# Patient Record
Sex: Female | Born: 1985
Health system: Southern US, Community
[De-identification: ages and names within clinical notes are randomized; demographics above are authoritative.]

## PROBLEM LIST (undated history)

## (undated) DIAGNOSIS — R202 Paresthesia of skin: Secondary | ICD-10-CM

## (undated) DIAGNOSIS — M329 Systemic lupus erythematosus, unspecified: Secondary | ICD-10-CM

## (undated) DIAGNOSIS — R200 Anesthesia of skin: Secondary | ICD-10-CM

## (undated) DIAGNOSIS — R51 Headache: Secondary | ICD-10-CM

## (undated) DIAGNOSIS — D069 Carcinoma in situ of cervix, unspecified: Secondary | ICD-10-CM

## (undated) DIAGNOSIS — K219 Gastro-esophageal reflux disease without esophagitis: Secondary | ICD-10-CM

## (undated) DIAGNOSIS — IMO0002 Reserved for concepts with insufficient information to code with codable children: Secondary | ICD-10-CM

## (undated) DIAGNOSIS — K8020 Calculus of gallbladder without cholecystitis without obstruction: Secondary | ICD-10-CM

## (undated) DIAGNOSIS — G35 Multiple sclerosis: Secondary | ICD-10-CM

## (undated) HISTORY — DX: Paresthesia of skin: R20.2

## (undated) HISTORY — DX: Reserved for concepts with insufficient information to code with codable children: IMO0002

## (undated) HISTORY — DX: Systemic lupus erythematosus, unspecified: M32.9

## (undated) HISTORY — DX: Anesthesia of skin: R20.0

## (undated) HISTORY — DX: Anesthesia of skin: R20.2

## (undated) HISTORY — DX: Headache: R51

---

## 2006-10-13 ENCOUNTER — Emergency Department (HOSPITAL_COMMUNITY): Admission: EM | Admit: 2006-10-13 | Discharge: 2006-10-13 | Payer: Self-pay | Admitting: Emergency Medicine

## 2006-10-15 ENCOUNTER — Emergency Department (HOSPITAL_COMMUNITY): Admission: EM | Admit: 2006-10-15 | Discharge: 2006-10-15 | Payer: Self-pay | Admitting: Emergency Medicine

## 2008-03-30 ENCOUNTER — Ambulatory Visit: Payer: Self-pay | Admitting: Family Medicine

## 2008-04-13 ENCOUNTER — Ambulatory Visit: Payer: Self-pay | Admitting: Family Medicine

## 2008-04-13 ENCOUNTER — Encounter: Payer: Self-pay | Admitting: Family Medicine

## 2008-04-13 DIAGNOSIS — R5383 Other fatigue: Secondary | ICD-10-CM

## 2008-04-13 DIAGNOSIS — L732 Hidradenitis suppurativa: Secondary | ICD-10-CM | POA: Insufficient documentation

## 2008-04-13 DIAGNOSIS — R5381 Other malaise: Secondary | ICD-10-CM | POA: Insufficient documentation

## 2008-04-17 LAB — CONVERTED CEMR LAB
BUN: 10 mg/dL (ref 6–23)
CO2: 23 meq/L (ref 19–32)
Calcium: 9.4 mg/dL (ref 8.4–10.5)
Creatinine, Ser: 0.7 mg/dL (ref 0.40–1.20)
Glucose, Bld: 97 mg/dL (ref 70–99)
HCT: 39 % (ref 36.0–46.0)
Hemoglobin: 12.8 g/dL (ref 12.0–15.0)
MCV: 85.5 fL (ref 78.0–100.0)
RBC: 4.56 M/uL (ref 3.87–5.11)
WBC: 7.4 10*3/uL (ref 4.0–10.5)

## 2009-06-25 ENCOUNTER — Emergency Department (HOSPITAL_COMMUNITY): Admission: EM | Admit: 2009-06-25 | Discharge: 2009-06-25 | Payer: Self-pay | Admitting: Emergency Medicine

## 2009-08-03 ENCOUNTER — Ambulatory Visit: Payer: Self-pay | Admitting: Family Medicine

## 2009-08-03 ENCOUNTER — Encounter: Payer: Self-pay | Admitting: Family Medicine

## 2009-08-03 DIAGNOSIS — M62838 Other muscle spasm: Secondary | ICD-10-CM | POA: Insufficient documentation

## 2009-08-08 LAB — CONVERTED CEMR LAB
AST: 13 units/L (ref 0–37)
Alkaline Phosphatase: 69 units/L (ref 39–117)
BUN: 6 mg/dL (ref 6–23)
Creatinine, Ser: 0.64 mg/dL (ref 0.40–1.20)
Folate: 10.5 ng/mL
Glucose, Bld: 97 mg/dL (ref 70–99)
Vitamin B-12: 300 pg/mL (ref 211–911)

## 2009-08-10 ENCOUNTER — Ambulatory Visit (HOSPITAL_COMMUNITY): Admission: RE | Admit: 2009-08-10 | Discharge: 2009-08-10 | Payer: Self-pay | Admitting: Gastroenterology

## 2009-08-13 ENCOUNTER — Encounter: Payer: Self-pay | Admitting: Family Medicine

## 2009-08-14 ENCOUNTER — Telehealth: Payer: Self-pay | Admitting: Family Medicine

## 2009-08-16 ENCOUNTER — Telehealth: Payer: Self-pay | Admitting: Family Medicine

## 2009-08-17 ENCOUNTER — Ambulatory Visit: Payer: Self-pay | Admitting: Family Medicine

## 2009-10-10 ENCOUNTER — Encounter: Payer: Self-pay | Admitting: Family Medicine

## 2009-10-10 ENCOUNTER — Ambulatory Visit: Payer: Self-pay | Admitting: Family Medicine

## 2009-10-10 LAB — CONVERTED CEMR LAB
ABO/RH(D): O POS
Basophils Absolute: 0 10*3/uL (ref 0.0–0.1)
Eosinophils Relative: 1 % (ref 0–5)
HCT: 34.3 % — ABNORMAL LOW (ref 36.0–46.0)
Hemoglobin: 11.6 g/dL — ABNORMAL LOW (ref 12.0–15.0)
Hepatitis B Surface Ag: NEGATIVE
Lymphocytes Relative: 32 % (ref 12–46)
Lymphs Abs: 1.9 10*3/uL (ref 0.7–4.0)
Neutro Abs: 3.3 10*3/uL (ref 1.7–7.7)
Platelets: 320 10*3/uL (ref 150–400)
Rh Type: POSITIVE
Rubella: 20.2 intl units/mL — ABNORMAL HIGH
WBC: 5.8 10*3/uL (ref 4.0–10.5)

## 2009-10-22 ENCOUNTER — Encounter: Payer: Self-pay | Admitting: Family Medicine

## 2009-10-22 ENCOUNTER — Ambulatory Visit: Payer: Self-pay | Admitting: Family Medicine

## 2009-10-22 LAB — CONVERTED CEMR LAB: GC Probe Amp, Genital: NEGATIVE

## 2009-10-24 ENCOUNTER — Encounter: Payer: Self-pay | Admitting: Family Medicine

## 2009-10-24 LAB — CONVERTED CEMR LAB

## 2009-10-25 ENCOUNTER — Ambulatory Visit: Payer: Self-pay | Admitting: Family Medicine

## 2009-11-01 ENCOUNTER — Ambulatory Visit (HOSPITAL_COMMUNITY): Admission: RE | Admit: 2009-11-01 | Discharge: 2009-11-01 | Payer: Self-pay | Admitting: Family Medicine

## 2009-11-01 ENCOUNTER — Encounter: Payer: Self-pay | Admitting: Family Medicine

## 2009-11-20 ENCOUNTER — Ambulatory Visit: Payer: Self-pay | Admitting: Family Medicine

## 2009-11-22 ENCOUNTER — Encounter: Payer: Self-pay | Admitting: Family Medicine

## 2009-12-19 ENCOUNTER — Encounter: Payer: Self-pay | Admitting: Family Medicine

## 2009-12-19 ENCOUNTER — Ambulatory Visit: Payer: Self-pay | Admitting: Family Medicine

## 2009-12-25 ENCOUNTER — Encounter: Payer: Self-pay | Admitting: Family Medicine

## 2009-12-25 ENCOUNTER — Ambulatory Visit (HOSPITAL_COMMUNITY): Admission: RE | Admit: 2009-12-25 | Discharge: 2009-12-25 | Payer: Self-pay | Admitting: Family Medicine

## 2010-01-16 ENCOUNTER — Telehealth (INDEPENDENT_AMBULATORY_CARE_PROVIDER_SITE_OTHER): Payer: Self-pay | Admitting: *Deleted

## 2010-01-18 ENCOUNTER — Ambulatory Visit: Payer: Self-pay | Admitting: Family Medicine

## 2010-01-18 LAB — CONVERTED CEMR LAB
Glucose, Urine, Semiquant: NEGATIVE
Protein, U semiquant: 30

## 2010-02-21 ENCOUNTER — Ambulatory Visit: Admission: RE | Admit: 2010-02-21 | Discharge: 2010-02-21 | Payer: Self-pay | Source: Home / Self Care

## 2010-02-21 ENCOUNTER — Encounter: Payer: Self-pay | Admitting: Family Medicine

## 2010-02-21 LAB — CONVERTED CEMR LAB
HIV: NONREACTIVE
MCV: 85.6 fL (ref 78.0–100.0)
Platelets: 237 10*3/uL (ref 150–400)
RBC: 3.83 M/uL — ABNORMAL LOW (ref 3.87–5.11)
WBC: 6.6 10*3/uL (ref 4.0–10.5)

## 2010-02-22 ENCOUNTER — Telehealth: Payer: Self-pay | Admitting: Family Medicine

## 2010-02-24 ENCOUNTER — Encounter: Payer: Self-pay | Admitting: Family Medicine

## 2010-03-05 NOTE — Letter (Signed)
Summary: Results Follow-up Letter  [facility]  [address]   [place], [place] [address]   Phone: [phone]  Fax: [phone]    10/24/2009  [address] [place], [place]  [address]  Dear Ms. [place],   The following are the results of your recent test(s):  Your Pap smear showed some mildly abnormal cells on your cervix. This does NOT affect your pregnancy, but we should follow up with colposcopy (looking at your cervix with a special camera) 6 weeks after you have your baby.    Sincerely,  Bobby Rumpf  MD [facility]           Appended Document: Results Follow-up Letter mailed.

## 2010-03-05 NOTE — Progress Notes (Signed)
Summary: phnmsg  Phone Note Call from Patient Call back at Home Phone [phone]   Caller: Patient Summary of Call: pt is returning Dr Jeanice Lim call Initial call taken by: De Nurse,  August 14, 2009 1:43 PM  Follow-up for Phone Call        Called patient back. Will have patient come in to be seen tomorrow at regularly scheduled appointment.  Follow-up by: Bobby Rumpf  MD,  August 16, 2009 9:37 AM

## 2010-03-05 NOTE — Progress Notes (Signed)
Summary: Confirmation of appointment at [facility]  Phone Note From Other Clinic   Summary of Call: Appointment confirmation for Emily Hicks 09/07/09 to be seen by Dr. Terrace Arabia

## 2010-03-05 NOTE — Miscellaneous (Signed)
Summary: Order for Neurology Referral

## 2010-03-05 NOTE — Assessment & Plan Note (Signed)
Summary: NOB/DSL   Vital Signs:  Patient profile:   25 year old female LMP:     07/30/2009 Height:      61.75 inches Weight:      183 pounds BMI:     33.86 BSA:     1.84 Temp:     98.5 degrees F Pulse rate:   101 / minute BP sitting:   132 / 81  Vitals Entered By: Jone Baseman CMA (October 22, 2009 1:42 PM) CC: NOB LMP (date): 07/30/2009 EDC by LMP==> 05/06/2010 EDC 05/06/2010 LMP - Character: normal LMP - Reliable? Yes Menarche (age onset years): 14   Menses interval (days): 21  Menstrual flow (days): 4  On BCP's at conception: no Enter LMP: 07/30/2009   CC:  NOB.  Habits & Providers  Alcohol-Tobacco-Diet     Cigarette Packs/Day: n/a  Allergies: No Known Drug Allergies  Social History: Hepatitis Risk:  no Packs/Day:  n/a  Physical Exam  General:  NAD, pleasant, obese Eyes:  pupils equal, round and reactive to light , extraoccular movements intact , normal fundi on limited exam  Mouth:  Oral mucosa and oropharynx without lesions or exudates.  Teeth in good repair. Neck:  no masses Lungs:  Normal respiratory effort, chest expands symmetrically. Lungs are clear to auscultation, no crackles or wheezes. Heart:  Normal rate and regular rhythm. S1 and S2 normal without gallop, murmur, click, rub or other extra sounds. Abdomen:  Bowel sounds positive,abdomen soft and non-tender without masses, organomegaly or hernias noted. Msk:  5/5 strength bilateral upper and lower extremities  Extremities:  no edema  Neurologic:  alert & oriented X3, cranial nerves II-XII intact, strength normal in all extremities, sensation intact to light touch, and gait normal.   2+ patellar on left, 1+ on right, no clonus  finger to nose normal Rapid alternating movements normal  Psych:  Oriented X3, memory intact for recent and remote, normally interactive, good eye contact, not anxious appearing, not depressed appearing, and not agitated.    PHQ9 score = 2   Impression &  Recommendations:  Problem # 1:  SUPERVISION OF NORMAL FIRST PREGNANCY (ICD-V22.0) Assessment New G1P0 at 12.0 weeks by certain LMP.   1) U/S for dates  2) 1 hr GTT early - obesity and AAF 3) Pap today 4) GC/CT today 5) PHQ9 score = 2; no safety issues identified; father of baby here at appointment and highly involved  6) Advised regarding breast feeding 7) Contemplating plan for contraception post partum  8) Quad screen at 16 weeks 9) U/S for anatomy 18-20 weeks 10) Addressed normal amount of weight gain (20 lbs given obesity) 11) Labs as below. 12) Follow up 4 weeks (precepted with Dr. Leveda Anna - will route to Dr. Mauricio Po) 13) Script for PNV given   PRENATAL LABS:  HIV = negative RPR = negative  Hgb = 11.6  Platelets =  Sickle cell screen = negative  ABO / Rh = O positive  Antibody screen = negative  GC / Chlamydia = pending  1 hr GTT = pending  GBS = pending  Rubella = immune  HepBsAg = negative   Orders: Prenatal U/S < 14 weeks - 69629  (Prenatal U/S) Other OB visit- [facility] (OBCK)  Problem # 2:  MUSCLE SPASM (ICD-728.85) Assessment: Improved Uncertain etiology. MRI findings, symptomatology concerning for multiple sclerosis (vs. other items listed on differential). Awaiting office visit notes from Neurology. Appreciate Dr. Zannie Cove involvement in management of this patient.  Will hold off on  further work up for now from my standpoint given resolution of symptoms at this time and pregnancy.   Orders: Sed Rate (ESR)-[facility] [id number]) [facility]- Est  Level 4 (99214)  Other Orders: GC/Chlamydia-[facility] ([id number]) Pap Smear-[facility] ([id number]) Future Orders: Glucose 1 hr-[facility] (91478) ... 10/24/2010  Patient Instructions: 1)  It was great to see you today!  2)  Congratulations! 3)  Follow up in 4 weeks. 4)  We will check an ultrasound for dates. 5)  We will check your bloog sugars today to check for gestational diabetes.  6)  Eat 4-5 small meals per day to help with nausea  7)  I will  check your notes from Neurology to see what their plan is. 8)  We can hold off on working up the muscle spasms any further during your pregnancy. 9)  TUMS is safe to take during pregnancy for heartburn and Tylenol / acetaminophen is safe to take for pain  10)  Think about plans for birth control and whether you plan on breastfeeding (I recommend that you breastfeed) 11)  IF YOU NOTICE ANY BLEEDING OR CONTRACTIONS - GO STRAIGHT TO [facility]   Prenatal Visit    FOB name: Clois Dupes [facility] Confirmation:    New working [facility]: 05/06/2010    LMP reliable? 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 : Yes    Menarche: 14 years    Menses interval: 21  days    Menstrual flow 4  days    On BCP's at conception: no    Symptoms since LMP: amenorrhea, nausea, vomiting, fatigue, irritability, urinary frequency    Other symptoms: once a day emesis   Past Pregnancy History    Gravida:     1    Term Births:     0    Premature Births:   0    Living Children:   0    Para:       0    Mult. Births:     0    Prev C-Section:   0    Prev. VBAC attempt?   none    Aborta:     0    Elect. Ab:     0    Spont. Ab:     0    Ectopics:     0   Genetic History    Genetic History Reviewed:    Father of baby:   Kadeem McGriff     Thalassemia:     mother: no   father: no    Neural tube defect:   mother: no   father: no    Down's Syndrome:   mother: no   father: no    Tay-Sachs:     mother:  no   father: no    Sickle Cell Dz/Trait:  mother: no   father: no    Hemophilia:     mother: no   father: no    Muscular Dystrophy:   mother: no   father: no    Cystic Fibrosis:   mother: no   father: no    Huntington's Dz:   mother: no   father: no    Mental Retardation:   mother: no   father: no    Fragile X:     mother: no   father: no    Other Genetic or       Chromosomal Dz:   mother: no   father: no    Child with other       birth defect:     mother: no   father: no    > 3 spont. abortions:   mother: no    Hx of stillbirth:     mother: no  Infection Risk History    High Risk Hepatitis B: no    Immunized against Hepatitis B: no    Exposure to TB: no    Patient with history of Genital Herpes: no    Sexual partner with history of Genital Herpes: no    History of STD (GC, Chlamydia, Syphilis, HPV): no    Rash, Viral, or Febrile Illness since LMP: no    Exposure to Cat Litter: no    Chicken Pox Immune Status: Hx of Disease: Immune    History of Parvovirus (Fifth Disease): no    Occupational Exposure to Children: none  Environmental Exposures    Xray Exposure since LMP: no    Chemical or other exposure: no    Medication, drug, or alcohol use since LMP: no   Flowsheet View for Follow-up Visit    Estimated weeks of       gestation:     12 0/7    Weight:     183    Blood pressure:   132 / 81    Headache:     few headaches     Nausea/vomiting:   vom1/d    Edema:     0    Vaginal bleeding:   no    Vaginal discharge:   clear     Fundal height:      < 20 weeks     FHR:       unable to detect     Fetal activity:     N/A    Labor symptoms:   no    Taking prenatal vits?   Y    Smoking:     n/a    Next visit:     4 wk    Resident:     Wallene Huh    Preceptor:     Hensel     Appended Document: NOB/DSL 122/80 repeat BP performed by Jone Baseman

## 2010-03-05 NOTE — Assessment & Plan Note (Signed)
Summary: ob   Flowsheet View for Follow-up Visit    Estimated weeks of       gestation:     19 1/7    Weight:     189    Blood pressure:   137 / 79    Hx headache?     No    Nausea/vomiting?   occasional     Edema?     0    Bleeding?     no    Leakage/discharge?   no    Fetal activity:       occasional flutters    Labor symptoms?   no    Fundal height:       ~ 20 wks    FHR:       140-150    Taking Vitamins?   Y    Smoking PPD:   n/a    Next visit:     4 wk    Resident:     Wallene Huh  Physical Examination  Vital Signs:  P: 95  BP (upright): 137/79  Wt: 189  Last Ht: 61.75 (10/22/2009)  Impression & Recommendations:  Problem # 1:  SUPERVISION OF NORMAL FIRST PREGNANCY (ICD-V22.0) Assessment Unchanged  G1P0 at 19.1 weeks by u/s at 12 weeks 2 days   1) U/S for dates results entered - new EDC of 05/14/10 assigned, EGA [redacted]w[redacted]d today (8 day discrepancy w/ LMP); o/w normal u/s  2) 1 hr GTT early = normal (130) 3) Pap with LSIL - follow 6 weeks pospartum with colposcopy (letter sent) 4) GC/CT NEGATIVE / NEGATIVE 5) PHQ9 score = 2; no safety issues identified; father of baby here at appointment and highly involved  6) Advised regarding breast feeding 7) Contemplating plan for contraception post partum  8) Quad screen today 9) U/S for anatomy 18-20 weeks - will schedule this visit 10) Addressed normal amount of weight gain (20 lbs given obesity) 11) Labs as below. 12) Follow up 4 weeks 13) PNV 14) Quad screen negative    PRENATAL LABS:  HIV = negative RPR = negative  Hgb = 11.6  Platelets =  Sickle cell screen = negative  ABO / Rh = O positive  Antibody screen = negative  GC / Chlamydia = negative / negative 1 hr GTT = 130  GBS = pending  Rubella = immune  HepBsAg = negative   Orders: Prenatal U/S > 14 weeks - 16109 (Prenatal U/S) Other OB visit- [facility] (OBCK)  Patient Instructions: 1)  It was great to see you today!  2)  Congratulations! Your new due date is  05/13/09 3)  Follow up in 4 weeks. 4)  We will schedule you for an ultrasound to get a look at your baby (you will hopefully be able to find out the gender!) 5)  Eat 4-5 small meals per day to help with nausea  6)  We can hold off on working up the muscle spasms any further during your pregnancy. 7)  TUMS is safe to take during pregnancy for heartburn and Tylenol / acetaminophen is safe to take for pain. Over the counter fiber supplements can help with constipation.  8)  Think about plans for birth control and whether you plan on breastfeeding (I recommend that you breastfeed) 9)  IF YOU NOTICE ANY BLEEDING OR CONTRACTIONS - GO STRAIGHT TO [facility]   Flowsheet View for Follow-up Visit    Estimated weeks of       gestation:  19 1/7    Weight:     189    Blood pressure:   137 / 79    Headache:     No    Nausea/vomiting:   occasional     Edema:     0    Vaginal bleeding:   no    Vaginal discharge:   no    Fundal height:       ~ 20 wks    FHR:       140-150    Fetal activity:     occasional flutters    Labor symptoms:   no    Taking prenatal vits?   Y    Smoking:     n/a    Next visit:     4 wk    Resident:     Wallene Huh   Vital Signs:  Patient profile:   25 year old female Weight:      189 pounds Pulse rate:   95 / minute BP sitting:   137 / 79  Vitals Entered By: Garen Grams LPN (December 19, 2009 10:57 AM)

## 2010-03-05 NOTE — Assessment & Plan Note (Signed)
Summary: f/u eo   Vital Signs:  Patient profile:   25 year old female Height:      61.75 inches Weight:      196 pounds BMI:     36.27 Temp:     98.3 degrees F oral Pulse rate:   94 / minute BP sitting:   118 / 91  (left arm) Cuff size:   regular  Vitals Entered By: Garen Grams LPN (August 17, 2009 9:45 AM) CC: f/u mri Is Patient Diabetic? No Pain Assessment Patient in pain? no        Primary Care Provider:  Bobby Rumpf  MD  CC:  f/u mri.  History of Present Illness: 1) Left sided "spasms": Patient continues to have left sided spasms x 2 months, as well as left leg parathesias and left facial weakness; these episodes are increasing in frequency to about 4-5 per day since the last time she saw me. MRI has demostrated scattered foci of abnormal FLAIR and T2 signal within the cerebral hemispheric white matter concerning for an early manifestation of multiple sclerosis.  vs small vessel infarctions due to vasculitis vs migraine related foci vs post-traumatic foci.  Started acutely without inciting event. She continues to have tingling and burning sensation in left thigh as well as "hand, fingers and toes locking up on left side" and "spasms in left thigh and left arm". Muscles feels sore after "locking up". Thinks that they might be temporally related to increased activity as they tend to occur after - "walking fast, typing fast", though can occur at rest as well. Continues to have headaches as well, though not always temporally related to her other symptoms. Denies vision change, increased stress, depressive symptoms, drug use, bowel or bladder changes, weakness, loss of consciousness, trauma, migraine history, family history of seizure disorder, neurological disorder, muscle disorder, autoimmune disorder  Habits & Providers  Alcohol-Tobacco-Diet     Tobacco Status: never  Current Medications (verified): 1)  None  Allergies (verified): No Known Drug Allergies  Review of  Systems       as per HPI o/w negative   Physical Exam  General:  NAD, pleasant, obese Eyes:  pupils equal, round and reactive to light , extraoccular movements intact , normal fundi on limited exam  Neck:  no masses Msk:  5/5 strength bilateral upper and lower extremities  Neurologic:  alert & oriented X3, cranial nerves II-XII intact, strength normal in all extremities, sensation intact to light touch, and gait normal.   2+ patellar on left, 1+ on right, no clonus  finger to nose normal Rapid alternating movements normal    Impression & Recommendations:  Problem # 1:  MUSCLE SPASM (ICD-728.85) Assessment Unchanged Uncertain etiology. MRI findings, symptomatology concerning for multiple sclerosis (vs other items on differential listed above). Discussed this with patient but advised her that we do not have a definitive diagnosis at this time and that we will have her see the neurologist to further evaluate these issues. Appreciate Dr. Zannie Cove involvement in the care of this patient.  Will check ESR today w/ vasculitis on differential for MRI. No spasticity noted on exam (though relatively hyper-reflexic left lower extremity as compared to right). Would consider Baclofen if spasms continue to be an issue. I reviewed the MRI findings myself as well (MRI of spine could not be performed at this time without neurology referral due to insurance issues.). total time spent with patient > 25 minutes reviewing MRI findings, differential diagnosis, plan.   Orders:  Sed Rate (ESR)-[facility] [id number]) [facility]- Est  Level 4 (13244)  Patient Instructions: 1)  It was great to see you today!  2)  Follow up in 1 month after you are seen by Neurologist. 3)  Your appointment with with [facility] on 09/07/09 at 10:25 AM with Dr. Terrace Arabia.  4)  Please keep this appointment so we can get to the bottom of what is causing these spasms  Laboratory Results   Blood Tests   Date/Time Received: August 17, 2009  10:28 AM  Date/Time Reported: August 17, 2009 11:54 AM   SED rate: 51 mm/hr  Comments: ...........test performed by...........Marland KitchenTerese Door, CMA      Appended Document: f/u eo fax to  Dr. Terrace Arabia of [facility]

## 2010-03-05 NOTE — Assessment & Plan Note (Signed)
Summary: OB 15.0 weeks/KH   Vital Signs:  Patient profile:   25 year old female Weight:      183.6 pounds Pulse rate:   93 / minute BP sitting:   128 / 72  Vitals Entered By: Garen Grams LPN (November 20, 2009 9:02 AM)  Habits & Providers  Alcohol-Tobacco-Diet     Cigarette Packs/Day: n/a  Allergies: No Known Drug Allergies   Impression & Recommendations:  Problem # 1:  SUPERVISION OF NORMAL FIRST PREGNANCY (ICD-V22.0)  Orders: AFP/Quad Scr-[facility] ([id number]) Other OB visit- [facility] (OBCK)  G1P0 at 15.0 weeks by u/s at 12 weeks 2 days   1) U/S for dates results entered - new EDC of 05/14/10 assigned, EGA [redacted]w[redacted]d today (8 day discrepancy w/ LMP); o/w normal u/s  2) 1 hr GTT early = normal 3) Pap with LSIL - follow 6 weeks pospartum with colposcopy (letter sent) 4) GC/CT NEGATIVE / NEGATIVE 5) PHQ9 score = 2; no safety issues identified; father of baby here at appointment and highly involved  6) Advised regarding breast feeding 7) Contemplating plan for contraception post partum  8) Quad screen today 9) U/S for anatomy 18-20 weeks 10) Addressed normal amount of weight gain (20 lbs given obesity) 11) Labs as below. 12) Follow up 4 weeks 13) PNV   PRENATAL LABS:  HIV = negative RPR = negative  Hgb = 11.6  Platelets =  Sickle cell screen = negative  ABO / Rh = O positive  Antibody screen = negative  GC / Chlamydia = negative / negative 1 hr GTT = 130  GBS = pending  Rubella = immune  HepBsAg = negative   Problem # 2:  MUSCLE SPASM (ICD-728.85) Assessment: Improved Uncertain etiology. MRI findings, symptomatology concerning for multiple sclerosis (vs. other items listed on differential). Awaiting office visit notes from Neurology. Appreciate Dr. Zannie Cove involvement in management of this patient.  Will hold off on further work up for now from my standpoint given resolution of symptoms at this time and pregnancy.   Patient Instructions: 1)  It was great to see you today!   2)  Congratulations! Your new due date is 05/13/09 3)  Follow up in 4 weeks. 4)  We will check an ultrasound in three to four weeks 5)  Eat 4-5 small meals per day to help with nausea  6)  We can hold off on working up the muscle spasms any further during your pregnancy. 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    Number of children at home: 0    Hospital of delivery: [facility]   FOB Information    Husband/Father of baby: Clois Dupes    FOB occupation Office clerk   Menstrual History    LMP (date): 07/30/2009    LMP - Character: normal    Menarche: 14 years    Menses interval: 21  days  Menstrual flow 4  days    On BCP's at conception: no   Flowsheet View for Follow-up Visit    Estimated weeks of       gestation:     15 0/7    Weight:     183.6    Blood pressure:   128 / 72    Headache:     occasional    Nausea/vomiting:   No    Edema:     0    Vaginal bleeding:   no    Vaginal discharge:   clear     Fundal height:      < 20 weeks    FHR:       150-160    Fetal activity:     N/A    Labor symptoms:   no    Taking prenatal vits?   Y    Smoking:     n/a    Next visit:     4 wk    Resident:     Emmit [facility]   Ultrasound Date:     11/01/2009   Indication:       Uncert dates   Adnexa by [place]:     Normal         Fetal number:       1              Fetal anomalies:     None noted     Placenta previa?     No           Measurements:    Crown-Rump Length (mm):   58.8         Dating:   Gest age by [place]:     12W 2D         EDC by [place] Gest age:     05/14/2010   Impression:   Impression:        Single living intrauterine pregnancy demonstrating an EGA by  CRL of 12w 2d. This is behind expected EGA by LMP of  13w  3d and suggests best dating by today's exam. Best Working [facility]:    05/14/2010

## 2010-03-05 NOTE — Assessment & Plan Note (Signed)
Summary: spasms on side,tcb   Vital Signs:  Patient profile:   25 year old female Height:      61.75 inches Weight:      196.1 pounds BMI:     36.29 Temp:     97.9 degrees F oral Pulse rate:   97 / minute BP sitting:   125 / 82  (left arm) Cuff size:   regular  Vitals Entered By: Garen Grams LPN (August 03, 5364 9:42 AM) CC: frequent muscle spasms on left side Is Patient Diabetic? No Pain Assessment Patient in pain? no        Primary Care Provider:  Bobby Rumpf  MD  CC:  frequent muscle spasms on left side.  History of Present Illness: 1) Left sided "spasms": Patient reports left sided spasms x 2 months. Started acutely without inciting event. Starts off with tingling and burning sensation in left thigh, progresses to "hand, fingers and toes locking up on left side" and "spasms in left thigh and left arm". Increasing in frequency - intially every 2-3 days now occuring three times a day, every day. Symptoms progressing to left side of mouth as well. Muscles feels sore after "locking up". Sometimes brought on with increased activity - "walking fast, typing fast". Also reports headaches this week - sometimes associated with these symptoms. Denies vision change, increased stress, depressive symptoms, drug use, bowel or bladder changes, weakness, loss of consciousness, trauma, family history of seizure disorder, neurological disorder, muscle disorder. Seen at [facility] ER in interim, normal Upreg, ISTAT - diagnosed with muscle spasms - given Flexeril which did not help.   Habits & Providers  Alcohol-Tobacco-Diet     Tobacco Status: never  Current Medications (verified): 1)  None  Allergies (verified): No Known Drug Allergies  Past History:  Family History: Last updated: 03/30/2008 Family History Hypertension - mother age 36 Diabetes- grandparents age- 109  Social History: Last updated: 03/30/2008 Moved to [place] 2 years ago for her job.  She works as an Airline pilot at  [employer].  She lives with her 2 brothers Caryn Bee and Jess Barters.  She participates in church activities for fun.  Risk Factors: Exercise: yes (03/30/2008)  Risk Factors: Smoking Status: never (08/03/2009)  Review of Systems       also positive for mild hair thinning, dry skin, weight gain since last visit o/w negative except as per HPI   Physical Exam  General:  NAD, pleasant, obese Eyes:  pupils equal, round and reactive to light , extraoccular movements intact , normal fundi on limited exam  Neck:  no masses Lungs:  Normal respiratory effort, chest expands symmetrically. Lungs are clear to auscultation, no crackles or wheezes. Heart:  Normal rate and regular rhythm. S1 and S2 normal without gallop, murmur, click, rub or other extra sounds. Msk:  5/5 strength bilateral upper and lower extremities  Extremities:  no edema  Neurologic:  alert & oriented X3, cranial nerves II-XII intact, strength normal in all extremities, sensation intact to light touch, and gait normal.   2+ patellar on left, 1+ on right  finger to nose normal Rapid alternating movements normal Unable to blink rapidly - eyelids twitch and flutter slightly    Impression & Recommendations:  Problem # 1:  MUSCLE SPASM (ICD-728.85) Assessment New Uncertain etiology. Will check labs as below. Check MRI brain, spinal cord with concern for progressive apparently neurological symptoms in young female. Also consider psychological etiology - PHQ9 test score = 2, patient denies stress,  appears to have good insight. Does not appear to be NMJ disorder. Will follow in two weeks. Consider neurology referral if imaging, labs do not reveal cause. Counseled regarding driving - if symptoms continue to progress, patient is to stop.   Orders: Comp Met-[facility] [phone]) B12-[facility] [phone]) Folate-[facility] [phone]) TSH-[facility] [phone]) MRI with & without Contrast (MRI w&w/o Contrast) [facility]- Est  Level 4  (28413)  Patient Instructions: 1)  Follow up in two weeks. 2)  If you notice your spasms become worse and interfere with driving, you should stop driving  Appended Document: spasms on side,tcb   Appended Document: spasms on side,tcb Will refer to Neurology for evaluation of this 25 year old female with 2 month history of left sided muscle spasm, left sided parasthesias, MRI concerning for early multiple sclerosis vs (less likely vasculitis vs migraine).

## 2010-03-07 ENCOUNTER — Encounter (INDEPENDENT_AMBULATORY_CARE_PROVIDER_SITE_OTHER): Payer: 59 | Admitting: Family Medicine

## 2010-03-07 ENCOUNTER — Ambulatory Visit: Admit: 2010-03-07 | Payer: Self-pay

## 2010-03-07 ENCOUNTER — Encounter: Payer: Self-pay | Admitting: Family Medicine

## 2010-03-07 DIAGNOSIS — Z3400 Encounter for supervision of normal first pregnancy, unspecified trimester: Secondary | ICD-10-CM

## 2010-03-07 NOTE — Assessment & Plan Note (Signed)
Summary: OB VISIT 1 MOS /RH   Vital Signs:  Patient profile:   25 year old female Height:      61.75 inches Weight:      200 pounds Temp:     98.7 degrees F oral Pulse rate:   101 / minute Pulse rhythm:   regular BP sitting:   132 / 83  (left arm) Cuff size:   regular  Vitals Entered By: Loralee Pacas CMA (February 21, 2010 9:40 AM) CC: ob Is Patient Diabetic? No Pain Assessment Patient in pain? no        CC:  ob.  Habits & Providers  Alcohol-Tobacco-Diet     Tobacco Status: never     Cigarette Packs/Day: n/a  Exercise-Depression-Behavior     Have you felt down or hopeless? no     Have you felt little pleasure in things? no     Depression Counseling: not indicated; screening negative for depression     Seat Belt Use: always  Allergies: No Known Drug Allergies  Social History: Risk analyst Use:  always   Impression & Recommendations:  Problem # 1:  SUPERVISION OF NORMAL FIRST PREGNANCY (ICD-V22.0) Patient here for OB clinic visit, EGA [redacted]w[redacted]d by 1st trimester [place] (8 days' discordance from LMP, dating by 12.2wk [place]). Reports resolution of headaches, no abd pains, no swelling.  Drinks fruit punch as beverage of choice.  Feels good fetal movement.  Discussed weight gain (has gained 8# in 5 weeks), as well as signs/symptoms of preeclampsia in light of BP in low 130s through pregnancy.  Passed 1hGTT today.  Of note, fundal height approx 3cm greater than EGA (was slightly behind at last visit).  If discordance increases in 2 weeks, consider repeat US for size>dates.  Followup 2 weeks.  Patient plans to bring infant son to [facility] pediatric care.  Orders: Glucose 1 hr-[facility] (82950) CBC-[facility] (16109) HIV-[facility] ([id number]) RPR-[facility] ([phone]) Other [facility] visit- [facility])  Patient Instructions: 1)  It was a pleasure to see you today in OB clinic!   2)  Today we are drawing your glucose challenge test, as well as routine labs for this stage of pregnancy (repeat CBC, HIV and  syphilis testing).  3)  Please be in touch with [place] if you are having returning headaches, changes in vision (blurred vision), abdominal pain, decrease in baby movement, or other problems. 4)  Please schedule your next OB visit with Dr Wallene Huh in 2 weeks.  We are glad you plan to bring baby to our practice!   Orders Added: 1)  Glucose 1 hr-[facility] [82950] 2)  CBC-[facility] [85027] 3)  HIV-[facility] [[id number]] 4)  RPR-[facility] [[id number]] 5)  Other OB visit- [facility] [[facility]]      Flowsheet View for Follow-up Visit    Estimated weeks of       gestation:     28 2/7    Weight:     200    Blood pressure:   132 / 83    Headache:     No    Nausea/vomiting:   No    Edema:     0    Vaginal bleeding:   no    Vaginal discharge:   no    Fundal height:      30.5    FHR:       130    Fetal activity:     yes    Labor symptoms:   no    Smoking:  n/a    Next visit:     2 wk    Preceptor:     Montel [place] for Follow-up Visit    Estimated weeks of       gestation:     28 2/7    Weight:     200    Blood pressure:   132 / 83    Hx headache?     No    Nausea/vomiting?   No    Edema?     0    Bleeding?     no    Leakage/discharge?   no    Fetal activity:       yes    Labor symptoms?   no    Fundal height:      30.5    FHR:       130    Smoking PPD:   n/a    Next visit:     2 wk    Preceptor:     Mauricio Po   Appended Document: OB VISIT 1 MOS /RH PMH risk screen tool applied: no red flags.

## 2010-03-07 NOTE — Assessment & Plan Note (Signed)
Summary: OB/MJ   Vital Signs:  Patient profile:   25 year old female Weight:      192.6 pounds Pulse rate:   103 / minute BP sitting:   130 / 86  Vitals Entered By: Garen Grams LPN (January 18, 2010 2:11 PM)  Habits & Providers  Alcohol-Tobacco-Diet     Cigarette Packs/Day: n/a  Allergies: No Known Drug Allergies   Impression & Recommendations:  Problem # 1:  SUPERVISION OF NORMAL FIRST PREGNANCY (ICD-V22.0) Assessment Unchanged G1P0 at 23.3 weeks by u/s at 12 weeks 2 days   1) U/S for dates results entered - EDC of 05/14/10 assigned, EGA [redacted]w[redacted]d today (8 day discrepancy w/ LMP); o/w normal u/s  2) 1 hr GTT early = normal (130) 3) Pap with LSIL - follow 6 weeks pospartum with colposcopy (letter sent) 4) GC/CT NEGATIVE / NEGATIVE 5) PHQ9 score = 2; no safety issues identified; father of baby here at appointment and highly involved  6) Advised regarding breast feeding 7) Contemplating plan for contraception post partum  8) Quad screen normal 9) U/S for anatomy wnl, female  10) Addressed normal amount of weight gain (20 lbs given obesity) 11) UA glucose / protein today 12) Follow up 4 weeks w/ OB Clinic  13) PNV 14) Red flags that would prompt return to care were reviewed with patient and patient expressed understanding  15) Will need 28 week labs (CBC, RPR, HIV, 1 hr GTT)   Orders: UA Glucose/Protein-[facility] (81002) Other OB visit- [facility] ([facility])  Patient Instructions: 1)  It was great to see you today!  2)  Congratulations! Your new due date is 05/13/09 3)  Follow up in 4 weeks with the OB Clinic 4)  Eat 4-5 small meals per day to help with nausea  5)  We can hold off on working up the muscle spasms any further during your pregnancy. 6)  TUMS is safe to take during pregnancy for heartburn and Tylenol / acetaminophen is safe to take for pain. Over the counter fiber supplements can help with constipation.  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    Number of children at home: 0    [facility] delivery: [facility]   FOB Information    Husband/Father of baby: Clois Dupes    FOB occupation Office clerk   Menstrual History    LMP (date): 07/30/2009    LMP - Character: normal    Menarche: 14 years    Menses interval: 21  days    Menstrual flow 4  days    On BCP's at conception: no   Flowsheet View for Follow-up Visit    Estimated weeks of       gestation:     23 3/7    Weight:     192.6    Blood pressure:   130 / 86    Urine Protein:     30    Urine Glucose:   negative    Headache:     few  with URI symptoms     Nausea/vomiting:   none     Edema:     0    Vaginal bleeding:   no    Vaginal discharge:   no    Fundal height:      22 wks     FHR:       140-150    Fetal activity:     few flutters    Labor symptoms:   no    Taking prenatal vits?   Y    Smoking:     n/a    Next visit:     4 weeks w/ OB Clinic    Resident:     [facility]     Laboratory Results   Urine Tests  Date/Time Received: January 18, 2010 2:04 PM  Date/Time Reported: January 18, 2010 2:30 PM   Routine Urinalysis   Glucose: negative   (Normal Range: Negative) Protein: 30   (Normal Range: Negative)    Comments: ...........test performed by...........Marland KitchenTerese Door, CMA

## 2010-03-07 NOTE — Progress Notes (Signed)
Summary: Triage  Phone Note Call from Patient Call back at Home Phone [phone]   Reason for Call: Talk to Nurse Summary of Call: wants to know what she can take for a cold, pt is pregnant Initial call taken by: Knox Royalty,  January 16, 2010 8:45 AM  Follow-up for Phone Call        patient states she has cough with some Szczygiel sputum , chest congestion, sneezing . comsulted dr. [place] and she advises cool mist humifier , saline nasal  spray . cough drops she may use and if absolutely necessary may try robitussin plain. if she develops fever needs to be seen or if symptoms not going away. Follow-up by: Theresia Lo RN,  January 16, 2010 9:25 AM

## 2010-03-07 NOTE — Progress Notes (Signed)
  Phone Note Outgoing Call Call back at [facility] Phone [phone]   Call placed by: Paula Compton MD,  February 22, 2010 9:09 AM Call placed to: Patient Action Taken: Phone Call Completed Summary of Call: Called and spoke with patient about her lab results (negative HIV and RPR; CBC appropriate for pregnancy).  She voices understanding. Initial call taken by: Paula Compton MD,  February 22, 2010 9:10 AM

## 2010-03-13 NOTE — Assessment & Plan Note (Signed)
Summary: ob visit/eo         Today's Evaluation EGA: [redacted]W[redacted]D  Fundal Ht: 31.5  FHT: 140  WEIGHT: 200  BP: 119/78 Edema: 0  Fetal Activity: yes   Progress Note(s): No concerns.  Problems Assessed Assessed SUPERVISION OF NORMAL FIRST PREGNANCY as unchanged - Helane Rima DO  Allergies NKA               Flowsheet View for Follow-up Visit    Estimated weeks of       gestation:     [redacted]W[redacted]D    Weight:     200    Blood pressure:   119 / 78    Headache:     No    Nausea/vomiting:   No    Edema:     0    Vaginal bleeding:   no    Vaginal discharge:   no    Fundal height:      31.5    FHR:       140    Fetal activity:     yes    Labor symptoms:   no    Taking prenatal vits?   Y    Smoking:     n/a    Next visit:     2 wk    Resident:     Earlene Plater    Comment:     No concerns.   Flowsheet View for Follow-up Visit    Estimated weeks of       gestation:     [redacted]W[redacted]D    Weight:     200    Blood pressure:   119 / 78    Hx headache?     No    Nausea/vomiting?   No    Edema?     0    Bleeding?     no    Leakage/discharge?   no    Fetal activity:       yes    Labor symptoms?   no    Fundal height:      31.5    FHR:       140    Taking Vitamins?   Y    Smoking PPD:   n/a    Comment:     No concerns.    Next visit:     2 wk    Resident:     Earlene Plater    Impression & Recommendations:  Problem # 1:  SUPERVISION OF NORMAL FIRST PREGNANCY (ICD-V22.0) Assessment Unchanged  G1P0 at 30.2 weeks by [place] at 12 weeks 2 days. No weight gain since last visit. Measuring normally. 28 weeks labs done, Hgb 10.7.  1) [place] for dates results entered - EDC of 05/14/10 assigned 2) 1 hr GTT early = normal (130), Second = 126 3) Pap with LSIL - follow 6 weeks pospartum with colposcopy (letter sent) 4) GC/CT NEGATIVE / NEGATIVE 5) PHQ9 score = 2; no safety issues identified; father of baby here at appointment and highly involved  6) Advised regarding breast feeding 7) Contemplating plan for  contraception post partum  8) Quad screen normal 9) U/S for anatomy wnl, female  10) Addressed normal amount of weight gain (20 lbs given obesity) 12) Seen in OB Clinic 13) PNV 14) Red flags that would prompt return to care were reviewed with patient and patient expressed understanding  15) Follow up 2 weeks  Orders: Other [facility] visit- [facility])   Patient Instructions: 1)  It was a pleasure to see you today! 2)  Please be in touch with [place] if you are having returning headaches, changes in vision (blurred vision), abdominal pain, decrease in baby movement, or other problems. 3)  Please schedule your next OB visit with Dr Wallene Huh in 2 weeks.  We are glad you plan to bring baby to our practice! 4)  IF YOU NOTICE ANY BLEEDING OR CONTRACTIONS - GO STRAIGHT TO [facility].

## 2010-03-21 ENCOUNTER — Ambulatory Visit: Payer: 59 | Admitting: Family Medicine

## 2010-03-21 ENCOUNTER — Encounter: Payer: Self-pay | Admitting: Family Medicine

## 2010-03-21 ENCOUNTER — Ambulatory Visit (INDEPENDENT_AMBULATORY_CARE_PROVIDER_SITE_OTHER): Payer: 59 | Admitting: Family Medicine

## 2010-03-21 VITALS — BP 120/78 | Wt 204.0 lb

## 2010-03-21 DIAGNOSIS — Z3400 Encounter for supervision of normal first pregnancy, unspecified trimester: Secondary | ICD-10-CM

## 2010-03-21 LAB — POCT UA - GLUCOSE/PROTEIN
Glucose, UA: NEGATIVE
Protein, UA: NEGATIVE

## 2010-03-21 NOTE — Patient Instructions (Signed)
Take 2 Tylenol for your headache every 6-8 hours as needed.  If your headache does not resolve with Tylenol, I would call back or head over to MAU (Maternity Admissions Unit) at [facility]. If you have any rib pain, rapidly increasing swelling, or vision changes head to MAU. If you have any vaginal bleeding, loss of fluid, or contractions call or head to MAU. Think about birth control and breast vs bottle feeding for next visit.  Come back in 2 weeks. It was good to meet you!

## 2010-03-21 NOTE — Progress Notes (Signed)
25 yo G1 at 32.[redacted] weeks GA by 2nd trimester ultrasound (at 12.2 weeks) performed on 11/01/09.   LMP 07/30/09 -->  8 days difference b/n LMP and ultrasound means we will use Ultrasound dates as best working EDD:  05/14/10 I believe patient is under the impression that due date is 05/06/10, which would be the date if we went by LMP.   I worked out her dating after she had already left for clinic.  Precepted with Dr. Mauricio Po who agreed we need to use [place] dating.   We need to discuss with her actual date of EDD at next visit.    Otherwise, patient doing well today, only complaining of headache which started last night and continued through to this AM. Initially I was very concerned with elevated BP reading of 140 + headache, however on my recheck her blood pressure was improved to 120/78.  I believe her initial reading (when she was first brought back) was elevated due to her headache and not vice versa.   Gave strict warnings and red flags about vision changes or if headache not improving both verbal and written.   Patient is with PCP of Dr. Wallene Huh, however, he is out of office for a while and therefore has seen several different physicians.  This is likely confusing to her and we discussed this today. Name of baby:  Bary Castilla.  Will place this in Sticky Note on chart.

## 2010-03-25 ENCOUNTER — Encounter: Payer: 59 | Admitting: Family Medicine

## 2010-04-03 ENCOUNTER — Encounter: Payer: 59 | Admitting: Family Medicine

## 2010-04-05 ENCOUNTER — Ambulatory Visit (INDEPENDENT_AMBULATORY_CARE_PROVIDER_SITE_OTHER): Payer: 59 | Admitting: Family Medicine

## 2010-04-05 DIAGNOSIS — Z3480 Encounter for supervision of other normal pregnancy, unspecified trimester: Secondary | ICD-10-CM

## 2010-04-05 NOTE — Patient Instructions (Signed)
It was great to see you today!   Congratulations!  Follow up in 2 weeks with me My pager number is [phone] - have the [facility] page me if you get admitted in labor  Eat 4-5 small meals per day  We can hold off on working up the muscle spasms any further during your pregnancy. TUMS is safe to take during pregnancy for heartburn and Tylenol / acetaminophen is safe to take for pain. Over the counter fiber supplements can help with constipation.   Think about plans for birth control IF YOU NOTICE ANY BLEEDING, LEAKING FLUID  OR CONTRACTIONS THAT OCCUR EVERY 5 - 10 minutes - GO STRAIGHT TO [facility]

## 2010-04-05 NOTE — Progress Notes (Signed)
  Subjective:    Patient ID: Emily Hicks, female    DOB: Jan 27, 1986, 25 y.o.   MRN: [id number]  HPI    Review of Systems     Objective:   Physical Exam        Assessment & Plan:  1) EDC of 05/14/10 assigned based on ultrasound 2) Pap with LSIL - follow 6 weeks pospartum with colposcopy (letter sent) 3) No safety issues identified; father of baby highly involved  4) Advised regarding breast feeding - patient plans to breast feed 5) Contemplating plan for contraception post partum  6) Addressed normal amount of weight gain (20 lbs given obesity) 7) Follow up 2 weeks - check 36 week labs - GBS, GC/CT  8) PNV 9) Quad screen negative  10) Normal anatomy on U/S  11) Red flags that would prompt return to care were reviewed with patient and patient expressed understanding    PRENATAL LABS:  HIV = negative RPR = negative  Hgb = 11.6  Sickle cell screen = negative  ABO / Rh = O positive  Antibody screen = negative  GC / Chlamydia = negative / negative 1 hr GTT = 130  GBS = pending  Rubella = immune  HepBsAg = negative

## 2010-04-18 ENCOUNTER — Ambulatory Visit (INDEPENDENT_AMBULATORY_CARE_PROVIDER_SITE_OTHER): Payer: 59 | Admitting: Family Medicine

## 2010-04-18 ENCOUNTER — Other Ambulatory Visit: Payer: Self-pay | Admitting: Family Medicine

## 2010-04-18 VITALS — BP 126/80 | Wt 218.4 lb

## 2010-04-18 DIAGNOSIS — Z3480 Encounter for supervision of other normal pregnancy, unspecified trimester: Secondary | ICD-10-CM

## 2010-04-18 DIAGNOSIS — N898 Other specified noninflammatory disorders of vagina: Secondary | ICD-10-CM | POA: Insufficient documentation

## 2010-04-18 DIAGNOSIS — IMO0001 Reserved for inherently not codable concepts without codable children: Secondary | ICD-10-CM

## 2010-04-18 DIAGNOSIS — R030 Elevated blood-pressure reading, without diagnosis of hypertension: Secondary | ICD-10-CM

## 2010-04-18 DIAGNOSIS — N760 Acute vaginitis: Secondary | ICD-10-CM

## 2010-04-18 LAB — POCT URINALYSIS DIPSTICK
Blood, UA: NEGATIVE
Glucose, UA: NEGATIVE
Protein, UA: NEGATIVE
Spec Grav, UA: 1.02
Urobilinogen, UA: 0.2
pH, UA: 7

## 2010-04-18 LAB — CONVERTED CEMR LAB: GC Probe Amp, Genital: NEGATIVE

## 2010-04-18 LAB — POCT UA - MICROSCOPIC ONLY

## 2010-04-18 LAB — POCT WET PREP (WET MOUNT): Trichomonas Wet Prep HPF POC: NEGATIVE

## 2010-04-18 LAB — GLUCOSE, CAPILLARY: Glucose-Capillary: 130 mg/dL — ABNORMAL HIGH (ref 70–99)

## 2010-04-18 NOTE — Progress Notes (Signed)
Addended byRenold Don on: 04/18/2010 04:34 PM   Modules accepted: Orders

## 2010-04-18 NOTE — Telephone Encounter (Signed)
Spoke with patient and informed her of the message. She said to call her back on the mobile number, she states that [phone] is correct number -Emily Hicks

## 2010-04-18 NOTE — Progress Notes (Addendum)
25 yo G1 at 36.[redacted] weeks GA by 2nd trimester ultrasound (at 12.2 weeks) performed on 11/01/09.   Obtained GBS today, GC/Chlamydia.   Patient has complained of some vaginal itching. Prenatal labs reviewed, no concerns.   Follow-up PAP after delivery.      Pelvic exam performed today.  GYN:  External genitalia within normal limits.  Vaginal mucosa pink, moist, normal rugae.  Nonfriable cervix without lesions.  Diffuse, white discharge noted throughout vaginal vault.  No blood.  Bimanual exam revealed gravid uterus, size consistent with dates.  No cervical motion tenderness. No adnexal masses bilaterally.    Repeat blood pressure was WNL.  No headaches, vision changes, RUQ pain.  To fu in 1 week with Dr. Wallene Huh PCP.

## 2010-04-18 NOTE — Telephone Encounter (Signed)
Patient with BV and yeast found on wet prep.  Plan to treat with Metronidazole and Monistat.  Called and left message with patient at work as mobile number has been disconnected.

## 2010-04-18 NOTE — Patient Instructions (Addendum)
I will call you with the results of the yeast test today. The results of the Group B Strep (GBS) won't come back for a day or so.  Again, all that means is that you may get antibiotics when you go to deliver.   Follow up in 2 weeks with Dr. Wallene Huh.    Think about plans for birth control  IF YOU NOTICE ANY BLEEDING, LEAKING FLUID OR CONTRACTIONS THAT OCCUR EVERY 5 - 10 minutes - GO STRAIGHT TO [facility]

## 2010-04-19 LAB — GC/CHLAMYDIA PROBE AMP, GENITAL: Chlamydia, DNA Probe: NEGATIVE

## 2010-04-19 MED ORDER — METRONIDAZOLE 500 MG PO TABS: 500.0000 mg | ORAL_TABLET | Freq: Two times a day (BID) | ORAL | Status: AC

## 2010-04-19 NOTE — Telephone Encounter (Signed)
Called and asked pt what pharmacy she uses; [facility] [address]  Called Rx in for pt.Loralee Pacas [place]

## 2010-04-19 NOTE — Telephone Encounter (Signed)
Called in by nurse.  Appreciate care and help.

## 2010-04-20 LAB — STREP B DNA PROBE: GBSP: POSITIVE

## 2010-04-22 LAB — URINALYSIS, ROUTINE W REFLEX MICROSCOPIC
Bilirubin Urine: NEGATIVE
Glucose, UA: NEGATIVE mg/dL
Ketones, ur: NEGATIVE mg/dL
Nitrite: NEGATIVE
Protein, ur: NEGATIVE mg/dL
pH: 6 (ref 5.0–8.0)

## 2010-04-22 LAB — D-DIMER, QUANTITATIVE: D-Dimer, Quant: 0.34 ug/mL-FEU (ref 0.00–0.48)

## 2010-04-22 LAB — POCT I-STAT, CHEM 8
BUN: 5 mg/dL — ABNORMAL LOW (ref 6–23)
Chloride: 102 mEq/L (ref 96–112)
HCT: 36 % (ref 36.0–46.0)
Potassium: 3.9 mEq/L (ref 3.5–5.1)

## 2010-04-22 LAB — URINE MICROSCOPIC-ADD ON

## 2010-04-22 LAB — POCT PREGNANCY, URINE: Preg Test, Ur: NEGATIVE

## 2010-04-25 ENCOUNTER — Ambulatory Visit (INDEPENDENT_AMBULATORY_CARE_PROVIDER_SITE_OTHER): Payer: 59 | Admitting: Family Medicine

## 2010-04-25 VITALS — BP 130/78 | Wt 221.0 lb

## 2010-04-25 DIAGNOSIS — Z3400 Encounter for supervision of normal first pregnancy, unspecified trimester: Secondary | ICD-10-CM | POA: Insufficient documentation

## 2010-04-25 DIAGNOSIS — Z331 Pregnant state, incidental: Secondary | ICD-10-CM

## 2010-04-25 LAB — COMPREHENSIVE METABOLIC PANEL
ALT: 11 U/L (ref 0–35)
AST: 14 U/L (ref 0–37)
Albumin: 3.1 g/dL — ABNORMAL LOW (ref 3.5–5.2)
BUN: 7 mg/dL (ref 6–23)
Calcium: 9 mg/dL (ref 8.4–10.5)
Chloride: 106 mEq/L (ref 96–112)
Potassium: 3.7 mEq/L (ref 3.5–5.3)
Sodium: 137 mEq/L (ref 135–145)
Total Protein: 6.2 g/dL (ref 6.0–8.3)

## 2010-04-25 LAB — POCT UA - GLUCOSE/PROTEIN

## 2010-04-25 LAB — CBC
HCT: 32.2 % — ABNORMAL LOW (ref 36.0–46.0)
Hemoglobin: 10.4 g/dL — ABNORMAL LOW (ref 12.0–15.0)
RDW: 14.6 % (ref 11.5–15.5)
WBC: 6.5 10*3/uL (ref 4.0–10.5)

## 2010-04-25 NOTE — Progress Notes (Signed)
Addended by: [place], SARAH on: 04/25/2010 04:20 PM   Modules accepted: Orders

## 2010-04-25 NOTE — Patient Instructions (Signed)
Follow up with me in one week. It was great to see you today! - Dr. Wallene Huh   Pregnancy If you are planning on getting pregnant, it is a good idea to make a preconception appointment with your care- giver to discuss having a healthy lifestyle before getting pregnant. Such as, diet, weight, exercise, taking prenatal vitamins especially folic acid (it helps prevent brain and spinal cord defects), avoiding alcohol, smoking and illegal drugs, medical problems (diabetes, convulsions), family history of genetic problems, working conditions and immunizations. It is better to have knowledge of these things and do something about them before getting pregnant. In your pregnancy, it is important to follow certain guidelines to have a healthy baby. It is very important to get good prenatal care and follow your caregiver's instructions. Prenatal care includes all the medical care you receive before your baby's birth. This helps to prevent problems during the pregnancy and childbirth. HOME CARE INSTRUCTIONS  Start your prenatal visits by the 12th week of pregnancy or before when possible. They are usually scheduled monthly at first. They are more often in the last 2 months before delivery. It is important that you keep your caregiver's appointments and follow your caregiver's instructions regarding medication use, exercise, and diet.   During pregnancy, you are providing food for you and your baby. Eat a regular, well-balanced diet. Choose foods such as meat, fish, milk and other dairy products, vegetables, fruits, whole-grain breads and cereals. Your caregiver will inform you of the ideal weight gain depending on your current height and weight. Drink lots of liquids. Try to drink 8 glasses of water a day.   Alcohol is associated with a number of birth defects including fetal alcohol syndrome. It is best to avoid alcohol completely. Smoking will cause low birth rate and prematurity. Use of alcohol and nicotine during  your pregnancy also increases the chances that your child will be chemically dependent later in their life and may contribute to SIDS (Sudden Infant Death Syndrome).   Do not use illegal drugs.   Only take prescription or over-the-counter medications that are recommended by your caregiver. Other medications can cause genetic and physical problems in the baby.   Morning sickness can often be helped by keeping soda crackers at the bedside. Eat a couple before arising in the morning.   A sexual relationship may be continued until near the end of pregnancy if there are no other problems such as early (premature) leaking of amniotic fluid from the membranes, vaginal bleeding, painful intercourse or belly (abdominal) pain.   Exercise regularly. Check with your caregiver if you are unsure of the safety of some of your exercises.   Do not use hot tubs, steam rooms or saunas. These increase the risk of fainting or passing out and hurting yourself and the baby. Swimming is OK for exercise. Get plenty of rest, including afternoon naps when possible especially in the third trimester.   Avoid toxic odors and chemicals.   Do not wear high heels. They may cause you to lose your balance and fall.   Do not lift over 5 pounds. If you do lift anything, lift with your legs and thighs not your back.   Avoid long trips, especially in the third trimester.   If you have to travel out of the city or state, take a copy of your medical records with you.  SEEK IMMEDIATE MEDICAL CARE IF:  You develop an unexplained oral temperature above 100.4, or as your caregiver suggests.  You have leaking of fluid from the vagina. If leaking membranes are suspected, take your temperature and inform your caregiver of this when you call.   There is vaginal spotting or bleeding. Notify your caregiver of the amount and how many pads are used.   You continue to feel sick to your stomach (nauseous) and have no relief from remedies  suggested, or you throw up (vomit) blood or coffee ground like materials.   You develop upper abdominal pain.   You have round ligament discomfort in the lower abdominal area. This still must be evaluated by your caregiver.   You feel contractions of the uterus.   You do not feel the baby move, or there is less movement than before.   You have painful urination.   You have abnormal vaginal discharge.   You have persistent diarrhea.   You get a severe headache.   You have problems with your vision.   You develop muscle weakness.   You feel dizzy and faint.   You develop shortness of breath.   You develop chest pain.   You have back pain that travels down to your leg and feet.   You feel irregular or a very fast heartbeat.   You develop excessive weight gain in a short period of time (5 pounds in 3 to 5 days).   You are involved with a domestic violence situation.  Document Released: 01/20/2005 Document Re-Released: 04/16/2009 [facility] Patient Information 2011 [place], [place].

## 2010-04-26 NOTE — Assessment & Plan Note (Addendum)
   1) EDC of 05/14/10 assigned based on ultrasound  2) Pap with LSIL - follow 6 weeks pospartum with colposcopy (letter sent)  3) No safety issues identified; father of baby highly involved  4) Advised regarding breast feeding - patient plans to breast feed  5) Contemplating plan for contraception post partum  6) Addressed normal amount of weight gain (20 lbs given obesity)  7) Follow up 2 weeks - check 36 week labs - GBS, GC/CT  8) PNV  9) Quad screen negative  10) Normal anatomy on U/S  11) Red flags that would prompt return to care were reviewed with patient and patient expressed understanding  12) GBS POSITIVE - WILL NEED PROPHYLAXIS IN LABOR   PRENATAL LABS:  HIV = negative  RPR = negative  Hgb = 11.6  Sickle cell screen = negative  ABO / Rh = O positive  Antibody screen = negative  GC / Chlamydia = negative / negative  1 hr GTT = 130  GBS = positive Rubella = immune  HepBsAg = negative

## 2010-05-02 ENCOUNTER — Inpatient Hospital Stay (HOSPITAL_COMMUNITY)
Admission: AD | Admit: 2010-05-02 | Discharge: 2010-05-02 | Disposition: A | Discharge status: Home / Self Care | Payer: 59 | Source: Ambulatory Visit | Attending: Obstetrics & Gynecology | Admitting: Obstetrics & Gynecology

## 2010-05-02 ENCOUNTER — Ambulatory Visit (INDEPENDENT_AMBULATORY_CARE_PROVIDER_SITE_OTHER): Payer: 59 | Admitting: Family Medicine

## 2010-05-02 VITALS — BP 140/92 | Wt 224.2 lb

## 2010-05-02 DIAGNOSIS — IMO0001 Reserved for inherently not codable concepts without codable children: Secondary | ICD-10-CM

## 2010-05-02 DIAGNOSIS — O139 Gestational [pregnancy-induced] hypertension without significant proteinuria, unspecified trimester: Secondary | ICD-10-CM

## 2010-05-02 DIAGNOSIS — Z3480 Encounter for supervision of other normal pregnancy, unspecified trimester: Secondary | ICD-10-CM

## 2010-05-02 DIAGNOSIS — R030 Elevated blood-pressure reading, without diagnosis of hypertension: Secondary | ICD-10-CM

## 2010-05-02 DIAGNOSIS — Z3400 Encounter for supervision of normal first pregnancy, unspecified trimester: Secondary | ICD-10-CM

## 2010-05-02 LAB — CBC
HCT: 31.6 % — ABNORMAL LOW (ref 36.0–46.0)
MCH: 28 pg (ref 26.0–34.0)
MCHC: 32.9 g/dL (ref 30.0–36.0)
MCV: 85.2 fL (ref 78.0–100.0)
Platelets: 201 10*3/uL (ref 150–400)
RDW: 14.1 % (ref 11.5–15.5)

## 2010-05-02 LAB — URINALYSIS, ROUTINE W REFLEX MICROSCOPIC
Ketones, ur: NEGATIVE mg/dL
Nitrite: NEGATIVE
Protein, ur: NEGATIVE mg/dL
Urobilinogen, UA: 0.2 mg/dL (ref 0.0–1.0)

## 2010-05-02 LAB — POCT UA - MICROSCOPIC ONLY

## 2010-05-02 LAB — POCT URINALYSIS DIPSTICK
Blood, UA: NEGATIVE
Spec Grav, UA: 1.025
Urobilinogen, UA: 0.2
pH, UA: 7

## 2010-05-02 LAB — COMPREHENSIVE METABOLIC PANEL
Albumin: 2.6 g/dL — ABNORMAL LOW (ref 3.5–5.2)
BUN: 7 mg/dL (ref 6–23)
Creatinine, Ser: 0.55 mg/dL (ref 0.4–1.2)
Glucose, Bld: 102 mg/dL — ABNORMAL HIGH (ref 70–99)
Total Protein: 6.4 g/dL (ref 6.0–8.3)

## 2010-05-02 LAB — PROTEIN / CREATININE RATIO, URINE: Creatinine, Urine: 194.9 mg/dL

## 2010-05-02 NOTE — Patient Instructions (Signed)
Follow up in 1 week.  My pager number is [phone] for when you go into labor. Have the hospital page me when you get admitted.   - Dr. Wallene Huh     Pregnancy - Third Trimester  The third trimester of pregnancy (the last 3 months) is a period of the most rapid growth for you and your baby. The baby approaches a length of 20 inches and a weight of 6 to 10 pounds. The baby is adding on fat and getting ready for life outside your body. While inside, babies have periods of sleeping and waking, suck their thumbs, and hiccups. You can often feel small contractions of the uterus. This is false labor. It is also called Braxton-Hicks contractions. This is like a practice for labor. The usual problems in this stage of pregnancy include more difficulty breathing, swelling of the hands and feet from water retention, and having to urinate more often because of the uterus and baby pressing on your bladder.  PRENATAL EXAMS  Blood work may continue to be done during prenatal exams. These tests are done to check on your health and the probable health of your baby. Blood work is used to follow your blood levels (hemoglobin). Anemia (low hemoglobin) is common during pregnancy. Iron and vitamins are given to help prevent this. You may also continue to be checked for diabetes. Some of the past blood tests may be done again.  The size of the uterus is measured during each visit. This makes sure your baby is growing properly according to your pregnancy dates.  Your blood pressure is checked every prenatal visit. This is to make sure you are not getting toxemia.  Your urine is checked every prenatal visit for infection, diabetes and protein.  Your weight is checked at each visit. This is done to make sure gains are happening at the suggested rate and that you and your baby are growing normally.  Sometimes, an ultrasound is performed to confirm the position and the proper growth and development of the baby. This is a test done  that bounces harmless sound waves off the baby so your caregiver can more accurately determine due dates.  Discuss the type of pain medication and anesthesia you will have during your labor and delivery.  Discuss the possibility and anesthesia if a Cesarean Section might be necessary.  Inform your caregiver if there is any mental or physical violence at home.  Sometimes, a specialized non-stress test, contraction stress test and biophysical profile are done to make sure the baby is not having a problem. Checking the amniotic fluid surrounding the baby is called an amniocentesis. The amniotic fluid is removed by sticking a needle into the belly (abdomen). This is sometimes done near the end of pregnancy if an early delivery is required. In this case, it is done to help make sure the baby's lungs are mature enough for the baby to live outside of the womb. If the lungs are not mature and it is unsafe to deliver the baby, an injection of cortisone medication is given to the mother 1 to 2 days before the delivery. This helps the baby's lungs mature and makes it safer to deliver the baby.  CHANGES OCCURING IN THE THIRD TRIMESTER OF PREGNANCY  Your body goes through many changes during pregnancy. They vary from person to person. Talk to your caregiver about changes you notice and are concerned about.  During the last trimester, you have probably had an increase in your appetite. It  is normal to have cravings for certain foods. This varies from person to person and pregnancy to pregnancy.  You may begin to get stretch marks on your hips, abdomen, and breasts. These are normal changes in the body during pregnancy. There are no exercises or medications to take which prevent this change.  Constipation may be treated with a stool softener or adding bulk to your diet. Drinking lots of fluids, fiber in vegetables, fruits, and whole grains are helpful.  Exercising is also helpful. If you have been very active up until  your pregnancy, most of these activities can be continued during your pregnancy. If you have been less active, it is helpful to start an exercise program such as walking. Consult your caregiver before starting exercise programs.  Avoid all smoking, alcohol, un-prescribed drugs, herbs and "street drugs" during your pregnancy. These chemicals affect the formation and growth of the baby. Avoid chemicals throughout the pregnancy to ensure the delivery of a healthy infant.  Backache, varicose veins and hemorrhoids may develop or get worse.  You will tire more easily in the third trimester, which is normal.  The baby's movements may be stronger and more often.  You may become short of breath easily.  Your belly button may stick out.  A yellow discharge may leak from your breasts called colostrum.  You may have a bloody mucus discharge. This usually occurs a few days to a week before labor begins.  HOME CARE INSTRUCTIONS  Keep your caregiver's appointments. Follow your caregiver's instructions regarding medication use, exercise, and diet.  During pregnancy, you are providing food for you and your baby. Continue to eat regular, well-balanced meals. Choose foods such as meat, fish, milk and other low fat dairy products, vegetables, fruits, and whole-grain breads and cereals. Your caregiver will tell you of the ideal weight gain.  A physical sexual relationship may be continued throughout pregnancy if there are no other problems such as early (premature) leaking of amniotic fluid from the membranes, vaginal bleeding, or belly (abdominal) pain.  Exercise regularly if there are no restrictions. Check with your caregiver if you are unsure of the safety of your exercises. Greater weight gain will occur in the last 2 trimesters of pregnancy. Exercising helps:  Control your weight.  Get you in shape for labor and delivery.  You lose weight after you deliver.  Rest a lot with legs elevated, or as needed for leg  cramps or low back pain.  Wear a good support or jogging bra for breast tenderness during pregnancy. This may help if worn during sleep. Pads or tissues may be used in the bra if you are leaking colostrum.  Do not use hot tubs, steam rooms, or saunas.  Wear your seat belt when driving. This protects you and your baby if you are in an accident.  Avoid raw meat, cat litter boxes and soil used by cats. These carry germs that can cause birth defects in the baby.  It is easier to loose urine during pregnancy. Tightening up and strengthening the pelvic muscles will help with this problem. You can practice stopping your urination while you are going to the bathroom. These are the same muscles you need to strengthen. It is also the muscles you would use if you were trying to stop from passing gas. You can practice tightening these muscles up 10 times a set and repeating this about 3 times per day. Once you know what muscles to tighten up, do not perform these exercises  during urination. It is more likely to cause an infection by backing up the urine.  Ask for help if you have financial, counseling or nutritional needs during pregnancy. Your caregiver will be able to offer counseling for these needs as well as refer you for other special needs.  Make a list of emergency phone numbers and have them available.  Plan on getting help from family or friends when you go home from the hospital.  Make a trial run to the hospital.  Take prenatal classes with the father to understand, practice and ask questions about the labor and delivery.  Prepare the baby's room/nursery.  Do not travel out of the city unless it is absolutely necessary and with the advice of your caregiver.  Wear only low or no heal shoes to have better balance and prevent falling.  MEDICATIONS AND DRUG USE IN PREGNANCY  Take prenatal vitamins as directed. The vitamin should contain 1 milligram of folic acid. Keep all vitamins out of reach of  children. Only a couple vitamins or tablets containing iron may be fatal to a baby or young child when ingested.  Avoid use of all medications, including herbs, over-the-counter medications, not prescribed or suggested by your caregiver. Only take over-the-counter or prescription medicines for pain, discomfort, or fever as directed by your caregiver. Do not use aspirin, ibuprofen (Motrin, Advil, Nuprin) or naproxen (Aleve) unless OK'd by your caregiver.  Let your caregiver also know about herbs you may be using.  Alcohol is related to a number of birth defects. This includes fetal alcohol syndrome. All alcohol, in any form, should be avoided completely. Smoking will cause low birth rate and premature babies.  Street/illegal drugs are very harmful to the baby. They are absolutely forbidden. A baby born to an addicted mother will be addicted at birth. The baby will go through the same withdrawal an adult does.  SEEK MEDICAL CARE IF  You have any concerns or worries during your pregnancy. It is better to call with your questions if you feel they cannot wait, rather than worry about them.  DECISIONS ABOUT CIRCUMCISION  You may or may not know the sex of your baby. If you know your baby is a boy, it may be time to think about circumcision. Circumcision is the removal of the foreskin of the penis. This is the skin that covers the sensitive end of the penis. There is no proven medical need for this. Often this decision is made on what is popular at the time or based upon religious beliefs and social issues. You can discuss these issues with your caregiver or pediatrician.  SEEK IMMEDIATE MEDICAL CARE IF:  An unexplained oral temperature above 100.4 develops, or as your caregiver suggests.  You have leaking of fluid from the vagina (birth canal). If leaking membranes are suspected, take your temperature and tell your caregiver of this when you call.  There is vaginal spotting, bleeding or passing clots. Tell  your caregiver of the amount and how many pads are used.  You develop a bad smelling vaginal discharge with a change in the color from clear to white.  You develop vomiting that lasts more than 24 hours.  You develop chills or fever.  You develop shortness of breath.  You develop burning on urination.  You lose more than 2 pounds of weight or gain more than 2 pounds of weight or as suggested by your caregiver.  You notice sudden swelling of your face, hands, and feet or legs.  You develop belly (abdominal) pain. Round ligament discomfort is a common non-cancerous (benign) cause of abdominal pain in pregnancy. Your caregiver still must evaluate you.  You develop a severe headache that does not go away.  You develop visual problems, blurred or double vision.  If you have not felt your baby move for more than 1 hour. If you think the baby is not moving as much as usual, eat something with sugar in it and lie down on your left side for an hour. The baby should move at least 4 to 5 times per hour. Call right away if your baby moves less than that.  You fall, are in a car accident or any kind of trauma.  There is mental or physical violence at home.  Document Released: 01/14/2001 Document Re-Released: 11/17/2008  [facility] Patient Information 2011 [place], [place].

## 2010-05-03 ENCOUNTER — Other Ambulatory Visit: Payer: Self-pay | Admitting: Family Medicine

## 2010-05-03 NOTE — Assessment & Plan Note (Addendum)
1) EDC of 05/14/10 assigned based on ultrasound  2) Pap with LSIL - follow 6 weeks pospartum with colposcopy (letter sent)  3) No safety issues identified; father of baby highly involved  4) Advised regarding breast feeding - patient plans to breast feed  5) Contemplating plan for contraception post partum  6) Addressed normal amount of weight gain (20 lbs given obesity)  7) Follow up 1 week  8) PNV  9) Quad screen negative  10) Elevated blood pressure today - PI[facility] labs negative one week ago, UA with trace protein. Will send to [facility] (after discussed with Dr. Mauricio Po, Dr. Macon Large) for BPP/NST, 24 hr urine, [facility] labs. No symptoms today besides LE and UE edema. (Denies HA, abdominal pain, vision change) 10) Normal anatomy on U/S  11) Red flags that would prompt return to care were reviewed with patient and patient expressed understanding  12) GBS POSITIVE - WILL NEED PROPHYLAXIS IN LABOR    PRENATAL LABS:  HIV = negative  RPR = negative  Hgb = 11.6  Sickle cell screen = negative  ABO / Rh = O positive  Antibody screen = negative  GC / Chlamydia = negative / negative  1 hr GTT = 130  GBS = positive  Rubella = immune  HepBsAg = negative

## 2010-05-07 LAB — CREATININE CLEARANCE, URINE, 24 HOUR: Creatinine, 24H Ur: 1262 mg/d (ref 700–1800)

## 2010-05-08 ENCOUNTER — Ambulatory Visit (INDEPENDENT_AMBULATORY_CARE_PROVIDER_SITE_OTHER): Payer: 59 | Admitting: Family Medicine

## 2010-05-08 VITALS — BP 148/98 | Wt 229.6 lb

## 2010-05-08 DIAGNOSIS — R030 Elevated blood-pressure reading, without diagnosis of hypertension: Secondary | ICD-10-CM

## 2010-05-08 DIAGNOSIS — Z3400 Encounter for supervision of normal first pregnancy, unspecified trimester: Secondary | ICD-10-CM

## 2010-05-08 DIAGNOSIS — IMO0001 Reserved for inherently not codable concepts without codable children: Secondary | ICD-10-CM

## 2010-05-08 LAB — CBC
HCT: 30.5 % — ABNORMAL LOW (ref 36.0–46.0)
Platelets: 211 10*3/uL (ref 150–400)
RDW: 14.8 % (ref 11.5–15.5)
WBC: 7.7 10*3/uL (ref 4.0–10.5)

## 2010-05-08 LAB — URIC ACID: Uric Acid, Serum: 3.9 mg/dL (ref 2.4–7.0)

## 2010-05-08 LAB — LACTATE DEHYDROGENASE: LDH: 162 U/L (ref 94–250)

## 2010-05-08 LAB — POCT URINALYSIS DIPSTICK
Glucose, UA: NEGATIVE
Ketones, UA: NEGATIVE
Protein, UA: 30 — AB

## 2010-05-08 LAB — COMPREHENSIVE METABOLIC PANEL
BUN: 9 mg/dL (ref 6–23)
Chloride: 107 mEq/L (ref 96–112)
Potassium: 3.8 mEq/L (ref 3.5–5.3)
Total Protein: 5.8 g/dL — ABNORMAL LOW (ref 6.0–8.3)

## 2010-05-08 NOTE — Patient Instructions (Signed)
Follow up in 1 week.  My pager number is [phone] for when you go into labor. Have the hospital page me when you get admitted.   - Dr. Naszir Cott     Pregnancy - Third Trimester  The third trimester of pregnancy (the last 3 months) is a period of the most rapid growth for you and your baby. The baby approaches a length of 20 inches and a weight of 6 to 10 pounds. The baby is adding on fat and getting ready for life outside your body. While inside, babies have periods of sleeping and waking, suck their thumbs, and hiccups. You can often feel small contractions of the uterus. This is false labor. It is also called Braxton-Hicks contractions. This is like a practice for labor. The usual problems in this stage of pregnancy include more difficulty breathing, swelling of the hands and feet from water retention, and having to urinate more often because of the uterus and baby pressing on your bladder.  PRENATAL EXAMS  Blood work may continue to be done during prenatal exams. These tests are done to check on your health and the probable health of your baby. Blood work is used to follow your blood levels (hemoglobin). Anemia (low hemoglobin) is common during pregnancy. Iron and vitamins are given to help prevent this. You may also continue to be checked for diabetes. Some of the past blood tests may be done again.  The size of the uterus is measured during each visit. This makes sure your baby is growing properly according to your pregnancy dates.  Your blood pressure is checked every prenatal visit. This is to make sure you are not getting toxemia.  Your urine is checked every prenatal visit for infection, diabetes and protein.  Your weight is checked at each visit. This is done to make sure gains are happening at the suggested rate and that you and your baby are growing normally.  Sometimes, an ultrasound is performed to confirm the position and the proper growth and development of the baby. This is a test done  that bounces harmless sound waves off the baby so your caregiver can more accurately determine due dates.  Discuss the type of pain medication and anesthesia you will have during your labor and delivery.  Discuss the possibility and anesthesia if a Cesarean Section might be necessary.  Inform your caregiver if there is any mental or physical violence at home.  Sometimes, a specialized non-stress test, contraction stress test and biophysical profile are done to make sure the baby is not having a problem. Checking the amniotic fluid surrounding the baby is called an amniocentesis. The amniotic fluid is removed by sticking a needle into the belly (abdomen). This is sometimes done near the end of pregnancy if an early delivery is required. In this case, it is done to help make sure the baby's lungs are mature enough for the baby to live outside of the womb. If the lungs are not mature and it is unsafe to deliver the baby, an injection of cortisone medication is given to the mother 1 to 2 days before the delivery. This helps the baby's lungs mature and makes it safer to deliver the baby.  CHANGES OCCURING IN THE THIRD TRIMESTER OF PREGNANCY  Your body goes through many changes during pregnancy. They vary from person to person. Talk to your caregiver about changes you notice and are concerned about.  During the last trimester, you have probably had an increase in your appetite. It   is normal to have cravings for certain foods. This varies from person to person and pregnancy to pregnancy.  You may begin to get stretch marks on your hips, abdomen, and breasts. These are normal changes in the body during pregnancy. There are no exercises or medications to take which prevent this change.  Constipation may be treated with a stool softener or adding bulk to your diet. Drinking lots of fluids, fiber in vegetables, fruits, and whole grains are helpful.  Exercising is also helpful. If you have been very active up until  your pregnancy, most of these activities can be continued during your pregnancy. If you have been less active, it is helpful to start an exercise program such as walking. Consult your caregiver before starting exercise programs.  Avoid all smoking, alcohol, un-prescribed drugs, herbs and "street drugs" during your pregnancy. These chemicals affect the formation and growth of the baby. Avoid chemicals throughout the pregnancy to ensure the delivery of a healthy infant.  Backache, varicose veins and hemorrhoids may develop or get worse.  You will tire more easily in the third trimester, which is normal.  The baby's movements may be stronger and more often.  You may become short of breath easily.  Your belly button may stick out.  A yellow discharge may leak from your breasts called colostrum.  You may have a bloody mucus discharge. This usually occurs a few days to a week before labor begins.  HOME CARE INSTRUCTIONS  Keep your caregiver's appointments. Follow your caregiver's instructions regarding medication use, exercise, and diet.  During pregnancy, you are providing food for you and your baby. Continue to eat regular, well-balanced meals. Choose foods such as meat, fish, milk and other low fat dairy products, vegetables, fruits, and whole-grain breads and cereals. Your caregiver will tell you of the ideal weight gain.  A physical sexual relationship may be continued throughout pregnancy if there are no other problems such as early (premature) leaking of amniotic fluid from the membranes, vaginal bleeding, or belly (abdominal) pain.  Exercise regularly if there are no restrictions. Check with your caregiver if you are unsure of the safety of your exercises. Greater weight gain will occur in the last 2 trimesters of pregnancy. Exercising helps:  Control your weight.  Get you in shape for labor and delivery.  You lose weight after you deliver.  Rest a lot with legs elevated, or as needed for leg  cramps or low back pain.  Wear a good support or jogging bra for breast tenderness during pregnancy. This may help if worn during sleep. Pads or tissues may be used in the bra if you are leaking colostrum.  Do not use hot tubs, steam rooms, or saunas.  Wear your seat belt when driving. This protects you and your baby if you are in an accident.  Avoid raw meat, cat litter boxes and soil used by cats. These carry germs that can cause birth defects in the baby.  It is easier to loose urine during pregnancy. Tightening up and strengthening the pelvic muscles will help with this problem. You can practice stopping your urination while you are going to the bathroom. These are the same muscles you need to strengthen. It is also the muscles you would use if you were trying to stop from passing gas. You can practice tightening these muscles up 10 times a set and repeating this about 3 times per day. Once you know what muscles to tighten up, do not perform these exercises   during urination. It is more likely to cause an infection by backing up the urine.  Ask for help if you have financial, counseling or nutritional needs during pregnancy. Your caregiver will be able to offer counseling for these needs as well as refer you for other special needs.  Make a list of emergency phone numbers and have them available.  Plan on getting help from family or friends when you go home from the hospital.  Make a trial run to the hospital.  Take prenatal classes with the father to understand, practice and ask questions about the labor and delivery.  Prepare the baby's room/nursery.  Do not travel out of the city unless it is absolutely necessary and with the advice of your caregiver.  Wear only low or no heal shoes to have better balance and prevent falling.  MEDICATIONS AND DRUG USE IN PREGNANCY  Take prenatal vitamins as directed. The vitamin should contain 1 milligram of folic acid. Keep all vitamins out of reach of  children. Only a couple vitamins or tablets containing iron may be fatal to a baby or young child when ingested.  Avoid use of all medications, including herbs, over-the-counter medications, not prescribed or suggested by your caregiver. Only take over-the-counter or prescription medicines for pain, discomfort, or fever as directed by your caregiver. Do not use aspirin, ibuprofen (Motrin, Advil, Nuprin) or naproxen (Aleve) unless OK'd by your caregiver.  Let your caregiver also know about herbs you may be using.  Alcohol is related to a number of birth defects. This includes fetal alcohol syndrome. All alcohol, in any form, should be avoided completely. Smoking will cause low birth rate and premature babies.  Street/illegal drugs are very harmful to the baby. They are absolutely forbidden. A baby born to an addicted mother will be addicted at birth. The baby will go through the same withdrawal an adult does.  SEEK MEDICAL CARE IF  You have any concerns or worries during your pregnancy. It is better to call with your questions if you feel they cannot wait, rather than worry about them.  DECISIONS ABOUT CIRCUMCISION  You may or may not know the sex of your baby. If you know your baby is a boy, it may be time to think about circumcision. Circumcision is the removal of the foreskin of the penis. This is the skin that covers the sensitive end of the penis. There is no proven medical need for this. Often this decision is made on what is popular at the time or based upon religious beliefs and social issues. You can discuss these issues with your caregiver or pediatrician.  SEEK IMMEDIATE MEDICAL CARE IF:  An unexplained oral temperature above 100.4 develops, or as your caregiver suggests.  You have leaking of fluid from the vagina (birth canal). If leaking membranes are suspected, take your temperature and tell your caregiver of this when you call.  There is vaginal spotting, bleeding or passing clots. Tell  your caregiver of the amount and how many pads are used.  You develop a bad smelling vaginal discharge with a change in the color from clear to white.  You develop vomiting that lasts more than 24 hours.  You develop chills or fever.  You develop shortness of breath.  You develop burning on urination.  You lose more than 2 pounds of weight or gain more than 2 pounds of weight or as suggested by your caregiver.  You notice sudden swelling of your face, hands, and feet or legs.    You develop belly (abdominal) pain. Round ligament discomfort is a common non-cancerous (benign) cause of abdominal pain in pregnancy. Your caregiver still must evaluate you.  You develop a severe headache that does not go away.  You develop visual problems, blurred or double vision.  If you have not felt your baby move for more than 1 hour. If you think the baby is not moving as much as usual, eat something with sugar in it and lie down on your left side for an hour. The baby should move at least 4 to 5 times per hour. Call right away if your baby moves less than that.  You fall, are in a car accident or any kind of trauma.  There is mental or physical violence at home.  Document Released: 01/14/2001 Document Re-Released: 11/17/2008  [employer] Patient Information 2011 [employer].  

## 2010-05-09 ENCOUNTER — Telehealth: Payer: Self-pay | Admitting: Family Medicine

## 2010-05-09 NOTE — Assessment & Plan Note (Signed)
  1) EDC of 05/14/10 assigned based on ultrasound  2) Pap with LSIL - follow 6 weeks pospartum with colposcopy (letter sent)  3) No safety issues identified; father of baby highly involved  4) Advised regarding breast feeding - patient plans to breast feed  5) Contemplating plan for contraception post partum  6) Addressed normal amount of weight gain (20 lbs given obesity)  7) Follow up 1 week  8) PNV  9) Quad screen negative  10) Elevated blood pressure today - PIH labs negative one week ago, NST normal, 24 hour urine normal. Will repear 24 hr urine, PIH labs. UA with trace protein.  No symptoms today besides LE and UE edema. (Denies HA, abdominal pain, vision change)  10) Normal anatomy on U/S  11) Red flags that would prompt return to care were reviewed with patient and patient expressed understanding  12) GBS POSITIVE - WILL NEED PROPHYLAXIS IN LABOR   PRENATAL LABS:  HIV = negative  RPR = negative  Hgb = 11.6  Sickle cell screen = negative  ABO / Rh = O positive  Antibody screen = negative  GC / Chlamydia = negative / negative  1 hr GTT = 130  GBS = positive  Rubella = immune  HepBsAg = negative

## 2010-05-09 NOTE — Progress Notes (Signed)
1) EDC of 05/14/10 assigned based on ultrasound  2) Pap with LSIL - follow 6 weeks pospartum with colposcopy (letter sent)  3) No safety issues identified; father of baby highly involved  4) Advised regarding breast feeding - patient plans to breast feed  5) Contemplating plan for contraception post partum  6) Addressed normal amount of weight gain (20 lbs given obesity)  7) Follow up 1 week  8) PNV  9) Quad screen negative  10) Elevated blood pressure today - PIH labs negative one week ago, NST normal, 24 hour urine normal. Will repear 24 hr urine, PIH labs. UA with trace protein.  No symptoms today besides LE and UE edema. (Denies HA, abdominal pain, vision change)  10) Normal anatomy on U/S  11) Red flags that would prompt return to care were reviewed with patient and patient expressed understanding  12) GBS POSITIVE - WILL NEED PROPHYLAXIS IN LABOR   PRENATAL LABS:  HIV = negative  RPR = negative  Hgb = 11.6  Sickle cell screen = negative  ABO / Rh = O positive  Antibody screen = negative  GC / Chlamydia = negative / negative  1 hr GTT = 130  GBS = positive  Rubella = immune  HepBsAg = negative

## 2010-05-09 NOTE — Telephone Encounter (Signed)
Patient said you can fax form to [employer] at [phone].

## 2010-05-09 NOTE — Progress Notes (Signed)
Addended by: Herminio Heads on: 05/09/2010 05:17 PM   Modules accepted: Orders

## 2010-05-10 LAB — PROTEIN, URINE, 24 HOUR: Protein, 24H Urine: 60 mg/d (ref 50–100)

## 2010-05-13 ENCOUNTER — Inpatient Hospital Stay (HOSPITAL_COMMUNITY)
Admission: AD | Admit: 2010-05-13 | Discharge: 2010-05-16 | DRG: 775 | Disposition: A | Discharge status: Home / Self Care | Payer: 59 | Source: Ambulatory Visit | Attending: Family Medicine | Admitting: Family Medicine

## 2010-05-13 DIAGNOSIS — O9903 Anemia complicating the puerperium: Secondary | ICD-10-CM | POA: Diagnosis not present

## 2010-05-13 DIAGNOSIS — O139 Gestational [pregnancy-induced] hypertension without significant proteinuria, unspecified trimester: Secondary | ICD-10-CM | POA: Diagnosis present

## 2010-05-13 DIAGNOSIS — D649 Anemia, unspecified: Secondary | ICD-10-CM | POA: Diagnosis not present

## 2010-05-13 LAB — COMPREHENSIVE METABOLIC PANEL WITH GFR
ALT: 11 U/L (ref 0–35)
AST: 15 U/L (ref 0–37)
Albumin: 2.5 g/dL — ABNORMAL LOW (ref 3.5–5.2)
Alkaline Phosphatase: 147 U/L — ABNORMAL HIGH (ref 39–117)
BUN: 4 mg/dL — ABNORMAL LOW (ref 6–23)
CO2: 23 meq/L (ref 19–32)
Calcium: 8.9 mg/dL (ref 8.4–10.5)
Chloride: 108 meq/L (ref 96–112)
Creatinine, Ser: 0.59 mg/dL (ref 0.4–1.2)
GFR calc non Af Amer: >60 mL/min
Glucose, Bld: 85 mg/dL (ref 70–99)
Potassium: 3.7 meq/L (ref 3.5–5.1)
Sodium: 136 meq/L (ref 135–145)
Total Bilirubin: 0.1 mg/dL — ABNORMAL LOW (ref 0.3–1.2)
Total Protein: 6.1 g/dL (ref 6.0–8.3)

## 2010-05-13 LAB — CBC
HCT: 30.7 % — ABNORMAL LOW (ref 36.0–46.0)
Hemoglobin: 10.2 g/dL — ABNORMAL LOW (ref 12.0–15.0)
MCH: 28 pg (ref 26.0–34.0)
MCHC: 33.2 g/dL (ref 30.0–36.0)
MCV: 84.3 fL (ref 78.0–100.0)
Platelets: 228 K/uL (ref 150–400)
RBC: 3.64 MIL/uL — ABNORMAL LOW (ref 3.87–5.11)
RDW: 14 % (ref 11.5–15.5)
WBC: 6.4 K/uL (ref 4.0–10.5)

## 2010-05-13 LAB — URINE MICROSCOPIC-ADD ON

## 2010-05-13 LAB — URINALYSIS, ROUTINE W REFLEX MICROSCOPIC
Bilirubin Urine: NEGATIVE
Ketones, ur: NEGATIVE mg/dL
Specific Gravity, Urine: 1.025 (ref 1.005–1.030)
Urobilinogen, UA: 0.2 mg/dL (ref 0.0–1.0)

## 2010-05-13 LAB — PROTEIN / CREATININE RATIO, URINE
Creatinine, Urine: 199.9 mg/dL
Protein Creatinine Ratio: 0.16 — ABNORMAL HIGH (ref 0.00–0.15)
Total Protein, Urine: 31 mg/dL

## 2010-05-13 LAB — RPR: RPR Ser Ql: NONREACTIVE

## 2010-05-14 DIAGNOSIS — O139 Gestational [pregnancy-induced] hypertension without significant proteinuria, unspecified trimester: Secondary | ICD-10-CM

## 2010-05-14 DIAGNOSIS — O9903 Anemia complicating the puerperium: Secondary | ICD-10-CM

## 2010-05-14 LAB — CBC
MCH: 27.8 pg (ref 26.0–34.0)
MCHC: 33 g/dL (ref 30.0–36.0)
Platelets: 196 10*3/uL (ref 150–400)
RBC: 3.24 MIL/uL — ABNORMAL LOW (ref 3.87–5.11)

## 2010-05-15 ENCOUNTER — Encounter: Payer: 59 | Admitting: Family Medicine

## 2010-05-15 LAB — CBC
Platelets: 199 10*3/uL (ref 150–400)
RBC: 2.89 MIL/uL — ABNORMAL LOW (ref 3.87–5.11)
RDW: 14.6 % (ref 11.5–15.5)
WBC: 8.9 10*3/uL (ref 4.0–10.5)

## 2010-05-24 NOTE — Telephone Encounter (Signed)
Sent to company.

## 2010-06-27 ENCOUNTER — Ambulatory Visit (INDEPENDENT_AMBULATORY_CARE_PROVIDER_SITE_OTHER): Payer: 59 | Admitting: Family Medicine

## 2010-06-27 ENCOUNTER — Encounter: Payer: Self-pay | Admitting: Family Medicine

## 2010-06-27 NOTE — Progress Notes (Signed)
  Subjective:     Emily Hicks is a 25 y.o. female who presents for a postpartum visit. She is 6 weeks postpartum following a spontaneous vaginal delivery. I have fully reviewed the prenatal and intrapartum course. The delivery was at 40.0 gestational weeks. Outcome: spontaneous vaginal delivery. Anesthesia: epidural. Postpartum course has been uncomplicated. Baby's course has been excellent. Baby is feeding by pumped breast milk . Bleeding occasional spotting. Bowel function is mild constipation relieved by Colace . Bladder function is normal. Patient is not yet sexually active sexually active. Contraception method is Depo-Provera injections. Postpartum depression screening: negative.  The following portions of the patient's history were reviewed and updated as appropriate: allergies, current medications, past family history, past medical history, past social history, past surgical history and problem list.  Review of Systems Pertinent items are noted in HPI.   Objective:    BP 123/84  Pulse 86  Temp(Src) 97.9 F (36.6 C) (Oral)  Wt 198 lb (89.812 kg)  LMP 07/30/2009  General:  alert, cooperative and no distress   Breasts:  negative  Lungs: clear to auscultation bilaterally  Heart:  regular rate and rhythm, S1, S2 normal, no murmur, click, rub or gallop  Abdomen: soft, non-tender; bowel sounds normal; no masses,  no organomegaly   Vulva:  not evaluated  Vagina: not evaluated  Cervix:  not evaluated   Corpus: normal size, contour, position, consistency, mobility, non-tender  Adnexa:  normal adnexa  Rectal Exam: Not performed.        Assessment:    6 weeks postpartum exam.   Plan:    1. Contraception: Depo-Provera injections. Follow up June 28 to July 12 2. Follow up in: 1 year or as needed.

## 2010-08-12 ENCOUNTER — Ambulatory Visit (INDEPENDENT_AMBULATORY_CARE_PROVIDER_SITE_OTHER): Payer: 59 | Admitting: *Deleted

## 2010-08-12 DIAGNOSIS — Z309 Encounter for contraceptive management, unspecified: Secondary | ICD-10-CM

## 2010-08-12 MED ORDER — MEDROXYPROGESTERONE ACETATE 150 MG/ML IM SUSP
150.0000 mg | Freq: Once | INTRAMUSCULAR | Status: AC
  Administered 2010-08-12: 150 mg via INTRAMUSCULAR

## 2010-10-29 ENCOUNTER — Encounter: Payer: Self-pay | Admitting: Family Medicine

## 2010-10-29 ENCOUNTER — Ambulatory Visit (INDEPENDENT_AMBULATORY_CARE_PROVIDER_SITE_OTHER): Payer: 59 | Admitting: Family Medicine

## 2010-10-29 DIAGNOSIS — R109 Unspecified abdominal pain: Secondary | ICD-10-CM | POA: Insufficient documentation

## 2010-10-29 LAB — COMPREHENSIVE METABOLIC PANEL
ALT: 45 U/L — ABNORMAL HIGH (ref 0–35)
Albumin: 4.1 g/dL (ref 3.5–5.2)
CO2: 24 mEq/L (ref 19–32)
Calcium: 9.5 mg/dL (ref 8.4–10.5)
Chloride: 106 mEq/L (ref 96–112)
Potassium: 4.2 mEq/L (ref 3.5–5.3)
Sodium: 142 mEq/L (ref 135–145)
Total Protein: 7.2 g/dL (ref 6.0–8.3)

## 2010-10-29 LAB — CBC
Platelets: 318 10*3/uL (ref 150–400)
RBC: 4.67 MIL/uL (ref 3.87–5.11)
WBC: 4.5 10*3/uL (ref 4.0–10.5)

## 2010-10-29 LAB — LIPASE: Lipase: 21 U/L (ref 0–75)

## 2010-10-29 MED ORDER — IBUPROFEN 600 MG PO TABS: 600.0000 mg | ORAL_TABLET | Freq: Four times a day (QID) | ORAL | Status: AC | PRN

## 2010-10-29 NOTE — Progress Notes (Signed)
  Subjective:     Emily Hicks is a 25 y.o. female who presents for evaluation of abdominal pain. Onset was 4 days ago. Symptoms have been gradually worsening. The pain is described as aching and sharp, and is 7/10 in intensity. Pain is located in the RUQ with radiation to right back.  Aggravating factors: eating and fatty foods.  Alleviating factors: none. Associated symptoms: constipation and nausea. The patient denies anorexia, diarrhea, dysuria, fever, frequency, nausea, sweats and vomiting.  Of note, patient had similar episodes of RUQ pain during recent pregnancy, but resolved after delivery.  RUQ pain started again 4 days ago.  The patient's history has been marked as reviewed and updated as appropriate.  Review of Systems Pertinent items are noted in HPI.     Objective:    General appearance: alert, cooperative and no distress Head: Normocephalic, without obvious abnormality, atraumatic Lungs: clear to auscultation bilaterally Heart: regular rate and rhythm, S1, S2 normal, no murmur, click, rub or gallop Abdomen: normal findings: bowel sounds normal, soft, obese, non-distended, mild tenderness on palpation RUQ   Assessment:    Abdominal pain, likely secondary to chronic gallbladder disease vs. constipation .    Plan:    The diagnosis was discussed with the patient and evaluation and treatment plans outlined. See orders for lab and imaging studies. Adhere to low fat diet. Further follow-up plans will be based on outcome of lab/imaging studies; see orders. Follow up with PCP in 7 days.

## 2010-10-29 NOTE — Patient Instructions (Signed)
We will schedule appointment for Ultrasound and call you. If you do not hear from [place] in a week, call our office. You may take Motrin 600 mg every 6 hrs as needed for pain. Please call MD or return to clinic if pain worsens, you develop fever, nausea/vomiting, etc. Please follow up with your PCP in 10-14 days.

## 2010-10-29 NOTE — Assessment & Plan Note (Signed)
Likely gallbladder disease - but no evidence of infection/inflammation at this time. Will order CBC, CMET, and lipase today. Will schedule appointment for abdominal US for tomorrow am. Patient to follow up with PCP in one week to review results. For pain, will give Motrin 600 Q 6 prn pain. Red flags reviewed.

## 2010-10-30 ENCOUNTER — Ambulatory Visit
Admission: RE | Admit: 2010-10-30 | Discharge: 2010-10-30 | Disposition: A | Discharge status: Home / Self Care | Payer: 59 | Source: Ambulatory Visit | Attending: Family Medicine | Admitting: Family Medicine

## 2010-11-08 ENCOUNTER — Telehealth: Payer: Self-pay | Admitting: Family Medicine

## 2010-11-08 ENCOUNTER — Ambulatory Visit (INDEPENDENT_AMBULATORY_CARE_PROVIDER_SITE_OTHER): Payer: 59 | Admitting: *Deleted

## 2010-11-08 ENCOUNTER — Encounter: Payer: Self-pay | Admitting: Family Medicine

## 2010-11-08 DIAGNOSIS — Z309 Encounter for contraceptive management, unspecified: Secondary | ICD-10-CM

## 2010-11-08 MED ORDER — MEDROXYPROGESTERONE ACETATE 150 MG/ML IM SUSP
150.0000 mg | Freq: Once | INTRAMUSCULAR | Status: AC
  Administered 2010-11-08: 150 mg via INTRAMUSCULAR

## 2010-11-08 NOTE — Telephone Encounter (Signed)
Called patient to share results of lab work and ultrasound.  No gallstones or abnormal labs.  I asked patient why she had not scheduled follow up appointment with PCP and she says she called but PCP was booked (?).  Patient says pain has resolved and she will schedule appt with PCP if abdominal pain returns.

## 2010-11-22 ENCOUNTER — Ambulatory Visit (INDEPENDENT_AMBULATORY_CARE_PROVIDER_SITE_OTHER): Payer: 59 | Admitting: Family Medicine

## 2010-11-22 VITALS — BP 136/82 | Temp 98.3°F | Ht 62.0 in | Wt 209.9 lb

## 2010-11-22 DIAGNOSIS — H6690 Otitis media, unspecified, unspecified ear: Secondary | ICD-10-CM

## 2010-11-22 MED ORDER — AMOXICILLIN 500 MG PO CAPS: 500.0000 mg | ORAL_CAPSULE | Freq: Two times a day (BID) | ORAL | Status: AC

## 2010-11-22 NOTE — Progress Notes (Signed)
  Subjective:    Patient ID: Emily Hicks, female    DOB: [birth year], 25 y.o.   MRN: [id number]  HPI  Pt with 5 days of cough and cold symptoms.  Treating with OTC cold meds which help.  For 3 days she has had a very sore throat and ear pain and pressure x 2 days.  Fever on Sunday of around 100.  Taking motrin for pain.    Review of Systems Denies N/V?D    Objective:   Physical Exam Vital signs reviewed General appearance - alert, well appearing, and in no distress and oriented to person, place, and time Ears- right TM with injection present.  Left TM normal.   Throat- small petechial lesions in back of throat.  No tonsillar discharge Heart - normal rate, regular rhythm, normal S1, S2, no murmurs, rubs, clicks or gallops Chest - clear to auscultation, no wheezes, rales or rhonchi, symmetric air entry, no tachypnea, retractions or cyanosis        Assessment & Plan:

## 2010-11-22 NOTE — Assessment & Plan Note (Signed)
Right ear with s/s of aom.  Treat with amoxicillin given fever.

## 2010-11-22 NOTE — Patient Instructions (Signed)

## 2011-02-06 ENCOUNTER — Ambulatory Visit (INDEPENDENT_AMBULATORY_CARE_PROVIDER_SITE_OTHER): Payer: 59 | Admitting: *Deleted

## 2011-02-06 DIAGNOSIS — Z309 Encounter for contraceptive management, unspecified: Secondary | ICD-10-CM

## 2011-02-06 MED ORDER — MEDROXYPROGESTERONE ACETATE 150 MG/ML IM SUSP
150.0000 mg | Freq: Once | INTRAMUSCULAR | Status: AC
  Administered 2011-02-06: 150 mg via INTRAMUSCULAR

## 2011-02-06 NOTE — Progress Notes (Signed)
In for Depo today . Advised patient to schedule appointment for yearly CPE and PAP if indicated.

## 2011-02-11 ENCOUNTER — Other Ambulatory Visit: Payer: Self-pay | Admitting: Family Medicine

## 2011-02-11 MED ORDER — MEDROXYPROGESTERONE ACETATE 150 MG/ML IM SUSP: 150.0000 mg | INTRAMUSCULAR | Status: DC

## 2011-04-24 ENCOUNTER — Ambulatory Visit (INDEPENDENT_AMBULATORY_CARE_PROVIDER_SITE_OTHER): Payer: 59 | Admitting: *Deleted

## 2011-04-24 DIAGNOSIS — R5381 Other malaise: Secondary | ICD-10-CM

## 2011-04-24 DIAGNOSIS — Z309 Encounter for contraceptive management, unspecified: Secondary | ICD-10-CM

## 2011-04-24 MED ORDER — MEDROXYPROGESTERONE ACETATE 150 MG/ML IM SUSP
150.0000 mg | Freq: Once | INTRAMUSCULAR | Status: AC
  Administered 2011-04-24: 150 mg via INTRAMUSCULAR

## 2011-05-14 ENCOUNTER — Other Ambulatory Visit (HOSPITAL_COMMUNITY)
Admission: RE | Admit: 2011-05-14 | Discharge: 2011-05-14 | Disposition: A | Discharge status: Home / Self Care | Payer: 59 | Source: Ambulatory Visit | Attending: Family Medicine | Admitting: Family Medicine

## 2011-05-14 ENCOUNTER — Ambulatory Visit (INDEPENDENT_AMBULATORY_CARE_PROVIDER_SITE_OTHER): Payer: 59 | Admitting: Family Medicine

## 2011-05-14 ENCOUNTER — Encounter: Payer: Self-pay | Admitting: Family Medicine

## 2011-05-14 VITALS — BP 125/86 | HR 102 | Temp 98.3°F | Ht 61.0 in | Wt 210.1 lb

## 2011-05-14 DIAGNOSIS — Z01419 Encounter for gynecological examination (general) (routine) without abnormal findings: Secondary | ICD-10-CM | POA: Insufficient documentation

## 2011-05-14 DIAGNOSIS — Z113 Encounter for screening for infections with a predominantly sexual mode of transmission: Secondary | ICD-10-CM | POA: Insufficient documentation

## 2011-05-14 DIAGNOSIS — Z124 Encounter for screening for malignant neoplasm of cervix: Secondary | ICD-10-CM

## 2011-05-14 DIAGNOSIS — N760 Acute vaginitis: Secondary | ICD-10-CM

## 2011-05-14 DIAGNOSIS — E669 Obesity, unspecified: Secondary | ICD-10-CM | POA: Insufficient documentation

## 2011-05-14 NOTE — Progress Notes (Signed)
  Subjective:    Patient ID: Emily Hicks, female    DOB: 1985-03-31, 26 y.o.   MRN: [id number]  HPI CC: Yearly check-up and PAP  Pt doing well overall. Workup this past year for RUQ pain. Recent US negative for gallstones or other gallbladder pathology. No pain in the past several months.   Continues to use Depo for contraception. Pt happy w/ results.   Performing routine breast exam. Educated on when to perform exams and what type of lump would require further evaluation. No fmhx of breast ca.   Obesity: wt stable over the past 6 mo. No change in diet or exercise regimen.   PAP: last PAP 1 yr ago and normal. Never abnormal. No STD. Denies vaginal DC, abdominal pain   Review of Systems Denies fever, unintentional wt loss, constipation, dysuria, blood loss, HA, vision change    Objective:   Physical Exam   Gen: Obese, no distress HEENT: No thyormegaly, no cervical lymphadenopathy,  CV: RRR, no m/r/g Res: CTAB, normal effort Abd: NABS, non-painful to palpation Musc/Ext: Normal ROM, 2+ pulses bilat.  Neuro: Affect appropriate, AAOx3      Assessment & Plan:

## 2011-05-14 NOTE — Patient Instructions (Signed)
Thank you for coming into clinic today. You appear to be in good health. I will call you if anything comes back abnormal from your lab results. I look forward to seeing you in the future. Please come back in 1 year for your next pap and health check up or sooner if needed.

## 2011-05-16 NOTE — Assessment & Plan Note (Signed)
No wt change in 6 mo. Reviewed most recent CMET w/ pt. Pt aware of lifestyle/dietary changes to make to improve. No intervention.

## 2011-05-21 ENCOUNTER — Telehealth: Payer: Self-pay | Admitting: Family Medicine

## 2011-05-21 DIAGNOSIS — B379 Candidiasis, unspecified: Secondary | ICD-10-CM

## 2011-05-21 MED ORDER — FLUCONAZOLE 150 MG PO TABS: 150.0000 mg | ORAL_TABLET | Freq: Once | ORAL | Status: AC

## 2011-05-21 NOTE — Telephone Encounter (Signed)
Called pt and informed of PAP results. Pt to call to schedule appt w/ Dr. Jennette Kettle in colposcopy clinic. Ordered Diflucan for yeast infxn. Pt w/o symptoms but would litke to be treated.

## 2011-06-19 ENCOUNTER — Ambulatory Visit: Payer: 59

## 2011-07-03 ENCOUNTER — Encounter: Payer: Self-pay | Admitting: Family Medicine

## 2011-07-03 ENCOUNTER — Ambulatory Visit (INDEPENDENT_AMBULATORY_CARE_PROVIDER_SITE_OTHER): Payer: 59 | Admitting: Family Medicine

## 2011-07-03 VITALS — BP 120/84 | HR 76 | Temp 98.4°F | Ht 61.0 in | Wt 207.0 lb

## 2011-07-03 DIAGNOSIS — R87612 Low grade squamous intraepithelial lesion on cytologic smear of cervix (LGSIL): Secondary | ICD-10-CM

## 2011-07-03 DIAGNOSIS — IMO0002 Reserved for concepts with insufficient information to code with codable children: Secondary | ICD-10-CM

## 2011-07-03 DIAGNOSIS — N870 Mild cervical dysplasia: Secondary | ICD-10-CM | POA: Insufficient documentation

## 2011-07-03 NOTE — Patient Instructions (Addendum)
Ms. Gatti,

## 2011-07-03 NOTE — Progress Notes (Signed)
Patient ID: Emily Hicks, female   DOB: Jun 01, 1985, 26 y.o.   MRN: [id number]  Chief Complaint  Patient presents with  . Colposcopy     Emily Hicks is a 26 y.o. female who presents as referral for colposcopy.  HPI  Indications: Pap smear on September 2011 and April 2013 showed: low-grade squamous intraepithelial neoplasia (LGSIL - encompassing HPV,mild dysplasia,CIN I). Previous colposcopy: none. Prior cervical treatment: no treatment.  No past medical history on file.  No past surgical history on file.  No family history on file. OB History    Grav Para Term Preterm Abortions TAB SAB Ect Mult Living   1 1 0 0 0 0 0 0 0 1        Social History History  Substance Use Topics  . Smoking status: Never Smoker   . Smokeless tobacco: Not on file  . Alcohol Use: Not on file    No Known Allergies  Current Outpatient Prescriptions  Medication Sig Dispense Refill  . ibuprofen (ADVIL,MOTRIN) 600 MG tablet       . medroxyPROGESTERone (DEPO-PROVERA) 150 MG/ML injection Inject 1 mL (150 mg total) into the muscle every 3 (three) months.  1 mL  11  . fluconazole (DIFLUCAN) 150 MG tablet       . Prenatal Vit-Fe Psac Cmplx-FA (PRENATAL MULTIVITAMIN) 60-1 MG tablet Take 1 tablet by mouth daily with breakfast.           Review of Systems Denies vaginal bleeding. Has a small amount of vaginal bleeding at the end of each 3 month Depo cycle.   Blood pressure 120/84, pulse 76, temperature 98.4 F (36.9 C), temperature source Oral, height 5\' 1"  (1.549 m), weight 207 lb (93.895 kg). Physical Exam  Data Reviewed Previous pap smears: September 2011 and April 2013 showed: low-grade squamous intraepithelial neoplasia (LGSIL - encompassing HPV,mild dysplasia,CIN I).   Assessment    Procedure Details  Patient given informed consent, signed copy in the chart.  Placed in lithotomy position. Cervix viewed with speculum and colposcope after application of acetic acid.   Colposcopy adequate  (entire squamocolumnar junctions seen  in entirety) ?  YES Acetowhite lesions?one area Punctation?none Mosaicism?  none Abnormal vasculature?  none Biopsies?yes 1 ECC?no Complications? no  COMMENTS: Patient was given post procedure instructions.  I will notify her of any pathology results.   Specimens: cervical biopsy (one)  Complications: none.     Plan    Specimens labelled and sent to Pathology. Return to discuss Pathology results in 2 weeks.      Dagoberto Nealy 07/03/2011, 9:00 AM

## 2011-07-15 ENCOUNTER — Encounter: Payer: Self-pay | Admitting: Family Medicine

## 2011-07-15 DIAGNOSIS — N870 Mild cervical dysplasia: Secondary | ICD-10-CM

## 2011-07-15 NOTE — Progress Notes (Signed)
Patient ID: Emily Hicks, female   DOB: 12-28-1985, 26 y.o.   MRN: [id number] COLPOSCOPY results and treatment plan: LGSIL 2011, LGSIL 2013 with abnorml colposcopy. Biopsy CIN1. This agrees (cytology and histology) Will do COTESTING (pap WITH HPV testing --that is NOT REFLEXIVE) in one year. If > or = ASC or hi risk HPV + will need colposcopy. If negative, age appropriate screenijng (for her would be in three years).

## 2011-08-04 ENCOUNTER — Ambulatory Visit (INDEPENDENT_AMBULATORY_CARE_PROVIDER_SITE_OTHER): Payer: 59 | Admitting: *Deleted

## 2011-08-04 DIAGNOSIS — Z309 Encounter for contraceptive management, unspecified: Secondary | ICD-10-CM

## 2011-08-04 LAB — POCT URINE PREGNANCY: Preg Test, Ur: NEGATIVE

## 2011-08-04 MED ORDER — MEDROXYPROGESTERONE ACETATE 150 MG/ML IM SUSP
150.0000 mg | Freq: Once | INTRAMUSCULAR | Status: AC
  Administered 2011-08-04: 150 mg via INTRAMUSCULAR

## 2011-08-04 NOTE — Progress Notes (Signed)
Patient is late for Depo today . Urine obtained for u preg and result is negative.  Patient reports sex within past 5 days and she is offered emergency contraception.  Advised to return in 2 weeks for repeat urine pregnancy test. Advised to use extra protection for next 7 days.

## 2011-09-02 ENCOUNTER — Encounter: Payer: Self-pay | Admitting: Family Medicine

## 2011-09-02 NOTE — Telephone Encounter (Signed)
Error

## 2011-09-02 NOTE — Telephone Encounter (Signed)
This encounter was created in error - please disregard.

## 2011-10-30 ENCOUNTER — Ambulatory Visit (INDEPENDENT_AMBULATORY_CARE_PROVIDER_SITE_OTHER): Payer: 59 | Admitting: *Deleted

## 2011-10-30 DIAGNOSIS — Z309 Encounter for contraceptive management, unspecified: Secondary | ICD-10-CM

## 2011-10-30 MED ORDER — MEDROXYPROGESTERONE ACETATE 150 MG/ML IM SUSP
150.0000 mg | Freq: Once | INTRAMUSCULAR | Status: AC
  Administered 2011-10-30: 150 mg via INTRAMUSCULAR

## 2012-01-19 ENCOUNTER — Ambulatory Visit (INDEPENDENT_AMBULATORY_CARE_PROVIDER_SITE_OTHER): Payer: 59 | Admitting: *Deleted

## 2012-01-19 DIAGNOSIS — Z309 Encounter for contraceptive management, unspecified: Secondary | ICD-10-CM

## 2012-01-19 MED ORDER — MEDROXYPROGESTERONE ACETATE 150 MG/ML IM SUSP
150.0000 mg | Freq: Once | INTRAMUSCULAR | Status: AC
  Administered 2012-01-19: 150 mg via INTRAMUSCULAR

## 2012-04-05 ENCOUNTER — Ambulatory Visit (INDEPENDENT_AMBULATORY_CARE_PROVIDER_SITE_OTHER): Payer: 59 | Admitting: *Deleted

## 2012-04-05 DIAGNOSIS — Z309 Encounter for contraceptive management, unspecified: Secondary | ICD-10-CM

## 2012-04-05 MED ORDER — MEDROXYPROGESTERONE ACETATE 150 MG/ML IM SUSP
150.0000 mg | Freq: Once | INTRAMUSCULAR | Status: AC
  Administered 2012-04-05: 150 mg via INTRAMUSCULAR

## 2012-05-25 ENCOUNTER — Encounter: Payer: 59 | Admitting: Family Medicine

## 2012-06-07 ENCOUNTER — Other Ambulatory Visit (HOSPITAL_COMMUNITY)
Admission: RE | Admit: 2012-06-07 | Discharge: 2012-06-07 | Disposition: A | Discharge status: Home / Self Care | Payer: 59 | Source: Ambulatory Visit | Attending: Family Medicine | Admitting: Family Medicine

## 2012-06-07 ENCOUNTER — Encounter: Payer: Self-pay | Admitting: Family Medicine

## 2012-06-07 ENCOUNTER — Ambulatory Visit (INDEPENDENT_AMBULATORY_CARE_PROVIDER_SITE_OTHER): Payer: 59 | Admitting: Family Medicine

## 2012-06-07 VITALS — BP 133/84 | HR 102 | Temp 98.1°F | Ht 62.5 in | Wt 208.4 lb

## 2012-06-07 DIAGNOSIS — R209 Unspecified disturbances of skin sensation: Secondary | ICD-10-CM

## 2012-06-07 DIAGNOSIS — Z01419 Encounter for gynecological examination (general) (routine) without abnormal findings: Secondary | ICD-10-CM | POA: Insufficient documentation

## 2012-06-07 DIAGNOSIS — E669 Obesity, unspecified: Secondary | ICD-10-CM

## 2012-06-07 DIAGNOSIS — R200 Anesthesia of skin: Secondary | ICD-10-CM

## 2012-06-07 DIAGNOSIS — Z124 Encounter for screening for malignant neoplasm of cervix: Secondary | ICD-10-CM

## 2012-06-07 DIAGNOSIS — N870 Mild cervical dysplasia: Secondary | ICD-10-CM

## 2012-06-07 LAB — POCT GLYCOSYLATED HEMOGLOBIN (HGB A1C): Hemoglobin A1C: 5.4

## 2012-06-07 MED ORDER — GABAPENTIN 100 MG PO CAPS: 100.0000 mg | ORAL_CAPSULE | Freq: Three times a day (TID) | ORAL | Status: DC

## 2012-06-07 NOTE — Progress Notes (Signed)
Emily Hicks is a 27 y.o. female who presents to [facility] today for CPE.  Doing well. Still w/ L sided numbness that is constant. Neuro workup including CT and EEG negative. Typically starts in thigh and radiates to all L side extremities. No associated w/ HA, lightheadedness, vertigo, syncope, falls, muscle weakness. Present since 2012.   Contraception: using Depo. Exercising regularly and drinks milk.   The following portions of the patient's history were reviewed and updated as appropriate: allergies, current medications, past medical history, family and social history, and problem list.  Patient is a nonsmoker.  No past medical history on file.  ROS as above otherwise neg.    Medications reviewed. Current Outpatient Prescriptions  Medication Sig Dispense Refill  . medroxyPROGESTERone (DEPO-PROVERA) 150 MG/ML injection Inject 1 mL (150 mg total) into the muscle every 3 (three) months.  1 mL  11  . fluconazole (DIFLUCAN) 150 MG tablet       . ibuprofen (ADVIL,MOTRIN) 600 MG tablet       . Prenatal Vit-Fe Psac Cmplx-FA (PRENATAL MULTIVITAMIN) 60-1 MG tablet Take 1 tablet by mouth daily with breakfast.         No current facility-administered medications for this visit.    Exam: BP 133/84  Pulse 102  Temp(Src) 98.1 F (36.7 C) (Oral)  Ht 5' 2.5" (1.588 m)  Wt 208 lb 7 oz (94.547 kg)  BMI 37.49 kg/m2 Gen: Well NAD HEENT: EOMI,  MMM Lungs: CTABL Nl WOB Heart: RRR no MRG Abd: NABS, NT, ND Exts: Non edematous BL  LE, warm and well perfused.  Neuro: CN 2-12 intact, proprioception nml, cerebellar fxn nml  No results found for this or any previous visit (from the past 72 hour(s)).

## 2012-06-07 NOTE — Patient Instructions (Addendum)
Thank you for coming in today You are doing very well.  Please call Dr. Gerilyn Pilgrim about the nutrition classes Continue exercising and eating right.  Staying on the depo shot long term is safe so long as you take in plenty of calcium I will let you know if any of your lab work comes back abnormal today Have a great day.

## 2012-06-08 NOTE — Assessment & Plan Note (Addendum)
Unclear etiology.  Chronic condition for pt.  Previous workup by Dr. Charlie Pitter and neurology was nml Folate dn B12 nml Gabapentin 100 QHS -->TID

## 2012-06-08 NOTE — Assessment & Plan Note (Signed)
-   PAP today

## 2012-06-23 ENCOUNTER — Ambulatory Visit (INDEPENDENT_AMBULATORY_CARE_PROVIDER_SITE_OTHER): Payer: 59 | Admitting: *Deleted

## 2012-06-23 DIAGNOSIS — Z309 Encounter for contraceptive management, unspecified: Secondary | ICD-10-CM

## 2012-06-23 MED ORDER — MEDROXYPROGESTERONE ACETATE 150 MG/ML IM SUSP
150.0000 mg | Freq: Once | INTRAMUSCULAR | Status: AC
  Administered 2012-06-23: 150 mg via INTRAMUSCULAR

## 2012-06-23 NOTE — Progress Notes (Signed)
Patient here today for Depo Provera injection.  Depo given today.  Next injection due Aug. 6-20.  Reminder card given.  Gaylene Brooks, RN

## 2012-09-08 ENCOUNTER — Ambulatory Visit (INDEPENDENT_AMBULATORY_CARE_PROVIDER_SITE_OTHER): Payer: 59 | Admitting: *Deleted

## 2012-09-08 DIAGNOSIS — Z309 Encounter for contraceptive management, unspecified: Secondary | ICD-10-CM

## 2012-09-08 MED ORDER — MEDROXYPROGESTERONE ACETATE 150 MG/ML IM SUSP
150.0000 mg | Freq: Once | INTRAMUSCULAR | Status: AC
  Administered 2012-09-08: 150 mg via INTRAMUSCULAR

## 2012-09-08 NOTE — Progress Notes (Signed)
Pt here for depo. Depo given LUOQ. Next depo due oct 22 -nov 5 - reminder card given Wyatt Haste, RN-BSN

## 2012-12-03 ENCOUNTER — Ambulatory Visit (INDEPENDENT_AMBULATORY_CARE_PROVIDER_SITE_OTHER): Payer: 59 | Admitting: *Deleted

## 2012-12-03 DIAGNOSIS — Z309 Encounter for contraceptive management, unspecified: Secondary | ICD-10-CM

## 2012-12-03 MED ORDER — MEDROXYPROGESTERONE ACETATE 150 MG/ML IM SUSP
150.0000 mg | Freq: Once | INTRAMUSCULAR | Status: AC
  Administered 2012-12-03: 150 mg via INTRAMUSCULAR

## 2012-12-03 NOTE — Progress Notes (Signed)
Depo given today next depo due jan 16th- jan 30th

## 2012-12-09 ENCOUNTER — Other Ambulatory Visit: Payer: Self-pay

## 2013-02-25 ENCOUNTER — Ambulatory Visit (INDEPENDENT_AMBULATORY_CARE_PROVIDER_SITE_OTHER): Payer: 59 | Admitting: *Deleted

## 2013-02-25 DIAGNOSIS — Z309 Encounter for contraceptive management, unspecified: Secondary | ICD-10-CM

## 2013-02-25 MED ORDER — MEDROXYPROGESTERONE ACETATE 150 MG/ML IM SUSP
150.0000 mg | Freq: Once | INTRAMUSCULAR | Status: AC
  Administered 2013-02-25: 150 mg via INTRAMUSCULAR

## 2013-02-25 NOTE — Progress Notes (Signed)
Patient in today for depo. Injection given in right ventrogluteal, patient without complaints, site unremarkable. Next depo due April 10 - April 24, patient aware.

## 2013-05-23 ENCOUNTER — Ambulatory Visit (INDEPENDENT_AMBULATORY_CARE_PROVIDER_SITE_OTHER): Payer: 59 | Admitting: *Deleted

## 2013-05-23 DIAGNOSIS — Z309 Encounter for contraceptive management, unspecified: Secondary | ICD-10-CM

## 2013-05-23 MED ORDER — MEDROXYPROGESTERONE ACETATE 150 MG/ML IM SUSP
150.0000 mg | Freq: Once | INTRAMUSCULAR | Status: AC
  Administered 2013-05-23: 150 mg via INTRAMUSCULAR

## 2013-05-23 NOTE — Progress Notes (Signed)
   Pt in for Depo Provera injection.  Pt tolerated Depo injection. Depo given Left upper outer quadrant.  Next injection due July 6 - August 22, 2013.  Reminder card given. Clovis Pu, RN

## 2013-06-07 ENCOUNTER — Encounter: Payer: Self-pay | Admitting: Family Medicine

## 2013-06-07 ENCOUNTER — Ambulatory Visit (INDEPENDENT_AMBULATORY_CARE_PROVIDER_SITE_OTHER): Payer: 59 | Admitting: Family Medicine

## 2013-06-07 VITALS — BP 141/71 | HR 104 | Temp 98.0°F | Ht 62.5 in | Wt 204.0 lb

## 2013-06-07 DIAGNOSIS — L732 Hidradenitis suppurativa: Secondary | ICD-10-CM

## 2013-06-07 MED ORDER — CLINDAMYCIN PHOSPHATE 1 % EX GEL: Freq: Two times a day (BID) | CUTANEOUS | Status: DC

## 2013-06-07 NOTE — Progress Notes (Signed)
Patient ID: Emily Hicks, female   DOB: 1985-12-11, 28 y.o.   MRN: [id number]   Subjective:    Patient ID: Emily Hicks, female    DOB: October 01, 1985, 28 y.o.   MRN: [id number]  HPI  CC: abscess under left arm  # Abscess:  Large abscess developed around February, went to ED outside of [facility] to have it drained when it spontaneously drained on its own. Since that time it has continued to drain, initially Tang fluid but thinks it has turned more yellow and foul smelling recently.   Area is not painful, not bleeding.  She has a history of frequent abscesses that have occasionally required drainage, though usually go away on their own. She has never been on antibiotics for this problem. ROS: no fevers, chills, nausea/vomiting, abdominal pain, numbness/tingling of arm/hand  Review of Systems   See HPI for ROS. Objective:  BP 141/71  Pulse 104  Temp(Src) 98 F (36.7 C) (Oral)  Ht 5' 2.5" (1.588 m)  Wt 204 lb (92.534 kg)  BMI 36.69 kg/m2  General: NAD Skin: left axilla with 0.5cm x 1cm area of exposed skin, not painful or tender, not draining. Anterior to this is a large pore that drains thin white/Teters fluid when pressed. There is no area of induration over this, and it was expressed until no longer draining.     Assessment & Plan:  See Problem List Documentation

## 2013-06-07 NOTE — Patient Instructions (Signed)
It was nice to meet you today.  I believe what you may have is called Hidradenitis supportiva, which is chronic infection/inflammation of the hair follicles and oil glands on your skin.   We will try a topical antibiotic called Clindamycin. Apply a thin film to the affected area twice a day until a few days after the area stops draining.

## 2013-06-07 NOTE — Assessment & Plan Note (Signed)
History and exam seems consistent with chronic hidradenitis suppurtiva which has been occuring since she was age 28. The area of drainage today does not feel like it needs to be I&D'd, as there was no area of fluctuance or induration, and the area was expressed of any fluid inside. Given that this particular lesion has not been resolving on its own like her normal but still appears mild/Stage 1, we will treat with clindamycin 1% gel twice daily until 2-3 days after it stops draining. Information about skin care, diet, weight loss regarding hidradenitis given to patient.

## 2013-08-09 ENCOUNTER — Ambulatory Visit (INDEPENDENT_AMBULATORY_CARE_PROVIDER_SITE_OTHER): Payer: 59 | Admitting: *Deleted

## 2013-08-09 DIAGNOSIS — Z3049 Encounter for surveillance of other contraceptives: Secondary | ICD-10-CM

## 2013-08-09 DIAGNOSIS — Z3042 Encounter for surveillance of injectable contraceptive: Secondary | ICD-10-CM

## 2013-08-09 MED ORDER — MEDROXYPROGESTERONE ACETATE 150 MG/ML IM SUSP
150.0000 mg | Freq: Once | INTRAMUSCULAR | Status: AC
  Administered 2013-08-09: 150 mg via INTRAMUSCULAR

## 2013-08-09 NOTE — Progress Notes (Signed)
   Pt in for Depo Provera injection.  Pt tolerated Depo injection. Depo given Right upper outer quadrant.  Next injection due Sept 22 - Nov 08, 2013.  Reminder card given. Clovis Pu, RN

## 2013-10-26 ENCOUNTER — Ambulatory Visit (INDEPENDENT_AMBULATORY_CARE_PROVIDER_SITE_OTHER): Payer: 59 | Admitting: *Deleted

## 2013-10-26 DIAGNOSIS — Z3042 Encounter for surveillance of injectable contraceptive: Secondary | ICD-10-CM

## 2013-10-26 DIAGNOSIS — Z3049 Encounter for surveillance of other contraceptives: Secondary | ICD-10-CM

## 2013-10-26 MED ORDER — MEDROXYPROGESTERONE ACETATE 150 MG/ML IM SUSP
150.0000 mg | Freq: Once | INTRAMUSCULAR | Status: AC
  Administered 2013-10-26: 150 mg via INTRAMUSCULAR

## 2013-10-26 NOTE — Progress Notes (Signed)
   Pt in for Depo Provera injection.  Pt tolerated Depo injection. Depo given left upper outer quadrant.  Next injection due Dec 9 - Jan 25, 2014.  Reminder card given. Martin, Tamika L, RN  

## 2013-11-11 ENCOUNTER — Emergency Department (HOSPITAL_COMMUNITY)
Admission: EM | Admit: 2013-11-11 | Discharge: 2013-11-11 | Discharge status: Against Medical Advice | Payer: 59 | Attending: Emergency Medicine | Admitting: Emergency Medicine

## 2013-11-11 ENCOUNTER — Encounter (HOSPITAL_COMMUNITY): Payer: Self-pay | Admitting: Emergency Medicine

## 2013-11-11 DIAGNOSIS — R1011 Right upper quadrant pain: Secondary | ICD-10-CM | POA: Insufficient documentation

## 2013-11-11 DIAGNOSIS — R110 Nausea: Secondary | ICD-10-CM | POA: Diagnosis not present

## 2013-11-11 HISTORY — DX: Calculus of gallbladder without cholecystitis without obstruction: K80.20

## 2013-11-11 LAB — URINALYSIS, ROUTINE W REFLEX MICROSCOPIC
Bilirubin Urine: NEGATIVE
GLUCOSE, UA: NEGATIVE mg/dL
Hgb urine dipstick: NEGATIVE
Ketones, ur: NEGATIVE mg/dL
LEUKOCYTES UA: NEGATIVE
Nitrite: NEGATIVE
Protein, ur: NEGATIVE mg/dL
Specific Gravity, Urine: 1.034 — ABNORMAL HIGH (ref 1.005–1.030)
Urobilinogen, UA: 1 mg/dL (ref 0.0–1.0)
pH: 6.5 (ref 5.0–8.0)

## 2013-11-11 LAB — CBC WITH DIFFERENTIAL/PLATELET
Basophils Absolute: 0 10*3/uL (ref 0.0–0.1)
Basophils Relative: 0 % (ref 0–1)
EOS ABS: 0.1 10*3/uL (ref 0.0–0.7)
Eosinophils Relative: 1 % (ref 0–5)
HCT: 34.5 % — ABNORMAL LOW (ref 36.0–46.0)
Hemoglobin: 11.6 g/dL — ABNORMAL LOW (ref 12.0–15.0)
Lymphocytes Relative: 49 % — ABNORMAL HIGH (ref 12–46)
Lymphs Abs: 2.9 10*3/uL (ref 0.7–4.0)
MCH: 26.9 pg (ref 26.0–34.0)
MCHC: 33.6 g/dL (ref 30.0–36.0)
MCV: 79.9 fL (ref 78.0–100.0)
Monocytes Absolute: 0.6 10*3/uL (ref 0.1–1.0)
Monocytes Relative: 10 % (ref 3–12)
NEUTROS PCT: 40 % — AB (ref 43–77)
Neutro Abs: 2.4 10*3/uL (ref 1.7–7.7)
PLATELETS: 285 10*3/uL (ref 150–400)
RBC: 4.32 MIL/uL (ref 3.87–5.11)
RDW: 12.6 % (ref 11.5–15.5)
WBC: 5.9 10*3/uL (ref 4.0–10.5)

## 2013-11-11 LAB — COMPREHENSIVE METABOLIC PANEL
ALK PHOS: 93 U/L (ref 39–117)
ALT: 9 U/L (ref 0–35)
AST: 13 U/L (ref 0–37)
Albumin: 3.9 g/dL (ref 3.5–5.2)
Anion gap: 14 (ref 5–15)
BUN: 11 mg/dL (ref 6–23)
CO2: 23 mEq/L (ref 19–32)
Calcium: 9.5 mg/dL (ref 8.4–10.5)
Chloride: 103 mEq/L (ref 96–112)
Creatinine, Ser: 0.7 mg/dL (ref 0.50–1.10)
GFR calc Af Amer: >90 mL/min (ref >90)
GFR calc non Af Amer: >90 mL/min (ref >90)
Glucose, Bld: 133 mg/dL — ABNORMAL HIGH (ref 70–99)
Potassium: 3.4 mEq/L — ABNORMAL LOW (ref 3.7–5.3)
SODIUM: 140 meq/L (ref 137–147)
TOTAL PROTEIN: 7.8 g/dL (ref 6.0–8.3)
Total Bilirubin: 0.3 mg/dL (ref 0.3–1.2)

## 2013-11-11 LAB — PREGNANCY, URINE: Preg Test, Ur: NEGATIVE

## 2013-11-11 LAB — LIPASE, BLOOD: Lipase: 34 U/L (ref 11–59)

## 2013-11-11 NOTE — ED Notes (Signed)
Pt. reports RUQ pain with nausea unrelieved by prescription pain medicarton  onset this evening , [facility] Sunday diagnosed with gallstones .

## 2013-11-18 ENCOUNTER — Encounter: Payer: Self-pay | Admitting: Family Medicine

## 2013-11-18 ENCOUNTER — Ambulatory Visit (INDEPENDENT_AMBULATORY_CARE_PROVIDER_SITE_OTHER): Payer: 59 | Admitting: Family Medicine

## 2013-11-18 VITALS — BP 137/83 | HR 97 | Temp 98.3°F | Ht 61.0 in | Wt 203.2 lb

## 2013-11-18 DIAGNOSIS — K8050 Calculus of bile duct without cholangitis or cholecystitis without obstruction: Secondary | ICD-10-CM

## 2013-11-18 DIAGNOSIS — K8020 Calculus of gallbladder without cholecystitis without obstruction: Secondary | ICD-10-CM

## 2013-11-18 NOTE — Progress Notes (Signed)
   Subjective:    Patient ID: Emily Hicks, female    DOB: 1985/11/09, 28 y.o.   MRN: [id number]  HPI  Seen 11-06-13 and diagnosed with gallstones in ER.  Had three attacks and presented to ER 11-12-13 but left prior to being seen  RUQ pain: trigger hamburger, fatty foods, no nausea/vomiting.  Review of Systems  Constitutional: Negative for chills and diaphoresis.  Respiratory: Negative for cough and shortness of breath.   Cardiovascular: Negative for leg swelling.  Gastrointestinal: Positive for abdominal pain. Negative for diarrhea, constipation and anal bleeding.  Endocrine: Negative for cold intolerance and heat intolerance.  Genitourinary: Negative for dysuria, urgency, decreased urine volume and difficulty urinating.  Neurological: Negative for headaches.       Objective:   Physical Exam  Constitutional: She appears well-developed and well-nourished. No distress.  HENT:  Head: Normocephalic and atraumatic.  Eyes: Conjunctivae are normal. Pupils are equal, round, and reactive to light.  Neck: Normal range of motion. Neck supple. No thyromegaly present.  Cardiovascular: Normal rate.   Pulmonary/Chest: Effort normal. No respiratory distress.  Abdominal: Soft. She exhibits no distension. There is no tenderness.  Musculoskeletal: She exhibits no edema.  Skin: Skin is warm and dry. No rash noted. She is not diaphoretic. No erythema.  Psychiatric: She has a normal mood and affect. Her behavior is normal.          Assessment & Plan:  Patient is 28 y.o. here with RUQ pain likely secondary to biliary colic - referred to general surgery - cholecystitis warnings discussed - naproxen prn pain, diet/exercise/weight loss/low fat diet - blood pressure wnl but elevated for patient, advised the above. Perry Mount, MD 9:32 AM

## 2013-12-05 ENCOUNTER — Encounter: Payer: Self-pay | Admitting: Family Medicine

## 2013-12-14 NOTE — Progress Notes (Signed)
Please put orders in Epic surgery 12-28-13 pre op 12-22-13 Thanks 

## 2013-12-15 ENCOUNTER — Other Ambulatory Visit (INDEPENDENT_AMBULATORY_CARE_PROVIDER_SITE_OTHER): Payer: Self-pay | Admitting: General Surgery

## 2013-12-21 ENCOUNTER — Other Ambulatory Visit (HOSPITAL_COMMUNITY): Payer: Self-pay | Admitting: *Deleted

## 2013-12-21 NOTE — Patient Instructions (Addendum)
Emily DublinJamie J Hicks  12/21/2013                           YOUR PROCEDURE IS SCHEDULED ON:  12/28/13                ENTER FROM [address] - GO TO [address] DECK               LOOK FOR [employer]  / [employer]                              FOLLOW  SIGNS TO SHORT STAY CENTER                 ARRIVE AT SHORT STAY AT:  9:00 AM               CALL THIS NUMBER IF ANY PROBLEMS THE DAY OF SURGERY :               [phone]                                REMEMBER:   Do not eat food or drink liquids AFTER MIDNIGHT                  Take these medicines the morning of surgery with               A SIPS OF WATER :  none                  NO ASPIRIN / HERBAL MEDS 5 DAYS PREOP      Do not wear jewelry, make-up   Do not wear lotions, powders, or perfumes.   Do not shave legs or underarms 12 hrs. before surgery (men may shave face)  Do not bring valuables to the hospital.  Contacts, dentures or bridgework may not be worn into surgery.  Leave suitcase in the car. After surgery it may be brought to your room.  For patients admitted to the hospital more than one night, checkout time is            11:00 AM                                                       The day of discharge.   Patients discharged the day of surgery will not be allowed to drive home.            If going home same day of surgery, must have someone stay with you              FIRST 24 hrs at home and arrange for some one to drive you              home from hospital.   ________________________________________________________________________  [facility] - PREPARING FOR SURGERY  Before surgery, you can play an important role.  Because skin is not sterile, your skin needs to be as free of germs as possible.  You can reduce the number of germs on your skin by washing with CHG (chlorahexidine gluconate) soap before surgery.   CHG is an antiseptic cleaner which kills germs and bonds with the skin to continue killing germs even after washing. Please DO NOT use if you have an allergy to CHG or antibacterial soaps.  If your skin becomes reddened/irritated stop using the CHG and inform your nurse when you arrive at Short Stay. Do not shave (including legs and underarms) for at least 48 hours prior to the first CHG shower.  You may shave your face. Please follow these instructions carefully:   1.  Shower with CHG Soap the night before surgery and the  morning of Surgery.   2.  If you choose to wash your hair, wash your hair first as usual with your  normal  Shampoo.   3.  After you shampoo, rinse your hair and body thoroughly to remove the  shampoo.                                         4.  Use CHG as you would any other liquid soap.  You can apply chg directly  to the skin and wash . Gently wash with scrungie or clean wascloth    5.  Apply the CHG Soap to your body ONLY FROM THE NECK DOWN.   Do not use on open                           Wound or open sores. Avoid contact with eyes, ears mouth and genitals (private parts).                        Genitals (private parts) with your normal soap.              6.  Wash thoroughly, paying special attention to the area where your surgery  will be performed.   7.  Thoroughly rinse your body with warm water from the neck down.   8.  DO NOT shower/wash with your normal soap after using and rinsing off  the CHG Soap .                9.  Pat yourself dry with a clean towel.             10.  Wear clean pajamas.             11.  Place clean sheets on your bed the night of your first shower and do not  sleep with pets.  Day of Surgery : Do not apply any lotions/deodorants the morning of surgery.  Please wear clean clothes to the hospital/surgery center.  FAILURE TO FOLLOW THESE INSTRUCTIONS MAY RESULT IN THE CANCELLATION OF YOUR SURGERY    PATIENT  SIGNATURE_________________________________  ______________________________________________________________________

## 2013-12-22 ENCOUNTER — Encounter (HOSPITAL_COMMUNITY)
Admission: RE | Admit: 2013-12-22 | Discharge: 2013-12-22 | Disposition: A | Discharge status: Home / Self Care | Payer: 59 | Source: Ambulatory Visit | Attending: General Surgery | Admitting: General Surgery

## 2013-12-22 ENCOUNTER — Encounter (HOSPITAL_COMMUNITY): Payer: Self-pay

## 2013-12-22 DIAGNOSIS — Z01812 Encounter for preprocedural laboratory examination: Secondary | ICD-10-CM | POA: Insufficient documentation

## 2013-12-22 HISTORY — DX: Gastro-esophageal reflux disease without esophagitis: K21.9

## 2013-12-22 LAB — CBC WITH DIFFERENTIAL/PLATELET
BASOS PCT: 1 % (ref 0–1)
Basophils Absolute: 0 10*3/uL (ref 0.0–0.1)
Eosinophils Absolute: 0.1 10*3/uL (ref 0.0–0.7)
Eosinophils Relative: 1 % (ref 0–5)
HCT: 35.8 % — ABNORMAL LOW (ref 36.0–46.0)
Hemoglobin: 11.7 g/dL — ABNORMAL LOW (ref 12.0–15.0)
Lymphocytes Relative: 33 % (ref 12–46)
Lymphs Abs: 1.5 10*3/uL (ref 0.7–4.0)
MCH: 27 pg (ref 26.0–34.0)
MCHC: 32.7 g/dL (ref 30.0–36.0)
MCV: 82.5 fL (ref 78.0–100.0)
Monocytes Absolute: 0.5 10*3/uL (ref 0.1–1.0)
Monocytes Relative: 10 % (ref 3–12)
NEUTROS ABS: 2.5 10*3/uL (ref 1.7–7.7)
NEUTROS PCT: 55 % (ref 43–77)
Platelets: 289 10*3/uL (ref 150–400)
RBC: 4.34 MIL/uL (ref 3.87–5.11)
RDW: 12.6 % (ref 11.5–15.5)
WBC: 4.6 10*3/uL (ref 4.0–10.5)

## 2013-12-22 LAB — COMPREHENSIVE METABOLIC PANEL
ALBUMIN: 3.8 g/dL (ref 3.5–5.2)
ALK PHOS: 93 U/L (ref 39–117)
ALT: 10 U/L (ref 0–35)
AST: 12 U/L (ref 0–37)
Anion gap: 10 (ref 5–15)
BUN: 8 mg/dL (ref 6–23)
CO2: 25 mEq/L (ref 19–32)
Calcium: 9.8 mg/dL (ref 8.4–10.5)
Chloride: 108 mEq/L (ref 96–112)
Creatinine, Ser: 0.71 mg/dL (ref 0.50–1.10)
GFR calc Af Amer: >90 mL/min (ref >90)
GFR calc non Af Amer: >90 mL/min (ref >90)
Glucose, Bld: 93 mg/dL (ref 70–99)
POTASSIUM: 4.3 meq/L (ref 3.7–5.3)
SODIUM: 143 meq/L (ref 137–147)
Total Bilirubin: 0.5 mg/dL (ref 0.3–1.2)
Total Protein: 7.8 g/dL (ref 6.0–8.3)

## 2013-12-22 LAB — HCG, SERUM, QUALITATIVE: PREG SERUM: NEGATIVE

## 2013-12-25 NOTE — H&P (Addendum)
Emily Hicks  Location: [facility] Patient #: [id number] DOB: 22-Mar-1985 Single / Language: [place] / Race: Black or African American Female     History of Present Illness Patient words: gallbladder.  The patient is a 28 year old female who presents for evaluation of gall stones. This patient was referred by Dr. Loreta Ave at [facility] for management of symptomatic gallstones. she has a 3 to four-year history of intermittent spells of right upper quadrant and right flank pain he this will occur every few months. On November 06, 2013, she was visiting [address] Had a hamburger and this set off a severe attack of right upper quadrant and right flank pain. This lasted about 6 hours and then resolved after getting some morphine in the emergency department. She states that they told her that her lab work was normal. I have the ultrasound report which confirmed gallstones but no inflammatory change. she is asymptomatic today. Only comorbidities are BMI of 37. Single but engaged to be married. One child. She has a job in Audiological scientist.   Past Surgical History  No pertinent past surgical history  Diagnostic Studies History  Pap Smear 1-5 years ago  Medication History No Current Medications  Social History Caffeine use Carbonated beverages. No alcohol use No drug use Tobacco use Never smoker.  Family History  Hypertension Father, Mother.  Pregnancy / Birth History  Irregular periods Maternal age 74-25 Para 1  Review of Systems  Skin Not Present- Change in Wart/Mole, Dryness, Hives, Jaundice, New Lesions, Non-Healing Wounds, Rash and Ulcer. HEENT Present- Wears glasses/contact lenses. Not Present- Earache, Hearing Loss, Hoarseness, Nose Bleed, Oral Ulcers, Ringing in the Ears, Seasonal Allergies, Sinus Pain, Sore Throat, Visual Disturbances and Yellow Eyes. Respiratory Not Present- Bloody sputum, Chronic Cough, Difficulty  Breathing, Snoring and Wheezing. Breast Not Present- Breast Mass, Breast Pain, Nipple Discharge and Skin Changes. Cardiovascular Not Present- Chest Pain, Difficulty Breathing Lying Down, Leg Cramps, Palpitations, Rapid Heart Rate, Shortness of Breath and Swelling of Extremities. Gastrointestinal Not Present- Abdominal Pain, Bloating, Bloody Stool, Change in Bowel Habits, Chronic diarrhea, Constipation, Difficulty Swallowing, Excessive gas, Gets full quickly at meals, Hemorrhoids, Indigestion, Nausea, Rectal Pain and Vomiting. Female Genitourinary Not Present- Frequency, Nocturia, Painful Urination, Pelvic Pain and Urgency. Musculoskeletal Present- Muscle Weakness. Not Present- Back Pain, Joint Pain, Joint Stiffness, Muscle Pain and Swelling of Extremities. Neurological Present- Tingling. Not Present- Decreased Memory, Fainting, Headaches, Numbness, Seizures, Tremor, Trouble walking and Weakness. Psychiatric Not Present- Anxiety, Bipolar, Change in Sleep Pattern, Depression, Fearful and Frequent crying. Endocrine Not Present- Cold Intolerance, Excessive Hunger, Hair Changes, Heat Intolerance, Hot flashes and New Diabetes. Hematology Not Present- Easy Bruising, Excessive bleeding, Gland problems, HIV and Persistent Infections.   Vitals  12/05/2013 3:51 PM Weight: 201 lb Height: 61in Body Surface Area: 1.98 m Body Mass Index: 37.98 kg/m Temp.: 97.38F(Temporal)  Pulse: 72 (Regular)  BP: 130/74 (Sitting, Left Arm, Standard)    Physical Exam  General Note: Pleasant. Cooperative. BMI 37. No distress   Head and Neck Note: No adenopathy or mass.   Chest and Lung Exam Note: Clear to auscultation bilaterally. No chest wall tenderness   Abdomen Note: Abdominal exam reveals obesity. No scars. Umbilicus normal.     Assessment & Plan   GALLSTONES (574.20  K80.20) Current Plans  Schedule for Surgery You have gallstones and you are having gallbladder attacks. This  will continue untilsomething is done You'll be scheduled for laparoscopic cholecystectomy with cholangiogram, possible open cholecystectomy.  OBESITY (BMI 30-39.9) (278.00  E66.9)    Daxtyn Rottenberg M. Derrell LollingIngram, M.D., [facility] General and Minimally invasive Surgery Breast and Colorectal Surgery Office:   [phone] Pager:   [phone]

## 2013-12-28 ENCOUNTER — Ambulatory Visit (HOSPITAL_COMMUNITY): Payer: 59 | Admitting: Certified Registered Nurse Anesthetist

## 2013-12-28 ENCOUNTER — Encounter (HOSPITAL_COMMUNITY)
Admission: RE | Disposition: A | Discharge status: Home / Self Care | Payer: Self-pay | Source: Ambulatory Visit | Attending: General Surgery

## 2013-12-28 ENCOUNTER — Encounter (HOSPITAL_COMMUNITY): Payer: Self-pay | Admitting: *Deleted

## 2013-12-28 ENCOUNTER — Ambulatory Visit (HOSPITAL_COMMUNITY)
Admission: RE | Admit: 2013-12-28 | Discharge: 2013-12-28 | Disposition: A | Discharge status: Home / Self Care | Payer: 59 | Source: Ambulatory Visit | Attending: General Surgery | Admitting: General Surgery

## 2013-12-28 ENCOUNTER — Ambulatory Visit: Payer: 59 | Admitting: Family Medicine

## 2013-12-28 DIAGNOSIS — K8010 Calculus of gallbladder with chronic cholecystitis without obstruction: Secondary | ICD-10-CM | POA: Diagnosis present

## 2013-12-28 DIAGNOSIS — K8020 Calculus of gallbladder without cholecystitis without obstruction: Secondary | ICD-10-CM

## 2013-12-28 DIAGNOSIS — Z6830 Body mass index (BMI) 30.0-30.9, adult: Secondary | ICD-10-CM | POA: Diagnosis not present

## 2013-12-28 HISTORY — PX: CHOLECYSTECTOMY: SHX55

## 2013-12-28 SURGERY — LAPAROSCOPIC CHOLECYSTECTOMY WITH INTRAOPERATIVE CHOLANGIOGRAM
Anesthesia: General | Site: Abdomen

## 2013-12-28 MED ORDER — HYDROMORPHONE HCL 1 MG/ML IJ SOLN: 0.2500 mg | INTRAMUSCULAR | Status: DC | PRN

## 2013-12-28 MED ORDER — ONDANSETRON HCL 4 MG/2ML IJ SOLN
INTRAMUSCULAR | Status: AC
  Filled 2013-12-28: qty 2

## 2013-12-28 MED ORDER — SODIUM CHLORIDE 0.9 % IV SOLN: INTRAVENOUS | Status: DC

## 2013-12-28 MED ORDER — PROPOFOL 10 MG/ML IV BOLUS
INTRAVENOUS | Status: AC
  Filled 2013-12-28: qty 20

## 2013-12-28 MED ORDER — SUCCINYLCHOLINE CHLORIDE 20 MG/ML IJ SOLN
INTRAMUSCULAR | Status: DC | PRN
  Administered 2013-12-28: 100 mg via INTRAVENOUS

## 2013-12-28 MED ORDER — HYDROCODONE-ACETAMINOPHEN 5-325 MG PO TABS: 1.0000 | ORAL_TABLET | Freq: Four times a day (QID) | ORAL | Status: DC | PRN

## 2013-12-28 MED ORDER — LACTATED RINGERS IV SOLN
INTRAVENOUS | Status: DC
  Administered 2013-12-28: 1000 mL via INTRAVENOUS

## 2013-12-28 MED ORDER — CEFAZOLIN SODIUM-DEXTROSE 2-3 GM-% IV SOLR
INTRAVENOUS | Status: AC
  Filled 2013-12-28: qty 50

## 2013-12-28 MED ORDER — ROCURONIUM BROMIDE 100 MG/10ML IV SOLN
INTRAVENOUS | Status: DC | PRN
  Administered 2013-12-28: 30 mg via INTRAVENOUS

## 2013-12-28 MED ORDER — ROCURONIUM BROMIDE 100 MG/10ML IV SOLN
INTRAVENOUS | Status: AC
  Filled 2013-12-28: qty 1

## 2013-12-28 MED ORDER — BUPIVACAINE-EPINEPHRINE 0.5% -1:200000 IJ SOLN
INTRAMUSCULAR | Status: DC | PRN
  Administered 2013-12-28: 11 mL

## 2013-12-28 MED ORDER — NEOSTIGMINE METHYLSULFATE 10 MG/10ML IV SOLN
INTRAVENOUS | Status: AC
  Filled 2013-12-28: qty 1

## 2013-12-28 MED ORDER — SODIUM CHLORIDE 0.9 % IV SOLN: 250.0000 mL | INTRAVENOUS | Status: DC | PRN

## 2013-12-28 MED ORDER — NEOSTIGMINE METHYLSULFATE 10 MG/10ML IV SOLN
INTRAVENOUS | Status: DC | PRN
  Administered 2013-12-28: 4 mg via INTRAVENOUS

## 2013-12-28 MED ORDER — SODIUM CHLORIDE 0.9 % IJ SOLN: 3.0000 mL | Freq: Two times a day (BID) | INTRAMUSCULAR | Status: DC

## 2013-12-28 MED ORDER — FENTANYL CITRATE 0.05 MG/ML IJ SOLN: 25.0000 ug | INTRAMUSCULAR | Status: DC | PRN

## 2013-12-28 MED ORDER — DEXAMETHASONE SODIUM PHOSPHATE 10 MG/ML IJ SOLN
INTRAMUSCULAR | Status: AC
  Filled 2013-12-28: qty 1

## 2013-12-28 MED ORDER — ESMOLOL HCL 10 MG/ML IV SOLN
INTRAVENOUS | Status: AC
  Filled 2013-12-28: qty 10

## 2013-12-28 MED ORDER — RINGERS IRRIGATION IR SOLN
Status: DC | PRN
  Administered 2013-12-28: 1000 mL

## 2013-12-28 MED ORDER — ACETAMINOPHEN 325 MG PO TABS: 650.0000 mg | ORAL_TABLET | ORAL | Status: DC | PRN

## 2013-12-28 MED ORDER — GLYCOPYRROLATE 0.2 MG/ML IJ SOLN
INTRAMUSCULAR | Status: DC | PRN
  Administered 2013-12-28: .6 mg via INTRAVENOUS

## 2013-12-28 MED ORDER — FENTANYL CITRATE 0.05 MG/ML IJ SOLN
INTRAMUSCULAR | Status: DC | PRN
  Administered 2013-12-28: 100 ug via INTRAVENOUS
  Administered 2013-12-28 (×3): 50 ug via INTRAVENOUS

## 2013-12-28 MED ORDER — ACETAMINOPHEN 650 MG RE SUPP
650.0000 mg | RECTAL | Status: DC | PRN
  Filled 2013-12-28: qty 1

## 2013-12-28 MED ORDER — BUPIVACAINE-EPINEPHRINE 0.5% -1:200000 IJ SOLN
INTRAMUSCULAR | Status: AC
  Filled 2013-12-28: qty 1

## 2013-12-28 MED ORDER — DEXAMETHASONE SODIUM PHOSPHATE 10 MG/ML IJ SOLN
INTRAMUSCULAR | Status: DC | PRN
  Administered 2013-12-28: 10 mg via INTRAVENOUS

## 2013-12-28 MED ORDER — 0.9 % SODIUM CHLORIDE (POUR BTL) OPTIME
TOPICAL | Status: DC | PRN
  Administered 2013-12-28: 1000 mL

## 2013-12-28 MED ORDER — FENTANYL CITRATE 0.05 MG/ML IJ SOLN
INTRAMUSCULAR | Status: AC
  Filled 2013-12-28: qty 5

## 2013-12-28 MED ORDER — MIDAZOLAM HCL 2 MG/2ML IJ SOLN
INTRAMUSCULAR | Status: AC
  Filled 2013-12-28: qty 2

## 2013-12-28 MED ORDER — SODIUM CHLORIDE 0.9 % IJ SOLN: 3.0000 mL | INTRAMUSCULAR | Status: DC | PRN

## 2013-12-28 MED ORDER — GLYCOPYRROLATE 0.2 MG/ML IJ SOLN
INTRAMUSCULAR | Status: AC
  Filled 2013-12-28: qty 3

## 2013-12-28 MED ORDER — CEFAZOLIN SODIUM-DEXTROSE 2-3 GM-% IV SOLR
2.0000 g | INTRAVENOUS | Status: AC
  Administered 2013-12-28: 2 g via INTRAVENOUS

## 2013-12-28 MED ORDER — LIDOCAINE HCL (CARDIAC) 20 MG/ML IV SOLN
INTRAVENOUS | Status: AC
  Filled 2013-12-28: qty 5

## 2013-12-28 MED ORDER — CHLORHEXIDINE GLUCONATE 4 % EX LIQD: 1.0000 "application " | Freq: Once | CUTANEOUS | Status: DC

## 2013-12-28 MED ORDER — PROPOFOL 10 MG/ML IV BOLUS
INTRAVENOUS | Status: DC | PRN
  Administered 2013-12-28: 200 mg via INTRAVENOUS

## 2013-12-28 MED ORDER — OXYCODONE HCL 5 MG PO TABS
5.0000 mg | ORAL_TABLET | ORAL | Status: DC | PRN
  Administered 2013-12-28: 5 mg via ORAL
  Filled 2013-12-28: qty 1

## 2013-12-28 SURGICAL SUPPLY — 30 items
APPLIER CLIP ROT 10 11.4 M/L (STAPLE) ×2
BENZOIN TINCTURE PRP APPL 2/3 (GAUZE/BANDAGES/DRESSINGS) IMPLANT
CANISTER SUCTION 2500CC (MISCELLANEOUS) ×2 IMPLANT
CLIP APPLIE ROT 10 11.4 M/L (STAPLE) ×1 IMPLANT
COVER MAYO STAND STRL (DRAPES) ×2 IMPLANT
DECANTER SPIKE VIAL GLASS SM (MISCELLANEOUS) ×2 IMPLANT
DRAPE C-ARM 42X120 X-RAY (DRAPES) ×2 IMPLANT
DRAPE LAPAROSCOPIC ABDOMINAL (DRAPES) ×2 IMPLANT
ELECT REM PT RETURN 9FT ADLT (ELECTROSURGICAL) ×2
ELECTRODE REM PT RTRN 9FT ADLT (ELECTROSURGICAL) ×1 IMPLANT
GLOVE EUDERMIC 7 POWDERFREE (GLOVE) ×2 IMPLANT
GOWN STRL REUS W/TWL XL LVL3 (GOWN DISPOSABLE) ×8 IMPLANT
HEMOSTAT SNOW SURGICEL 2X4 (HEMOSTASIS) IMPLANT
KIT BASIN OR (CUSTOM PROCEDURE TRAY) ×2 IMPLANT
LIQUID BAND (GAUZE/BANDAGES/DRESSINGS) ×2 IMPLANT
POUCH SPECIMEN RETRIEVAL 10MM (ENDOMECHANICALS) ×2 IMPLANT
SCISSORS LAP 5X35 DISP (ENDOMECHANICALS) ×2 IMPLANT
SET CHOLANGIOGRAPH MIX (MISCELLANEOUS) ×2 IMPLANT
SET IRRIG TUBING LAPAROSCOPIC (IRRIGATION / IRRIGATOR) ×2 IMPLANT
SLEEVE XCEL OPT CAN 5 100 (ENDOMECHANICALS) ×2 IMPLANT
SOLUTION ANTI FOG 6CC (MISCELLANEOUS) ×2 IMPLANT
STRIP CLOSURE SKIN 1/2X4 (GAUZE/BANDAGES/DRESSINGS) IMPLANT
SUT MNCRL AB 4-0 PS2 18 (SUTURE) ×2 IMPLANT
TOWEL OR 17X26 10 PK STRL BLUE (TOWEL DISPOSABLE) ×2 IMPLANT
TOWEL OR NON WOVEN STRL DISP B (DISPOSABLE) ×2 IMPLANT
TRAY LAPAROSCOPIC (CUSTOM PROCEDURE TRAY) ×2 IMPLANT
TROCAR BLADELESS OPT 5 100 (ENDOMECHANICALS) ×2 IMPLANT
TROCAR XCEL BLUNT TIP 100MML (ENDOMECHANICALS) ×2 IMPLANT
TROCAR XCEL NON-BLD 11X100MML (ENDOMECHANICALS) ×2 IMPLANT
TUBING INSUFFLATION 10FT LAP (TUBING) ×2 IMPLANT

## 2013-12-28 NOTE — Transfer of Care (Signed)
Immediate Anesthesia Transfer of Care Note  Patient: Emily Hicks  Procedure(s) Performed: Procedure(s): LAPAROSCOPIC CHOLECYSTECTOMY (N/A)  Patient Location: PACU  Anesthesia Type:General  Level of Consciousness: awake, alert  and oriented  Airway & Oxygen Therapy: Patient Spontanous Breathing and Patient connected to face mask oxygen  Post-op Assessment: Report given to PACU RN and Post -op Vital signs reviewed and stable  Post vital signs: Reviewed and stable  Complications: No apparent anesthesia complications

## 2013-12-28 NOTE — Interval H&P Note (Signed)
History and Physical Interval Note:  12/28/2013 9:50 AM  Emily Hicks  has presented today for surgery, with the diagnosis of gallstones  The various methods of treatment have been discussed with the patient and family. After consideration of risks, benefits and other options for treatment, the patient has consented to  Procedure(s): LAPAROSCOPIC CHOLECYSTECTOMY WITH INTRAOPERATIVE CHOLANGIOGRAM POSSIBLE OPEN (N/A) as a surgical intervention .  The patient's history has been reviewed, patient examined, no change in status, stable for surgery.  I have reviewed the patient's chart and labs.  Questions were answered to the patient's satisfaction.     Ernestene Mention

## 2013-12-28 NOTE — Discharge Instructions (Signed)
[facility] ______[facility] °LAPAROSCOPIC SURGERY: POST OP INSTRUCTIONS °Always review your discharge instruction sheet given to you by the facility where your surgery was performed. °IF YOU HAVE DISABILITY OR FAMILY LEAVE FORMS, YOU MUST BRING THEM TO THE OFFICE FOR PROCESSING.   °DO NOT GIVE THEM TO YOUR DOCTOR. ° °1. A prescription for pain medication may be given to you upon discharge.  Take your pain medication as prescribed, if needed.  If narcotic pain medicine is not needed, then you may take acetaminophen (Tylenol) or ibuprofen (Advil) as needed. °2. Take your usually prescribed medications unless otherwise directed. °3. If you need a refill on your pain medication, please contact your pharmacy.  They will contact our office to request authorization. Prescriptions will not be filled after 5pm or on week-ends. °4. You should follow a light diet the first few days after arrival home, such as soup and crackers, etc.  Be sure to include lots of fluids daily. °5. Most patients will experience some swelling and bruising in the area of the incisions.  Ice packs will help.  Swelling and bruising can take several days to resolve.  °6. It is common to experience some constipation if taking pain medication after surgery.  Increasing fluid intake and taking a stool softener (such as Colace) will usually help or prevent this problem from occurring.  A mild laxative (Milk of Magnesia or Miralax) should be taken according to package instructions if there are no bowel movements after 48 hours. °7. Unless discharge instructions indicate otherwise, you may remove your bandages 24-48 hours after surgery, and you may shower at that time.  You may have steri-strips (small skin tapes) in place directly over the incision.  These strips should be left on the skin for 7-10 days.  If your surgeon used skin glue on the incision, you may shower in 24 hours.  The glue will flake off over the next 2-3 weeks.  Any sutures or  staples will be removed at the office during your follow-up visit. °8. ACTIVITIES:  You may resume regular (light) daily activities beginning the next day--such as daily self-care, walking, climbing stairs--gradually increasing activities as tolerated.  You may have sexual intercourse when it is comfortable.  Refrain from any heavy lifting or straining until approved by your doctor. °a. You may drive when you are no longer taking prescription pain medication, you can comfortably wear a seatbelt, and you can safely maneuver your car and apply brakes. °b. RETURN TO WORK:  __________________________________________________________ °9. You should see your doctor in the office for a follow-up appointment approximately 2-3 weeks after your surgery.  Make sure that you call for this appointment within a day or two after you arrive home to insure a convenient appointment time. °10. OTHER INSTRUCTIONS: __________________________________________________________________________________________________________________________ __________________________________________________________________________________________________________________________ °WHEN TO CALL YOUR DOCTOR: °1. Fever over 101.0 °2. Inability to urinate °3. Continued bleeding from incision. °4. Increased pain, redness, or drainage from the incision. °5. Increasing abdominal pain ° °The clinic staff is available to answer your questions during regular business hours.  Please don’t hesitate to call and ask to speak to one of the nurses for clinical concerns.  If you have a medical emergency, go to the nearest emergency room or call 911.  A surgeon from [facility] is always on call at the hospital. °[address], [address], [place], [place]  [address] ? [address] [address], [place], [place]   [address] °[phone] ? [phone] ? FAX [phone] °Web site:   [email or web address] °

## 2013-12-28 NOTE — Anesthesia Postprocedure Evaluation (Signed)
  Anesthesia Post-op Note  Patient: Emily Hicks  Procedure(s) Performed: Procedure(s): LAPAROSCOPIC CHOLECYSTECTOMY (N/A) Patient is awake and responsive. Pain and nausea are reasonably well controlled. Vital signs are stable and clinically acceptable. Oxygen saturation is clinically acceptable. There are no apparent anesthetic complications at this time. Patient is ready for discharge.

## 2013-12-28 NOTE — Op Note (Signed)
Patient Name:           Emily Hicks   Date of Surgery:        12/28/2013  Pre op Diagnosis:      Chronic cholecystitis with cholelithiasis  Post op Diagnosis:    Chronic cholecystitis with cholelithiasis  Procedure:                 Laparoscopic cholecystectomy  Surgeon:                     Angelia Mould. Derrell Lolling, M.D., FACS  Assistant:                      OR staff  Operative Indications:   The patient is a 28 year old female who presents for evaluation of gall stones. This patient was referred by Dr. Loreta Ave at [facility] for management of symptomatic gallstones. she has a 3 to four-year history of intermittent spells of right upper quadrant and right flank pain he this will occur every few months. On November 06, 2013, she was visiting [address] Had a hamburger and this set off a severe attack of right upper quadrant and right flank pain. This lasted about 6 hours and then resolved after getting some morphine in the emergency department. She states that they told her that her lab work was normal. I have the ultrasound report which confirmed gallstones but no inflammatory change. Only comorbidities are BMI of 37.  Operative Findings:       Gallbladder was thin-walled but was chronically inflamed, discolored, and had moderate adhesions to it. The anatomy of the cystic duct and cystic artery were conventional. The liver was healthy. We did not perform a cholangiogram since preoperative liver function tests were normal and we had a good critical view of the cystic artery and cystic duct. Small bowel large bowel duodenum and stomach looks normal.  Procedure in Detail:          Following the induction of general endotracheal anesthesia the patient's abdomen was prepped and draped in a sterile fashion. Intravenous antibiotics were given and a surgical timeout was performed. 0.5% Marcaine with epinephrine was used as a local infiltration anesthetic. A vertical incision was  made in the lower rim of the umbilicus. The fascia was incised in the midline and the abdominal cavity entered under direct vision. An 11 mm Hassan trocar was inserted and a Pneumoperitoneum was created and video camera was inserted. An 11 mm trochar placed in subxiphoid region and two 5 mm trochars placed in the right upper quadrant.     The gallbladder fundus was elevated. Adhesions were taken down. I incised the peritoneum over the neck of the gallbladder. I stripped the lymph node away. I created a large window behind the cystic duct and the cystic artery and I could see both structures clearly. The cystic duct and cystic artery were secured with multiple medical clips and divided. The gallbladder was dissected from its bed with electrocautery placed in a specimen bag and removed. The operative field was inspected, cauterized a little bit. Copious irrigation was performed and irrigation fluid was completely clear. There is no bleeding or bile leak.      The trochars were removed. There was no bleeding. Pneumoperitoneum was released. The fascia at the umbilicus was closed with 0 Vicryl sutures. Skin incisions were closed with subcuticular sutures of 4-0 Monocryl and Dermabond. The patient tolerated the procedure well  was taken to PACU in stable condition. EBL 10 mL. Counts correct. Competitions none.           Angelia MouldHaywood M. Derrell LollingIngram, M.D., FACS General and Minimally Invasive Surgery Breast and Colorectal Surgery  12/28/2013 11:15 AM

## 2013-12-28 NOTE — Anesthesia Preprocedure Evaluation (Signed)
Anesthesia Evaluation  Patient identified by MRN, date of birth, ID band Patient awake    Reviewed: Allergy & Precautions, H&P , Patient's Chart, lab work & pertinent test results, reviewed documented beta blocker date and time   Airway Mallampati: II  TM Distance: >3 FB Neck ROM: full    Dental no notable dental hx.    Pulmonary  breath sounds clear to auscultation  Pulmonary exam normal       Cardiovascular Rhythm:regular Rate:Normal     Neuro/Psych    GI/Hepatic GERD-  ,  Endo/Other  Morbid obesity  Renal/GU      Musculoskeletal   Abdominal   Peds  Hematology   Anesthesia Other Findings   Reproductive/Obstetrics                             Anesthesia Physical Anesthesia Plan  ASA: III  Anesthesia Plan: General   Post-op Pain Management:    Induction: Intravenous  Airway Management Planned: Oral ETT  Additional Equipment:   Intra-op Plan:   Post-operative Plan: Extubation in OR  Informed Consent: I have reviewed the patients History and Physical, chart, labs and discussed the procedure including the risks, benefits and alternatives for the proposed anesthesia with the patient or authorized representative who has indicated his/her understanding and acceptance.   Dental Advisory Given and Dental advisory given  Plan Discussed with: CRNA and Surgeon  Anesthesia Plan Comments: (  Discussed general anesthesia, including possible nausea, instrumentation of airway, sore throat,pulmonary aspiration, etc. I asked if the were any outstanding questions, or  concerns before we proceeded. )        Anesthesia Quick Evaluation

## 2013-12-30 ENCOUNTER — Encounter (HOSPITAL_COMMUNITY): Payer: Self-pay | Admitting: General Surgery

## 2014-02-01 ENCOUNTER — Ambulatory Visit (INDEPENDENT_AMBULATORY_CARE_PROVIDER_SITE_OTHER): Payer: 59 | Admitting: *Deleted

## 2014-02-01 DIAGNOSIS — Z3042 Encounter for surveillance of injectable contraceptive: Secondary | ICD-10-CM

## 2014-02-01 LAB — POCT URINE PREGNANCY: PREG TEST UR: NEGATIVE

## 2014-02-01 MED ORDER — MEDROXYPROGESTERONE ACETATE 150 MG/ML IM SUSP
150.0000 mg | Freq: Once | INTRAMUSCULAR | Status: AC
  Administered 2014-02-01: 150 mg via INTRAMUSCULAR

## 2014-02-01 NOTE — Progress Notes (Signed)
   Pt late for Depo Provera injection.  Pregnancy test ordered; results negative. Pt tolerated Depo injection. Depo given right upper outer quadrant.  Next injection due March 17-May 04, 2014.  Reminder card given. Clovis Pu, RN

## 2014-04-24 ENCOUNTER — Ambulatory Visit (INDEPENDENT_AMBULATORY_CARE_PROVIDER_SITE_OTHER): Payer: 59 | Admitting: *Deleted

## 2014-04-24 DIAGNOSIS — Z3042 Encounter for surveillance of injectable contraceptive: Secondary | ICD-10-CM

## 2014-04-24 MED ORDER — MEDROXYPROGESTERONE ACETATE 150 MG/ML IM SUSP
150.0000 mg | Freq: Once | INTRAMUSCULAR | Status: AC
  Administered 2014-04-24: 150 mg via INTRAMUSCULAR

## 2014-04-24 NOTE — Progress Notes (Signed)
Patient in today for depo provera. Injection given in left ventrogluteal, site unremarkable, patient without complaints. Next depo due June 6 - June 20, reminder card given.

## 2014-07-14 ENCOUNTER — Ambulatory Visit (INDEPENDENT_AMBULATORY_CARE_PROVIDER_SITE_OTHER): Payer: 59 | Admitting: *Deleted

## 2014-07-14 DIAGNOSIS — Z3042 Encounter for surveillance of injectable contraceptive: Secondary | ICD-10-CM

## 2014-07-14 MED ORDER — MEDROXYPROGESTERONE ACETATE 150 MG/ML IM SUSP
150.0000 mg | Freq: Once | INTRAMUSCULAR | Status: AC
  Administered 2014-07-14: 150 mg via INTRAMUSCULAR

## 2014-07-14 NOTE — Progress Notes (Signed)
   Pt in for Depo Provera injection.  Pt tolerated Depo injection. Depo given right upper outer quadrant.  Next injection due August 26-Sept 9, 2016.  Reminder card given. Clovis Pu, RN

## 2014-10-06 ENCOUNTER — Ambulatory Visit (INDEPENDENT_AMBULATORY_CARE_PROVIDER_SITE_OTHER): Payer: 59 | Admitting: Family Medicine

## 2014-10-06 ENCOUNTER — Encounter: Payer: Self-pay | Admitting: Family Medicine

## 2014-10-06 VITALS — BP 139/86 | HR 88 | Temp 98.7°F | Wt 207.0 lb

## 2014-10-06 DIAGNOSIS — Z3042 Encounter for surveillance of injectable contraceptive: Secondary | ICD-10-CM

## 2014-10-06 DIAGNOSIS — L732 Hidradenitis suppurativa: Secondary | ICD-10-CM | POA: Diagnosis not present

## 2014-10-06 DIAGNOSIS — R200 Anesthesia of skin: Secondary | ICD-10-CM

## 2014-10-06 MED ORDER — MEDROXYPROGESTERONE ACETATE 150 MG/ML IM SUSP
150.0000 mg | Freq: Once | INTRAMUSCULAR | Status: AC
  Administered 2014-10-06: 150 mg via INTRAMUSCULAR

## 2014-10-06 NOTE — Patient Instructions (Signed)
Hidradenitis Suppurativa, Sweat Gland Abscess Hidradenitis suppurativa is a long lasting (chronic), uncommon disease of the sweat glands. With this, boil-like lumps and scarring develop in the groin, some times under the arms (axillae), and under the breasts. It may also uncommonly occur behind the ears, in the crease of the buttocks, and around the genitals.  CAUSES  The cause is from a blocking of the sweat glands. They then become infected. It may cause drainage and odor. It is not contagious. So it cannot be given to someone else. It most often shows up in puberty (about 10 to 28 years of age). But it may happen much later. It is similar to acne which is a disease of the sweat glands. This condition is slightly more common in African-Americans and women. SYMPTOMS   Hidradenitis usually starts as one or more red, tender, swellings in the groin or under the arms (axilla).  Over a period of hours to days the lesions get larger. They often open to the skin surface, draining clear to yellow-colored fluid.  The infected area heals with scarring. DIAGNOSIS  Your caregiver makes this diagnosis by looking at you. Sometimes cultures (growing germs on plates in the lab) may be taken. This is to see what germ (bacterium) is causing the infection.  TREATMENT   Topical germ killing medicine applied to the skin (antibiotics) are the treatment of choice. Antibiotics taken by mouth (systemic) are sometimes needed when the condition is getting worse or is severe.  Avoid tight-fitting clothing which traps moisture in.  Dirt does not cause hidradenitis and it is not caused by poor hygiene.  Involved areas should be cleaned daily using an antibacterial soap. Some patients find that the liquid form of Lever 2000, applied to the involved areas as a lotion after bathing, can help reduce the odor related to this condition.  Sometimes surgery is needed to drain infected areas or remove scarred tissue. Removal of  large amounts of tissue is used only in severe cases.  Birth control pills may be helpful.  Oral retinoids (vitamin A derivatives) for 6 to 12 months which are effective for acne may also help this condition.  Weight loss will improve but not cure hidradenitis. It is made worse by being overweight. But the condition is not caused by being overweight.  This condition is more common in people who have had acne.  It may become worse under stress. There is no medical cure for hidradenitis. It can be controlled, but not cured. The condition usually continues for years with periods of getting worse and getting better (remission). Document Released: 09/04/2003 Document Revised: 04/14/2011 Document Reviewed: 04/22/2013 ExitCare Patient Information 2015 [employer]. This information is not intended to replace advice given to you by your health care provider. Make sure you discuss any questions you have with your health care provider.  

## 2014-10-11 DIAGNOSIS — Z309 Encounter for contraceptive management, unspecified: Secondary | ICD-10-CM | POA: Insufficient documentation

## 2014-10-11 NOTE — Progress Notes (Signed)
   Subjective:   Emily Hicks is a 29 y.o. female with a history of hidradenitis here for boil, f/u abnormal left sided sensation and depo  Patient reports that she has had a recurrent boil in the same location in her left axilla for about a year. It drains sometimes and has not closed for the past few month as her other boils generally do. She noted a small amount of foul smelling discharge in the past week and it is somewhat tender. She has not been using deodorant and trying to keep the hair out of it (shaving around but not over the actual boil location).   Review of Systems:  Per HPI. All other systems reviewed and are negative.   PMH, PSH, Medications, Allergies, and FmHx reviewed and updated in EMR.  Social History: never smoker  Objective:  BP 139/86 mmHg  Pulse 88  Temp(Src) 98.7 F (37.1 C) (Oral)  Wt 207 lb (93.895 kg)  Gen:  29 y.o. female in NAD HEENT: NCAT, MMM, EOMI, PERRL, anicteric sclerae CV: RRR, no MRG, no JVD Resp: Non-labored, CTAB, no wheezes noted Abd: Soft, NTND, BS present, no guarding or organomegaly Ext: WWP, no edema, left axillary abscess, ~1cm, small amount of drainage on exam, ~54mm opening that appears chronic with some protruding pink tissue. MSK: Full ROM, strength intact Neuro: Alert and oriented, speech normal, normal strength and light touch sensation throughout      Chemistry      Component Value Date/Time   NA 143 12/22/2013 1000   K 4.3 12/22/2013 1000   CL 108 12/22/2013 1000   CO2 25 12/22/2013 1000   BUN 8 12/22/2013 1000   CREATININE 0.71 12/22/2013 1000   CREATININE 0.71 10/29/2010 0934   CREATININE 0.55 05/03/2010 1820      Component Value Date/Time   CALCIUM 9.8 12/22/2013 1000   ALKPHOS 93 12/22/2013 1000   AST 12 12/22/2013 1000   ALT 10 12/22/2013 1000   BILITOT 0.5 12/22/2013 1000      Lab Results  Component Value Date   WBC 4.6 12/22/2013   HGB 11.7* 12/22/2013   HCT 35.8* 12/22/2013   MCV 82.5 12/22/2013   PLT 289 12/22/2013   Lab Results  Component Value Date   TSH 0.376 04/24/2011   Lab Results  Component Value Date   HGBA1C 5.4 06/07/2012   Assessment & Plan:     Emily Hicks is a 29 y.o. female here for axillary abscess  Hidradenitis suppurativa of left axilla Persistently draining lesion in left axilla, not painful/tender, protruding scar tissue on exam - refer to general surgery - no need for antibiotics at this point, currently draining without significant inflammation  Left sided numbness Abnormal sensation on left side of body >4 years, disappeared during pregnancy but now constant, work-up by neuro started but paused for pregnancy and she never went back, reports MS ruled out - refer back to neuro for further evaluation  Contraceptive management On depo x4 years, happy with this but worried about long-term complications - reassured of safety - discussed alternatives (LARC) - wishes to continue depo at this time - shot given today, will return for RN visit in 3 months    Beverely Low, MD, MPH [facility] PGY-3 10/11/2014 2:07 PM

## 2014-10-11 NOTE — Assessment & Plan Note (Signed)
Abnormal sensation on left side of body >4 years, disappeared during pregnancy but now constant, work-up by neuro started but paused for pregnancy and she never went back, reports MS ruled out - refer back to neuro for further evaluation

## 2014-10-11 NOTE — Assessment & Plan Note (Signed)
On depo x4 years, happy with this but worried about long-term complications - reassured of safety - discussed alternatives (LARC) - wishes to continue depo at this time - shot given today, will return for RN visit in 3 months

## 2014-10-11 NOTE — Assessment & Plan Note (Signed)
Persistently draining lesion in left axilla, not painful/tender, protruding scar tissue on exam - refer to general surgery - no need for antibiotics at this point, currently draining without significant inflammation

## 2014-10-24 ENCOUNTER — Encounter: Payer: Self-pay | Admitting: Neurology

## 2014-10-24 ENCOUNTER — Ambulatory Visit (INDEPENDENT_AMBULATORY_CARE_PROVIDER_SITE_OTHER): Payer: 59 | Admitting: Neurology

## 2014-10-24 VITALS — BP 128/82 | HR 62 | Ht 61.0 in | Wt 208.0 lb

## 2014-10-24 DIAGNOSIS — R938 Abnormal findings on diagnostic imaging of other specified body structures: Secondary | ICD-10-CM | POA: Diagnosis not present

## 2014-10-24 DIAGNOSIS — R9389 Abnormal findings on diagnostic imaging of other specified body structures: Secondary | ICD-10-CM

## 2014-10-24 DIAGNOSIS — R202 Paresthesia of skin: Secondary | ICD-10-CM | POA: Diagnosis not present

## 2014-10-24 NOTE — Progress Notes (Signed)
PATIENT: Emily Hicks DOB: 14-Apr-1985  Chief Complaint  Patient presents with  . Numbness    She has been having progressive numbness and tingling on the left side of her body for the last four years.  Her symptoms are most pronounced in her thigh area.  . Headache    She is has been having some type of headache daily for the last month.       HISTORICAL  Emily Hicks is 29 years old right-handed female, seen in refer by her primary care physician Dr. Jeris Penta Adamo for evaluation of left-sided numbness  She noticed subacute onset of left anterior thigh numbness in 2011, later numbness spreading to involving her whole left side, includes left arm, leg, trunk, left face, initially, she also noticed intermittent left hand and left foot posturing, spasm, lasting 10-15 minutes, without associated loss of consciousness, mild clumsiness on her left side, she noticed that when she dances zumba.  Shortly after the initial symptoms, she was pregnant, we have reviewed MRI of the brain with contrast in 2011, there was few scattered periventricular oval-shaped lesions, does raise the possibility of multiple sclerosis versus small vessel disease.  During pregnancy her symptoms has much improved, she no longer has intermittent left hand feet posturing, but continue have persistent mild left-sided paresthesia, in recent few months, she seems to know is more dense numbness at her left anterior thigh and, and the left side of her body. She denies visual loss,  She also reported a history of headaches in her teens, triggered by stress, sleep deprivation, bilateral frontal pressure headaches, with light noise sensitivity sometimes.  REVIEW OF SYSTEMS: Full 14 system review of systems performed and notable only for headaches, weakness  ALLERGIES: No Known Allergies  HOME MEDICATIONS: Current Outpatient Prescriptions  Medication Sig Dispense Refill  . clindamycin (CLINDAGEL) 1 % gel Apply topically 2  (two) times daily. 30 g 0  . ibuprofen (ADVIL,MOTRIN) 200 MG tablet Take 600 mg by mouth every 6 (six) hours as needed for mild pain or moderate pain.    . medroxyPROGESTERone (DEPO-PROVERA) 150 MG/ML injection Inject 1 mL (150 mg total) into the muscle every 3 (three) months. 1 mL 11     PAST MEDICAL HISTORY: Past Medical History  Diagnosis Date  . Gallstones   . GERD (gastroesophageal reflux disease)     only prn OTC occasionally  . Numbness and tingling of left leg   . Headache     PAST SURGICAL HISTORY: Past Surgical History  Procedure Laterality Date  . Cholecystectomy N/A 12/28/2013    Procedure: LAPAROSCOPIC CHOLECYSTECTOMY;  Surgeon: Claud Kelp, MD;  Location: [facility];  Service: General;  Laterality: N/A;    FAMILY HISTORY: Family History  Problem Relation Age of Onset  . Hypertension Sister   . Diabetes Maternal Grandmother   . Diabetes Maternal Grandfather   . Hypertension Mother   . Hypertension Father     SOCIAL HISTORY:  Social History   Social History  . Marital Status: Single    Spouse Name: N/A  . Number of Children: 1  . Years of Education: 14   Occupational History  . Accounting    Social History Main Topics  . Smoking status: Never Smoker   . Smokeless tobacco: Not on file  . Alcohol Use: No  . Drug Use: No  . Sexual Activity: Not on file   Other Topics Concern  . Not on file   Social History Narrative  Lives at home with fiancee and son.   Right-handed.   1-2 can sodas per day.     PHYSICAL EXAM   Filed Vitals:   10/24/14 1511  BP: 128/82  Pulse: 62  Height:  (1.549 m)  Weight: 208 lb (94.348 kg)    Not recorded      Body mass index is 39.32 kg/(m^2).  PHYSICAL EXAMNIATION:  Gen: NAD, conversant, well nourised, obese, well groomed                     Cardiovascular: Regular rate rhythm, no peripheral edema, warm, nontender. Eyes: Conjunctivae clear without exudates or hemorrhage Neck: Supple, no carotid  bruise. Pulmonary: Clear to auscultation bilaterally   NEUROLOGICAL EXAM:  MENTAL STATUS: Speech:    Speech is normal; fluent and spontaneous with normal comprehension.  Cognition:     Orientation to time, place and person     Normal recent and remote memory     Normal Attention span and concentration     Normal Language, naming, repeating,spontaneous speech     Fund of knowledge   CRANIAL NERVES: CN II: Visual fields are full to confrontation. Fundoscopic exam is normal with sharp discs and no vascular changes. Pupils are round equal and briskly reactive to light. CN III, IV, VI: extraocular movement are normal. No ptosis. CN V: Facial sensation is intact to pinprick in all 3 divisions bilaterally. Corneal responses are intact.  CN VII: Face is symmetric with normal eye closure and smile. CN VIII: Hearing is normal to rubbing fingers CN IX, X: Palate elevates symmetrically. Phonation is normal. CN XI: Head turning and shoulder shrug are intact CN XII: Tongue is midline with normal movements and no atrophy.  MOTOR: There is no pronator drift of out-stretched arms. Muscle bulk and tone are normal. Muscle strength is normal.  REFLEXES: Reflexes are 2+ and symmetric at the biceps, triceps, knees, and ankles. Plantar responses are flexor.  SENSORY: She complains of decreased light touch and pinprick at left side of her body, including her left face, arm, leg, trunk  COORDINATION: Rapid alternating movements and fine finger movements are intact. There is no dysmetria on finger-to-nose and heel-knee-shin.    GAIT/STANCE: Posture is normal. Gait is steady with normal steps, base, arm swing, and turning. Heel and toe walking are normal. Tandem gait is normal.  Romberg is absent.   DIAGNOSTIC DATA (LABS, IMAGING, TESTING) - I reviewed patient records, labs, notes, testing and imaging myself where available.   ASSESSMENT AND PLAN  Emily Hicks is a 29 y.o. female     Left-sided paresthesia abnormal MRI of the brain the past,  Differentiation diagnosis includes multiple sclerosis  Repeat MRI of the brain with and without contrast  Laboratory evaluations   Levert Feinstein, M.D. Ph.D.  [facility] [address] [place], [place] [address] Ph: [phone] Fax: [phone]  CC: Dr. Richarda Blade, Jeris Penta

## 2014-10-26 ENCOUNTER — Telehealth: Payer: Self-pay | Admitting: Neurology

## 2014-10-26 ENCOUNTER — Encounter: Payer: Self-pay | Admitting: *Deleted

## 2014-10-26 LAB — COMPREHENSIVE METABOLIC PANEL
ALK PHOS: 97 IU/L (ref 39–117)
ALT: 9 IU/L (ref 0–32)
AST: 12 IU/L (ref 0–40)
Albumin/Globulin Ratio: 1.5 (ref 1.1–2.5)
Albumin: 4.2 g/dL (ref 3.5–5.5)
BUN/Creatinine Ratio: 11 (ref 8–20)
BUN: 8 mg/dL (ref 6–20)
Bilirubin Total: 0.5 mg/dL (ref 0.0–1.2)
CO2: 23 mmol/L (ref 18–29)
Calcium: 9.5 mg/dL (ref 8.7–10.2)
Chloride: 104 mmol/L (ref 97–108)
Creatinine, Ser: 0.7 mg/dL (ref 0.57–1.00)
GFR calc Af Amer: 136 mL/min/{1.73_m2} (ref >59)
GFR, EST NON AFRICAN AMERICAN: 118 mL/min/{1.73_m2} (ref >59)
GLOBULIN, TOTAL: 2.8 g/dL (ref 1.5–4.5)
Glucose: 86 mg/dL (ref 65–99)
Potassium: 4 mmol/L (ref 3.5–5.2)
SODIUM: 142 mmol/L (ref 134–144)
Total Protein: 7 g/dL (ref 6.0–8.5)

## 2014-10-26 LAB — ANA W/REFLEX IF POSITIVE
Anti Nuclear Antibody(ANA): POSITIVE — AB
CHROMATIN AB SERPL-ACNC: 0.4 AI (ref 0.0–0.9)
Centromere Ab Screen: <0.2 AI (ref 0.0–0.9)
ENA RNP Ab: 0.2 AI (ref 0.0–0.9)
ENA SM Ab Ser-aCnc: <0.2 AI (ref 0.0–0.9)
ENA SSA (RO) AB: 2 AI — AB (ref 0.0–0.9)
ENA SSB (LA) Ab: <0.2 AI (ref 0.0–0.9)
dsDNA Ab: <1 IU/mL (ref 0–9)

## 2014-10-26 LAB — PROTEIN ELECTROPHORESIS, SERUM
A/G RATIO SPE: 1.1 (ref 0.7–1.7)
ALBUMIN ELP: 3.7 g/dL (ref 2.9–4.4)
Alpha 1: 0.2 g/dL (ref 0.0–0.4)
Alpha 2: 0.7 g/dL (ref 0.4–1.0)
Beta: 1.2 g/dL (ref 0.7–1.3)
GLOBULIN, TOTAL: 3.3 g/dL (ref 2.2–3.9)
Gamma Globulin: 1.3 g/dL (ref 0.4–1.8)

## 2014-10-26 LAB — B. BURGDORFI ANTIBODIES

## 2014-10-26 LAB — CBC
HEMOGLOBIN: 11.5 g/dL (ref 11.1–15.9)
Hematocrit: 35.6 % (ref 34.0–46.6)
MCH: 26 pg — ABNORMAL LOW (ref 26.6–33.0)
MCHC: 32.3 g/dL (ref 31.5–35.7)
MCV: 81 fL (ref 79–97)
PLATELETS: 279 10*3/uL (ref 150–379)
RBC: 4.42 x10E6/uL (ref 3.77–5.28)
RDW: 13.6 % (ref 12.3–15.4)
WBC: 5.7 10*3/uL (ref 3.4–10.8)

## 2014-10-26 LAB — HIV ANTIBODY (ROUTINE TESTING W REFLEX): HIV SCREEN 4TH GENERATION: NONREACTIVE

## 2014-10-26 LAB — RPR: RPR: NONREACTIVE

## 2014-10-26 LAB — SEDIMENTATION RATE: Sed Rate: 8 mm/hr (ref 0–32)

## 2014-10-26 LAB — TSH: TSH: 0.581 u[IU]/mL (ref 0.450–4.500)

## 2014-10-26 LAB — C-REACTIVE PROTEIN: CRP: 3.8 mg/L (ref 0.0–4.9)

## 2014-10-26 LAB — VITAMIN B12: Vitamin B-12: 268 pg/mL (ref 211–946)

## 2014-10-26 NOTE — Telephone Encounter (Signed)
Please call patient, laboratory evaluation showed positive ANA, positive SSA, it is often associated with Sjogren's disease, patient presented with dry eyes and dry mouth, check to see if she has any above symptoms, may consider repeat laboratory evaluation at next follow-up visit  Low normal B12 268, she should take over-the-counter vitamin B12 supplement 1000 g daily  Rest of the laboratory evaluation is normal

## 2014-10-26 NOTE — Telephone Encounter (Signed)
Aware of labs. She has only noticed slight dry eyes - no problems with dry mouth.  She will start the B12 supplement.  She will keep her MRI and follow up appts.

## 2014-11-15 ENCOUNTER — Ambulatory Visit (INDEPENDENT_AMBULATORY_CARE_PROVIDER_SITE_OTHER): Payer: 59

## 2014-11-15 DIAGNOSIS — R9389 Abnormal findings on diagnostic imaging of other specified body structures: Secondary | ICD-10-CM

## 2014-11-15 DIAGNOSIS — R202 Paresthesia of skin: Secondary | ICD-10-CM

## 2014-11-15 DIAGNOSIS — R938 Abnormal findings on diagnostic imaging of other specified body structures: Secondary | ICD-10-CM | POA: Diagnosis not present

## 2014-11-16 MED ORDER — GADOPENTETATE DIMEGLUMINE 469.01 MG/ML IV SOLN: 20.0000 mL | Freq: Once | INTRAVENOUS | Status: DC | PRN

## 2014-11-17 ENCOUNTER — Telehealth: Payer: Self-pay | Admitting: Neurology

## 2014-11-17 NOTE — Telephone Encounter (Signed)
Please call patient, MRI of the brain showed 6 supratentorium lesions, compared to previous scan in 2011, there was some progression.  I will go over imaging findings with Emily Hicks in detail at Emily Hicks next follow-up visit in November 23 2014  Abnormal MRI brain (with and without) demonstrating: 1. At least 6 small round and ovoid periventricular and subcortical non-specific T2 hyperintensities noted. No abnormal lesions are seen on post contrast views. These findings are non-specific and considerations include autoimmune, inflammatory, post-infectious, microvascular ischemic or migraine associated etiologies.  2. Compared to MRI on 08/10/09, there is 1 new lesion in the left posterior temporal region (series 3 image 10; series 7 image 14) and 2 new punctate lesions in the left frontal lobe (series 3 image 15). Other lesions appear unchanged.

## 2014-11-20 NOTE — Telephone Encounter (Signed)
Pt returned call

## 2014-11-20 NOTE — Telephone Encounter (Signed)
Left message for a return call

## 2014-11-20 NOTE — Telephone Encounter (Signed)
Spoke to [place] - she is aware of her results and will keep her follow up to further discuss.

## 2014-11-23 ENCOUNTER — Ambulatory Visit (INDEPENDENT_AMBULATORY_CARE_PROVIDER_SITE_OTHER): Payer: 59 | Admitting: Neurology

## 2014-11-23 ENCOUNTER — Encounter: Payer: Self-pay | Admitting: Neurology

## 2014-11-23 VITALS — BP 120/77 | HR 85 | Ht 61.0 in | Wt 208.0 lb

## 2014-11-23 DIAGNOSIS — G43709 Chronic migraine without aura, not intractable, without status migrainosus: Secondary | ICD-10-CM | POA: Diagnosis not present

## 2014-11-23 DIAGNOSIS — R202 Paresthesia of skin: Secondary | ICD-10-CM

## 2014-11-23 DIAGNOSIS — R9089 Other abnormal findings on diagnostic imaging of central nervous system: Secondary | ICD-10-CM

## 2014-11-23 DIAGNOSIS — IMO0002 Reserved for concepts with insufficient information to code with codable children: Secondary | ICD-10-CM

## 2014-11-23 DIAGNOSIS — R930 Abnormal findings on diagnostic imaging of skull and head, not elsewhere classified: Secondary | ICD-10-CM

## 2014-11-23 MED ORDER — NORTRIPTYLINE HCL 10 MG PO CAPS: ORAL_CAPSULE | ORAL | Status: DC

## 2014-11-23 NOTE — Progress Notes (Signed)
Chief Complaint  Patient presents with  . Numbness    Her symptoms are unchanged. She has started her B12 supplement. She would like to discuss her labs and MRI results.      PATIENT: Emily Hicks DOB: [birth year]  Chief Complaint  Patient presents with  . Numbness    Her symptoms are unchanged. She has started her B12 supplement. She would like to discuss her labs and MRI results.     HISTORICAL  Emily Hicks is 29 years old right-handed female, seen in refer by her primary care physician Dr. Jeris Penta Adamo for evaluation of left-sided numbness  She noticed subacute onset of left anterior thigh numbness in 2011, later numbness spreading to involving her whole left side, includes left arm, leg, trunk, left face, initially, she also noticed intermittent left hand and left foot posturing, spasm, lasting 10-15 minutes, without associated loss of consciousness, mild clumsiness on her left side, she noticed that when she dances zumba.  Shortly after the initial symptoms, she was pregnant, we have reviewed MRI of the brain with contrast in 2011, there was few scattered periventricular oval-shaped lesions, does raise the possibility of multiple sclerosis versus small vessel disease.  During pregnancy her symptoms has much improved, she no longer has intermittent left hand feet posturing, but continue have persistent mild left-sided paresthesia, in recent few months, she seems to know is more dense numbness at her left anterior thigh and, and the left side of her body. She denies visual loss,  She also reported a history of headaches in her teens, triggered by stress, sleep deprivation, bilateral frontal pressure headaches, with light noise sensitivity sometimes.  UPDATE Nov 23 2014: She still has left side numbness, from left face to her left feet, she felt of numbness, hypersensitivity sometimes, she denies gait difficulty, no bowel and bladder incontinence, no visual loss.  She continue has  occasionally headaches, 1-2 times each week, mild, usually responded to Tylenol,  We have personally reviewed MRI of the brain in September 2016, and compared to previous scan in 2011, At least 6 small round and ovoid periventricular and subcortical non-specific T2 hyperintensities noted. No abnormal lesions are seen on post contrast views. These findings are non-specific and considerations include autoimmune, inflammatory, post-infectious, microvascular ischemic or migraine associated etiologies. Compared to MRI on 08/10/09, there is 1 new lesion in the left posterior temporal region and 2 new punctate lesions in the left frontal lobe. Other lesions appear unchanged.   Reviewed laboratory evaluation, positive ANA, SSA, normal TSH, Lyme titer, CBC, CMP, RPR, HIV, protein electrophoresis.  REVIEW OF SYSTEMS: Full 14 system review of systems performed and notable only for headaches, weakness  ALLERGIES: No Known Allergies  HOME MEDICATIONS: Current Outpatient Prescriptions  Medication Sig Dispense Refill  . clindamycin (CLINDAGEL) 1 % gel Apply topically 2 (two) times daily. 30 g 0  . ibuprofen (ADVIL,MOTRIN) 200 MG tablet Take 600 mg by mouth every 6 (six) hours as needed for mild pain or moderate pain.    . medroxyPROGESTERone (DEPO-PROVERA) 150 MG/ML injection Inject 1 mL (150 mg total) into the muscle every 3 (three) months. 1 mL 11     PAST MEDICAL HISTORY: Past Medical History  Diagnosis Date  . Gallstones   . GERD (gastroesophageal reflux disease)     only prn OTC occasionally  . Numbness and tingling of left leg   . Headache     PAST SURGICAL HISTORY: Past Surgical History  Procedure Laterality Date  .  Cholecystectomy N/A 12/28/2013    Procedure: LAPAROSCOPIC CHOLECYSTECTOMY;  Surgeon: Claud Kelp, MD;  Location: [facility];  Service: General;  Laterality: N/A;    FAMILY HISTORY: Family History  Problem Relation Age of Onset  . Hypertension Sister   . Diabetes Maternal  Grandmother   . Diabetes Maternal Grandfather   . Hypertension Mother   . Hypertension Father     SOCIAL HISTORY:  Social History   Social History  . Marital Status: Single    Spouse Name: N/A  . Number of Children: 1  . Years of Education: 14   Occupational History  . Accounting    Social History Main Topics  . Smoking status: Never Smoker   . Smokeless tobacco: Not on file  . Alcohol Use: No  . Drug Use: No  . Sexual Activity: Not on file   Other Topics Concern  . Not on file   Social History Narrative   Lives at home with fiancee and son.   Right-handed.   1-2 can sodas per day.     PHYSICAL EXAM   Filed Vitals:   11/23/14 0925  BP: 120/77  Pulse: 85  Height:  (1.549 m)  Weight: 208 lb (94.348 kg)    Not recorded      Body mass index is 39.32 kg/(m^2).  PHYSICAL EXAMNIATION:  Gen: NAD, conversant, well nourised, obese, well groomed                     Cardiovascular: Regular rate rhythm, no peripheral edema, warm, nontender. Eyes: Conjunctivae clear without exudates or hemorrhage Neck: Supple, no carotid bruise. Pulmonary: Clear to auscultation bilaterally   NEUROLOGICAL EXAM:  MENTAL STATUS: Speech:    Speech is normal; fluent and spontaneous with normal comprehension.  Cognition:     Orientation to time, place and person     Normal recent and remote memory     Normal Attention span and concentration     Normal Language, naming, repeating,spontaneous speech     Fund of knowledge   CRANIAL NERVES: CN II: Visual fields are full to confrontation. Fundoscopic exam is normal with sharp discs and no vascular changes. Pupils are round equal and briskly reactive to light. CN III, IV, VI: extraocular movement are normal. No ptosis. CN V: Facial sensation is intact to pinprick in all 3 divisions bilaterally. Corneal responses are intact.  CN VII: Face is symmetric with normal eye closure and smile. CN VIII: Hearing is normal to rubbing  fingers CN IX, X: Palate elevates symmetrically. Phonation is normal. CN XI: Head turning and shoulder shrug are intact CN XII: Tongue is midline with normal movements and no atrophy.  MOTOR: There is no pronator drift of out-stretched arms. Muscle bulk and tone are normal. Muscle strength is normal.  REFLEXES: Reflexes are 2+ and symmetric at the biceps, triceps, knees, and ankles. Plantar responses are flexor.  SENSORY: She complains of decreased light touch and pinprick at left side of her body, including her left face, arm, leg, trunk  COORDINATION: Rapid alternating movements and fine finger movements are intact. There is no dysmetria on finger-to-nose and heel-knee-shin.    GAIT/STANCE: Posture is normal. Gait is steady with normal steps, base, arm swing, and turning. Heel and toe walking are normal. Tandem gait is normal.  Romberg is absent.   DIAGNOSTIC DATA (LABS, IMAGING, TESTING) - I reviewed patient records, labs, notes, testing and imaging myself where available.   ASSESSMENT AND PLAN  Emily Plotner  Hicks is a 29 y.o. female    abnormal MRI of the brain the past,  Repeat MRI of brain showed mild progression of the periventricular white matter gliosis, most likely small vessel disease,  left-sided paresthesia  After extensive discussion with patient, she decided to hold off further evaluation such as MRI of cervical spine, potential lumbar puncture at this point,  Continue observe her symptoms, keep follow-up appointment in one year  Migraine headaches  Nortriptyline 10 mg, titrating to 20 mg every night as preventative medications  Tylenol, or Excedrin Migraine as needed Positive ANA, SSA antibody  She complains of mild dry eye, I have forwarded to lab to her primary care, may consider repeat lab at her next yearly checkup  Levert Feinstein, M.D. Ph.D.  [facility] [address], [address] [place], [place] [address] Ph: [phone] Fax:  [phone]  CC: Dr. Richarda Blade, Jeris Penta

## 2015-01-01 ENCOUNTER — Ambulatory Visit (INDEPENDENT_AMBULATORY_CARE_PROVIDER_SITE_OTHER): Payer: 59 | Admitting: *Deleted

## 2015-01-01 DIAGNOSIS — Z3042 Encounter for surveillance of injectable contraceptive: Secondary | ICD-10-CM

## 2015-01-01 MED ORDER — MEDROXYPROGESTERONE ACETATE 150 MG/ML IM SUSP
150.0000 mg | Freq: Once | INTRAMUSCULAR | Status: AC
  Administered 2015-01-01: 150 mg via INTRAMUSCULAR

## 2015-01-01 NOTE — Progress Notes (Signed)
   Pt in for Depo Provera injection.  Pt tolerated Depo injection. Depo given left upper outer quadrant.  Next injection due Feb. 13-feb. 27, 2017.  Reminder card given. Clovis Pu, RN

## 2015-03-30 ENCOUNTER — Ambulatory Visit (INDEPENDENT_AMBULATORY_CARE_PROVIDER_SITE_OTHER): Payer: 59 | Admitting: *Deleted

## 2015-03-30 DIAGNOSIS — Z3042 Encounter for surveillance of injectable contraceptive: Secondary | ICD-10-CM

## 2015-03-30 MED ORDER — MEDROXYPROGESTERONE ACETATE 150 MG/ML IM SUSP
150.0000 mg | Freq: Once | INTRAMUSCULAR | Status: AC
  Administered 2015-03-30: 150 mg via INTRAMUSCULAR

## 2015-03-30 NOTE — Progress Notes (Signed)
Patient in today for depo provera. Injection given in in left upper outer quadrant, patient without complaints, site unremarkable. Next depo due May 12 - May 26, reminder card given. 

## 2015-06-28 ENCOUNTER — Ambulatory Visit (INDEPENDENT_AMBULATORY_CARE_PROVIDER_SITE_OTHER): Payer: 59 | Admitting: *Deleted

## 2015-06-28 DIAGNOSIS — Z3042 Encounter for surveillance of injectable contraceptive: Secondary | ICD-10-CM | POA: Diagnosis not present

## 2015-06-28 MED ORDER — MEDROXYPROGESTERONE ACETATE 150 MG/ML IM SUSP
150.0000 mg | Freq: Once | INTRAMUSCULAR | Status: AC
  Administered 2015-06-28: 150 mg via INTRAMUSCULAR

## 2015-06-28 NOTE — Progress Notes (Signed)
   Pt in for Depo Provera injection.  Pt tolerated Depo injection. Depo given right upper outer quadrant.  Next injection due August 10-24, 2017.  Reminder card given. Clovis Pu, RN

## 2015-08-04 ENCOUNTER — Encounter (HOSPITAL_COMMUNITY): Payer: Self-pay

## 2015-08-04 ENCOUNTER — Emergency Department (HOSPITAL_COMMUNITY): Payer: 59

## 2015-08-04 ENCOUNTER — Emergency Department (HOSPITAL_COMMUNITY)
Admission: EM | Admit: 2015-08-04 | Discharge: 2015-08-04 | Disposition: A | Discharge status: Home / Self Care | Payer: 59 | Attending: Emergency Medicine | Admitting: Emergency Medicine

## 2015-08-04 DIAGNOSIS — W098XXA Fall on or from other playground equipment, initial encounter: Secondary | ICD-10-CM | POA: Diagnosis not present

## 2015-08-04 DIAGNOSIS — S8992XA Unspecified injury of left lower leg, initial encounter: Secondary | ICD-10-CM | POA: Diagnosis present

## 2015-08-04 DIAGNOSIS — S8392XA Sprain of unspecified site of left knee, initial encounter: Secondary | ICD-10-CM

## 2015-08-04 DIAGNOSIS — Y9344 Activity, trampolining: Secondary | ICD-10-CM | POA: Insufficient documentation

## 2015-08-04 DIAGNOSIS — Y9289 Other specified places as the place of occurrence of the external cause: Secondary | ICD-10-CM | POA: Insufficient documentation

## 2015-08-04 DIAGNOSIS — Y999 Unspecified external cause status: Secondary | ICD-10-CM | POA: Insufficient documentation

## 2015-08-04 MED ORDER — NAPROXEN 500 MG PO TABS: 500.0000 mg | ORAL_TABLET | Freq: Two times a day (BID) | ORAL | Status: DC

## 2015-08-04 NOTE — Discharge Instructions (Signed)

## 2015-08-04 NOTE — ED Notes (Signed)
Bed: WA04 Expected date:  Expected time:  Means of arrival:  Comments: 47 F knee injury

## 2015-08-04 NOTE — ED Provider Notes (Signed)
CSN: [id number]     Arrival date & time 08/04/15  1922 History  First MD Initiated Contact with Patient 08/04/15 1926     Chief Complaint  Patient presents with  . Knee Injury   HPI Pt was jumping at a trampoline park.  She landed wrong on her left knee and heard a pop.  Pt is feeling pain mostly in the posterior aspect of her knee.  No ankle or hip pain.  She has not been able to walk on it.  Past Medical History  Diagnosis Date  . Gallstones   . GERD (gastroesophageal reflux disease)     only prn OTC occasionally  . Numbness and tingling of left leg   . Headache    Past Surgical History  Procedure Laterality Date  . Cholecystectomy N/A 12/28/2013    Procedure: LAPAROSCOPIC CHOLECYSTECTOMY;  Surgeon: Claud Kelp, MD;  Location: [facility];  Service: General;  Laterality: N/A;   Family History  Problem Relation Age of Onset  . Hypertension Sister   . Diabetes Maternal Grandmother   . Diabetes Maternal Grandfather   . Hypertension Mother   . Hypertension Father    Social History  Substance Use Topics  . Smoking status: Never Smoker   . Smokeless tobacco: None  . Alcohol Use: No   OB History    Gravida Para Term Preterm AB TAB SAB Ectopic Multiple Living       Review of Systems  All other systems reviewed and are negative.     Allergies  Review of patient's allergies indicates no known allergies.  Home Medications   Prior to Admission medications   Medication Sig Start Date End Date Taking? Authorizing Provider  clindamycin (CLINDAGEL) 1 % gel Apply topically 2 (two) times daily. 06/07/13   Nani Ravens, MD  ibuprofen (ADVIL,MOTRIN) 200 MG tablet Take 600 mg by mouth every 6 (six) hours as needed for mild pain or moderate pain.    Historical Provider, MD  medroxyPROGESTERone (DEPO-PROVERA) 150 MG/ML injection Inject 1 mL (150 mg total) into the muscle every 3 (three) months. 02/11/11   Ozella Rocks, MD  nortriptyline (PAMELOR) 10 MG capsule  One po qhs xone week, then 2 tabs po qhs 11/23/14   Levert Feinstein, MD  vitamin B-12 (CYANOCOBALAMIN) 1000 MCG tablet Take 1,000 mcg by mouth daily.    Historical Provider, MD   BP 122/80 mmHg  Pulse 108  Temp(Src) 98.5 F (36.9 C) (Oral)  Resp 16  SpO2 99% Physical Exam  Constitutional: She appears well-developed and well-nourished. No distress.  HENT:  Head: Normocephalic and atraumatic.  Right Ear: External ear normal.  Left Ear: External ear normal.  Eyes: Conjunctivae are normal. Right eye exhibits no discharge. Left eye exhibits no discharge. No scleral icterus.  Neck: Neck supple. No tracheal deviation present.  Cardiovascular: Normal rate.   Pulmonary/Chest: Effort normal. No stridor. No respiratory distress.  Musculoskeletal: She exhibits tenderness. She exhibits no edema.       Left knee: Tenderness found. No medial joint line, no lateral joint line, no MCL, no LCL and no patellar tendon tenderness noted.  Able to hold leg off the bed extended, no patellar or quadricep deficit  Neurological: She is alert. Cranial nerve deficit: no gross deficits.  Skin: Skin is warm and dry. No rash noted.  Psychiatric: She has a normal mood and affect.  Nursing note and vitals reviewed.   ED Course  Procedures  Imaging Review Dg Knee Complete 4 Views Left  08/04/2015  CLINICAL DATA:  Trampoline today and fell and landed on her knee. Anterior left knee pain. EXAM: LEFT KNEE - COMPLETE 4+ VIEW COMPARISON:  None. FINDINGS: No evidence of fracture, dislocation, or joint effusion. No evidence of arthropathy or other focal bone abnormality. Soft tissues are unremarkable. IMPRESSION: Negative. Electronically Signed   By: Kennith Center M.D.   On: 08/04/2015 19:47   I have personally reviewed and evaluated these images and lab results as part of my medical decision-making.    MDM   Final diagnoses:  Knee sprain, left, initial encounter    No fx or dislocation.  Will place in a knee sleeve and  provide crutches.  Follow up with orthopedics.   Linwood Dibbles, MD 08/04/15 [phone]

## 2015-08-04 NOTE — ED Notes (Addendum)
BIB EMS, pt c/o left knee pain 6/10 after falling on a trampoline. Pt states that "I heard a pop when I came down on my left leg wrong" No obvious deformity noted. Swelling observed below patella. Pt A+OX4, speaking in complete sentences.

## 2015-09-27 ENCOUNTER — Ambulatory Visit (INDEPENDENT_AMBULATORY_CARE_PROVIDER_SITE_OTHER): Payer: 59 | Admitting: *Deleted

## 2015-09-27 DIAGNOSIS — Z3042 Encounter for surveillance of injectable contraceptive: Secondary | ICD-10-CM | POA: Diagnosis not present

## 2015-09-27 MED ORDER — MEDROXYPROGESTERONE ACETATE 150 MG/ML IM SUSP
150.0000 mg | Freq: Once | INTRAMUSCULAR | Status: AC
  Administered 2015-09-27: 150 mg via INTRAMUSCULAR

## 2015-09-27 NOTE — Progress Notes (Signed)
   Pt in for Depo Provera injection.  Pt tolerated Depo injection. Depo given left upper outer quadrant.  Next injection due Nov. 9-23, 2017. Patient is due for annual physical advised patient to schedule with next Depo Provera or sooner.   Reminder card given. Clovis Pu, RN

## 2015-11-26 ENCOUNTER — Telehealth: Payer: Self-pay | Admitting: *Deleted

## 2015-11-26 ENCOUNTER — Ambulatory Visit: Payer: 59 | Admitting: Neurology

## 2015-11-26 NOTE — Telephone Encounter (Signed)
No showed follow up appointment. 

## 2015-12-20 ENCOUNTER — Ambulatory Visit (INDEPENDENT_AMBULATORY_CARE_PROVIDER_SITE_OTHER): Payer: 59 | Admitting: Family Medicine

## 2015-12-20 ENCOUNTER — Encounter: Payer: Self-pay | Admitting: Family Medicine

## 2015-12-20 ENCOUNTER — Other Ambulatory Visit (HOSPITAL_COMMUNITY)
Admission: RE | Admit: 2015-12-20 | Discharge: 2015-12-20 | Disposition: A | Discharge status: Home / Self Care | Payer: 59 | Source: Ambulatory Visit | Attending: Family Medicine | Admitting: Family Medicine

## 2015-12-20 VITALS — BP 131/79 | HR 105 | Temp 98.5°F | Ht 61.0 in | Wt 222.2 lb

## 2015-12-20 DIAGNOSIS — IMO0001 Reserved for inherently not codable concepts without codable children: Secondary | ICD-10-CM

## 2015-12-20 DIAGNOSIS — Z0000 Encounter for general adult medical examination without abnormal findings: Secondary | ICD-10-CM

## 2015-12-20 DIAGNOSIS — Z124 Encounter for screening for malignant neoplasm of cervix: Secondary | ICD-10-CM | POA: Diagnosis not present

## 2015-12-20 DIAGNOSIS — Z6841 Body Mass Index (BMI) 40.0 and over, adult: Secondary | ICD-10-CM

## 2015-12-20 DIAGNOSIS — E669 Obesity, unspecified: Secondary | ICD-10-CM | POA: Diagnosis not present

## 2015-12-20 DIAGNOSIS — Z3042 Encounter for surveillance of injectable contraceptive: Secondary | ICD-10-CM | POA: Diagnosis not present

## 2015-12-20 DIAGNOSIS — Z23 Encounter for immunization: Secondary | ICD-10-CM | POA: Diagnosis not present

## 2015-12-20 DIAGNOSIS — Z01419 Encounter for gynecological examination (general) (routine) without abnormal findings: Secondary | ICD-10-CM | POA: Insufficient documentation

## 2015-12-20 LAB — LIPID PANEL
CHOL/HDL RATIO: 2.5 ratio (ref <5.0)
Cholesterol: 79 mg/dL (ref <200)
HDL: 31 mg/dL — AB (ref >50)
LDL Cholesterol: 34 mg/dL (ref <100)
Triglycerides: 71 mg/dL (ref <150)
VLDL: 14 mg/dL (ref <30)

## 2015-12-20 MED ORDER — MEDROXYPROGESTERONE ACETATE 150 MG/ML IM SUSP
150.0000 mg | Freq: Once | INTRAMUSCULAR | Status: AC
  Administered 2015-12-20: 150 mg via INTRAMUSCULAR

## 2015-12-20 NOTE — Patient Instructions (Signed)
Was nice to meet you. We will check her cholesterol today.   - Exercise at least 30-45 minutes a day,  3-4 days a week.  - Eat a low-fat diet with lots of fruits and vegetables, up to 7-9 servings per day. - Seatbelts can save your life. Wear them always. - Smoke detectors on every level of your home, check batteries every year. - Eye Doctor - have an eye exam every 1-2 years - Safe sex - if you may be exposed to STDs, use a condom. - Depression is common in our stressful world.If you're feeling down or losing interest in things you normally enjoy, please come in for a visit. - Violence - If anyone is threatening or hurting you, please call immediately.

## 2015-12-20 NOTE — Progress Notes (Signed)
30 y.o. year old female presents for well woman/preventative visit and annual GYN examination.  Acute Concerns: None  Diet: Varied  Exercise: None recently as she has injured her knee.  Sexual/Birth History: G1 P10001  Birth Control: depo-provera   Social:  Social History   Social History  . Marital status: Single    Spouse name: N/A  . Number of children: 2  . Years of education: 14   Occupational History  . Accounting    Social History Main Topics  . Smoking status: Never Smoker  . Smokeless tobacco: None  . Alcohol use No  . Drug use: No  . Sexual activity: Not Asked   Other Topics Concern  . None   Social History Narrative   Lives at home with fiancee and son.   Right-handed.   1-2 can sodas per day.  She denies alcohol use, tobacco use, or drug use.  She feels safe in her relationship and denies apathy, feeling depressed, down or hopeless.  Immunization: Immunization History  Administered Date(s) Administered  . Tdap 12/20/2015    Cancer Screening:  Pap Smear: 06/07/12- negative for intraepithelial lesion or malignancy (had LSIL in 2011 and 2013)  Mammogram: Never had, no family history of breast cancer  Colonoscopy: No family history of colon cancer.  Family History  Problem Relation Age of Onset  . Hypertension Sister   . Diabetes Maternal Grandmother   . Diabetes Maternal Grandfather   . Hypertension Mother   . Hypertension Father      Physical Exam: VITALS: Reviewed Blood pressure 131/79, pulse (!) 105, temperature 98.5 F (36.9 C), temperature source Oral, height 5\' 1"  (1.549 m), weight 222 lb 3.2 oz (100.8 kg).  GEN: Pleasant female, NAD HEENT: Normocephalic, PERRL, EOMI, no scleral icterus, bilateral TM pearly grey, nasal septum midline, MMM, uvula midline, no anterior or posterior lymphadenopathy, no thyromegaly CARDIAC:RRR, S1 and S2 present, no murmur, no heaves/thrills RESP: CTAB, normal effort ABD: soft, no tenderness, normal bowel  sounds GU/GYN:Exam performed in the presence of a chaperone. External genitalia within normal limits with small firm, non-mobile cyst measuring ~2x53mm on the left labia majora that's slightly tender to palpation.  Vaginal mucosa pink, moist, normal rugae.  Nonfriable cervix without lesions, no discharge or bleeding noted on speculum exam.  Bimanual exam revealed normal, nongravid uterus.  No cervical motion tenderness. No adnexal masses bilaterally.   EXT: No edema, 2+ radial and DP pulses SKIN: no rash noted over exposed skin.  ASSESSMENT & PLAN: 30 y.o. female presents for annual well woman/preventative exam and GYN exam. Please see problem specific assessment and plan.   Healthcare maintenance Patient has a history of L SIL in 2011 and 2013. Her most recent Pap smear, and 2014, was normal. -Repeat Pap smear today with reflex co-testing - Given the patient's BMI and family history of hypertension and high cholesterol, will obtain a lipid panel today. -Patient due for TD Vaccine, will receive today. -Patient declines the annual influenza vaccine. -Discussed importance of regular exercise, at least 150 minutes per week -Discussed importance of a varied diet.  -Patient to follow-up in one year or sooner as needed.  Contraceptive management Will get Depo-Provera today.

## 2015-12-20 NOTE — Assessment & Plan Note (Signed)
Will get Depo-Provera today.

## 2015-12-20 NOTE — Assessment & Plan Note (Signed)
Patient has a history of L SIL in 2011 and 2013. Her most recent Pap smear, and 2014, was normal. -Repeat Pap smear today with reflex co-testing - Given the patient's BMI and family history of hypertension and high cholesterol, will obtain a lipid panel today. -Patient due for TD Vaccine, will receive today. -Patient declines the annual influenza vaccine. -Discussed importance of regular exercise, at least 150 minutes per week -Discussed importance of a varied diet.  -Patient to follow-up in one year or sooner as needed.

## 2015-12-24 ENCOUNTER — Encounter: Payer: Self-pay | Admitting: Family Medicine

## 2015-12-24 ENCOUNTER — Telehealth: Payer: Self-pay | Admitting: Family Medicine

## 2015-12-24 LAB — CYTOLOGY - PAP: Diagnosis: NEGATIVE

## 2015-12-24 MED ORDER — FLUCONAZOLE 150 MG PO TABS: 150.0000 mg | ORAL_TABLET | Freq: Once | ORAL | 0 refills | Status: AC

## 2015-12-24 NOTE — Telephone Encounter (Signed)
Diflucan sent to pharmacy.   Joanna Puff, MD [facility] Resident  12/24/2015, 5:14 PM

## 2015-12-24 NOTE — Addendum Note (Signed)
Addended by: Joanna Puff on: 12/24/2015 05:15 PM   Modules accepted: Orders

## 2015-12-24 NOTE — Telephone Encounter (Signed)
Spoke with pt and she is having those symptoms and would like you to send her in some medicine. Deseree Bruna Potter, CMA

## 2015-12-24 NOTE — Telephone Encounter (Signed)
I attempted to call the patient's cell phone and work phone without answer. Pap smear was normal but revealed a few yeast. This can be normal, however if she's having vaginal irritation, abnormal discharge, or itching please let us know so I can prescribe her a medication.  Could you please relay this information-- a letter was sent just in case.  Thanks, Joanna Puffrystal S. Arlie Posch, MD [facility] Resident  12/24/2015, 12:13 PM

## 2016-03-20 ENCOUNTER — Ambulatory Visit (INDEPENDENT_AMBULATORY_CARE_PROVIDER_SITE_OTHER): Payer: 59 | Admitting: *Deleted

## 2016-03-20 DIAGNOSIS — Z3042 Encounter for surveillance of injectable contraceptive: Secondary | ICD-10-CM | POA: Diagnosis not present

## 2016-03-20 MED ORDER — MEDROXYPROGESTERONE ACETATE 150 MG/ML IM SUSP
150.0000 mg | Freq: Once | INTRAMUSCULAR | Status: AC
  Administered 2016-03-20: 150 mg via INTRAMUSCULAR

## 2016-03-20 NOTE — Progress Notes (Signed)
Patient in today for depo-provera. Injection in left upper outer quadrant, patient without complaints, site unremarkable. Next depo due May 3 - May 17, reminder card given.

## 2016-06-18 ENCOUNTER — Ambulatory Visit (INDEPENDENT_AMBULATORY_CARE_PROVIDER_SITE_OTHER): Payer: 59 | Admitting: *Deleted

## 2016-06-18 DIAGNOSIS — Z3042 Encounter for surveillance of injectable contraceptive: Secondary | ICD-10-CM | POA: Diagnosis not present

## 2016-06-18 MED ORDER — MEDROXYPROGESTERONE ACETATE 150 MG/ML IM SUSP
150.0000 mg | Freq: Once | INTRAMUSCULAR | Status: AC
  Administered 2016-06-18: 150 mg via INTRAMUSCULAR

## 2016-06-18 NOTE — Progress Notes (Signed)
   Pt in for Depo Provera injection.  Pt tolerated Depo injection. Depo given right upper outer quadrant.  Next injection due August 1-15, 2018.  Reminder card given. Clovis Pu, RN

## 2016-09-23 ENCOUNTER — Ambulatory Visit (INDEPENDENT_AMBULATORY_CARE_PROVIDER_SITE_OTHER): Payer: BLUE CROSS/BLUE SHIELD | Admitting: *Deleted

## 2016-09-23 DIAGNOSIS — Z3042 Encounter for surveillance of injectable contraceptive: Secondary | ICD-10-CM | POA: Diagnosis not present

## 2016-09-23 LAB — POCT URINE PREGNANCY: Preg Test, Ur: NEGATIVE

## 2016-09-23 MED ORDER — MEDROXYPROGESTERONE ACETATE 150 MG/ML IM SUSP
150.0000 mg | Freq: Once | INTRAMUSCULAR | Status: AC
  Administered 2016-09-23: 150 mg via INTRAMUSCULAR

## 2016-09-23 NOTE — Progress Notes (Signed)
U

## 2016-09-23 NOTE — Progress Notes (Signed)
   Pt late for Depo Provera injection.  Pregnancy test ordered; result negative. Patient advised to use another reliable form of birth control for the next 2 weeks. Pt tolerated Depo injection. Depo given left upper outer quadrant.  Next injection due November 6-20, 2018. Patient advised to schedule appointment with PCP for annual visit and new Depo Provera order.  Reminder card given. Clovis Pu, RN

## 2016-10-22 ENCOUNTER — Encounter: Payer: Self-pay | Admitting: Internal Medicine

## 2016-10-22 ENCOUNTER — Ambulatory Visit (INDEPENDENT_AMBULATORY_CARE_PROVIDER_SITE_OTHER): Payer: BLUE CROSS/BLUE SHIELD | Admitting: Internal Medicine

## 2016-10-22 VITALS — BP 132/84 | HR 90 | Temp 98.5°F | Wt 211.0 lb

## 2016-10-22 DIAGNOSIS — R5383 Other fatigue: Secondary | ICD-10-CM | POA: Diagnosis not present

## 2016-10-22 DIAGNOSIS — K5900 Constipation, unspecified: Secondary | ICD-10-CM | POA: Diagnosis not present

## 2016-10-22 DIAGNOSIS — Z87898 Personal history of other specified conditions: Secondary | ICD-10-CM

## 2016-10-22 NOTE — Progress Notes (Signed)
[facility] Progress Note  Subjective:  Emily Hicks is a 31 y.o. female with history of obesity who presents for concern about low thyroid levels. Patient says for the past few months she has had increased fatigue, thinning of her hair around her temples, and constipation--can be days between BMs. Notes occasional sensation of heart racing when just sitting at her desk. She has made significant changes to her lifestyle to try to achieve weight loss and feels that things have stalled despite walking 2 miles daily on her lunch break at work and using [employer] at [employer]. She has been doing meal planning to avoid making impulsive healthy choices. Not aware of close family members with autoimmune disease of thyroid issues. ROS: No changes in vision, no chest pain  Social: Does not smoke, use drugs, or drink EtOH  No Known Allergies  Objective: Blood pressure 132/84, pulse 90, temperature 98.5 F (36.9 C), temperature source Oral, weight 211 lb (95.7 kg). Body mass index is 39.87 kg/m. Constitutional: Obese female, very pleasant HENT: MMM Neck: Small, symmetric, nontender thyroid gland Cardiovascular: RRR, S1, S2, no m/r/g.  Pulmonary/Chest: Effort normal and breath sounds normal. No respiratory distress.  Abdominal: Soft. +BS, NT, ND Musculoskeletal: No LE edema Neurological: AOx3, no focal deficits. Skin: Skin is warm and dry. No rash noted.   Psychiatric: Normal mood and affect.  Vitals reviewed  Had normal TSH in 2016 but elevated ANA and SSA Ro Ab.   Assessment/Plan: Other fatigue - Patient concerned low thyroid could be contributing to stalled weight loss and having some associated symptoms of hair loss and constipation. Denies trouble with mood. Hgb 11.5 in 2016 and patient not menstruating on depo, so low suspicion for anemia.  - Will obtain TSH, vitamin D levels.  - Will repeat ANA level, SSA Ro Ab, as suggested by Neurologist at last OV in 2016 as  Sjogren syndrome can be associated with constipation (though patient no longer has dry eye; no lymphadenopathy but does report occasional trouble swallowing). Could consider rheumatology referral if still positive.   Obesity - Recommended trial of counting calories to see if she is eating appropriate portions for weight loss. Recommended [email or web address].  Follow-up prn.  Dani Gobble, MD [facility], PGY-3

## 2016-10-22 NOTE — Patient Instructions (Signed)
Ms. Zill,  We are checking TSH (thyroid stimulating hormone), vitamin D level, and autoimmune labs ANA and SSA (could be associated with gastrointestinal slowing).   I recommend tracking calories for a couple days on [email or web address]. This site can guide you through how many calories you need to maintain or lose weight by activity level.  I will call you with lab results.  Congratulations on making so many healthy changes. The exercise and health-conscious eating is great for your cardiovascular health independent of weight loss.  Best, Dr. Sampson Goon

## 2016-10-23 DIAGNOSIS — R5383 Other fatigue: Secondary | ICD-10-CM | POA: Insufficient documentation

## 2016-10-23 LAB — SJOGREN'S SYNDROME ANTIBODS(SSA + SSB): ENA SSA (RO) AB: 3.2 AI — AB (ref 0.0–0.9)

## 2016-10-23 LAB — VITAMIN D 25 HYDROXY (VIT D DEFICIENCY, FRACTURES): Vit D, 25-Hydroxy: 15.7 ng/mL — ABNORMAL LOW (ref 30.0–100.0)

## 2016-10-23 LAB — TSH: TSH: 0.397 u[IU]/mL — AB (ref 0.450–4.500)

## 2016-10-23 LAB — ANA: ANA: POSITIVE — AB

## 2016-10-23 NOTE — Assessment & Plan Note (Addendum)
-   Patient concerned low thyroid could be contributing to stalled weight loss and having some associated symptoms of hair loss and constipation. Denies trouble with mood. Hgb 11.5 in 2016 and patient not menstruating on depo, so low suspicion for anemia.  - Will obtain TSH, vitamin D levels.  - Will repeat ANA level, SSA Ro Ab, as suggested by Neurologist at last OV in 2016 as Sjogren syndrome can be associated with constipation (though patient no longer has dry eye; no lymphadenopathy but does report occasional trouble swallowing). Could consider rheumatology referral if still positive.

## 2016-10-23 NOTE — Assessment & Plan Note (Signed)
-   Recommended trial of counting calories to see if she is eating appropriate portions for weight loss. Recommended [email or web address].

## 2016-10-27 ENCOUNTER — Other Ambulatory Visit: Payer: Self-pay | Admitting: Internal Medicine

## 2016-10-27 ENCOUNTER — Encounter: Payer: Self-pay | Admitting: Internal Medicine

## 2016-10-27 MED ORDER — VITAMIN D (ERGOCALCIFEROL) 1.25 MG (50000 UNIT) PO CAPS
50000.0000 [IU] | ORAL_CAPSULE | ORAL | 0 refills | Status: DC
Start: 2016-10-27 — End: 2017-01-07

## 2016-12-23 ENCOUNTER — Ambulatory Visit (INDEPENDENT_AMBULATORY_CARE_PROVIDER_SITE_OTHER): Payer: BLUE CROSS/BLUE SHIELD | Admitting: *Deleted

## 2016-12-23 DIAGNOSIS — Z3042 Encounter for surveillance of injectable contraceptive: Secondary | ICD-10-CM

## 2016-12-23 MED ORDER — MEDROXYPROGESTERONE ACETATE 150 MG/ML IM SUSY
150.0000 mg | PREFILLED_SYRINGE | Freq: Once | INTRAMUSCULAR | Status: AC
  Administered 2016-12-23: 150 mg via INTRAMUSCULAR

## 2016-12-23 NOTE — Progress Notes (Signed)
Patient here today for Depo Provera injection.  Depo given today in right upper outer quadrant.  Site unremarkable & patient tolerated injection.  Next injection due February 5 - February 19.  Reminder card given.

## 2017-01-07 ENCOUNTER — Encounter: Payer: Self-pay | Admitting: Internal Medicine

## 2017-01-07 ENCOUNTER — Ambulatory Visit (INDEPENDENT_AMBULATORY_CARE_PROVIDER_SITE_OTHER): Payer: BLUE CROSS/BLUE SHIELD | Admitting: Internal Medicine

## 2017-01-07 VITALS — BP 123/80 | HR 101 | Temp 98.6°F | Ht 61.0 in | Wt 214.6 lb

## 2017-01-07 DIAGNOSIS — R200 Anesthesia of skin: Secondary | ICD-10-CM | POA: Diagnosis not present

## 2017-01-07 DIAGNOSIS — Z23 Encounter for immunization: Secondary | ICD-10-CM | POA: Diagnosis not present

## 2017-01-07 DIAGNOSIS — Z6841 Body Mass Index (BMI) 40.0 and over, adult: Secondary | ICD-10-CM

## 2017-01-07 NOTE — Patient Instructions (Addendum)
Ms. Elbert,  Have fun on your cruise this month!  Do try and get 150 minutes of exercise weekly as your schedule allows.  Your blood pressure looks great.  If you end up needing a referral to Neurology, please give [place] a call.  Pap smear is due in November of 2020.  Best, Dr. Sampson Goon

## 2017-01-09 NOTE — Progress Notes (Signed)
[facility] Progress Note  Subjective:  Emily Hicks is a 31 y.o. female who presents for general check-up.    Subjective:   Emily Hicks is a 32 y.o. female with a history of obesity and vitamin D deficiency here for a general check-up.   She reports concern about her weight. She enjoys going to the gym but has not been able to be consistent about this since starting school. She initially had been doing meal prep to help make healthier food choices but says she has also not been doing that as regularly.   Patient completed treatment with high dose vitamin D supplementation and now is taking a daily vitamin.   She notes continued left arm tingling (chronic, not worsening) and reports she plans to return to her Neurologist about this but has been holding off because she is due for a repeat MRI and cannot afford it at the moment; otherwise ROS negative. She has tried gabapentin and nortriptyline in the past without improvement. Denies visual changes.   Social: Does not drink EtOH, smoke cigarettes or use drugs Would like to get back to going to gym. Is in school which has made it harder to find time to exercise. Feels safe in her relationship PHQ2: 0   Gyn concerns/Preventative healthcare  Last menstrual period: No LMP recorded. Patient has had an injection.  Regular periods: no (on depo)  Heavy bleeding: no  Sexually active: yes  Contraception or hormonal therapy: depo for last 6 years since birth of her son  Hx of STD: Patient does not desire STD screening  Dyspareunia: No  Hot flashes: No  Vaginal discharge: No  Dysuria:No   Last mammogram: N/A   Breast mass or concerns: No  Last pap smear: 12/20/15 -- normal; next due 12/2018. Last abnormal was in 2013 - [facility].     PMH, Surgical Hx, Family Hx, Social History reviewed and updated as below. No family history of breast, ovarian, cervical cancer.   Past Medical History:  Diagnosis Date  .  Gallstones   . GERD (gastroesophageal reflux disease)    only prn OTC occasionally  . Headache   . Numbness and tingling of left leg    Past Surgical History:  Procedure Laterality Date  . CHOLECYSTECTOMY N/A 12/28/2013   Procedure: LAPAROSCOPIC CHOLECYSTECTOMY;  Surgeon: Claud Kelp, MD;  Location: [facility];  Service: General;  Laterality: N/A;     Objective: Blood pressure 123/80, pulse (!) 101, temperature 98.6 F (37 C), temperature source Oral, height 5\' 1"  (1.549 m), weight 214 lb 9.6 oz (97.3 kg), SpO2 99 %. Body mass index is 40.55 kg/m. Constitutional: Obese female, pleasant, in NAD HENT: MMM, normal posterior oropharynx Cardiovascular: RRR, S1, S2, no m/r/g.  Pulmonary/Chest: Effort normal and breath sounds normal.  Abdominal: Soft. +BS, NT Musculoskeletal: No LE edema. No midline spinal tenderness.  Neurological: AOx3, no focal deficits. Sensation to light and gross touch symmetric between R and L arms. Grip strength 5/5.  Skin: Skin is warm and dry. No rash noted.  Psychiatric: Normal mood and affect.  Vitals reviewed  Assessment/Plan: Class 3 severe obesity due to excess calories without serious comorbidity with body mass index (BMI) of 40.0 to 44.9 in adult [facility]) - Patient intends to be more consistent with exercise and meal prepping. - Recommended at least 150 minutes of exercise weekly. - Lipid panel performed in 2017 normal aside from low HDL. Last BMP without elevated glucose. - Consider discussing bariatric surgery in  the future if patient does not have success with lifestyle modification.   Left sided numbness - Chronic, stable. - To follow-up with Neurology. Will contact office if she needs a new referral, as it has been over 1 year since she was seen by Dr. Terrace ArabiaYan.  HM: Flu shot administered  Follow-up prn.   Dani GobbleHillary Dash Cardarelli, MD [facility], PGY-3

## 2017-01-11 ENCOUNTER — Encounter: Payer: Self-pay | Admitting: Internal Medicine

## 2017-01-11 NOTE — Assessment & Plan Note (Signed)
-   Chronic, stable. - To follow-up with Neurology. Will contact office if she needs a new referral, as it has been over 1 year since she was seen by Dr. Terrace Arabia.

## 2017-01-11 NOTE — Assessment & Plan Note (Signed)
-   Patient intends to be more consistent with exercise and meal prepping. - Recommended at least 150 minutes of exercise weekly. - Lipid panel performed in 2017 normal aside from low HDL. Last BMP without elevated glucose. - Consider discussing bariatric surgery in the future if patient does not have success with lifestyle modification.

## 2017-03-27 ENCOUNTER — Ambulatory Visit (INDEPENDENT_AMBULATORY_CARE_PROVIDER_SITE_OTHER): Payer: BLUE CROSS/BLUE SHIELD

## 2017-03-27 DIAGNOSIS — Z3042 Encounter for surveillance of injectable contraceptive: Secondary | ICD-10-CM | POA: Diagnosis not present

## 2017-03-27 LAB — POCT URINE PREGNANCY: PREG TEST UR: NEGATIVE

## 2017-03-27 MED ORDER — MEDROXYPROGESTERONE ACETATE 150 MG/ML IM SUSY
150.0000 mg | PREFILLED_SYRINGE | Freq: Once | INTRAMUSCULAR | Status: AC
  Administered 2017-03-27: 150 mg via INTRAMUSCULAR

## 2017-03-27 NOTE — Progress Notes (Signed)
   Patient in to nurse clinic outside of dates for Depo-Provera. Urine pregnancy test negative.   Depo given in [facility]. Patient tolerated well. Advised condom use for next 7-10 days. Condoms offered and declined.   Next depo due between 06/12/17 and 06/26/17. Reminder card given. Ples Specter, RN [facility] RN)

## 2017-06-16 ENCOUNTER — Other Ambulatory Visit: Payer: Self-pay

## 2017-06-16 ENCOUNTER — Ambulatory Visit (INDEPENDENT_AMBULATORY_CARE_PROVIDER_SITE_OTHER): Payer: BLUE CROSS/BLUE SHIELD | Admitting: Internal Medicine

## 2017-06-16 ENCOUNTER — Encounter: Payer: Self-pay | Admitting: Internal Medicine

## 2017-06-16 VITALS — BP 120/76 | HR 90 | Temp 99.0°F | Ht 61.0 in | Wt 220.6 lb

## 2017-06-16 DIAGNOSIS — E559 Vitamin D deficiency, unspecified: Secondary | ICD-10-CM

## 2017-06-16 DIAGNOSIS — Z9289 Personal history of other medical treatment: Secondary | ICD-10-CM | POA: Diagnosis not present

## 2017-06-16 DIAGNOSIS — G8929 Other chronic pain: Secondary | ICD-10-CM | POA: Diagnosis not present

## 2017-06-16 DIAGNOSIS — R252 Cramp and spasm: Secondary | ICD-10-CM

## 2017-06-16 DIAGNOSIS — R6889 Other general symptoms and signs: Secondary | ICD-10-CM

## 2017-06-16 DIAGNOSIS — R531 Weakness: Secondary | ICD-10-CM | POA: Diagnosis not present

## 2017-06-16 DIAGNOSIS — M25562 Pain in left knee: Secondary | ICD-10-CM

## 2017-06-16 NOTE — Patient Instructions (Signed)
Ms. Dix,  I will refer you to sports medicine and neurology. If Dr. Terrace Arabia does not suggest further workup, considering rheumatologic cause would be the next step.  I will call with lab results.  Best, Dr. Sampson Goon

## 2017-06-17 LAB — VITAMIN D 25 HYDROXY (VIT D DEFICIENCY, FRACTURES): VIT D 25 HYDROXY: 17.4 ng/mL — AB (ref 30.0–100.0)

## 2017-06-17 LAB — CBC
Hematocrit: 36.9 % (ref 34.0–46.6)
Hemoglobin: 12 g/dL (ref 11.1–15.9)
MCH: 26.7 pg (ref 26.6–33.0)
MCHC: 32.5 g/dL (ref 31.5–35.7)
MCV: 82 fL (ref 79–97)
PLATELETS: 285 10*3/uL (ref 150–379)
RBC: 4.49 x10E6/uL (ref 3.77–5.28)
RDW: 13.4 % (ref 12.3–15.4)
WBC: 6 10*3/uL (ref 3.4–10.8)

## 2017-06-17 LAB — COMPREHENSIVE METABOLIC PANEL
ALT: 11 IU/L (ref 0–32)
AST: 11 IU/L (ref 0–40)
Albumin/Globulin Ratio: 1.4 (ref 1.2–2.2)
Albumin: 4.2 g/dL (ref 3.5–5.5)
Alkaline Phosphatase: 106 IU/L (ref 39–117)
BUN/Creatinine Ratio: 18 (ref 9–23)
BUN: 12 mg/dL (ref 6–20)
Bilirubin Total: 0.4 mg/dL (ref 0.0–1.2)
CALCIUM: 9.4 mg/dL (ref 8.7–10.2)
CO2: 22 mmol/L (ref 20–29)
CREATININE: 0.68 mg/dL (ref 0.57–1.00)
Chloride: 103 mmol/L (ref 96–106)
GFR calc Af Amer: 136 mL/min/{1.73_m2} (ref >59)
GFR, EST NON AFRICAN AMERICAN: 118 mL/min/{1.73_m2} (ref >59)
Globulin, Total: 2.9 g/dL (ref 1.5–4.5)
Glucose: 84 mg/dL (ref 65–99)
Potassium: 4.1 mmol/L (ref 3.5–5.2)
Sodium: 141 mmol/L (ref 134–144)
Total Protein: 7.1 g/dL (ref 6.0–8.5)

## 2017-06-17 LAB — TSH: TSH: 0.588 u[IU]/mL (ref 0.450–4.500)

## 2017-06-18 ENCOUNTER — Encounter: Payer: Self-pay | Admitting: Internal Medicine

## 2017-06-18 DIAGNOSIS — M25562 Pain in left knee: Secondary | ICD-10-CM

## 2017-06-18 DIAGNOSIS — Z9289 Personal history of other medical treatment: Secondary | ICD-10-CM | POA: Insufficient documentation

## 2017-06-18 DIAGNOSIS — R531 Weakness: Secondary | ICD-10-CM | POA: Insufficient documentation

## 2017-06-18 DIAGNOSIS — G8929 Other chronic pain: Secondary | ICD-10-CM | POA: Insufficient documentation

## 2017-06-18 DIAGNOSIS — R252 Cramp and spasm: Secondary | ICD-10-CM | POA: Insufficient documentation

## 2017-06-18 NOTE — Progress Notes (Signed)
[facility] Progress Note  Subjective:  Emily Hicks is a 32 y.o. female with history of left-sided sensory changes and abnormal brain MRIs who presents with request for neurology referral and sports medicine referral due to ongoing knee pain. Also with body aches.  #Left-sided sensory changes: - Ongoing for years. MRIs in 2011 and 2016 showed nonspecific periventricular and subcortical hyperintensities. She has followed with Dr. Terrace Arabia at [facility] in the past, but she had delayed repeat MRI and follow-up due to lack of insurance. She was told she should have another MRI if symptoms change. - Reports increased intensity of hyperalgesia/tingling of left side. This involves the left side from her mouth to her toes.  - Also notes some increased difficulty focusing vision when looking at something up close to far away - Has cousin with MS who suddenly lost vision - Would like re-referral to [facility] because last seen over a year ago. ROS: Denies weakness. Has not had falls. No fevers.   #Left knee pain: - Sprained this knee in 2017 after she "came down wrong" on a trampoline - Had to use crutches for a couple of weeks - Sometimes feels deep to knee - Concerned now because of decreased mobility with dance class she has been taking - Difficulty with lunges - Pain with stairs  #Body aches: - Reports increased soreness over the last month or so.  - Enjoys walking but gets sore after about 2 miles - Also notes feeler colder than usual   Past Medical History:  Diagnosis Date  . Gallstones   . GERD (gastroesophageal reflux disease)    only prn OTC occasionally  . Headache   . Numbness and tingling of left leg    No Known Allergies  Social History   Tobacco Use  . Smoking status: Never Smoker  . Smokeless tobacco: Never Used  Substance Use Topics  . Alcohol use: No    Objective: Blood pressure 120/76, pulse 90, temperature 99 F (37.2 C), temperature source Oral, height 5\' 1"   (1.549 m), weight 220 lb 9.6 oz (100.1 kg), SpO2 99 %. Body mass index is 41.68 kg/m. Constitutional: Pleasant, obese in NAD HENT: EOMI, PERRL Musculoskeletal: No knee effusion. Mild TTP over medial joint line on left. No pain over quadriceps or patellar tendon on left. FROM with leg extension.  Neurological: AOx3, no focal deficits. Reports increased sensation to sharp touch on left side of body. Patellar reflexes equal bilaterally. 5/5 upper and lower extremity strength.  Skin: Skin is warm and dry. No rash noted.  Psychiatric: Normal mood and affect.  Vitals reviewed  Assessment/Plan: History of sensory changes - Chronic, with increased intensity. Will refer to Neurology for reassessment and likely repeat MRI to look for progression of lesions given concern for MS. If not further assessment recommended, would consider rheumatology referral with history of body aches and positive ANA, though one-sided distribution would not fit with systemic issue.  Chronic pain of left knee - Suspect patellofemoral pain syndrome vs prior knee dislocation vs mild OA given how well patient has been functioning.  - Suggested PT as a start with knee imaging but patient prefers to see sports medicine  Muscle cramps - Will obtain vitamin D level (previously deficient), CBC, TSH, and CMP  Follow-up prn.  Dani Gobble, MD [facility], PGY-3

## 2017-06-18 NOTE — Assessment & Plan Note (Signed)
-   Will obtain vitamin D level (previously deficient), CBC, TSH, and CMP

## 2017-06-18 NOTE — Assessment & Plan Note (Signed)
-   Chronic, with increased intensity. Will refer to Neurology for reassessment and likely repeat MRI to look for progression of lesions given concern for MS. If not further assessment recommended, would consider rheumatology referral with history of body aches and positive ANA, though one-sided distribution would not fit with systemic issue.

## 2017-06-18 NOTE — Assessment & Plan Note (Addendum)
-   Suspect patellofemoral pain syndrome vs mild OA given how well patient has been functioning but could be meniscal injury vs previous patellar dislocation given history of acute onset of pain. - Suggested PT as a start with knee imaging but patient prefers to see sports medicine

## 2017-06-24 ENCOUNTER — Encounter: Payer: Self-pay | Admitting: Family Medicine

## 2017-06-24 ENCOUNTER — Ambulatory Visit (INDEPENDENT_AMBULATORY_CARE_PROVIDER_SITE_OTHER): Payer: BLUE CROSS/BLUE SHIELD | Admitting: Family Medicine

## 2017-06-24 DIAGNOSIS — M2242 Chondromalacia patellae, left knee: Secondary | ICD-10-CM | POA: Insufficient documentation

## 2017-06-24 MED ORDER — METHYLPREDNISOLONE ACETATE 80 MG/ML IJ SUSP
80.0000 mg | Freq: Once | INTRAMUSCULAR | Status: AC
  Administered 2017-06-24: 80 mg via INTRA_ARTICULAR

## 2017-06-24 NOTE — Progress Notes (Signed)
HPI  CC: Chronic knee pain, left Patient is here with left-sided chronic knee pain.  She states that her pain began approximately 2 years ago when she injured it at a trampoline park.  She states that at the time of injury she has been on the trampoline when she fell directly onto her left knee.  She reports the injury as a "buckling" and "twisting" injury.  At that time she was seen in the ED and placed in a knee brace for a "knee sprain".  She states that she has had lingering soreness and stiffness in this knee since that time.  She has pain when standing up from a seated position.  She also has discomfort with stairs and endorses persistent clicking of the knee when going up and down stairs.  She denies any mechanical locking or catching.  No weakness, numbness, or paresthesias.  She denies any current swelling but states that her knee swelled up significantly at the time of her injury 2 years ago.  Traumatic: initially yes  Location: left medial knee  Quality: Soreness stiffness  Duration: 2 years Timing: Stairs and prolonged ambulation  Improving/Worsening: Unchanged Makes better: Rest  Associated symptoms: None  Previous Interventions Tried: Anti-inflammatories and ice  Past Injuries: As described above Past Surgeries: Noncontributory Smoking: Non-smoker Family Hx: Noncontributory  ROS: Per HPI; in addition no fever, no rash, no additional weakness, no additional numbness, no additional paresthesias, and no additional falls/injury.   Objective: BP 132/90   Ht 5\' 1"  (1.549 m)   Wt 220 lb (99.8 kg)   BMI 41.57 kg/m  Gen: NAD, well groomed, a/o x3, normal affect.  CV: Well-perfused. Warm.  Resp: Non-labored.  Neuro: Sensation intact throughout. No gross coordination deficits.  Gait: Nonpathologic posture, unremarkable stride without signs of limp or balance issues. Knee, Left: TTP noted at the medial joint line and slightly superior at the femoral epicondyle. Inspection was  negative for erythema, ecchymosis, and effusion. No obvious bony abnormalities or signs of osteophyte development. Palpation yielded no asymmetric warmth; mild/moderate patellar tenderness with manipulation; No patellar crepitus. Patellar and quadriceps tendons unremarkable, and no tenderness of the pes anserine bursa. No obvious Baker's cyst development. ROM normal in flexion (135 degrees) and extension (0 degrees). Normal hamstring and quadriceps strength. Neurovascularly intact bilaterally.  - Ligaments: (Solid and consistent endpoints)   - ACL (present bilaterally)   - PCL (present bilaterally)   - LCL (present bilaterally)   - MCL (present bilaterally).   - Meniscus:   - McMurray's: NEG  - Patella:   - Patellar grind/compression: Pos for pain   - Patellar glide: Without apprehension  INJECTION: Left Knee Patient was given informed consent, signed copy in the chart. Appropriate time out was taken. Area prepped and draped in usual sterile fashion. 1 cc of methylprednisolone 80 mg/ml plus  3 cc of Marcaine without epinephrine was injected into the left knee using a(n) anterior inferior medial approach. The patient tolerated the procedure well. There were no complications. Post procedure instructions were given.   Assessment and Plan:  Chondromalacia, patella, left Patient is here with signs and symptoms consistent with chondromalacia patella of the left knee.  Differential includes medial meniscus tear.  Patient would like to avoid surgery if at all possible.  No signs or symptoms of instability. -Injection performed today.  Patient tolerated well. -Encouraged regular icing and OTC anti-inflammatories as needed. -HEP: Focusing on quadricep strengthening and hip abductors -Follow-up 4 weeks  Next: Strong consideration for  ultrasound assessment versus MRI if patient has no improvement at follow-up.   Meds ordered this encounter  Medications  . methylPREDNISolone acetate (DEPO-MEDROL)  injection 80 mg     Kathee Delton, MD,MS [facility] Sports Medicine Fellow 06/24/2017 3:08 PM

## 2017-06-24 NOTE — Assessment & Plan Note (Signed)
Patient is here with signs and symptoms consistent with chondromalacia patella of the left knee.  Differential includes medial meniscus tear.  Patient would like to avoid surgery if at all possible.  No signs or symptoms of instability. -Injection performed today.  Patient tolerated well. -Encouraged regular icing and OTC anti-inflammatories as needed. -HEP: Focusing on quadricep strengthening and hip abductors -Follow-up 4 weeks  Next: Strong consideration for ultrasound assessment versus MRI if patient has no improvement at follow-up.

## 2017-06-25 ENCOUNTER — Ambulatory Visit (INDEPENDENT_AMBULATORY_CARE_PROVIDER_SITE_OTHER): Payer: BLUE CROSS/BLUE SHIELD

## 2017-06-25 DIAGNOSIS — Z3042 Encounter for surveillance of injectable contraceptive: Secondary | ICD-10-CM

## 2017-06-25 MED ORDER — MEDROXYPROGESTERONE ACETATE 150 MG/ML IM SUSY
150.0000 mg | PREFILLED_SYRINGE | Freq: Once | INTRAMUSCULAR | Status: AC
  Administered 2017-06-25: 150 mg via INTRAMUSCULAR

## 2017-06-25 NOTE — Progress Notes (Signed)
   Patient in to nurse clinic within dates for Depo-Provera injection. Injection given IM to RUOQ. Patient tolerated well. Next injection due 09/10/17 -09/24/17. Reminder card given. Ples Specter, RN [facility] RN)

## 2017-07-22 ENCOUNTER — Ambulatory Visit (INDEPENDENT_AMBULATORY_CARE_PROVIDER_SITE_OTHER): Payer: BLUE CROSS/BLUE SHIELD | Admitting: Family Medicine

## 2017-07-22 ENCOUNTER — Encounter: Payer: Self-pay | Admitting: Family Medicine

## 2017-07-22 DIAGNOSIS — M2242 Chondromalacia patellae, left knee: Secondary | ICD-10-CM | POA: Diagnosis not present

## 2017-07-22 NOTE — Progress Notes (Signed)
   HPI  CC: Follow-up left knee pain She is here to follow-up regarding her left-sided knee pain.  At the last visit she was diagnosed with chondromalacia patella and provided a intra-articular steroid injection as well as a HEP.  She endorses okay compliance with the HEP.  She states that her knee pain has dramatically improved to the point where it has essentially resolved.  She is very pleased with this progress.  She denies any new injury, trauma, or events since last being seen.  She states that she was able to participate in a game of volleyball and had no pain during or after the game.  Medications/Interventions Tried: Steroid injection, HEP, ice  See HPI and/or previous note for associated ROS.  Objective: BP 108/80   Ht 5\' 1"  (1.549 m)   Wt 220 lb (99.8 kg)   BMI 41.57 kg/m  Repeat physical exam not performed today  Assessment and plan:  Chondromalacia, patella, left Patient is here to follow-up regarding her chondromalacia patella.  Symptoms have nearly resolved.  She is very pleased with her progress.  We spent much of our time discussing preventative care regarding this ailment and discussed continuing the HEP to help with long-term knee health. -Follow-up PRN   I spent 10 minutes with this patient. Over 50% of visit was spent in counseling and coordination of care for problems with left-sided chondromalacia patella.  Kathee Delton, MD,MS [facility] Sports Medicine Fellow 07/22/2017 2:27 PM

## 2017-07-22 NOTE — Assessment & Plan Note (Signed)
Patient is here to follow-up regarding her chondromalacia patella.  Symptoms have nearly resolved.  She is very pleased with her progress.  We spent much of our time discussing preventative care regarding this ailment and discussed continuing the HEP to help with long-term knee health. -Follow-up PRN

## 2017-08-26 ENCOUNTER — Encounter: Payer: Self-pay | Admitting: Neurology

## 2017-08-26 ENCOUNTER — Ambulatory Visit (INDEPENDENT_AMBULATORY_CARE_PROVIDER_SITE_OTHER): Payer: BLUE CROSS/BLUE SHIELD | Admitting: Neurology

## 2017-08-26 VITALS — BP 150/89 | HR 82 | Ht 61.0 in | Wt 224.5 lb

## 2017-08-26 DIAGNOSIS — IMO0002 Reserved for concepts with insufficient information to code with codable children: Secondary | ICD-10-CM

## 2017-08-26 DIAGNOSIS — R202 Paresthesia of skin: Secondary | ICD-10-CM

## 2017-08-26 DIAGNOSIS — G43709 Chronic migraine without aura, not intractable, without status migrainosus: Secondary | ICD-10-CM

## 2017-08-26 DIAGNOSIS — G43009 Migraine without aura, not intractable, without status migrainosus: Secondary | ICD-10-CM | POA: Insufficient documentation

## 2017-08-26 MED ORDER — DULOXETINE HCL 60 MG PO CPEP: 60.0000 mg | ORAL_CAPSULE | Freq: Every day | ORAL | 12 refills | Status: DC

## 2017-08-26 NOTE — Progress Notes (Signed)
PATIENT: RIKAYLA DEMMON DOB: [birth year]  Chief Complaint  Patient presents with  . Left-sided numbness    Reports continued numbness/tingling down the entire left side of her body.  Over the last two months, she has also started having tingling sensations in her right leg.   . Migraine    Last seen in our office on 11/23/14.  No issues with migraines but has frequent mild headaches.  She is no longer taking nortriptyline for preventive measures.  Excedrin typically rids her pain.     HISTORICAL  SHAQUINA GILLHAM is a 32 year old female, seen in request by her primary care physician Dr. Artist Pais, Sharene Skeans, for evaluation of left-sided numbness, migraine headaches, initial evaluation was on August 26, 2017.  She noticed subacute onset of left anterior thigh numbness in 2011, later numbness spreading to involving her whole left side, includes left arm, leg, trunk, left face, initially, she also noticed intermittent left hand and left foot posturing, spasm, lasting 10-15 minutes, without associated loss of consciousness, mild clumsiness on her left side, she noticed that when she dances zumba.  Shortly after the initial symptoms, she was pregnant, we have reviewed MRI of the brain with contrast in 2011, there was few scattered periventricular oval-shaped lesions, does raise the possibility of multiple sclerosis versus small vessel disease.  During pregnancy her symptoms has much improved, she no longer has intermittent left hand feet posturing, but continue have persistent mild left-sided paresthesia, in recent few months, she seems to know is more dense numbness at her left anterior thigh and, and the left side of her body. She denies visual loss,  She also reported a history of headaches in her teens, triggered by stress, sleep deprivation, bilateral frontal pressure headaches, with light noise sensitivity sometimes.  UPDATE Nov 23 2014: She still has left side numbness, from left face to her  left feet, she felt of numbness, hypersensitivity sometimes, she denies gait difficulty, no bowel and bladder incontinence, no visual loss.  She continue has occasionally headaches, 1-2 times each week, mild, usually responded to Tylenol,  We have personally reviewed MRI of the brain in September 2016, and compared to previous scan in 2011, At least 6 small round and ovoid periventricular and subcortical non-specific T2 hyperintensities noted. No abnormal lesions are seen on post contrast views. These findings are non-specific and considerations include autoimmune, inflammatory, post-infectious, microvascular ischemic or migraine associated etiologies. Compared to MRI on 08/10/09, there is 1 new lesion in the left posterior temporal region and 2 new punctate lesions in the left frontal lobe. Other lesions appear unchanged.   Reviewed laboratory evaluation, positive ANA, SSA, normal TSH, Lyme titer, CBC, CMP, RPR, HIV, protein electrophoresis.  UPDATE August 27 2017: Last office visit was on November 23, 2014, she continue complains of intermittent left-sided numbness, more intense almost persistent left anterior lateral thigh paresthesia, sometimes spreading to right leg, also complains of frequent migraine headaches, she is referred to see rheumatologist for her abnormal laboratory evaluations, positive ANA antibodies.  She also complains of episodes of left finger and toes paresthesia, lock-up, lasting for 15 seconds, bilateral feet goes to sleep,  She has tried gabapentin previously without significant benefit.  REVIEW OF SYSTEMS: Full 14 system review of systems performed and notable only for headaches, numbness  ALLERGIES: No Known Allergies  HOME MEDICATIONS: Current Outpatient Medications  Medication Sig Dispense Refill  . clindamycin (CLINDAGEL) 1 % gel Apply topically 2 (two) times daily. 30 g 0  .  ibuprofen (ADVIL,MOTRIN) 200 MG tablet Take 600 mg by mouth every 6 (six) hours as  needed for mild pain or moderate pain.    . medroxyPROGESTERone (DEPO-PROVERA) 150 MG/ML injection Inject 1 mL (150 mg total) into the muscle every 3 (three) months. 1 mL 11  . naproxen (NAPROSYN) 500 MG tablet Take 1 tablet (500 mg total) by mouth 2 (two) times daily. 30 tablet 0  . vitamin B-12 (CYANOCOBALAMIN) 1000 MCG tablet Take 1,000 mcg by mouth daily.     No current facility-administered medications for this visit.    Facility-Administered Medications Ordered in Other Visits  Medication Dose Route Frequency Provider Last Rate Last Dose  . gadopentetate dimeglumine (MAGNEVIST) injection 20 mL  20 mL Intravenous Once PRN Levert Feinstein, MD        PAST MEDICAL HISTORY: Past Medical History:  Diagnosis Date  . Gallstones   . GERD (gastroesophageal reflux disease)    only prn OTC occasionally  . Headache   . Numbness and tingling of left leg   . Numbness and tingling of right leg     PAST SURGICAL HISTORY: Past Surgical History:  Procedure Laterality Date  . CHOLECYSTECTOMY N/A 12/28/2013   Procedure: LAPAROSCOPIC CHOLECYSTECTOMY;  Surgeon: Claud Kelp, MD;  Location: [facility];  Service: General;  Laterality: N/A;    FAMILY HISTORY: Family History  Problem Relation Age of Onset  . Hypertension Father   . Hypertension Mother   . Hypertension Sister   . Diabetes Maternal Grandmother   . Diabetes Maternal Grandfather     SOCIAL HISTORY: Social History   Socioeconomic History  . Marital status: Single    Spouse name: Not on file  . Number of children: 1  . Years of education: 31  . Highest education level: Bachelor's degree (e.g., BA, AB, BS)  Occupational History  . Occupation: Audiological scientist  Social Needs  . Financial resource strain: Not on file  . Food insecurity:    Worry: Not on file    Inability: Not on file  . Transportation needs:    Medical: Not on file    Non-medical: Not on file  Tobacco Use  . Smoking status: Never Smoker  . Smokeless tobacco: Never  Used  Substance and Sexual Activity  . Alcohol use: No  . Drug use: No  . Sexual activity: Not Currently  Lifestyle  . Physical activity:    Days per week: Not on file    Minutes per session: Not on file  . Stress: Not on file  Relationships  . Social connections:    Talks on phone: Not on file    Gets together: Not on file    Attends religious service: Not on file    Active member of club or organization: Not on file    Attends meetings of clubs or organizations: Not on file    Relationship status: Not on file  . Intimate partner violence:    Fear of current or ex partner: Not on file    Emotionally abused: Not on file    Physically abused: Not on file    Forced sexual activity: Not on file  Other Topics Concern  . Not on file  Social History Narrative   Lives at home with husband and son.   Right-handed.   No caffeine use.     PHYSICAL EXAM   Vitals:   08/26/17 1323  BP: (!) 150/89  Pulse: 82  Weight: 224 lb 8 oz (101.8 kg)  Height: 5\' 1"  (  1.549 m)    Not recorded      Body mass index is 42.42 kg/m.  PHYSICAL EXAMNIATION:  Gen: NAD, conversant, well nourised, obese, well groomed                     Cardiovascular: Regular rate rhythm, no peripheral edema, warm, nontender. Eyes: Conjunctivae clear without exudates or hemorrhage Neck: Supple, no carotid bruits. Pulmonary: Clear to auscultation bilaterally   NEUROLOGICAL EXAM:  MENTAL STATUS: Speech:    Speech is normal; fluent and spontaneous with normal comprehension.  Cognition:     Orientation to time, place and person     Normal recent and remote memory     Normal Attention span and concentration     Normal Language, naming, repeating,spontaneous speech     Fund of knowledge   CRANIAL NERVES: CN II: Visual fields are full to confrontation. Fundoscopic exam is normal with sharp discs and no vascular changes. Pupils are round equal and briskly reactive to light. CN III, IV, VI: extraocular  movement are normal. No ptosis. CN V: Facial sensation is intact to pinprick in all 3 divisions bilaterally. Corneal responses are intact.  CN VII: Face is symmetric with normal eye closure and smile. CN VIII: Hearing is normal to rubbing fingers CN IX, X: Palate elevates symmetrically. Phonation is normal. CN XI: Head turning and shoulder shrug are intact CN XII: Tongue is midline with normal movements and no atrophy.  MOTOR: There is no pronator drift of out-stretched arms. Muscle bulk and tone are normal. Muscle strength is normal.  REFLEXES: Reflexes are 2+ and symmetric at the biceps, triceps, knees, and ankles. Plantar responses are flexor.  SENSORY: Intact to light touch, pinprick, positional sensation and vibratory sensation are intact in fingers and toes.  COORDINATION: Rapid alternating movements and fine finger movements are intact. There is no dysmetria on finger-to-nose and heel-knee-shin.    GAIT/STANCE: Posture is normal. Gait is steady with normal steps, base, arm swing, and turning. Heel and toe walking are normal. Tandem gait is normal.  Romberg is absent.   DIAGNOSTIC DATA (LABS, IMAGING, TESTING) - I reviewed patient records, labs, notes, testing and imaging myself where available.   ASSESSMENT AND PLAN  MALYSSA MARIS is a 32 y.o. female   Left-sided paresthesia, left side persistent numbness,  Possible left meralgia paresthetica, but would not explain her intermittent more widely spread bilateral upper lower extremity paresthesia.  EMG nerve conduction study to rule out peripheral nervous system etiology.  Add on Cymbalta 60 mg daily     Levert Feinstein, M.D. Ph.D.  [facility] [address] [place], [place] [address] Ph: [phone] Fax: [phone]  CC:  Leland Her, DO

## 2017-09-25 ENCOUNTER — Ambulatory Visit (INDEPENDENT_AMBULATORY_CARE_PROVIDER_SITE_OTHER): Payer: BLUE CROSS/BLUE SHIELD | Admitting: Neurology

## 2017-09-25 DIAGNOSIS — Z0289 Encounter for other administrative examinations: Secondary | ICD-10-CM

## 2017-09-25 DIAGNOSIS — R202 Paresthesia of skin: Secondary | ICD-10-CM

## 2017-09-25 DIAGNOSIS — G43709 Chronic migraine without aura, not intractable, without status migrainosus: Secondary | ICD-10-CM

## 2017-09-25 DIAGNOSIS — IMO0002 Reserved for concepts with insufficient information to code with codable children: Secondary | ICD-10-CM

## 2017-09-25 MED ORDER — ALPRAZOLAM 1 MG PO TABS: 1.0000 mg | ORAL_TABLET | Freq: Every evening | ORAL | 0 refills | Status: DC | PRN

## 2017-09-25 NOTE — Procedures (Signed)
Full Name: Emily Hicks Gender: Female MRN #: [id number] Date of Birth: 26-Feb-1985    Visit Date: 09/25/17 10:21 Age: 32 Years 3 Months Old Examining Physician: Levert Feinstein, MD  Referring Physician: Terrace Arabia, MD History: 32 years old female, with persistent left-sided numbness involving left upper and lower extremity  Summary of the tests:  Nerve conduction study: Left sural, superficial peroneal sensory responses were normal.  Left tibial, peroneal to EDB motor responses were normal.  Left median and ulnar sensory and motor responses were normal.  Electromyography: Selective needle examination of left upper and lower extremity, left cervical and lumbosacral paraspinal muscles were normal.    Conclusion: This is a normal study.  There is no electrodiagnostic evidence of left cervical radiculopathy or left lumbosacral radiculopathy.    ------------------------------- Levert Feinstein, M.D. PhD  [facility] [address] [place], [place] [address] Tel: [phone] Fax: [phone]        [facility]    Nerve / Sites Muscle Latency Ref. Amplitude Ref. Rel Amp Segments Distance Velocity Ref. Area    ms ms mV mV %  cm m/s m/s mVms  L Median - APB     Wrist APB 3.1 ?4.4 10.1 ?4.0 100 Wrist - APB 7   38.6     Upper arm APB 6.5  9.7  95.8 Upper arm - Wrist 20 59 ?49 35.8  L Ulnar - ADM     Wrist ADM 2.4 ?3.3 7.5 ?6.0 100 Wrist - ADM 7   24.3     B.Elbow ADM 6.1  6.9  92 B.Elbow - Wrist 20 55 ?49 25.1     A.Elbow ADM 7.9  6.8  97.8 A.Elbow - B.Elbow 10 56 ?49 23.0         A.Elbow - Wrist      L Peroneal - EDB     Ankle EDB 4.3 ?6.5 9.5 ?2.0 100 Ankle - EDB 9   26.1     Fib head EDB 9.7  9.1  95.5 Fib head - Ankle 31 58 ?44 25.3     Pop fossa EDB 11.4  8.7  95.5 Pop fossa - Fib head 10 60 ?44 26.6         Pop fossa - Ankle      L Tibial - AH     Ankle AH 4.0 ?5.8 13.7 ?4.0 100 Ankle - AH 9   31.7     Pop fossa AH 10.7  10.4  75.7 Pop fossa - Ankle 36 54 ?41 29.7          SNC    Nerve / Sites Rec. Site Peak Lat Ref.  Amp Ref. Segments Distance    ms ms V V  cm  L Radial - Anatomical snuff box (Forearm)     Forearm Wrist 2.2 ?2.9 29 ?15 Forearm - Wrist 10  L Sural - Ankle (Calf)     Calf Ankle 3.6 ?4.4 21 ?6 Calf - Ankle 14  L Superficial peroneal - Ankle     Lat leg Ankle 3.5 ?4.4 11 ?6 Lat leg - Ankle 14  L Median - Orthodromic (Dig II, Mid palm)     Dig II Wrist 2.9 ?3.4 19 ?10 Dig II - Wrist 13  L Ulnar - Orthodromic, (Dig V, Mid palm)     Dig V Wrist 2.6 ?3.1 11 ?5 Dig V - Wrist 11  F  Wave    Nerve F Lat Ref.   ms ms  L Tibial - AH 44.0 ?56.0  L Ulnar - ADM 25.0 ?32.0         EMG full       EMG Summary Table    Spontaneous MUAP Recruitment  Muscle IA Fib PSW Fasc Other Amp Dur. Poly Pattern  L. Pronator teres Normal None None None _______ Normal Normal Normal Normal  L. First dorsal interosseous Normal None None None _______ Normal Normal Normal Normal  L. Triceps brachii Normal None None None _______ Normal Normal Normal Normal  L. Deltoid Normal None None None _______ Normal Normal Normal Normal  L. Biceps brachii Normal None None None _______ Normal Normal Normal Normal  L. Extensor digitorum communis Normal None None None _______ Normal Normal Normal Normal  L. Cervical paraspinals Normal None None None _______ Normal Normal Normal Normal  L. Tibialis anterior Normal None None None _______ Normal Normal Normal Normal  L. Tibialis posterior Normal None None None _______ Normal Normal Normal Normal  L. Peroneus longus Normal None None None _______ Normal Normal Normal Normal  L. Vastus lateralis Normal None None None _______ Normal Normal Normal Normal  L. Biceps femoris (short head) Normal None None None _______ Normal Normal Normal Normal  L. Lumbar paraspinals (mid) Normal None None None _______ Normal Normal Normal Normal  L. Lumbar paraspinals (low) Normal None None None _______ Normal Normal Normal Normal

## 2017-09-25 NOTE — Progress Notes (Signed)
PATIENT: Emily Hicks DOB: [birth year]  No chief complaint on file.    HISTORICAL  Emily Hicks is a 32 year old female, seen in request by her primary care physician Dr. Artist Pais, Sharene Skeans, for evaluation of left-sided numbness, migraine headaches, initial evaluation was on August 26, 2017.  She noticed subacute onset of left anterior thigh numbness in 2011, later numbness spreading to involving her whole left side, includes left arm, leg, trunk, left face, initially, she also noticed intermittent left hand and left foot posturing, spasm, lasting 10-15 minutes, without associated loss of consciousness, mild clumsiness on her left side, she noticed that when she dances zumba.  Shortly after the initial symptoms, she was pregnant, we have reviewed MRI of the brain with contrast in 2011, there was few scattered periventricular oval-shaped lesions, does raise the possibility of multiple sclerosis versus small vessel disease.  During pregnancy her symptoms has much improved, she no longer has intermittent left hand feet posturing, but continue have persistent mild left-sided paresthesia, in recent few months, she seems to know is more dense numbness at her left anterior thigh and, and the left side of her body. She denies visual loss,  She also reported a history of headaches in her teens, triggered by stress, sleep deprivation, bilateral frontal pressure headaches, with light noise sensitivity sometimes.  UPDATE Nov 23 2014: She still has left side numbness, from left face to her left feet, she felt of numbness, hypersensitivity sometimes, she denies gait difficulty, no bowel and bladder incontinence, no visual loss.  She continue has occasionally headaches, 1-2 times each week, mild, usually responded to Tylenol,  We have personally reviewed MRI of the brain in September 2016, and compared to previous scan in 2011, At least 6 small round and ovoid periventricular and subcortical  non-specific T2 hyperintensities noted. No abnormal lesions are seen on post contrast views. These findings are non-specific and considerations include autoimmune, inflammatory, post-infectious, microvascular ischemic or migraine associated etiologies. Compared to MRI on 08/10/09, there is 1 new lesion in the left posterior temporal region and 2 new punctate lesions in the left frontal lobe. Other lesions appear unchanged.   Reviewed laboratory evaluation, positive ANA, SSA, normal TSH, Lyme titer, CBC, CMP, RPR, HIV, protein electrophoresis.  UPDATE August 27 2017: Last office visit was on November 23, 2014, she continue complains of intermittent left-sided numbness, more intense almost persistent left anterior lateral thigh paresthesia, sometimes spreading to right leg, also complains of frequent migraine headaches, she is referred to see rheumatologist for her abnormal laboratory evaluations, positive ANA antibodies.  She also complains of episodes of left finger and toes paresthesia, lock-up, lasting for 15 seconds, bilateral feet goes to sleep,  She has tried gabapentin previously without significant benefit.  UPDATE September 25 2017: She return for electrodiagnostic study today, which was normal, in specific, there was no left cervical radiculopathy, left lumbosacral radiculopathy,  She continue complains constant left arm, hands, lower extremity paresthesia, Cymbalta 60 mg every day has caused upset stomach  Laboratory evaluation in May 2019 showed normal negative CMP, CBC, TSH, low vitamin D of 17  REVIEW OF SYSTEMS: Full 14 system review of systems performed and notable only for headaches, numbness  ALLERGIES: No Known Allergies  HOME MEDICATIONS: Current Outpatient Medications  Medication Sig Dispense Refill  . clindamycin (CLINDAGEL) 1 % gel Apply topically 2 (two) times daily. 30 g 0  . DULoxetine (CYMBALTA) 60 MG capsule Take 1 capsule (60 mg total) by mouth daily. 30  capsule 12    . ibuprofen (ADVIL,MOTRIN) 200 MG tablet Take 600 mg by mouth every 6 (six) hours as needed for mild pain or moderate pain.    . medroxyPROGESTERone (DEPO-PROVERA) 150 MG/ML injection Inject 1 mL (150 mg total) into the muscle every 3 (three) months. 1 mL 11  . naproxen (NAPROSYN) 500 MG tablet Take 1 tablet (500 mg total) by mouth 2 (two) times daily. 30 tablet 0  . vitamin B-12 (CYANOCOBALAMIN) 1000 MCG tablet Take 1,000 mcg by mouth daily.     No current facility-administered medications for this visit.    Facility-Administered Medications Ordered in Other Visits  Medication Dose Route Frequency Provider Last Rate Last Dose  . gadopentetate dimeglumine (MAGNEVIST) injection 20 mL  20 mL Intravenous Once PRN Levert Feinstein, MD        PAST MEDICAL HISTORY: Past Medical History:  Diagnosis Date  . Gallstones   . GERD (gastroesophageal reflux disease)    only prn OTC occasionally  . Headache   . Numbness and tingling of left leg   . Numbness and tingling of right leg     PAST SURGICAL HISTORY: Past Surgical History:  Procedure Laterality Date  . CHOLECYSTECTOMY N/A 12/28/2013   Procedure: LAPAROSCOPIC CHOLECYSTECTOMY;  Surgeon: Claud Kelp, MD;  Location: [facility];  Service: General;  Laterality: N/A;    FAMILY HISTORY: Family History  Problem Relation Age of Onset  . Hypertension Father   . Hypertension Mother   . Hypertension Sister   . Diabetes Maternal Grandmother   . Diabetes Maternal Grandfather     SOCIAL HISTORY: Social History   Socioeconomic History  . Marital status: Single    Spouse name: Not on file  . Number of children: 1  . Years of education: 58  . Highest education level: Bachelor's degree (e.g., BA, AB, BS)  Occupational History  . Occupation: Audiological scientist  Social Needs  . Financial resource strain: Not on file  . Food insecurity:    Worry: Not on file    Inability: Not on file  . Transportation needs:    Medical: Not on file    Non-medical:  Not on file  Tobacco Use  . Smoking status: Never Smoker  . Smokeless tobacco: Never Used  Substance and Sexual Activity  . Alcohol use: No  . Drug use: No  . Sexual activity: Not Currently  Lifestyle  . Physical activity:    Days per week: Not on file    Minutes per session: Not on file  . Stress: Not on file  Relationships  . Social connections:    Talks on phone: Not on file    Gets together: Not on file    Attends religious service: Not on file    Active member of club or organization: Not on file    Attends meetings of clubs or organizations: Not on file    Relationship status: Not on file  . Intimate partner violence:    Fear of current or ex partner: Not on file    Emotionally abused: Not on file    Physically abused: Not on file    Forced sexual activity: Not on file  Other Topics Concern  . Not on file  Social History Narrative   Lives at home with husband and son.   Right-handed.   No caffeine use.     PHYSICAL EXAM   There were no vitals filed for this visit.  Not recorded      There is no  height or weight on file to calculate BMI.  PHYSICAL EXAMNIATION:  Gen: NAD, conversant, well nourised, obese, well groomed                     Cardiovascular: Regular rate rhythm, no peripheral edema, warm, nontender. Eyes: Conjunctivae clear without exudates or hemorrhage Neck: Supple, no carotid bruits. Pulmonary: Clear to auscultation bilaterally   NEUROLOGICAL EXAM:  MENTAL STATUS: Speech:    Speech is normal; fluent and spontaneous with normal comprehension.  Cognition:     Orientation to time, place and person     Normal recent and remote memory     Normal Attention span and concentration     Normal Language, naming, repeating,spontaneous speech     Fund of knowledge   CRANIAL NERVES: CN II: Visual fields are full to confrontation. Fundoscopic exam is normal with sharp discs and no vascular changes. Pupils are round equal and briskly reactive to  light. CN III, IV, VI: extraocular movement are normal. No ptosis. CN V: Facial sensation is intact to pinprick in all 3 divisions bilaterally. Corneal responses are intact.  CN VII: Face is symmetric with normal eye closure and smile. CN VIII: Hearing is normal to rubbing fingers CN IX, X: Palate elevates symmetrically. Phonation is normal. CN XI: Head turning and shoulder shrug are intact CN XII: Tongue is midline with normal movements and no atrophy.  MOTOR: There is no pronator drift of out-stretched arms. Muscle bulk and tone are normal. Muscle strength is normal.  REFLEXES: Reflexes are 2+ and symmetric at the biceps, triceps, knees, and ankles. Plantar responses are flexor.  SENSORY: Intact to light touch, pinprick, positional sensation and vibratory sensation are intact in fingers and toes.  COORDINATION: Rapid alternating movements and fine finger movements are intact. There is no dysmetria on finger-to-nose and heel-knee-shin.    GAIT/STANCE: Posture is normal. Gait is steady with normal steps, base, arm swing, and turning. Heel and toe walking are normal. Tandem gait is normal.  Romberg is absent.   DIAGNOSTIC DATA (LABS, IMAGING, TESTING) - I reviewed patient records, labs, notes, testing and imaging myself where available.   ASSESSMENT AND PLAN  Emily Hicks is a 32 y.o. female   Left-sided paresthesia, left side persistent numbness,  Possible left meralgia paresthetica, but would not explain her intermittent more widely spread bilateral upper lower extremity paresthesia.  EMG nerve conduction study was normal  Keep Cymbalta 60 mg daily  MRI of brain, cervical spine to rule out central nervous system etiology,     Levert Feinstein, M.D. Ph.D.  [facility] [address] [place], [place] [address] Ph: [phone] Fax: [phone]  CC:  Leland Her, DO

## 2017-09-25 NOTE — Progress Notes (Signed)
See emg/ncs report under procedure note

## 2017-09-28 ENCOUNTER — Telehealth: Payer: Self-pay | Admitting: Neurology

## 2017-09-28 NOTE — Telephone Encounter (Signed)
[employer] Auth: [id number] (exp. 09/28/17 to 10/27/17) order sent to GI. They will reach out to the pt to schedule.

## 2017-09-29 ENCOUNTER — Ambulatory Visit (INDEPENDENT_AMBULATORY_CARE_PROVIDER_SITE_OTHER): Payer: BLUE CROSS/BLUE SHIELD

## 2017-09-29 DIAGNOSIS — Z3042 Encounter for surveillance of injectable contraceptive: Secondary | ICD-10-CM

## 2017-09-29 LAB — POCT URINE PREGNANCY: PREG TEST UR: NEGATIVE

## 2017-09-29 MED ORDER — MEDROXYPROGESTERONE ACETATE 150 MG/ML IM SUSY
150.0000 mg | PREFILLED_SYRINGE | Freq: Once | INTRAMUSCULAR | Status: AC
  Administered 2017-09-29: 150 mg via INTRAMUSCULAR

## 2017-09-29 NOTE — Progress Notes (Signed)
Patient here today for Depo Provera injection.  Last Depo given on 06/25/17 and is late for Depo.  Obtained urine pregnancy test today and is negative.   Depo given today LUOQ.  Site unremarkable & patient tolerated injection.  Next injection due November 12-26 and reminder card given.   Shawna Orleans, RN

## 2017-12-29 ENCOUNTER — Ambulatory Visit (INDEPENDENT_AMBULATORY_CARE_PROVIDER_SITE_OTHER): Payer: BLUE CROSS/BLUE SHIELD | Admitting: *Deleted

## 2017-12-29 DIAGNOSIS — Z3042 Encounter for surveillance of injectable contraceptive: Secondary | ICD-10-CM

## 2017-12-29 MED ORDER — MEDROXYPROGESTERONE ACETATE 150 MG/ML IM SUSY
150.0000 mg | PREFILLED_SYRINGE | Freq: Once | INTRAMUSCULAR | Status: AC
  Administered 2017-12-29: 150 mg via INTRAMUSCULAR

## 2017-12-29 NOTE — Progress Notes (Signed)
Patient here today for Depo Provera injection.  Depo given today RUOQ.  Site unremarkable & patient tolerated injection.  Next injection due 03/16/18 - 03/30/18.  Reminder card given.    Jennipher Weatherholtz, Maryjo Rochester, CMA

## 2018-01-19 ENCOUNTER — Ambulatory Visit (INDEPENDENT_AMBULATORY_CARE_PROVIDER_SITE_OTHER): Payer: BLUE CROSS/BLUE SHIELD

## 2018-01-19 DIAGNOSIS — Z23 Encounter for immunization: Secondary | ICD-10-CM

## 2018-01-19 NOTE — Progress Notes (Signed)
Pt presents for flu vaccine. Injection given LD, site unremarkable. Epic and [facility] updated.

## 2018-02-03 DIAGNOSIS — Z9289 Personal history of other medical treatment: Secondary | ICD-10-CM

## 2018-02-03 HISTORY — DX: Personal history of other medical treatment: Z92.89

## 2018-03-09 ENCOUNTER — Telehealth: Payer: Self-pay | Admitting: Neurology

## 2018-03-09 NOTE — Telephone Encounter (Signed)
Pt states she was never contacted by GI and her time to get the MRI was between (exp. 09/28/17 to 10/27/17) pt asking if another order can be placed for her MRI

## 2018-03-09 NOTE — Telephone Encounter (Signed)
Spoke with Emily Hicks and explained auth for MRI in Aug. 2019 has expired. Ins. requires recent documentation showing continued need for MRI. She verbalized understanding of same. Appt. given 04/29/18, arrival time of 1130 for a 1200 appt. Pt. agreeable, sts. no rush, just wants to start the process/fim

## 2018-03-10 NOTE — Telephone Encounter (Signed)
Spoke with Asher Muir. Dr. Terrace Arabia had a cancellation tomorrow, arrival time 10am for a 1030 appt., so I offered this to pt. She accepted.  March appt. cancelled since she is coming in tomorrow/fim

## 2018-03-11 ENCOUNTER — Ambulatory Visit (INDEPENDENT_AMBULATORY_CARE_PROVIDER_SITE_OTHER): Payer: BLUE CROSS/BLUE SHIELD | Admitting: Neurology

## 2018-03-11 ENCOUNTER — Other Ambulatory Visit: Payer: Self-pay

## 2018-03-11 ENCOUNTER — Encounter: Payer: Self-pay | Admitting: Neurology

## 2018-03-11 VITALS — BP 132/88 | HR 92 | Resp 16 | Ht 61.0 in | Wt 213.0 lb

## 2018-03-11 DIAGNOSIS — R202 Paresthesia of skin: Secondary | ICD-10-CM | POA: Diagnosis not present

## 2018-03-11 DIAGNOSIS — R252 Cramp and spasm: Secondary | ICD-10-CM | POA: Diagnosis not present

## 2018-03-11 DIAGNOSIS — IMO0002 Reserved for concepts with insufficient information to code with codable children: Secondary | ICD-10-CM

## 2018-03-11 DIAGNOSIS — G43709 Chronic migraine without aura, not intractable, without status migrainosus: Secondary | ICD-10-CM | POA: Diagnosis not present

## 2018-03-11 NOTE — Progress Notes (Signed)
PATIENT: Emily Hicks DOB: 1985-12-05  Chief Complaint  Patient presents with  . Numbness    Rm. 4. Sts. left sided numbness is about the same since last ov. Has  new c/o right hand soreness, cramping with activity such as writing or combing her hair. Onset several mos. ago, worse in the last 2 mos. Some cramping in her right leg/foot. Sts. h/a's have been better. Not taking as much Excedrin.  Was not able to tolerate Cymbalta (nausea)/fim  . Migraine  . Abnormal MRI Brain     HISTORICAL  Emily Hicks is a 33 year old female, seen in request by her primary care physician Dr. Artist Pais, Sharene Skeans, for evaluation of left-sided numbness, migraine headaches, initial evaluation was on August 26, 2017.  She noticed subacute onset of left anterior thigh numbness in 2011, later numbness spreading to involving her whole left side, includes left arm, leg, trunk, left face, initially, she also noticed intermittent left hand and left foot posturing, spasm, lasting 10-15 minutes, without associated loss of consciousness, mild clumsiness on her left side, she noticed that when she dances zumba.  Shortly after the initial symptoms, she was pregnant, we have reviewed MRI of the brain with contrast in 2011, there was few scattered periventricular oval-shaped lesions, does raise the possibility of multiple sclerosis versus small vessel disease.  During pregnancy her symptoms has much improved, she no longer has intermittent left hand feet posturing, but continue have persistent mild left-sided paresthesia, in recent few months, she seems to know is more dense numbness at her left anterior thigh and, and the left side of her body. She denies visual loss,  She also reported a history of headaches in her teens, triggered by stress, sleep deprivation, bilateral frontal pressure headaches, with light noise sensitivity sometimes.  UPDATE Nov 23 2014: She still has left side numbness, from left face to her left  feet, she felt of numbness, hypersensitivity sometimes, she denies gait difficulty, no bowel and bladder incontinence, no visual loss.  She continue has occasionally headaches, 1-2 times each week, mild, usually responded to Tylenol,  We have personally reviewed MRI of the brain in September 2016, and compared to previous scan in 2011, At least 6 small round and ovoid periventricular and subcortical non-specific T2 hyperintensities noted. No abnormal lesions are seen on post contrast views. These findings are non-specific and considerations include autoimmune, inflammatory, post-infectious, microvascular ischemic or migraine associated etiologies. Compared to MRI on 08/10/09, there is 1 new lesion in the left posterior temporal region and 2 new punctate lesions in the left frontal lobe. Other lesions appear unchanged.   Reviewed laboratory evaluation, positive ANA, SSA, normal TSH, Lyme titer, CBC, CMP, RPR, HIV, protein electrophoresis.  UPDATE August 27 2017: Last office visit was on November 23, 2014, she continue complains of intermittent left-sided numbness, more intense almost persistent left anterior lateral thigh paresthesia, sometimes spreading to right leg, also complains of frequent migraine headaches, she is referred to see rheumatologist for her abnormal laboratory evaluations, positive ANA antibodies.  She also complains of episodes of left finger and toes paresthesia, lock-up, lasting for 15 seconds, bilateral feet goes to sleep,  She has tried gabapentin previously without significant benefit.  UPDATE September 25 2017: She return for electrodiagnostic study today, which was normal, in specific, there was no left cervical radiculopathy, left lumbosacral radiculopathy,  She continue complains constant left arm, hands, lower extremity paresthesia, Cymbalta 60 mg every day has caused upset stomach  Laboratory evaluation  in May 2019 showed normal negative CMP, CBC, TSH, low vitamin D of  17  UPDATE Mar 11 2018: She continues to have constant numbness at the left side of her face, body, limbs, frequent needle pricking sensation, recent onset intermittent right hand paresthesia, sometimes right foot paresthesia, she denies gait abnormality. We again personally reviewed MRI of the brain in 2016, scattered supratentorium T2/flair hyperintensity, compared to MRI in 2011, 1 new lesion in the left posterior temporal region,  I have ordered MRI of brain and cervical spine in 2019, but she did not have it scheduled.  Previous laboratory evaluations showed positive ANA, SSA, she does has dry eyes contributed to her contact lens, vitamin D deficiency 17, she only takes it intermittently  REVIEW OF SYSTEMS: Full 14 system review of systems performed and notable only for headaches, numbness All rest review of the system were negative. ALLERGIES: No Known Allergies  HOME MEDICATIONS: Current Outpatient Medications  Medication Sig Dispense Refill  . medroxyPROGESTERone (DEPO-PROVERA) 150 MG/ML injection Inject 1 mL (150 mg total) into the muscle every 3 (three) months. 1 mL 11   No current facility-administered medications for this visit.    Facility-Administered Medications Ordered in Other Visits  Medication Dose Route Frequency Provider Last Rate Last Dose  . gadopentetate dimeglumine (MAGNEVIST) injection 20 mL  20 mL Intravenous Once PRN Levert Feinstein, MD        PAST MEDICAL HISTORY: Past Medical History:  Diagnosis Date  . Gallstones   . GERD (gastroesophageal reflux disease)    only prn OTC occasionally  . Headache   . Numbness and tingling of left leg   . Numbness and tingling of right leg     PAST SURGICAL HISTORY: Past Surgical History:  Procedure Laterality Date  . CHOLECYSTECTOMY N/A 12/28/2013   Procedure: LAPAROSCOPIC CHOLECYSTECTOMY;  Surgeon: Claud Kelp, MD;  Location: [facility];  Service: General;  Laterality: N/A;    FAMILY HISTORY: Family History    Problem Relation Age of Onset  . Hypertension Father   . Hypertension Mother   . Hypertension Sister   . Diabetes Maternal Grandmother   . Diabetes Maternal Grandfather     SOCIAL HISTORY: Social History   Socioeconomic History  . Marital status: Single    Spouse name: Not on file  . Number of children: 1  . Years of education: 13  . Highest education level: Bachelor's degree (e.g., BA, AB, BS)  Occupational History  . Occupation: Audiological scientist  Social Needs  . Financial resource strain: Not on file  . Food insecurity:    Worry: Not on file    Inability: Not on file  . Transportation needs:    Medical: Not on file    Non-medical: Not on file  Tobacco Use  . Smoking status: Never Smoker  . Smokeless tobacco: Never Used  Substance and Sexual Activity  . Alcohol use: No  . Drug use: No  . Sexual activity: Not Currently  Lifestyle  . Physical activity:    Days per week: Not on file    Minutes per session: Not on file  . Stress: Not on file  Relationships  . Social connections:    Talks on phone: Not on file    Gets together: Not on file    Attends religious service: Not on file    Active member of club or organization: Not on file    Attends meetings of clubs or organizations: Not on file    Relationship status: Not on  file  . Intimate partner violence:    Fear of current or ex partner: Not on file    Emotionally abused: Not on file    Physically abused: Not on file    Forced sexual activity: Not on file  Other Topics Concern  . Not on file  Social History Narrative   Lives at home with husband and son.   Right-handed.   No caffeine use.     PHYSICAL EXAM   Vitals:   03/11/18 1030  BP: 132/88  Pulse: 92  Resp: 16  Weight: 213 lb (96.6 kg)  Height: 5\' 1"  (1.549 m)    Not recorded      Body mass index is 40.25 kg/m.  PHYSICAL EXAMNIATION:  Gen: NAD, conversant, well nourised, obese, well groomed                     Cardiovascular: Regular rate  rhythm, no peripheral edema, warm, nontender. Eyes: Conjunctivae clear without exudates or hemorrhage Neck: Supple, no carotid bruits. Pulmonary: Clear to auscultation bilaterally   NEUROLOGICAL EXAM:  MENTAL STATUS: Speech:    Speech is normal; fluent and spontaneous with normal comprehension.  Cognition:     Orientation to time, place and person     Normal recent and remote memory     Normal Attention span and concentration     Normal Language, naming, repeating,spontaneous speech     Fund of knowledge   CRANIAL NERVES: CN II: Visual fields are full to confrontation. Fundoscopic exam is normal with sharp discs and no vascular changes. Pupils are round equal and briskly reactive to light. CN III, IV, VI: extraocular movement are normal. No ptosis. CN V: Facial sensation is intact to pinprick in all 3 divisions bilaterally. Corneal responses are intact.  CN VII: Face is symmetric with normal eye closure and smile. CN VIII: Hearing is normal to rubbing fingers CN IX, X: Palate elevates symmetrically. Phonation is normal. CN XI: Head turning and shoulder shrug are intact CN XII: Tongue is midline with normal movements and no atrophy.  MOTOR: There is no pronator drift of out-stretched arms. Muscle bulk and tone are normal. Muscle strength is normal.  REFLEXES: Reflexes are 2+ and symmetric at the biceps, triceps, knees, and ankles. Plantar responses are flexor.  SENSORY: Intact to light touch, pinprick, positional sensation and vibratory sensation are intact in fingers and toes.  COORDINATION: Rapid alternating movements and fine finger movements are intact. There is no dysmetria on finger-to-nose and heel-knee-shin.    GAIT/STANCE: Posture is normal. Gait is steady with normal steps, base, arm swing, and turning. Heel and toe walking are normal. Tandem gait is normal.  Romberg is absent.   DIAGNOSTIC DATA (LABS, IMAGING, TESTING) - I reviewed patient records, labs, notes,  testing and imaging myself where available.   ASSESSMENT AND PLAN  Emily Hicks is a 33 y.o. female   Left-sided paresthesia, left side persistent numbness,  Abnormal MRI of the brain in the past, most suggestive of small vessel disease,  EMG nerve conduction study was normal  Patient worries about the possibility of multiple sclerosis, desires further evaluation, will proceed with MRI of the brain, cervical spine      Levert FeinsteinYijun Gailya Tauer, M.D. Ph.D.  [facility] [address], [address] [place], [place] [address] Ph: [phone] Fax: [phone]  CC:  Leland HerYoo, Elsia J, DO

## 2018-03-16 ENCOUNTER — Telehealth: Payer: Self-pay | Admitting: Neurology

## 2018-03-16 MED ORDER — ALPRAZOLAM 1 MG PO TABS: ORAL_TABLET | ORAL | 0 refills | Status: DC

## 2018-03-16 NOTE — Addendum Note (Signed)
Addended by: Levert Feinstein on: 03/16/2018 01:30 PM   Modules accepted: Orders

## 2018-03-16 NOTE — Telephone Encounter (Signed)
Returned call to patient.  States she is claustrophobic and needs a mild sedative for her MRI scans.  I confirmed she has no medication allergies.  She will have a driver with her the day of the scan.  Xanax, per MRI protocol, sent to Dr. Terrace Arabia for approval.  The patient is aware to check with her pharmacy for the prescription.

## 2018-03-16 NOTE — Addendum Note (Signed)
Addended by: Lindell Spar C on: 03/16/2018 11:11 AM   Modules accepted: Orders

## 2018-03-16 NOTE — Telephone Encounter (Signed)
MR Brain wo contrast & MR Cervical spine wo contrast Dr. Mertie Clause Auth: [id number] (exp. 03/15/18 to 04/13/18). Patient is scheduled at [facility] for 03/24/18. Patient did inform me she is slightly claustrophobic and would like something to help her.

## 2018-03-24 ENCOUNTER — Ambulatory Visit: Payer: BLUE CROSS/BLUE SHIELD

## 2018-03-24 DIAGNOSIS — R252 Cramp and spasm: Secondary | ICD-10-CM | POA: Diagnosis not present

## 2018-03-24 DIAGNOSIS — G43709 Chronic migraine without aura, not intractable, without status migrainosus: Secondary | ICD-10-CM

## 2018-03-24 DIAGNOSIS — R202 Paresthesia of skin: Secondary | ICD-10-CM

## 2018-03-24 DIAGNOSIS — IMO0002 Reserved for concepts with insufficient information to code with codable children: Secondary | ICD-10-CM

## 2018-03-29 ENCOUNTER — Telehealth: Payer: Self-pay | Admitting: Neurology

## 2018-03-29 NOTE — Telephone Encounter (Signed)
Please call patient, MRI of cervical spine showed evidence of spinal cord signal changes at left C4, C5, right C6 and 7, this findings would explain her complaints of left side paresthesia,  MRI of the brain showed no significant change,  Please call patient for an earlier follow-up appointment   IMPRESSION:   Abnormal MRI cervical spine (without) demonstrating: - Intrinsic T2 hyperintense spinal cord lesions at C4 (left), C5 (left) and C6-C7 (right) levels. Considerations include demyelinating, autoimmune, inflammatory, post-infectious or microvascular ischemic etiologies.   IMPRESSION:   MRI brain (without) demonstrating: - Few scattered periventricular and subcortical foci of non-specific gliosis.  - No acute findings.

## 2018-03-29 NOTE — Telephone Encounter (Signed)
I spoke to the patient and she is aware of her MRI results.  She has been scheduled for an appt, with Dr. Terrace Arabia, on 03/31/2018, to further review the results.

## 2018-03-30 ENCOUNTER — Ambulatory Visit (INDEPENDENT_AMBULATORY_CARE_PROVIDER_SITE_OTHER): Payer: BLUE CROSS/BLUE SHIELD

## 2018-03-30 DIAGNOSIS — Z3042 Encounter for surveillance of injectable contraceptive: Secondary | ICD-10-CM | POA: Diagnosis not present

## 2018-03-30 MED ORDER — MEDROXYPROGESTERONE ACETATE 150 MG/ML IM SUSY
150.0000 mg | PREFILLED_SYRINGE | Freq: Once | INTRAMUSCULAR | Status: AC
  Administered 2018-03-30: 150 mg via INTRAMUSCULAR

## 2018-03-30 NOTE — Progress Notes (Signed)
   Patient in to nurse clinic within dates for Depo-Provera injection. Injection given LUOQ. Next injection due 06/16/18-06/30/18.  Patient informed to make contraceptive management appointment with PCP before next Depo dates or during next Depo dates. Verbalized understanding. Reminder card given. Ples Specter, RN [facility] RN)

## 2018-03-31 ENCOUNTER — Encounter: Payer: Self-pay | Admitting: Neurology

## 2018-03-31 ENCOUNTER — Ambulatory Visit (INDEPENDENT_AMBULATORY_CARE_PROVIDER_SITE_OTHER): Payer: BLUE CROSS/BLUE SHIELD | Admitting: Neurology

## 2018-03-31 VITALS — BP 130/82 | HR 84 | Ht 61.0 in | Wt 215.5 lb

## 2018-03-31 DIAGNOSIS — G35 Multiple sclerosis: Secondary | ICD-10-CM | POA: Diagnosis not present

## 2018-03-31 DIAGNOSIS — G43709 Chronic migraine without aura, not intractable, without status migrainosus: Secondary | ICD-10-CM

## 2018-03-31 DIAGNOSIS — G35A Relapsing-remitting multiple sclerosis: Secondary | ICD-10-CM | POA: Insufficient documentation

## 2018-03-31 DIAGNOSIS — IMO0002 Reserved for concepts with insufficient information to code with codable children: Secondary | ICD-10-CM

## 2018-03-31 NOTE — Patient Instructions (Signed)
Injectable  Plegridy (peginterferon beta-1a) Rebif (interferon beta-1a)  Pills  Aubagio (teriflunomide) Gilenya (fingolimod) Mavenclad (cladribine) Mayzent (siponimod) Tecfidera (dimethyl fumarate)

## 2018-03-31 NOTE — Progress Notes (Signed)
PATIENT: Emily Hicks DOB: 01-23-1986  Chief Complaint  Patient presents with  . Migraines/Paresthesia    She is here to review her cervical and brain MRI results.     HISTORICAL  Emily Hicks is a 33 year old female, seen in request by her primary care physician Dr. Artist Pais, Sharene Skeans, for evaluation of left-sided numbness, migraine headaches, initial evaluation was on August 26, 2017.  She noticed subacute onset of left anterior thigh numbness in 2011, later numbness spreading to involving her whole left side, includes left arm, leg, trunk, left face, initially, she also noticed intermittent left hand and left foot posturing, spasm, lasting 10-15 minutes, without associated loss of consciousness, mild clumsiness on her left side, she noticed that when she dances zumba.  Shortly after the initial symptoms, she was pregnant, we have reviewed MRI of the brain with contrast in 2011, there was few scattered periventricular oval-shaped lesions, does raise the possibility of multiple sclerosis versus small vessel disease.  During pregnancy her symptoms has much improved, she no longer has intermittent left hand feet posturing, but continue have persistent mild left-sided paresthesia, in recent few months, she seems to know is more dense numbness at her left anterior thigh and, and the left side of her body. She denies visual loss,  She also reported a history of headaches in her teens, triggered by stress, sleep deprivation, bilateral frontal pressure headaches, with light noise sensitivity sometimes.  MRI of the brain in September 2016, and compared to previous scan in 2011, At least 6 small round and ovoid periventricular and subcortical non-specific T2 hyperintensities noted. No abnormal lesions are seen on post contrast views. These findings are non-specific and considerations include autoimmune, inflammatory, post-infectious, microvascular ischemic or migraine associated etiologies.  Compared to MRI on 08/10/09, there is 1 new lesion in the left posterior temporal region and 2 new punctate lesions in the left frontal lobe. Other lesions appear unchanged.   Reviewed laboratory evaluation, positive ANA, SSA, normal TSH, Lyme titer, CBC, CMP, RPR, HIV, protein electrophoresis.  UPDATE August 27 2017: Last office visit was on November 23, 2014, she continue complains of intermittent left-sided numbness, more intense almost persistent left anterior lateral thigh paresthesia, sometimes spreading to right leg, also complains of frequent migraine headaches, she is referred to see rheumatologist for her abnormal laboratory evaluations, positive ANA antibodies.  She also complains of episodes of left finger and toes paresthesia, lock-up, lasting for 15 seconds, bilateral feet goes to sleep,  She has tried gabapentin previously without significant benefit.  UPDATE September 25 2017: She return for electrodiagnostic study today, which was normal, in specific, there was no left cervical radiculopathy, left lumbosacral radiculopathy,  She continue complains constant left arm, hands, lower extremity paresthesia, Cymbalta 60 mg every day has caused upset stomach  Laboratory evaluation in May 2019 showed normal negative CMP, CBC, TSH, low vitamin D of 17  UPDATE Mar 11 2018: She continues to have constant numbness at the left side of her face, body, limbs, frequent needle pricking sensation, recent onset intermittent right hand paresthesia, sometimes right foot paresthesia, she denies gait abnormality. We again personally reviewed MRI of the brain in 2016, scattered supratentorium T2/flair hyperintensity, compared to MRI in 2011, 1 new lesion in the left posterior temporal region,  I have ordered MRI of brain and cervical spine in 2019, but she did not have it scheduled.  Previous laboratory evaluations showed positive ANA, SSA, she does has dry eyes contributed to her contact  lens, vitamin D  deficiency 7, she only takes it intermittently  UPDATE Mar 31 2018: She continue have almost constant left arm, trunk, leg paresthesia, sometimes extending to left jaw  We personally reviewed MRIs on May 24, 2018. MRI of brain: Few scattered periventricular subcortical oval-shaped lesion, no change compared to previous scan, MRI of cervical spine, intrinsic T2 hyperintensity spinal cord lesion at left C4, C5, right C6 and 7, wrist the possibility of demyelinating disease.   REVIEW OF SYSTEMS: Full 14 system review of systems performed and notable only for as above All rest review of the system were negative. ALLERGIES: No Known Allergies  HOME MEDICATIONS: Current Outpatient Medications  Medication Sig Dispense Refill  . ALPRAZolam (XANAX) 1 MG tablet Take 1-2 tablets thirty minutes prior to MRI.  May take one additional tablet before entering scanner, if needed.  MUST HAVE DRIVER. 3 tablet 0  . medroxyPROGESTERone (DEPO-PROVERA) 150 MG/ML injection Inject 1 mL (150 mg total) into the muscle every 3 (three) months. 1 mL 11   No current facility-administered medications for this visit.    Facility-Administered Medications Ordered in Other Visits  Medication Dose Route Frequency Provider Last Rate Last Dose  . gadopentetate dimeglumine (MAGNEVIST) injection 20 mL  20 mL Intravenous Once PRN Levert Feinstein, MD        PAST MEDICAL HISTORY: Past Medical History:  Diagnosis Date  . Gallstones   . GERD (gastroesophageal reflux disease)    only prn OTC occasionally  . Headache   . Numbness and tingling of left leg   . Numbness and tingling of right leg     PAST SURGICAL HISTORY: Past Surgical History:  Procedure Laterality Date  . CHOLECYSTECTOMY N/A 12/28/2013   Procedure: LAPAROSCOPIC CHOLECYSTECTOMY;  Surgeon: Claud Kelp, MD;  Location: [facility];  Service: General;  Laterality: N/A;    FAMILY HISTORY: Family History  Problem Relation Age of Onset  . Hypertension Father     . Hypertension Mother   . Hypertension Sister   . Diabetes Maternal Grandmother   . Diabetes Maternal Grandfather     SOCIAL HISTORY: Social History   Socioeconomic History  . Marital status: Single    Spouse name: Not on file  . Number of children: 1  . Years of education: 66  . Highest education level: Bachelor's degree (e.g., BA, AB, BS)  Occupational History  . Occupation: Audiological scientist  Social Needs  . Financial resource strain: Not on file  . Food insecurity:    Worry: Not on file    Inability: Not on file  . Transportation needs:    Medical: Not on file    Non-medical: Not on file  Tobacco Use  . Smoking status: Never Smoker  . Smokeless tobacco: Never Used  Substance and Sexual Activity  . Alcohol use: No  . Drug use: No  . Sexual activity: Not Currently  Lifestyle  . Physical activity:    Days per week: Not on file    Minutes per session: Not on file  . Stress: Not on file  Relationships  . Social connections:    Talks on phone: Not on file    Gets together: Not on file    Attends religious service: Not on file    Active member of club or organization: Not on file    Attends meetings of clubs or organizations: Not on file    Relationship status: Not on file  . Intimate partner violence:    Fear of current or ex  partner: Not on file    Emotionally abused: Not on file    Physically abused: Not on file    Forced sexual activity: Not on file  Other Topics Concern  . Not on file  Social History Narrative   Lives at home with husband and son.   Right-handed.   No caffeine use.     PHYSICAL EXAM   Vitals:   03/31/18 1436  BP: 130/82  Pulse: 84  Weight: 215 lb 8 oz (97.8 kg)  Height: 5\' 1"  (1.549 m)    Not recorded      Body mass index is 40.72 kg/m.  PHYSICAL EXAMNIATION:  Gen: NAD, conversant, well nourised, obese, well groomed                     Cardiovascular: Regular rate rhythm, no peripheral edema, warm, nontender. Eyes:  Conjunctivae clear without exudates or hemorrhage Neck: Supple, no carotid bruits. Pulmonary: Clear to auscultation bilaterally   NEUROLOGICAL EXAM:  MENTAL STATUS: Speech:    Speech is normal; fluent and spontaneous with normal comprehension.  Cognition:     Orientation to time, place and person     Normal recent and remote memory     Normal Attention span and concentration     Normal Language, naming, repeating,spontaneous speech     Fund of knowledge   CRANIAL NERVES: CN II: Visual fields are full to confrontation. Fundoscopic exam is normal with sharp discs and no vascular changes. Pupils are round equal and briskly reactive to light. CN III, IV, VI: extraocular movement are normal. No ptosis. CN V: Facial sensation is intact to pinprick in all 3 divisions bilaterally. Corneal responses are intact.  CN VII: Face is symmetric with normal eye closure and smile. CN VIII: Hearing is normal to rubbing fingers CN IX, X: Palate elevates symmetrically. Phonation is normal. CN XI: Head turning and shoulder shrug are intact CN XII: Tongue is midline with normal movements and no atrophy.  MOTOR: There is no pronator drift of out-stretched arms. Muscle bulk and tone are normal. Muscle strength is normal.  REFLEXES: Reflexes are 2+ and symmetric at the biceps, triceps, knees, and ankles. Plantar responses are flexor.  SENSORY: Intact to light touch, pinprick, positional sensation and vibratory sensation are intact in fingers and toes.  COORDINATION: Rapid alternating movements and fine finger movements are intact. There is no dysmetria on finger-to-nose and heel-knee-shin.    GAIT/STANCE: Posture is normal. Gait is steady with normal steps, base, arm swing, and turning. Heel and toe walking are normal. Tandem gait is normal.  Romberg is absent.   DIAGNOSTIC DATA (LABS, IMAGING, TESTING) - I reviewed patient records, labs, notes, testing and imaging myself where  available.   ASSESSMENT AND PLAN  Emily Hicks is a 33 y.o. female   Left-sided paresthesia, left side persistent numbness,  Mild abnormality on MRI of the brain, but the intrinsic cord lesion especially left C4, C5 lesion will explain her complaints of left side paresthesia, most likely Relapsing Remitting Multiple Sclerosis   Complete evaluation with extensive laboratory evaluation to rule out inflammatory, infectious etiology  Lumbar puncture  I also introduced her the website of National multiple sclerosis Society, offered information about the potential p.o., injectable treatment, will have further discussion at follow-up visit      Levert Feinstein, M.D. Ph.D.  [facility] [address] [place], [place] [address] Ph: [phone] Fax: [phone]  CC:  Leland Her, DO

## 2018-04-02 LAB — CBC WITH DIFFERENTIAL/PLATELET
BASOS ABS: 0 10*3/uL (ref 0.0–0.2)
Basos: 1 %
EOS (ABSOLUTE): 0 10*3/uL (ref 0.0–0.4)
Eos: 1 %
HEMOGLOBIN: 13.9 g/dL (ref 11.1–15.9)
Hematocrit: 40.9 % (ref 34.0–46.6)
Immature Grans (Abs): 0 10*3/uL (ref 0.0–0.1)
Immature Granulocytes: 0 %
Lymphocytes Absolute: 1 10*3/uL (ref 0.7–3.1)
Lymphs: 31 %
MCH: 27.4 pg (ref 26.6–33.0)
MCHC: 34 g/dL (ref 31.5–35.7)
MCV: 81 fL (ref 79–97)
Monocytes Absolute: 0.3 10*3/uL (ref 0.1–0.9)
Monocytes: 7 %
NEUTROS ABS: 2 10*3/uL (ref 1.4–7.0)
Neutrophils: 60 %
Platelets: 266 10*3/uL (ref 150–450)
RBC: 5.07 x10E6/uL (ref 3.77–5.28)
RDW: 13 % (ref 11.7–15.4)
WBC: 3.4 10*3/uL (ref 3.4–10.8)

## 2018-04-02 LAB — COMPREHENSIVE METABOLIC PANEL
ALBUMIN: 4.2 g/dL (ref 3.8–4.8)
ALT: 44 IU/L — ABNORMAL HIGH (ref 0–32)
AST: 25 IU/L (ref 0–40)
Albumin/Globulin Ratio: 1.4 (ref 1.2–2.2)
Alkaline Phosphatase: 97 IU/L (ref 39–117)
BUN/Creatinine Ratio: 9 (ref 9–23)
BUN: 5 mg/dL — AB (ref 6–20)
Bilirubin Total: 0.6 mg/dL (ref 0.0–1.2)
CO2: 23 mmol/L (ref 20–29)
Calcium: 9.5 mg/dL (ref 8.7–10.2)
Chloride: 108 mmol/L — ABNORMAL HIGH (ref 96–106)
Creatinine, Ser: 0.54 mg/dL — ABNORMAL LOW (ref 0.57–1.00)
GFR calc non Af Amer: 126 mL/min/{1.73_m2} (ref >59)
GFR, EST AFRICAN AMERICAN: 145 mL/min/{1.73_m2} (ref >59)
Globulin, Total: 2.9 g/dL (ref 1.5–4.5)
Glucose: 96 mg/dL (ref 65–99)
Potassium: 3.7 mmol/L (ref 3.5–5.2)
Sodium: 144 mmol/L (ref 134–144)
Total Protein: 7.1 g/dL (ref 6.0–8.5)

## 2018-04-02 LAB — PROTEIN ELECTROPHORESIS
A/G Ratio: 1.1 (ref 0.7–1.7)
ALBUMIN ELP: 3.7 g/dL (ref 2.9–4.4)
Alpha 1: 0.2 g/dL (ref 0.0–0.4)
Alpha 2: 0.8 g/dL (ref 0.4–1.0)
Beta: 1 g/dL (ref 0.7–1.3)
GLOBULIN, TOTAL: 3.4 g/dL (ref 2.2–3.9)
Gamma Globulin: 1.5 g/dL (ref 0.4–1.8)

## 2018-04-02 LAB — ANA W/REFLEX IF POSITIVE
Anti JO-1: <0.2 AI (ref 0.0–0.9)
Anti Nuclear Antibody(ANA): POSITIVE — AB
Centromere Ab Screen: <0.2 AI (ref 0.0–0.9)
Chromatin Ab SerPl-aCnc: 4.7 AI — ABNORMAL HIGH (ref 0.0–0.9)
ENA RNP Ab: 4.3 AI — ABNORMAL HIGH (ref 0.0–0.9)
ENA SM Ab Ser-aCnc: 6.2 AI — ABNORMAL HIGH (ref 0.0–0.9)
ENA SSA (RO) Ab: 4 AI — ABNORMAL HIGH (ref 0.0–0.9)
ENA SSB (LA) Ab: <0.2 AI (ref 0.0–0.9)
Scleroderma (Scl-70) (ENA) Antibody, IgG: <0.2 AI (ref 0.0–0.9)
dsDNA Ab: 46 IU/mL — ABNORMAL HIGH (ref 0–9)

## 2018-04-02 LAB — TSH: TSH: 0.499 u[IU]/mL (ref 0.450–4.500)

## 2018-04-02 LAB — VITAMIN B12: VITAMIN B 12: 415 pg/mL (ref 232–1245)

## 2018-04-02 LAB — SEDIMENTATION RATE: Sed Rate: 27 mm/hr (ref 0–32)

## 2018-04-02 LAB — HIV ANTIBODY (ROUTINE TESTING W REFLEX): HIV Screen 4th Generation wRfx: NONREACTIVE

## 2018-04-02 LAB — VITAMIN D 25 HYDROXY (VIT D DEFICIENCY, FRACTURES): Vit D, 25-Hydroxy: 15.3 ng/mL — ABNORMAL LOW (ref 30.0–100.0)

## 2018-04-02 LAB — CK: Total CK: 80 U/L (ref 24–173)

## 2018-04-02 LAB — RPR: RPR Ser Ql: NONREACTIVE

## 2018-04-02 LAB — COPPER, SERUM: Copper: 121 ug/dL (ref 72–166)

## 2018-04-02 LAB — VARICELLA ZOSTER ANTIBODY, IGG: Varicella zoster IgG: 2946 index (ref >165)

## 2018-04-02 LAB — FOLATE: Folate: 8.3 ng/mL (ref >3.0)

## 2018-04-02 LAB — HEPATITIS B SURFACE ANTIBODY,QUALITATIVE: Hep B Surface Ab, Qual: NONREACTIVE

## 2018-04-02 LAB — C-REACTIVE PROTEIN: CRP: 2 mg/L (ref 0–10)

## 2018-04-02 LAB — HEPATITIS B SURFACE ANTIGEN: Hepatitis B Surface Ag: NEGATIVE

## 2018-04-02 LAB — HEPATITIS B CORE ANTIBODY, TOTAL: Hep B Core Total Ab: NEGATIVE

## 2018-04-05 ENCOUNTER — Telehealth: Payer: Self-pay | Admitting: Neurology

## 2018-04-05 ENCOUNTER — Telehealth: Payer: Self-pay | Admitting: *Deleted

## 2018-04-05 NOTE — Telephone Encounter (Signed)
Spoke to patient to notify her of the lab results below.  She will start taking a vitamin D3 supplement of 2000 units per day.  Her LP order was sent to [facility] today and she is aware to expect a call for scheduling.  She was also provided with their phone number.

## 2018-04-05 NOTE — Telephone Encounter (Signed)
Labs collected on 03/31/2018:  JCV ab 3.28 positive

## 2018-04-05 NOTE — Telephone Encounter (Signed)
Please call patient, extensive laboratory evaluation showed low vitamin D 15, she should continue vitamin D3 supplement 2000 units daily  Also positive ANA, double-stranded DNA, ENA antibodies, SSA, chromatin antibodies, above findings suggestive of underlying inflammatory process, will repeat laboratory evaluation later.  She should continue lumbar puncture as previously scheduled (make sure she is on schedule for lumbar puncture).  Rest of the laboratory evaluation showed no significant abnormality

## 2018-04-16 ENCOUNTER — Ambulatory Visit
Admission: RE | Admit: 2018-04-16 | Discharge: 2018-04-16 | Disposition: A | Discharge status: Home / Self Care | Payer: BLUE CROSS/BLUE SHIELD | Source: Ambulatory Visit | Attending: Neurology | Admitting: Neurology

## 2018-04-16 ENCOUNTER — Other Ambulatory Visit: Payer: Self-pay

## 2018-04-16 VITALS — BP 151/96 | HR 77

## 2018-04-16 DIAGNOSIS — R531 Weakness: Secondary | ICD-10-CM | POA: Diagnosis not present

## 2018-04-16 DIAGNOSIS — G35 Multiple sclerosis: Secondary | ICD-10-CM | POA: Diagnosis not present

## 2018-04-16 DIAGNOSIS — R202 Paresthesia of skin: Secondary | ICD-10-CM

## 2018-04-16 DIAGNOSIS — R200 Anesthesia of skin: Secondary | ICD-10-CM

## 2018-04-16 DIAGNOSIS — R252 Cramp and spasm: Secondary | ICD-10-CM

## 2018-04-16 DIAGNOSIS — G43709 Chronic migraine without aura, not intractable, without status migrainosus: Secondary | ICD-10-CM | POA: Diagnosis not present

## 2018-04-16 DIAGNOSIS — IMO0002 Reserved for concepts with insufficient information to code with codable children: Secondary | ICD-10-CM

## 2018-04-16 NOTE — Discharge Instructions (Signed)

## 2018-04-16 NOTE — Progress Notes (Signed)
Blood obtained from pt's R AC for LP labs. Pt tolerated procedure well. Site is unremarkable.  

## 2018-04-29 ENCOUNTER — Ambulatory Visit: Payer: Self-pay | Admitting: Neurology

## 2018-05-03 ENCOUNTER — Ambulatory Visit (INDEPENDENT_AMBULATORY_CARE_PROVIDER_SITE_OTHER): Payer: BLUE CROSS/BLUE SHIELD | Admitting: Neurology

## 2018-05-03 ENCOUNTER — Encounter: Payer: Self-pay | Admitting: Neurology

## 2018-05-03 ENCOUNTER — Other Ambulatory Visit: Payer: Self-pay

## 2018-05-03 ENCOUNTER — Telehealth: Payer: Self-pay | Admitting: Neurology

## 2018-05-03 DIAGNOSIS — G35 Multiple sclerosis: Secondary | ICD-10-CM | POA: Diagnosis not present

## 2018-05-03 NOTE — Progress Notes (Signed)
PATIENT: Emily Hicks DOB: [birth year]  No chief complaint on file.    HISTORICAL  Emily Hicks is a 33 year old female, seen in request by her primary care physician Dr. Shawna Hicks, Emily Hicks, for evaluation of left-sided numbness, migraine headaches, initial evaluation was on August 26, 2017.  She noticed subacute onset of left anterior thigh numbness in 2011, later numbness spreading to involving her whole left side, includes left arm, leg, trunk, left face, initially, she also noticed intermittent left hand and left foot posturing, spasm, lasting 10-15 minutes, without associated loss of consciousness, mild clumsiness on her left side, she noticed that when she dances zumba.  Shortly after the initial symptoms, she was pregnant, we have reviewed MRI of the brain with contrast in 2011, there was few scattered periventricular oval-shaped lesions, does raise the possibility of multiple sclerosis versus small vessel disease.  During pregnancy her symptoms has much improved, she no longer has intermittent left hand feet posturing, but continue have persistent mild left-sided paresthesia, in recent few months, she seems to know is more dense numbness at her left anterior thigh and, and the left side of her body. She denies visual loss,  She also reported a history of headaches in her teens, triggered by stress, sleep deprivation, bilateral frontal pressure headaches, with light noise sensitivity sometimes.  MRI of the brain in September 2016, and compared to previous scan in 2011, At least 6 small round and ovoid periventricular and subcortical non-specific T2 hyperintensities noted. No abnormal lesions are seen on post contrast views. These findings are non-specific and considerations include autoimmune, inflammatory, post-infectious, microvascular ischemic or migraine associated etiologies. Compared to MRI on 08/10/09, there is 1 new lesion in the left posterior temporal region and 2 new punctate  lesions in the left frontal lobe. Other lesions appear unchanged.   Reviewed laboratory evaluation, positive ANA, SSA, normal TSH, Lyme titer, CBC, CMP, RPR, HIV, protein electrophoresis.  UPDATE August 27 2017: Last office visit was on November 23, 2014, she continue complains of intermittent left-sided numbness, more intense almost persistent left anterior lateral thigh paresthesia, sometimes spreading to right leg, also complains of frequent migraine headaches, she is referred to see rheumatologist for her abnormal laboratory evaluations, positive ANA antibodies.  She also complains of episodes of left finger and toes paresthesia, lock-up, lasting for 15 seconds, bilateral feet goes to sleep,  She has tried gabapentin previously without significant benefit.  UPDATE September 25 2017: She return for electrodiagnostic study today, which was normal, in specific, there was no left cervical radiculopathy, left lumbosacral radiculopathy,  She continue complains constant left arm, hands, lower extremity paresthesia, Cymbalta 60 mg every day has caused upset stomach  Laboratory evaluation in May 2019 showed normal negative CMP, CBC, TSH, low vitamin D of 17  UPDATE Mar 11 2018: She continues to have constant numbness at the left side of her face, body, limbs, frequent needle pricking sensation, recent onset intermittent right hand paresthesia, sometimes right foot paresthesia, she denies gait abnormality. We again personally reviewed MRI of the brain in 2016, scattered supratentorium T2/flair hyperintensity, compared to MRI in 2011, 1 new lesion in the left posterior temporal region,  I have ordered MRI of brain and cervical spine in 2019, but she did not have it scheduled.  Previous laboratory evaluations showed positive ANA, SSA, she does has dry eyes contributed to her contact lens, vitamin D deficiency 17, she only takes it intermittently  UPDATE Mar 31 2018: She continue have  almost constant  left arm, trunk, leg paresthesia, sometimes extending to left jaw  We personally reviewed MRIs on May 24, 2018. MRI of brain: Few scattered periventricular subcortical oval-shaped lesion, no change compared to previous scan, MRI of cervical spine, intrinsic T2 hyperintensity spinal cord lesion at left C4, C5, right C6 and 7, wrist the possibility of demyelinating disease.  Virtual Visit via Video  I connected with Emily Hicks on 05/03/18 at  by Video and verified that I am speaking with the correct person using two identifiers.   I discussed the limitations, risks, security and privacy concerns of performing an evaluation and management service by Video and the availability of in person appointments. I also discussed with the patient that there may be a patient responsible charge related to this service. The patient expressed understanding and agreed to proceed.   History of Present Illness: She is following up on her treatment option for her relapsing remitting multiple sclerosis  The diagnosis is confirmed by abnormal cervical spine, MRI of the brain, spinal fluid testing showed more than 5 oligoclonal banding She continues to complaints left facial, upper and lower extremity paresthesia, but does not want any neuropathic pain medications,  Observations/Objective: I have reviewed problem lists, medications, allergies. I personally reviewed MRI of cervical spine in March 24, 2018, intrinsic T2 hyperintensity spinal cord lesion at left C4, C5, right C6 and 7,  MRI of brain few scattered periventricular oval-shaped lesion  Spinal fluid testing April 16, 2018, more than 5 oligoclonal band, WBC 0, RBC 0  Laboratory evaluation in February 2020, JC virus titer is 3.28,  normal negative Lyme titer, vitamin J68, RPR, HIV, folic acid, C-reactive protein, TSH, ESR, CPK, protein electrophoresis, ANA, copper, vitamin D, CBC, CMP, hepatitis B panel, VZV,  Assessment and Plan: Relapsing  remitting multiple sclerosis  With evidence of cervical cord involvement  CSF oligoclonal banding is more than 5,  We have discussed treatment option, she is not considering having children now, would prefer p.o. treatment, I have suggested Tecfidera, Cleora, Mayzent she will look over the medications at [facility] website, will make a decision after reviewing Rosana Hoes  Follow Up Instructions:  2 to 3 months    I discussed the assessment and treatment plan with the patient. The patient was provided an opportunity to ask questions and all were answered. The patient agreed with the plan and demonstrated an understanding of the instructions.   The patient was advised to call back or seek an in-person evaluation if the symptoms worsen or if the condition fails to improve as anticipated.  I provided 30 minutes of non-face-to-face time during this encounter.   Marcial Pacas, MD

## 2018-05-03 NOTE — Telephone Encounter (Signed)
I have called her, CSF has more than 5 oligoclonal band, confirmed the diagnosis of MS along with her abnormal MRI brain and cervical spine.   She would like to have virtual meeting today at 3pm to discuss about treatment options, she also complains of new onset hand paresthesia.

## 2018-05-03 NOTE — Telephone Encounter (Signed)
I spoke to the patient and she has been added to Dr. Zannie Cove schedule today at 3pm.

## 2018-05-11 ENCOUNTER — Other Ambulatory Visit (INDEPENDENT_AMBULATORY_CARE_PROVIDER_SITE_OTHER): Payer: Self-pay

## 2018-05-11 ENCOUNTER — Telehealth: Payer: Self-pay | Admitting: Neurology

## 2018-05-11 ENCOUNTER — Other Ambulatory Visit: Payer: Self-pay

## 2018-05-11 DIAGNOSIS — G35 Multiple sclerosis: Secondary | ICD-10-CM

## 2018-05-11 DIAGNOSIS — Z0289 Encounter for other administrative examinations: Secondary | ICD-10-CM

## 2018-05-11 NOTE — Telephone Encounter (Signed)
I have called patient, discussed MS treatment options, will start on Aubagio, potential side effects were explained, emphasize the potential tetragenic side effect, she should not be pregnant.  Lab order placed, CMP, CBC, vzv antibody, quantiferon-TB gold antibody.

## 2018-05-11 NOTE — Telephone Encounter (Signed)
Per vo by Dr. Terrace Arabia, she will start the patient on Aubagio 7mg , one tablet daily.  The start form has been completed and signed by MD.  The patient came to the office today for labs and signed her portion of the form.  It has been faxed and confirmed to [employer] at [phone].  A prior authorization for the medication has been completed through [email or web address] (key: [id number]).  The medication has been approved through 05/09/2021.  She has coverage with BCBS of [place] [phone]).

## 2018-05-12 NOTE — Telephone Encounter (Signed)
Received fax from [employer].  Asking for PA.  Noted that PA done and approved.  I faxed approval phone message, noted CMM approval to then with fax confirmation [phone].

## 2018-05-13 ENCOUNTER — Telehealth: Payer: Self-pay | Admitting: Neurology

## 2018-05-13 ENCOUNTER — Other Ambulatory Visit: Payer: Self-pay | Admitting: Neurology

## 2018-05-13 LAB — QUANTIFERON-TB GOLD PLUS
QuantiFERON Mitogen Value: >10 IU/mL
QuantiFERON Nil Value: 0.03 IU/mL
QuantiFERON TB1 Ag Value: 0.02 IU/mL
QuantiFERON TB2 Ag Value: 0.02 IU/mL
QuantiFERON-TB Gold Plus: NEGATIVE

## 2018-05-13 LAB — COMPREHENSIVE METABOLIC PANEL
ALT: 281 IU/L — ABNORMAL HIGH (ref 0–32)
AST: 112 IU/L — ABNORMAL HIGH (ref 0–40)
Albumin/Globulin Ratio: 1.3 (ref 1.2–2.2)
Albumin: 3.8 g/dL (ref 3.8–4.8)
Alkaline Phosphatase: 86 IU/L (ref 39–117)
BUN/Creatinine Ratio: 11 (ref 9–23)
BUN: 7 mg/dL (ref 6–20)
Bilirubin Total: 0.5 mg/dL (ref 0.0–1.2)
CO2: 22 mmol/L (ref 20–29)
Calcium: 9.4 mg/dL (ref 8.7–10.2)
Chloride: 107 mmol/L — ABNORMAL HIGH (ref 96–106)
Creatinine, Ser: 0.64 mg/dL (ref 0.57–1.00)
GFR calc Af Amer: 138 mL/min/{1.73_m2} (ref >59)
GFR calc non Af Amer: 119 mL/min/{1.73_m2} (ref >59)
Globulin, Total: 2.9 g/dL (ref 1.5–4.5)
Glucose: 109 mg/dL — ABNORMAL HIGH (ref 65–99)
Potassium: 3.9 mmol/L (ref 3.5–5.2)
Sodium: 142 mmol/L (ref 134–144)
Total Protein: 6.7 g/dL (ref 6.0–8.5)

## 2018-05-13 LAB — CBC WITH DIFFERENTIAL
Basophils Absolute: 0 10*3/uL (ref 0.0–0.2)
Basos: 0 %
EOS (ABSOLUTE): 0 10*3/uL (ref 0.0–0.4)
Eos: 0 %
Hematocrit: 35.4 % (ref 34.0–46.6)
Hemoglobin: 12 g/dL (ref 11.1–15.9)
Immature Grans (Abs): 0 10*3/uL (ref 0.0–0.1)
Immature Granulocytes: 0 %
Lymphocytes Absolute: 0.9 10*3/uL (ref 0.7–3.1)
Lymphs: 38 %
MCH: 27.5 pg (ref 26.6–33.0)
MCHC: 33.9 g/dL (ref 31.5–35.7)
MCV: 81 fL (ref 79–97)
Monocytes Absolute: 0.2 10*3/uL (ref 0.1–0.9)
Monocytes: 9 %
Neutrophils Absolute: 1.2 10*3/uL — ABNORMAL LOW (ref 1.4–7.0)
Neutrophils: 53 %
RBC: 4.37 x10E6/uL (ref 3.77–5.28)
RDW: 13.3 % (ref 11.7–15.4)
WBC: 2.4 10*3/uL — CL (ref 3.4–10.8)

## 2018-05-13 LAB — VARICELLA ZOSTER ANTIBODY, IGG: Varicella zoster IgG: 2353 index (ref >165)

## 2018-05-13 MED ORDER — AUBAGIO 7 MG PO TABS: 7.0000 mg | ORAL_TABLET | Freq: Every day | ORAL | 5 refills | Status: DC

## 2018-05-13 NOTE — Telephone Encounter (Signed)
I called the patient.  The patient is had dramatic change in liver enzymes from blood work done in February 2020.  The patient is not to start the Aubagio, she has not yet taken any medication.  She has had a hepatitis B screen, she likely will need hepatitis C, I will send blood work results to her primary care physician, she will need further work-up.

## 2018-05-14 LAB — MULTIPLE SCLEROSIS PANEL 2
Albumin Serum: 4 g/dL (ref 3.5–5.2)
Albumin, CSF: 9.8 mg/dL (ref 8.0–42.0)
CNS-IgG Synthesis Rate: 14.2 mg/24 h — ABNORMAL HIGH (ref <=3.3)
IGG-INDEX: 1.57 — AB (ref <0.66)
IgG (Immunoglobin G), Serum: 1460 mg/dL (ref 600–1640)
IgG Total CSF: 5.6 mg/dL (ref 0.8–7.7)
Myelin Basic Protein: <2 mcg/L (ref 2.0–4.0)

## 2018-05-14 LAB — CSF CELL COUNT WITH DIFFERENTIAL
RBC Count, CSF: 0 cells/uL (ref 0–10)
WBC, CSF: 0 cells/uL (ref 0–5)

## 2018-05-14 LAB — GRAM STAIN
MICRO NUMBER:: 316932
SPECIMEN QUALITY:: ADEQUATE

## 2018-05-14 LAB — FUNGUS CULTURE W SMEAR
MICRO NUMBER:: 316933
SMEAR:: NONE SEEN
SPECIMEN QUALITY:: ADEQUATE

## 2018-05-14 LAB — VDRL, CSF: VDRL Quant, CSF: NONREACTIVE

## 2018-05-14 LAB — GLUCOSE, CSF: Glucose, CSF: 50 mg/dL (ref 40–80)

## 2018-05-14 LAB — PROTEIN, CSF: Total Protein, CSF: 25 mg/dL (ref 15–45)

## 2018-05-18 ENCOUNTER — Telehealth (INDEPENDENT_AMBULATORY_CARE_PROVIDER_SITE_OTHER): Payer: BLUE CROSS/BLUE SHIELD | Admitting: Family Medicine

## 2018-05-18 DIAGNOSIS — R7989 Other specified abnormal findings of blood chemistry: Secondary | ICD-10-CM | POA: Insufficient documentation

## 2018-05-18 DIAGNOSIS — R740 Nonspecific elevation of levels of transaminase and lactic acid dehydrogenase [LDH]: Secondary | ICD-10-CM

## 2018-05-18 DIAGNOSIS — R945 Abnormal results of liver function studies: Secondary | ICD-10-CM

## 2018-05-18 DIAGNOSIS — R7401 Elevation of levels of liver transaminase levels: Secondary | ICD-10-CM

## 2018-05-18 NOTE — Telephone Encounter (Signed)
Reviewed chart, her pcp Dr. Artist Pais initiate evaluation for abnormal LFTs,  Will hold off Aubagio until LFTs return to normal.

## 2018-05-18 NOTE — Assessment & Plan Note (Signed)
Patient had acute elevation of her LFTs that were checked as baseline prior to starting Aubagio for MS.  Her LFTs prior to this were checked in February and were normal at that time.  Her AST is 112, ALT 281.  Normal T bili and alk phos.  Offered in person visit for possibility of doing physical exam.  Patient prefers this telemedicine visit with lab based on her schedule.  She has no risk factors for medication induced and also has no history of alcohol use.  She does have obesity which could suggest that she is at high risk for nonalcoholic fatty liver disease.  Discussed starting with hepatitis labs, TSH, recheck of LFTs as well as iron studies although her hemoglobin was normal at most recent lab check

## 2018-05-18 NOTE — Telephone Encounter (Signed)
[place] Telemedicine Visit  Patient consented to have virtual visit. Method of visit: Telephone  Encounter participants: Patient: Emily Hicks - located at home Provider: Bufford Lope - located at [facility] Others (if applicable): none  Chief Complaint: abnormal labs  HPI: Patient states that she was told her liver enzymes had increased since they were last checked in February.  She is a bit concerned as she was feeling better about having a diagnosis for her MS symptoms and they have been planning to start medication but this feels like a setback to her. She denies any former or current alcohol use.  She denies current or former recreational drug use.  She is not on any regular medications at home.  She denies any herbal supplements or over-the-counter medication use. She has not noticed any recent illnesses, skin changes, abdominal pain or distention, swelling of her lower extremities, shortness of breath.  Has had some cold intolerance.  No yellowing of her eyes or skin.  No personal history of diabetes.  No workplace risk factors for hepatitis.  No recent travel.   ROS: per HPI  Pertinent PMHx: Multiple sclerosis, obesity.  History of gallstones status post laparoscopy cholecystectomy in 2015.  Exam:  Not applicable  Assessment/Plan:  Elevated LFTs Patient had acute elevation of her LFTs that were checked as baseline prior to starting Aubagio for MS.  Her LFTs prior to this were checked in February and were normal at that time.  Her AST is 112, ALT 281.  Normal T bili and alk phos.  Offered in person visit for possibility of doing physical exam.  Patient prefers this telemedicine visit with lab based on her schedule.  She has no risk factors for medication induced and also has no history of alcohol use.  She does have obesity which could suggest that she is at high risk for nonalcoholic fatty liver disease.  Discussed starting with hepatitis labs, TSH, recheck  of LFTs as well as iron studies although her hemoglobin was normal at most recent lab check    Time spent during visit with patient: 14 minutes  Billing: yes  Bufford Lope, DO PGY-3, [facility] 05/18/2018 9:05 AM

## 2018-05-19 ENCOUNTER — Other Ambulatory Visit: Payer: Self-pay

## 2018-05-19 ENCOUNTER — Other Ambulatory Visit: Payer: BLUE CROSS/BLUE SHIELD

## 2018-05-19 DIAGNOSIS — R945 Abnormal results of liver function studies: Secondary | ICD-10-CM | POA: Diagnosis not present

## 2018-05-19 DIAGNOSIS — R7989 Other specified abnormal findings of blood chemistry: Secondary | ICD-10-CM

## 2018-05-20 LAB — IRON AND TIBC
Iron Saturation: 28 % (ref 15–55)
Iron: 70 ug/dL (ref 27–159)
Total Iron Binding Capacity: 246 ug/dL — ABNORMAL LOW (ref 250–450)
UIBC: 176 ug/dL (ref 131–425)

## 2018-05-22 LAB — COMPREHENSIVE METABOLIC PANEL
ALT: 163 IU/L — ABNORMAL HIGH (ref 0–32)
AST: 65 IU/L — ABNORMAL HIGH (ref 0–40)
Albumin/Globulin Ratio: 1.3 (ref 1.2–2.2)
Albumin: 3.7 g/dL — ABNORMAL LOW (ref 3.8–4.8)
Alkaline Phosphatase: 78 IU/L (ref 39–117)
BUN/Creatinine Ratio: 16 (ref 9–23)
BUN: 10 mg/dL (ref 6–20)
Bilirubin Total: 0.4 mg/dL (ref 0.0–1.2)
CO2: 24 mmol/L (ref 20–29)
Calcium: 8.9 mg/dL (ref 8.7–10.2)
Chloride: 110 mmol/L — ABNORMAL HIGH (ref 96–106)
Creatinine, Ser: 0.64 mg/dL (ref 0.57–1.00)
GFR calc Af Amer: 138 mL/min/{1.73_m2} (ref >59)
GFR calc non Af Amer: 119 mL/min/{1.73_m2} (ref >59)
Globulin, Total: 2.9 g/dL (ref 1.5–4.5)
Glucose: 97 mg/dL (ref 65–99)
Potassium: 3.8 mmol/L (ref 3.5–5.2)
Sodium: 143 mmol/L (ref 134–144)
Total Protein: 6.6 g/dL (ref 6.0–8.5)

## 2018-05-22 LAB — HEPATITIS B CORE ANTIBODY, TOTAL: Hep B Core Total Ab: NEGATIVE

## 2018-05-22 LAB — HEPATITIS B SURFACE ANTIBODY,QUALITATIVE: Hep B Surface Ab, Qual: NONREACTIVE

## 2018-05-22 LAB — TSH: TSH: 0.539 u[IU]/mL (ref 0.450–4.500)

## 2018-05-22 LAB — TRANSFERRIN SATURATION
IRON SATN MFR SERPL: 25 % Saturation
IRON SERPL-MCNC: 69 ug/dL
TRANSFERRIN SERPL-MCNC: 200 mg/dL

## 2018-05-22 LAB — HEPATITIS C ANTIBODY: Hep C Virus Ab: <0.1 s/co ratio (ref 0.0–0.9)

## 2018-05-22 LAB — HEPATITIS B SURFACE ANTIGEN: Hepatitis B Surface Ag: NEGATIVE

## 2018-05-22 LAB — FERRITIN: Ferritin: 273 ng/mL — ABNORMAL HIGH (ref 15–150)

## 2018-05-22 LAB — HEPATITIS A ANTIBODY, TOTAL: hep A Total Ab: NEGATIVE

## 2018-05-24 ENCOUNTER — Telehealth: Payer: Self-pay | Admitting: Neurology

## 2018-05-24 DIAGNOSIS — R7989 Other specified abnormal findings of blood chemistry: Secondary | ICD-10-CM

## 2018-05-24 DIAGNOSIS — R945 Abnormal results of liver function studies: Secondary | ICD-10-CM

## 2018-05-24 NOTE — Telephone Encounter (Signed)
I called pt. Pt is agreeable to coming on 07/05/18 for repeat LFTs. Pt understands that this is just a lab visit. Pt has been added to the lab schedule.

## 2018-05-24 NOTE — Telephone Encounter (Signed)
Please call patient to come to our office for LFTs on July 05 2018, will consider starting Aubagio only if her LFTs recover to normal.

## 2018-06-01 ENCOUNTER — Other Ambulatory Visit: Payer: Self-pay | Admitting: Neurology

## 2018-06-01 MED ORDER — DULOXETINE HCL 60 MG PO CPEP: 60.0000 mg | ORAL_CAPSULE | Freq: Every day | ORAL | 6 refills | Status: DC

## 2018-06-02 ENCOUNTER — Ambulatory Visit: Payer: BLUE CROSS/BLUE SHIELD | Admitting: Neurology

## 2018-06-21 ENCOUNTER — Telehealth: Payer: Self-pay | Admitting: Neurology

## 2018-06-21 NOTE — Telephone Encounter (Signed)
Patient calling requesting if she can be seen face to face with Dr Terrace Arabia. She continues to have a problem functioning. Please call her at [phone]

## 2018-06-21 NOTE — Telephone Encounter (Signed)
Sent to Brittney if pt will need a in office visit.

## 2018-06-21 NOTE — Telephone Encounter (Signed)
I called Emily Hicks that DR. Terrace Arabia recommend scheduling her for in office visit on July 05, 2018. I stated Dr. Terrace Arabia is full on 07/05/2018. I stated that she has a office visit on 07/06/2018 at 0900am check in time at 0830. I explain she will check in outside and will be screen for COVID 19. I advise Emily Hicks to wear gloves and mask. Emily Hicks verbalized understanding,

## 2018-06-21 NOTE — Telephone Encounter (Signed)
Please schedule her on June 1, Monday PM

## 2018-06-25 ENCOUNTER — Telehealth: Payer: Self-pay | Admitting: Neurology

## 2018-06-25 ENCOUNTER — Other Ambulatory Visit: Payer: Self-pay

## 2018-06-25 ENCOUNTER — Encounter (HOSPITAL_COMMUNITY): Payer: Self-pay | Admitting: Emergency Medicine

## 2018-06-25 ENCOUNTER — Emergency Department (HOSPITAL_COMMUNITY)
Admission: EM | Admit: 2018-06-25 | Discharge: 2018-06-25 | Disposition: A | Discharge status: Home / Self Care | Payer: BLUE CROSS/BLUE SHIELD | Attending: Emergency Medicine | Admitting: Emergency Medicine

## 2018-06-25 DIAGNOSIS — Z79899 Other long term (current) drug therapy: Secondary | ICD-10-CM | POA: Diagnosis not present

## 2018-06-25 DIAGNOSIS — M2550 Pain in unspecified joint: Secondary | ICD-10-CM | POA: Diagnosis not present

## 2018-06-25 DIAGNOSIS — G35 Multiple sclerosis: Secondary | ICD-10-CM | POA: Insufficient documentation

## 2018-06-25 MED ORDER — NAPROXEN 500 MG PO TABS: 500.0000 mg | ORAL_TABLET | Freq: Two times a day (BID) | ORAL | 0 refills | Status: DC

## 2018-06-25 MED ORDER — KETOROLAC TROMETHAMINE 15 MG/ML IJ SOLN
30.0000 mg | Freq: Once | INTRAMUSCULAR | Status: AC
  Administered 2018-06-25: 30 mg via INTRAMUSCULAR
  Filled 2018-06-25: qty 2

## 2018-06-25 NOTE — Telephone Encounter (Signed)
Pt states she is in a lot of pain and is having stiffness in her joints arms and legs and her fingers are swelling. Pt would like to know if something can be done for her until her appt in June. Please advise.

## 2018-06-25 NOTE — ED Provider Notes (Signed)
[facility] DEPT Provider Note   CSN: [id number] Arrival date & time: 06/25/18  1232    History   Chief Complaint Chief Complaint  Patient presents with  . Joint Swelling    HPI Emily Hicks is a 33 y.o. female.     33 year old female with prior medical history as detailed below presents for evaluation of joint pain.  Patient reports 2 to 3-week history of multiple achy joints.  She reports that her joints feel stiff.  Primary involved joints appear to be hands and ankles.  She will have worse symptoms in the morning.  She denies associated fever.  She denies any specific injury.  She has taken some ibuprofen at home with minimal improvement in her symptoms.  Of note, patient does have a history of MS.  She is not currently on any medications for same.  Her MS symptoms have included visual changes and paresthesias.  The history is provided by the patient and medical records.  Illness  Location:  Joint pain and stiffness  Severity:  Mild Onset quality:  Gradual Duration:  3 weeks Timing:  Constant Progression:  Waxing and waning Chronicity:  New Associated symptoms: no abdominal pain, no chest pain, no fever and no shortness of breath     Past Medical History:  Diagnosis Date  . Gallstones   . GERD (gastroesophageal reflux disease)    only prn OTC occasionally  . Headache   . Numbness and tingling of left leg   . Numbness and tingling of right leg     Patient Active Problem List   Diagnosis Date Noted  . Elevated LFTs 05/18/2018  . MS (multiple sclerosis) ([place]) 03/31/2018  . Chronic migraine 08/26/2017  . Chondromalacia, patella, left 06/24/2017  . Healthcare maintenance 12/20/2015  . Contraceptive management 10/11/2014  . Hidradenitis suppurativa of left axilla 06/07/2013  . Dysplasia of cervix, low grade (CIN 1) 07/03/2011  . Class 3 severe obesity due to excess calories without serious comorbidity with body mass index (BMI) of  40.0 to 44.9 in adult [facility]) 05/14/2011    Past Surgical History:  Procedure Laterality Date  . CHOLECYSTECTOMY N/A 12/28/2013   Procedure: LAPAROSCOPIC CHOLECYSTECTOMY;  Surgeon: Fanny Skates, MD;  Location: [facility];  Service: General;  Laterality: N/A;     OB History    Gravida  1   Para  1   Term  0   Preterm  0   AB  0   Living  1     SAB  0   TAB  0   Ectopic  0   Multiple  0   Live Births               Home Medications    Prior to Admission medications   Medication Sig Start Date End Date Taking? Authorizing Provider  ALPRAZolam Duanne Moron) 1 MG tablet Take 1-2 tablets thirty minutes prior to MRI.  May take one additional tablet before entering scanner, if needed.  MUST HAVE DRIVER. 5/32/99   Marcial Pacas, MD  AUBAGIO 7 MG TABS Take 7 mg by mouth daily. 05/13/18   Marcial Pacas, MD  Cholecalciferol (VITAMIN D-3 PO) Take 2,000 Units by mouth daily.    [provider]  DULoxetine (CYMBALTA) 60 MG capsule Take 1 capsule (60 mg total) by mouth daily. 06/01/18   Marcial Pacas, MD  medroxyPROGESTERone (DEPO-PROVERA) 150 MG/ML injection Inject 1 mL (150 mg total) into the muscle every 3 (three) months. 02/11/11  Waldemar Dickens, MD    Family History Family History  Problem Relation Age of Onset  . Hypertension Father   . Hypertension Mother   . Hypertension Sister   . Diabetes Maternal Grandmother   . Diabetes Maternal Grandfather     Social History Social History   Tobacco Use  . Smoking status: Never Smoker  . Smokeless tobacco: Never Used  Substance Use Topics  . Alcohol use: No  . Drug use: No     Allergies   Patient has no known allergies.   Review of Systems Review of Systems  Constitutional: Negative for fever.  Respiratory: Negative for shortness of breath.   Cardiovascular: Negative for chest pain.  Gastrointestinal: Negative for abdominal pain.  All other systems reviewed and are negative.    Physical Exam Updated Vital Signs  BP 135/79 (BP Location: Left Arm)   Pulse (!) 115   Temp 98.7 F (37.1 C) (Oral)   Resp 18   SpO2 97%   Physical Exam Vitals signs and nursing note reviewed.  Constitutional:      General: She is not in acute distress.    Appearance: Normal appearance. She is well-developed.  HENT:     Head: Normocephalic and atraumatic.  Eyes:     Conjunctiva/sclera: Conjunctivae normal.     Pupils: Pupils are equal, round, and reactive to light.  Neck:     Musculoskeletal: Normal range of motion and neck supple.  Cardiovascular:     Rate and Rhythm: Normal rate and regular rhythm.     Heart sounds: Normal heart sounds.  Pulmonary:     Effort: Pulmonary effort is normal. No respiratory distress.     Breath sounds: Normal breath sounds.  Abdominal:     General: There is no distension.     Palpations: Abdomen is soft.     Tenderness: There is no abdominal tenderness.  Musculoskeletal: Normal range of motion.        General: No deformity.     Comments: No appreciable joint effusions  No significant edema at joints   No skin rash, no mucous membrane abnormality    Skin:    General: Skin is warm and dry.  Neurological:     Mental Status: She is alert and oriented to person, place, and time. Mental status is at baseline.     Cranial Nerves: No cranial nerve deficit.     Sensory: No sensory deficit.     Motor: No weakness.     Coordination: Coordination normal.      ED Treatments / Results  Labs (all labs ordered are listed, but only abnormal results are displayed) Labs Reviewed - No data to display  EKG None  Radiology No results found.  Procedures Procedures (including critical care time)  Medications Ordered in ED Medications  ketorolac (TORADOL) 15 MG/ML injection 30 mg (has no administration in time range)     Initial Impression / Assessment and Plan / ED Course  I have reviewed the triage vital signs and the nursing notes.  Pertinent labs & imaging results  that were available during my care of the patient were reviewed by me and considered in my medical decision making (see chart for details).        MDM  Screen complete  [name] was evaluated in Emergency Department on 06/25/2018 for the symptoms described in the history of present illness. She was evaluated in the context of the global COVID-19 pandemic, which necessitated consideration that the  patient might be at risk for infection with the SARS-CoV-2 virus that causes COVID-19. Institutional protocols and algorithms that pertain to the evaluation of patients at risk for COVID-19 are in a state of rapid change based on information released by regulatory bodies including the CDC and federal and state organizations. These policies and algorithms were followed during the patient's care in the ED.   Patient is presenting for evaluation of reported multiple joint pain. Her symptoms are subacute in nature. She is well appearing on exam and is non-toxic.   She was offered screening labs (CBC, CMP, ESR) - she declines same. She did consent to administration of toradol and would like to trial an anti-inflammatory for home use.   Her complaint is suggestive of possible undiagnosed rheumatologic condition. Her complaint does not appear consistent with possible MS flare. She denies recent or current paresthesias, visual changes, headache, or muscle spasm.   Of note, patient with recent labs - performed 03/2018 - demonstrated positive ANA, double-stranded DNA, ENA antibodies, SSA, chromatin antibodies, above findings suggestive of underlying inflammatory process. MS was confirmed by CSF (with more than 5 oligoclonal bands) along with abnormal MRI findings of brain and c-spine.   She does understand the need for close FU with her PMD/Neurology and possibly rheumatology. Strict return precautions (fever, visual change, weakness, etc) given understood.   Final Clinical Impressions(s) / ED Diagnoses    Final diagnoses:  Arthralgia, unspecified joint    ED Discharge Orders         Ordered    naproxen (NAPROSYN) 500 MG tablet  2 times daily     06/25/18 1406           Valarie Merino, MD 06/25/18 1413

## 2018-06-25 NOTE — ED Triage Notes (Signed)
Pt reports she has MS and for past couple weeks having joint swelling. Reports she cant get into see her neurologist.

## 2018-06-25 NOTE — Discharge Instructions (Signed)
Please return for any problem.  Follow-up with your regular care provider as instructed. °

## 2018-07-05 ENCOUNTER — Other Ambulatory Visit (INDEPENDENT_AMBULATORY_CARE_PROVIDER_SITE_OTHER): Payer: Self-pay

## 2018-07-05 ENCOUNTER — Other Ambulatory Visit: Payer: Self-pay

## 2018-07-05 DIAGNOSIS — G35 Multiple sclerosis: Secondary | ICD-10-CM

## 2018-07-05 DIAGNOSIS — G43709 Chronic migraine without aura, not intractable, without status migrainosus: Secondary | ICD-10-CM

## 2018-07-05 DIAGNOSIS — IMO0002 Reserved for concepts with insufficient information to code with codable children: Secondary | ICD-10-CM

## 2018-07-05 DIAGNOSIS — R7989 Other specified abnormal findings of blood chemistry: Secondary | ICD-10-CM

## 2018-07-05 DIAGNOSIS — R945 Abnormal results of liver function studies: Secondary | ICD-10-CM

## 2018-07-05 DIAGNOSIS — Z0289 Encounter for other administrative examinations: Secondary | ICD-10-CM

## 2018-07-06 ENCOUNTER — Other Ambulatory Visit: Payer: Self-pay

## 2018-07-06 ENCOUNTER — Ambulatory Visit (INDEPENDENT_AMBULATORY_CARE_PROVIDER_SITE_OTHER): Payer: Self-pay | Admitting: Neurology

## 2018-07-06 VITALS — BP 110/80 | HR 121 | Temp 98.0°F | Ht 61.0 in | Wt 200.0 lb

## 2018-07-06 DIAGNOSIS — G35 Multiple sclerosis: Secondary | ICD-10-CM

## 2018-07-06 DIAGNOSIS — R7989 Other specified abnormal findings of blood chemistry: Secondary | ICD-10-CM

## 2018-07-06 DIAGNOSIS — R945 Abnormal results of liver function studies: Secondary | ICD-10-CM

## 2018-07-06 LAB — HEPATIC FUNCTION PANEL
ALT: 23 IU/L (ref 0–32)
AST: 37 IU/L (ref 0–40)
Albumin: 2.7 g/dL — ABNORMAL LOW (ref 3.8–4.8)
Alkaline Phosphatase: 60 IU/L (ref 39–117)
Bilirubin Total: 0.3 mg/dL (ref 0.0–1.2)
Bilirubin, Direct: 0.1 mg/dL (ref 0.00–0.40)
Total Protein: 6.4 g/dL (ref 6.0–8.5)

## 2018-07-06 LAB — B. BURGDORFI ANTIBODIES: Lyme IgG/IgM Ab: <0.91 {ISR} (ref 0.00–0.90)

## 2018-07-06 MED ORDER — MIRTAZAPINE 15 MG PO TABS: 15.0000 mg | ORAL_TABLET | Freq: Every day | ORAL | 11 refills | Status: DC

## 2018-07-06 NOTE — Progress Notes (Signed)
PATIENT: Emily Hicks DOB: Sep 27, 1985  Chief Complaint  Patient presents with   Follow-up    Dr. Felecia Shelling room 13, alone. Reports no falls since last seen.   Multiple Sclerosis    On Aubagio.   Anorexia    This started about a month ago. Has lost about 15 lb since end of Feb. She is concerned about this.       HISTORICAL  Emily Hicks is a 33 year old female, seen in request by her primary care physician Dr. Shawna Orleans, [name], for evaluation of left-sided numbness, migraine headaches, initial evaluation was on August 26, 2017.  She noticed subacute onset of left anterior thigh numbness in 2011, later numbness spreading to involving her whole left side, includes left arm, leg, trunk, left face, initially, she also noticed intermittent left hand and left foot posturing, spasm, lasting 10-15 minutes, without associated loss of consciousness, mild clumsiness on her left side, she noticed that when she dances zumba.  Shortly after the initial symptoms, she was pregnant, we have reviewed MRI of the brain with contrast in 2011, there was few scattered periventricular oval-shaped lesions, does raise the possibility of multiple sclerosis versus small vessel disease.  During pregnancy her symptoms has much improved, she no longer has intermittent left hand feet posturing, but continue have persistent mild left-sided paresthesia, in recent few months, she seems to know is more dense numbness at her left anterior thigh and, and the left side of her body. She denies visual loss,  She also reported a history of headaches in her teens, triggered by stress, sleep deprivation, bilateral frontal pressure headaches, with light noise sensitivity sometimes.  MRI of the brain in September 2016, and compared to previous scan in 2011, At least 6 small round and ovoid periventricular and subcortical non-specific T2 hyperintensities noted. No abnormal lesions are seen on post contrast views. These findings  are non-specific and considerations include autoimmune, inflammatory, post-infectious, microvascular ischemic or migraine associated etiologies. Compared to MRI on 08/10/09, there is 1 new lesion in the left posterior temporal region and 2 new punctate lesions in the left frontal lobe. Other lesions appear unchanged.   Reviewed laboratory evaluation, positive ANA, SSA, normal TSH, Lyme titer, CBC, CMP, RPR, HIV, protein electrophoresis.  UPDATE August 27 2017: Last office visit was on November 23, 2014, she continue complains of intermittent left-sided numbness, more intense almost persistent left anterior lateral thigh paresthesia, sometimes spreading to right leg, also complains of frequent migraine headaches, she is referred to see rheumatologist for her abnormal laboratory evaluations, positive ANA antibodies.  She also complains of episodes of left finger and toes paresthesia, lock-up, lasting for 15 seconds, bilateral feet goes to sleep,  She has tried gabapentin previously without significant benefit.  UPDATE September 25 2017: She return for electrodiagnostic study today, which was normal, in specific, there was no left cervical radiculopathy, left lumbosacral radiculopathy,  She continue complains constant left arm, hands, lower extremity paresthesia, Cymbalta 60 mg every day has caused upset stomach  Laboratory evaluation in May 2019 showed normal negative CMP, CBC, TSH, low vitamin D of 17  UPDATE Mar 11 2018: She continues to have constant numbness at the left side of her face, body, limbs, frequent needle pricking sensation, recent onset intermittent right hand paresthesia, sometimes right foot paresthesia, she denies gait abnormality. We again personally reviewed MRI of the brain in 2016, scattered supratentorium T2/flair hyperintensity, compared to MRI in 2011, 1 new lesion in the left posterior  temporal region,  I have ordered MRI of brain and cervical spine in 2019, but she did not  have it scheduled.  Previous laboratory evaluations showed positive ANA, SSA, she does has dry eyes contributed to her contact lens, vitamin D deficiency 17, she only takes it intermittently  UPDATE Mar 31 2018: She continue have almost constant left arm, trunk, leg paresthesia, sometimes extending to left jaw  We personally reviewed MRIs on May 24, 2018. MRI of brain: Few scattered periventricular subcortical oval-shaped lesion, no change compared to previous scan, MRI of cervical spine, intrinsic T2 hyperintensity spinal cord lesion at left C4, C5, right C6 and 7, wrist the possibility of demyelinating disease.  UPDATE June 2nd 2020 She is following up on her treatment option for her relapsing remitting multiple sclerosis  The diagnosis is confirmed by abnormal cervical spine, MRI of the brain, spinal fluid testing showed more than 5 oligoclonal banding She continues to complaints left facial, upper and lower extremity paresthesia, but does not want any neuropathic pain medications,  I personally reviewed MRI of cervical spine in March 24, 2018, intrinsic T2 hyperintensity spinal cord lesion at left C4, C5, right C6 and 7,  MRI of brain few scattered periventricular oval-shaped lesion  Spinal fluid testing April 16, 2018, more than 5 oligoclonal band, WBC 0, RBC 0  Laboratory evaluation in February 2020, JC virus titer is 3.28,  normal negative Lyme titer, vitamin W10, RPR, HIV, folic acid, C-reactive protein, TSH, ESR, CPK, protein electrophoresis, ANA, copper, vitamin D, CBC, CMP, hepatitis B panel, VZV,  She has lost 15 pounds over the past few weeks, has no appetite, joint pain, whole body achy pain, feels generalized weakness, not motivated, not sleeping well,    REVIEW OF SYSTEMS: Full 14 system review of systems performed and notable only for as above All rest review of the system were negative.  ALLERGIES: No Known Allergies  HOME MEDICATIONS: Current Outpatient  Medications  Medication Sig Dispense Refill   ALPRAZolam (XANAX) 1 MG tablet Take 1-2 tablets thirty minutes prior to MRI.  May take one additional tablet before entering scanner, if needed.  MUST HAVE DRIVER. 3 tablet 0   medroxyPROGESTERone (DEPO-PROVERA) 150 MG/ML injection Inject 1 mL (150 mg total) into the muscle every 3 (three) months. 1 mL 11   No current facility-administered medications for this visit.    Facility-Administered Medications Ordered in Other Visits  Medication Dose Route Frequency Provider Last Rate Last Dose   gadopentetate dimeglumine (MAGNEVIST) injection 20 mL  20 mL Intravenous Once PRN Marcial Pacas, MD        PAST MEDICAL HISTORY: Past Medical History:  Diagnosis Date   Gallstones    GERD (gastroesophageal reflux disease)    only prn OTC occasionally   Headache    Numbness and tingling of left leg    Numbness and tingling of right leg     PAST SURGICAL HISTORY: Past Surgical History:  Procedure Laterality Date   CHOLECYSTECTOMY N/A 12/28/2013   Procedure: LAPAROSCOPIC CHOLECYSTECTOMY;  Surgeon: Fanny Skates, MD;  Location: [facility];  Service: General;  Laterality: N/A;    FAMILY HISTORY: Family History  Problem Relation Age of Onset   Hypertension Father    Hypertension Mother    Hypertension Sister    Diabetes Maternal Grandmother    Diabetes Maternal Grandfather     SOCIAL HISTORY: Social History   Socioeconomic History   Marital status: Single    Spouse name: Not on file   Number  of children: 1   Years of education: 16   Highest education level: Bachelor's degree (e.g., BA, AB, BS)  Occupational History   Occupation: Archivist strain: Not on file   Food insecurity:    Worry: Not on file    Inability: Not on file   Transportation needs:    Medical: Not on file    Non-medical: Not on file  Tobacco Use   Smoking status: Never Smoker   Smokeless tobacco: Never Used    Substance and Sexual Activity   Alcohol use: No   Drug use: No   Sexual activity: Not Currently  Lifestyle   Physical activity:    Days per week: Not on file    Minutes per session: Not on file   Stress: Not on file  Relationships   Social connections:    Talks on phone: Not on file    Gets together: Not on file    Attends religious service: Not on file    Active member of club or organization: Not on file    Attends meetings of clubs or organizations: Not on file    Relationship status: Not on file   Intimate partner violence:    Fear of current or ex partner: Not on file    Emotionally abused: Not on file    Physically abused: Not on file    Forced sexual activity: Not on file  Other Topics Concern   Not on file  Social History Narrative   Lives at home with husband and son.   Right-handed.   No caffeine use.     PHYSICAL EXAM   Vitals:   03/31/18 1436  BP: 130/82  Pulse: 84  Weight: 215 lb 8 oz (97.8 kg)  Height: '5\' 1"'  (1.549 m)    Not recorded      Body mass index is 40.72 kg/m.  PHYSICAL EXAMNIATION:  Gen: NAD, conversant, well nourised, obese, well groomed                     Cardiovascular: Regular rate rhythm, no peripheral edema, warm, nontender. Eyes: Conjunctivae clear without exudates or hemorrhage Neck: Supple, no carotid bruits. Pulmonary: Clear to auscultation bilaterally   NEUROLOGICAL EXAM:  MENTAL STATUS: Speech:    Speech is normal; fluent and spontaneous with normal comprehension.  Cognition:     Orientation to time, place and person     Normal recent and remote memory     Normal Attention span and concentration     Normal Language, naming, repeating,spontaneous speech     Fund of knowledge   CRANIAL NERVES: CN II: Visual fields are full to confrontation.  Pupils are round equal and briskly reactive to light. CN III, IV, VI: extraocular movement are normal. No ptosis. CN V: Facial sensation is intact to pinprick in  all 3 divisions bilaterally. Corneal responses are intact.  CN VII: Face is symmetric with normal eye closure and smile. CN VIII: Hearing is normal to rubbing fingers CN IX, X: Palate elevates symmetrically. Phonation is normal. CN XI: Head turning and shoulder shrug are intact CN XII: Tongue is midline with normal movements and no atrophy.  MOTOR: There is no pronator drift of out-stretched arms. Muscle bulk and tone are normal. Muscle strength is normal.  REFLEXES: Reflexes are 2+ and symmetric at the biceps, triceps, knees, and ankles. Plantar responses are flexor.  SENSORY: Intact to light touch, pinprick, positional sensation and vibratory sensation  are intact in fingers and toes.  COORDINATION: Rapid alternating movements and fine finger movements are intact. There is no dysmetria on finger-to-nose and heel-knee-shin.    GAIT/STANCE: Posture is normal. Gait is steady with normal steps, base, arm swing, and turning. Heel and toe walking are normal. Tandem gait is normal.  Romberg is absent.   DIAGNOSTIC DATA (LABS, IMAGING, TESTING) - I reviewed patient records, labs, notes, testing and imaging myself where available.   ASSESSMENT AND PLAN Relapsing remitting multiple sclerosis  Diagnosis is confirmed by abnormal MRI of the brain, cervical cord, CSF showed more than 5 oligoclonal band  Aubagio was planned in April, but she had a significant abnormal liver functional test, repeat test on July 05, 2018 was normal, with exception of mildly low albumin 2.7, previous hepatitis panel was negative  Advised patient to start Aubagio  Remeron 15 mg every night for her depression, weight loss  Will continue follow-up with Dr. Felecia Shelling in 3 months,       Marcial Pacas, M.D. Ph.D.  [facility] [address] [place], [place] [address] Ph: [phone] Fax: [phone]  CC:  Bufford Lope, DO

## 2018-07-09 ENCOUNTER — Encounter: Payer: Self-pay | Admitting: Neurology

## 2018-07-15 ENCOUNTER — Telehealth: Payer: Self-pay | Admitting: *Deleted

## 2018-07-15 NOTE — Telephone Encounter (Signed)
PA for Aubagio completed on [email or web address] (key: [id number]).  Pt has pharmacy coverage through [employer] [phone]).  Pt W[facility]#[id number]. PA approved through 07/15/2023. [place]#[id number].

## 2018-07-16 ENCOUNTER — Ambulatory Visit (HOSPITAL_COMMUNITY)
Admission: RE | Admit: 2018-07-16 | Discharge: 2018-07-16 | Disposition: A | Discharge status: Home / Self Care | Payer: 59 | Source: Ambulatory Visit | Attending: Family Medicine | Admitting: Family Medicine

## 2018-07-16 ENCOUNTER — Other Ambulatory Visit: Payer: Self-pay

## 2018-07-16 ENCOUNTER — Ambulatory Visit (INDEPENDENT_AMBULATORY_CARE_PROVIDER_SITE_OTHER): Payer: BLUE CROSS/BLUE SHIELD | Admitting: Family Medicine

## 2018-07-16 ENCOUNTER — Emergency Department (HOSPITAL_COMMUNITY)
Admission: EM | Admit: 2018-07-16 | Discharge: 2018-07-16 | Disposition: A | Discharge status: Home / Self Care | Payer: 59 | Attending: Emergency Medicine | Admitting: Emergency Medicine

## 2018-07-16 ENCOUNTER — Encounter: Payer: Self-pay | Admitting: Family Medicine

## 2018-07-16 ENCOUNTER — Emergency Department (HOSPITAL_COMMUNITY): Payer: 59

## 2018-07-16 VITALS — BP 100/60 | HR 142 | Ht 61.0 in

## 2018-07-16 DIAGNOSIS — Z79899 Other long term (current) drug therapy: Secondary | ICD-10-CM | POA: Insufficient documentation

## 2018-07-16 DIAGNOSIS — G35 Multiple sclerosis: Secondary | ICD-10-CM | POA: Insufficient documentation

## 2018-07-16 DIAGNOSIS — E669 Obesity, unspecified: Secondary | ICD-10-CM | POA: Insufficient documentation

## 2018-07-16 DIAGNOSIS — R110 Nausea: Secondary | ICD-10-CM | POA: Diagnosis not present

## 2018-07-16 DIAGNOSIS — D72819 Decreased white blood cell count, unspecified: Secondary | ICD-10-CM | POA: Diagnosis not present

## 2018-07-16 DIAGNOSIS — R000 Tachycardia, unspecified: Secondary | ICD-10-CM

## 2018-07-16 DIAGNOSIS — Z6830 Body mass index (BMI) 30.0-30.9, adult: Secondary | ICD-10-CM | POA: Insufficient documentation

## 2018-07-16 DIAGNOSIS — K922 Gastrointestinal hemorrhage, unspecified: Secondary | ICD-10-CM | POA: Insufficient documentation

## 2018-07-16 DIAGNOSIS — Z3009 Encounter for other general counseling and advice on contraception: Secondary | ICD-10-CM

## 2018-07-16 DIAGNOSIS — Z20828 Contact with and (suspected) exposure to other viral communicable diseases: Secondary | ICD-10-CM | POA: Diagnosis not present

## 2018-07-16 DIAGNOSIS — D649 Anemia, unspecified: Secondary | ICD-10-CM | POA: Diagnosis not present

## 2018-07-16 DIAGNOSIS — E876 Hypokalemia: Secondary | ICD-10-CM

## 2018-07-16 LAB — URINALYSIS, ROUTINE W REFLEX MICROSCOPIC
Bilirubin Urine: NEGATIVE
Glucose, UA: NEGATIVE mg/dL
Hgb urine dipstick: NEGATIVE
Ketones, ur: NEGATIVE mg/dL
Nitrite: NEGATIVE
Protein, ur: 100 mg/dL — AB
Specific Gravity, Urine: 1.024 (ref 1.005–1.030)
pH: 6 (ref 5.0–8.0)

## 2018-07-16 LAB — BASIC METABOLIC PANEL
Anion gap: 7 (ref 5–15)
BUN: 12 mg/dL (ref 6–20)
CO2: 22 mmol/L (ref 22–32)
Calcium: 7.7 mg/dL — ABNORMAL LOW (ref 8.9–10.3)
Chloride: 109 mmol/L (ref 98–111)
Creatinine, Ser: 0.83 mg/dL (ref 0.44–1.00)
GFR calc Af Amer: >60 mL/min (ref >60)
GFR calc non Af Amer: >60 mL/min (ref >60)
Glucose, Bld: 100 mg/dL — ABNORMAL HIGH (ref 70–99)
Potassium: 3 mmol/L — ABNORMAL LOW (ref 3.5–5.1)
Sodium: 138 mmol/L (ref 135–145)

## 2018-07-16 LAB — POCT I-STAT EG7
Acid-base deficit: 1 mmol/L (ref 0.0–2.0)
Acid-base deficit: 1 mmol/L (ref 0.0–2.0)
Bicarbonate: 23.4 mmol/L (ref 20.0–28.0)
Bicarbonate: 23.9 mmol/L (ref 20.0–28.0)
Calcium, Ion: 1.06 mmol/L — ABNORMAL LOW (ref 1.15–1.40)
Calcium, Ion: 1.14 mmol/L — ABNORMAL LOW (ref 1.15–1.40)
HCT: 26 % — ABNORMAL LOW (ref 36.0–46.0)
HCT: 44 % (ref 36.0–46.0)
Hemoglobin: 15 g/dL (ref 12.0–15.0)
Hemoglobin: 8.8 g/dL — ABNORMAL LOW (ref 12.0–15.0)
O2 Saturation: 64 %
O2 Saturation: 99 %
Potassium: 3 mmol/L — ABNORMAL LOW (ref 3.5–5.1)
Potassium: 3 mmol/L — ABNORMAL LOW (ref 3.5–5.1)
Sodium: 141 mmol/L (ref 135–145)
Sodium: 141 mmol/L (ref 135–145)
TCO2: 25 mmol/L (ref 22–32)
TCO2: 25 mmol/L (ref 22–32)
pCO2, Ven: 37.7 mmHg — ABNORMAL LOW (ref 44.0–60.0)
pCO2, Ven: 40.4 mmHg — ABNORMAL LOW (ref 44.0–60.0)
pH, Ven: 7.38 (ref 7.250–7.430)
pH, Ven: 7.4 (ref 7.250–7.430)
pO2, Ven: 128 mmHg — ABNORMAL HIGH (ref 32.0–45.0)
pO2, Ven: 34 mmHg (ref 32.0–45.0)

## 2018-07-16 LAB — POC OCCULT BLOOD, ED: Fecal Occult Bld: POSITIVE — AB

## 2018-07-16 LAB — CBC WITH DIFFERENTIAL/PLATELET
Abs Immature Granulocytes: 0.1 10*3/uL — ABNORMAL HIGH (ref 0.00–0.07)
Basophils Absolute: 0 10*3/uL (ref 0.0–0.1)
Basophils Relative: 1 %
Eosinophils Absolute: 0 10*3/uL (ref 0.0–0.5)
Eosinophils Relative: 0 %
HCT: 28.7 % — ABNORMAL LOW (ref 36.0–46.0)
Hemoglobin: 9 g/dL — ABNORMAL LOW (ref 12.0–15.0)
Lymphocytes Relative: 12 %
Lymphs Abs: 0.3 10*3/uL — ABNORMAL LOW (ref 0.7–4.0)
MCH: 26.6 pg (ref 26.0–34.0)
MCHC: 31.4 g/dL (ref 30.0–36.0)
MCV: 84.9 fL (ref 80.0–100.0)
Monocytes Absolute: 0 10*3/uL — ABNORMAL LOW (ref 0.1–1.0)
Monocytes Relative: 1 %
Myelocytes: 3 %
Neutro Abs: 1.8 10*3/uL (ref 1.7–7.7)
Neutrophils Relative %: 83 %
Platelets: 203 10*3/uL (ref 150–400)
RBC: 3.38 MIL/uL — ABNORMAL LOW (ref 3.87–5.11)
RDW: 14.3 % (ref 11.5–15.5)
WBC: 2.2 10*3/uL — ABNORMAL LOW (ref 4.0–10.5)
nRBC: 0 % (ref 0.0–0.2)
nRBC: 0 /100 WBC

## 2018-07-16 LAB — POCT URINE PREGNANCY: Preg Test, Ur: NEGATIVE

## 2018-07-16 LAB — LACTIC ACID, PLASMA: Lactic Acid, Venous: 1.9 mmol/L (ref 0.5–1.9)

## 2018-07-16 MED ORDER — ACETAMINOPHEN 325 MG PO TABS
650.0000 mg | ORAL_TABLET | Freq: Once | ORAL | Status: AC
  Administered 2018-07-16: 650 mg via ORAL
  Filled 2018-07-16: qty 2

## 2018-07-16 MED ORDER — POTASSIUM CHLORIDE CRYS ER 20 MEQ PO TBCR
40.0000 meq | EXTENDED_RELEASE_TABLET | Freq: Once | ORAL | Status: AC
  Administered 2018-07-16: 40 meq via ORAL
  Filled 2018-07-16: qty 2

## 2018-07-16 MED ORDER — SODIUM CHLORIDE 0.9 % IV BOLUS
1000.0000 mL | Freq: Once | INTRAVENOUS | Status: AC
  Administered 2018-07-16: 1000 mL via INTRAVENOUS

## 2018-07-16 NOTE — Discharge Instructions (Addendum)
Follow-up with your neurologist as soon as possible to discuss your symptoms and laboratory findings.  It appears that you are having side effects from your treatment for multiple sclerosis.  Follow-up with your primary care doctor for ongoing management of your other difficulties including low blood count, low potassium, and tachycardia.  Make sure you are drinking plenty of fluids, and try to eat 3 meals each day.  Return here, if needed, for problems.

## 2018-07-16 NOTE — ED Triage Notes (Signed)
Pt arrives with [facility] from PCP's office (across the street from [facility]) for further evaluation. Pt reports not feeling well and has new dx of MS and elevated liver enzymes. Pt is a&o x4.  [facility] vitals:  HR 134 BP 124/80 98% O2 on RA

## 2018-07-16 NOTE — ED Notes (Signed)
Urine Culture sent to Main Lab with UA 

## 2018-07-16 NOTE — Progress Notes (Signed)
    Subjective:  Emily Hicks is a 33 y.o. female who presents to the [facility] today to restart depo  HPI:  Patient is here for regularly scheduled visit to restart Depo.  However she states that she has been feeling generally unwell ever since being diagnosed with MS.  She recently started Aubagio.   She states that she had an episode of chest discomfort yesterday that was new.  She continues to feel generally unwell but is uncertain if this is related to her chest discomfort yesterday or from her MS.  He feels like she has "no energy" She has no palpitations, shortness of breath.  ROS: Per HPI   CC, SH/smoking status, and VS noted  Objective:  Physical Exam: BP 100/60   Pulse (!) 142   Ht 5\' 1"  (1.549 m)   SpO2 99%   BMI 37.79 kg/m   Gen: NAD, sitting in chair and appears unwell/fatigued but not in acute distress CV: Tachycardic Pulm: NWOB, CTAB with no crackles, wheezes, or rhonchi Neuro: grossly normal, moves all extremities Psych: Normal affect and thought content  Results for orders placed or performed in visit on 07/16/18 (from the past 72 hour(s))  POCT urine pregnancy     Status: None   Collection Time: 07/16/18  1:38 PM  Result Value Ref Range   Preg Test, Ur Negative Negative   EKG: sinus tachycardia  Assessment/Plan:  Tachycardia Patient found to have tachycardia with HR 142. Concern that her fatigue is from symptomatic tachycardia and she also appears unwell in clinic today. She did have an episode of chest discomfort yesterday that could be related but does not have currently. EKG showing sinus tachycardia. Transported to ER by EMS. Signout given to triage RN.    Orders Placed This Encounter  Procedures  . POCT urine pregnancy     Bufford Lope, DO PGY-3, [facility] 07/16/2018 1:44 PM

## 2018-07-16 NOTE — ED Notes (Signed)
Patient verbalizes understanding of discharge instructions. Opportunity for questioning and answers were provided. Armband removed by staff, pt discharged from ED.  

## 2018-07-16 NOTE — ED Provider Notes (Signed)
[facility] Provider Note   CSN: [id number] Arrival date & time: 07/16/18  1429    History   Chief Complaint Chief Complaint  Patient presents with  . Tachycardia    sent here from PCP    HPI Emily Hicks is a 33 y.o. female.     HPI   She presents for evaluation of tachycardia.  She went to her PCP office today to evaluate ongoing lethargy, with nausea, for several days.  he denies vomiting.  She denies shortness of breath, cough, focal weakness or paresthesia.  She was recently diagnosed with multiple sclerosis.  She complains of generalized arthralgia.  He denies dysuria, urinary frequency, hematuria, recent tick bite, known sick exposure, or other recent illnesses.  There are no other known modifying factors.    Past Medical History:  Diagnosis Date  . Gallstones   . GERD (gastroesophageal reflux disease)    only prn OTC occasionally  . Headache   . Numbness and tingling of left leg   . Numbness and tingling of right leg     Patient Active Problem List   Diagnosis Date Noted  . Tachycardia 07/16/2018  . Elevated LFTs 05/18/2018  . MS (multiple sclerosis) (HCC) 03/31/2018  . Chronic migraine 08/26/2017  . Chondromalacia, patella, left 06/24/2017  . Healthcare maintenance 12/20/2015  . Contraceptive management 10/11/2014  . Hidradenitis suppurativa of left axilla 06/07/2013  . Dysplasia of cervix, low grade (CIN 1) 07/03/2011  . Class 3 severe obesity due to excess calories without serious comorbidity with body mass index (BMI) of 40.0 to 44.9 in adult [facility](HCC) 05/14/2011    Past Surgical History:  Procedure Laterality Date  . CHOLECYSTECTOMY N/A 12/28/2013   Procedure: LAPAROSCOPIC CHOLECYSTECTOMY;  Surgeon: Claud KelpHaywood Ingram, MD;  Location: [facility] ORS;  Service: General;  Laterality: N/A;     OB History    Gravida  1   Para  1   Term  0   Preterm  0   AB  0   Living  1     SAB  0   TAB  0   Ectopic  0   Multiple  0   Live Births               Home Medications    Prior to Admission medications   Medication Sig Start Date End Date Taking? Authorizing Provider  AUBAGIO 7 MG TABS Take 7 mg by mouth daily. 05/13/18  Yes Levert FeinsteinYan, Yijun, MD  DULoxetine (CYMBALTA) 60 MG capsule Take 1 capsule (60 mg total) by mouth daily. 06/01/18  Yes Levert FeinsteinYan, Yijun, MD  medroxyPROGESTERone (DEPO-PROVERA) 150 MG/ML injection Inject 1 mL (150 mg total) into the muscle every 3 (three) months. 02/11/11  Yes Ozella RocksMerrell, David J, MD  mirtazapine (REMERON) 15 MG tablet Take 1 tablet (15 mg total) by mouth at bedtime. 07/06/18  Yes Levert FeinsteinYan, Yijun, MD  naproxen (NAPROSYN) 500 MG tablet Take 1 tablet (500 mg total) by mouth 2 (two) times daily. Patient taking differently: Take 500 mg by mouth 2 (two) times daily as needed for mild pain.  06/25/18  Yes Wynetta FinesMessick, Peter C, MD  ALPRAZolam Prudy Feeler(XANAX) 1 MG tablet Take 1-2 tablets thirty minutes prior to MRI.  May take one additional tablet before entering scanner, if needed.  MUST HAVE DRIVER. Patient not taking: Reported on 07/16/2018 03/16/18   Levert FeinsteinYan, Yijun, MD    Family History Family History  Problem Relation Age of Onset  . Hypertension Father   .  Hypertension Mother   . Hypertension Sister   . Diabetes Maternal Grandmother   . Diabetes Maternal Grandfather     Social History Social History   Tobacco Use  . Smoking status: Never Smoker  . Smokeless tobacco: Never Used  Substance Use Topics  . Alcohol use: No  . Drug use: No     Allergies   Patient has no known allergies.   Review of Systems Review of Systems  All other systems reviewed and are negative.    Physical Exam Updated Vital Signs BP 106/62   Pulse (!) 114   Temp 99.6 F (37.6 C) (Oral)   Resp (!) 21   Ht 5\' 1"  (1.549 m)   Wt 72.6 kg   SpO2 98%   BMI 30.23 kg/m   Physical Exam Vitals signs and nursing note reviewed.  Constitutional:      General: She is not in acute distress.    Appearance: She is  well-developed. She is not ill-appearing, toxic-appearing or diaphoretic.  HENT:     Head: Normocephalic and atraumatic.     Right Ear: External ear normal.     Left Ear: External ear normal.     Mouth/Throat:     Mouth: Mucous membranes are moist.     Pharynx: No oropharyngeal exudate or posterior oropharyngeal erythema.  Eyes:     Conjunctiva/sclera: Conjunctivae normal.     Pupils: Pupils are equal, round, and reactive to light.  Neck:     Musculoskeletal: Normal range of motion and neck supple.     Trachea: Phonation normal.  Cardiovascular:     Rate and Rhythm: Normal rate and regular rhythm.     Heart sounds: Normal heart sounds.  Pulmonary:     Effort: Pulmonary effort is normal. No respiratory distress.     Breath sounds: Normal breath sounds. No stridor. No rhonchi.  Chest:     Chest wall: No tenderness.  Abdominal:     General: There is no distension.     Palpations: Abdomen is soft. There is no mass.     Tenderness: There is no abdominal tenderness.  Musculoskeletal: Normal range of motion.        General: No swelling or tenderness.     Right lower leg: No edema.     Left lower leg: No edema.  Skin:    General: Skin is warm and dry.     Coloration: Skin is not jaundiced or pale.  Neurological:     Mental Status: She is alert and oriented to person, place, and time.     Cranial Nerves: No cranial nerve deficit.     Sensory: No sensory deficit.     Motor: No abnormal muscle tone.     Coordination: Coordination normal.  Psychiatric:        Mood and Affect: Mood normal.        Behavior: Behavior normal.        Thought Content: Thought content normal.        Judgment: Judgment normal.      ED Treatments / Results  Labs (all labs ordered are listed, but only abnormal results are displayed) Labs Reviewed  BASIC METABOLIC PANEL - Abnormal; Notable for the following components:      Result Value   Potassium 3.0 (*)    Glucose, Bld 100 (*)    Calcium 7.7 (*)     All other components within normal limits  CBC WITH DIFFERENTIAL/PLATELET - Abnormal; Notable for the following components:  WBC 2.2 (*)    RBC 3.38 (*)    Hemoglobin 9.0 (*)    HCT 28.7 (*)    Lymphs Abs 0.3 (*)    Monocytes Absolute 0.0 (*)    Abs Immature Granulocytes 0.10 (*)    All other components within normal limits  URINALYSIS, ROUTINE W REFLEX MICROSCOPIC - Abnormal; Notable for the following components:   Color, Urine AMBER (*)    APPearance HAZY (*)    Protein, ur 100 (*)    Leukocytes,Ua TRACE (*)    Bacteria, UA FEW (*)    All other components within normal limits  POCT I-STAT EG7 - Abnormal; Notable for the following components:   pCO2, Ven 40.4 (*)    Potassium 3.0 (*)    Calcium, Ion 1.14 (*)    All other components within normal limits  POCT I-STAT EG7 - Abnormal; Notable for the following components:   pCO2, Ven 37.7 (*)    pO2, Ven 128.0 (*)    Potassium 3.0 (*)    Calcium, Ion 1.06 (*)    HCT 26.0 (*)    Hemoglobin 8.8 (*)    All other components within normal limits  POC OCCULT BLOOD, ED - Abnormal; Notable for the following components:   Fecal Occult Bld POSITIVE (*)    All other components within normal limits  CULTURE, BLOOD (ROUTINE X 2)  CULTURE, BLOOD (ROUTINE X 2)  NOVEL CORONAVIRUS, NAA (HOSPITAL ORDER, SEND-OUT TO REF LAB)  LACTIC ACID, PLASMA  BLOOD GAS, VENOUS    EKG EKG Interpretation  Date/Time:  Friday July 16 2018 14:45:39 EDT Ventricular Rate:  131 PR Interval:    QRS Duration: 85 QT Interval:  282 QTC Calculation: 417 R Axis:   65 Text Interpretation:  Sinus tachycardia Borderline T wave abnormalities Since last tracing of earlier today No significant change was found Confirmed by Mancel Bale [phone]) on 07/16/2018 3:54:20 PM   Radiology Dg Chest Port 1 View  Result Date: 07/16/2018 CLINICAL DATA:  Fatigue EXAM: PORTABLE CHEST 1 VIEW COMPARISON:  None. FINDINGS: The heart size and mediastinal contours are within normal  limits. Both lungs are clear. The visualized skeletal structures are unremarkable. IMPRESSION: No active disease. Electronically Signed   By: Alcide Clever M.D.   On: 07/16/2018 15:18    Procedures .Critical Care Performed by: Mancel Bale, MD Authorized by: Mancel Bale, MD   Critical care provider statement:    Critical care time (minutes):  35   Critical care start time:  07/16/2018 2:30 PM   Critical care end time:  07/16/2018 7:04 PM   Critical care time was exclusive of:  Separately billable procedures and treating other patients   Critical care was necessary to treat or prevent imminent or life-threatening deterioration of the following conditions:  Circulatory failure   Critical care was time spent personally by me on the following activities:  Blood draw for specimens, development of treatment plan with patient or surrogate, discussions with consultants, evaluation of patient's response to treatment, examination of patient, obtaining history from patient or surrogate, ordering and performing treatments and interventions, ordering and review of laboratory studies, pulse oximetry, re-evaluation of patient's condition, review of old charts and ordering and review of radiographic studies   (including critical care time)  Medications Ordered in ED Medications  potassium chloride SA (K-DUR) CR tablet 40 mEq (has no administration in time range)  sodium chloride 0.9 % bolus 1,000 mL (0 mLs Intravenous Stopped 07/16/18 1658)  acetaminophen (TYLENOL) tablet  650 mg (650 mg Oral Given 07/16/18 1519)     Initial Impression / Assessment and Plan / ED Course  I have reviewed the triage vital signs and the nursing notes.  Pertinent labs & imaging results that were available during my care of the patient were reviewed by me and considered in my medical decision making (see chart for details).  Clinical Course as of Jul 15 1928  Fri Jul 16, 2018  1748 Normal except presence of protein,  leukocytes, white cells, bacteria, and squamous epithelial cells.  Urinalysis, Routine w reflex microscopic(!) [EW]  1748 Normal except potassium low, glucose high, calcium low  Basic metabolic panel(!) [EW]  1748 Normal except PCO2 low, PO2 high, potassium low, calcium low, hematocrit low, hemoglobin low  POCT I-Stat EG7(!) [EW]  1749 Normal except white count low, hemoglobin low, lymphocytes low, immature granulocytes high  CBC with Differential(!) [EW]  1749 No CHF or infiltrate, image reviewed by me  DG Chest Port 1 View [EW]  1848 Normal  Lactic acid, plasma [EW]  1848 Bilirubin Urine: NEGATIVE [EW]  1907 Abnormal, blood present  POC occult blood, ED Provider will collect(!) [EW]    Clinical Course User Index [EW] Mancel BaleWentz, Modene Andy, MD        Patient Vitals for the past 24 hrs:  BP Temp Temp src Pulse Resp SpO2 Height Weight  07/16/18 1845 106/62 - - (!) 114 (!) 21 98 % - -  07/16/18 1830 108/64 - - (!) 111 13 98 % - -  07/16/18 1800 107/64 - - (!) 110 (!) 22 99 % - -  07/16/18 1745 108/64 - - (!) 115 (!) 22 98 % - -  07/16/18 1730 109/68 - - (!) 117 18 100 % - -  07/16/18 1658 - 99.6 F (37.6 C) Oral - - - - -  07/16/18 1658 - - - (!) 116 17 98 % - -  07/16/18 1657 109/61 - - (!) 116 - 97 % - -  07/16/18 1645 - - - (!) 119 16 98 % - -  07/16/18 1545 - - - (!) 118 16 99 % - -  07/16/18 1515 105/77 - - (!) 125 19 100 % - -  07/16/18 1453 - - - - - - 5\' 1"  (1.549 m) 72.6 kg  07/16/18 1446 108/65 (!) 103.2 F (39.6 C) Oral (!) 131 16 100 % - -  07/16/18 1445 108/65 - - (!) 132 15 100 % - -    6:56 PM Reevaluation with update and discussion. After initial assessment and treatment, an updated evaluation reveals no additional complaints.  She is fairly comfortable at this time.  She denies blood in bowel movements.  She has not had a period because she is on Depo-Provera.  Findings discussed and questions answered. Mancel BaleElliott Malikiah Debarr   Medical Decision Making: Cardiac,  nonspecific, with fever.  No clear sign of infection.  Chest x-ray is unrevealing.  Possible Covid-19 infection.  Doubt UTI, serious bacterial infection or metabolic instability.  Incidental hypokalemia, leukocytopenia, and anemia.  She recently started Aubagio for MS, which can cause dyscrasia.  Can also cause nausea.  No indication for hospitalization or further ED intervention at this time.  Patient stable for outpatient management.  Emily DublinJamie J Hicks was evaluated in Emergency Department on 07/16/2018 for the symptoms described in the history of present illness. She was evaluated in the context of the global COVID-19 pandemic, which necessitated consideration that the patient might be at risk for  infection with the SARS-CoV-2 virus that causes COVID-19. Institutional protocols and algorithms that pertain to the evaluation of patients at risk for COVID-19 are in a state of rapid change based on information released by regulatory bodies including the CDC and federal and state organizations. These policies and algorithms were followed during the patient's care in the ED.  CRITICAL CARE-S Performed by: Daleen Bo  Nursing Notes Reviewed/ Care Coordinated Applicable Imaging Reviewed Interpretation of Laboratory Data incorporated into ED treatment  The patient appears reasonably screened and/or stabilized for discharge and I doubt any other medical condition or other [facility] requiring further screening, evaluation, or treatment in the ED at this time prior to discharge.  Plan: Home Medications-continue usual; Home Treatments-gradual advance diet; return here if the recommended treatment, does not improve the symptoms; Recommended follow up-follow-up with neurology ASAP to discuss ongoing management with Aubagio, with consideration of side effects.  Follow-up with PCP in 2 weeks, for a checkup, and repeat labs.  Final Clinical Impressions(s) / ED Diagnoses   Final diagnoses:  Tachycardia  Nausea   Leukopenia, unspecified type  Anemia, unspecified type  Hypokalemia  Gastrointestinal hemorrhage, unspecified gastrointestinal hemorrhage type    ED Discharge Orders    None       Daleen Bo, MD 07/16/18 1930

## 2018-07-16 NOTE — Assessment & Plan Note (Addendum)
Patient found to have tachycardia with HR 142. Concern that her fatigue is from symptomatic tachycardia and she also appears unwell in clinic today. She did have an episode of chest discomfort yesterday that could be related but does not have currently. EKG showing sinus tachycardia. Transported to ER by EMS. Signout given to triage RN.

## 2018-07-17 LAB — NOVEL CORONAVIRUS, NAA (HOSP ORDER, SEND-OUT TO REF LAB; TAT 18-24 HRS): SARS-CoV-2, NAA: NOT DETECTED

## 2018-07-19 ENCOUNTER — Ambulatory Visit: Payer: BLUE CROSS/BLUE SHIELD | Admitting: Neurology

## 2018-07-20 ENCOUNTER — Other Ambulatory Visit: Payer: Self-pay

## 2018-07-20 ENCOUNTER — Telehealth: Payer: Self-pay | Admitting: Neurology

## 2018-07-20 ENCOUNTER — Ambulatory Visit (INDEPENDENT_AMBULATORY_CARE_PROVIDER_SITE_OTHER): Payer: 59 | Admitting: Family Medicine

## 2018-07-20 ENCOUNTER — Encounter: Payer: Self-pay | Admitting: Family Medicine

## 2018-07-20 VITALS — BP 110/58 | HR 136

## 2018-07-20 DIAGNOSIS — D72819 Decreased white blood cell count, unspecified: Secondary | ICD-10-CM | POA: Insufficient documentation

## 2018-07-20 DIAGNOSIS — Z3042 Encounter for surveillance of injectable contraceptive: Secondary | ICD-10-CM

## 2018-07-20 DIAGNOSIS — D72810 Lymphocytopenia: Secondary | ICD-10-CM

## 2018-07-20 DIAGNOSIS — G35 Multiple sclerosis: Secondary | ICD-10-CM

## 2018-07-20 MED ORDER — MEDROXYPROGESTERONE ACETATE 150 MG/ML IM SUSY
150.0000 mg | PREFILLED_SYRINGE | Freq: Once | INTRAMUSCULAR | Status: AC
  Administered 2018-07-20: 150 mg via INTRAMUSCULAR

## 2018-07-20 NOTE — Telephone Encounter (Signed)
mychart message sent to patient to let her know of this update. Jazmin Hartsell,CMA

## 2018-07-20 NOTE — Telephone Encounter (Signed)
I failed to reach her twice. Please call her again, she presented to ED on June 16th 2020 today for tachycardia, Hg 9.0, WBC 2.2  She was just started on Aubagio since June 2,  Please advise her to stop Aubagio, continue to work with her PCP for further evaluation. She wants to see Dr. Felecia Shelling, can you move her to Dr. Garth Bigness schedule soon.

## 2018-07-20 NOTE — Telephone Encounter (Signed)
The patient is aware of Dr. Rhea Belton response below and verbalized understanding to discontinue Aubagio.  Her appt with Dr. Felecia Shelling has been moved to 08/02/2018 at 1pm.  She is aware to arrive to the office for check-in at 1pm.

## 2018-07-20 NOTE — Patient Instructions (Signed)
I will call your neurologist today. Please keep your phone on you for updates later today.

## 2018-07-20 NOTE — Telephone Encounter (Signed)
Can patient please be contacted for appointment after neuro visit on 08/02/18. Will need f/u for leukopenia with CBC recheck at that time

## 2018-07-20 NOTE — Assessment & Plan Note (Signed)
Given depo provera today

## 2018-07-20 NOTE — Addendum Note (Signed)
Addended by: Noberto Retort C on: 07/20/2018 02:04 PM   Modules accepted: Orders

## 2018-07-20 NOTE — Assessment & Plan Note (Addendum)
Patient continues to have significant symptoms of MS with stiffness.  She is on Aubagio.  There is some concern that it is causing significant side effects. She has bloodwork showing leukopenia at her ED visit which was only 4 days ago so will hold off rechecking at this point. Patient currently has a visit to establish with a new neurologist Dr. Felecia Shelling on 08/10/2018. Will call Dr. Krista Blue to see if patient should be seen sooner and if patient should stop aubagio in the interim.   Update: spoke with Dr. Rhea Belton office who recommended epic staff message as a better means of communication.

## 2018-07-20 NOTE — Progress Notes (Signed)
    Subjective:  Emily Hicks is a 33 y.o. female who presents to the [facility] today for ED follow up  HPI:  Patient was evaluated in the emergency room on 6/12 after being seen in the [facility] for symptomatic tachycardia.  She was found to have a fever with no clear sign of infection.  She was felt to have a dyscrasia from her recent start of Aubagio for MS.  She was told to follow-up closely with her neurologist.  Patient states that she is continued to have some stiffness.  She also has been having some discoloration of her fingertips.  She also continues to have a fast heartbeat that does not cause any chest pain or palpitations.  She attempted to call her neurologist for an appointment however she wanted to switch to a different neurologist and there for appointment was set to 3 weeks from now. She has had no new symptoms in the last 4 days.  She would like a Depa provera shot today that she originally was scheduled for on 6/12.  She has not had any sexual activity.    ROS: Per HPI  Social Hx: She reports that she has never smoked. She has never used smokeless tobacco. She reports that she does not drink alcohol or use drugs.   CC, SH/smoking status, and VS noted  Objective:  Physical Exam: BP (!) 110/58   Pulse (!) 136   SpO2 96%   Gen: NAD, resting comfortably Pulm: NWOB Skin: Discoloration of all 10 fingertips that blanches with palpation consistent with purpura Neuro: grossly normal, moves all extremities Psych: Normal affect and thought content   Assessment/Plan:  MS (multiple sclerosis) ([place]) Patient continues to have significant symptoms of MS with stiffness.  She is on Aubagio.  There is some concern that it is causing significant side effects. She has bloodwork showing leukopenia at her ED visit which was only 4 days ago so will hold off rechecking at this point. Patient currently has a visit to establish with a new neurologist Dr. Felecia Shelling on 08/10/2018. Will call Dr. Krista Blue to see  if patient should be seen sooner and if patient should stop aubagio in the interim.   Update: spoke with Dr. Rhea Belton office who recommended epic staff message as a better means of communication.   Contraceptive management Given depo provera today   Meds ordered this encounter  Medications  . medroxyPROGESTERone Acetate SUSY 150 mg    Bufford Lope, DO PGY-3, [facility] 07/20/2018 9:55 AM

## 2018-07-21 LAB — CULTURE, BLOOD (ROUTINE X 2)
Culture: NO GROWTH
Culture: NO GROWTH
Special Requests: ADEQUATE
Special Requests: ADEQUATE

## 2018-07-27 ENCOUNTER — Inpatient Hospital Stay (HOSPITAL_COMMUNITY): Payer: 59

## 2018-07-27 ENCOUNTER — Emergency Department (HOSPITAL_COMMUNITY): Payer: 59

## 2018-07-27 ENCOUNTER — Other Ambulatory Visit: Payer: Self-pay

## 2018-07-27 ENCOUNTER — Encounter (HOSPITAL_COMMUNITY): Payer: Self-pay

## 2018-07-27 ENCOUNTER — Inpatient Hospital Stay (HOSPITAL_COMMUNITY)
Admission: EM | Admit: 2018-07-27 | Discharge: 2018-08-16 | DRG: 871 | Disposition: A | Discharge status: Home Health | Payer: 59 | Attending: Internal Medicine | Admitting: Internal Medicine

## 2018-07-27 DIAGNOSIS — D72819 Decreased white blood cell count, unspecified: Secondary | ICD-10-CM | POA: Diagnosis present

## 2018-07-27 DIAGNOSIS — N179 Acute kidney failure, unspecified: Secondary | ICD-10-CM | POA: Diagnosis present

## 2018-07-27 DIAGNOSIS — Y92009 Unspecified place in unspecified non-institutional (private) residence as the place of occurrence of the external cause: Secondary | ICD-10-CM

## 2018-07-27 DIAGNOSIS — E876 Hypokalemia: Secondary | ICD-10-CM | POA: Diagnosis present

## 2018-07-27 DIAGNOSIS — D61818 Other pancytopenia: Secondary | ICD-10-CM

## 2018-07-27 DIAGNOSIS — R6510 Systemic inflammatory response syndrome (SIRS) of non-infectious origin without acute organ dysfunction: Secondary | ICD-10-CM

## 2018-07-27 DIAGNOSIS — B3781 Candidal esophagitis: Secondary | ICD-10-CM | POA: Diagnosis present

## 2018-07-27 DIAGNOSIS — G35A Relapsing-remitting multiple sclerosis: Secondary | ICD-10-CM | POA: Diagnosis present

## 2018-07-27 DIAGNOSIS — D61811 Other drug-induced pancytopenia: Secondary | ICD-10-CM | POA: Diagnosis present

## 2018-07-27 DIAGNOSIS — Z20828 Contact with and (suspected) exposure to other viral communicable diseases: Secondary | ICD-10-CM | POA: Diagnosis present

## 2018-07-27 DIAGNOSIS — R400 Somnolence: Secondary | ICD-10-CM | POA: Diagnosis not present

## 2018-07-27 DIAGNOSIS — R05 Cough: Secondary | ICD-10-CM | POA: Diagnosis not present

## 2018-07-27 DIAGNOSIS — D638 Anemia in other chronic diseases classified elsewhere: Secondary | ICD-10-CM | POA: Diagnosis present

## 2018-07-27 DIAGNOSIS — R918 Other nonspecific abnormal finding of lung field: Secondary | ICD-10-CM | POA: Diagnosis not present

## 2018-07-27 DIAGNOSIS — E860 Dehydration: Secondary | ICD-10-CM | POA: Diagnosis present

## 2018-07-27 DIAGNOSIS — R197 Diarrhea, unspecified: Secondary | ICD-10-CM | POA: Diagnosis not present

## 2018-07-27 DIAGNOSIS — G35 Multiple sclerosis: Secondary | ICD-10-CM | POA: Diagnosis present

## 2018-07-27 DIAGNOSIS — R1310 Dysphagia, unspecified: Secondary | ICD-10-CM | POA: Diagnosis present

## 2018-07-27 DIAGNOSIS — Z9049 Acquired absence of other specified parts of digestive tract: Secondary | ICD-10-CM

## 2018-07-27 DIAGNOSIS — G4700 Insomnia, unspecified: Secondary | ICD-10-CM | POA: Diagnosis not present

## 2018-07-27 DIAGNOSIS — R509 Fever, unspecified: Secondary | ICD-10-CM | POA: Diagnosis present

## 2018-07-27 DIAGNOSIS — F419 Anxiety disorder, unspecified: Secondary | ICD-10-CM

## 2018-07-27 DIAGNOSIS — G9341 Metabolic encephalopathy: Secondary | ICD-10-CM | POA: Diagnosis present

## 2018-07-27 DIAGNOSIS — M329 Systemic lupus erythematosus, unspecified: Secondary | ICD-10-CM | POA: Diagnosis present

## 2018-07-27 DIAGNOSIS — B370 Candidal stomatitis: Secondary | ICD-10-CM | POA: Diagnosis present

## 2018-07-27 DIAGNOSIS — E871 Hypo-osmolality and hyponatremia: Secondary | ICD-10-CM | POA: Diagnosis present

## 2018-07-27 DIAGNOSIS — Z79899 Other long term (current) drug therapy: Secondary | ICD-10-CM

## 2018-07-27 DIAGNOSIS — R4182 Altered mental status, unspecified: Secondary | ICD-10-CM | POA: Diagnosis present

## 2018-07-27 DIAGNOSIS — J189 Pneumonia, unspecified organism: Secondary | ICD-10-CM | POA: Diagnosis not present

## 2018-07-27 DIAGNOSIS — D649 Anemia, unspecified: Secondary | ICD-10-CM | POA: Diagnosis not present

## 2018-07-27 DIAGNOSIS — F329 Major depressive disorder, single episode, unspecified: Secondary | ICD-10-CM | POA: Diagnosis present

## 2018-07-27 DIAGNOSIS — E43 Unspecified severe protein-calorie malnutrition: Secondary | ICD-10-CM | POA: Diagnosis present

## 2018-07-27 DIAGNOSIS — D8989 Other specified disorders involving the immune mechanism, not elsewhere classified: Secondary | ICD-10-CM | POA: Diagnosis not present

## 2018-07-27 DIAGNOSIS — R000 Tachycardia, unspecified: Secondary | ICD-10-CM | POA: Diagnosis not present

## 2018-07-27 DIAGNOSIS — E870 Hyperosmolality and hypernatremia: Secondary | ICD-10-CM

## 2018-07-27 DIAGNOSIS — R002 Palpitations: Secondary | ICD-10-CM | POA: Diagnosis not present

## 2018-07-27 DIAGNOSIS — K219 Gastro-esophageal reflux disease without esophagitis: Secondary | ICD-10-CM | POA: Diagnosis present

## 2018-07-27 DIAGNOSIS — E669 Obesity, unspecified: Secondary | ICD-10-CM

## 2018-07-27 DIAGNOSIS — L8990 Pressure ulcer of unspecified site, unspecified stage: Secondary | ICD-10-CM | POA: Diagnosis present

## 2018-07-27 DIAGNOSIS — K0889 Other specified disorders of teeth and supporting structures: Secondary | ICD-10-CM | POA: Diagnosis not present

## 2018-07-27 DIAGNOSIS — R945 Abnormal results of liver function studies: Secondary | ICD-10-CM | POA: Diagnosis not present

## 2018-07-27 DIAGNOSIS — R6520 Severe sepsis without septic shock: Secondary | ICD-10-CM | POA: Diagnosis present

## 2018-07-27 DIAGNOSIS — R748 Abnormal levels of other serum enzymes: Secondary | ICD-10-CM | POA: Diagnosis present

## 2018-07-27 DIAGNOSIS — T50995A Adverse effect of other drugs, medicaments and biological substances, initial encounter: Secondary | ICD-10-CM | POA: Diagnosis present

## 2018-07-27 DIAGNOSIS — F32A Depression, unspecified: Secondary | ICD-10-CM

## 2018-07-27 DIAGNOSIS — A419 Sepsis, unspecified organism: Principal | ICD-10-CM | POA: Diagnosis present

## 2018-07-27 DIAGNOSIS — D696 Thrombocytopenia, unspecified: Secondary | ICD-10-CM | POA: Diagnosis not present

## 2018-07-27 DIAGNOSIS — R760 Raised antibody titer: Secondary | ICD-10-CM | POA: Diagnosis not present

## 2018-07-27 DIAGNOSIS — E66811 Obesity, class 1: Secondary | ICD-10-CM

## 2018-07-27 DIAGNOSIS — Z6834 Body mass index (BMI) 34.0-34.9, adult: Secondary | ICD-10-CM

## 2018-07-27 HISTORY — DX: Multiple sclerosis: G35

## 2018-07-27 LAB — CBC WITH DIFFERENTIAL/PLATELET
Abs Immature Granulocytes: 0.04 10*3/uL (ref 0.00–0.07)
Basophils Absolute: 0 10*3/uL (ref 0.0–0.1)
Basophils Relative: 0 %
Eosinophils Absolute: 0 10*3/uL (ref 0.0–0.5)
Eosinophils Relative: 0 %
HCT: 29.8 % — ABNORMAL LOW (ref 36.0–46.0)
Hemoglobin: 8.9 g/dL — ABNORMAL LOW (ref 12.0–15.0)
Immature Granulocytes: 2 %
Lymphocytes Relative: 13 %
Lymphs Abs: 0.3 10*3/uL — ABNORMAL LOW (ref 0.7–4.0)
MCH: 26.4 pg (ref 26.0–34.0)
MCHC: 29.9 g/dL — ABNORMAL LOW (ref 30.0–36.0)
MCV: 88.4 fL (ref 80.0–100.0)
Monocytes Absolute: 0.1 10*3/uL (ref 0.1–1.0)
Monocytes Relative: 4 %
Neutro Abs: 2 10*3/uL (ref 1.7–7.7)
Neutrophils Relative %: 81 %
Platelets: 265 10*3/uL (ref 150–400)
RBC: 3.37 MIL/uL — ABNORMAL LOW (ref 3.87–5.11)
RDW: 16.4 % — ABNORMAL HIGH (ref 11.5–15.5)
WBC: 2.5 10*3/uL — ABNORMAL LOW (ref 4.0–10.5)
nRBC: 0 % (ref 0.0–0.2)

## 2018-07-27 LAB — COMPREHENSIVE METABOLIC PANEL
ALT: 52 U/L — ABNORMAL HIGH (ref 0–44)
AST: 169 U/L — ABNORMAL HIGH (ref 15–41)
Albumin: 2.6 g/dL — ABNORMAL LOW (ref 3.5–5.0)
Alkaline Phosphatase: 56 U/L (ref 38–126)
Anion gap: 10 (ref 5–15)
BUN: 26 mg/dL — ABNORMAL HIGH (ref 6–20)
CO2: 23 mmol/L (ref 22–32)
Calcium: 8.2 mg/dL — ABNORMAL LOW (ref 8.9–10.3)
Chloride: 124 mmol/L — ABNORMAL HIGH (ref 98–111)
Creatinine, Ser: 1.21 mg/dL — ABNORMAL HIGH (ref 0.44–1.00)
GFR calc Af Amer: >60 mL/min (ref >60)
GFR calc non Af Amer: 59 mL/min — ABNORMAL LOW (ref >60)
Glucose, Bld: 120 mg/dL — ABNORMAL HIGH (ref 70–99)
Potassium: 3.2 mmol/L — ABNORMAL LOW (ref 3.5–5.1)
Sodium: 157 mmol/L — ABNORMAL HIGH (ref 135–145)
Total Bilirubin: 0.5 mg/dL (ref 0.3–1.2)
Total Protein: 7.3 g/dL (ref 6.5–8.1)

## 2018-07-27 LAB — URINALYSIS, ROUTINE W REFLEX MICROSCOPIC
Bacteria, UA: NONE SEEN
Bilirubin Urine: NEGATIVE
Glucose, UA: NEGATIVE mg/dL
Ketones, ur: NEGATIVE mg/dL
Leukocytes,Ua: NEGATIVE
Nitrite: NEGATIVE
Protein, ur: 100 mg/dL — AB
Specific Gravity, Urine: 1.026 (ref 1.005–1.030)
pH: 6 (ref 5.0–8.0)

## 2018-07-27 LAB — LACTIC ACID, PLASMA
Lactic Acid, Venous: 2 mmol/L (ref 0.5–1.9)
Lactic Acid, Venous: 1.7 mmol/L (ref 0.5–1.9)

## 2018-07-27 LAB — MRSA PCR SCREENING: MRSA by PCR: NEGATIVE

## 2018-07-27 LAB — I-STAT BETA HCG BLOOD, ED (MC, WL, AP ONLY): I-stat hCG, quantitative: <5 m[IU]/mL (ref <5)

## 2018-07-27 LAB — PROCALCITONIN: Procalcitonin: 0.14 ng/mL

## 2018-07-27 LAB — SARS CORONAVIRUS 2 BY RT PCR (HOSPITAL ORDER, PERFORMED IN ~~LOC~~ HOSPITAL LAB): SARS Coronavirus 2: NEGATIVE

## 2018-07-27 MED ORDER — FREE WATER: 300.0000 mL | Freq: Three times a day (TID) | Status: DC

## 2018-07-27 MED ORDER — LORAZEPAM 2 MG/ML IJ SOLN: 0.5000 mg | Freq: Once | INTRAMUSCULAR | Status: DC

## 2018-07-27 MED ORDER — SODIUM CHLORIDE 0.9% FLUSH: 3.0000 mL | Freq: Once | INTRAVENOUS | Status: DC

## 2018-07-27 MED ORDER — FLUCONAZOLE 200 MG PO TABS
200.0000 mg | ORAL_TABLET | Freq: Once | ORAL | Status: DC
  Filled 2018-07-27: qty 1
  Filled 2018-07-27: qty 2

## 2018-07-27 MED ORDER — ACETAMINOPHEN 650 MG RE SUPP: 650.0000 mg | Freq: Four times a day (QID) | RECTAL | Status: DC | PRN

## 2018-07-27 MED ORDER — ALPRAZOLAM 1 MG PO TABS: 1.0000 mg | ORAL_TABLET | Freq: Once | ORAL | Status: DC

## 2018-07-27 MED ORDER — ONDANSETRON HCL 4 MG/2ML IJ SOLN
4.0000 mg | Freq: Four times a day (QID) | INTRAMUSCULAR | Status: DC | PRN
  Administered 2018-07-27 – 2018-08-11 (×2): 4 mg via INTRAVENOUS
  Filled 2018-07-27 (×2): qty 2

## 2018-07-27 MED ORDER — SODIUM CHLORIDE 0.9 % IV BOLUS
1000.0000 mL | Freq: Once | INTRAVENOUS | Status: AC
  Administered 2018-07-27: 1000 mL via INTRAVENOUS

## 2018-07-27 MED ORDER — NYSTATIN 100000 UNIT/ML MT SUSP
5.0000 mL | Freq: Four times a day (QID) | OROMUCOSAL | Status: DC
  Administered 2018-07-27 – 2018-08-10 (×48): 500000 [IU] via ORAL
  Filled 2018-07-27 (×52): qty 5

## 2018-07-27 MED ORDER — VANCOMYCIN HCL 10 G IV SOLR
1500.0000 mg | Freq: Once | INTRAVENOUS | Status: AC
  Administered 2018-07-27: 1500 mg via INTRAVENOUS
  Filled 2018-07-27: qty 1500

## 2018-07-27 MED ORDER — SODIUM CHLORIDE 0.45 % IV SOLN
INTRAVENOUS | Status: DC
  Administered 2018-07-27: 21:00:00 via INTRAVENOUS

## 2018-07-27 MED ORDER — VANCOMYCIN HCL IN DEXTROSE 750-5 MG/150ML-% IV SOLN: 750.0000 mg | INTRAVENOUS | Status: DC

## 2018-07-27 MED ORDER — CHLORHEXIDINE GLUCONATE CLOTH 2 % EX PADS
6.0000 | MEDICATED_PAD | Freq: Every day | CUTANEOUS | Status: DC
  Administered 2018-07-28 – 2018-07-29 (×2): 6 via TOPICAL

## 2018-07-27 MED ORDER — LORAZEPAM 0.5 MG PO TABS: 0.5000 mg | ORAL_TABLET | Freq: Four times a day (QID) | ORAL | Status: DC | PRN

## 2018-07-27 MED ORDER — SODIUM CHLORIDE 0.9 % IV SOLN
1.0000 g | Freq: Once | INTRAVENOUS | Status: AC
  Administered 2018-07-27: 1 g via INTRAVENOUS
  Filled 2018-07-27: qty 10

## 2018-07-27 MED ORDER — SODIUM CHLORIDE 0.9 % IV SOLN
2.0000 g | Freq: Three times a day (TID) | INTRAVENOUS | Status: DC
  Administered 2018-07-27 – 2018-08-01 (×15): 2 g via INTRAVENOUS
  Filled 2018-07-27 (×17): qty 2

## 2018-07-27 MED ORDER — LORAZEPAM 2 MG/ML IJ SOLN
0.5000 mg | Freq: Once | INTRAMUSCULAR | Status: AC
  Administered 2018-07-27: 21:00:00 0.5 mg via INTRAVENOUS
  Filled 2018-07-27: qty 1

## 2018-07-27 MED ORDER — POTASSIUM CHLORIDE CRYS ER 20 MEQ PO TBCR
40.0000 meq | EXTENDED_RELEASE_TABLET | Freq: Once | ORAL | Status: DC
  Filled 2018-07-27: qty 2

## 2018-07-27 MED ORDER — HEPARIN SODIUM (PORCINE) 5000 UNIT/ML IJ SOLN
5000.0000 [IU] | Freq: Three times a day (TID) | INTRAMUSCULAR | Status: DC
  Administered 2018-07-27 – 2018-08-03 (×20): 5000 [IU] via SUBCUTANEOUS
  Filled 2018-07-27 (×21): qty 1

## 2018-07-27 NOTE — Progress Notes (Signed)
Pharmacy Antibiotic Note  Emily Hicks is a 33 y.o. female admitted on 07/27/2018 with sepsis.  Pharmacy has been consulted for vanc/cefepime dosing.  Plan: 1) Vanc 1500mg  x 1 then 750mg  IV q24 - goal AUC 400-550 2) Cefepime 2g IV q8 per current renal function     Temp (24hrs), Avg:102.9 F (39.4 C), Min:102.9 F (39.4 C), Max:102.9 F (39.4 C)  Recent Labs  Lab 07/27/18 1248  WBC 2.5*  CREATININE 1.21*  LATICACIDVEN 2.0*    Estimated Creatinine Clearance: 60.8 mL/min (A) (by C-G formula based on SCr of 1.21 mg/dL (H)).    No Known Allergies   Thank you for allowing pharmacy to be a part of this patient's care.  Kara Mead 07/27/2018 4:04 PM

## 2018-07-27 NOTE — ED Triage Notes (Signed)
Patient dropped off by family.    Patient diagnosed with MS 3 months ago.    Patient was started on medication for MS 2 weeks ago.    Patient went to [facility] last Friday for depo shot and did blood work. Potassium was low and liver enzymes elevated.  Patient started to have decreased appetite Friday   C/o thrush in mouth   Family states patient has been very confused for the last couple of days and want her checked for UTI.    102.9 temp in triage Pulse-147  A/ox4 Wheelchair in triage.

## 2018-07-27 NOTE — ED Notes (Signed)
ED TO INPATIENT HANDOFF REPORT  Name/Age/Gender Emily Hicks 33 y.o. female  Code Status    Code Status Orders  (From admission, onward)         Start     Ordered   07/27/18 1649  Full code  Continuous     07/27/18 1648        Code Status History    This patient has a current code status but no historical code status.   Advance Care Planning Activity      Home/SNF/Other Home  Chief Complaint confused /  off medication   Level of Care/Admitting Diagnosis ED Disposition    ED Disposition Condition Comment   Admit  [facility]ospital Area: [facility] [[id number]]  Level of Care: Stepdown [14]  Admit to SDU based on following criteria: Hemodynamic compromise or significant risk of instability:  Patient requiring short term acute titration and management of vasoactive drips, and invasive monitoring (i.e., CVP and Arterial line).  Covid Evaluation: Screening Protocol (No Symptoms)  Diagnosis: Sepsis [facility](HCC) [[id number]]  Admitting Physician: Burnadette PopADHIKARI, AMRIT [[id number]]  Attending Physician: Burnadette PopADHIKARI, AMRIT [[id number]]  Estimated length of stay: past midnight tomorrow  Certification:: I certify this patient will need inpatient services for at least 2 midnights  PT Class (Do Not Modify): Inpatient [101]  PT Acc Code (Do Not Modify): Private [1]       Medical History Past Medical History:  Diagnosis Date  . Gallstones   . GERD (gastroesophageal reflux disease)    only prn OTC occasionally  . Headache   . Multiple sclerosis (HCC)   . Numbness and tingling of left leg   . Numbness and tingling of right leg     Allergies No Known Allergies  IV Location/Drains/Wounds Patient Lines/Drains/Airways Status   Active Line/Drains/Airways    Name:   Placement date:   Placement time:   Site:   Days:   Peripheral IV 07/27/18 Right Antecubital   07/27/18    1340    Antecubital   less than 1   External Urinary Catheter   07/27/18    1332    -   less than 1   Incision  (Closed) 12/28/13 Abdomen Other (Comment)   12/28/13    1115     1672   Incision - 4 Ports Abdomen 1: Left;Medial;Upper 2: Umbilicus 3: Right;Medial 4: Right;Lateral   12/28/13    1039     1672          Labs/Imaging Results for orders placed or performed during the hospital encounter of 07/27/18 (from the past 48 hour(s))  Lactic acid, plasma     Status: Abnormal   Collection Time: 07/27/18 12:48 PM  Result Value Ref Range   Lactic Acid, Venous 2.0 (HH) 0.5 - 1.9 mmol/L    Comment: CRITICAL RESULT CALLED TO, READ BACK BY AND VERIFIED WITH: C.FRANKLIN AT 1449 ON 07/27/18 BY N.THOMPSON Performed at [facility], [address] [place], [place] [address]   Comprehensive metabolic panel     Status: Abnormal   Collection Time: 07/27/18 12:48 PM  Result Value Ref Range   Sodium 157 (H) 135 - 145 mmol/L   Potassium 3.2 (L) 3.5 - 5.1 mmol/L   Chloride 124 (H) 98 - 111 mmol/L   CO2 23 22 - 32 mmol/L   Glucose, Bld 120 (H) 70 - 99 mg/dL   BUN 26 (H) 6 - 20 mg/dL   Creatinine, Ser 3.081.21 (H) 0.44 - 1.00 mg/dL  Calcium 8.2 (L) 8.9 - 10.3 mg/dL   Total Protein 7.3 6.5 - 8.1 g/dL   Albumin 2.6 (L) 3.5 - 5.0 g/dL   AST 161169 (H) 15 - 41 U/L   ALT 52 (H) 0 - 44 U/L   Alkaline Phosphatase 56 38 - 126 U/L   Total Bilirubin 0.5 0.3 - 1.2 mg/dL   GFR calc non Af Amer 59 (L) >60 mL/min   GFR calc Af Amer >60 >60 mL/min   Anion gap 10 5 - 15    Comment: Performed at [facility], [address] [place], [place] [address]  CBC with Differential     Status: Abnormal   Collection Time: 07/27/18 12:48 PM  Result Value Ref Range   WBC 2.5 (L) 4.0 - 10.5 K/uL    Comment: WHITE COUNT CONFIRMED ON SMEAR   RBC 3.37 (L) 3.87 - 5.11 MIL/uL   Hemoglobin 8.9 (L) 12.0 - 15.0 g/dL   HCT 54.029.8 (L) 98.136.0 - 19.146.0 %   MCV 88.4 80.0 - 100.0 fL   MCH 26.4 26.0 - 34.0 pg   MCHC 29.9 (L) 30.0 - 36.0 g/dL   RDW 47.816.4 (H) 29.511.5 - 62.115.5 %   Platelets 265 150 - 400 K/uL   nRBC 0.0 0.0  - 0.2 %   Neutrophils Relative % 81 %   Neutro Abs 2.0 1.7 - 7.7 K/uL   Lymphocytes Relative 13 %   Lymphs Abs 0.3 (L) 0.7 - 4.0 K/uL   Monocytes Relative 4 %   Monocytes Absolute 0.1 0.1 - 1.0 K/uL   Eosinophils Relative 0 %   Eosinophils Absolute 0.0 0.0 - 0.5 K/uL   Basophils Relative 0 %   Basophils Absolute 0.0 0.0 - 0.1 K/uL   WBC Morphology MORPHOLOGY UNREMARKABLE    Immature Granulocytes 2 %   Abs Immature Granulocytes 0.04 0.00 - 0.07 K/uL    Comment: Performed at [facility], [address] [place], [place] [address]  I-Stat beta hCG blood, ED     Status: None   Collection Time: 07/27/18  1:37 PM  Result Value Ref Range   I-stat hCG, quantitative <5.0 <5 mIU/mL   Comment 3            Comment:   GEST. AGE      CONC.  (mIU/mL)   <=1 WEEK        5 - 50     2 WEEKS       50 - 500     3 WEEKS       100 - 10,000     4 WEEKS     1,000 - 30,000        FEMALE AND NON-PREGNANT FEMALE:     LESS THAN 5 mIU/mL   SARS Coronavirus 2 (CEPHEID - Performed in [facility] hospital lab), Hosp Order     Status: None   Collection Time: 07/27/18  2:34 PM   Specimen: Nasopharyngeal Swab  Result Value Ref Range   SARS Coronavirus 2 NEGATIVE NEGATIVE    Comment: (NOTE) If result is NEGATIVE SARS-CoV-2 target nucleic acids are NOT DETECTED. The SARS-CoV-2 RNA is generally detectable in upper and lower  respiratory specimens during the acute phase of infection. The lowest  concentration of SARS-CoV-2 viral copies this assay can detect is 250  copies / mL. A negative result does not preclude SARS-CoV-2 infection  and should not be used as the sole basis for treatment or other  patient management decisions.  A negative result may occur with  improper specimen collection / handling, submission of specimen other  than nasopharyngeal swab, presence of viral mutation(s) within the  areas targeted by this assay, and inadequate number of viral copies  (<250 copies / mL). A  negative result must be combined with clinical  observations, patient history, and epidemiological information. If result is POSITIVE SARS-CoV-2 target nucleic acids are DETECTED. The SARS-CoV-2 RNA is generally detectable in upper and lower  respiratory specimens dur ing the acute phase of infection.  Positive  results are indicative of active infection with SARS-CoV-2.  Clinical  correlation with patient history and other diagnostic information is  necessary to determine patient infection status.  Positive results do  not rule out bacterial infection or co-infection with other viruses. If result is PRESUMPTIVE POSTIVE SARS-CoV-2 nucleic acids MAY BE PRESENT.   A presumptive positive result was obtained on the submitted specimen  and confirmed on repeat testing.  While 2019 novel coronavirus  (SARS-CoV-2) nucleic acids may be present in the submitted sample  additional confirmatory testing may be necessary for epidemiological  and / or clinical management purposes  to differentiate between  SARS-CoV-2 and other Sarbecovirus currently known to infect humans.  If clinically indicated additional testing with an alternate test  methodology [phone]) is advised. The SARS-CoV-2 RNA is generally  detectable in upper and lower respiratory sp ecimens during the acute  phase of infection. The expected result is Negative. Fact Sheet for Patients:  BoilerBrush.com.cy Fact Sheet for Healthcare Providers: https://pope.com/ This test is not yet approved or cleared by the [place] FDA and has been authorized for detection and/or diagnosis of SARS-CoV-2 by FDA under an Emergency Use Authorization (EUA).  This EUA will remain in effect (meaning this test can be used) for the duration of the COVID-19 declaration under Section 564(b)(1) of the Act, 21 U.S.C. section 360bbb-3(b)(1), unless the authorization is terminated or revoked sooner. Performed  at [facility], [address], [place], [place] [address]    Dg Chest 2 View  Result Date: 07/27/2018 CLINICAL DATA:  Fever and malaise EXAM: CHEST - 2 VIEW COMPARISON:  July 16, 2018 FINDINGS: Lungs are clear. The heart size and pulmonary vascularity are normal. No adenopathy. No bone lesions. IMPRESSION: No edema or consolidation. Electronically Signed   By: Bretta Bang III M.D.   On: 07/27/2018 15:00    Pending Labs Unresulted Labs (From admission, onward)    Start     Ordered   07/28/18 0500  Creatinine, serum  Daily,   R     07/27/18 1606   07/28/18 0500  Comprehensive metabolic panel  Tomorrow morning,   R     07/27/18 1648   07/28/18 0500  CBC  Tomorrow morning,   R     07/27/18 1648   07/28/18 0500  Procalcitonin  Daily,   R     07/27/18 1717   07/27/18 1718  Hepatitis panel, acute  Add-on,   AD     07/27/18 1717   07/27/18 1718  Procalcitonin - Baseline  ONCE - STAT,   STAT     07/27/18 1717   07/27/18 1507  Urine culture  ONCE - STAT,   STAT     07/27/18 1506   07/27/18 1404  Blood Culture (routine x 2)  BLOOD CULTURE X 2,   STAT     07/27/18 1403   07/27/18 1248  Lactic acid, plasma  Now then  every 2 hours,   STAT     07/27/18 1247   07/27/18 1248  Urinalysis, Routine w reflex microscopic  ONCE - STAT,   STAT     07/27/18 1247          Vitals/Pain Today's Vitals   07/27/18 1241 07/27/18 1242 07/27/18 1500 07/27/18 1700  BP: (!) 100/58  114/67 119/76  Pulse: (!) 142  (!) 135 (!) 124  Resp: 18  (!) 25 18  Temp: (!) 102.9 F (39.4 C)     TempSrc: Oral     SpO2: 98% 99% 100% 100%  PainSc:  0-No pain      Isolation Precautions No active isolations  Medications Medications  sodium chloride flush (NS) 0.9 % injection 3 mL (has no administration in time range)  vancomycin (VANCOCIN) 1,500 mg in sodium chloride 0.9 % 500 mL IVPB (1,500 mg Intravenous New Bag/Given 07/27/18 1651)  vancomycin (VANCOCIN) IVPB 750 mg/150 ml premix (has  no administration in time range)  ceFEPIme (MAXIPIME) 2 g in sodium chloride 0.9 % 100 mL IVPB (has no administration in time range)  heparin injection 5,000 Units (has no administration in time range)  0.45 % sodium chloride infusion (has no administration in time range)  free water 300 mL (has no administration in time range)  fluconazole (DIFLUCAN) tablet 200 mg (has no administration in time range)  nystatin (MYCOSTATIN) 100000 UNIT/ML suspension 500,000 Units (has no administration in time range)  LORazepam (ATIVAN) tablet 0.5 mg (has no administration in time range)  potassium chloride SA (K-DUR) CR tablet 40 mEq (has no administration in time range)  sodium chloride 0.9 % bolus 1,000 mL (1,000 mLs Intravenous New Bag/Given 07/27/18 1431)  sodium chloride 0.9 % bolus 1,000 mL (0 mLs Intravenous Stopped 07/27/18 1642)  cefTRIAXone (ROCEPHIN) 1 g in sodium chloride 0.9 % 100 mL IVPB (0 g Intravenous Stopped 07/27/18 1642)    Mobility non-ambulatory

## 2018-07-27 NOTE — ED Provider Notes (Signed)
[facility] Emergency Department Provider Note MRN:  [id number]  Arrival date & time: 07/27/18     Chief Complaint   Fever   History of Present Illness   Emily Hicks is a 33 y.o. year-old female with a history of multiple sclerosis presenting to the ED with chief complaint of fever.  Patient reports increased confusion earlier this morning, family tells her that she was seeing things that were not really there.  Patient is feeling generally weak, malaise.  Denies headache, no nausea, no vomiting, no chest pain, no shortness of breath, no abdominal pain.  Patient has a fever here in the emergency department.  Review of Systems  A complete 10 system review of systems was obtained and all systems are negative except as noted in the HPI and PMH.   Patient's Health History    Past Medical History:  Diagnosis Date  . Gallstones   . GERD (gastroesophageal reflux disease)    only prn OTC occasionally  . Headache   . Multiple sclerosis ([place])   . Numbness and tingling of left leg   . Numbness and tingling of right leg     Past Surgical History:  Procedure Laterality Date  . CHOLECYSTECTOMY N/A 12/28/2013   Procedure: LAPAROSCOPIC CHOLECYSTECTOMY;  Surgeon: Fanny Skates, MD;  Location: [facility] ORS;  Service: General;  Laterality: N/A;    Family History  Problem Relation Age of Onset  . Hypertension Father   . Hypertension Mother   . Hypertension Sister   . Diabetes Maternal Grandmother   . Diabetes Maternal Grandfather     Social History   Socioeconomic History  . Marital status: Single    Spouse name: Not on file  . Number of children: 1  . Years of education: 65  . Highest education level: Bachelor's degree (e.g., BA, AB, BS)  Occupational History  . Occupation: Press photographer  Social Needs  . Financial resource strain: Not on file  . Food insecurity    Worry: Not on file    Inability: Not on file  . Transportation needs    Medical: Not on file   Non-medical: Not on file  Tobacco Use  . Smoking status: Never Smoker  . Smokeless tobacco: Never Used  Substance and Sexual Activity  . Alcohol use: No  . Drug use: No  . Sexual activity: Not Currently  Lifestyle  . Physical activity    Days per week: Not on file    Minutes per session: Not on file  . Stress: Not on file  Relationships  . Social Herbalist on phone: Not on file    Gets together: Not on file    Attends religious service: Not on file    Active member of club or organization: Not on file    Attends meetings of clubs or organizations: Not on file    Relationship status: Not on file  . Intimate partner violence    Fear of current or ex partner: Not on file    Emotionally abused: Not on file    Physically abused: Not on file    Forced sexual activity: Not on file  Other Topics Concern  . Not on file  Social History Narrative   Lives at home with husband and son.   Right-handed.   No caffeine use.     Physical Exam  Vital Signs and Nursing Notes reviewed Vitals:   07/27/18 1241 07/27/18 1242  BP: (!) 100/58   Pulse: Marland Kitchen)  142   Resp: 18   Temp: (!) 102.9 F (39.4 C)   SpO2: 98% 99%    CONSTITUTIONAL: Well-appearing, NAD NEURO:  Alert and oriented x 3, no focal deficits, no meningismus EYES:  eyes equal and reactive ENT/NECK:  no LAD, no JVD CARDIO: Tachycardic rate, well-perfused, normal S1 and S2 PULM:  CTAB no wheezing or rhonchi GI/GU:  normal bowel sounds, non-distended, non-tender MSK/SPINE:  No gross deformities, no edema SKIN:  no rash, atraumatic PSYCH:  Appropriate speech and behavior  Diagnostic and Interventional Summary    EKG Interpretation  Date/Time:  Tuesday July 27 2018 13:23:56 EDT Ventricular Rate:  134 PR Interval:    QRS Duration: 80 QT Interval:  284 QTC Calculation: 424 R Axis:   72 Text Interpretation:  Sinus tachycardia Confirmed by Kennis Carina [phone]) on 07/27/2018 2:46:26 PM      Labs Reviewed   COMPREHENSIVE METABOLIC PANEL - Abnormal; Notable for the following components:      Result Value   Sodium 157 (*)    Potassium 3.2 (*)    Chloride 124 (*)    Glucose, Bld 120 (*)    BUN 26 (*)    Creatinine, Ser 1.21 (*)    Calcium 8.2 (*)    Albumin 2.6 (*)    AST 169 (*)    ALT 52 (*)    GFR calc non Af Amer 59 (*)    All other components within normal limits  CBC WITH DIFFERENTIAL/PLATELET - Abnormal; Notable for the following components:   WBC 2.5 (*)    RBC 3.37 (*)    Hemoglobin 8.9 (*)    HCT 29.8 (*)    MCHC 29.9 (*)    RDW 16.4 (*)    Lymphs Abs 0.3 (*)    All other components within normal limits  CULTURE, BLOOD (ROUTINE X 2)  CULTURE, BLOOD (ROUTINE X 2)  SARS CORONAVIRUS 2 (HOSPITAL ORDER, PERFORMED IN [facility] LAB)  LACTIC ACID, PLASMA  LACTIC ACID, PLASMA  URINALYSIS, ROUTINE W REFLEX MICROSCOPIC  I-STAT BETA HCG BLOOD, ED ([facility], [facility], [facility] ONLY)    DG Chest 2 View    (Results Pending)    Medications  sodium chloride flush (NS) 0.9 % injection 3 mL (has no administration in time range)  cefTRIAXone (ROCEPHIN) 1 g in sodium chloride 0.9 % 100 mL IVPB (has no administration in time range)  sodium chloride 0.9 % bolus 1,000 mL (1,000 mLs Intravenous New Bag/Given 07/27/18 1431)  sodium chloride 0.9 % bolus 1,000 mL (1,000 mLs Intravenous New Bag/Given 07/27/18 1431)     Procedures Critical Care Critical Care Documentation Critical care time provided by me (excluding procedures): 34 minutes  Condition necessitating critical care: Severe hypernatremia  Components of critical care management: reviewing of prior records, laboratory and imaging interpretation, frequent re-examination and reassessment of vital signs, administration of IV fluids, discussion with consulting services    ED Course and Medical Decision Making  I have reviewed the triage vital signs and the nursing notes.  Pertinent labs & imaging results that were available during my care  of the patient were reviewed by me and considered in my medical decision making (see below for details).  Fever of unclear source in this 33 year old female with newly diagnosed MS, on a new MS medication.  Report of confusion and hallucinations prior to arrival.  Patient exhibits no signs or symptoms of meningitis currently.  Early blood work reveals a sodium of 157, which can explain patient's  altered mental status.  Given the fever and tachycardia, code sepsis initiated, providing fluids and antibiotics, plan for admission.    Elmer SowMichael M. Pilar PlateBero, MD [facility] [email or web address]  Final Clinical Impressions(s) / ED Diagnoses     ICD-10-CM   1. Fever, unspecified fever cause  R50.9   2. Hypernatremia  E87.0     ED Discharge Orders    None         Sabas SousBero, Michael M, MD 07/27/18 1447

## 2018-07-27 NOTE — Sepsis Progress Note (Signed)
Notified bedside nurse of need to draw repeat lactic acid .  States lactic acid was drawn in ED, Talked with lab, states never did receive blood draw. Apparently some type of miscommunication.

## 2018-07-27 NOTE — ED Notes (Signed)
Pure wick has been placed. Suction set to 45mmHg.  

## 2018-07-27 NOTE — H&P (Addendum)
History and Physical    Emily DublinJamie J Hicks WUJ:[id number] DOB: 1985-06-01 DOA: 07/27/2018  PCP: Leland HerYoo, Elsia J, DO   Patient coming from: Home    Chief Complaint: Confusion, weakness, loss of appetite, fever  HPI: Emily Hicks is a 33 y.o. female with medical history significant of multiple sclerosis, depression who was brought by her husband to the emergency department for the evaluation of altered mental status, fever, generalized weakness.  All information was taken from the patient and her husband on phone.  As per the husband, patient has been increasingly weak since last week.  She has very poor appetite.  She has not eaten anything.  She has been confused  and has been acting differently.  Patient has been diagnosed with multiple sclerosis about 3 months ago.  She follows with neurology Dr. Terrace ArabiaYan.  She is usually able to walk with support.  She works as an Airline pilotaccountant.  Her husband drives her to the work . Husband states that she has steadily going downhill since last few weeks.  She is having gradual increasing weakness, loss of appetite.  They have been trying hard to feed her but she throws up.  They have noticed that she might had fever at home. Patient seen and examined the bedside in the emergency department.  She was found to be tachycardic during my evaluation.  Looks very weak, drowsy but alert and oriented x4. She denied any chest pain, cough, shortness of breath, abdominal pain, nausea, vomiting, dysuria.  ED Course: ED work-up revealed sodium of 157, potassium of 3.2.  She has acute kidney injury with creatinine of 1.2.  Mild elevated liver enzymes.  Lactate of 2.  She has leukopenia of 2.5.  Hemoglobin of 8.9.  She had fever of 102.90 Fahrenheit on presentation We were asked to admit for the evaluation of altered mental status. Covid-19 screening test negative.  Review of Systems: As per HPI otherwise 10 point review of systems negative.    Past Medical History:  Diagnosis Date   . Gallstones   . GERD (gastroesophageal reflux disease)    only prn OTC occasionally  . Headache   . Multiple sclerosis (HCC)   . Numbness and tingling of left leg   . Numbness and tingling of right leg     Past Surgical History:  Procedure Laterality Date  . CHOLECYSTECTOMY N/A 12/28/2013   Procedure: LAPAROSCOPIC CHOLECYSTECTOMY;  Surgeon: Claud KelpHaywood Ingram, MD;  Location: [facility];  Service: General;  Laterality: N/A;     reports that she has never smoked. She has never used smokeless tobacco. She reports that she does not drink alcohol or use drugs.  No Known Allergies  Family History  Problem Relation Age of Onset  . Hypertension Father   . Hypertension Mother   . Hypertension Sister   . Diabetes Maternal Grandmother   . Diabetes Maternal Grandfather      Prior to Admission medications   Medication Sig Start Date End Date Taking? Authorizing Provider  DULoxetine (CYMBALTA) 60 MG capsule Take 1 capsule (60 mg total) by mouth daily. 06/01/18  Yes Levert FeinsteinYan, Yijun, MD  ibuprofen (ADVIL) 200 MG tablet Take 800 mg by mouth every 6 (six) hours as needed for mild pain.   Yes [provider]  medroxyPROGESTERone (DEPO-PROVERA) 150 MG/ML injection Inject 1 mL (150 mg total) into the muscle every 3 (three) months. 02/11/11  Yes Ozella RocksMerrell, David J, MD  mirtazapine (REMERON) 15 MG tablet Take 1 tablet (15 mg total) by mouth  at bedtime. 07/06/18  Yes Marcial Pacas, MD  ALPRAZolam Duanne Moron) 1 MG tablet Take 1-2 tablets thirty minutes prior to MRI.  May take one additional tablet before entering scanner, if needed.  MUST HAVE DRIVER. Patient not taking: Reported on 07/16/2018 03/16/18   Marcial Pacas, MD  naproxen (NAPROSYN) 500 MG tablet Take 1 tablet (500 mg total) by mouth 2 (two) times daily. Patient not taking: Reported on 07/27/2018 06/25/18   Valarie Merino, MD    Physical Exam: Vitals:   07/27/18 1241 07/27/18 1242 07/27/18 1500  BP: (!) 100/58  114/67  Pulse: (!) 142  (!) 135  Resp: 18   (!) 25  Temp: (!) 102.9 F (39.4 C)    TempSrc: Oral    SpO2: 98% 99% 100%    Constitutional: Generalized weakness Vitals:   07/27/18 1241 07/27/18 1242 07/27/18 1500  BP: (!) 100/58  114/67  Pulse: (!) 142  (!) 135  Resp: 18  (!) 25  Temp: (!) 102.9 F (39.4 C)    TempSrc: Oral    SpO2: 98% 99% 100%   Eyes: PERRL, lids and conjunctivae normal ENMT: Mucous membranes are moist.Oral thrush Neck: normal, supple, no masses, no thyromegaly Respiratory: clear to auscultation bilaterally, no wheezing, no crackles. Normal respiratory effort. No accessory muscle use.  Cardiovascular: Regular rate and rhythm, no murmurs / rubs / gallops. No extremity edema. 2+ pedal pulses. No carotid bruits.  Abdomen: no tenderness, no masses palpated. No hepatosplenomegaly. Bowel sounds positive.  Musculoskeletal: no clubbing / cyanosis. No joint deformity upper and lower extremities. Good ROM, no contractures. Normal muscle tone.  Skin: no rashes, lesions, ulcers. No induration Neurologic: CN 2-12 grossly intact. Sensation intact Psychiatric: Slow to respond and weak but  Alert and oriented x 3.  Foley Catheter:None  Labs on Admission: I have personally reviewed following labs and imaging studies  CBC: Recent Labs  Lab 07/27/18 1248  WBC 2.5*  NEUTROABS 2.0  HGB 8.9*  HCT 29.8*  MCV 88.4  PLT 382   Basic Metabolic Panel: Recent Labs  Lab 07/27/18 1248  NA 157*  K 3.2*  CL 124*  CO2 23  GLUCOSE 120*  BUN 26*  CREATININE 1.21*  CALCIUM 8.2*   GFR: Estimated Creatinine Clearance: 60.8 mL/min (A) (by C-G formula based on SCr of 1.21 mg/dL (H)). Liver Function Tests: Recent Labs  Lab 07/27/18 1248  AST 169*  ALT 52*  ALKPHOS 56  BILITOT 0.5  PROT 7.3  ALBUMIN 2.6*   No results for input(s): LIPASE, AMYLASE in the last 168 hours. No results for input(s): AMMONIA in the last 168 hours. Coagulation Profile: No results for input(s): INR, PROTIME in the last 168 hours. Cardiac  Enzymes: No results for input(s): CKTOTAL, CKMB, CKMBINDEX, TROPONINI in the last 168 hours. BNP (last 3 results) No results for input(s): PROBNP in the last 8760 hours. HbA1C: No results for input(s): HGBA1C in the last 72 hours. CBG: No results for input(s): GLUCAP in the last 168 hours. Lipid Profile: No results for input(s): CHOL, HDL, LDLCALC, TRIG, CHOLHDL, LDLDIRECT in the last 72 hours. Thyroid Function Tests: No results for input(s): TSH, T4TOTAL, FREET4, T3FREE, THYROIDAB in the last 72 hours. Anemia Panel: No results for input(s): VITAMINB12, FOLATE, FERRITIN, TIBC, IRON, RETICCTPCT in the last 72 hours. Urine analysis:    Component Value Date/Time   COLORURINE AMBER (A) 07/16/2018 1700   APPEARANCEUR HAZY (A) 07/16/2018 1700   LABSPEC 1.024 07/16/2018 1700   PHURINE 6.0 07/16/2018 1700  GLUCOSEU NEGATIVE 07/16/2018 1700   HGBUR NEGATIVE 07/16/2018 1700   BILIRUBINUR NEGATIVE 07/16/2018 1700   BILIRUBINUR Neg 05/08/2010 1553   KETONESUR NEGATIVE 07/16/2018 1700   PROTEINUR 100 (A) 07/16/2018 1700   UROBILINOGEN 1.0 11/11/2013 0150   NITRITE NEGATIVE 07/16/2018 1700   LEUKOCYTESUR TRACE (A) 07/16/2018 1700    Radiological Exams on Admission: Dg Chest 2 View  Result Date: 07/27/2018 CLINICAL DATA:  Fever and malaise EXAM: CHEST - 2 VIEW COMPARISON:  July 16, 2018 FINDINGS: Lungs are clear. The heart size and pulmonary vascularity are normal. No adenopathy. No bone lesions. IMPRESSION: No edema or consolidation. Electronically Signed   By: Bretta BangWilliam  Woodruff III M.D.   On: 07/27/2018 15:00     Assessment/Plan Principal Problem:   Sepsis (HCC) Active Problems:   MS (multiple sclerosis) (HCC)   Tachycardia   Leukopenia   AKI (acute kidney injury) (HCC)   Hypernatremia   Altered mental status   Fever   AMS (altered mental status)    Suspected sepsis:Febrile on presentation.  No clear source of sepsis at present.Chest x-ray did not show pneumonia.  Her  abdomen is soft and nontender.  Patient denies any dysuria.  Started on broad-spectrum antibiotics.  We will follow-up cultures.  Please consider de-escalating or stopping the antibiotics as soon as appropriate.  Will check procalcitonin.  Lactic acid elevated in the range of 2 on presentation  Altered mental status: Unclear source but could be associated  with hypernatremia.  Sodium of 157 on presentation.  Low suspicion for encephalitis /meningitis.  Patient does not have any neck rigidity.  Denies any headache.  Lumbar puncture not obtained in the emergency department. Will get MRI of the brain.  If her mental status does not improve in the next 24 to 48 hours, we can consider a  lumbar puncture. We also have to R/O new MS lesions on her brain. I have requested for neurological evaluation.  Discussed with Dr.Aroor , who said he will follow-up the MRI report and consult if necessary.  Hypernatremia: Sodium of 157.  Most likely history with dehydration.  Continue half-normal saline.Continue free water.  She was bolused with 2 L of normal saline on presentation.  Continue to monitor BMP.  Acute kidney injury: Most likely secondary to dehydration.  Patient was having very poor oral intake at home.  Check BMP tomorrow.  Leukopenia: Has chronic leukopenia of unknown etiology.  Continue to monitor.  Can be worked up as an outpatient.  Multiple sclerosis: Diagnosed about 3 months ago.  Follows with Dr. Terrace ArabiaYan.  Plan is to start on Aubagio.  Elevated liver enzymes: AST more than ALT.  Will check hepatitis panel.  Previous hepatitis panel were negative.  Generalized weakness/deconditioning/debility: We will request for physical therapy evaluation.  Hypokalemia: We will supplement with potassium.  Normocytic anemia: Hemoglobin of 8.9.  No history of melena or bloody stools.  Hemoglobin has been stable since last checked in 07/16/2018  Oral thrush: We will give a dose of fluconazole and start on nystatin.   Depression: On mirtazapine and Cymbalta at home which we will hold for now.     Severity of Illness: The appropriate patient status for this patient is INPATIENT.Has severe dehydration, hyponatremia, fever.  Needs IV antibiotics needs close monitoring.    DVT prophylaxis: Heparin Code Status: Full Family Communication: Husband Consults called: Neurology     Burnadette PopAmrit Ketih Goodie MD [facility] Pager [phone]  If 7PM-7AM, please contact night-coverage [email or web address] Password [email or web address]  07/27/2018, 4:59  PM    

## 2018-07-28 DIAGNOSIS — A419 Sepsis, unspecified organism: Principal | ICD-10-CM

## 2018-07-28 DIAGNOSIS — E870 Hyperosmolality and hypernatremia: Secondary | ICD-10-CM

## 2018-07-28 DIAGNOSIS — R1310 Dysphagia, unspecified: Secondary | ICD-10-CM

## 2018-07-28 DIAGNOSIS — G9341 Metabolic encephalopathy: Secondary | ICD-10-CM

## 2018-07-28 DIAGNOSIS — E876 Hypokalemia: Secondary | ICD-10-CM

## 2018-07-28 DIAGNOSIS — R6520 Severe sepsis without septic shock: Secondary | ICD-10-CM

## 2018-07-28 DIAGNOSIS — N179 Acute kidney failure, unspecified: Secondary | ICD-10-CM

## 2018-07-28 LAB — COMPREHENSIVE METABOLIC PANEL
ALT: 48 U/L — ABNORMAL HIGH (ref 0–44)
AST: 142 U/L — ABNORMAL HIGH (ref 15–41)
Albumin: 2.1 g/dL — ABNORMAL LOW (ref 3.5–5.0)
Alkaline Phosphatase: 47 U/L (ref 38–126)
BUN: 20 mg/dL (ref 6–20)
CO2: 21 mmol/L — ABNORMAL LOW (ref 22–32)
Calcium: 7.3 mg/dL — ABNORMAL LOW (ref 8.9–10.3)
Chloride: >130 mmol/L (ref 98–111)
Creatinine, Ser: 0.94 mg/dL (ref 0.44–1.00)
GFR calc Af Amer: >60 mL/min (ref >60)
GFR calc non Af Amer: >60 mL/min (ref >60)
Glucose, Bld: 123 mg/dL — ABNORMAL HIGH (ref 70–99)
Potassium: 3 mmol/L — ABNORMAL LOW (ref 3.5–5.1)
Sodium: 157 mmol/L — ABNORMAL HIGH (ref 135–145)
Total Bilirubin: 0.3 mg/dL (ref 0.3–1.2)
Total Protein: 6 g/dL — ABNORMAL LOW (ref 6.5–8.1)

## 2018-07-28 LAB — URINE CULTURE: Culture: NO GROWTH

## 2018-07-28 LAB — CBC
HCT: 25.5 % — ABNORMAL LOW (ref 36.0–46.0)
Hemoglobin: 7.7 g/dL — ABNORMAL LOW (ref 12.0–15.0)
MCH: 27.5 pg (ref 26.0–34.0)
MCHC: 30.2 g/dL (ref 30.0–36.0)
MCV: 91.1 fL (ref 80.0–100.0)
Platelets: 192 10*3/uL (ref 150–400)
RBC: 2.8 MIL/uL — ABNORMAL LOW (ref 3.87–5.11)
RDW: 16.8 % — ABNORMAL HIGH (ref 11.5–15.5)
WBC: 5.3 10*3/uL (ref 4.0–10.5)
nRBC: 0.4 % — ABNORMAL HIGH (ref 0.0–0.2)

## 2018-07-28 LAB — C-REACTIVE PROTEIN: CRP: 4.7 mg/dL — ABNORMAL HIGH (ref <1.0)

## 2018-07-28 LAB — LACTIC ACID, PLASMA: Lactic Acid, Venous: 1.8 mmol/L (ref 0.5–1.9)

## 2018-07-28 LAB — D-DIMER, QUANTITATIVE: D-Dimer, Quant: 13.95 ug/mL-FEU — ABNORMAL HIGH (ref 0.00–0.50)

## 2018-07-28 LAB — PROCALCITONIN: Procalcitonin: 0.16 ng/mL

## 2018-07-28 LAB — FERRITIN: Ferritin: 1982 ng/mL — ABNORMAL HIGH (ref 11–307)

## 2018-07-28 MED ORDER — FREE WATER
300.0000 mL | Freq: Three times a day (TID) | Status: DC
  Administered 2018-07-28 – 2018-07-29 (×3): 300 mL via ORAL
  Administered 2018-07-30: 150 mL via ORAL
  Administered 2018-07-30 – 2018-08-14 (×20): 300 mL via ORAL

## 2018-07-28 MED ORDER — LACTATED RINGERS IV SOLN
INTRAVENOUS | Status: DC
  Administered 2018-07-28 – 2018-07-29 (×2): via INTRAVENOUS

## 2018-07-28 MED ORDER — LIDOCAINE VISCOUS HCL 2 % MT SOLN
15.0000 mL | Freq: Three times a day (TID) | OROMUCOSAL | Status: DC
  Administered 2018-07-28 – 2018-08-11 (×37): 15 mL via OROMUCOSAL
  Filled 2018-07-28 (×47): qty 15

## 2018-07-28 MED ORDER — BOOST / RESOURCE BREEZE PO LIQD CUSTOM
1.0000 | Freq: Two times a day (BID) | ORAL | Status: DC
  Administered 2018-07-28 – 2018-08-03 (×7): 1 via ORAL

## 2018-07-28 MED ORDER — ADULT MULTIVITAMIN W/MINERALS CH
1.0000 | ORAL_TABLET | Freq: Every day | ORAL | Status: DC
  Administered 2018-07-29: 10:00:00 1 via ORAL
  Filled 2018-07-28 (×4): qty 1

## 2018-07-28 MED ORDER — LACTATED RINGERS IV BOLUS
1000.0000 mL | Freq: Once | INTRAVENOUS | Status: AC
  Administered 2018-07-28: 1000 mL via INTRAVENOUS

## 2018-07-28 MED ORDER — ENSURE ENLIVE PO LIQD
237.0000 mL | Freq: Two times a day (BID) | ORAL | Status: DC
  Administered 2018-07-28 – 2018-08-04 (×8): 237 mL via ORAL

## 2018-07-28 MED ORDER — FLUCONAZOLE IN SODIUM CHLORIDE 200-0.9 MG/100ML-% IV SOLN
200.0000 mg | INTRAVENOUS | Status: DC
  Administered 2018-07-28 – 2018-08-02 (×6): 200 mg via INTRAVENOUS
  Filled 2018-07-28 (×6): qty 100

## 2018-07-28 NOTE — Progress Notes (Addendum)
PROGRESS NOTE  Emily Hicks DXA:[id number] DOB: 1985-09-19 DOA: 07/27/2018 PCP: Bufford Lope, DO  HPI/Recap of past 24 hours:  Odynophagia, fever, tachycardia, no cough, no hypoxia Denies headache, no neck pain or rigidity   Assessment/Plan: Principal Problem:   Sepsis ([place]) Active Problems:   MS (multiple sclerosis) (HCC)   Tachycardia   Leukopenia   AKI (acute kidney injury) ([place])   Hypernatremia   Altered mental status   Fever   AMS (altered mental status)   Sepsis /acute metabolic encephalopathy likely due to oropharyngeal candidiasis + esophageal candida --mrsa screening negative, SARS cov2 screening negative, Blood culture negative, urine culture negative, cxr no acute infiltrate, she denies headache, no nuchal rigidity, mri brain no acute findings Start iv diflucan, topical nystatin  -D/c vanc, continue cefepime -hiv/hsv/rpr/ebv/cmv in process -Case discussed with GI who will consult   Anemia:  no overt sign of bleeding Possible due to aubagio Monitor  Transfuse if hgb less than 7  Hypernatremia: Likely due to dehydration, ivf and oral free water Repeat bmp  AKI Likely prerenal  Continue hydration, cr improving  Hypokalemia: replace k, check mag  abnormal lft Side effect from [place]? Monitor, liver US in persists GI following  H/O MS -stopped Aubagio a week ago due to cytopenia and abnormal liver function -Mri brain no acute findings -Per neurology, likely pseudo exacerbation due to acute illness ( sepsis)   Generalized weakness PT eval  Code Status: full  Family Communication: patient   Disposition Plan: remain in stepdown   Consultants:  GI  Procedures:  none  Antibiotics:  vanc x1 on presentation  Cefepime from presentation   Objective: BP 107/81   Pulse (!) 135   Temp (!) 102.6 F (39.2 C) (Oral) Comment (Src): RN notified.   Resp (!) 23   Ht 5\' 1"  (1.549 m)   Wt 83.3 kg   SpO2 96%   BMI 34.70 kg/m    Intake/Output Summary (Last 24 hours) at 07/28/2018 2094 Last data filed at 07/28/2018 0900 Gross per 24 hour  Intake 2241.83 ml  Output 200 ml  Net 2041.83 ml   Filed Weights   07/27/18 1745  Weight: 83.3 kg    Exam: Patient is examined daily including today on 07/28/2018, exams remain the same as of yesterday except that has changed    General:  Weak, diffuse oropharygeal thrush , on tongue, on palate   Cardiovascular: sinus tachycardia  Respiratory: CTABL  Abdomen: Soft/ND/NT, positive BS  Musculoskeletal: No Edema  Neuro: alert, appear slow in answering questions but oriented x3,   Data Reviewed: Basic Metabolic Panel: Recent Labs  Lab 07/27/18 1248  NA 157*  K 3.2*  CL 124*  CO2 23  GLUCOSE 120*  BUN 26*  CREATININE 1.21*  CALCIUM 8.2*   Liver Function Tests: Recent Labs  Lab 07/27/18 1248  AST 169*  ALT 52*  ALKPHOS 56  BILITOT 0.5  PROT 7.3  ALBUMIN 2.6*   No results for input(s): LIPASE, AMYLASE in the last 168 hours. No results for input(s): AMMONIA in the last 168 hours. CBC: Recent Labs  Lab 07/27/18 1248  WBC 2.5*  NEUTROABS 2.0  HGB 8.9*  HCT 29.8*  MCV 88.4  PLT 265   Cardiac Enzymes:   No results for input(s): CKTOTAL, CKMB, CKMBINDEX, TROPONINI in the last 168 hours. BNP (last 3 results) No results for input(s): BNP in the last 8760 hours.  ProBNP (last 3 results) No results for input(s): PROBNP in the  last 8760 hours.  CBG: No results for input(s): GLUCAP in the last 168 hours.  Recent Results (from the past 240 hour(s))  SARS Coronavirus 2 (CEPHEID - Performed in [facility] hospital lab), Hosp Order     Status: None   Collection Time: 07/27/18  2:34 PM   Specimen: Nasopharyngeal Swab  Result Value Ref Range Status   SARS Coronavirus 2 NEGATIVE NEGATIVE Final    Comment: (NOTE) If result is NEGATIVE SARS-CoV-2 target nucleic acids are NOT DETECTED. The SARS-CoV-2 RNA is generally detectable in upper and lower   respiratory specimens during the acute phase of infection. The lowest  concentration of SARS-CoV-2 viral copies this assay can detect is 250  copies / mL. A negative result does not preclude SARS-CoV-2 infection  and should not be used as the sole basis for treatment or other  patient management decisions.  A negative result may occur with  improper specimen collection / handling, submission of specimen other  than nasopharyngeal swab, presence of viral mutation(s) within the  areas targeted by this assay, and inadequate number of viral copies  (<250 copies / mL). A negative result must be combined with clinical  observations, patient history, and epidemiological information. If result is POSITIVE SARS-CoV-2 target nucleic acids are DETECTED. The SARS-CoV-2 RNA is generally detectable in upper and lower  respiratory specimens dur ing the acute phase of infection.  Positive  results are indicative of active infection with SARS-CoV-2.  Clinical  correlation with patient history and other diagnostic information is  necessary to determine patient infection status.  Positive results do  not rule out bacterial infection or co-infection with other viruses. If result is PRESUMPTIVE POSTIVE SARS-CoV-2 nucleic acids MAY BE PRESENT.   A presumptive positive result was obtained on the submitted specimen  and confirmed on repeat testing.  While 2019 novel coronavirus  (SARS-CoV-2) nucleic acids may be present in the submitted sample  additional confirmatory testing may be necessary for epidemiological  and / or clinical management purposes  to differentiate between  SARS-CoV-2 and other Sarbecovirus currently known to infect humans.  If clinically indicated additional testing with an alternate test  methodology [phone]) is advised. The SARS-CoV-2 RNA is generally  detectable in upper and lower respiratory sp ecimens during the acute  phase of infection. The expected result is Negative. Fact  Sheet for Patients:  BoilerBrush.com.cy Fact Sheet for Healthcare Providers: https://pope.com/ This test is not yet approved or cleared by the [place] FDA and has been authorized for detection and/or diagnosis of SARS-CoV-2 by FDA under an Emergency Use Authorization (EUA).  This EUA will remain in effect (meaning this test can be used) for the duration of the COVID-19 declaration under Section 564(b)(1) of the Act, 21 U.S.C. section 360bbb-3(b)(1), unless the authorization is terminated or revoked sooner. Performed at [facility], [address] [place], [place] [address]   MRSA PCR Screening     Status: None   Collection Time: 07/27/18  5:39 PM   Specimen: Nasal Mucosa; Nasopharyngeal  Result Value Ref Range Status   MRSA by PCR NEGATIVE NEGATIVE Final    Comment:        The GeneXpert MRSA Assay (FDA approved for NASAL specimens only), is one component of a comprehensive MRSA colonization surveillance program. It is not intended to diagnose MRSA infection nor to guide or monitor treatment for MRSA infections. Performed at [facility], [address], [place], [place] [address]  Studies: Dg Chest 2 View  Result Date: 07/27/2018 CLINICAL DATA:  Fever and malaise EXAM: CHEST - 2 VIEW COMPARISON:  July 16, 2018 FINDINGS: Lungs are clear. The heart size and pulmonary vascularity are normal. No adenopathy. No bone lesions. IMPRESSION: No edema or consolidation. Electronically Signed   By: Bretta BangWilliam  Woodruff III M.D.   On: 07/27/2018 15:00   Mr Brain Wo Contrast  Result Date: 07/28/2018 CLINICAL DATA:  Encephalopathy with hallucinations EXAM: MRI HEAD WITHOUT CONTRAST TECHNIQUE: Multiplanar, multiecho pulse sequences of the brain and surrounding structures were obtained without intravenous contrast. COMPARISON:  Brain MRI 03/24/2018 FINDINGS: BRAIN: There is no acute infarct, acute  hemorrhage or extra-axial collection. The midline structures are normal. The white matter signal is normal for the patient's age. The cerebral and cerebellar volume are age-appropriate. No hydrocephalus. Susceptibility-sensitive sequences show no chronic microhemorrhage or superficial siderosis. No midline shift or other mass effect. VASCULAR: The major intracranial arterial and venous sinus flow voids are normal. SKULL AND UPPER CERVICAL SPINE: Calvarial bone marrow signal is normal. There is no skull base mass. Visualized upper cervical spine and soft tissues are normal. SINUSES/ORBITS: No fluid levels or advanced mucosal thickening. No mastoid or middle ear effusion. The orbits are normal. IMPRESSION: Normal brain. Electronically Signed   By: Deatra RobinsonKevin  Herman M.D.   On: 07/28/2018 00:13    Scheduled Meds: . Chlorhexidine Gluconate Cloth  6 each Topical Q0600  . feeding supplement  1 Container Oral BID BM  . feeding supplement (ENSURE ENLIVE)  237 mL Oral BID BM  . fluconazole  200 mg Oral Once  . free water  300 mL Per Tube Q8H  . heparin  5,000 Units Subcutaneous Q8H  . multivitamin with minerals  1 tablet Oral Daily  . nystatin  5 mL Oral QID  . potassium chloride  40 mEq Oral Once  . sodium chloride flush  3 mL Intravenous Once    Continuous Infusions: . sodium chloride 20 mL/hr at 07/28/18 0826  . ceFEPime (MAXIPIME) IV Stopped (07/28/18 0504)  . lactated ringers 100 mL/hr at 07/28/18 0900  . vancomycin       Time spent: 35mins I have personally reviewed and interpreted on  07/28/2018 daily labs, tele strips, imagings as discussed above under date review session and assessment and plans.  I reviewed all nursing notes, pharmacy notes, consultant notes,  vitals, pertinent old records  I have discussed plan of care as described above with RN , patient on 07/28/2018   Albertine GratesFang Kade Demicco MD, PhD  [facility] Pager [phone]. If 7PM-7AM, please contact night-coverage at [email or web address],  password [email or web address] 07/28/2018, 9:43 AM  LOS: 1 day

## 2018-07-28 NOTE — Progress Notes (Signed)
OT Cancellation Note  Patient Details Name: Emily Hicks MRN: [id number] DOB: 16-Sep-1985   Cancelled Treatment:    Reason Eval/Treat Not Completed: Patient not medically ready; pt with increased temp, has been on cooling blanket. Will follow up for OT eval as schedule permits.   Lou Cal, OT [facility] Pager [phone] Office [phone]   Raymondo Band 07/28/2018, 12:25 PM

## 2018-07-28 NOTE — Progress Notes (Signed)
Initial Nutrition Assessment  RD working remotely.   DOCUMENTATION CODES:   (unable to assess for malnutrition at this time.)  INTERVENTION:  - will order Boost Breeze BID, each supplement provides 250 kcal and 9 grams of protein. - will order Ensure Enlive BID, each supplement provides 350 kcal and 20 grams of protein. - will order daily multivitamin with minerals. - continue to encourage PO intakes as tolerated.  - recommend scheduled anti-emetic at meal times if N/V is a persistent issue during hospitalization.    NUTRITION DIAGNOSIS:   Inadequate oral intake related to acute illness, vomiting, poor appetite as evidenced by per patient/family report.  GOAL:   Patient will meet greater than or equal to 90% of their needs  MONITOR:   PO intake, Supplement acceptance, Labs, Weight trends, I & O's  REASON FOR ASSESSMENT:   Consult Assessment of nutrition requirement/status  ASSESSMENT:   33 year-old female with medical history significant of multiple sclerosis and depression. She presented to the ED for the evaluation of AMS, fever, and generalized weakness. In the ED, all information was taken from the patient and her husband on phone. Her husband reported that patient has been increasingly weak since last week and that she has a very poor appetite and has not eaten anything. She has been confused and acting differently. Patient was diagnosed with multiple sclerosis about 3 months ago; she follows with neurology. She is usually able to walk with support. Husband states that she has steadily going downhill since last few weeks. Her husband tries to feed her but she often throws up with intakes.  Patient unable to be reached by phone. RN flow sheet indicates a/o x4 but notes indicate that even in the ED patient was a/o x4 but was very weak and drowsy. No PO intakes documented since admission. Notes indicate that patient's husband reported that she has had a very poor appetite and  intakes for at least the past 1 week. He would try to feed her and she would often throw up; unsure if this was with all items or if solid foods were worse than/caused this more often than liquids.   Per chart review, current weight is 184 lb and weight on 6/2 was 199 lb. This indicates 15 lb weight loss (7.5% body weight) in the past 3 weeks; significant for time frame. Suspect some degree of malnutrition given reports of very poor oral intake. Suspect some degree of weight loss is d/t dehydration.  Per notes: suspected sepsis, AMS with plan for MRI brain, possible need for lumbar puncture, Neurology consult--r/o MS lesions on brain, hypernatremia--likely dehydration, AKI, leukopenia--chronic, MS dx ~3 months ago, elevated liver enzymes--plan to check hepatitis panel, generalized weakness/deconditioning/debility, oral thrush, normocytic anemia.    Medications reviewed; 200 mg diflucan x1 dose 6/23, 5 ml mycostatin QID, 40 mEq K-Dur x1 dose 6/23. Labs reviewed; Na: 157 mmol/l, K: 3.2 mmol/l, Cl: 124 mmol/l, BUN: 26 mg/dl, creatinine: 9.62 mg/dl, Ca: 8.2 mg/dl, LFTs elevated.     NUTRITION - FOCUSED PHYSICAL EXAM:  unable to complete at this time.   Diet Order:   Diet Order            Diet regular Room service appropriate? Yes; Fluid consistency: Thin  Diet effective now              EDUCATION NEEDS:   Not appropriate for education at this time  Skin:  Skin Assessment: Reviewed RN Assessment  Last BM:  PTA/unknown  Height:  Ht Readings from Last 1 Encounters:  07/27/18 5\' 1"  (1.549 m)    Weight:   Wt Readings from Last 1 Encounters:  07/27/18 83.3 kg    Ideal Body Weight:  47.7 kg  BMI:  Body mass index is 34.7 kg/m.  Estimated Nutritional Needs:   Kcal:  2250-2415 kcal  Protein:  100-110 grams  Fluid:  >/= 2.2 L/day      Jarome Matin, MS, RD, LDN, [facility] Inpatient Clinical Dietitian Pager # [phone] After hours/weekend pager # [phone]

## 2018-07-28 NOTE — Progress Notes (Addendum)
PHARMACY NOTE -  Fluconazole, Cefepime  Pharmacy has been assisting with dosing of Diflucan for oropharyngeal candidiasis and Cefepime for sepsis  Dosages remain stable as ordered and need for further dosage adjustment appears unlikely at present given good renal function  Pharmacy will sign off, following peripherally for culture results or dose adjustments. Please reconsult if a change in clinical status warrants re-evaluation of dosage.   Reuel Boom, PharmD, BCPS [phone] 07/28/2018, 12:55 PM

## 2018-07-28 NOTE — Progress Notes (Signed)
PT Cancellation Note  Patient Details Name: Emily Hicks MRN: [id number] DOB: Mar 24, 1985   Cancelled Treatment:    Reason Eval/Treat Not Completed: Patient not medically ready, temp, on cooling blanket and shivering. Will check back tomorrow.   Claretha Cooper 07/28/2018, 11:03 AM  [place] Pager [phone] Office [phone]

## 2018-07-28 NOTE — Sepsis Progress Note (Signed)
!  st and 2nd Lactic Acid levels were drawn. Worksheet complete

## 2018-07-28 NOTE — Progress Notes (Signed)
Kyere notified of pts temp of 102.5, orders for cooling blanket given.

## 2018-07-28 NOTE — Progress Notes (Signed)
CRITICAL VALUE ALERT  Critical Value:  Chloride >130  Date & Time Notified: 07/28/2018 1436   Provider Notified: Florencia Reasons notified at 1454  Orders Received/Actions taken: awaiting new orders

## 2018-07-28 NOTE — Consult Note (Addendum)
Consultation  Referring Provider: [facility]/ Dr Parke Poisson Primary Care Physician:  Leland Her, DO Primary Gastroenterologist:  none.  Reason for Consultation: Thrush, odynophagia in setting of sepsis  HPI: Emily Hicks is a 33 y.o. female who was admitted yesterday through the emergency room, brought in by her husband for onset of progressive weakness and altered mental status with confusion.  Apparently had not been doing well at home over the past couple of weeks with lethargy, weakness and very poor appetite with poor oral intake. She had been diagnosed with MS about 3 months ago and by her chart apparently started Aubagio (immunomodulator) per neurology on 07/06/2018. She says she stopped this one week ago.  She was noted to be febrile in the emergency room to 1029, COVID-19 negative. MRI of the head was negative.  Lactate elevated at 2, creatinine 1.2, pro calcitonin 0.14 WBC 2.5, hemoglobin 8.9 Urine culture and blood cultures are pending Chest x-ray negative  Pt started noticing soreness in mouth and throat 4-5 days ago, then painful swallowing, no appetite , some nausea. No prior hx of thrush or HSV. No prior Gi hx except  Post cholecystectomy.    Past Medical History:  Diagnosis Date  . Gallstones   . GERD (gastroesophageal reflux disease)    only prn OTC occasionally  . Headache   . Multiple sclerosis (HCC)   . Numbness and tingling of left leg   . Numbness and tingling of right leg     Past Surgical History:  Procedure Laterality Date  . CHOLECYSTECTOMY N/A 12/28/2013   Procedure: LAPAROSCOPIC CHOLECYSTECTOMY;  Surgeon: Claud Kelp, MD;  Location: [facility];  Service: General;  Laterality: N/A;    Prior to Admission medications   Medication Sig Start Date End Date Taking? Authorizing Provider  DULoxetine (CYMBALTA) 60 MG capsule Take 1 capsule (60 mg total) by mouth daily. 06/01/18  Yes Levert Feinstein, MD  ibuprofen (ADVIL) 200 MG tablet Take 800 mg by mouth every 6 (six)  hours as needed for mild pain.   Yes [provider]  medroxyPROGESTERone (DEPO-PROVERA) 150 MG/ML injection Inject 1 mL (150 mg total) into the muscle every 3 (three) months. 02/11/11  Yes Ozella Rocks, MD  mirtazapine (REMERON) 15 MG tablet Take 1 tablet (15 mg total) by mouth at bedtime. 07/06/18  Yes Levert Feinstein, MD  ALPRAZolam Prudy Feeler) 1 MG tablet Take 1-2 tablets thirty minutes prior to MRI.  May take one additional tablet before entering scanner, if needed.  MUST HAVE DRIVER. Patient not taking: Reported on 07/16/2018 03/16/18   Levert Feinstein, MD  naproxen (NAPROSYN) 500 MG tablet Take 1 tablet (500 mg total) by mouth 2 (two) times daily. Patient not taking: Reported on 07/27/2018 06/25/18   Wynetta Fines, MD    Current Facility-Administered Medications  Medication Dose Route Frequency Provider Last Rate Last Dose  . ceFEPIme (MAXIPIME) 2 g in sodium chloride 0.9 % 100 mL IVPB  2 g Intravenous Q8H Adhikari, Amrit, MD 200 mL/hr at 07/28/18 1236 2 g at 07/28/18 1236  . Chlorhexidine Gluconate Cloth 2 % PADS 6 each  6 each Topical Q0600 Burnadette Pop, MD   6 each at 07/28/18 1045  . feeding supplement (BOOST / RESOURCE BREEZE) liquid 1 Container  1 Container Oral BID BM Albertine Grates, MD      . feeding supplement (ENSURE ENLIVE) (ENSURE ENLIVE) liquid 237 mL  237 mL Oral BID BM Albertine Grates, MD   237 mL at 07/28/18 1048  .  fluconazole (DIFLUCAN) IVPB 200 mg  200 mg Intravenous Q24H Danford BadWofford, Drew A, RPH   Stopped at 07/28/18 1202  . free water 300 mL  300 mL Per Tube Q8H Adhikari, Amrit, MD      . heparin injection 5,000 Units  5,000 Units Subcutaneous Q8H Burnadette PopAdhikari, Amrit, MD   5,000 Units at 07/28/18 0607  . lactated ringers infusion   Intravenous Continuous Albertine GratesXu, Fang, MD 100 mL/hr at 07/28/18 0900    . LORazepam (ATIVAN) tablet 0.5 mg  0.5 mg Oral Q6H PRN Burnadette PopAdhikari, Amrit, MD      . multivitamin with minerals tablet 1 tablet  1 tablet Oral Daily Albertine GratesXu, Fang, MD      . nystatin (MYCOSTATIN) 100000  UNIT/ML suspension 500,000 Units  5 mL Oral QID Burnadette PopAdhikari, Amrit, MD   500,000 Units at 07/28/18 1048  . ondansetron (ZOFRAN) injection 4 mg  4 mg Intravenous Q6H PRN Omelia BlackwaterKyere, Belinda K, NP   4 mg at 07/27/18 2154  . potassium chloride SA (K-DUR) CR tablet 40 mEq  40 mEq Oral Once Burnadette PopAdhikari, Amrit, MD   Stopped at 07/27/18 1826  . sodium chloride flush (NS) 0.9 % injection 3 mL  3 mL Intravenous Once Burnadette PopAdhikari, Amrit, MD       Facility-Administered Medications Ordered in Other Encounters  Medication Dose Route Frequency Provider Last Rate Last Dose  . gadopentetate dimeglumine (MAGNEVIST) injection 20 mL  20 mL Intravenous Once PRN Levert FeinsteinYan, Yijun, MD        Allergies as of 07/27/2018  . (No Known Allergies)    Family History  Problem Relation Age of Onset  . Hypertension Father   . Hypertension Mother   . Hypertension Sister   . Diabetes Maternal Grandmother   . Diabetes Maternal Grandfather     Social History   Socioeconomic History  . Marital status: Single    Spouse name: Not on file  . Number of children: 1  . Years of education: 5916  . Highest education level: Bachelor's degree (e.g., BA, AB, BS)  Occupational History  . Occupation: Audiological scientistAccounting  Social Needs  . Financial resource strain: Not on file  . Food insecurity    Worry: Not on file    Inability: Not on file  . Transportation needs    Medical: Not on file    Non-medical: Not on file  Tobacco Use  . Smoking status: Never Smoker  . Smokeless tobacco: Never Used  Substance and Sexual Activity  . Alcohol use: No  . Drug use: No  . Sexual activity: Not Currently  Lifestyle  . Physical activity    Days per week: Not on file    Minutes per session: Not on file  . Stress: Not on file  Relationships  . Social Musicianconnections    Talks on phone: Not on file    Gets together: Not on file    Attends religious service: Not on file    Active member of club or organization: Not on file    Attends meetings of clubs or  organizations: Not on file    Relationship status: Not on file  . Intimate partner violence    Fear of current or ex partner: Not on file    Emotionally abused: Not on file    Physically abused: Not on file    Forced sexual activity: Not on file  Other Topics Concern  . Not on file  Social History Narrative   Lives at home with husband and son.  Right-handed.   No caffeine use.    Review of Systems: Pertinent positive and negative review of systems were noted in the above HPI section.  All other review of systems was otherwise negative.  Physical Exam: Vital signs in last 24 hours: Temp:  [100.2 F (37.9 C)-102.8 F (39.3 C)] 100.2 F (37.9 C) (06/24 1200) Pulse Rate:  [124-147] 135 (06/24 0500) Resp:  [12-27] 23 (06/24 0900) BP: (107-154)/(57-85) 107/81 (06/24 0900) SpO2:  [93 %-100 %] 96 % (06/24 0500) Weight:  [83.3 kg] 83.3 kg (06/23 1745)   General:   Alert,  Well-developed,ill appearing AA female well-nourished, pleasant and cooperative in NAD Head:  Normocephalic and atraumatic. Eyes:  Sclera clear, no icterus.   Conjunctiva pink. Ears:  Normal auditory acuity. Nose:  No deformity, discharge,  or lesions. Mouth:  Severe oral thrush with large plaques, and exudate, no ulcers seen  Neck:  Supple; no masses or thyromegaly. Lungs:  Clear throughout to auscultation.   No wheezes, crackles, or rhonchi. Heart:  Regular rate and rhythm; no murmurs, clicks, rubs,  or gallops. Abdomen:  Soft,nontender, BS active,nonpalp mass or hsm.   Rectal:  Deferred  Msk:  Symmetrical without gross deformities. . Pulses:  Normal pulses noted. Extremities:  Without clubbing or edema. Neurologic:  Alert and  oriented x4;  grossly normal neurologically. Skin:  Intact without significant lesions or rashes.. Psych:  Alert and cooperative. Intake/Output from previous day: 06/23 0701 - 06/24 0700 In: 1337.6 [I.V.:137.6; IV Piggyback:1200] Out: -  Intake/Output this shift: Total I/O In:  904.2 [I.V.:805.7; IV Piggyback:98.6] Out: 200 [Urine:200]  Lab Results: Recent Labs    07/27/18 1248  WBC 2.5*  HGB 8.9*  HCT 29.8*  PLT 265   BMET Recent Labs    07/27/18 1248  NA 157*  K 3.2*  CL 124*  CO2 23  GLUCOSE 120*  BUN 26*  CREATININE 1.21*  CALCIUM 8.2*   LFT Recent Labs    07/27/18 1248  PROT 7.3  ALBUMIN 2.6*  AST 169*  ALT 52*  ALKPHOS 56  BILITOT 0.5   PT/INR No results for input(s): LABPROT, INR in the last 72 hours. Hepatitis Panel No results for input(s): HEPBSAG, HCVAB, HEPAIGM, HEPBIGM in the last 72 hours.  IMPRESSION:   #1 33 yo African-American female admitted yesterday with sepsis picture with fever, weakness, altered mental status after general decline at home over the past 2 weeks.  Patient had just started Aubagio for MS on 07/06/2018 she is likely culprit..  Drug is associated with pancytopenia, sepsis of suppression, hepatotoxicity neutropenia and nausea.  Patient stopped Aubagio about 1 week ago #2 new diagnosis of MS made about 3 months ago. # 3 oral pharyngeal and probable esophageal candidiasis #4 Mild transaminitis #5  Anemia and leukopenia #6  acute kidney injury  PLAN: IV Diflucan has been started, continue with general course of 10 to 14 days total Start Mycostatin oral suspension 5 cc swish and swallow or swish and spit 4 times daily Start viscous lidocaine 5 cc 20 minutes prior to meals or oral intake.  Await pending urine culture and blood culture Check CMV and EBV serology  Can consider endoscopic evaluation if she does not improve over the next couple of days, though may not change our management.  Please notify patient's neurologist of adverse reaction/illness after initiation of Aubagio as alternative regimen for her MS will need to be initiated when she is over this acute illness.    Amy Esterwood  07/28/2018,  1:51 PM     Attending physician's note   I have taken an interval history, reviewed the chart  and examined the patient. I agree with the Advanced Practitioner's note, impression and recommendations.   Odynophagia likely d/t Candida esophagitis.  Patient has oral thrush. Sepsis with pancytopenia (thought to be due to drug reaction (teriflunomide) Newly diagnosed MS Mildly abnormal LFTs likely d/t drug reaction (teriflunomide for MS)  Plan: -IV Diflucan as already being done.  -Mycostatin/nystatin swishes and swallows 5-10 cc QID. -If she does not improve in 3-4 days, will consider EGD.  In the setting of sepsis, would like to avoid it if he can. -Check CMV, EBV serology -Trend LFTs.  Will proceed with US abdo  if LFTs continue to be elevated. -Continue supportive treatment.    Edman Circleaj Alexxander Kurt, MD [facility] [phone].

## 2018-07-29 DIAGNOSIS — B3781 Candidal esophagitis: Secondary | ICD-10-CM | POA: Diagnosis present

## 2018-07-29 LAB — BASIC METABOLIC PANEL
Anion gap: 7 (ref 5–15)
BUN: 18 mg/dL (ref 6–20)
BUN: 17 mg/dL (ref 6–20)
CO2: 17 mmol/L — ABNORMAL LOW (ref 22–32)
CO2: 19 mmol/L — ABNORMAL LOW (ref 22–32)
Calcium: 7.6 mg/dL — ABNORMAL LOW (ref 8.9–10.3)
Calcium: 7.5 mg/dL — ABNORMAL LOW (ref 8.9–10.3)
Chloride: >130 mmol/L (ref 98–111)
Chloride: 130 mmol/L — ABNORMAL HIGH (ref 98–111)
Creatinine, Ser: 0.98 mg/dL (ref 0.44–1.00)
Creatinine, Ser: 0.8 mg/dL (ref 0.44–1.00)
GFR calc Af Amer: >60 mL/min (ref >60)
GFR calc Af Amer: >60 mL/min (ref >60)
GFR calc non Af Amer: >60 mL/min (ref >60)
GFR calc non Af Amer: >60 mL/min (ref >60)
Glucose, Bld: 88 mg/dL (ref 70–99)
Glucose, Bld: 80 mg/dL (ref 70–99)
Potassium: 3.8 mmol/L (ref 3.5–5.1)
Potassium: 3.3 mmol/L — ABNORMAL LOW (ref 3.5–5.1)
Sodium: 159 mmol/L — ABNORMAL HIGH (ref 135–145)
Sodium: 156 mmol/L — ABNORMAL HIGH (ref 135–145)

## 2018-07-29 LAB — CBC WITH DIFFERENTIAL/PLATELET
Abs Immature Granulocytes: 0.05 10*3/uL (ref 0.00–0.07)
Basophils Absolute: 0 10*3/uL (ref 0.0–0.1)
Basophils Relative: 0 %
Eosinophils Absolute: 0 10*3/uL (ref 0.0–0.5)
Eosinophils Relative: 0 %
HCT: 23.8 % — ABNORMAL LOW (ref 36.0–46.0)
Hemoglobin: 7.1 g/dL — ABNORMAL LOW (ref 12.0–15.0)
Immature Granulocytes: 1 %
Lymphocytes Relative: 10 %
Lymphs Abs: 0.4 10*3/uL — ABNORMAL LOW (ref 0.7–4.0)
MCH: 26.7 pg (ref 26.0–34.0)
MCHC: 29.8 g/dL — ABNORMAL LOW (ref 30.0–36.0)
MCV: 89.5 fL (ref 80.0–100.0)
Monocytes Absolute: 0.1 10*3/uL (ref 0.1–1.0)
Monocytes Relative: 3 %
Neutro Abs: 3.5 10*3/uL (ref 1.7–7.7)
Neutrophils Relative %: 86 %
Platelets: 192 10*3/uL (ref 150–400)
RBC: 2.66 MIL/uL — ABNORMAL LOW (ref 3.87–5.11)
RDW: 16.8 % — ABNORMAL HIGH (ref 11.5–15.5)
WBC: 4.1 10*3/uL (ref 4.0–10.5)
nRBC: 0.5 % — ABNORMAL HIGH (ref 0.0–0.2)

## 2018-07-29 LAB — HEPATITIS PANEL, ACUTE
HCV Ab: 0.2 s/co ratio (ref 0.0–0.9)
Hep A IgM: NEGATIVE
Hep B C IgM: NEGATIVE
Hepatitis B Surface Ag: NEGATIVE

## 2018-07-29 LAB — COMPREHENSIVE METABOLIC PANEL
ALT: 41 U/L (ref 0–44)
AST: 117 U/L — ABNORMAL HIGH (ref 15–41)
Albumin: 1.9 g/dL — ABNORMAL LOW (ref 3.5–5.0)
Alkaline Phosphatase: 46 U/L (ref 38–126)
Anion gap: 6 (ref 5–15)
BUN: 18 mg/dL (ref 6–20)
CO2: 20 mmol/L — ABNORMAL LOW (ref 22–32)
Calcium: 7.3 mg/dL — ABNORMAL LOW (ref 8.9–10.3)
Chloride: 130 mmol/L — ABNORMAL HIGH (ref 98–111)
Creatinine, Ser: 0.9 mg/dL (ref 0.44–1.00)
GFR calc Af Amer: >60 mL/min (ref >60)
GFR calc non Af Amer: >60 mL/min (ref >60)
Glucose, Bld: 108 mg/dL — ABNORMAL HIGH (ref 70–99)
Potassium: 2.9 mmol/L — ABNORMAL LOW (ref 3.5–5.1)
Sodium: 156 mmol/L — ABNORMAL HIGH (ref 135–145)
Total Bilirubin: 0.6 mg/dL (ref 0.3–1.2)
Total Protein: 5.6 g/dL — ABNORMAL LOW (ref 6.5–8.1)

## 2018-07-29 LAB — RPR: RPR Ser Ql: NONREACTIVE

## 2018-07-29 LAB — EBV AB TO VIRAL CAPSID AG PNL, IGG+IGM
EBV VCA IgG: >600 U/mL — ABNORMAL HIGH (ref 0.0–17.9)
EBV VCA IgM: <36 U/mL (ref 0.0–35.9)

## 2018-07-29 LAB — MAGNESIUM: Magnesium: 1.8 mg/dL (ref 1.7–2.4)

## 2018-07-29 LAB — PROCALCITONIN: Procalcitonin: 0.21 ng/mL

## 2018-07-29 LAB — CMV IGM: CMV IgM: >240 AU/mL — ABNORMAL HIGH (ref 0.0–29.9)

## 2018-07-29 LAB — HIV ANTIBODY (ROUTINE TESTING W REFLEX): HIV Screen 4th Generation wRfx: NONREACTIVE

## 2018-07-29 MED ORDER — LACTATED RINGERS IV BOLUS
1000.0000 mL | Freq: Once | INTRAVENOUS | Status: AC
  Administered 2018-07-29: 10:00:00 1000 mL via INTRAVENOUS

## 2018-07-29 MED ORDER — DEXTROSE 5 % IV SOLN
INTRAVENOUS | Status: AC
  Administered 2018-07-29 – 2018-07-30 (×2): via INTRAVENOUS

## 2018-07-29 MED ORDER — POTASSIUM CHLORIDE 10 MEQ/100ML IV SOLN
10.0000 meq | INTRAVENOUS | Status: AC
  Administered 2018-07-29 (×4): 10 meq via INTRAVENOUS
  Filled 2018-07-29 (×4): qty 100

## 2018-07-29 MED ORDER — ACETAMINOPHEN 325 MG PO TABS
650.0000 mg | ORAL_TABLET | Freq: Four times a day (QID) | ORAL | Status: DC | PRN
  Administered 2018-07-29 – 2018-08-11 (×18): 650 mg via ORAL
  Filled 2018-07-29 (×19): qty 2

## 2018-07-29 MED ORDER — POTASSIUM CHLORIDE CRYS ER 20 MEQ PO TBCR
40.0000 meq | EXTENDED_RELEASE_TABLET | ORAL | Status: DC
  Filled 2018-07-29: qty 2

## 2018-07-29 MED ORDER — POTASSIUM CHLORIDE 10 MEQ/100ML IV SOLN
10.0000 meq | INTRAVENOUS | Status: AC
  Administered 2018-07-29 (×5): 10 meq via INTRAVENOUS
  Filled 2018-07-29 (×5): qty 100

## 2018-07-29 MED ORDER — MAGNESIUM SULFATE 2 GM/50ML IV SOLN
2.0000 g | Freq: Once | INTRAVENOUS | Status: AC
  Administered 2018-07-29: 2 g via INTRAVENOUS
  Filled 2018-07-29: qty 50

## 2018-07-29 NOTE — Evaluation (Signed)
Physical Therapy Evaluation Patient Details Name: Emily Hicks MRN: [id number] DOB: 1985/09/26 Today's Date: 07/29/2018   History of Present Illness  This 33 year old female was admitted for progressive weakness and AMS.  She was recently dxd with MS  Clinical Impression  Pt presents with weakness, difficulty performing mobility tasks, impaired sitting balance and decreased activity tolerance. Pt to benefit from acute PT to address deficits. Pt sat EOB with PT and OT for 10 minutes, requiring min assist to correct L lateral and posterior leaning. Pt is very kind and motivated to return to PLOF, works in an Press photographer firm presently. PT recommending CIR to return to PLOF. Per pt, she was working full time and able to climb 10 steps to get to apt. Pt has supportive husband at home, will be available at d/c to assist pt as needed. PT to progress mobility as tolerated, and will continue to follow acutely.      Follow Up Recommendations CIR    Equipment Recommendations  Other (comment)(defer)    Recommendations for Other Services       Precautions / Restrictions Precautions Precautions: Fall Restrictions Weight Bearing Restrictions: No      Mobility  Bed Mobility Overal bed mobility: Needs Assistance Bed Mobility: Rolling;Sidelying to Sit;Supine to Sit;Sit to Supine Rolling: Max assist   Supine to sit: +2 for physical assistance;Total assist Sit to supine: Total assist;+2 for physical assistance   General bed mobility comments: max assist for rolling for roll onto side with use of bedpads for placement of bed pan. Total assist +2 for for supine<>sit for LE and trunk management, scooting to EOB with use of bed pad.  Transfers                 General transfer comment: not attempted  Ambulation/Gait                Stairs            Wheelchair Mobility    Modified Rankin (Stroke Patients Only)       Balance Overall balance assessment: Needs  assistance Sitting-balance support: Feet supported Sitting balance-Leahy Scale: Poor Sitting balance - Comments: pt tends to weight bear through ball of feet, even when feet can be supported flat on floor.  Tended to weight bear more towards L side with L lateral leaning in sitting. Requires min assist to right posture. sat EOB ~10 minutes to address impaired sitting balance Postural control: Posterior lean;Left lateral lean   Standing balance-Leahy Scale: (NT)                               Pertinent Vitals/Pain Pain Assessment: No/denies pain    Home Living Family/patient expects to be discharged to:: Private residence Living Arrangements: Spouse/significant other Available Help at Discharge: Family Type of Home: Apartment Home Access: Stairs to enter Entrance Stairs-Rails: Right Entrance Stairs-Number of Steps: about 10 with rail on R(2nd floor apt) Home Layout: One level Home Equipment: None Additional Comments: lives with husband and 36 y o son. Husband doesn't work during summer. Pt works at an Press photographer firm, states she has been working full time up until now.    Prior Function Level of Independence: Needs assistance   Gait / Transfers Assistance Needed: Pt states she doesn't use AD, but has been very weak lately  ADL's / Homemaking Assistance Needed: recently needed assistance from husband,  especially with dressing and bathing.  Comments:  pt works as an Sport and exercise psychologist: Right    Extremity/Trunk Assessment   Upper Extremity Assessment Upper Extremity Assessment: Generalized weakness(grossly 2+/5 per OT eval)    Lower Extremity Assessment Lower Extremity Assessment: Generalized weakness(2/5 knee hip flexion, flexion/extension; 3/5 DF/PF)    Cervical / Trunk Assessment Cervical / Trunk Assessment: Other exceptions Cervical / Trunk Exceptions: tends to laterally flex neck to L, able to right with difficulty and  overcorrection to posture. limited cervical rotation R>L  Communication   Communication: (some dysarthria)  Cognition Arousal/Alertness: Awake/alert Behavior During Therapy: WFL for tasks assessed/performed Overall Cognitive Status: No family/caregiver present to determine baseline cognitive functioning                                 General Comments: extra time to answer questions, slow processing      General Comments      Exercises     Assessment/Plan    PT Assessment Patient needs continued PT services  PT Problem List Decreased strength;Decreased range of motion;Decreased activity tolerance;Decreased balance;Decreased knowledge of use of DME;Pain;Decreased mobility;Decreased safety awareness       PT Treatment Interventions DME instruction;Therapeutic activities;Gait training;Therapeutic exercise;Patient/family education;Balance training;Stair training;Functional mobility training;Neuromuscular re-education    PT Goals (Current goals can be found in the Care Plan section)  Acute Rehab PT Goals Patient Stated Goal: none stated; agreeable to therapy PT Goal Formulation: With patient Time For Goal Achievement: 08/12/18 Potential to Achieve Goals: Good    Frequency Min 4X/week   Barriers to discharge        Co-evaluation PT/OT/SLP Co-Evaluation/Treatment: Yes Reason for Co-Treatment: For patient/therapist safety PT goals addressed during session: Mobility/safety with mobility OT goals addressed during session: ADL's and self-care       AM-PAC PT "6 Clicks" Mobility  Outcome Measure Help needed turning from your back to your side while in a flat bed without using bedrails?: A Lot Help needed moving from lying on your back to sitting on the side of a flat bed without using bedrails?: Total Help needed moving to and from a bed to a chair (including a wheelchair)?: Total Help needed standing up from a chair using your arms (e.g., wheelchair or bedside  chair)?: Total Help needed to walk in hospital room?: Total Help needed climbing 3-5 steps with a railing? : Total 6 Click Score: 7    End of Session   Activity Tolerance: Patient tolerated treatment well Patient left: in bed;with bed alarm set;with call bell/phone within reach Nurse Communication: Mobility status PT Visit Diagnosis: Other abnormalities of gait and mobility (R26.89);Difficulty in walking, not elsewhere classified (R26.2)    Time: 2334-3568 PT Time Calculation (min) (ACUTE ONLY): 28 min   Charges:   PT Evaluation $PT Eval Low Complexity: 1 Low         Oneisha Ammons Terrial Rhodes, PT Acute Rehabilitation Services Pager [phone]  Office [phone]  Vannie Hochstetler D Despina Hidden 07/29/2018, 2:41 PM

## 2018-07-29 NOTE — Progress Notes (Signed)
PROGRESS NOTE  Emily DublinJamie Hicks Hicks ZOX:[id number] DOB: 05-25-1985 DOA: 07/27/2018 PCP: Emily Hicks, Emily J, DO  HPI/Recap of past 24 hours:  Last fever 102.2 at midnight , tachycardia has much improved,  Persistent Odynophagia,  no cough, no hypoxia Denies headache, no neck pain or rigidity  Reports generalized weakness  Assessment/Plan: Principal Problem:   Sepsis (HCC) Active Problems:   MS (multiple sclerosis) (HCC)   Tachycardia   Leukopenia   AKI (acute kidney injury) (HCC)   Hypernatremia   Altered mental status   Fever   AMS (altered mental status)   Acute metabolic encephalopathy   Hypokalemia   Sepsis /acute metabolic encephalopathy likely due to oropharyngeal candidiasis + esophageal candida -mrsa screening negative, SARS cov2 screening negative, Blood culture negative, urine culture negative, cxr no acute infiltrate, she denies headache, no nuchal rigidity, mri brain no acute findings - she appears is improving on  iv diflucan, topical nystatin and iv cefepime --CMV Igm elevated at 240, case discussed with ID Dr Orvan Falconerampbell who recommend continue current treatment course since patient appear is improving, per Dr Wess Bottsampell elevated Igm could be nonspecific  -hsv prc in process, EBV igg and igm in process -hiv/RPR negative, -GI and infectious disease input appreciated   Hypernatremia: Likely due to dehydration, ivf and oral free water Not improving, change ivf to d5, Repeat bmp q4hrs  AKI Likely prerenal  Continue hydration, cr normalized  Hypokalemia: replace k, mag 1.8 , will give 2g mag per iv  abnormal lft Side effect from [place]? Monitor, liver us in persists GI following  Anemia:  no overt sign of bleeding Possible due to aubagio Monitor  Transfuse if hgb less than 7   H/O MS -stopped Aubagio a week ago due to cytopenia and abnormal liver function -Mri brain no acute findings -Per neurology, likely pseudo exacerbation due to acute illness ( sepsis)    Generalized weakness PT eval  Code Status: full  Family Communication: patient   Disposition Plan: remain in stepdown   Consultants:  GI  ID  CIR  Procedures:  none  Antibiotics:  vanc x1 on presentation  Cefepime from presentation   Objective: BP (!) 136/91   Pulse 98   Temp 98.6 F (37 C) (Oral)   Resp 19   Ht 5\' 1"  (1.549 m)   Wt 83.3 kg   SpO2 96%   BMI 34.70 kg/m   Intake/Output Summary (Last 24 hours) at 07/29/2018 0942 Last data filed at 07/29/2018 0538 Gross per 24 hour  Intake 3151.78 ml  Output 950 ml  Net 2201.78 ml   Filed Weights   07/27/18 1745  Weight: 83.3 kg    Exam: Patient is examined daily including today on 07/29/2018, exams remain the same as of yesterday except that has changed    General:  Weak, diffuse oropharygeal thrush , on tongue, on palate   Cardiovascular: sinus tachycardia much improved  Respiratory: CTABL  Abdomen: Soft/ND/NT, positive BS  Musculoskeletal: No Edema  Neuro: alert, appear slow in answering questions but oriented x3, generalized weakness  Data Reviewed: Basic Metabolic Panel: Recent Labs  Lab 07/27/18 1248 07/28/18 1349 07/29/18 0219  NA 157* 157* 156*  K 3.2* 3.0* 2.9*  CL 124* >130* 130*  CO2 23 21* 20*  GLUCOSE 120* 123* 108*  BUN 26* 20 18  CREATININE 1.21* 0.94 0.90  CALCIUM 8.2* 7.3* 7.3*  MG  --   --  1.8   Liver Function Tests: Recent Labs  Lab 07/27/18 1248 07/28/18  1349 07/29/18 0219  AST 169* 142* 117*  ALT 52* 48* 41  ALKPHOS 56 47 46  BILITOT 0.5 0.3 0.6  PROT 7.3 6.0* 5.6*  ALBUMIN 2.6* 2.1* 1.9*   No results for input(s): LIPASE, AMYLASE in the last 168 hours. No results for input(s): AMMONIA in the last 168 hours. CBC: Recent Labs  Lab 07/27/18 1248 07/28/18 1349 07/29/18 0219  WBC 2.5* 5.3 4.1  NEUTROABS 2.0  --  3.5  HGB 8.9* 7.7* 7.1*  HCT 29.8* 25.5* 23.8*  MCV 88.4 91.1 89.5  PLT 265 192 192   Cardiac Enzymes:   No results for input(s):  CKTOTAL, CKMB, CKMBINDEX, TROPONINI in the last 168 hours. BNP (last 3 results) No results for input(s): BNP in the last 8760 hours.  ProBNP (last 3 results) No results for input(s): PROBNP in the last 8760 hours.  CBG: No results for input(s): GLUCAP in the last 168 hours.  Recent Results (from the past 240 hour(s))  Blood Culture (routine x 2)     Status: None (Preliminary result)   Collection Time: 07/27/18  2:04 PM   Specimen: BLOOD  Result Value Ref Range Status   Specimen Description   Final    BLOOD LEFT ANTECUBITAL Performed at [facility], [address] [place], [place] [address]    Special Requests   Final    BOTTLES DRAWN AEROBIC AND ANAEROBIC Blood Culture adequate volume Performed at [facility], [address] [place], [place] [address]    Culture   Final    NO GROWTH < 24 HOURS Performed at [facility], [address] [place], [place] [address]    Report Status PENDING  Incomplete  Blood Culture (routine x 2)     Status: None (Preliminary result)   Collection Time: 07/27/18  2:09 PM   Specimen: BLOOD  Result Value Ref Range Status   Specimen Description   Final    BLOOD RIGHT ANTECUBITAL Performed at [facility], [address], [place], [place] [address]    Special Requests   Final    BOTTLES DRAWN AEROBIC AND ANAEROBIC Blood Culture adequate volume Performed at [facility], [address], [place], [place] [address]    Culture   Final    NO GROWTH < 24 HOURS Performed at [facility], [address] [place], [place] [address]    Report Status PENDING  Incomplete  SARS Coronavirus 2 (CEPHEID - Performed in [facility] hospital lab), Hosp Order     Status: None   Collection Time: 07/27/18  2:34 PM   Specimen: Nasopharyngeal Swab  Result Value Ref Range Status   SARS Coronavirus 2 NEGATIVE NEGATIVE Final    Comment: (NOTE) If result is NEGATIVE SARS-CoV-2  target nucleic acids are NOT DETECTED. The SARS-CoV-2 RNA is generally detectable in upper and lower  respiratory specimens during the acute phase of infection. The lowest  concentration of SARS-CoV-2 viral copies this assay can detect is 250  copies / mL. A negative result does not preclude SARS-CoV-2 infection  and should not be used as the sole basis for treatment or other  patient management decisions.  A negative result may occur with  improper specimen collection / handling, submission of specimen other  than nasopharyngeal swab, presence of viral mutation(s) within the  areas targeted by this assay, and inadequate number of viral copies  (<250 copies / mL). A negative result must be combined with  clinical  observations, patient history, and epidemiological information. If result is POSITIVE SARS-CoV-2 target nucleic acids are DETECTED. The SARS-CoV-2 RNA is generally detectable in upper and lower  respiratory specimens dur ing the acute phase of infection.  Positive  results are indicative of active infection with SARS-CoV-2.  Clinical  correlation with patient history and other diagnostic information is  necessary to determine patient infection status.  Positive results do  not rule out bacterial infection or co-infection with other viruses. If result is PRESUMPTIVE POSTIVE SARS-CoV-2 nucleic acids MAY BE PRESENT.   A presumptive positive result was obtained on the submitted specimen  and confirmed on repeat testing.  While 2019 novel coronavirus  (SARS-CoV-2) nucleic acids may be present in the submitted sample  additional confirmatory testing may be necessary for epidemiological  and / or clinical management purposes  to differentiate between  SARS-CoV-2 and other Sarbecovirus currently known to infect humans.  If clinically indicated additional testing with an alternate test  methodology [phone]([id number]) is advised. The SARS-CoV-2 RNA is generally  detectable in upper and lower  respiratory sp ecimens during the acute  phase of infection. The expected result is Negative. Fact Sheet for Patients:  BoilerBrush.com.cyhttps://www.fda.gov/media/136312/download Fact Sheet for Healthcare Providers: https://pope.com/https://www.fda.gov/media/136313/download This test is not yet approved or cleared by the [place] FDA and has been authorized for detection and/or diagnosis of SARS-CoV-2 by FDA under an Emergency Use Authorization (EUA).  This EUA will remain in effect (meaning this test can be used) for the duration of the COVID-19 declaration under Section 564(b)(1) of the Act, 21 U.S.C. section 360bbb-3(b)(1), unless the authorization is terminated or revoked sooner. Performed at [facility], [address] [place], [place] [address]   Urine culture     Status: None   Collection Time: 07/27/18  5:01 PM   Specimen: Urine, Catheterized  Result Value Ref Range Status   Specimen Description   Final    URINE, CATHETERIZED Performed at [facility], [address] [place], [place] [address]    Special Requests   Final    NONE Performed at [facility], [address] [place], [place] [address]    Culture   Final    NO GROWTH Performed at [facility], [address] [place], [place] [address]    Report Status 07/28/2018 FINAL  Final  MRSA PCR Screening     Status: None   Collection Time: 07/27/18  5:39 PM   Specimen: Nasal Mucosa; Nasopharyngeal  Result Value Ref Range Status   MRSA by PCR NEGATIVE NEGATIVE Final    Comment:        The GeneXpert MRSA Assay (FDA approved for NASAL specimens only), is one component of a comprehensive MRSA colonization surveillance program. It is not intended to diagnose MRSA infection nor to guide or monitor treatment for MRSA infections. Performed at [facility], [address] [place], [place] [address]      Studies: No results found.  Scheduled Meds: .  Chlorhexidine Gluconate Cloth  6 each Topical Q0600  . feeding supplement  1 Container Oral BID BM  . feeding supplement (ENSURE ENLIVE)  237 mL Oral BID BM  . free water  300 mL Oral Q8H  . heparin  5,000 Units Subcutaneous Q8H  . lidocaine  15 mL Mouth/Throat TID AC & HS  . multivitamin with minerals  1 tablet Oral Daily  . nystatin  5 mL Oral QID  . sodium chloride  flush  3 mL Intravenous Once    Continuous Infusions: . ceFEPime (MAXIPIME) IV Stopped (07/29/18 0441)  . fluconazole (DIFLUCAN) IV Stopped (07/28/18 1202)  . lactated ringers    . lactated ringers 100 mL/hr at 07/28/18 1700  . magnesium sulfate bolus IVPB    . potassium chloride 10 mEq (07/29/18 0741)     Time spent: 28mins I have personally reviewed and interpreted on  07/29/2018 daily labs, tele strips, imagings as discussed above under date review session and assessment and plans.  I reviewed all nursing notes, pharmacy notes, consultant notes,  vitals, pertinent old records  I have discussed plan of care as described above with RN , patient on 07/29/2018   Florencia Reasons MD, PhD  [facility] Pager [phone]. If 7PM-7AM, please contact night-coverage at [email or web address], password [facility] 07/29/2018, 9:42 AM  LOS: 2 days

## 2018-07-29 NOTE — Evaluation (Signed)
Occupational Therapy Evaluation Patient Details Name: Emily Hicks MRN: [id number] DOB: [birth year] Today's Date: 07/29/2018    History of Present Illness This 33 year old female was admitted for progressive weakness and AMS.  She was recently dxd with MS   Clinical Impression   Pt was admitted for the above.  Pt states husband was recently helping with adls and per chart, he was driving her to work; she works as an Airline pilot.  Pt tolerated therapy well and sat EOB with min guard to min A.  Pt needs extensive assistance with adls due to weakness.  Recommend CIR for rehab prior to home.       Follow Up Recommendations  CIR    Equipment Recommendations  3 in 1 bedside commode    Recommendations for Other Services       Precautions / Restrictions Precautions Precautions: Fall Restrictions Weight Bearing Restrictions: No      Mobility Bed Mobility Overal bed mobility: Needs Assistance Bed Mobility: Rolling;Sidelying to Sit;Supine to Sit;Sit to Supine Rolling: Max assist   Supine to sit: +2 for physical assistance;Total assist Sit to supine: Total assist;+2 for physical assistance   General bed mobility comments: rolled to R and L for bedpan. Assist to bend leg and roll  Transfers                 General transfer comment: not attempted    Balance Overall balance assessment: Needs assistance Sitting-balance support: Feet supported   Sitting balance - Comments: pt tends to weight bear through ball of feet, even when feet can be supported flat on floor.  Tended to weight bear more towards L side                                   ADL either performed or assessed with clinical judgement   ADL Overall ADL's : Needs assistance/impaired Eating/Feeding: Minimal assistance   Grooming: Moderate assistance   Upper Body Bathing: Maximal assistance   Lower Body Bathing: Total assistance;+2 for physical assistance   Upper Body Dressing : Maximal  assistance   Lower Body Dressing: Total assistance;+2 for physical assistance       Toileting- Clothing Manipulation and Hygiene: Total assistance;+2 for physical assistance         General ADL Comments: sat EOB with min to min guard assistance.  ADls assessed from sit to supine.  Did not stand, Hgb 7.1 today.       Vision         Perception     Praxis      Pertinent Vitals/Pain Pain Assessment: No/denies pain     Hand Dominance Right   Extremity/Trunk Assessment Upper Extremity Assessment Upper Extremity Assessment: Generalized weakness(grossly 2+/5)       Cervical / Trunk Assessment Cervical / Trunk Assessment: Other exceptions Cervical / Trunk Exceptions: tends to laterally flex neck to L   Communication Communication Communication: (some dysarthria)   Cognition Arousal/Alertness: Awake/alert Behavior During Therapy: WFL for tasks assessed/performed Overall Cognitive Status: No family/caregiver present to determine baseline cognitive functioning                                 General Comments: extra time to answer questions, slow processing   General Comments       Exercises     Shoulder Instructions  Home Living Family/patient expects to be discharged to:: Private residence Living Arrangements: Spouse/significant other Available Help at Discharge: Family Type of Home: Apartment Home Access: Stairs to enter [employer] of Steps: about 10 with rail on R(2nd floor apt)                       Additional Comments: lives with husband and 56 y o son. Husband doesn't work during summer      Prior Functioning/Environment Level of Independence: Needs assistance  Gait / Transfers Assistance Needed: recently needed assistance from husband     Comments: pt works as an Occupational hygienist Problem List: Decreased strength;Decreased activity tolerance;Impaired balance (sitting and/or standing);Decreased  cognition;Decreased knowledge of use of DME or AE;Impaired UE functional use      OT Treatment/Interventions: Self-care/ADL training;DME and/or AE instruction;Balance training;Patient/family education;Cognitive remediation/compensation;Therapeutic activities;Energy conservation;Therapeutic exercise    OT Goals(Current goals can be found in the care plan section) Acute Rehab OT Goals Patient Stated Goal: none stated; agreeable to therapy OT Goal Formulation: With patient Time For Goal Achievement: 08/12/18 Potential to Achieve Goals: Good ADL Goals Pt Will Transfer to Toilet: with mod assist;bedside commode;with +2 assist Additional ADL Goal #1: pt will perform bed mobility with min A in preparation for adls Additional ADL Goal #2: pt will perform self feeding, grooming, and UB adls with min A Additional ADL Goal #3: pt will go from sit to stand with mod A and maintain for 2 minutes with min guard for adls Additional ADL Goal #4: pt will perform 2 sets 10 AA/AROM exercises to increase strength for adls  OT Frequency: Min 2X/week   Barriers to D/C:            Co-evaluation PT/OT/SLP Co-Evaluation/Treatment: Yes Reason for Co-Treatment: For patient/therapist safety PT goals addressed during session: Mobility/safety with mobility;Balance OT goals addressed during session: ADL's and self-care      AM-PAC OT "6 Clicks" Daily Activity     Outcome Measure Help from another person eating meals?: A Little Help from another person taking care of personal grooming?: A Little Help from another person toileting, which includes using toliet, bedpan, or urinal?: Total Help from another person bathing (including washing, rinsing, drying)?: A Lot Help from another person to put on and taking off regular upper body clothing?: A Lot Help from another person to put on and taking off regular lower body clothing?: Total 6 Click Score: 12   End of Session Nurse Communication: (pt on  bedpan)  Activity Tolerance: Patient tolerated treatment well Patient left: in bed;with call bell/phone within reach;with bed alarm set  OT Visit Diagnosis: Muscle weakness (generalized) (M62.81)                Time: 4742-5956 OT Time Calculation (min): 28 min Charges:  OT General Charges $OT Visit: 1 Visit OT Evaluation $OT Eval Moderate Complexity: 1 Mod  Lesle Chris, OTR/L Acute Rehabilitation Services [phone] [facility] pager [phone] office 07/29/2018  [place] 07/29/2018, 1:02 PM

## 2018-07-29 NOTE — Progress Notes (Addendum)
Patient ID: Emily Hicks, female   DOB: 11/10/1985, 33 y.o.   MRN: [id number]    Progress Note   Subjective   T 102.2 last pm CMV IGM +-greater than 240 BC neg so far HIV, HSV pending EBV pend  Patient still feels weak, denies any abdominal pain or nausea.  She was able to take some clear liquids and says her mouth is less sore today more willing to try full liquid   Objective   Vital signs in last 24 hours: Temp:  [98 F (36.7 C)-102.2 F (39 C)] 98.6 F (37 C) (06/25 0600) Pulse Rate:  [98-131] 98 (06/25 0400) Resp:  [18-32] 19 (06/25 0700) BP: (107-147)/(70-105) 136/91 (06/25 0700) SpO2:  [92 %-98 %] 96 % (06/25 0400)   General:   African-American female in NAD-ill-appearing, tachycardic-mouth-with significant plaques, less exudate Heart: Tachy regular rate and rhythm; no murmurs Lungs: Respirations even and unlabored, lungs CTA bilaterally Abdomen:  Soft, nontender and nondistended. Normal bowel sounds. Extremities:  Without edema. Neurologic:  Alert and oriented,  grossly normal neurologically. Psych:  Cooperative. Normal mood and affect.  Intake/Output from previous day: 06/24 0701 - 06/25 0700 In: 4056 [P.O.:480; I.V.:2179; IV Piggyback:1397] Out: 1150 [Urine:1150] Intake/Output this shift: No intake/output data recorded.  Lab Results: Recent Labs    07/27/18 1248 07/28/18 1349 07/29/18 0219  WBC 2.5* 5.3 4.1  HGB 8.9* 7.7* 7.1*  HCT 29.8* 25.5* 23.8*  PLT 265 192 192   BMET Recent Labs    07/27/18 1248 07/28/18 1349 07/29/18 0219  NA 157* 157* 156*  K 3.2* 3.0* 2.9*  CL 124* >130* 130*  CO2 23 21* 20*  GLUCOSE 120* 123* 108*  BUN 26* 20 18  CREATININE 1.21* 0.94 0.90  CALCIUM 8.2* 7.3* 7.3*   LFT Recent Labs    07/29/18 0219  PROT 5.6*  ALBUMIN 1.9*  AST 117*  ALT 41  ALKPHOS 46  BILITOT 0.6   PT/INR No results for input(s): LABPROT, INR in the last 72 hours.  Studies/Results: Dg Chest 2 View  Result Date: 07/27/2018 CLINICAL  DATA:  Fever and malaise EXAM: CHEST - 2 VIEW COMPARISON:  July 16, 2018 FINDINGS: Lungs are clear. The heart size and pulmonary vascularity are normal. No adenopathy. No bone lesions. IMPRESSION: No edema or consolidation. Electronically Signed   By: Lowella Grip III M.D.   On: 07/27/2018 15:00   Mr Brain Wo Contrast  Result Date: 07/28/2018 CLINICAL DATA:  Encephalopathy with hallucinations EXAM: MRI HEAD WITHOUT CONTRAST TECHNIQUE: Multiplanar, multiecho pulse sequences of the brain and surrounding structures were obtained without intravenous contrast. COMPARISON:  Brain MRI 03/24/2018 FINDINGS: BRAIN: There is no acute infarct, acute hemorrhage or extra-axial collection. The midline structures are normal. The white matter signal is normal for the patient's age. The cerebral and cerebellar volume are age-appropriate. No hydrocephalus. Susceptibility-sensitive sequences show no chronic microhemorrhage or superficial siderosis. No midline shift or other mass effect. VASCULAR: The major intracranial arterial and venous sinus flow voids are normal. SKULL AND UPPER CERVICAL SPINE: Calvarial bone marrow signal is normal. There is no skull base mass. Visualized upper cervical spine and soft tissues are normal. SINUSES/ORBITS: No fluid levels or advanced mucosal thickening. No mastoid or middle ear effusion. The orbits are normal. IMPRESSION: Normal brain. Electronically Signed   By: Ulyses Jarred M.D.   On: 07/28/2018 00:13       Assessment / Plan:    #20 33 year old African-American female with new diagnosis of MS made about  3 months ago, patient started on immunomodulators/Aubagio in early June Admitted with fever, weakness lethargy and altered mental status with general decline over the past 2 weeks with poor oral intake.  Patient met sepsis criteria.  Cultures negative so far CMV IgM is positive--think he may have an acute CMV infection-likely septated by immunosuppression with Aubagio  #2  acute oral pharyngeal candidiasis with odynophagia--some improvement in the past 24 hours with IV Diflucan, oral Mycostatin and oral lidocaine  #3 acute kidney injury-resolved #4 leukopenia resolved, persistent anemia-possibly drug-induced secondary to Aubagio #5-elevated ALT  Plan Advance to full liquid diet Continue IV Diflucan will need treatment x14 days total can convert to oral when taking p.o. as well Continue viscous lidocaine as needed and nystatin oral suspension 4 times daily Not plan endoscopic evaluation as long as candidiasis symptoms are improving  Consider ID consult for management of acute CMV/ganciclovir       Principal Problem:   Sepsis (HCC) Active Problems:   MS (multiple sclerosis) (HCC)   Tachycardia   Leukopenia   AKI (acute kidney injury) (HCC)   Hypernatremia   Altered mental status   Fever   AMS (altered mental status)   Acute metabolic encephalopathy   Hypokalemia     LOS: 2 days   Amy Esterwood  07/29/2018, 8:49 AM     Attending physician's note   I have reviewed the chart. I agree with the Advanced Practitioner's note, impression and recommendations.   Odynophagia has improved.  CMV IgM is positive in setting of immunosuppression.  Continue current treatment.  Consider ID consultation.  No indication for EGD currently.  May be more detrimental.   Raj , MD [facility] [phone].   

## 2018-07-30 ENCOUNTER — Encounter (HOSPITAL_COMMUNITY): Payer: Self-pay | Admitting: Anesthesiology

## 2018-07-30 LAB — COMPREHENSIVE METABOLIC PANEL
ALT: 31 U/L (ref 0–44)
AST: 83 U/L — ABNORMAL HIGH (ref 15–41)
Albumin: 1.8 g/dL — ABNORMAL LOW (ref 3.5–5.0)
Alkaline Phosphatase: 47 U/L (ref 38–126)
Anion gap: 6 (ref 5–15)
BUN: 10 mg/dL (ref 6–20)
CO2: 18 mmol/L — ABNORMAL LOW (ref 22–32)
Calcium: 7.1 mg/dL — ABNORMAL LOW (ref 8.9–10.3)
Chloride: 122 mmol/L — ABNORMAL HIGH (ref 98–111)
Creatinine, Ser: 0.69 mg/dL (ref 0.44–1.00)
GFR calc Af Amer: >60 mL/min (ref >60)
GFR calc non Af Amer: >60 mL/min (ref >60)
Glucose, Bld: 103 mg/dL — ABNORMAL HIGH (ref 70–99)
Potassium: 3.4 mmol/L — ABNORMAL LOW (ref 3.5–5.1)
Sodium: 146 mmol/L — ABNORMAL HIGH (ref 135–145)
Total Bilirubin: 0.4 mg/dL (ref 0.3–1.2)
Total Protein: 5.6 g/dL — ABNORMAL LOW (ref 6.5–8.1)

## 2018-07-30 LAB — CBC WITH DIFFERENTIAL/PLATELET
Abs Immature Granulocytes: 0.04 10*3/uL (ref 0.00–0.07)
Basophils Absolute: 0 10*3/uL (ref 0.0–0.1)
Basophils Relative: 0 %
Eosinophils Absolute: 0 10*3/uL (ref 0.0–0.5)
Eosinophils Relative: 0 %
HCT: 23 % — ABNORMAL LOW (ref 36.0–46.0)
Hemoglobin: 7.2 g/dL — ABNORMAL LOW (ref 12.0–15.0)
Immature Granulocytes: 2 %
Lymphocytes Relative: 15 %
Lymphs Abs: 0.4 10*3/uL — ABNORMAL LOW (ref 0.7–4.0)
MCH: 27.2 pg (ref 26.0–34.0)
MCHC: 31.3 g/dL (ref 30.0–36.0)
MCV: 86.8 fL (ref 80.0–100.0)
Monocytes Absolute: 0.1 10*3/uL (ref 0.1–1.0)
Monocytes Relative: 4 %
Neutro Abs: 2.2 10*3/uL (ref 1.7–7.7)
Neutrophils Relative %: 79 %
Platelets: 172 10*3/uL (ref 150–400)
RBC: 2.65 MIL/uL — ABNORMAL LOW (ref 3.87–5.11)
RDW: 17.1 % — ABNORMAL HIGH (ref 11.5–15.5)
WBC: 2.7 10*3/uL — ABNORMAL LOW (ref 4.0–10.5)
nRBC: 1.1 % — ABNORMAL HIGH (ref 0.0–0.2)

## 2018-07-30 LAB — BASIC METABOLIC PANEL
Anion gap: 6 (ref 5–15)
BUN: 13 mg/dL (ref 6–20)
CO2: 17 mmol/L — ABNORMAL LOW (ref 22–32)
Calcium: 7.3 mg/dL — ABNORMAL LOW (ref 8.9–10.3)
Chloride: 126 mmol/L — ABNORMAL HIGH (ref 98–111)
Creatinine, Ser: 0.69 mg/dL (ref 0.44–1.00)
GFR calc Af Amer: >60 mL/min (ref >60)
GFR calc non Af Amer: >60 mL/min (ref >60)
Glucose, Bld: 122 mg/dL — ABNORMAL HIGH (ref 70–99)
Potassium: 2.8 mmol/L — ABNORMAL LOW (ref 3.5–5.1)
Sodium: 149 mmol/L — ABNORMAL HIGH (ref 135–145)

## 2018-07-30 MED ORDER — MAGNESIUM SULFATE 2 GM/50ML IV SOLN
2.0000 g | Freq: Once | INTRAVENOUS | Status: AC
  Administered 2018-07-30: 2 g via INTRAVENOUS
  Filled 2018-07-30: qty 50

## 2018-07-30 MED ORDER — POTASSIUM CHLORIDE 10 MEQ/100ML IV SOLN
10.0000 meq | INTRAVENOUS | Status: AC
  Administered 2018-07-30 (×6): 10 meq via INTRAVENOUS
  Filled 2018-07-30 (×6): qty 100

## 2018-07-30 NOTE — Progress Notes (Signed)
PT Cancellation Note  Patient Details Name: CATIA TODOROV MRN: [id number] DOB: 23-Sep-1985   Cancelled Treatment:    Reason Eval/Treat Not Completed: Fatigue/lethargy limiting ability to participate - Pt just received bath and per RN, pt is very fatigued and deferred OOB mobility. Pt sleeping upon PT arrival. PT to check back as schedule allows.  Julien Girt, PT Acute Rehabilitation Services Pager [phone]  Office [phone]    Roxine Caddy D Elonda Husky 07/30/2018, 12:58 PM

## 2018-07-30 NOTE — Progress Notes (Signed)
Patient ID: Emily Hicks, female   DOB: 1985-11-16, 33 y.o.   MRN: [id number]    Progress Note   Subjective  Patient states she feels a bit better, mouth is still a little sore though improved, she denies any odynophagia, and says she is able to swallow much better but is not hungry.  No complaints of abdominal pain or nausea.   Objective   Vital signs in last 24 hours: Temp:  [98.4 F (36.9 C)-99.6 F (37.6 C)] 99.6 F (37.6 C) (06/26 0800) Pulse Rate:  [100-119] 106 (06/26 0700) Resp:  [16-29] 24 (06/26 0700) BP: (125-141)/(83-101) 134/97 (06/26 0700) SpO2:  [94 %-100 %] 96 % (06/26 0700)   African-American female in NAD weak and ill-appearing-mouth-improving, less exudate still has some plaques on the palate Heart:  Regular rate and rhythm; no murmurs Lungs: Respirations even and unlabored, lungs CTA bilaterally Abdomen:  Soft, nontender and nondistended. Normal bowel sounds. Extremities:  Without edema. Neurologic:  Alert and oriented, Psych:  Cooperative. Normal mood and affect.  Intake/Output from previous day: 06/25 0701 - 06/26 0700 In: 1618.4 [I.V.:1618.4] Out: 500 [Urine:500] Intake/Output this shift: No intake/output data recorded.  Lab Results: Recent Labs    07/28/18 1349 07/29/18 0219 07/30/18 0605  WBC 5.3 4.1 2.7*  HGB 7.7* 7.1* 7.2*  HCT 25.5* 23.8* 23.0*  PLT 192 192 172   BMET Recent Labs    07/29/18 1317 07/29/18 1502 07/30/18 0605  NA 159* 156* 149*  K 3.8 3.3* 2.8*  CL >130* 130* 126*  CO2 17* 19* 17*  GLUCOSE 88 80 122*  BUN 18 17 13   CREATININE 0.98 0.80 0.69  CALCIUM 7.6* 7.5* 7.3*   LFT Recent Labs    07/29/18 0219  PROT 5.6*  ALBUMIN 1.9*  AST 117*  ALT 41  ALKPHOS 46  BILITOT 0.6   PT/INR No results for input(s): LABPROT, INR in the last 72 hours.     Assessment / Plan:    #34 33 year old African-American female with new diagnosis of MS made about 3 months ago.  She started on immunomodulators/Aubagio in early  June Patient was admitted with fever weakness, lethargy, altered mental status and general decline over 2 weeks prior to admission with poor intake  Patient diagnosed with sepsis Blood cultures are negative CMV IgM positive-she may have acute CMV infection exacerbated by immunosuppression with Aubagio  #2 acute oral pharyngeal candidiasis with odynophagia-he is improving on IV Diflucan, oral Mycostatin and viscous lidocaine.  #3 acute kidney injury-resolved #4 anemia and neutropenia-likely drug-induced-stable- #5 elevated ALT-again likely drug-induced versus secondary to acute infection  Plan; Advance diet as tolerated Continue IV Diflucan till she is taking p.o.'s well then convert to p.o. and complete a 14-day course Continue oral Mycostatin 4 times daily x14 days Viscous lidocaine AC as needed No plans for endoscopic evaluation at this time as she is improving with treatment .  GI will sign off, available if can help.           Principal Problem:   Sepsis ([place]) Active Problems:   MS (multiple sclerosis) ([facility])   Tachycardia   Leukopenia   AKI (acute kidney injury) ([place])   Hypernatremia   Altered mental status   Fever   AMS (altered mental status)   Acute metabolic encephalopathy   Hypokalemia   Candida esophagitis ([place])     LOS: 3 days      07/30/2018, 8:59 AM

## 2018-07-30 NOTE — Progress Notes (Signed)
PROGRESS NOTE  Emily Hicks ZOX:[id number] DOB: 07-Jan-1986 DOA: 07/27/2018 PCP: Leland HerYoo, Elsia J, DO  HPI/Recap of past 24 hours:  Fever coming down,  Last fever 102.2 at midnight 6/24/6/25 , tachycardia has much improved,  Less oral thrush, less Odynophagia,  no cough, no hypoxia Denies headache, no neck pain or rigidity  Appear a littler bit more engaged in conversation, but still significant delayed in reaction and remain generalized weakness  Assessment/Plan: Principal Problem:   Sepsis ([facility]) Active Problems:   MS (multiple sclerosis) (H[facility])   Tachycardia   Leukopenia   AKI (acute kidney injury) ([facility])   Hypernatremia   Altered mental status   Fever   AMS (altered mental status)   Acute metabolic encephalopathy   Hypokalemia   Candida esophagitis ([facility])   Sepsis /acute metabolic encephalopathy likely due to oropharyngeal candidiasis + esophageal candida -mrsa screening negative, SARS cov2 screening negative, Blood culture negative, urine culture negative, cxr no acute infiltrate, she denies headache, no nuchal rigidity, mri brain no acute findings - she appears is improving on  iv diflucan, topical nystatin and iv cefepime --CMV Igm elevated at 240, case discussed with ID Dr Orvan Falconerampbell who recommend continue current treatment course since patient appear is improving, per Dr Wess Bottsampell elevated Igm could be nonspecific  -hsv prc in process, EBV IgG+ but IgM- -hiv/RPR negative, -GI and infectious disease input appreciated   Hypernatremia: Likely due to dehydration,  Did not improve with LR and oral free water, now improved after stated on d5 Repeat bmp this pm, further fluids management per repeat bmp  AKI Likely prerenal  Continue hydration, cr normalized  Hypokalemia:remain low, continue to replace k, mag 1.8 , will give 2g mag per iv  abnormal lft Side effect from [place]? Monitor, liver us in persists GI following  Anemia:  no overt sign of bleeding Possible due  to aubagio Monitor  Transfuse if hgb less than 7   H/O MS -stopped Aubagio a week ago due to cytopenia and abnormal liver function -Mri brain no acute findings -Per neurology, likely pseudo exacerbation due to acute illness ( sepsis)   Generalized weakness PT eval  Code Status: full  Family Communication: patient   Disposition Plan: remain in stepdown   Consultants:  GI  ID  CIR  Procedures:  none  Antibiotics:  vanc x1 on presentation  Cefepime from presentation   Objective: BP 126/83   Pulse (!) 109   Temp 98.6 F (37 C) (Oral)   Resp 16   Ht 5\' 1"  (1.549 m)   Wt 83.3 kg   SpO2 99%   BMI 34.70 kg/m   Intake/Output Summary (Last 24 hours) at 07/30/2018 0725 Last data filed at 07/29/2018 2230 Gross per 24 hour  Intake -  Output 250 ml  Net -250 ml   Filed Weights   07/27/18 1745  Weight: 83.3 kg    Exam: Patient is examined daily including today on 07/30/2018, exams remain the same as of yesterday except that has changed    General:  Weak, diffuse oropharygeal thrush , on tongue, on palate   Cardiovascular: sinus tachycardia much improved  Respiratory: CTABL  Abdomen: Soft/ND/NT, positive BS  Musculoskeletal: No Edema  Neuro: alert, appear slow in answering questions but oriented x3, generalized weakness  Data Reviewed: Basic Metabolic Panel: Recent Labs  Lab 07/28/18 1349 07/29/18 0219 07/29/18 1317 07/29/18 1502 07/30/18 0605  NA 157* 156* 159* 156* 149*  K 3.0* 2.9* 3.8 3.3* 2.8*  CL >130*  130* >130* 130* 126*  CO2 21* 20* 17* 19* 17*  GLUCOSE 123* 108* 88 80 122*  BUN 20 18 18 17 13   CREATININE 0.94 0.90 0.98 0.80 0.69  CALCIUM 7.3* 7.3* 7.6* 7.5* 7.3*  MG  --  1.8  --   --   --    Liver Function Tests: Recent Labs  Lab 07/27/18 1248 07/28/18 1349 07/29/18 0219  AST 169* 142* 117*  ALT 52* 48* 41  ALKPHOS 56 47 46  BILITOT 0.5 0.3 0.6  PROT 7.3 6.0* 5.6*  ALBUMIN 2.6* 2.1* 1.9*   No results for input(s):  LIPASE, AMYLASE in the last 168 hours. No results for input(s): AMMONIA in the last 168 hours. CBC: Recent Labs  Lab 07/27/18 1248 07/28/18 1349 07/29/18 0219 07/30/18 0605  WBC 2.5* 5.3 4.1 2.7*  NEUTROABS 2.0  --  3.5 2.2  HGB 8.9* 7.7* 7.1* 7.2*  HCT 29.8* 25.5* 23.8* 23.0*  MCV 88.4 91.1 89.5 86.8  PLT 265 192 192 172   Cardiac Enzymes:   No results for input(s): CKTOTAL, CKMB, CKMBINDEX, TROPONINI in the last 168 hours. BNP (last 3 results) No results for input(s): BNP in the last 8760 hours.  ProBNP (last 3 results) No results for input(s): PROBNP in the last 8760 hours.  CBG: No results for input(s): GLUCAP in the last 168 hours.  Recent Results (from the past 240 hour(s))  Blood Culture (routine x 2)     Status: None (Preliminary result)   Collection Time: 07/27/18  2:04 PM   Specimen: BLOOD  Result Value Ref Range Status   Specimen Description   Final    BLOOD LEFT ANTECUBITAL Performed at [facility], [address] [place], [place] [address]    Special Requests   Final    BOTTLES DRAWN AEROBIC AND ANAEROBIC Blood Culture adequate volume Performed at [facility], [address] [place], [place] [address]    Culture   Final    NO GROWTH 2 DAYS Performed at [facility], [address] [place], [place] [address]    Report Status PENDING  Incomplete  Blood Culture (routine x 2)     Status: None (Preliminary result)   Collection Time: 07/27/18  2:09 PM   Specimen: BLOOD  Result Value Ref Range Status   Specimen Description   Final    BLOOD RIGHT ANTECUBITAL Performed at [facility], [address] [place], [place] [address]    Special Requests   Final    BOTTLES DRAWN AEROBIC AND ANAEROBIC Blood Culture adequate volume Performed at [facility], [address] [place], [place] [address]    Culture   Final    NO GROWTH 2 DAYS Performed at [facility],  [address] [place], [place] [address]    Report Status PENDING  Incomplete  SARS Coronavirus 2 (CEPHEID - Performed in [facility] hospital lab), Hosp Order     Status: None   Collection Time: 07/27/18  2:34 PM   Specimen: Nasopharyngeal Swab  Result Value Ref Range Status   SARS Coronavirus 2 NEGATIVE NEGATIVE Final    Comment: (NOTE) If result is NEGATIVE SARS-CoV-2 target nucleic acids are NOT DETECTED. The SARS-CoV-2 RNA is generally detectable in upper and lower  respiratory specimens during the acute phase of infection. The lowest  concentration of SARS-CoV-2 viral copies this assay can detect is 250  copies / mL. A negative result does not  preclude SARS-CoV-2 infection  and should not be used as the sole basis for treatment or other  patient management decisions.  A negative result may occur with  improper specimen collection / handling, submission of specimen other  than nasopharyngeal swab, presence of viral mutation(s) within the  areas targeted by this assay, and inadequate number of viral copies  (<250 copies / mL). A negative result must be combined with clinical  observations, patient history, and epidemiological information. If result is POSITIVE SARS-CoV-2 target nucleic acids are DETECTED. The SARS-CoV-2 RNA is generally detectable in upper and lower  respiratory specimens dur ing the acute phase of infection.  Positive  results are indicative of active infection with SARS-CoV-2.  Clinical  correlation with patient history and other diagnostic information is  necessary to determine patient infection status.  Positive results do  not rule out bacterial infection or co-infection with other viruses. If result is PRESUMPTIVE POSTIVE SARS-CoV-2 nucleic acids MAY BE PRESENT.   A presumptive positive result was obtained on the submitted specimen  and confirmed on repeat testing.  While 2019 novel coronavirus  (SARS-CoV-2) nucleic acids may be present in the submitted  sample  additional confirmatory testing may be necessary for epidemiological  and / or clinical management purposes  to differentiate between  SARS-CoV-2 and other Sarbecovirus currently known to infect humans.  If clinically indicated additional testing with an alternate test  methodology [phone]) is advised. The SARS-CoV-2 RNA is generally  detectable in upper and lower respiratory sp ecimens during the acute  phase of infection. The expected result is Negative. Fact Sheet for Patients:  BoilerBrush.com.cy Fact Sheet for Healthcare Providers: https://pope.com/ This test is not yet approved or cleared by the [place] FDA and has been authorized for detection and/or diagnosis of SARS-CoV-2 by FDA under an Emergency Use Authorization (EUA).  This EUA will remain in effect (meaning this test can be used) for the duration of the COVID-19 declaration under Section 564(b)(1) of the Act, 21 U.S.C. section 360bbb-3(b)(1), unless the authorization is terminated or revoked sooner. Performed at [facility], [address], [place], [place] [address]   Urine culture     Status: None   Collection Time: 07/27/18  5:01 PM   Specimen: Urine, Catheterized  Result Value Ref Range Status   Specimen Description   Final    URINE, CATHETERIZED Performed at [facility], [address] [place], [place] [address]    Special Requests   Final    NONE Performed at [facility], [address] [place], [place] [address]    Culture   Final    NO GROWTH Performed at [facility], [address] [place], [place] [address]    Report Status 07/28/2018 FINAL  Final  MRSA PCR Screening     Status: None   Collection Time: 07/27/18  5:39 PM   Specimen: Nasal Mucosa; Nasopharyngeal  Result Value Ref Range Status   MRSA by PCR NEGATIVE NEGATIVE Final    Comment:        The GeneXpert MRSA  Assay (FDA approved for NASAL specimens only), is one component of a comprehensive MRSA colonization surveillance program. It is not intended to diagnose MRSA infection nor to guide or monitor treatment for MRSA infections. Performed at [facility], [address], [place], [place] [address]      Studies: No results found.  Scheduled Meds: . Chlorhexidine Gluconate Cloth  6 each Topical Q0600  .  feeding supplement  1 Container Oral BID BM  . feeding supplement (ENSURE ENLIVE)  237 mL Oral BID BM  . free water  300 mL Oral Q8H  . heparin  5,000 Units Subcutaneous Q8H  . lidocaine  15 mL Mouth/Throat TID AC & HS  . multivitamin with minerals  1 tablet Oral Daily  . nystatin  5 mL Oral QID  . sodium chloride flush  3 mL Intravenous Once    Continuous Infusions: . ceFEPime (MAXIPIME) IV Stopped (07/30/18 0520)  . dextrose 110 mL/hr at 07/29/18 1611  . fluconazole (DIFLUCAN) IV Stopped (07/29/18 1146)  . magnesium sulfate bolus IVPB    . potassium chloride       Time spent: 31mins I have personally reviewed and interpreted on  07/30/2018 daily labs, tele strips, imagings as discussed above under date review session and assessment and plans.  I reviewed all nursing notes, pharmacy notes, consultant notes,  vitals, pertinent old records  I have discussed plan of care as described above with RN , patient on 07/30/2018   Florencia Reasons MD, PhD  [facility] Pager [phone]. If 7PM-7AM, please contact night-coverage at [email or web address], password [email or web address] 07/30/2018, 7:25 AM  LOS: 3 days

## 2018-07-30 NOTE — Progress Notes (Signed)
Inpatient Rehab Admissions:  Inpatient Rehab Consult received.  I met with patient at the bedside for rehabilitation assessment and to discuss goals and expectations of an inpatient rehab admission.  She is very flat.  Agreeable to CIR if needed.  I will open up a case with her insurance and look to admit early next week pending bed availability, insurance authorization, and confirmation of 24/7 supervision at home.   Signed: Shann Medal, PT, DPT Admissions Coordinator [phone] 07/30/18  4:51 PM

## 2018-07-30 NOTE — Progress Notes (Signed)
Patient ID: Emily Hicks, female   DOB: 12-Dec-1985, 33 y.o.   MRN: [id number]   Brief GI note  Dr Annamaria Boots Arlean Hopping  Has requested we continue to follow pt.   She has + CMV IGM , so some question regarding possibility of CMV esophagitis  ID has recommended observation for now, will need EGD with Bx if fails to improve  ???CMV esophagitis   GI will see again Monday.

## 2018-07-31 LAB — COMPREHENSIVE METABOLIC PANEL
ALT: 33 U/L (ref 0–44)
AST: 86 U/L — ABNORMAL HIGH (ref 15–41)
Albumin: 1.7 g/dL — ABNORMAL LOW (ref 3.5–5.0)
Alkaline Phosphatase: 50 U/L (ref 38–126)
Anion gap: 6 (ref 5–15)
BUN: 10 mg/dL (ref 6–20)
CO2: 17 mmol/L — ABNORMAL LOW (ref 22–32)
Calcium: 7.2 mg/dL — ABNORMAL LOW (ref 8.9–10.3)
Chloride: 121 mmol/L — ABNORMAL HIGH (ref 98–111)
Creatinine, Ser: 0.69 mg/dL (ref 0.44–1.00)
GFR calc Af Amer: >60 mL/min (ref >60)
GFR calc non Af Amer: >60 mL/min (ref >60)
Glucose, Bld: 99 mg/dL (ref 70–99)
Potassium: 3.4 mmol/L — ABNORMAL LOW (ref 3.5–5.1)
Sodium: 144 mmol/L (ref 135–145)
Total Bilirubin: 0.2 mg/dL — ABNORMAL LOW (ref 0.3–1.2)
Total Protein: 5.5 g/dL — ABNORMAL LOW (ref 6.5–8.1)

## 2018-07-31 LAB — CBC WITH DIFFERENTIAL/PLATELET
Abs Immature Granulocytes: 0.11 10*3/uL — ABNORMAL HIGH (ref 0.00–0.07)
Basophils Absolute: 0 10*3/uL (ref 0.0–0.1)
Basophils Relative: 0 %
Eosinophils Absolute: 0 10*3/uL (ref 0.0–0.5)
Eosinophils Relative: 0 %
HCT: 22.8 % — ABNORMAL LOW (ref 36.0–46.0)
Hemoglobin: 7.2 g/dL — ABNORMAL LOW (ref 12.0–15.0)
Immature Granulocytes: 4 %
Lymphocytes Relative: 19 %
Lymphs Abs: 0.5 10*3/uL — ABNORMAL LOW (ref 0.7–4.0)
MCH: 26.4 pg (ref 26.0–34.0)
MCHC: 31.6 g/dL (ref 30.0–36.0)
MCV: 83.5 fL (ref 80.0–100.0)
Monocytes Absolute: 0.1 10*3/uL (ref 0.1–1.0)
Monocytes Relative: 4 %
Neutro Abs: 2 10*3/uL (ref 1.7–7.7)
Neutrophils Relative %: 73 %
Platelets: 151 10*3/uL (ref 150–400)
RBC: 2.73 MIL/uL — ABNORMAL LOW (ref 3.87–5.11)
RDW: 16.8 % — ABNORMAL HIGH (ref 11.5–15.5)
WBC: 2.8 10*3/uL — ABNORMAL LOW (ref 4.0–10.5)
nRBC: 1.1 % — ABNORMAL HIGH (ref 0.0–0.2)

## 2018-07-31 LAB — HERPES SIMPLEX VIRUS(HSV) DNA BY PCR: HSV 2 DNA: NEGATIVE

## 2018-07-31 LAB — MAGNESIUM: Magnesium: 1.7 mg/dL (ref 1.7–2.4)

## 2018-07-31 LAB — GLUCOSE, CAPILLARY
Glucose-Capillary: 94 mg/dL (ref 70–99)
Glucose-Capillary: 85 mg/dL (ref 70–99)

## 2018-07-31 MED ORDER — MAGNESIUM SULFATE 2 GM/50ML IV SOLN
2.0000 g | Freq: Once | INTRAVENOUS | Status: AC
  Administered 2018-07-31: 2 g via INTRAVENOUS
  Filled 2018-07-31: qty 50

## 2018-07-31 MED ORDER — POTASSIUM CHLORIDE 10 MEQ/100ML IV SOLN
10.0000 meq | INTRAVENOUS | Status: AC
  Administered 2018-07-31 (×4): 10 meq via INTRAVENOUS
  Filled 2018-07-31 (×4): qty 100

## 2018-07-31 NOTE — Progress Notes (Signed)
PROGRESS NOTE  Emily Hicks IBB:[id number] DOB: March 21, 1985 DOA: 07/27/2018 PCP: Leland Her, DO  HPI/Recap of past 24 hours:  Still has intermittent fever, fever 101.1 at 3am Last fever 102.2 at midnight 6/24/6/25 ,persistent  tachycardia but improved Less oral thrush, reports Odynophagia has resolved,  no cough, no hypoxia Denies headache, no neck pain or rigidity  She is  more engaged in conversation, but still significant delayed in reaction and remain generalized weakness  Assessment/Plan: Principal Problem:   Severe sepsis (HCC) Active Problems:   MS (multiple sclerosis) (HCC)   Tachycardia   Leukopenia   AKI (acute kidney injury) (HCC)   Hypernatremia   Altered mental status   Fever   AMS (altered mental status)   Acute metabolic encephalopathy   Hypokalemia   Candida esophagitis (HCC)   Sepsis /acute metabolic encephalopathy likely due to oropharyngeal candidiasis + esophageal candida -mrsa screening negative, SARS cov2 screening negative, Blood culture negative, urine culture negative, cxr no acute infiltrate, she denies headache, no nuchal rigidity, mri brain no acute findings - she appears is improving on  iv diflucan, topical nystatin and iv cefepime --CMV Igm elevated at 240, case discussed with ID Dr Orvan Falconer who recommend continue current treatment course since patient appear is improving, per Dr Wess Botts elevated Igm could be nonspecific  -hsv prc in process, EBV IgG+ but IgM- -hiv/RPR negative, -GI and infectious disease input appreciated -she reports feeling better, oral thrush has much improved, odynophagia resolved -if continue spike fever, will formally consult ID   Hypernatremia: Likely due to dehydration,  Resolved after LR and d5 Now off hydration, encourage oral fluids intake   AKI Likely prerenal  Continue hydration, cr normalized  Hypokalemia:remain low, continue to replace k, mag 1.7 , will give 2g mag per iv  abnormal lft Side effect  from [place]? Monitor, liver US in persists GI following  Anemia:  no overt sign of bleeding Possible due to aubagio Monitor  Transfuse if hgb less than 7   H/O MS -stopped Aubagio a week ago due to cytopenia and abnormal liver function -Mri brain no acute findings -Per neurology, likely pseudo exacerbation due to acute illness ( sepsis)   Generalized weakness PT eval/CIR placement  Code Status: full  Family Communication: patient   Disposition Plan: remain in stepdown Inpatient rehab Monday or Tuesday  Consultants:  GI  ID  CIR  Procedures:  none  Antibiotics:  vanc x1 on presentation  Cefepime from presentation   Objective: BP 133/81    Pulse (!) 120    Temp 99.9 F (37.7 C)    Resp (!) 25    Ht 5\' 1"  (1.549 m)    Wt 89 kg    SpO2 97%    BMI 37.07 kg/m   Intake/Output Summary (Last 24 hours) at 07/31/2018 0824 Last data filed at 07/31/2018 0500 Gross per 24 hour  Intake 2353.04 ml  Output 1750 ml  Net 603.04 ml   Filed Weights   07/27/18 1745 07/31/18 0300  Weight: 83.3 kg 89 kg    Exam: Patient is examined daily including today on 07/31/2018, exams remain the same as of yesterday except that has changed    General:  Appear stronger and more interactive ,oropharygeal thrush has much improved,   Cardiovascular: sinus tachycardia much improved  Respiratory: CTABL  Abdomen: Soft/ND/NT, positive BS  Musculoskeletal: No Edema  Neuro: alert, appear slow in answering questions but oriented x3, generalized weakness  Data Reviewed: Basic Metabolic Panel: Recent  Labs  Lab 07/29/18 0219 07/29/18 1317 07/29/18 1502 07/30/18 0605 07/30/18 1320 07/31/18 0221  NA 156* 159* 156* 149* 146* 144  K 2.9* 3.8 3.3* 2.8* 3.4* 3.4*  CL 130* >130* 130* 126* 122* 121*  CO2 20* 17* 19* 17* 18* 17*  GLUCOSE 108* 88 80 122* 103* 99  BUN 18 18 17 13 10 10   CREATININE 0.90 0.98 0.80 0.69 0.69 0.69  CALCIUM 7.3* 7.6* 7.5* 7.3* 7.1* 7.2*  MG 1.8  --   --    --   --  1.7   Liver Function Tests: Recent Labs  Lab 07/27/18 1248 07/28/18 1349 07/29/18 0219 07/30/18 1320 07/31/18 0221  AST 169* 142* 117* 83* 86*  ALT 52* 48* 41 31 33  ALKPHOS 56 47 46 47 50  BILITOT 0.5 0.3 0.6 0.4 0.2*  PROT 7.3 6.0* 5.6* 5.6* 5.5*  ALBUMIN 2.6* 2.1* 1.9* 1.8* 1.7*   No results for input(s): LIPASE, AMYLASE in the last 168 hours. No results for input(s): AMMONIA in the last 168 hours. CBC: Recent Labs  Lab 07/27/18 1248 07/28/18 1349 07/29/18 0219 07/30/18 0605 07/31/18 0221  WBC 2.5* 5.3 4.1 2.7* 2.8*  NEUTROABS 2.0  --  3.5 2.2 2.0  HGB 8.9* 7.7* 7.1* 7.2* 7.2*  HCT 29.8* 25.5* 23.8* 23.0* 22.8*  MCV 88.4 91.1 89.5 86.8 83.5  PLT 265 192 192 172 151   Cardiac Enzymes:   No results for input(s): CKTOTAL, CKMB, CKMBINDEX, TROPONINI in the last 168 hours. BNP (last 3 results) No results for input(s): BNP in the last 8760 hours.  ProBNP (last 3 results) No results for input(s): PROBNP in the last 8760 hours.  CBG: No results for input(s): GLUCAP in the last 168 hours.  Recent Results (from the past 240 hour(s))  Blood Culture (routine x 2)     Status: None (Preliminary result)   Collection Time: 07/27/18  2:04 PM   Specimen: BLOOD  Result Value Ref Range Status   Specimen Description   Final    BLOOD LEFT ANTECUBITAL Performed at [facility], [address] [place], [place] [address]    Special Requests   Final    BOTTLES DRAWN AEROBIC AND ANAEROBIC Blood Culture adequate volume Performed at [facility], [address] [place], [place] [address]    Culture   Final    NO GROWTH 4 DAYS Performed at [facility], [address] [place], [place] [address]    Report Status PENDING  Incomplete  Blood Culture (routine x 2)     Status: None (Preliminary result)   Collection Time: 07/27/18  2:09 PM   Specimen: BLOOD  Result Value Ref Range Status   Specimen Description   Final     BLOOD RIGHT ANTECUBITAL Performed at [facility], [address] [place], [place] [address]    Special Requests   Final    BOTTLES DRAWN AEROBIC AND ANAEROBIC Blood Culture adequate volume Performed at [facility], [address] [place], [place] [address]    Culture   Final    NO GROWTH 4 DAYS Performed at [facility], [address] [place], [place] [address]    Report Status PENDING  Incomplete  SARS Coronavirus 2 (CEPHEID - Performed in [facility] hospital lab), Hosp Order     Status: None   Collection Time: 07/27/18  2:34 PM   Specimen: Nasopharyngeal Swab  Result Value Ref Range Status  SARS Coronavirus 2 NEGATIVE NEGATIVE Final    Comment: (NOTE) If result is NEGATIVE SARS-CoV-2 target nucleic acids are NOT DETECTED. The SARS-CoV-2 RNA is generally detectable in upper and lower  respiratory specimens during the acute phase of infection. The lowest  concentration of SARS-CoV-2 viral copies this assay can detect is 250  copies / mL. A negative result does not preclude SARS-CoV-2 infection  and should not be used as the sole basis for treatment or other  patient management decisions.  A negative result may occur with  improper specimen collection / handling, submission of specimen other  than nasopharyngeal swab, presence of viral mutation(s) within the  areas targeted by this assay, and inadequate number of viral copies  (<250 copies / mL). A negative result must be combined with clinical  observations, patient history, and epidemiological information. If result is POSITIVE SARS-CoV-2 target nucleic acids are DETECTED. The SARS-CoV-2 RNA is generally detectable in upper and lower  respiratory specimens dur ing the acute phase of infection.  Positive  results are indicative of active infection with SARS-CoV-2.  Clinical  correlation with patient history and other diagnostic information is  necessary to determine patient  infection status.  Positive results do  not rule out bacterial infection or co-infection with other viruses. If result is PRESUMPTIVE POSTIVE SARS-CoV-2 nucleic acids MAY BE PRESENT.   A presumptive positive result was obtained on the submitted specimen  and confirmed on repeat testing.  While 2019 novel coronavirus  (SARS-CoV-2) nucleic acids may be present in the submitted sample  additional confirmatory testing may be necessary for epidemiological  and / or clinical management purposes  to differentiate between  SARS-CoV-2 and other Sarbecovirus currently known to infect humans.  If clinically indicated additional testing with an alternate test  methodology [id number]) is advised. The SARS-CoV-2 RNA is generally  detectable in upper and lower respiratory sp ecimens during the acute  phase of infection. The expected result is Negative. Fact Sheet for Patients:  StrictlyIdeas.no Fact Sheet for Healthcare Providers: BankingDealers.co.za This test is not yet approved or cleared by the [place] FDA and has been authorized for detection and/or diagnosis of SARS-CoV-2 by FDA under an Emergency Use Authorization (EUA).  This EUA will remain in effect (meaning this test can be used) for the duration of the COVID-19 declaration under Section 564(b)(1) of the Act, 21 U.S.C. section 360bbb-3(b)(1), unless the authorization is terminated or revoked sooner. Performed at [facility], [place] [address], [place], [place] [address]   Urine culture     Status: None   Collection Time: 07/27/18  5:01 PM   Specimen: Urine, Catheterized  Result Value Ref Range Status   Specimen Description   Final    URINE, CATHETERIZED Performed at [place] [address], [place], [place] [address]    Special Requests   Final    NONE Performed at [facility], [place] [address], [place], [place] [address]      Culture   Final    NO GROWTH Performed at [facility], [place] [address], [place], [place] [address]    Report Status 07/28/2018 FINAL  Final  MRSA PCR Screening     Status: None   Collection Time: 07/27/18  5:39 PM   Specimen: Nasal Mucosa; Nasopharyngeal  Result Value Ref Range Status   MRSA by PCR NEGATIVE NEGATIVE Final    Comment:        The GeneXpert MRSA Assay (FDA approved  for NASAL specimens only), is one component of a comprehensive MRSA colonization surveillance program. It is not intended to diagnose MRSA infection nor to guide or monitor treatment for MRSA infections. Performed at [facility], [address] [place], [place] [address]      Studies: No results found.  Scheduled Meds:  Chlorhexidine Gluconate Cloth  6 each Topical Q0600   feeding supplement  1 Container Oral BID BM   feeding supplement (ENSURE ENLIVE)  237 mL Oral BID BM   free water  300 mL Oral Q8H   heparin  5,000 Units Subcutaneous Q8H   lidocaine  15 mL Mouth/Throat TID AC & HS   multivitamin with minerals  1 tablet Oral Daily   nystatin  5 mL Oral QID   sodium chloride flush  3 mL Intravenous Once    Continuous Infusions:  ceFEPime (MAXIPIME) IV Stopped (07/31/18 0430)   fluconazole (DIFLUCAN) IV Stopped (07/30/18 1215)   magnesium sulfate bolus IVPB     potassium chloride       Time spent: 35mins I have personally reviewed and interpreted on  07/31/2018 daily labs, tele strips, imagings as discussed above under date review session and assessment and plans.  I reviewed all nursing notes, pharmacy notes, consultant notes,  vitals, pertinent old records  I have discussed plan of care as described above with RN , patient on 07/31/2018   Albertine GratesFang Mita Vallo MD, PhD  [facility] Pager [phone]. If 7PM-7AM, please contact night-coverage at [email or web address], password [email or web address] 07/31/2018, 8:24 AM  LOS: 4 days

## 2018-08-01 ENCOUNTER — Inpatient Hospital Stay (HOSPITAL_COMMUNITY): Payer: 59

## 2018-08-01 ENCOUNTER — Encounter (HOSPITAL_COMMUNITY): Payer: Self-pay

## 2018-08-01 DIAGNOSIS — B3781 Candidal esophagitis: Secondary | ICD-10-CM

## 2018-08-01 DIAGNOSIS — R509 Fever, unspecified: Secondary | ICD-10-CM

## 2018-08-01 DIAGNOSIS — D649 Anemia, unspecified: Secondary | ICD-10-CM

## 2018-08-01 DIAGNOSIS — G35 Multiple sclerosis: Secondary | ICD-10-CM

## 2018-08-01 DIAGNOSIS — E43 Unspecified severe protein-calorie malnutrition: Secondary | ICD-10-CM

## 2018-08-01 LAB — CBC WITH DIFFERENTIAL/PLATELET
Abs Immature Granulocytes: 0.11 10*3/uL — ABNORMAL HIGH (ref 0.00–0.07)
Basophils Absolute: 0 10*3/uL (ref 0.0–0.1)
Basophils Relative: 0 %
Eosinophils Absolute: 0 10*3/uL (ref 0.0–0.5)
Eosinophils Relative: 0 %
HCT: 20.5 % — ABNORMAL LOW (ref 36.0–46.0)
Hemoglobin: 6.5 g/dL — CL (ref 12.0–15.0)
Immature Granulocytes: 4 %
Lymphocytes Relative: 19 %
Lymphs Abs: 0.6 10*3/uL — ABNORMAL LOW (ref 0.7–4.0)
MCH: 26.7 pg (ref 26.0–34.0)
MCHC: 31.7 g/dL (ref 30.0–36.0)
MCV: 84.4 fL (ref 80.0–100.0)
Monocytes Absolute: 0.1 10*3/uL (ref 0.1–1.0)
Monocytes Relative: 5 %
Neutro Abs: 2.1 10*3/uL (ref 1.7–7.7)
Neutrophils Relative %: 72 %
Platelets: 145 10*3/uL — ABNORMAL LOW (ref 150–400)
RBC: 2.43 MIL/uL — ABNORMAL LOW (ref 3.87–5.11)
RDW: 16.7 % — ABNORMAL HIGH (ref 11.5–15.5)
WBC: 2.9 10*3/uL — ABNORMAL LOW (ref 4.0–10.5)
nRBC: 1 % — ABNORMAL HIGH (ref 0.0–0.2)

## 2018-08-01 LAB — MAGNESIUM: Magnesium: 1.7 mg/dL (ref 1.7–2.4)

## 2018-08-01 LAB — RETICULOCYTES
Immature Retic Fract: 40.4 % — ABNORMAL HIGH (ref 2.3–15.9)
RBC.: 2.57 MIL/uL — ABNORMAL LOW (ref 3.87–5.11)
Retic Count, Absolute: 36.8 10*3/uL (ref 19.0–186.0)
Retic Ct Pct: 1.4 % (ref 0.4–3.1)

## 2018-08-01 LAB — PROTIME-INR
INR: 1.4 — ABNORMAL HIGH (ref 0.8–1.2)
Prothrombin Time: 17.4 seconds — ABNORMAL HIGH (ref 11.4–15.2)

## 2018-08-01 LAB — COMPREHENSIVE METABOLIC PANEL
ALT: 36 U/L (ref 0–44)
AST: 98 U/L — ABNORMAL HIGH (ref 15–41)
Albumin: 1.7 g/dL — ABNORMAL LOW (ref 3.5–5.0)
Alkaline Phosphatase: 63 U/L (ref 38–126)
Anion gap: 6 (ref 5–15)
BUN: 10 mg/dL (ref 6–20)
CO2: 17 mmol/L — ABNORMAL LOW (ref 22–32)
Calcium: 7.1 mg/dL — ABNORMAL LOW (ref 8.9–10.3)
Chloride: 120 mmol/L — ABNORMAL HIGH (ref 98–111)
Creatinine, Ser: 0.62 mg/dL (ref 0.44–1.00)
GFR calc Af Amer: >60 mL/min (ref >60)
GFR calc non Af Amer: >60 mL/min (ref >60)
Glucose, Bld: 90 mg/dL (ref 70–99)
Potassium: 3.5 mmol/L (ref 3.5–5.1)
Sodium: 143 mmol/L (ref 135–145)
Total Bilirubin: 0.3 mg/dL (ref 0.3–1.2)
Total Protein: 5.4 g/dL — ABNORMAL LOW (ref 6.5–8.1)

## 2018-08-01 LAB — VITAMIN B12: Vitamin B-12: 534 pg/mL (ref 180–914)

## 2018-08-01 LAB — ABO/RH: ABO/RH(D): O POS

## 2018-08-01 LAB — CULTURE, BLOOD (ROUTINE X 2)
Culture: NO GROWTH
Culture: NO GROWTH
Special Requests: ADEQUATE
Special Requests: ADEQUATE

## 2018-08-01 LAB — PREPARE RBC (CROSSMATCH)

## 2018-08-01 LAB — HEMOGLOBIN AND HEMATOCRIT, BLOOD
HCT: 22.2 % — ABNORMAL LOW (ref 36.0–46.0)
HCT: 26.2 % — ABNORMAL LOW (ref 36.0–46.0)
Hemoglobin: 6.8 g/dL — CL (ref 12.0–15.0)
Hemoglobin: 8.5 g/dL — ABNORMAL LOW (ref 12.0–15.0)

## 2018-08-01 LAB — FOLATE: Folate: 4 ng/mL — ABNORMAL LOW (ref >5.9)

## 2018-08-01 MED ORDER — IOHEXOL 300 MG/ML  SOLN
30.0000 mL | Freq: Once | INTRAMUSCULAR | Status: AC | PRN
  Administered 2018-08-01: 30 mL via ORAL

## 2018-08-01 MED ORDER — METOPROLOL TARTRATE 5 MG/5ML IV SOLN
2.5000 mg | INTRAVENOUS | Status: DC | PRN
  Administered 2018-08-02 – 2018-08-13 (×12): 2.5 mg via INTRAVENOUS
  Filled 2018-08-01 (×11): qty 5

## 2018-08-01 MED ORDER — IOHEXOL 300 MG/ML  SOLN
100.0000 mL | Freq: Once | INTRAMUSCULAR | Status: AC | PRN
  Administered 2018-08-01: 100 mL via INTRAVENOUS

## 2018-08-01 MED ORDER — SODIUM CHLORIDE 0.9% FLUSH: 10.0000 mL | INTRAVENOUS | Status: DC | PRN

## 2018-08-01 MED ORDER — METOPROLOL TARTRATE 5 MG/5ML IV SOLN: 5.0000 mg | Freq: Once | INTRAVENOUS | Status: DC

## 2018-08-01 MED ORDER — FOLIC ACID 1 MG PO TABS
1.0000 mg | ORAL_TABLET | Freq: Every day | ORAL | Status: DC
  Administered 2018-08-01 – 2018-08-02 (×2): 1 mg via ORAL
  Filled 2018-08-01 (×2): qty 1

## 2018-08-01 MED ORDER — POTASSIUM CHLORIDE 20 MEQ/15ML (10%) PO SOLN
40.0000 meq | Freq: Once | ORAL | Status: AC
  Administered 2018-08-01: 09:00:00 40 meq via ORAL
  Filled 2018-08-01: qty 30

## 2018-08-01 MED ORDER — MAGNESIUM SULFATE 2 GM/50ML IV SOLN
2.0000 g | Freq: Once | INTRAVENOUS | Status: AC
  Administered 2018-08-01: 2 g via INTRAVENOUS
  Filled 2018-08-01: qty 50

## 2018-08-01 MED ORDER — CHLORHEXIDINE GLUCONATE CLOTH 2 % EX PADS
6.0000 | MEDICATED_PAD | Freq: Every day | CUTANEOUS | Status: DC
  Administered 2018-08-02 – 2018-08-04 (×3): 6 via TOPICAL

## 2018-08-01 MED ORDER — METOPROLOL TARTRATE 12.5 MG HALF TABLET
12.5000 mg | ORAL_TABLET | Freq: Two times a day (BID) | ORAL | Status: DC
  Administered 2018-08-01 – 2018-08-04 (×6): 12.5 mg via ORAL
  Filled 2018-08-01 (×6): qty 1

## 2018-08-01 MED ORDER — SODIUM CHLORIDE (PF) 0.9 % IJ SOLN
INTRAMUSCULAR | Status: AC
  Filled 2018-08-01: qty 50

## 2018-08-01 MED ORDER — SODIUM CHLORIDE 0.9% IV SOLUTION: Freq: Once | INTRAVENOUS | Status: DC

## 2018-08-01 NOTE — Progress Notes (Signed)
CRITICAL VALUE ALERT  Critical Value:  Hgb 6.8  Date & Time Notied:  08/01/18 1200  Provider Notified: Erlinda Hong  Orders Received/Actions taken: 1 unit RBC waiting to be given

## 2018-08-01 NOTE — Consult Note (Addendum)
[facility]    Date of Admission:  07/27/2018   Total days of antibiotics: 4 cefepime               Reason for Consult: fever    Referring Provider: Roda Shutters   Assessment: Fever CMV IgM+  MS Protein Calorie malnutrition, severe  Plan: 1. Would check CT chest/abd/pelvis 2. Would consider EGD with Bx to eval for CMV 3. Will send CMV PCR on blood 4. Await MRI of spine 5. Will stop cefepime as it does not seem to be affecting her temp  Comment- Etiology of her fever is unclear- she has negative CXR and multiple negative Cx. She has been on immunosuppressant for most of this month. Could be from MS or even her previous therapy.  I would consider having neuro see her to consider a trial of steroids.   Thank you so much for this interesting consult,  Principal Problem:   Severe sepsis (HCC) Active Problems:   MS (multiple sclerosis) (HCC)   Tachycardia   Leukopenia   AKI (acute kidney injury) (HCC)   Hypernatremia   Altered mental status   Fever   AMS (altered mental status)   Acute metabolic encephalopathy   Hypokalemia   Candida esophagitis (HCC)   Hypomagnesemia   . sodium chloride   Intravenous Once  . Chlorhexidine Gluconate Cloth  6 each Topical Daily  . feeding supplement  1 Container Oral BID BM  . feeding supplement (ENSURE ENLIVE)  237 mL Oral BID BM  . folic acid  1 mg Oral Daily  . free water  300 mL Oral Q8H  . heparin  5,000 Units Subcutaneous Q8H  . lidocaine  15 mL Mouth/Throat TID AC & HS  . metoprolol tartrate  12.5 mg Oral BID  . nystatin  5 mL Oral QID  . sodium chloride flush  3 mL Intravenous Once    HPI: Emily Hicks is a 33 y.o. female with hx of Multiple Sclerosis dx 04-2018 (started on Aubagio, last week prior to admit), comes to ED on 6-23 with mental status change, weakness, fever for 1 week.  During this period she has been having post-prandial emesis.  In ED she was noted to have Na 157 and K 3.2. WBC  2.5. Her temp was 102.9. She was found to have candida esophagitis, started on diflucan/vanco/cefepime. Her CXR and MRI head were both normal.  By 6-24 her Cx remained negative and her vanco was stopped. Her LFTs showed AST 117, ALT 41, and CMV IgM was > 240. She was seen by GI who suggested acute CMV infection. Endoscopy deferred.  She continued to have fever 101-102 nearly daily.  Her AST was 98 today.   BCx 6-23/24 (-) UCx 6-23 (-)  Review of Systems: Review of Systems  Constitutional: Positive for fever and malaise/fatigue.  Respiratory: Negative for cough.   Gastrointestinal: Negative for constipation and diarrhea.  Genitourinary: Negative for dysuria.  Neurological: Positive for sensory change.  incontinence Please see HPI. All other systems reviewed and negative.   Past Medical History:  Diagnosis Date  . Gallstones   . GERD (gastroesophageal reflux disease)    only prn OTC occasionally  . Headache   . Multiple sclerosis (HCC)   . Numbness and tingling of left leg   . Numbness and tingling of right leg     Social History   Tobacco Use  . Smoking status: Never Smoker  .  Smokeless tobacco: Never Used  Substance Use Topics  . Alcohol use: No  . Drug use: No    Family History  Problem Relation Age of Onset  . Hypertension Father   . Hypertension Mother   . Hypertension Sister   . Diabetes Maternal Grandmother   . Diabetes Maternal Grandfather      Medications:  Scheduled: . sodium chloride   Intravenous Once  . Chlorhexidine Gluconate Cloth  6 each Topical Daily  . feeding supplement  1 Container Oral BID BM  . feeding supplement (ENSURE ENLIVE)  237 mL Oral BID BM  . folic acid  1 mg Oral Daily  . free water  300 mL Oral Q8H  . heparin  5,000 Units Subcutaneous Q8H  . lidocaine  15 mL Mouth/Throat TID AC & HS  . metoprolol tartrate  12.5 mg Oral BID  . nystatin  5 mL Oral QID  . sodium chloride flush  3 mL Intravenous Once    Abtx:   Anti-infectives (From admission, onward)   Start     Dose/Rate Route Frequency Ordered Stop   07/28/18 1700  vancomycin (VANCOCIN) IVPB 750 mg/150 ml premix  Status:  Discontinued     750 mg 150 mL/hr over 60 Minutes Intravenous Every 24 hours 07/27/18 1606 07/28/18 0947   07/28/18 1000  fluconazole (DIFLUCAN) IVPB 200 mg     200 mg 100 mL/hr over 60 Minutes Intravenous Every 24 hours 07/28/18 0954     07/27/18 1800  ceFEPIme (MAXIPIME) 2 g in sodium chloride 0.9 % 100 mL IVPB     2 g 200 mL/hr over 30 Minutes Intravenous Every 8 hours 07/27/18 1606     07/27/18 1730  fluconazole (DIFLUCAN) tablet 200 mg  Status:  Discontinued     200 mg Oral  Once 07/27/18 1717 07/28/18 0953   07/27/18 1615  vancomycin (VANCOCIN) 1,500 mg in sodium chloride 0.9 % 500 mL IVPB     1,500 mg 250 mL/hr over 120 Minutes Intravenous  Once 07/27/18 1606 07/27/18 1851   07/27/18 1415  cefTRIAXone (ROCEPHIN) 1 g in sodium chloride 0.9 % 100 mL IVPB     1 g 200 mL/hr over 30 Minutes Intravenous  Once 07/27/18 1404 07/27/18 1642        OBJECTIVE: Blood pressure 136/89, pulse (!) 114, temperature (!) 102.1 F (38.9 C), temperature source Oral, resp. rate (!) 23, height 5\' 1"  (1.549 m), weight 89 kg, SpO2 100 %.  Physical Exam Constitutional:      Appearance: Normal appearance.  HENT:     Mouth/Throat:     Mouth: Mucous membranes are moist.     Pharynx: No oropharyngeal exudate.  Eyes:     Extraocular Movements: Extraocular movements intact.     Pupils: Pupils are equal, round, and reactive to light.  Neck:     Comments: Mild resistance to ROM Cardiovascular:     Rate and Rhythm: Tachycardia present.  Pulmonary:     Effort: Pulmonary effort is normal.     Breath sounds: Normal breath sounds.  Abdominal:     General: Bowel sounds are normal. There is no distension.     Palpations: Abdomen is soft.     Tenderness: There is no abdominal tenderness.  Musculoskeletal:     Right lower leg: No edema.      Left lower leg: No edema.  Neurological:     Mental Status: She is alert and oriented to person, place, and time.  Psychiatric:  Mood and Affect: Mood normal.     Lab Results Results for orders placed or performed during the hospital encounter of 07/27/18 (from the past 48 hour(s))  Comprehensive metabolic panel     Status: Abnormal   Collection Time: 07/30/18  1:20 PM  Result Value Ref Range   Sodium 146 (H) 135 - 145 mmol/L   Potassium 3.4 (L) 3.5 - 5.1 mmol/L    Comment: DELTA CHECK NOTED REPEATED TO VERIFY NO VISIBLE HEMOLYSIS    Chloride 122 (H) 98 - 111 mmol/L   CO2 18 (L) 22 - 32 mmol/L   Glucose, Bld 103 (H) 70 - 99 mg/dL   BUN 10 6 - 20 mg/dL   Creatinine, Ser 0.69 0.44 - 1.00 mg/dL   Calcium 7.1 (L) 8.9 - 10.3 mg/dL   Total Protein 5.6 (L) 6.5 - 8.1 g/dL   Albumin 1.8 (L) 3.5 - 5.0 g/dL   AST 83 (H) 15 - 41 U/L   ALT 31 0 - 44 U/L   Alkaline Phosphatase 47 38 - 126 U/L   Total Bilirubin 0.4 0.3 - 1.2 mg/dL   GFR calc non Af Amer >60 >60 mL/min   GFR calc Af Amer >60 >60 mL/min   Anion gap 6 5 - 15    Comment: Performed at [facility], [place] [address], [place], [place] [address]  CBC with Differential/Platelet     Status: Abnormal   Collection Time: 07/31/18  2:21 AM  Result Value Ref Range   WBC 2.8 (L) 4.0 - 10.5 K/uL    Comment: REPEATED TO VERIFY WHITE COUNT CONFIRMED ON SMEAR    RBC 2.73 (L) 3.87 - 5.11 MIL/uL   Hemoglobin 7.2 (L) 12.0 - 15.0 g/dL    Comment: REPEATED TO VERIFY   HCT 22.8 (L) 36.0 - 46.0 %   MCV 83.5 80.0 - 100.0 fL   MCH 26.4 26.0 - 34.0 pg   MCHC 31.6 30.0 - 36.0 g/dL   RDW 16.8 (H) 11.5 - 15.5 %   Platelets 151 150 - 400 K/uL   nRBC 1.1 (H) 0.0 - 0.2 %   Neutrophils Relative % 73 %   Neutro Abs 2.0 1.7 - 7.7 K/uL   Lymphocytes Relative 19 %   Lymphs Abs 0.5 (L) 0.7 - 4.0 K/uL   Monocytes Relative 4 %   Monocytes Absolute 0.1 0.1 - 1.0 K/uL   Eosinophils Relative 0 %   Eosinophils Absolute 0.0 0.0  - 0.5 K/uL   Basophils Relative 0 %   Basophils Absolute 0.0 0.0 - 0.1 K/uL   WBC Morphology DOHLE BODIES     Comment: TOXIC GRANULATION   Immature Granulocytes 4 %   Abs Immature Granulocytes 0.11 (H) 0.00 - 0.07 K/uL   Ovalocytes PRESENT     Comment: Performed at [facility], [place] [address], [place], [place] [address]  Comprehensive metabolic panel     Status: Abnormal   Collection Time: 07/31/18  2:21 AM  Result Value Ref Range   Sodium 144 135 - 145 mmol/L   Potassium 3.4 (L) 3.5 - 5.1 mmol/L   Chloride 121 (H) 98 - 111 mmol/L   CO2 17 (L) 22 - 32 mmol/L   Glucose, Bld 99 70 - 99 mg/dL   BUN 10 6 - 20 mg/dL   Creatinine, Ser 0.69 0.44 - 1.00 mg/dL   Calcium 7.2 (L) 8.9 - 10.3 mg/dL   Total Protein 5.5 (L) 6.5 - 8.1 g/dL   Albumin  1.7 (L) 3.5 - 5.0 g/dL   AST 86 (H) 15 - 41 U/L   ALT 33 0 - 44 U/L   Alkaline Phosphatase 50 38 - 126 U/L   Total Bilirubin 0.2 (L) 0.3 - 1.2 mg/dL   GFR calc non Af Amer >60 >60 mL/min   GFR calc Af Amer >60 >60 mL/min   Anion gap 6 5 - 15    Comment: Performed at [facility], [address] [place], [place] [address]  Magnesium     Status: None   Collection Time: 07/31/18  2:21 AM  Result Value Ref Range   Magnesium 1.7 1.7 - 2.4 mg/dL    Comment: Performed at [facility], [address], [place], [place] [address]  Glucose, capillary     Status: None   Collection Time: 07/31/18 11:54 AM  Result Value Ref Range   Glucose-Capillary 94 70 - 99 mg/dL  Glucose, capillary     Status: None   Collection Time: 07/31/18  4:30 PM  Result Value Ref Range   Glucose-Capillary 85 70 - 99 mg/dL  CBC with Differential/Platelet     Status: Abnormal   Collection Time: 08/01/18  2:55 AM  Result Value Ref Range   WBC 2.9 (L) 4.0 - 10.5 K/uL   RBC 2.43 (L) 3.87 - 5.11 MIL/uL   Hemoglobin 6.5 (LL) 12.0 - 15.0 g/dL    Comment: REPEATED TO VERIFY THIS CRITICAL RESULT HAS VERIFIED AND BEEN CALLED TO  MCMILLIAN,S BY KENNEDY JACKSON ON 06 28 2020 AT 0451, AND HAS BEEN READ BACK. CRITICAL RESULT VERIFIED    HCT 20.5 (L) 36.0 - 46.0 %   MCV 84.4 80.0 - 100.0 fL   MCH 26.7 26.0 - 34.0 pg   MCHC 31.7 30.0 - 36.0 g/dL   RDW 09.8 (H) 11.9 - 14.7 %   Platelets 145 (L) 150 - 400 K/uL   nRBC 1.0 (H) 0.0 - 0.2 %   Neutrophils Relative % 72 %   Neutro Abs 2.1 1.7 - 7.7 K/uL   Lymphocytes Relative 19 %   Lymphs Abs 0.6 (L) 0.7 - 4.0 K/uL   Monocytes Relative 5 %   Monocytes Absolute 0.1 0.1 - 1.0 K/uL   Eosinophils Relative 0 %   Eosinophils Absolute 0.0 0.0 - 0.5 K/uL   Basophils Relative 0 %   Basophils Absolute 0.0 0.0 - 0.1 K/uL   WBC Morphology TOXIC GRANULATION    Immature Granulocytes 4 %   Abs Immature Granulocytes 0.11 (H) 0.00 - 0.07 K/uL   Polychromasia PRESENT     Comment: Performed at [facility], [address], [place], [place] [address]  Comprehensive metabolic panel     Status: Abnormal   Collection Time: 08/01/18  2:55 AM  Result Value Ref Range   Sodium 143 135 - 145 mmol/L   Potassium 3.5 3.5 - 5.1 mmol/L   Chloride 120 (H) 98 - 111 mmol/L   CO2 17 (L) 22 - 32 mmol/L   Glucose, Bld 90 70 - 99 mg/dL   BUN 10 6 - 20 mg/dL   Creatinine, Ser 2.13 0.44 - 1.00 mg/dL   Calcium 7.1 (L) 8.9 - 10.3 mg/dL   Total Protein 5.4 (L) 6.5 - 8.1 g/dL   Albumin 1.7 (L) 3.5 - 5.0 g/dL   AST 98 (H) 15 - 41 U/L   ALT 36 0 - 44 U/L   Alkaline Phosphatase 63 38 - 126 U/L   Total  Bilirubin 0.3 0.3 - 1.2 mg/dL   GFR calc non Af Amer >60 >60 mL/min   GFR calc Af Amer >60 >60 mL/min   Anion gap 6 5 - 15    Comment: Performed at [facility], [address] [place], [place] [address]  Magnesium     Status: None   Collection Time: 08/01/18  2:55 AM  Result Value Ref Range   Magnesium 1.7 1.7 - 2.4 mg/dL    Comment: Performed at [facility], [address] [place], [place] [address]  ABO/Rh     Status: None   Collection Time:  08/01/18 10:20 AM  Result Value Ref Range   ABO/RH(D)      O POS Performed at [facility], [address] [place], [place] [address]   Type and screen [facility]     Status: None (Preliminary result)   Collection Time: 08/01/18 10:21 AM  Result Value Ref Range   ABO/RH(D) O POS    Antibody Screen NEG    Sample Expiration 08/04/2018,2359    Unit Number [id number]    Blood Component Type RED CELLS,LR    Unit division 00    Status of Unit ALLOCATED    Transfusion Status OK TO TRANSFUSE    Crossmatch Result      Compatible Performed at [facility], [address], [place], [place] [address]   Prepare RBC     Status: None   Collection Time: 08/01/18 10:21 AM  Result Value Ref Range   Order Confirmation      ORDER PROCESSED BY BLOOD BANK Performed at [facility], [address], [place], [place] [address]   Hemoglobin and hematocrit, blood     Status: Abnormal   Collection Time: 08/01/18 10:21 AM  Result Value Ref Range   Hemoglobin 6.8 (LL) 12.0 - 15.0 g/dL    Comment: REPEATED TO VERIFY THIS CRITICAL RESULT HAS VERIFIED AND BEEN CALLED TO [facility] BY MARINDA BLACK ON 06 28 2020 AT 1154, AND HAS BEEN READ BACK. BAILEY BLACKBURN RN    HCT 22.2 (L) 36.0 - 46.0 %    Comment: Performed at [facility], [address] [place], [place] [address]  Protime-INR     Status: Abnormal   Collection Time: 08/01/18 10:21 AM  Result Value Ref Range   Prothrombin Time 17.4 (H) 11.4 - 15.2 seconds   INR 1.4 (H) 0.8 - 1.2    Comment: (NOTE) INR goal varies based on device and disease states. Performed at [facility], [address], [place], [place] [address]   Vitamin B12     Status: None   Collection Time: 08/01/18 10:21 AM  Result Value Ref Range   Vitamin B-12 534 180 - 914 pg/mL    Comment: (NOTE) This assay is not validated for testing neonatal or  myeloproliferative syndrome specimens for Vitamin B12 levels. Performed at [facility], [address] [place], [place] [address]   Folate     Status: Abnormal   Collection Time: 08/01/18 10:21 AM  Result Value Ref Range   Folate 4.0 (L) >5.9 ng/mL    Comment: Performed at [facility], [address] [place], [place] [address]  Reticulocytes     Status: Abnormal   Collection Time: 08/01/18 10:21 AM  Result Value Ref Range   Retic Ct Pct 1.4 0.4 - 3.1 %   RBC. 2.57 (L)  3.87 - 5.11 MIL/uL   Retic Count, Absolute 36.8 19.0 - 186.0 K/uL   Immature Retic Fract 40.4 (H) 2.3 - 15.9 %    Comment: Performed at [facility], [address] [place], [place] [address]      Component Value Date/Time   SDES  07/27/2018 1701    URINE, CATHETERIZED Performed at [facility], [address] [place], [place] [address]    SPECREQUEST  07/27/2018 1701    NONE Performed at [facility], [address] [place], [place] [address]    CULT  07/27/2018 1701    NO GROWTH Performed at [facility], [address] [place], [place] [address]    REPTSTATUS 07/28/2018 FINAL 07/27/2018 1701   No results found. Recent Results (from the past 240 hour(s))  Blood Culture (routine x 2)     Status: None   Collection Time: 07/27/18  2:04 PM   Specimen: BLOOD  Result Value Ref Range Status   Specimen Description   Final    BLOOD LEFT ANTECUBITAL Performed at [facility], [address] [place], [place] [address]    Special Requests   Final    BOTTLES DRAWN AEROBIC AND ANAEROBIC Blood Culture adequate volume Performed at [facility], [address] [place], [place] [address]    Culture   Final    NO GROWTH 5 DAYS Performed at [facility], [address] [place], [place] [address]    Report Status 08/01/2018 FINAL  Final  Blood Culture (routine x 2)      Status: None   Collection Time: 07/27/18  2:09 PM   Specimen: BLOOD  Result Value Ref Range Status   Specimen Description   Final    BLOOD RIGHT ANTECUBITAL Performed at [facility], [address] [place], [place] [address]    Special Requests   Final    BOTTLES DRAWN AEROBIC AND ANAEROBIC Blood Culture adequate volume Performed at [facility], [address] [place], [place] [address]    Culture   Final    NO GROWTH 5 DAYS Performed at [facility], [address] [place], [place] [address]    Report Status 08/01/2018 FINAL  Final  SARS Coronavirus 2 (CEPHEID - Performed in [facility] hospital lab), Hosp Order     Status: None   Collection Time: 07/27/18  2:34 PM   Specimen: Nasopharyngeal Swab  Result Value Ref Range Status   SARS Coronavirus 2 NEGATIVE NEGATIVE Final    Comment: (NOTE) If result is NEGATIVE SARS-CoV-2 target nucleic acids are NOT DETECTED. The SARS-CoV-2 RNA is generally detectable in upper and lower  respiratory specimens during the acute phase of infection. The lowest  concentration of SARS-CoV-2 viral copies this assay can detect is 250  copies / mL. A negative result does not preclude SARS-CoV-2 infection  and should not be used as the sole basis for treatment or other  patient management decisions.  A negative result may occur with  improper specimen collection / handling, submission of specimen other  than nasopharyngeal swab, presence of viral mutation(s) within the  areas targeted by this assay, and inadequate number of viral copies  (<250 copies / mL). A negative result must be combined with clinical  observations, patient history, and epidemiological information. If result is POSITIVE SARS-CoV-2 target nucleic acids are DETECTED. The SARS-CoV-2 RNA is generally detectable in upper and lower  respiratory specimens dur ing the acute phase of infection.  Positive  results are indicative of  active infection with SARS-CoV-2.  Clinical  correlation with patient history and other diagnostic information is  necessary to determine patient infection status.  Positive results do  not rule out bacterial infection or co-infection with other viruses. If result is PRESUMPTIVE POSTIVE SARS-CoV-2 nucleic acids MAY BE PRESENT.   A presumptive positive result was obtained on the submitted specimen  and confirmed on repeat testing.  While 2019 novel coronavirus  (SARS-CoV-2) nucleic acids may be present in the submitted sample  additional confirmatory testing may be necessary for epidemiological  and / or clinical management purposes  to differentiate between  SARS-CoV-2 and other Sarbecovirus currently known to infect humans.  If clinically indicated additional testing with an alternate test  methodology [phone]([id number]) is advised. The SARS-CoV-2 RNA is generally  detectable in upper and lower respiratory sp ecimens during the acute  phase of infection. The expected result is Negative. Fact Sheet for Patients:  BoilerBrush.com.cyhttps://www.fda.gov/media/136312/download Fact Sheet for Healthcare Providers: https://pope.com/https://www.fda.gov/media/136313/download This test is not yet approved or cleared by the [place] FDA and has been authorized for detection and/or diagnosis of SARS-CoV-2 by FDA under an Emergency Use Authorization (EUA).  This EUA will remain in effect (meaning this test can be used) for the duration of the COVID-19 declaration under Section 564(b)(1) of the Act, 21 U.S.C. section 360bbb-3(b)(1), unless the authorization is terminated or revoked sooner. Performed at [facility], [address] [place], [place] [address]   Urine culture     Status: None   Collection Time: 07/27/18  5:01 PM   Specimen: Urine, Catheterized  Result Value Ref Range Status   Specimen Description   Final    URINE, CATHETERIZED Performed at [facility], [address]  [place], [place] [address]    Special Requests   Final    NONE Performed at [facility], [address] [place], [place] [address]    Culture   Final    NO GROWTH Performed at [facility], [address] [place], [place] [address]    Report Status 07/28/2018 FINAL  Final  MRSA PCR Screening     Status: None   Collection Time: 07/27/18  5:39 PM   Specimen: Nasal Mucosa; Nasopharyngeal  Result Value Ref Range Status   MRSA by PCR NEGATIVE NEGATIVE Final    Comment:        The GeneXpert MRSA Assay (FDA approved for NASAL specimens only), is one component of a comprehensive MRSA colonization surveillance program. It is not intended to diagnose MRSA infection nor to guide or monitor treatment for MRSA infections. Performed at [facility], [address] [place], [place] [address]     Microbiology: Recent Results (from the past 240 hour(s))  Blood Culture (routine x 2)     Status: None   Collection Time: 07/27/18  2:04 PM   Specimen: BLOOD  Result Value Ref Range Status   Specimen Description   Final    BLOOD LEFT ANTECUBITAL Performed at [facility], [address] [place], [place] [address]    Special Requests   Final    BOTTLES DRAWN AEROBIC AND ANAEROBIC Blood Culture adequate volume Performed at [facility], [address] [place], [place] [address]    Culture   Final    NO GROWTH 5 DAYS Performed at [facility], [address] [place], [place] [address]    Report Status 08/01/2018 FINAL  Final  Blood Culture (routine x 2)     Status: None   Collection Time: 07/27/18  2:09 PM   Specimen: BLOOD  Result Value Ref Range Status   Specimen Description   Final    BLOOD RIGHT ANTECUBITAL Performed at [facility], [address] [place], [place] [address]    Special Requests   Final    BOTTLES DRAWN AEROBIC AND ANAEROBIC Blood Culture adequate volume  Performed at [facility], [address] [place], [place] [address]    Culture   Final    NO GROWTH 5 DAYS Performed at [facility], [address] [place], [place] [address]    Report Status 08/01/2018 FINAL  Final  SARS Coronavirus 2 (CEPHEID - Performed in [facility]), Hosp Order     Status: None   Collection Time: 07/27/18  2:34 PM   Specimen: Nasopharyngeal Swab  Result Value Ref Range Status   SARS Coronavirus 2 NEGATIVE NEGATIVE Final    Comment: (NOTE) If result is NEGATIVE SARS-CoV-2 target nucleic acids are NOT DETECTED. The SARS-CoV-2 RNA is generally detectable in upper and lower  respiratory specimens during the acute phase of infection. The lowest  concentration of SARS-CoV-2 viral copies this assay can detect is 250  copies / mL. A negative result does not preclude SARS-CoV-2 infection  and should not be used as the sole basis for treatment or other  patient management decisions.  A negative result may occur with  improper specimen collection / handling, submission of specimen other  than nasopharyngeal swab, presence of viral mutation(s) within the  areas targeted by this assay, and inadequate number of viral copies  (<250 copies / mL). A negative result must be combined with clinical  observations, patient history, and epidemiological information. If result is POSITIVE SARS-CoV-2 target nucleic acids are DETECTED. The SARS-CoV-2 RNA is generally detectable in upper and lower  respiratory specimens dur ing the acute phase of infection.  Positive  results are indicative of active infection with SARS-CoV-2.  Clinical  correlation with patient history and other diagnostic information is  necessary to determine patient infection status.  Positive results do  not rule out bacterial infection or co-infection with other viruses. If result is PRESUMPTIVE POSTIVE SARS-CoV-2 nucleic acids MAY BE PRESENT.   A presumptive positive  result was obtained on the submitted specimen  and confirmed on repeat testing.  While 2019 novel coronavirus  (SARS-CoV-2) nucleic acids may be present in the submitted sample  additional confirmatory testing may be necessary for epidemiological  and / or clinical management purposes  to differentiate between  SARS-CoV-2 and other Sarbecovirus currently known to infect humans.  If clinically indicated additional testing with an alternate test  methodology [phone]([id number]) is advised. The SARS-CoV-2 RNA is generally  detectable in upper and lower respiratory sp ecimens during the acute  phase of infection. The expected result is Negative. Fact Sheet for Patients:  BoilerBrush.com.cyhttps://www.fda.gov/media/136312/download Fact Sheet for Healthcare Providers: https://pope.com/https://www.fda.gov/media/136313/download This test is not yet approved or cleared by the [place] FDA and has been authorized for detection and/or diagnosis of SARS-CoV-2 by FDA under an Emergency Use Authorization (EUA).  This EUA will remain in effect (meaning this test can be used) for the duration of the COVID-19 declaration under Section 564(b)(1) of the Act, 21 U.S.C. section 360bbb-3(b)(1), unless the authorization is terminated or revoked sooner. Performed  at [facility], [address] [place], [place] [address]   Urine culture     Status: None   Collection Time: 07/27/18  5:01 PM   Specimen: Urine, Catheterized  Result Value Ref Range Status   Specimen Description   Final    URINE, CATHETERIZED Performed at [facility], [address] [place], [place] [address]    Special Requests   Final    NONE Performed at [facility], [address], [place], [place] [address]    Culture   Final    NO GROWTH Performed at [facility], [address], [place], [place] [address]    Report Status 07/28/2018 FINAL  Final  MRSA PCR Screening     Status: None   Collection Time:  07/27/18  5:39 PM   Specimen: Nasal Mucosa; Nasopharyngeal  Result Value Ref Range Status   MRSA by PCR NEGATIVE NEGATIVE Final    Comment:        The GeneXpert MRSA Assay (FDA approved for NASAL specimens only), is one component of a comprehensive MRSA colonization surveillance program. It is not intended to diagnose MRSA infection nor to guide or monitor treatment for MRSA infections. Performed at [facility], [address], [place], [place] [address]     Radiographs and labs were personally reviewed by me.   Johny Sax, MD [facility] [phone] 08/01/2018, 12:42 PM

## 2018-08-01 NOTE — Progress Notes (Addendum)
PROGRESS NOTE  Bishop DublinJamie J Vanderwoude XBJ:[id number] DOB: 1985-10-07 DOA: 07/27/2018 PCP: Leland HerYoo, Elsia J, DO  Brief history:   H/o MS off aubagio a week ago due to intolerance, presents with Confusion, weakness, loss of appetite, fever, sepsis MRI brain unremarkable  Found to have severe oropharyngeal thrush with odynophagia initially , treated with diflucan/cefepime Thrush has much improved, odynophagia has resolved, but still spike fever ID consulted MRI  lumber spine pending Hopefully able to go to CIR early next week if fever free and mri lumbar spine is unremarkable   HPI/Recap of past 24 hours:   She continue to spike fever, fever of 102.1  this am, , persistent sinus tachycardia and tachypnea, bp stable, no hypoxia hgb dropped, no overt sign of bleeding oral thrush greatly improved, reports Odynophagia has resolved,  no cough, no hypoxia, she starts to eat more Denies headache, no neck pain or rigidity  She is  more engaged in conversation, but still delayed in reaction and remain generalized weakness  Poor iv access, she is getting a midline placed today  Assessment/Plan: Principal Problem:   Severe sepsis (HCC) Active Problems:   MS (multiple sclerosis) (HCC)   Tachycardia   Leukopenia   AKI (acute kidney injury) (HCC)   Hypernatremia   Altered mental status   Fever   AMS (altered mental status)   Acute metabolic encephalopathy   Hypokalemia   Candida esophagitis (HCC)   Hypomagnesemia   Sepsis /acute metabolic encephalopathy likely due to oropharyngeal candidiasis + esophageal candida -mrsa screening negative, SARS cov2 screening negative, Blood culture negative, urine culture negative, cxr no acute infiltrate, she denies headache, no nuchal rigidity, mri brain no acute findings - she appears is improving on  iv diflucan, topical nystatin and iv cefepime --CMV Igm elevated at 240, case discussed with ID Dr Orvan Falconerampbell who recommend continue current treatment course since  patient appear is improving, per Dr Wess Bottsampell elevated Igm could be nonspecific  -hsv prc negative, EBV IgG+ but IgM- -hiv/RPR negative, -GI and infectious disease input appreciated -she reports feeling better, oral thrush has much improved, odynophagia resolved -however continue to spike fever, will formally consult ID, case discussed with Dr Ninetta LightsHatcher on 6/28   Hypernatremia: Likely due to dehydration,  Resolved after LR and d5 Now off hydration, encourage oral fluids intake   Hypokalemia:remain low, continue to replace k, mag 1.7 , will give 2g mag per iv  Sinus tachycardia: tsh unremarkable Will start low dose lopressor for heart rate control Get echocardiogram  AKI Likely prerenal  Continue hydration, cr normalized   abnormal lft Side effect from [place]? Monitor, liver us in persists GI following  Normocytic Anemia:  no overt sign of bleeding, retic count inappropriately low Possible due to aubagio Transfuse if hgb less than 7, prbc transfusion x1 on 6/28 Start folic acid supplement   H/O MS -stopped Aubagio a week ago due to cytopenia and abnormal liver function -Mri brain no acute findings -Per neurology Dr Laurence SlateAroor, generalized weakness likely pseudo exacerbation due to acute illness ( sepsis)   Generalized weakness Bilateral lower extremity appear weaker compare to upper extremity, will get mri lumber spine PT eval/CIR placement  Code Status: full  Family Communication: patient   Disposition Plan: remain in stepdown Inpatient rehab Monday or Tuesday if fever resolves and mri lumber spine unremarkable  Consultants:  GI  ID  CIR  Infectious disease  Procedures:  Midline placement   Antibiotics:  vanc x1 on presentation  Cefepime from presentation  Objective: BP 136/89   Pulse (!) 118   Temp 98.6 F (37 C) (Oral)   Resp (!) 22   Ht 5\' 1"  (1.549 m)   Wt 89 kg   SpO2 100%   BMI 37.07 kg/m   Intake/Output Summary (Last 24 hours) at  08/01/2018 0810 Last data filed at 08/01/2018 2122 Gross per 24 hour  Intake 902.46 ml  Output 750 ml  Net 152.46 ml   Filed Weights   07/27/18 1745 07/31/18 0300  Weight: 83.3 kg 89 kg    Exam: Patient is examined daily including today on 08/01/2018, exams remain the same as of yesterday except that has changed    General:  Appear stronger and more interactive ,oropharygeal thrush has much improved,   Cardiovascular: sinus tachycardia much improved  Respiratory: CTABL  Abdomen: Soft/ND/NT, positive BS  Musculoskeletal: No Edema  Neuro: alert, appear slow in answering questions but oriented x3, generalized weakness  Data Reviewed: Basic Metabolic Panel: Recent Labs  Lab 07/29/18 0219  07/29/18 1502 07/30/18 0605 07/30/18 1320 07/31/18 0221 08/01/18 0255  NA 156*   < > 156* 149* 146* 144 143  K 2.9*   < > 3.3* 2.8* 3.4* 3.4* 3.5  CL 130*   < > 130* 126* 122* 121* 120*  CO2 20*   < > 19* 17* 18* 17* 17*  GLUCOSE 108*   < > 80 122* 103* 99 90  BUN 18   < > 17 13 10 10 10   CREATININE 0.90   < > 0.80 0.69 0.69 0.69 0.62  CALCIUM 7.3*   < > 7.5* 7.3* 7.1* 7.2* 7.1*  MG 1.8  --   --   --   --  1.7 1.7   < > = values in this interval not displayed.   Liver Function Tests: Recent Labs  Lab 07/28/18 1349 07/29/18 0219 07/30/18 1320 07/31/18 0221 08/01/18 0255  AST 142* 117* 83* 86* 98*  ALT 48* 41 31 33 36  ALKPHOS 47 46 47 50 63  BILITOT 0.3 0.6 0.4 0.2* 0.3  PROT 6.0* 5.6* 5.6* 5.5* 5.4*  ALBUMIN 2.1* 1.9* 1.8* 1.7* 1.7*   No results for input(s): LIPASE, AMYLASE in the last 168 hours. No results for input(s): AMMONIA in the last 168 hours. CBC: Recent Labs  Lab 07/27/18 1248 07/28/18 1349 07/29/18 0219 07/30/18 0605 07/31/18 0221 08/01/18 0255  WBC 2.5* 5.3 4.1 2.7* 2.8* 2.9*  NEUTROABS 2.0  --  3.5 2.2 2.0 2.1  HGB 8.9* 7.7* 7.1* 7.2* 7.2* 6.5*  HCT 29.8* 25.5* 23.8* 23.0* 22.8* 20.5*  MCV 88.4 91.1 89.5 86.8 83.5 84.4  PLT 265 192 192 172 151  145*   Cardiac Enzymes:   No results for input(s): CKTOTAL, CKMB, CKMBINDEX, TROPONINI in the last 168 hours. BNP (last 3 results) No results for input(s): BNP in the last 8760 hours.  ProBNP (last 3 results) No results for input(s): PROBNP in the last 8760 hours.  CBG: Recent Labs  Lab 07/31/18 1154 07/31/18 1630  GLUCAP 94 85    Recent Results (from the past 240 hour(s))  Blood Culture (routine x 2)     Status: None (Preliminary result)   Collection Time: 07/27/18  2:04 PM   Specimen: BLOOD  Result Value Ref Range Status   Specimen Description   Final    BLOOD LEFT ANTECUBITAL Performed at [facility], [address], [place], [place] [address]    Special Requests   Final  BOTTLES DRAWN AEROBIC AND ANAEROBIC Blood Culture adequate volume Performed at [place] [address], [place], [place] [address]    Culture   Final    NO GROWTH 4 DAYS Performed at [facility], [place] [address] [place], [place] [address]    Report Status PENDING  Incomplete  Blood Culture (routine x 2)     Status: None (Preliminary result)   Collection Time: 07/27/18  2:09 PM   Specimen: BLOOD  Result Value Ref Range Status   Specimen Description   Final    BLOOD RIGHT ANTECUBITAL Performed at [place] [address] [place], [place] [address]    Special Requests   Final    BOTTLES DRAWN AEROBIC AND ANAEROBIC Blood Culture adequate volume Performed at [place] [address], [place], [place] [address]    Culture   Final    NO GROWTH 4 DAYS Performed at [facility], [place] [address] [place], [place] [address]    Report Status PENDING  Incomplete  SARS Coronavirus 2 (CEPHEID - Performed in [place] hospital lab), Hosp Order     Status: None   Collection Time: 07/27/18  2:34 PM   Specimen: Nasopharyngeal Swab  Result Value Ref Range Status   SARS Coronavirus 2 NEGATIVE NEGATIVE  Final    Comment: (NOTE) If result is NEGATIVE SARS-CoV-2 target nucleic acids are NOT DETECTED. The SARS-CoV-2 RNA is generally detectable in upper and lower  respiratory specimens during the acute phase of infection. The lowest  concentration of SARS-CoV-2 viral copies this assay can detect is 250  copies / mL. A negative result does not preclude SARS-CoV-2 infection  and should not be used as the sole basis for treatment or other  patient management decisions.  A negative result may occur with  improper specimen collection / handling, submission of specimen other  than nasopharyngeal swab, presence of viral mutation(s) within the  areas targeted by this assay, and inadequate number of viral copies  (<250 copies / mL). A negative result must be combined with clinical  observations, patient history, and epidemiological information. If result is POSITIVE SARS-CoV-2 target nucleic acids are DETECTED. The SARS-CoV-2 RNA is generally detectable in upper and lower  respiratory specimens dur ing the acute phase of infection.  Positive  results are indicative of active infection with SARS-CoV-2.  Clinical  correlation with patient history and other diagnostic information is  necessary to determine patient infection status.  Positive results do  not rule out bacterial infection or co-infection with other viruses. If result is PRESUMPTIVE POSTIVE SARS-CoV-2 nucleic acids MAY BE PRESENT.   A presumptive positive result was obtained on the submitted specimen  and confirmed on repeat testing.  While 2019 novel coronavirus  (SARS-CoV-2) nucleic acids may be present in the submitted sample  additional confirmatory testing may be necessary for epidemiological  and / or clinical management purposes  to differentiate between  SARS-CoV-2 and other Sarbecovirus currently known to infect humans.  If clinically indicated additional testing with an alternate test  methodology [phone]) is advised. The  SARS-CoV-2 RNA is generally  detectable in upper and lower respiratory sp ecimens during the acute  phase of infection. The expected result is Negative. Fact Sheet for Patients:  StrictlyIdeas.no Fact Sheet for Healthcare Providers: BankingDealers.co.za This test is not yet approved or cleared by the [place] FDA and has been authorized for detection and/or diagnosis of SARS-CoV-2 by FDA under an  Emergency Use Authorization (EUA).  This EUA will remain in effect (meaning this test can be used) for the duration of the COVID-19 declaration under Section 564(b)(1) of the Act, 21 U.S.C. section 360bbb-3(b)(1), unless the authorization is terminated or revoked sooner. Performed at [facility], [address] [place], [place] [address]   Urine culture     Status: None   Collection Time: 07/27/18  5:01 PM   Specimen: Urine, Catheterized  Result Value Ref Range Status   Specimen Description   Final    URINE, CATHETERIZED Performed at [facility], [address] [place], [place] [address]    Special Requests   Final    NONE Performed at [facility], [address] [place], [place] [address]    Culture   Final    NO GROWTH Performed at [facility], [address] [place], [place] [address]    Report Status 07/28/2018 FINAL  Final  MRSA PCR Screening     Status: None   Collection Time: 07/27/18  5:39 PM   Specimen: Nasal Mucosa; Nasopharyngeal  Result Value Ref Range Status   MRSA by PCR NEGATIVE NEGATIVE Final    Comment:        The GeneXpert MRSA Assay (FDA approved for NASAL specimens only), is one component of a comprehensive MRSA colonization surveillance program. It is not intended to diagnose MRSA infection nor to guide or monitor treatment for MRSA infections. Performed at [facility], [address] [place], [place] [address]       Studies: No results found.  Scheduled Meds: . sodium chloride   Intravenous Once  . Chlorhexidine Gluconate Cloth  6 each Topical Q0600  . feeding supplement  1 Container Oral BID BM  . feeding supplement (ENSURE ENLIVE)  237 mL Oral BID BM  . free water  300 mL Oral Q8H  . heparin  5,000 Units Subcutaneous Q8H  . lidocaine  15 mL Mouth/Throat TID AC & HS  . multivitamin with minerals  1 tablet Oral Daily  . nystatin  5 mL Oral QID  . potassium chloride  40 mEq Oral Once  . sodium chloride flush  3 mL Intravenous Once    Continuous Infusions: . ceFEPime (MAXIPIME) IV Stopped (08/01/18 [id number])  . fluconazole (DIFLUCAN) IV Stopped (07/31/18 1100)  . magnesium sulfate bolus IVPB       Time spent: 35mins I have personally reviewed and interpreted on  08/01/2018 daily labs, tele strips, imagings as discussed above under date review session and assessment and plans.  I reviewed all nursing notes, pharmacy notes, consultant notes,  vitals, pertinent old records  I have discussed plan of care as described above with RN , patient on 08/01/2018   Albertine GratesFang Marybella Ethier MD, PhD  [facility] Pager [phone]. If 7PM-7AM, please contact night-coverage at [email or web address], password [email or web address] 08/01/2018, 8:10 AM  LOS: 5 days

## 2018-08-02 ENCOUNTER — Ambulatory Visit: Payer: Self-pay | Admitting: Neurology

## 2018-08-02 ENCOUNTER — Inpatient Hospital Stay (HOSPITAL_COMMUNITY): Payer: 59

## 2018-08-02 ENCOUNTER — Encounter

## 2018-08-02 DIAGNOSIS — R918 Other nonspecific abnormal finding of lung field: Secondary | ICD-10-CM

## 2018-08-02 DIAGNOSIS — R748 Abnormal levels of other serum enzymes: Secondary | ICD-10-CM

## 2018-08-02 DIAGNOSIS — R760 Raised antibody titer: Secondary | ICD-10-CM

## 2018-08-02 DIAGNOSIS — L8990 Pressure ulcer of unspecified site, unspecified stage: Secondary | ICD-10-CM | POA: Diagnosis present

## 2018-08-02 DIAGNOSIS — R05 Cough: Secondary | ICD-10-CM

## 2018-08-02 DIAGNOSIS — R945 Abnormal results of liver function studies: Secondary | ICD-10-CM

## 2018-08-02 DIAGNOSIS — B370 Candidal stomatitis: Secondary | ICD-10-CM

## 2018-08-02 DIAGNOSIS — R000 Tachycardia, unspecified: Secondary | ICD-10-CM

## 2018-08-02 DIAGNOSIS — R509 Fever, unspecified: Secondary | ICD-10-CM

## 2018-08-02 LAB — COMPREHENSIVE METABOLIC PANEL
ALT: 33 U/L (ref 0–44)
AST: 85 U/L — ABNORMAL HIGH (ref 15–41)
Albumin: 1.8 g/dL — ABNORMAL LOW (ref 3.5–5.0)
Alkaline Phosphatase: 74 U/L (ref 38–126)
Anion gap: 7 (ref 5–15)
BUN: 8 mg/dL (ref 6–20)
CO2: 17 mmol/L — ABNORMAL LOW (ref 22–32)
Calcium: 7.1 mg/dL — ABNORMAL LOW (ref 8.9–10.3)
Chloride: 118 mmol/L — ABNORMAL HIGH (ref 98–111)
Creatinine, Ser: 0.57 mg/dL (ref 0.44–1.00)
GFR calc Af Amer: >60 mL/min (ref >60)
GFR calc non Af Amer: >60 mL/min (ref >60)
Glucose, Bld: 83 mg/dL (ref 70–99)
Potassium: 2.9 mmol/L — ABNORMAL LOW (ref 3.5–5.1)
Sodium: 142 mmol/L (ref 135–145)
Total Bilirubin: 0.3 mg/dL (ref 0.3–1.2)
Total Protein: 5.6 g/dL — ABNORMAL LOW (ref 6.5–8.1)

## 2018-08-02 LAB — CBC WITH DIFFERENTIAL/PLATELET
Abs Immature Granulocytes: 0.19 10*3/uL — ABNORMAL HIGH (ref 0.00–0.07)
Basophils Absolute: 0 10*3/uL (ref 0.0–0.1)
Basophils Relative: 0 %
Eosinophils Absolute: 0 10*3/uL (ref 0.0–0.5)
Eosinophils Relative: 0 %
HCT: 25.2 % — ABNORMAL LOW (ref 36.0–46.0)
Hemoglobin: 8.1 g/dL — ABNORMAL LOW (ref 12.0–15.0)
Immature Granulocytes: 6 %
Lymphocytes Relative: 16 %
Lymphs Abs: 0.5 10*3/uL — ABNORMAL LOW (ref 0.7–4.0)
MCH: 27.7 pg (ref 26.0–34.0)
MCHC: 32.1 g/dL (ref 30.0–36.0)
MCV: 86.3 fL (ref 80.0–100.0)
Monocytes Absolute: 0.1 10*3/uL (ref 0.1–1.0)
Monocytes Relative: 5 %
Neutro Abs: 2.2 10*3/uL (ref 1.7–7.7)
Neutrophils Relative %: 73 %
Platelets: 151 10*3/uL (ref 150–400)
RBC: 2.92 MIL/uL — ABNORMAL LOW (ref 3.87–5.11)
RDW: 16.9 % — ABNORMAL HIGH (ref 11.5–15.5)
WBC: 3 10*3/uL — ABNORMAL LOW (ref 4.0–10.5)
nRBC: 0 % (ref 0.0–0.2)

## 2018-08-02 LAB — TYPE AND SCREEN
ABO/RH(D): O POS
Antibody Screen: NEGATIVE
Unit division: 0

## 2018-08-02 LAB — BPAM RBC
Blood Product Expiration Date: 202007242359
ISSUE DATE / TIME: 202006281419
Unit Type and Rh: 5100

## 2018-08-02 LAB — MAGNESIUM: Magnesium: 1.8 mg/dL (ref 1.7–2.4)

## 2018-08-02 LAB — ECHOCARDIOGRAM COMPLETE
Height: 61 in
Weight: 3139.35 oz

## 2018-08-02 MED ORDER — LORAZEPAM 2 MG/ML IJ SOLN
1.0000 mg | Freq: Once | INTRAMUSCULAR | Status: AC
  Administered 2018-08-02: 1 mg via INTRAVENOUS

## 2018-08-02 MED ORDER — POTASSIUM CHLORIDE 10 MEQ/100ML IV SOLN
10.0000 meq | INTRAVENOUS | Status: AC
  Administered 2018-08-02 (×6): 10 meq via INTRAVENOUS
  Filled 2018-08-02 (×6): qty 100

## 2018-08-02 MED ORDER — MAGNESIUM SULFATE IN D5W 1-5 GM/100ML-% IV SOLN
1.0000 g | Freq: Once | INTRAVENOUS | Status: AC
  Administered 2018-08-02: 1 g via INTRAVENOUS
  Filled 2018-08-02: qty 100

## 2018-08-02 MED ORDER — LIP MEDEX EX OINT
1.0000 "application " | TOPICAL_OINTMENT | CUTANEOUS | Status: DC | PRN
  Filled 2018-08-02 (×2): qty 7

## 2018-08-02 MED ORDER — FLUCONAZOLE 100 MG PO TABS
200.0000 mg | ORAL_TABLET | Freq: Every day | ORAL | Status: DC
  Administered 2018-08-03 – 2018-08-05 (×3): 200 mg via ORAL
  Filled 2018-08-02 (×3): qty 2

## 2018-08-02 MED ORDER — LORAZEPAM 2 MG/ML IJ SOLN
INTRAMUSCULAR | Status: AC
  Administered 2018-08-02: 1 mg via INTRAVENOUS
  Filled 2018-08-02: qty 1

## 2018-08-02 NOTE — Progress Notes (Signed)
PROGRESS NOTE  Emily Hicks ZOX:[id number] DOB: 15-Sep-1985 DOA: 07/27/2018 PCP: Leland Her, DO  Brief history:   H/o MS off aubagio a week ago due to intolerance, presents with Confusion, weakness, loss of appetite, fever, sepsis MRI brain unremarkable  Found to have severe oropharyngeal thrush with odynophagia initially , treated with diflucan/cefepime Thrush has much improved, odynophagia has resolved, but still spike fever ID consulted MRI  lumber spine pending Hopefully able to go to CIR early next week if fever free and mri lumbar spine is unremarkable   HPI/Recap of past 24 hours: T-max of 102.1 no tach, weight tachycardia of 112 bpm.  Blood pressure of 135/97 mmHg.  Potassium of 2.9 noted, will replete.  Mag is 1.8.  AST is 85 with ALT of 33.  Hemoglobin is 8.1 with hematocrit of 25.2.  MRI of the lumbar spine is negative.  Patient was seen alongside patient's nurse.  Oral thrush seems to have improved significantly.  No significant odynophagia.  No other constitutional symptoms reported.  Input from the infectious disease and GI team is highly appreciated.   Assessment/Plan: Principal Problem:   Severe sepsis (HCC) Active Problems:   MS (multiple sclerosis) (HCC)   Tachycardia   Leukopenia   AKI (acute kidney injury) (HCC)   Hypernatremia   Altered mental status   Fever   AMS (altered mental status)   Acute metabolic encephalopathy   Hypokalemia   Candida esophagitis (HCC)   Hypomagnesemia   Normocytic anemia   Pressure injury of skin   Sepsis /acute metabolic encephalopathy likely due to oropharyngeal candidiasis + esophageal candida -mrsa screening negative, SARS cov2 screening negative, Blood culture negative, urine culture negative, cxr no acute infiltrate, she denies headache, no nuchal rigidity, mri brain no acute findings - she appears is improving on  iv diflucan, topical nystatin and iv cefepime --CMV Igm elevated at 240, case discussed with ID Dr  Orvan Falconer who recommend continue current treatment course since patient appear is improving, per Dr Wess Botts elevated Igm could be nonspecific  -hsv prc negative, EBV IgG+ but IgM- -hiv/RPR negative, -GI and infectious disease input appreciated -she reports feeling better, oral thrush has much improved, odynophagia resolved -however continue to spike fever, will formally consult ID, case discussed with Dr Ninetta Lights on 6/28 08/02/2018: Patient seen by infectious disease team.  De-escalation of antibiotics advised.  Infectious disease team is directing care.  Hypernatremia: Likely due to dehydration,  Resolved after LR and d5 Now off hydration, encourage oral fluids intake 08/02/2018: Hypernatremia has resolved.  Sodium is 142 today.  Continue to monitor closely.   Hypokalemia: Potassium is 2.9 today.  Mag of 1.8. We will give IV KCl 60 M EQ over 6 hours. We will also give IV magnesium 1 g. Renal function and magnesium level in the morning.  Sinus tachycardia: Check TSH with free T4. Continue to treat underlying process.  AKI Likely prerenal  AKI has resolved.   Abnormal LFTs:  Possible side effect from [place]? Continue to monitor closely.   GI following  Normocytic Anemia: Likely anemia of chronic inflammation. Continue to monitor closely.  H/O MS -stopped Aubagio a week ago due to cytopenia and abnormal liver function -Mri brain no acute findings -Per neurology Dr Laurence Slate, generalized weakness likely pseudo exacerbation due to acute illness ( sepsis)  Generalized weakness MRI of the lumbar spine is nonrevealing. PT eval/CIR placement  Code Status: full  Family Communication: patient   Disposition Plan: remain in stepdown Inpatient rehab, and  this will depend on hospital course.    Consultants:  GI  ID  CIR  Infectious disease  Procedures:  Midline placement   Antibiotics:  vanc x1 on presentation (discontinued).  Cefepime discontinued.  IV  Diflucan  Oral nystatin   Objective: BP (!) 135/97    Pulse (!) 112    Temp 99.2 F (37.3 C) (Oral)    Resp (!) 26    Ht 5\' 1"  (1.549 m)    Wt 89 kg    SpO2 98%    BMI 37.07 kg/m   Intake/Output Summary (Last 24 hours) at 08/02/2018 1001 Last data filed at 08/02/2018 0817 Gross per 24 hour  Intake 249.14 ml  Output 700 ml  Net -450.86 ml   Filed Weights   07/27/18 1745 07/31/18 0300  Weight: 83.3 kg 89 kg    Exam:  General: Not in any distress.  Patient is awake and alert.  Cardiovascular: sinus tachycardia   Respiratory: CTABL  Abdomen: Soft/ND/NT, positive BS  Musculoskeletal: No Edema Neuro: Awake and alert.  Moves all extremities, but seems weak.    Data Reviewed: Basic Metabolic Panel: Recent Labs  Lab 07/29/18 0219  07/30/18 0605 07/30/18 1320 07/31/18 0221 08/01/18 0255 08/02/18 0500  NA 156*   < > 149* 146* 144 143 142  K 2.9*   < > 2.8* 3.4* 3.4* 3.5 2.9*  CL 130*   < > 126* 122* 121* 120* 118*  CO2 20*   < > 17* 18* 17* 17* 17*  GLUCOSE 108*   < > 122* 103* 99 90 83  BUN 18   < > 13 10 10 10 8   CREATININE 0.90   < > 0.69 0.69 0.69 0.62 0.57  CALCIUM 7.3*   < > 7.3* 7.1* 7.2* 7.1* 7.1*  MG 1.8  --   --   --  1.7 1.7 1.8   < > = values in this interval not displayed.   Liver Function Tests: Recent Labs  Lab 07/29/18 0219 07/30/18 1320 07/31/18 0221 08/01/18 0255 08/02/18 0500  AST 117* 83* 86* 98* 85*  ALT 41 31 33 36 33  ALKPHOS 46 47 50 63 74  BILITOT 0.6 0.4 0.2* 0.3 0.3  PROT 5.6* 5.6* 5.5* 5.4* 5.6*  ALBUMIN 1.9* 1.8* 1.7* 1.7* 1.8*   No results for input(s): LIPASE, AMYLASE in the last 168 hours. No results for input(s): AMMONIA in the last 168 hours. CBC: Recent Labs  Lab 07/29/18 0219 07/30/18 0605 07/31/18 0221 08/01/18 0255 08/01/18 1021 08/01/18 1930 08/02/18 0500  WBC 4.1 2.7* 2.8* 2.9*  --   --  3.0*  NEUTROABS 3.5 2.2 2.0 2.1  --   --  2.2  HGB 7.1* 7.2* 7.2* 6.5* 6.8* 8.5* 8.1*  HCT 23.8* 23.0* 22.8* 20.5*  22.2* 26.2* 25.2*  MCV 89.5 86.8 83.5 84.4  --   --  86.3  PLT 192 172 151 145*  --   --  151   Cardiac Enzymes:   No results for input(s): CKTOTAL, CKMB, CKMBINDEX, TROPONINI in the last 168 hours. BNP (last 3 results) No results for input(s): BNP in the last 8760 hours.  ProBNP (last 3 results) No results for input(s): PROBNP in the last 8760 hours.  CBG: Recent Labs  Lab 07/31/18 1154 07/31/18 1630  GLUCAP 94 85    Recent Results (from the past 240 hour(s))  Blood Culture (routine x 2)     Status: None   Collection Time: 07/27/18  2:04 PM  Specimen: BLOOD  Result Value Ref Range Status   Specimen Description   Final    BLOOD LEFT ANTECUBITAL Performed at [facility], [address] [place], [place] [address]    Special Requests   Final    BOTTLES DRAWN AEROBIC AND ANAEROBIC Blood Culture adequate volume Performed at [facility], [address] [place], [place] [address]    Culture   Final    NO GROWTH 5 DAYS Performed at [facility], [address] [place], [place] [address]    Report Status 08/01/2018 FINAL  Final  Blood Culture (routine x 2)     Status: None   Collection Time: 07/27/18  2:09 PM   Specimen: BLOOD  Result Value Ref Range Status   Specimen Description   Final    BLOOD RIGHT ANTECUBITAL Performed at [facility], [address] [place], [place] [address]    Special Requests   Final    BOTTLES DRAWN AEROBIC AND ANAEROBIC Blood Culture adequate volume Performed at [facility], [address] [place], [place] [address]    Culture   Final    NO GROWTH 5 DAYS Performed at [facility], [address] [place], [place] [address]    Report Status 08/01/2018 FINAL  Final  SARS Coronavirus 2 (CEPHEID - Performed in [facility] hospital lab), Hosp Order     Status: None   Collection Time: 07/27/18  2:34 PM   Specimen: Nasopharyngeal Swab  Result Value Ref  Range Status   SARS Coronavirus 2 NEGATIVE NEGATIVE Final    Comment: (NOTE) If result is NEGATIVE SARS-CoV-2 target nucleic acids are NOT DETECTED. The SARS-CoV-2 RNA is generally detectable in upper and lower  respiratory specimens during the acute phase of infection. The lowest  concentration of SARS-CoV-2 viral copies this assay can detect is 250  copies / mL. A negative result does not preclude SARS-CoV-2 infection  and should not be used as the sole basis for treatment or other  patient management decisions.  A negative result may occur with  improper specimen collection / handling, submission of specimen other  than nasopharyngeal swab, presence of viral mutation(s) within the  areas targeted by this assay, and inadequate number of viral copies  (<250 copies / mL). A negative result must be combined with clinical  observations, patient history, and epidemiological information. If result is POSITIVE SARS-CoV-2 target nucleic acids are DETECTED. The SARS-CoV-2 RNA is generally detectable in upper and lower  respiratory specimens dur ing the acute phase of infection.  Positive  results are indicative of active infection with SARS-CoV-2.  Clinical  correlation with patient history and other diagnostic information is  necessary to determine patient infection status.  Positive results do  not rule out bacterial infection or co-infection with other viruses. If result is PRESUMPTIVE POSTIVE SARS-CoV-2 nucleic acids MAY BE PRESENT.   A presumptive positive result was obtained on the submitted specimen  and confirmed on repeat testing.  While 2019 novel coronavirus  (SARS-CoV-2) nucleic acids may be present in the submitted sample  additional confirmatory testing may be necessary for epidemiological  and / or clinical management purposes  to differentiate between  SARS-CoV-2 and other Sarbecovirus currently known to infect humans.  If clinically indicated additional testing with an  alternate test  methodology [phone]([id number]) is advised. The SARS-CoV-2 RNA is generally  detectable in upper and lower respiratory sp ecimens during the acute  phase of  infection. The expected result is Negative. Fact Sheet for Patients:  BoilerBrush.com.cy Fact Sheet for Healthcare Providers: https://pope.com/ This test is not yet approved or cleared by the [place] FDA and has been authorized for detection and/or diagnosis of SARS-CoV-2 by FDA under an Emergency Use Authorization (EUA).  This EUA will remain in effect (meaning this test can be used) for the duration of the COVID-19 declaration under Section 564(b)(1) of the Act, 21 U.S.C. section 360bbb-3(b)(1), unless the authorization is terminated or revoked sooner. Performed at [facility], [address], [place], [place] [address]   Urine culture     Status: None   Collection Time: 07/27/18  5:01 PM   Specimen: Urine, Catheterized  Result Value Ref Range Status   Specimen Description   Final    URINE, CATHETERIZED Performed at [facility], [address], [place], [place] [address]    Special Requests   Final    NONE Performed at [facility], [address] [place], [place] [address]    Culture   Final    NO GROWTH Performed at [facility], [address], [place], [place] [address]    Report Status 07/28/2018 FINAL  Final  MRSA PCR Screening     Status: None   Collection Time: 07/27/18  5:39 PM   Specimen: Nasal Mucosa; Nasopharyngeal  Result Value Ref Range Status   MRSA by PCR NEGATIVE NEGATIVE Final    Comment:        The GeneXpert MRSA Assay (FDA approved for NASAL specimens only), is one component of a comprehensive MRSA colonization surveillance program. It is not intended to diagnose MRSA infection nor to guide or monitor treatment for MRSA infections. Performed at [facility], [address], [place], [place] [address]      Studies: Ct Chest W Contrast  Result Date: 08/02/2018 CLINICAL DATA:  Fever of unknown origin. EXAM: CT CHEST, ABDOMEN, AND PELVIS WITH CONTRAST TECHNIQUE: Multidetector CT imaging of the chest, abdomen and pelvis was performed following the standard protocol during bolus administration of intravenous contrast. CONTRAST:  OMNIPAQUE IOHEXOL 300 MG/ML SOLN, 30mL OMNIPAQUE IOHEXOL 300 MG/ML SOLN COMPARISON:  None. FINDINGS: CT CHEST FINDINGS Cardiovascular: There is no large centrally located pulmonary embolus. Detection of smaller segmental and subsegmental pulmonary emboli is limited by contrast bolus timing. The main pulmonary artery is within normal limits for size. There is no CT evidence of acute right heart strain. The visualized aorta is normal. Heart size is normal. There is a trace pericardial effusion. Mediastinum/Nodes: --there are mildly prominent mediastinal and hilar lymph nodes bilaterally. --there is mild bilateral axillary adenopathy. --No supraclavicular lymphadenopathy. --Normal thyroid gland. --The esophagus is unremarkable Lungs/Pleura: There are trace bilateral pleural effusions with adjacent atelectasis. There is scattered ground-glass airspace opacities bilaterally, greatest within the right upper lobe. There is no pneumothorax. The trachea is unremarkable. Musculoskeletal: No chest wall abnormality. No acute or significant osseous findings. 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 CT ABDOMEN PELVIS FINDINGS Hepatobiliary: The liver is borderline enlarged. Status post cholecystectomy.There is no biliary ductal dilation. Pancreas: There is some mild peripancreatic fat stranding. Spleen: There is a small wedge-shaped defect in the lower pole the spleen measuring approximately 1.8 cm (axial series 2 image 47). Adrenals/Urinary Tract: --Adrenal glands: No adrenal hemorrhage. --Right kidney/ureter:  No hydronephrosis or perinephric hematoma. --Left kidney/ureter: No hydronephrosis or perinephric hematoma. --Urinary bladder: Unremarkable. Stomach/Bowel: --Stomach/Duodenum: No hiatal  hernia or other gastric abnormality. Normal duodenal course and caliber. --Small bowel: No dilatation or inflammation. --Colon: No focal abnormality. --Appendix: Normal. Vascular/Lymphatic: Normal course and caliber of the major abdominal vessels. There is a retroaortic left renal vein, a normal variant. --there are prominent retroperitoneal lymph nodes. --there are prominent mesenteric lymph nodes. There is haziness throughout the mesentery. There are enlarged right lower quadrant lymph nodes measuring approximately 1.1 cm in diameter. --there are mildly enlarged pelvic and inguinal lymph nodes. Reproductive: There is a small amount of free fluid in the pelvis which may be physiologic. There is no discrete ovarian mass. Other: There is a small volume of pelvic free fluid which is likely physiologic. No free air. There is some mild fat stranding involving the anterior abdominal wall bilaterally. Musculoskeletal. No acute displaced fractures. IMPRESSION: 1. Scattered ground-glass airspace opacities, greatest within the right upper lobe, concerning for multifocal pneumonia. 2. Small bilateral pleural effusions with adjacent atelectasis. 3. Mild diffuse adenopathy throughout the chest, abdomen, and pelvis of unknown clinical significance. 4. Peripancreatic free fluid with edema within the root of the mesentery. Findings are nonspecific and can be seen in patients with pancreatitis, versus less likely sclerosing mesenteritis or inflammatory bowel disease. There are some prominent lymph nodes right lower quadrant which can be seen in patients with mesenteric adenitis. 5. Normal appendix in the right lower quadrant. 6. Small volume free fluid in the pelvis favored to be physiologic. 7. Wedge-shaped hypoattenuating defect in the spleen of  unknown clinical significance. Differential considerations include a splenic infarct versus less likely a splenic laceration, especially in the absence of a history of trauma. There is no perisplenic hematoma. Electronically Signed   By: Katherine Mantle M.D.   On: 08/02/2018 00:18   Mr Lumbar Spine Wo Contrast  Result Date: 08/02/2018 CLINICAL DATA:  Fever. Lower extremity weakness. History of multiple sclerosis. EXAM: MRI LUMBAR SPINE WITHOUT CONTRAST TECHNIQUE: Multiplanar, multisequence MR imaging of the lumbar spine was performed. No intravenous contrast was administered. COMPARISON:  CT abdomen pelvis from yesterday. FINDINGS: Despite efforts by the technologist and patient, motion artifact is present on today's exam and could not be eliminated. This reduces exam sensitivity and specificity. Segmentation:  Standard. Alignment:  Physiologic. Vertebrae:  No fracture, evidence of discitis, or bone lesion. Conus medullaris and cauda equina: Conus extends to the L1-L2 level. Conus and cauda equina appear normal. Paraspinal and other soft tissues: Symmetric bilateral paraspinous muscle edema. Otherwise negative. Disc levels: No significant disc bulge or herniation. No spinal canal or neuroforaminal stenosis. IMPRESSION: 1.  No acute abnormality or significant degenerative changes. Electronically Signed   By: Obie Dredge M.D.   On: 08/02/2018 09:14   Ct Abdomen Pelvis W Contrast  Result Date: 08/02/2018 CLINICAL DATA:  Fever of unknown origin. EXAM: CT CHEST, ABDOMEN, AND PELVIS WITH CONTRAST TECHNIQUE: Multidetector CT imaging of the chest, abdomen and pelvis was performed following the standard protocol during bolus administration of intravenous contrast. CONTRAST:  OMNIPAQUE IOHEXOL 300 MG/ML SOLN, 60mL OMNIPAQUE IOHEXOL 300 MG/ML SOLN COMPARISON:  None. FINDINGS: CT CHEST FINDINGS Cardiovascular: There is no large centrally located pulmonary embolus. Detection of smaller segmental and  subsegmental pulmonary emboli is limited by contrast bolus timing. The main pulmonary artery is within normal limits for size. There is no CT evidence of acute right heart strain. The visualized aorta is normal. Heart size is normal. There is a trace pericardial effusion. Mediastinum/Nodes: --there are mildly prominent mediastinal and hilar lymph nodes bilaterally. --there is  mild bilateral axillary adenopathy. --No supraclavicular lymphadenopathy. --Normal thyroid gland. --The esophagus is unremarkable Lungs/Pleura: There are trace bilateral pleural effusions with adjacent atelectasis. There is scattered ground-glass airspace opacities bilaterally, greatest within the right upper lobe. There is no pneumothorax. The trachea is unremarkable. Musculoskeletal: No chest wall abnormality. No acute or significant osseous findings. 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 CT ABDOMEN PELVIS FINDINGS Hepatobiliary: The liver is borderline enlarged. Status post cholecystectomy.There is no biliary ductal dilation. Pancreas: There is some mild peripancreatic fat stranding. Spleen: There is a small wedge-shaped defect in the lower pole the spleen measuring approximately 1.8 cm (axial series 2 image 47). Adrenals/Urinary Tract: --Adrenal glands: No adrenal hemorrhage. --Right kidney/ureter: No hydronephrosis or perinephric hematoma. --Left kidney/ureter: No hydronephrosis or perinephric hematoma. --Urinary bladder: Unremarkable. Stomach/Bowel: --Stomach/Duodenum: No hiatal hernia or other gastric abnormality. Normal duodenal course and caliber. --Small bowel: No dilatation or inflammation. --Colon: No focal abnormality. --Appendix: Normal. Vascular/Lymphatic: Normal course and caliber of the major abdominal vessels. There is a retroaortic left renal vein, a normal variant. --there are prominent retroperitoneal lymph nodes. --there are prominent mesenteric lymph nodes. There is haziness throughout the  mesentery. There are enlarged right lower quadrant lymph nodes measuring approximately 1.1 cm in diameter. --there are mildly enlarged pelvic and inguinal lymph nodes. Reproductive: There is a small amount of free fluid in the pelvis which may be physiologic. There is no discrete ovarian mass. Other: There is a small volume of pelvic free fluid which is likely physiologic. No free air. There is some mild fat stranding involving the anterior abdominal wall bilaterally. Musculoskeletal. No acute displaced fractures. IMPRESSION: 1. Scattered ground-glass airspace opacities, greatest within the right upper lobe, concerning for multifocal pneumonia. 2. Small bilateral pleural effusions with adjacent atelectasis. 3. Mild diffuse adenopathy throughout the chest, abdomen, and pelvis of unknown clinical significance. 4. Peripancreatic free fluid with edema within the root of the mesentery. Findings are nonspecific and can be seen in patients with pancreatitis, versus less likely sclerosing mesenteritis or inflammatory bowel disease. There are some prominent lymph nodes right lower quadrant which can be seen in patients with mesenteric adenitis. 5. Normal appendix in the right lower quadrant. 6. Small volume free fluid in the pelvis favored to be physiologic. 7. Wedge-shaped hypoattenuating defect in the spleen of unknown clinical significance. Differential considerations include a splenic infarct versus less likely a splenic laceration, especially in the absence of a history of trauma. There is no perisplenic hematoma. Electronically Signed   By: Christopher  Diviney M.D.   On: 08/02/2018 00:18    Scheduled Meds:  sodium chloride   Intravenous Once   Chlorhexidine Gluconate Cloth  6 each Topical Daily   feeding supplement  1 Container Oral BID BM   feeding supplement (ENSURE ENLIVE)  237 mL Oral BID BM   folic acid  1 mg Oral Daily   free water  300 mL Oral Q8H   heparin  5TomalesUnits Subcutaneous Q8H    lidocaine  15 mL Mouth/Throat T [id number] discussed above under date review session  and assessment and plans.  I reviewed all nursing notes, pharmacy notes, consultant notes,  vitals, pertinent old records  I have discussed plan of care as described above with RN , patient on 08/02/2018   Barnetta ChapelSylvester I Evadna Donaghy MD. [facility] 7PM-7AM, please contact night-coverage at [email or web address], password [facility]1 08/02/2018, 10:01 AM  LOS: 6 days

## 2018-08-02 NOTE — Progress Notes (Signed)
PHARMACIST - PHYSICIAN COMMUNICATION CONCERNING: Antibiotic IV to Oral Route Change Policy  RECOMMENDATION: This patient is receiving fluconazole by the intravenous route.  Based on criteria approved by the Pharmacy and Therapeutics Committee, the antibiotic(s) is/are being converted to the equivalent oral dose form(s).   DESCRIPTION: These criteria include:  Patient being treated for a respiratory tract infection, urinary tract infection, cellulitis or clostridium difficile associated diarrhea if on metronidazole  The patient is not neutropenic and does not exhibit a GI malabsorption state  The patient is eating (either orally or via tube) and/or has been taking other orally administered medications for a least 24 hours  The patient is improving clinically and has a Tmax < 100.5  If you have questions about this conversion, please contact the Pharmacy Department  []   [phone] )  [facility] []   [phone] )  [facility] []   [phone] )  [facility] []   [phone] )  [facility] [x]   [phone] )  [place], [place].D 08/02/2018 3:00 PM

## 2018-08-02 NOTE — Progress Notes (Signed)
Physical Therapy Treatment Patient Details Name: Emily Hicks MRN: [id number] DOB: 17-Nov-1985 Today's Date: 08/02/2018    History of Present Illness This 33 year old female was admitted for progressive weakness and AMS.  She was recently dxd with MS    PT Comments    Pt progressing slowly, able to transfer to and from [facility] and take a few steps with +2 assist; pt with somewhat of an inconsistent presentation, able to move all 4 extremities spontaneously at times however intermittently unable on command. RN reports similar inconsistencies with physical presentation (able to amb to bathroom then unable to stand, able to move UEs then unable to feed self). (question if some component of conversion d/o potentially vs cog deficits related to MS/sepsis/AMS??). Will continue to follow in acute setting.   Follow Up Recommendations  CIR     Equipment Recommendations  Other (comment)(defer )    Recommendations for Other Services       Precautions / Restrictions Precautions Precautions: Fall Precaution Comments: monitor vitals, tachy/elevated HR Restrictions Weight Bearing Restrictions: No    Mobility  Bed Mobility Overal bed mobility: Needs Assistance Bed Mobility: Rolling;Sidelying to Sit;Supine to Sit;Sit to Supine Rolling: Mod assist Sidelying to sit: +2 for physical assistance;Mod assist;+2 for safety/equipment Supine to sit: +2 for safety/equipment;+2 for physical assistance;Mod assist     General bed mobility comments: requiring multi-modal cues and hand over hand to participate and self assist  Transfers Overall transfer level: Needs assistance Equipment used: Rolling walker (2 wheeled) Transfers: Sit to/from Stand;Stand Pivot Transfers Sit to Stand: +2 physical assistance;+2 safety/equipment;Mod assist;Min assist Stand pivot transfers: Min assist;+2 physical assistance;+2 safety/equipment       General transfer comment: initially stood with bil knee support and lifting  assist from PT and tech, pt then given RW and  able to pivot to [facility] with min assist mostly for safety, steadying assist   Ambulation/Gait Ambulation/Gait assistance: Min assist;+2 physical assistance;+2 safety/equipment Gait Distance (Feet): 5 Feet Assistive device: Rolling walker (2 wheeled) Gait Pattern/deviations: Step-to pattern;Wide base of support     General Gait Details: pivotal steps/fwd/back and then laterally along EOB; HR 110-150 max, BP 124/90 with mild dizziness during mobility   Stairs             Wheelchair Mobility    Modified Rankin (Stroke Patients Only)       Balance   Sitting-balance support: Feet supported Sitting balance-Leahy Scale: Fair       Standing balance-Leahy Scale: Poor                              Cognition Arousal/Alertness: Awake/alert Behavior During Therapy: Flat affect Overall Cognitive Status: No family/caregiver present to determine baseline cognitive functioning Area of Impairment: Attention;Memory;Following commands;Safety/judgement;Problem solving                   Current Attention Level: Sustained   Following Commands: Follows one step commands inconsistently Safety/Judgement: Decreased awareness of safety;Decreased awareness of deficits   Problem Solving: Slow processing;Decreased initiation;Difficulty sequencing;Requires verbal cues;Requires tactile cues General Comments: pt with inconsistent presentation,  moves extremities spontaneously at times then unable to move  when cued      Exercises      General Comments        Pertinent Vitals/Pain Pain Assessment: No/denies pain(grimaces witth movement)    Home Living  Prior Function            PT Goals (current goals can now be found in the care plan section) Acute Rehab PT Goals Patient Stated Goal: none stated; agreeable to therapy PT Goal Formulation: With patient Time For Goal Achievement:  08/12/18 Potential to Achieve Goals: Good Progress towards PT goals: Progressing toward goals    Frequency    Min 4X/week      PT Plan Current plan remains appropriate    Co-evaluation              AM-PAC PT "6 Clicks" Mobility   Outcome Measure  Help needed turning from your back to your side while in a flat bed without using bedrails?: A Lot Help needed moving from lying on your back to sitting on the side of a flat bed without using bedrails?: A Lot Help needed moving to and from a bed to a chair (including a wheelchair)?: A Lot Help needed standing up from a chair using your arms (e.g., wheelchair or bedside chair)?: A Lot Help needed to walk in hospital room?: Total Help needed climbing 3-5 steps with a railing? : Total 6 Click Score: 10    End of Session Equipment Utilized During Treatment: Gait belt Activity Tolerance: Patient tolerated treatment well;Patient limited by fatigue Patient left: in bed;with call bell/phone within reach;with bed alarm set Nurse Communication: Mobility status PT Visit Diagnosis: Other abnormalities of gait and mobility (R26.89);Difficulty in walking, not elsewhere classified (R26.2)     Time: [id number] PT Time Calculation (min) (ACUTE ONLY): 32 min  Charges:  $Therapeutic Activity: 23-37 mins                     Drucilla Chaletara Reka Wist, PT  Pager: [phone] Acute Rehab Dept [facility]): [phone]   08/02/2018    [facility]ILL[facility],Emily Cannell 08/02/2018, 3:43 PM

## 2018-08-02 NOTE — Progress Notes (Addendum)
Patient ID: Emily Hicks, female   DOB: Jul 01, 1985, 33 y.o.   MRN: [id number]         [facility] for Infectious Disease  Date of Admission:  07/27/2018           Day 6 fluconazole ASSESSMENT: She is improving on therapy for probable candidal esophagitis.  I do not think that EGD is warranted at this time.  The cause of her admission fevers is not entirely clear.  Her CMV IgM antibody was elevated.  Liver enzymes were elevated but I note that they have been elevated for several months.  Blood and urine cultures are negative.  Today's CT scan showed some very faint areas of groundglass opacity but  I doubt that she has pneumonia.  PLAN: 1. Continue fluconazole for now  Principal Problem:   Severe sepsis ([place]) Active Problems:   Fever   AMS (altered mental status)   Candida esophagitis (HCC)   MS (multiple sclerosis) (HCC)   Tachycardia   Leukopenia   AKI (acute kidney injury) ([place])   Hypernatremia   Acute metabolic encephalopathy   Hypokalemia   Hypomagnesemia   Normocytic anemia   Pressure injury of skin   Elevated liver enzymes   Scheduled Meds: . Chlorhexidine Gluconate Cloth  6 each Topical Daily  . feeding supplement  1 Container Oral BID BM  . feeding supplement (ENSURE ENLIVE)  237 mL Oral BID BM  . [START ON 08/03/2018] fluconazole  200 mg Oral Daily  . folic acid  1 mg Oral Daily  . free water  300 mL Oral Q8H  . heparin  5,000 Units Subcutaneous Q8H  . lidocaine  15 mL Mouth/Throat TID AC & HS  . metoprolol tartrate  12.5 mg Oral BID  . nystatin  5 mL Oral QID  . sodium chloride flush  3 mL Intravenous Once   Continuous Infusions: . potassium chloride 10 mEq (08/02/18 1521)   PRN Meds:.acetaminophen, lip balm, LORazepam, metoprolol tartrate, ondansetron (ZOFRAN) IV, sodium chloride flush   SUBJECTIVE: She is feeling a little bit better today.  The pain and difficulty with swallowing is significantly better.  She says that she has had slight cough  recently but only when she swallows water.  She denies any shortness of breath.  Review of Systems: Review of Systems  Constitutional: Positive for fever. Negative for chills and diaphoresis.  HENT:       As noted in HPI.  Respiratory: Positive for cough. Negative for sputum production and shortness of breath.   Cardiovascular: Negative for chest pain.  Gastrointestinal: Negative for abdominal pain, diarrhea, nausea and vomiting.  Genitourinary: Negative for dysuria.    No Known Allergies  OBJECTIVE: Vitals:   08/02/18 1200 08/02/18 1300 08/02/18 1400 08/02/18 1500  BP: 130/85 136/88 (!) 136/92 (!) 124/98  Pulse: (!) 113 (!) 123    Resp: (!) 30   11  Temp:      TempSrc:      SpO2: 100% 100%    Weight:      Height:       Body mass index is 37.07 kg/m.  Physical Exam Constitutional:      Comments: She is resting quietly in bed.  She is in no distress.  She speaks slowly and softly.  HENT:     Mouth/Throat:     Pharynx: No oropharyngeal exudate.  Cardiovascular:     Rate and Rhythm: Regular rhythm.     Heart sounds: No murmur.  Comments: She is tachycardic. Pulmonary:     Effort: Pulmonary effort is normal.     Breath sounds: Normal breath sounds. No wheezing or rales.  Abdominal:     Palpations: Abdomen is soft. There is no mass.     Tenderness: There is no abdominal tenderness.     Lab Results Lab Results  Component Value Date   WBC 3.0 (L) 08/02/2018   HGB 8.1 (L) 08/02/2018   HCT 25.2 (L) 08/02/2018   MCV 86.3 08/02/2018   PLT 151 08/02/2018    Lab Results  Component Value Date   CREATININE 0.57 08/02/2018   BUN 8 08/02/2018   NA 142 08/02/2018   K 2.9 (L) 08/02/2018   CL 118 (H) 08/02/2018   CO2 17 (L) 08/02/2018    Lab Results  Component Value Date   ALT 33 08/02/2018   AST 85 (H) 08/02/2018   ALKPHOS 74 08/02/2018   BILITOT 0.3 08/02/2018     Microbiology: Recent Results (from the past 240 hour(s))  Blood Culture (routine x 2)      Status: None   Collection Time: 07/27/18  2:04 PM   Specimen: BLOOD  Result Value Ref Range Status   Specimen Description   Final    BLOOD LEFT ANTECUBITAL Performed at [facility], [address] [place], [place] [address]    Special Requests   Final    BOTTLES DRAWN AEROBIC AND ANAEROBIC Blood Culture adequate volume Performed at [facility], [address] [place], [place] [address]    Culture   Final    NO GROWTH 5 DAYS Performed at [facility], [address] [place], [place] [address]    Report Status 08/01/2018 FINAL  Final  Blood Culture (routine x 2)     Status: None   Collection Time: 07/27/18  2:09 PM   Specimen: BLOOD  Result Value Ref Range Status   Specimen Description   Final    BLOOD RIGHT ANTECUBITAL Performed at [facility], [address] [place], [place] [address]    Special Requests   Final    BOTTLES DRAWN AEROBIC AND ANAEROBIC Blood Culture adequate volume Performed at [facility], [address] [place], [place] [address]    Culture   Final    NO GROWTH 5 DAYS Performed at [facility], [address] [place], [place] [address]    Report Status 08/01/2018 FINAL  Final  SARS Coronavirus 2 (CEPHEID - Performed in [facility] lab), Hosp Order     Status: None   Collection Time: 07/27/18  2:34 PM   Specimen: Nasopharyngeal Swab  Result Value Ref Range Status   SARS Coronavirus 2 NEGATIVE NEGATIVE Final    Comment: (NOTE) If result is NEGATIVE SARS-CoV-2 target nucleic acids are NOT DETECTED. The SARS-CoV-2 RNA is generally detectable in upper and lower  respiratory specimens during the acute phase of infection. The lowest  concentration of SARS-CoV-2 viral copies this assay can detect is 250  copies / mL. A negative result does not preclude SARS-CoV-2 infection  and should not be used as the sole basis for treatment or other  patient management  decisions.  A negative result may occur with  improper specimen collection / handling, submission of specimen other  than nasopharyngeal swab, presence of viral mutation(s) within the  areas targeted by this assay, and inadequate number of viral copies  (<250 copies / mL). A negative result  must be combined with clinical  observations, patient history, and epidemiological information. If result is POSITIVE SARS-CoV-2 target nucleic acids are DETECTED. The SARS-CoV-2 RNA is generally detectable in upper and lower  respiratory specimens dur ing the acute phase of infection.  Positive  results are indicative of active infection with SARS-CoV-2.  Clinical  correlation with patient history and other diagnostic information is  necessary to determine patient infection status.  Positive results do  not rule out bacterial infection or co-infection with other viruses. If result is PRESUMPTIVE POSTIVE SARS-CoV-2 nucleic acids MAY BE PRESENT.   A presumptive positive result was obtained on the submitted specimen  and confirmed on repeat testing.  While 2019 novel coronavirus  (SARS-CoV-2) nucleic acids may be present in the submitted sample  additional confirmatory testing may be necessary for epidemiological  and / or clinical management purposes  to differentiate between  SARS-CoV-2 and other Sarbecovirus currently known to infect humans.  If clinically indicated additional testing with an alternate test  methodology [phone]([id number]) is advised. The SARS-CoV-2 RNA is generally  detectable in upper and lower respiratory sp ecimens during the acute  phase of infection. The expected result is Negative. Fact Sheet for Patients:  BoilerBrush.com.cyhttps://www.fda.gov/media/136312/download Fact Sheet for Healthcare Providers: https://pope.com/https://www.fda.gov/media/136313/download This test is not yet approved or cleared by the [place] FDA and has been authorized for detection and/or diagnosis of SARS-CoV-2 by FDA under an  Emergency Use Authorization (EUA).  This EUA will remain in effect (meaning this test can be used) for the duration of the COVID-19 declaration under Section 564(b)(1) of the Act, 21 U.S.C. section 360bbb-3(b)(1), unless the authorization is terminated or revoked sooner. Performed at [facility], [address] [place], [place] [address]   Urine culture     Status: None   Collection Time: 07/27/18  5:01 PM   Specimen: Urine, Catheterized  Result Value Ref Range Status   Specimen Description   Final    URINE, CATHETERIZED Performed at [facility], [address] [place], [place] [address]    Special Requests   Final    NONE Performed at [facility], [address] [place], [place] [address]    Culture   Final    NO GROWTH Performed at [facility], [address] [place], [place] [address]    Report Status 07/28/2018 FINAL  Final  MRSA PCR Screening     Status: None   Collection Time: 07/27/18  5:39 PM   Specimen: Nasal Mucosa; Nasopharyngeal  Result Value Ref Range Status   MRSA by PCR NEGATIVE NEGATIVE Final    Comment:        The GeneXpert MRSA Assay (FDA approved for NASAL specimens only), is one component of a comprehensive MRSA colonization surveillance program. It is not intended to diagnose MRSA infection nor to guide or monitor treatment for MRSA infections. Performed at [facility], [address] [place], [place] [address]     Cliffton AstersJohn Taheera Thomann, MD [facility] [phone] pager   [phone] 08/02/2018, 3:31 PM

## 2018-08-02 NOTE — Progress Notes (Signed)
[facility] Progress Note  Chief Complaint: Odynophagia, fever  History:  Signout received from Dr. Lyndel Safe, chart review performed regarding this patient's clinical history. She went down earlier this morning for MRI lumbar spine, and received 1 mg Ativan prior to that.  She is still somnolent from the effect of the medicine, limits her history and review of systems now.  She tells me swallowing has been much improved in the last couple of days since starting fluconazole after discovery of thrush.  Appetite has remained poor because she is feeling unwell. Nursing reports she had a fever yesterday responsive to Tylenol, none overnight.  She has persistent tachycardia feels generally fatigued and unwell.  ROS: She denies chest pain.  She has intermittent cough, does not think she is bringing up any sputum. Remainder of systems attempted but difficult to obtain because patient is somnolent.  Objective:   Current Facility-Administered Medications:  .  0.9 %  sodium chloride infusion (Manually program via Guardrails IV Fluids), , Intravenous, Once, Bodenheimer, Charles A, NP .  acetaminophen (TYLENOL) tablet 650 mg, 650 mg, Oral, Q6H PRN, Hollace Hayward K, NP, 650 mg at 08/01/18 4742 .  Chlorhexidine Gluconate Cloth 2 % PADS 6 each, 6 each, Topical, Daily, Florencia Reasons, MD .  feeding supplement (BOOST / RESOURCE BREEZE) liquid 1 Container, 1 Container, Oral, BID BM, Florencia Reasons, MD, 1 Container at 08/01/18 1406 .  feeding supplement (ENSURE ENLIVE) (ENSURE ENLIVE) liquid 237 mL, 237 mL, Oral, BID BM, Florencia Reasons, MD, 237 mL at 07/30/18 1929 .  fluconazole (DIFLUCAN) IVPB 200 mg, 200 mg, Intravenous, Q24H, Polly Cobia, RPH, Stopped at 08/01/18 1309 .  folic acid (FOLVITE) tablet 1 mg, 1 mg, Oral, Daily, Florencia Reasons, MD, 1 mg at 08/01/18 1405 .  free water 300 mL, 300 mL, Oral, Q8H, Florencia Reasons, MD, 300 mL at 08/01/18 1405 .  heparin injection 5,000 Units, 5,000 Units, Subcutaneous, Q8H, Shelly Coss, MD,  5,000 Units at 08/02/18 0523 .  lidocaine (XYLOCAINE) 2 % viscous mouth solution 15 mL, 15 mL, Mouth/Throat, TID AC & HS, Esterwood, Amy S, PA-C, 15 mL at 08/01/18 2106 .  lip balm (CARMEX) ointment 1 application, 1 application, Topical, PRN, Florencia Reasons, MD .  LORazepam (ATIVAN) tablet 0.5 mg, 0.5 mg, Oral, Q6H PRN, Tawanna Solo, Amrit, MD .  metoprolol tartrate (LOPRESSOR) injection 2.5 mg, 2.5 mg, Intravenous, Q4H PRN, Florencia Reasons, MD .  metoprolol tartrate (LOPRESSOR) tablet 12.5 mg, 12.5 mg, Oral, BID, Florencia Reasons, MD, 12.5 mg at 08/01/18 2106 .  nystatin (MYCOSTATIN) 100000 UNIT/ML suspension 500,000 Units, 5 mL, Oral, QID, Esterwood, Amy S, PA-C, 500,000 Units at 08/01/18 2106 .  ondansetron (ZOFRAN) injection 4 mg, 4 mg, Intravenous, Q6H PRN, Lennox Grumbles, Belinda K, NP, 4 mg at 07/27/18 2154 .  sodium chloride (PF) 0.9 % injection, , , ,  .  sodium chloride flush (NS) 0.9 % injection 10-40 mL, 10-40 mL, Intracatheter, PRN, Florencia Reasons, MD .  sodium chloride flush (NS) 0.9 % injection 3 mL, 3 mL, Intravenous, Once, Adhikari, Amrit, MD  Facility-Administered Medications Ordered in Other Encounters:  .  gadopentetate dimeglumine (MAGNEVIST) injection 20 mL, 20 mL, Intravenous, Once PRN, Marcial Pacas, MD  . fluconazole (DIFLUCAN) IV Stopped (08/01/18 1309)     Vital signs in last 24 hrs: Vitals:   08/02/18 0700 08/02/18 0800  BP: (!) 143/95 (!) 135/97  Pulse: (!) 105 (!) 112  Resp: (!) 28 (!) 26  Temp:  99.2 F (37.3 C)  SpO2: 96%  98%    Intake/Output Summary (Last 24 hours) at 08/02/2018 0958 Last data filed at 08/02/2018 0817 Gross per 24 hour  Intake 249.14 ml  Output 700 ml  Net -450.86 ml     Physical Exam Acutely ill-appearing, nontoxic.  Somnolent. Breathing comfortably  HEENT: sclera anicteric, oral mucosa not well visualized, no thrush seen  Neck: supple, no thyromegaly, JVD or lymphadenopathy  Cardiac: Cardiac and regular , S1S2 heard, no peripheral edema  Pulm: clear to  auscultation bilaterally, low inspiratory effort normal RR and effort noted  Abdomen: soft, no tenderness, with active bowel sounds. No guarding or palpable hepatosplenomegaly  Skin; warm and dry, no jaundice Neuro extremities, does not seem to have focal weakness  recent Labs:  CBC Latest Ref Rng & Units 08/02/2018 08/01/2018 08/01/2018  WBC 4.0 - 10.5 K/uL 3.0(L) - -  Hemoglobin 12.0 - 15.0 g/dL 8.1(L) 8.5(L) 6.8(LL)  Hematocrit 36.0 - 46.0 % 25.2(L) 26.2(L) 22.2(L)  Platelets 150 - 400 K/uL 151 - -    Recent Labs  Lab 08/01/18 1021  INR 1.4*   CMP Latest Ref Rng & Units 08/02/2018 08/01/2018 07/31/2018  Glucose 70 - 99 mg/dL 83 90 99  BUN 6 - 20 mg/dL 8 10 10   Creatinine 0.44 - 1.00 mg/dL 1.610.57 0.960.62 0.450.69  Sodium 135 - 145 mmol/L 142 143 144  Potassium 3.5 - 5.1 mmol/L 2.9(L) 3.5 3.4(L)  Chloride 98 - 111 mmol/L 118(H) 120(H) 121(H)  CO2 22 - 32 mmol/L 17(L) 17(L) 17(L)  Calcium 8.9 - 10.3 mg/dL 7.1(L) 7.1(L) 7.2(L)  Total Protein 6.5 - 8.1 g/dL 4.0(J5.6(L) 8.1(X5.4(L) 9.1(Y5.5(L)  Total Bilirubin 0.3 - 1.2 mg/dL 0.3 0.3 7.8(G0.2(L)  Alkaline Phos 38 - 126 U/L 74 63 50  AST 15 - 41 U/L 85(H) 98(H) 86(H)  ALT 0 - 44 U/L 33 36 33   CMV PCR pending  Radiologic studies:  CT chest abdomen and pelvis from last evening: (Images personally reviewed)  CLINICAL DATA:  Fever of unknown origin.   EXAM: CT CHEST, ABDOMEN, AND PELVIS WITH CONTRAST   TECHNIQUE: Multidetector CT imaging of the chest, abdomen and pelvis was performed following the standard protocol during bolus administration of intravenous contrast.   CONTRAST:  100mL OMNIPAQUE IOHEXOL 300 MG/ML SOLN, 30mL OMNIPAQUE IOHEXOL 300 MG/ML SOLN   COMPARISON:  None.   FINDINGS: CT CHEST FINDINGS   Cardiovascular: There is no large centrally located pulmonary embolus. Detection of smaller segmental and subsegmental pulmonary emboli is limited by contrast bolus timing. The main pulmonary artery is within normal limits for size. There is  no CT evidence of acute right heart strain. The visualized aorta is normal. Heart size is normal. There is a trace pericardial effusion.   Mediastinum/Nodes:   --there are mildly prominent mediastinal and hilar lymph nodes bilaterally.   --there is mild bilateral axillary adenopathy.   --No supraclavicular lymphadenopathy.   --Normal thyroid gland.   --The esophagus is unremarkable   Lungs/Pleura: There are trace bilateral pleural effusions with adjacent atelectasis. There is scattered ground-glass airspace opacities bilaterally, greatest within the right upper lobe. There is no pneumothorax. The trachea is unremarkable.   Musculoskeletal: No chest wall abnormality. No acute or significant osseous findings. 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   CT ABDOMEN PELVIS FINDINGS   Hepatobiliary: The liver is borderline enlarged. Status post cholecystectomy.There is no biliary ductal dilation.   Pancreas: There is some mild peripancreatic fat stranding.   Spleen: There is a small  wedge-shaped defect in the lower pole the spleen measuring approximately 1.8 cm (axial series 2 image 47).   Adrenals/Urinary Tract:   --Adrenal glands: No adrenal hemorrhage.   --Right kidney/ureter: No hydronephrosis or perinephric hematoma.   --Left kidney/ureter: No hydronephrosis or perinephric hematoma.   --Urinary bladder: Unremarkable.   Stomach/Bowel:   --Stomach/Duodenum: No hiatal hernia or other gastric abnormality. Normal duodenal course and caliber.   --Small bowel: No dilatation or inflammation.   --Colon: No focal abnormality.   --Appendix: Normal.   Vascular/Lymphatic: Normal course and caliber of the major abdominal vessels. There is a retroaortic left renal vein, a normal variant.   --there are prominent retroperitoneal lymph nodes.   --there are prominent mesenteric lymph nodes. There is haziness throughout the mesentery. There are  enlarged right lower quadrant lymph nodes measuring approximately 1.1 cm in diameter.   --there are mildly enlarged pelvic and inguinal lymph nodes.   Reproductive: There is a small amount of free fluid in the pelvis which may be physiologic. There is no discrete ovarian mass.   Other: There is a small volume of pelvic free fluid which is likely physiologic. No free air. There is some mild fat stranding involving the anterior abdominal wall bilaterally.   Musculoskeletal. No acute displaced fractures.   IMPRESSION: 1. Scattered ground-glass airspace opacities, greatest within the right upper lobe, concerning for multifocal pneumonia. 2. Small bilateral pleural effusions with adjacent atelectasis. 3. Mild diffuse adenopathy throughout the chest, abdomen, and pelvis of unknown clinical significance. 4. Peripancreatic free fluid with edema within the root of the mesentery. Findings are nonspecific and can be seen in patients with pancreatitis, versus less likely sclerosing mesenteritis or inflammatory bowel disease. There are some prominent lymph nodes right lower quadrant which can be seen in patients with mesenteric adenitis. 5. Normal appendix in the right lower quadrant. 6. Small volume free fluid in the pelvis favored to be physiologic. 7. Wedge-shaped hypoattenuating defect in the spleen of unknown clinical significance. Differential considerations include a splenic infarct versus less likely a splenic laceration, especially in the absence of a history of trauma. There is no perisplenic hematoma.     Electronically Signed   By: Katherine Mantlehristopher  Dhillon M.D.   On: 08/02/2018 00:18  @ASSESSMENTPLANBEGIN @ Assessment: Odynophagia due to oral candidiasis, improved on fluconazole.  Fever and suggestion of systemic illness/inflammatory process, source unclear.  Her CT scan findings suggest airspace bilaterally, unknown if infectious or only atelectasis from effusions due to fluid  overload in the setting of severe hypoalbuminemia.  I do not believe she has pancreatitis despite these imaging findings.  She has diffuse adenopathy chest abdomen and pelvis suggestive of systemic infectious or inflammatory condition, cause of which currently unknown.  CMV PCR pending after positive IgM antibody.  Elevated LFTs, probably multifactorial, perhaps medication related or also from acute systemic illness.  Stable, no hepatic failure.  Plan: There seems to been a question of whether upper endoscopy is necessary to look for CMV esophagitis.  Her history is somewhat limited by somnolence today, but sounds like her esophagitis was improving when seen in reevaluation yesterday. It is not clear to me that upper endoscopy is necessary at this point.  If the infectious disease service feels that it might be helpful, depending on results of PCR, then this will be reconsidered.  Complete fluconazole course for candidiasis in setting of immune compromised status per the ID service.  Typically 21 days.  We will check back on this patient tomorrow.  Total time  35 minutes extensive chart review and study review required.   Charlie PitterHenry L Danis III Office: [phone]

## 2018-08-03 DIAGNOSIS — R002 Palpitations: Secondary | ICD-10-CM

## 2018-08-03 LAB — RENAL FUNCTION PANEL
Albumin: 1.8 g/dL — ABNORMAL LOW (ref 3.5–5.0)
Anion gap: 6 (ref 5–15)
BUN: 7 mg/dL (ref 6–20)
CO2: 18 mmol/L — ABNORMAL LOW (ref 22–32)
Calcium: 7.3 mg/dL — ABNORMAL LOW (ref 8.9–10.3)
Chloride: 118 mmol/L — ABNORMAL HIGH (ref 98–111)
Creatinine, Ser: 0.57 mg/dL (ref 0.44–1.00)
GFR calc Af Amer: >60 mL/min (ref >60)
GFR calc non Af Amer: >60 mL/min (ref >60)
Glucose, Bld: 90 mg/dL (ref 70–99)
Phosphorus: <1 mg/dL — CL (ref 2.5–4.6)
Potassium: 3.5 mmol/L (ref 3.5–5.1)
Sodium: 142 mmol/L (ref 135–145)

## 2018-08-03 LAB — MAGNESIUM: Magnesium: 1.7 mg/dL (ref 1.7–2.4)

## 2018-08-03 LAB — TSH: TSH: 0.901 u[IU]/mL (ref 0.350–4.500)

## 2018-08-03 MED ORDER — POTASSIUM PHOSPHATES 15 MMOLE/5ML IV SOLN
30.0000 mmol | Freq: Once | INTRAVENOUS | Status: AC
  Administered 2018-08-03: 30 mmol via INTRAVENOUS
  Filled 2018-08-03: qty 10

## 2018-08-03 MED ORDER — ENOXAPARIN SODIUM 40 MG/0.4ML ~~LOC~~ SOLN
40.0000 mg | SUBCUTANEOUS | Status: DC
  Administered 2018-08-03 – 2018-08-08 (×6): 40 mg via SUBCUTANEOUS
  Filled 2018-08-03 (×6): qty 0.4

## 2018-08-03 MED ORDER — FOLIC ACID 1 MG PO TABS
2.0000 mg | ORAL_TABLET | Freq: Every day | ORAL | Status: DC
  Administered 2018-08-03 – 2018-08-16 (×12): 2 mg via ORAL
  Filled 2018-08-03 (×14): qty 2

## 2018-08-03 MED ORDER — MAGNESIUM SULFATE 2 GM/50ML IV SOLN
2.0000 g | Freq: Once | INTRAVENOUS | Status: AC
  Administered 2018-08-03: 2 g via INTRAVENOUS
  Filled 2018-08-03: qty 50

## 2018-08-03 NOTE — Progress Notes (Signed)
Progress Note    Emily Hicks  ZOX:[id number] DOB: [birth year]  DOA: 07/27/2018 PCP: Leland HerYoo, Elsia J, DO    Brief Narrative:     Medical records reviewed and are as summarized below:  Emily Hicks is an 33 y.o. female with medical history significant of multiple sclerosis, depression who was brought by her husband to the emergency department for the evaluation of altered mental status, fever, generalized weakness.  H/o MS off aubagio a week ago due to intolerance, presents with Confusion, weakness, loss of appetite, fever, sepsis.  Found to have severe oropharyngeal thrush with odynophagia.  Emily Hicks has much improved, odynophagia has resolved, but still spike fever  Assessment/Plan:   Principal Problem:   Severe sepsis (HCC) Active Problems:   MS (multiple sclerosis) (HCC)   Tachycardia   Leukopenia   AKI (acute kidney injury) (HCC)   Hypernatremia   Fever   AMS (altered mental status)   Acute metabolic encephalopathy   Hypokalemia   Candida esophagitis (HCC)   Hypomagnesemia   Normocytic anemia   Pressure injury of skin   Elevated liver enzymes  Sepsis /acute metabolic encephalopathy likely due to oropharyngeal candidiasis + esophageal candida -mrsa screening negative, SARS cov2 screening negative, Blood culture negative, urine culture negative, cxr no acute infiltrate, she denies headache, no nuchal rigidity, mri brain no acute findings - diflucan --CMV Igm elevated at 240, case discussed with ID Emily Hicks who recommend continue current treatment course since patient appear is improving--- could be nonspecific  -hsv prc negative, EBV IgG+ but IgM- -hiv/RPR negative -continues to spike fever- 08/02/2018: Patient seen by infectious disease team.  De-escalation of antibiotics advised -GI and infectious disease input appreciated  Hypernatremia: Likely due to dehydration,  Resolved after LR and d5  Hypokalemia: repleted along with Mg  Severe  hypophosphatemia -replete  Sinus tachycardia: Check TSH  Continue to treat underlying process.  AKI Likely prerenal  AKI has resolved.   Abnormal LFTs:  Possible side effect from [place]? Continue to monitor closely.   GI following  Normocytic Anemia: Likely anemia of chronic inflammation. Continue to monitor closely.  H/O MS -stopped Aubagio a week ago due to cytopenia and abnormal liver function -Mri brain no acute findings -Per neurology Emily Hicks, generalized weakness likely pseudo-exacerbation due to acute illness ( sepsis)  Generalized weakness MRI of the lumbar spine is nonrevealing. PT eval/CIR placement when medically stable  obesity Body mass index is 37.07 kg/m.  Weakness: Per PT: inconsistencies with physical presentation (able to amb to bathroom then unable to stand, able to move UEs then unable to feed self). (question if some component of conversion d/o potentially vs cog deficits related to MS/sepsis  Family Communication/Anticipated D/C date and plan/Code Status   DVT prophylaxis: Lovenox ordered. Code Status: Full Code.  Family Communication:  Disposition Plan: pending resolution of fever- can transfer to med surge bed today   Medical Consultants:    ID  GI     Subjective:   Wanting a regular diet (has been on full liquids since Friday)  Objective:    Vitals:   08/03/18 0200 08/03/18 0359 08/03/18 0400 08/03/18 0600  BP: 128/83  121/73 124/83  Pulse: (!) 117  (!) 109 (!) 114  Resp: (!) 24  20 (!) 27  Temp:  (!) 97.4 F (36.3 C)    TempSrc:  Oral    SpO2: 98%  99% 97%  Weight:      Height:  Intake/Output Summary (Last 24 hours) at 08/03/2018 0833 Last data filed at 08/03/2018 0000 Gross per 24 hour  Intake 903.24 ml  Output 375 ml  Net 528.24 ml   Filed Weights   07/27/18 1745 07/31/18 0300  Weight: 83.3 kg 89 kg    Exam: In bed, ill appearing Sinus tachy Lungs clear, no wheezing No breakdown at sacral  area Min LE edema Moves all 4 ext  Data Reviewed:   I have personally reviewed following labs and imaging studies:  Labs: Labs show the following:   Basic Metabolic Panel: Recent Labs  Lab 07/29/18 0219  07/30/18 1320 07/31/18 0221 08/01/18 0255 08/02/18 0500 08/03/18 0620  NA 156*   < > 146* 144 143 142 142  K 2.9*   < > 3.4* 3.4* 3.5 2.9* 3.5  CL 130*   < > 122* 121* 120* 118* 118*  CO2 20*   < > 18* 17* 17* 17* 18*  GLUCOSE 108*   < > 103* 99 90 83 90  BUN 18   < > CREATININE 0.90   < > 0.69 0.69 0.62 0.57 0.57  CALCIUM 7.3*   < > 7.1* 7.2* 7.1* 7.1* 7.3*  MG 1.8  --   --  1.7 1.7 1.8 1.7  PHOS  --   --   --   --   --   --  <1.0*   < > = values in this interval not displayed.   GFR Estimated Creatinine Clearance: 102.5 mL/min (by C-G formula based on SCr of 0.57 mg/dL). Liver Function Tests: Recent Labs  Lab 07/29/18 0219 07/30/18 1320 07/31/18 0221 08/01/18 0255 08/02/18 0500 08/03/18 0620  AST 117* 83* 86* 98* 85*  --   ALT 41 31 33 36 33  --   ALKPHOS 46 47 50 63 74  --   BILITOT 0.6 0.4 0.2* 0.3 0.3  --   PROT 5.6* 5.6* 5.5* 5.4* 5.6*  --   ALBUMIN 1.9* 1.8* 1.7* 1.7* 1.8* 1.8*   No results for input(s): LIPASE, AMYLASE in the last 168 hours. No results for input(s): AMMONIA in the last 168 hours. Coagulation profile Recent Labs  Lab 08/01/18 1021  INR 1.4*    CBC: Recent Labs  Lab 07/29/18 0219 07/30/18 0605 07/31/18 0221 08/01/18 0255 08/01/18 1021 08/01/18 1930 08/02/18 0500  WBC 4.1 2.7* 2.8* 2.9*  --   --  3.0*  NEUTROABS 3.5 2.2 2.0 2.1  --   --  2.2  HGB 7.1* 7.2* 7.2* 6.5* 6.8* 8.5* 8.1*  HCT 23.8* 23.0* 22.8* 20.5* 22.2* 26.2* 25.2*  MCV 89.5 86.8 83.5 84.4  --   --  86.3  PLT 192 172 151 145*  --   --  151   Cardiac Enzymes: No results for input(s): CKTOTAL, CKMB, CKMBINDEX, TROPONINI in the last 168 hours. BNP (last 3 results) No results for input(s): PROBNP in the last 8760 hours. CBG: Recent Labs  Lab  07/31/18 1154 07/31/18 1630  GLUCAP 94 85   D-Dimer: No results for input(s): DDIMER in the last 72 hours. Hgb A1c: No results for input(s): HGBA1C in the last 72 hours. Lipid Profile: No results for input(s): CHOL, HDL, LDLCALC, TRIG, CHOLHDL, LDLDIRECT in the last 72 hours. Thyroid function studies: No results for input(s): TSH, T4TOTAL, T3FREE, THYROIDAB in the last 72 hours.  Invalid input(s): FREET3 Anemia work up: Recent Labs    08/01/18 1021  VITAMINB12 534  FOLATE 4.0*  RETICCTPCT 1.4   Sepsis Labs: Recent Labs  Lab 07/27/18 1248 07/27/18 1838 07/28/18 1349 07/29/18 0219 07/30/18 0605 07/31/18 0221 08/01/18 0255 08/02/18 0500  PROCALCITON  --  0.14 0.16 0.21  --   --   --   --   WBC 2.5*  --  5.3 4.1 2.7* 2.8* 2.9* 3.0*  LATICACIDVEN 2.0* 1.7 1.8  --   --   --   --   --     Microbiology Recent Results (from the past 240 hour(s))  Blood Culture (routine x 2)     Status: None   Collection Time: 07/27/18  2:04 PM   Specimen: BLOOD  Result Value Ref Range Status   Specimen Description   Final    BLOOD LEFT ANTECUBITAL Performed at [facility], [address] [place], [place] [address]    Special Requests   Final    BOTTLES DRAWN AEROBIC AND ANAEROBIC Blood Culture adequate volume Performed at [facility], [address] [place], [place] [address]    Culture   Final    NO GROWTH 5 DAYS Performed at [facility], [address] [place], [place] [address]    Report Status 08/01/2018 FINAL  Final  Blood Culture (routine x 2)     Status: None   Collection Time: 07/27/18  2:09 PM   Specimen: BLOOD  Result Value Ref Range Status   Specimen Description   Final    BLOOD RIGHT ANTECUBITAL Performed at [facility], [address] [place], [place] [address]    Special Requests   Final    BOTTLES DRAWN AEROBIC AND ANAEROBIC Blood Culture adequate volume Performed at [facility], [address] [place], [place] [address]    Culture   Final    NO GROWTH 5 DAYS Performed at [facility], [address] [place], [place] [address]    Report Status 08/01/2018 FINAL  Final  SARS Coronavirus 2 (CEPHEID - Performed in [facility] hospital lab), Hosp Order     Status: None   Collection Time: 07/27/18  2:34 PM   Specimen: Nasopharyngeal Swab  Result Value Ref Range Status   SARS Coronavirus 2 NEGATIVE NEGATIVE Final    Comment: (NOTE) If result is NEGATIVE SARS-CoV-2 target nucleic acids are NOT DETECTED. The SARS-CoV-2 RNA is generally detectable in upper and lower  respiratory specimens during the acute phase of infection. The lowest  concentration of SARS-CoV-2 viral copies this assay can detect is 250  copies / mL. A negative result does not preclude SARS-CoV-2 infection  and should not be used as the sole basis for treatment or other  patient management decisions.  A negative result may occur with  improper specimen collection / handling, submission of specimen other  than nasopharyngeal swab, presence of viral mutation(s) within the  areas targeted by this assay, and inadequate number of viral copies  (<250 copies / mL). A negative result must be combined with clinical  observations, patient history, and epidemiological information. If result is POSITIVE SARS-CoV-2 target nucleic acids are DETECTED. The SARS-CoV-2 RNA is generally detectable in upper and lower  respiratory specimens dur ing the acute phase of infection.  Positive  results are indicative of active infection with SARS-CoV-2.  Clinical  correlation with patient history and other diagnostic information is  necessary to determine patient infection status.  Positive results do  not rule out bacterial infection or co-infection with other viruses.  If result is PRESUMPTIVE POSTIVE SARS-CoV-2 nucleic acids MAY BE PRESENT.   A presumptive positive result was obtained on the submitted  specimen  and confirmed on repeat testing.  While 2019 novel coronavirus  (SARS-CoV-2) nucleic acids may be present in the submitted sample  additional confirmatory testing may be necessary for epidemiological  and / or clinical management purposes  to differentiate between  SARS-CoV-2 and other Sarbecovirus currently known to infect humans.  If clinically indicated additional testing with an alternate test  methodology [phone]) is advised. The SARS-CoV-2 RNA is generally  detectable in upper and lower respiratory sp ecimens during the acute  phase of infection. The expected result is Negative. Fact Sheet for Patients:  BoilerBrush.com.cy Fact Sheet for Healthcare Providers: https://pope.com/ This test is not yet approved or cleared by the [place] FDA and has been authorized for detection and/or diagnosis of SARS-CoV-2 by FDA under an Emergency Use Authorization (EUA).  This EUA will remain in effect (meaning this test can be used) for the duration of the COVID-19 declaration under Section 564(b)(1) of the Act, 21 U.S.C. section 360bbb-3(b)(1), unless the authorization is terminated or revoked sooner. Performed at [facility], [address] [place], [place] [address]   Urine culture     Status: None   Collection Time: 07/27/18  5:01 PM   Specimen: Urine, Catheterized  Result Value Ref Range Status   Specimen Description   Final    URINE, CATHETERIZED Performed at [facility], [address], [place], [place] [address]    Special Requests   Final    NONE Performed at [facility], [address], [place], [place] [address]    Culture   Final    NO GROWTH Performed at [facility], [address], [place], [place] [address]    Report Status 07/28/2018 FINAL  Final  MRSA PCR Screening     Status: None   Collection Time: 07/27/18  5:39 PM   Specimen: Nasal  Mucosa; Nasopharyngeal  Result Value Ref Range Status   MRSA by PCR NEGATIVE NEGATIVE Final    Comment:        The GeneXpert MRSA Assay (FDA approved for NASAL specimens only), is one component of a comprehensive MRSA colonization surveillance program. It is not intended to diagnose MRSA infection nor to guide or monitor treatment for MRSA infections. Performed at [facility], [address], [place], [place] [address]     Procedures and diagnostic studies:  Ct Chest W Contrast  Result Date: 08/02/2018 CLINICAL DATA:  Fever of unknown origin. EXAM: CT CHEST, ABDOMEN, AND PELVIS WITH CONTRAST TECHNIQUE: Multidetector CT imaging of the chest, abdomen and pelvis was performed following the standard protocol during bolus administration of intravenous contrast. CONTRAST:  OMNIPAQUE IOHEXOL 300 MG/ML SOLN, 30mL OMNIPAQUE IOHEXOL 300 MG/ML SOLN COMPARISON:  None. FINDINGS: CT CHEST FINDINGS Cardiovascular: There is no large centrally located pulmonary embolus. Detection of smaller segmental and subsegmental pulmonary emboli is limited by contrast bolus timing. The main pulmonary artery is within normal limits for size. There is no CT evidence of acute right heart strain. The visualized aorta is normal. Heart size is normal. There is a trace pericardial effusion. Mediastinum/Nodes: --there are mildly prominent mediastinal and hilar lymph nodes bilaterally. --there is mild bilateral axillary adenopathy. --No supraclavicular lymphadenopathy. --Normal thyroid gland. --The esophagus is unremarkable Lungs/Pleura: There are trace bilateral pleural effusions with adjacent atelectasis. There is scattered ground-glass airspace opacities  bilaterally, greatest within the right upper lobe. There is no pneumothorax. The trachea is unremarkable. Musculoskeletal: No chest wall abnormality. No acute or significant osseous findings. 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 CT ABDOMEN PELVIS FINDINGS Hepatobiliary: The liver is borderline enlarged. Status post cholecystectomy.There is no biliary ductal dilation. Pancreas: There is some mild peripancreatic fat stranding. Spleen: There is a small wedge-shaped defect in the lower pole the spleen measuring approximately 1.8 cm (axial series 2 image 47). Adrenals/Urinary Tract: --Adrenal glands: No adrenal hemorrhage. --Right kidney/ureter: No hydronephrosis or perinephric hematoma. --Left kidney/ureter: No hydronephrosis or perinephric hematoma. --Urinary bladder: Unremarkable. Stomach/Bowel: --Stomach/Duodenum: No hiatal hernia or other gastric abnormality. Normal duodenal course and caliber. --Small bowel: No dilatation or inflammation. --Colon: No focal abnormality. --Appendix: Normal. Vascular/Lymphatic: Normal course and caliber of the major abdominal vessels. There is a retroaortic left renal vein, a normal variant. --there are prominent retroperitoneal lymph nodes. --there are prominent mesenteric lymph nodes. There is haziness throughout the mesentery. There are enlarged right lower quadrant lymph nodes measuring approximately 1.1 cm in diameter. --there are mildly enlarged pelvic and inguinal lymph nodes. Reproductive: There is a small amount of free fluid in the pelvis which may be physiologic. There is no discrete ovarian mass. Other: There is a small volume of pelvic free fluid which is likely physiologic. No free air. There is some mild fat stranding involving the anterior abdominal wall bilaterally. Musculoskeletal. No acute displaced fractures. IMPRESSION: 1. Scattered ground-glass airspace opacities, greatest within the right upper lobe, concerning for multifocal pneumonia. 2. Small bilateral pleural effusions with adjacent atelectasis. 3. Mild diffuse adenopathy throughout the chest, abdomen, and pelvis of unknown clinical significance. 4. Peripancreatic free fluid with edema within the root of the  mesentery. Findings are nonspecific and can be seen in patients with pancreatitis, versus less likely sclerosing mesenteritis or inflammatory bowel disease. There are some prominent lymph nodes right lower quadrant which can be seen in patients with mesenteric adenitis. 5. Normal appendix in the right lower quadrant. 6. Small volume free fluid in the pelvis favored to be physiologic. 7. Wedge-shaped hypoattenuating defect in the spleen of unknown clinical significance. Differential considerations include a splenic infarct versus less likely a splenic laceration, especially in the absence of a history of trauma. There is no perisplenic hematoma. Electronically Signed   By: Constance Holster M.D.   On: 08/02/2018 00:18   Mr Lumbar Spine Wo Contrast  Result Date: 08/02/2018 CLINICAL DATA:  Fever. Lower extremity weakness. History of multiple sclerosis. EXAM: MRI LUMBAR SPINE WITHOUT CONTRAST TECHNIQUE: Multiplanar, multisequence MR imaging of the lumbar spine was performed. No intravenous contrast was administered. COMPARISON:  CT abdomen pelvis from yesterday. FINDINGS: Despite efforts by the technologist and patient, motion artifact is present on today's exam and could not be eliminated. This reduces exam sensitivity and specificity. Segmentation:  Standard. Alignment:  Physiologic. Vertebrae:  No fracture, evidence of discitis, or bone lesion. Conus medullaris and cauda equina: Conus extends to the L1-L2 level. Conus and cauda equina appear normal. Paraspinal and other soft tissues: Symmetric bilateral paraspinous muscle edema. Otherwise negative. Disc levels: No significant disc bulge or herniation. No spinal canal or neuroforaminal stenosis. IMPRESSION: 1.  No acute abnormality or significant degenerative changes. Electronically Signed   By: Titus Dubin M.D.   On: 08/02/2018 09:14   Ct Abdomen Pelvis W Contrast  Result Date: 08/02/2018 CLINICAL DATA:  Fever of unknown origin. EXAM: CT CHEST, ABDOMEN,  AND PELVIS WITH CONTRAST  TECHNIQUE: Multidetector CT imaging of the chest, abdomen and pelvis was performed following the standard protocol during bolus administration of intravenous contrast. CONTRAST:  OMNIPAQUE IOHEXOL 300 MG/ML SOLN, 30mL OMNIPAQUE IOHEXOL 300 MG/ML SOLN COMPARISON:  None. FINDINGS: CT CHEST FINDINGS Cardiovascular: There is no large centrally located pulmonary embolus. Detection of smaller segmental and subsegmental pulmonary emboli is limited by contrast bolus timing. The main pulmonary artery is within normal limits for size. There is no CT evidence of acute right heart strain. The visualized aorta is normal. Heart size is normal. There is a trace pericardial effusion. Mediastinum/Nodes: --there are mildly prominent mediastinal and hilar lymph nodes bilaterally. --there is mild bilateral axillary adenopathy. --No supraclavicular lymphadenopathy. --Normal thyroid gland. --The esophagus is unremarkable Lungs/Pleura: There are trace bilateral pleural effusions with adjacent atelectasis. There is scattered ground-glass airspace opacities bilaterally, greatest within the right upper lobe. There is no pneumothorax. The trachea is unremarkable. Musculoskeletal: No chest wall abnormality. No acute or significant osseous findings. 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 CT ABDOMEN PELVIS FINDINGS Hepatobiliary: The liver is borderline enlarged. Status post cholecystectomy.There is no biliary ductal dilation. Pancreas: There is some mild peripancreatic fat stranding. Spleen: There is a small wedge-shaped defect in the lower pole the spleen measuring approximately 1.8 cm (axial series 2 image 47). Adrenals/Urinary Tract: --Adrenal glands: No adrenal hemorrhage. --Right kidney/ureter: No hydronephrosis or perinephric hematoma. --Left kidney/ureter: No hydronephrosis or perinephric hematoma. --Urinary bladder: Unremarkable. Stomach/Bowel: --Stomach/Duodenum: No hiatal  hernia or other gastric abnormality. Normal duodenal course and caliber. --Small bowel: No dilatation or inflammation. --Colon: No focal abnormality. --Appendix: Normal. Vascular/Lymphatic: Normal course and caliber of the major abdominal vessels. There is a retroaortic left renal vein, a normal variant. --there are prominent retroperitoneal lymph nodes. --there are prominent mesenteric lymph nodes. There is haziness throughout the mesentery. There are enlarged right lower quadrant lymph nodes measuring approximately 1.1 cm in diameter. --there are mildly enlarged pelvic and inguinal lymph nodes. Reproductive: There is a small amount of free fluid in the pelvis which may be physiologic. There is no discrete ovarian mass. Other: There is a small volume of pelvic free fluid which is likely physiologic. No free air. There is some mild fat stranding involving the anterior abdominal wall bilaterally. Musculoskeletal. No acute displaced fractures. IMPRESSION: 1. Scattered ground-glass airspace opacities, greatest within the right upper lobe, concerning for multifocal pneumonia. 2. Small bilateral pleural effusions with adjacent atelectasis. 3. Mild diffuse adenopathy throughout the chest, abdomen, and pelvis of unknown clinical significance. 4. Peripancreatic free fluid with edema within the root of the mesentery. Findings are nonspecific and can be seen in patients with pancreatitis, versus less likely sclerosing mesenteritis or inflammatory bowel disease. There are some prominent lymph nodes right lower quadrant which can be seen in patients with mesenteric adenitis. 5. Normal appendix in the right lower quadrant. 6. Small volume free fluid in the pelvis favored to be physiologic. 7. Wedge-shaped hypoattenuating defect in the spleen of unknown clinical significance. Differential considerations include a splenic infarct versus less likely a splenic laceration, especially in the absence of a history of trauma. There is no  perisplenic hematoma. Electronically Signed   By: Katherine Mantle M.D.   On: 08/02/2018 00:18    Medications:    Chlorhexidine Gluconate Cloth  6 each Topical Daily   feeding supplement  1 Container Oral BID BM   feeding supplement (ENSURE ENLIVE)  237 mL Oral BID BM   fluconazole  200 mg  Oral Daily   folic acid  1 mg Oral Daily   free water  300 mL Oral Q8H   heparin  5,000 Units Subcutaneous Q8H   lidocaine  15 mL Mouth/Throat TID AC & HS   metoprolol tartrate  12.5 mg Oral BID   nystatin  5 mL Oral QID   sodium chloride flush  3 mL Intravenous Once   Continuous Infusions:  potassium PHOSPHATE IVPB (in mmol)       LOS: 7 days   Joseph ArtJessica U [place]  [facility]   How to contact the [facility] Attending or Consulting provider 7A - 7P or covering provider during after hours 7P -7A, for this patient?  1. Check the care team in [facility] and look for a) attending/consulting [facility] provider listed and b) the [facility] team listed 2. Log into [email or web address] and use [place]'s universal password to access. If you do not have the password, please contact the hospital operator. 3. Locate the [facility] provider you are looking for under [facility] and page to a number that you can be directly reached. 4. If you still have difficulty reaching the provider, please page the [facility] (Director on Call) for the Hospitalists listed on amion for assistance.  08/03/2018, 8:33 AM

## 2018-08-03 NOTE — Progress Notes (Signed)
Patient ID: Emily DublinJamie J Hicks, female   DOB: 10-23-1985, 33 y.o.   MRN: [id number]         [facility]  Date of Admission:  07/27/2018           Day 7 fluconazole ASSESSMENT: Her dysphagia and odynophagia have resolved on therapy for probable Candida esophagitis.  However, she continues to have intermittent fevers.  She has had elevated liver enzymes for several months.  CMV IgM antibody is elevated but not absolutely certain that this explains her acute febrile illness.  Think that she has pneumonia or a urinary tract infection.  PLAN: 1. Continue fluconazole for now 2. CMV DNA viral load is pending  Principal Problem:   Severe sepsis (HCC) Active Problems:   Fever   AMS (altered mental status)   Candida esophagitis (HCC)   MS (multiple sclerosis) (HCC)   Tachycardia   Leukopenia   AKI (acute kidney injury) (HCC)   Hypernatremia   Acute metabolic encephalopathy   Hypokalemia   Hypomagnesemia   Normocytic anemia   Pressure injury of skin   Elevated liver enzymes   Scheduled Meds: . Chlorhexidine Gluconate Cloth  6 each Topical Daily  . enoxaparin (LOVENOX) injection  40 mg Subcutaneous Q24H  . feeding supplement  1 Container Oral BID BM  . feeding supplement (ENSURE ENLIVE)  237 mL Oral BID BM  . fluconazole  200 mg Oral Daily  . folic acid  2 mg Oral Daily  . free water  300 mL Oral Q8H  . lidocaine  15 mL Mouth/Throat TID AC & HS  . metoprolol tartrate  12.5 mg Oral BID  . nystatin  5 mL Oral QID  . sodium chloride flush  3 mL Intravenous Once   Continuous Infusions: . magnesium sulfate bolus IVPB    . potassium PHOSPHATE IVPB (in mmol)     PRN Meds:.acetaminophen, lip balm, LORazepam, metoprolol tartrate, ondansetron (ZOFRAN) IV, sodium chloride flush   SUBJECTIVE: She is feeling a little bit better today.  The pain and difficulty with swallowing is gone.  Review of Systems: Review of Systems  Constitutional: Positive for fever.  Negative for chills and diaphoresis.  HENT:       As noted in HPI.  Respiratory: Positive for shortness of breath. Negative for cough and sputum production.   Cardiovascular: Positive for palpitations. Negative for chest pain.  Gastrointestinal: Negative for abdominal pain, diarrhea, nausea and vomiting.  Genitourinary: Negative for dysuria.    No Known Allergies  OBJECTIVE: Vitals:   08/03/18 0359 08/03/18 0400 08/03/18 0600 08/03/18 0800  BP:  121/73 124/83   Pulse:  (!) 109 (!) 114   Resp:  20 (!) 27   Temp: (!) 97.4 F (36.3 C)   (!) 101.8 F (38.8 C)  TempSrc: Oral   Oral  SpO2:  99% 97%   Weight:      Height:       Body mass index is 37.07 kg/m.  Physical Exam Constitutional:      Comments: She is resting quietly in bed.  She is in no distress.  She speaks slowly and softly.  HENT:     Mouth/Throat:     Pharynx: No oropharyngeal exudate.  Cardiovascular:     Rate and Rhythm: Regular rhythm.     Heart sounds: No murmur.     Comments: She is tachycardic. Pulmonary:     Effort: Pulmonary effort is normal.     Breath sounds: Normal breath sounds.  No wheezing or rales.  Abdominal:     Palpations: Abdomen is soft. There is no mass.     Tenderness: There is no abdominal tenderness.     Lab Results Lab Results  Component Value Date   WBC 3.0 (L) 08/02/2018   HGB 8.1 (L) 08/02/2018   HCT 25.2 (L) 08/02/2018   MCV 86.3 08/02/2018   PLT 151 08/02/2018    Lab Results  Component Value Date   CREATININE 0.57 08/03/2018   BUN 7 08/03/2018   NA 142 08/03/2018   K 3.5 08/03/2018   CL 118 (H) 08/03/2018   CO2 18 (L) 08/03/2018    Lab Results  Component Value Date   ALT 33 08/02/2018   AST 85 (H) 08/02/2018   ALKPHOS 74 08/02/2018   BILITOT 0.3 08/02/2018     Microbiology: Recent Results (from the past 240 hour(s))  Blood Culture (routine x 2)     Status: None   Collection Time: 07/27/18  2:04 PM   Specimen: BLOOD  Result Value Ref Range Status    Specimen Description   Final    BLOOD LEFT ANTECUBITAL Performed at [facility], [place] [address], [place], [place] [address]    Special Requests   Final    BOTTLES DRAWN AEROBIC AND ANAEROBIC Blood Culture adequate volume Performed at [place] [address] [place], [place] [address]    Culture   Final    NO GROWTH 5 DAYS Performed at [facility], [place] [address] [place], [place] [address]    Report Status 08/01/2018 FINAL  Final  Blood Culture (routine x 2)     Status: None   Collection Time: 07/27/18  2:09 PM   Specimen: BLOOD  Result Value Ref Range Status   Specimen Description   Final    BLOOD RIGHT ANTECUBITAL Performed at [facility] [address] [place], [place] [address]    Special Requests   Final    BOTTLES DRAWN AEROBIC AND ANAEROBIC Blood Culture adequate volume Performed at [place] [address], [place], [place] [address]    Culture   Final    NO GROWTH 5 DAYS Performed at [facility], [place] [address], [place], [place] [address]    Report Status 08/01/2018 FINAL  Final  SARS Coronavirus 2 (CEPHEID - Performed in [facility] hospital lab), Hosp Order     Status: None   Collection Time: 07/27/18  2:34 PM   Specimen: Nasopharyngeal Swab  Result Value Ref Range Status   SARS Coronavirus 2 NEGATIVE NEGATIVE Final    Comment: (NOTE) If result is NEGATIVE SARS-CoV-2 target nucleic acids are NOT DETECTED. The SARS-CoV-2 RNA is generally detectable in upper and lower  respiratory specimens during the acute phase of infection. The lowest  concentration of SARS-CoV-2 viral copies this assay can detect is 250  copies / mL. A negative result does not preclude SARS-CoV-2 infection  and should not be used as the sole basis for treatment or other  patient management decisions.  A negative result may occur with  improper specimen collection / handling, submission of  specimen other  than nasopharyngeal swab, presence of viral mutation(s) within the  areas targeted by this assay, and inadequate number of viral copies  (<250 copies / mL). A negative result must be combined with clinical  observations, patient history, and epidemiological information. If result is POSITIVE SARS-CoV-2 target nucleic acids are DETECTED. The SARS-CoV-2  RNA is generally detectable in upper and lower  respiratory specimens dur ing the acute phase of infection.  Positive  results are indicative of active infection with SARS-CoV-2.  Clinical  correlation with patient history and other diagnostic information is  necessary to determine patient infection status.  Positive results do  not rule out bacterial infection or co-infection with other viruses. If result is PRESUMPTIVE POSTIVE SARS-CoV-2 nucleic acids MAY BE PRESENT.   A presumptive positive result was obtained on the submitted specimen  and confirmed on repeat testing.  While 2019 novel coronavirus  (SARS-CoV-2) nucleic acids may be present in the submitted sample  additional confirmatory testing may be necessary for epidemiological  and / or clinical management purposes  to differentiate between  SARS-CoV-2 and other Sarbecovirus currently known to infect humans.  If clinically indicated additional testing with an alternate test  methodology [phone]) is advised. The SARS-CoV-2 RNA is generally  detectable in upper and lower respiratory sp ecimens during the acute  phase of infection. The expected result is Negative. Fact Sheet for Patients:  BoilerBrush.com.cy Fact Sheet for Healthcare Providers: https://pope.com/ This test is not yet approved or cleared by the [place] FDA and has been authorized for detection and/or diagnosis of SARS-CoV-2 by FDA under an Emergency Use Authorization (EUA).  This EUA will remain in effect (meaning this test can be used) for the  duration of the COVID-19 declaration under Section 564(b)(1) of the Act, 21 U.S.C. section 360bbb-3(b)(1), unless the authorization is terminated or revoked sooner. Performed at [facility], [address] [place], [place] [address]   Urine culture     Status: None   Collection Time: 07/27/18  5:01 PM   Specimen: Urine, Catheterized  Result Value Ref Range Status   Specimen Description   Final    URINE, CATHETERIZED Performed at [facility], [address] [place], [place] [address]    Special Requests   Final    NONE Performed at [facility], [address], [place], [place] [address]    Culture   Final    NO GROWTH Performed at [facility], [address] [place] [address], [place], [place] [address]    Report Status 07/28/2018 FINAL  Final  MRSA PCR Screening     Status: None   Collection Time: 07/27/18  5:39 PM   Specimen: Nasal Mucosa; Nasopharyngeal  Result Value Ref Range Status   MRSA by PCR NEGATIVE NEGATIVE Final    Comment:        The GeneXpert MRSA Assay (FDA approved for NASAL specimens only), is one component of a comprehensive MRSA colonization surveillance program. It is not intended to diagnose MRSA infection nor to guide or monitor treatment for MRSA infections. Performed at [facility], [address], [place], [place] [address]     Cliffton Asters, MD [facility] [phone] pager   [phone] cell 08/03/2018, 10:10 AM

## 2018-08-03 NOTE — Progress Notes (Signed)
Inpatient Rehab Admissions Coordinator:    Note pt with ongoing fevers.  Will continue to follow for timing of possible rehab admit.   Shann Medal, PT, DPT Admissions Coordinator [phone] 08/03/18  10:47 AM

## 2018-08-03 NOTE — Progress Notes (Signed)
[facility] Progress Note  Chief Complaint: oral candidiasis  History:  Patient remains lethargic, but more interactive today than when I saw her yesterday (after she had Ativan for MRI) Reports able to swallow liquid diet without difficulty or pain. Has not been offered regular food.  Denies abdominal pain  ID consultant progress note from yesterday reviewed.  No longer feels EGD necessary. CMV PCR still pending.  ROS: Cardiovascular:  Denies chest pain Respiratory:  Denies cough Urinary:  Denies dysuria  Objective:   Current Facility-Administered Medications:  .  acetaminophen (TYLENOL) tablet 650 mg, 650 mg, Oral, Q6H PRN, Evonnie DawesKyere, Belinda K, NP, 650 mg at 08/03/18 0809 .  Chlorhexidine Gluconate Cloth 2 % PADS 6 each, 6 each, Topical, Daily, Albertine GratesXu, Fang, MD, 6 each at 08/02/18 [phone] .  enoxaparin (LOVENOX) injection 40 mg, 40 mg, Subcutaneous, Q24H, Vann, Jessica U, DO .  feeding supplement (BOOST / RESOURCE BREEZE) liquid 1 Container, 1 Container, Oral, BID BM, Albertine GratesXu, Fang, MD, 1 Container at 08/03/18 [phone] .  feeding supplement (ENSURE ENLIVE) (ENSURE ENLIVE) liquid 237 mL, 237 mL, Oral, BID BM, Albertine GratesXu, Fang, MD, 237 mL at 08/02/18 1954 .  fluconazole (DIFLUCAN) tablet 200 mg, 200 mg, Oral, Daily, Herby AbrahamBell, Michelle T, RPH .  folic acid (FOLVITE) tablet 2 mg, 2 mg, Oral, Daily, Vann, Jessica U, DO .  free water 300 mL, 300 mL, Oral, Q8H, Albertine GratesXu, Fang, MD, 300 mL at 08/02/18 2209 .  lidocaine (XYLOCAINE) 2 % viscous mouth solution 15 mL, 15 mL, Mouth/Throat, TID AC & HS, Esterwood, Amy S, PA-C, 15 mL at 08/03/18 0808 .  lip balm (CARMEX) ointment 1 application, 1 application, Topical, PRN, Albertine GratesXu, Fang, MD .  LORazepam (ATIVAN) tablet 0.5 mg, 0.5 mg, Oral, Q6H PRN, Adhikari, Amrit, MD .  magnesium sulfate IVPB 2 g 50 mL, 2 g, Intravenous, Once, Vann, Jessica U, DO .  metoprolol tartrate (LOPRESSOR) injection 2.5 mg, 2.5 mg, Intravenous, Q4H PRN, Albertine GratesXu, Fang, MD, 2.5 mg at 08/02/18 1418 .  metoprolol  tartrate (LOPRESSOR) tablet 12.5 mg, 12.5 mg, Oral, BID, Albertine GratesXu, Fang, MD, 12.5 mg at 08/02/18 2201 .  nystatin (MYCOSTATIN) 100000 UNIT/ML suspension 500,000 Units, 5 mL, Oral, QID, Esterwood, Amy S, PA-C, 500,000 Units at 08/02/18 2201 .  ondansetron (ZOFRAN) injection 4 mg, 4 mg, Intravenous, Q6H PRN, Evonnie DawesKyere, Belinda K, NP, 4 mg at 07/27/18 2154 .  potassium PHOSPHATE 30 mmol in dextrose 5 % 500 mL infusion, 30 mmol, Intravenous, Once, Vann, Jessica U, DO .  sodium chloride flush (NS) 0.9 % injection 10-40 mL, 10-40 mL, Intracatheter, PRN, Albertine GratesXu, Fang, MD .  sodium chloride flush (NS) 0.9 % injection 3 mL, 3 mL, Intravenous, Once, Adhikari, Amrit, MD  Facility-Administered Medications Ordered in Other Encounters:  .  gadopentetate dimeglumine (MAGNEVIST) injection 20 mL, 20 mL, Intravenous, Once PRN, Levert FeinsteinYan, Yijun, MD  . magnesium sulfate bolus IVPB    . potassium PHOSPHATE IVPB (in mmol)       Vital signs in last 24 hrs: Vitals:   08/03/18 0600 08/03/18 0800  BP: 124/83   Pulse: (!) 114   Resp: (!) 27   Temp:  (!) 101.8 F (38.8 C)  SpO2: 97%     Intake/Output Summary (Last 24 hours) at 08/03/2018 0902 Last data filed at 08/03/2018 0000 Gross per 24 hour  Intake 903.24 ml  Output 375 ml  Net 528.24 ml     Physical Exam Chronically ill-appearing and generally weak  HEENT: sclera anicteric, oral mucosa without lesions -  no thrush seen  Neck: supple, no thyromegaly, JVD or lymphadenopathy  Cardiac: RRR without murmurs, S1S2 heard, + peripheral edema  Pulm: clear to auscultation bilaterally, normal RR and effort noted  Abdomen: soft, no tenderness, with active bowel sounds. No guarding or palpable hepatosplenomegaly  Recent Labs:  CBC Latest Ref Rng & Units 08/02/2018 08/01/2018 08/01/2018  WBC 4.0 - 10.5 K/uL 3.0(L) - -  Hemoglobin 12.0 - 15.0 g/dL 8.1(L) 8.5(L) 6.8(LL)  Hematocrit 36.0 - 46.0 % 25.2(L) 26.2(L) 22.2(L)  Platelets 150 - 400 K/uL 151 - -    Recent Labs  Lab  08/01/18 1021  INR 1.4*   CMP Latest Ref Rng & Units 08/03/2018 08/02/2018 08/01/2018  Glucose 70 - 99 mg/dL 90 83 90  BUN 6 - 20 mg/dL 7 8 10   Creatinine 0.44 - 1.00 mg/dL 0.57 0.57 0.62  Sodium 135 - 145 mmol/L 142 142 143  Potassium 3.5 - 5.1 mmol/L 3.5 2.9(L) 3.5  Chloride 98 - 111 mmol/L 118(H) 118(H) 120(H)  CO2 22 - 32 mmol/L 18(L) 17(L) 17(L)  Calcium 8.9 - 10.3 mg/dL 7.3(L) 7.1(L) 7.1(L)  Total Protein 6.5 - 8.1 g/dL - 5.6(L) 5.4(L)  Total Bilirubin 0.3 - 1.2 mg/dL - 0.3 0.3  Alkaline Phos 38 - 126 U/L - 74 63  AST 15 - 41 U/L - 85(H) 98(H)  ALT 0 - 44 U/L - 33 36     @ASSESSMENTPLANBEGIN @ Assessment: Odynophagia Oral candidiasis (and probable esophageal candidiasis) - improved on fluconazole  The cause of her systemic infectious or inflammatory illness remains a mystery at present.  Plan: Soft diet ordered after discussion with Dr. Eliseo Squires of [facility]. I agree an EGD is not needed at this point. No further recommendations - signing off and please call as needed.   Nelida Meuse III Office: [phone]

## 2018-08-04 DIAGNOSIS — R400 Somnolence: Secondary | ICD-10-CM

## 2018-08-04 LAB — URINALYSIS, ROUTINE W REFLEX MICROSCOPIC
Bilirubin Urine: NEGATIVE
Glucose, UA: NEGATIVE mg/dL
Hgb urine dipstick: NEGATIVE
Ketones, ur: NEGATIVE mg/dL
Leukocytes,Ua: NEGATIVE
Nitrite: NEGATIVE
Protein, ur: 100 mg/dL — AB
Specific Gravity, Urine: 1.018 (ref 1.005–1.030)
pH: 6 (ref 5.0–8.0)

## 2018-08-04 LAB — PHOSPHORUS: Phosphorus: 1.4 mg/dL — ABNORMAL LOW (ref 2.5–4.6)

## 2018-08-04 LAB — LIPASE, BLOOD: Lipase: 45 U/L (ref 11–51)

## 2018-08-04 LAB — CBC
HCT: 25.2 % — ABNORMAL LOW (ref 36.0–46.0)
Hemoglobin: 7.8 g/dL — ABNORMAL LOW (ref 12.0–15.0)
MCH: 27.1 pg (ref 26.0–34.0)
MCHC: 31 g/dL (ref 30.0–36.0)
MCV: 87.5 fL (ref 80.0–100.0)
Platelets: 134 10*3/uL — ABNORMAL LOW (ref 150–400)
RBC: 2.88 MIL/uL — ABNORMAL LOW (ref 3.87–5.11)
RDW: 18.6 % — ABNORMAL HIGH (ref 11.5–15.5)
WBC: 2.7 10*3/uL — ABNORMAL LOW (ref 4.0–10.5)
nRBC: 0.7 % — ABNORMAL HIGH (ref 0.0–0.2)

## 2018-08-04 LAB — BASIC METABOLIC PANEL
Anion gap: 4 — ABNORMAL LOW (ref 5–15)
BUN: 7 mg/dL (ref 6–20)
CO2: 19 mmol/L — ABNORMAL LOW (ref 22–32)
Calcium: 6.9 mg/dL — ABNORMAL LOW (ref 8.9–10.3)
Chloride: 117 mmol/L — ABNORMAL HIGH (ref 98–111)
Creatinine, Ser: 0.44 mg/dL (ref 0.44–1.00)
GFR calc Af Amer: >60 mL/min (ref >60)
GFR calc non Af Amer: >60 mL/min (ref >60)
Glucose, Bld: 103 mg/dL — ABNORMAL HIGH (ref 70–99)
Potassium: 3.3 mmol/L — ABNORMAL LOW (ref 3.5–5.1)
Sodium: 140 mmol/L (ref 135–145)

## 2018-08-04 LAB — MAGNESIUM: Magnesium: 1.7 mg/dL (ref 1.7–2.4)

## 2018-08-04 MED ORDER — PIPERACILLIN-TAZOBACTAM 3.375 G IVPB
3.3750 g | Freq: Three times a day (TID) | INTRAVENOUS | Status: DC
  Administered 2018-08-05 (×3): 3.375 g via INTRAVENOUS
  Filled 2018-08-04 (×4): qty 50

## 2018-08-04 MED ORDER — METOPROLOL TARTRATE 25 MG PO TABS
25.0000 mg | ORAL_TABLET | Freq: Two times a day (BID) | ORAL | Status: DC
  Administered 2018-08-04 – 2018-08-16 (×23): 25 mg via ORAL
  Filled 2018-08-04 (×24): qty 1

## 2018-08-04 MED ORDER — MAGNESIUM SULFATE 2 GM/50ML IV SOLN
2.0000 g | Freq: Once | INTRAVENOUS | Status: AC
  Administered 2018-08-04: 2 g via INTRAVENOUS
  Filled 2018-08-04: qty 50

## 2018-08-04 MED ORDER — BOOST / RESOURCE BREEZE PO LIQD CUSTOM
1.0000 | ORAL | Status: DC
  Administered 2018-08-05 – 2018-08-16 (×9): 1 via ORAL

## 2018-08-04 MED ORDER — POTASSIUM PHOSPHATES 15 MMOLE/5ML IV SOLN
30.0000 mmol | Freq: Once | INTRAVENOUS | Status: AC
  Administered 2018-08-04: 30 mmol via INTRAVENOUS
  Filled 2018-08-04: qty 10

## 2018-08-04 MED ORDER — ENSURE ENLIVE PO LIQD
237.0000 mL | ORAL | Status: DC
  Administered 2018-08-05 – 2018-08-14 (×7): 237 mL via ORAL

## 2018-08-04 MED ORDER — ADULT MULTIVITAMIN W/MINERALS CH
1.0000 | ORAL_TABLET | Freq: Every day | ORAL | Status: DC
  Administered 2018-08-04 – 2018-08-16 (×7): 1 via ORAL
  Filled 2018-08-04 (×13): qty 1

## 2018-08-04 NOTE — Progress Notes (Signed)
Inpatient Rehab Admissions Coordinator:   Spoke with Cookie, CM re: possible admission for CIR.  I do not have a bed available for patient today, and also note she was febrile off/on yesterday with last tmax of 102.3 last night at 5pm.  Will follow today to monitor and plan for possible admission tomorrow pending bed availability and pt remaining afebrile.    Shann Medal, PT, DPT Admissions Coordinator [phone] 08/04/18  10:01 AM

## 2018-08-04 NOTE — Progress Notes (Signed)
PT Cancellation Note  Patient Details Name: Emily Hicks MRN: [id number] DOB: [birth year]   Cancelled Treatment:    Reason Eval/Treat Not Completed: Medical issues which prohibited therapy(HR sustained at 120-122, holding PT per RN request.) Will follow.   Blondell Reveal Kistler PT 08/04/2018  Acute Rehabilitation Services Pager [phone] Office [phone]

## 2018-08-04 NOTE — Progress Notes (Signed)
Occupational Therapy Treatment Patient Details Name: Emily Hicks MRN: [id number] DOB: 25-Jul-1985 Today's Date: 08/04/2018    History of present illness This 33 year old female was admitted for progressive weakness and AMS.  She was recently dxd with MS   OT comments  Pt was tachy when therapy first entered. She was really wanting to get OOB.  RN reports that she is moving out of ICU/SDU today, so in bed best for transfer upstair.  After medication given to control HR, pt performed toothbrushing and AAROM exercises. Will continue to follow   Follow Up Recommendations  CIR    Equipment Recommendations  3 in 1 bedside commode    Recommendations for Other Services      Precautions / Restrictions Precautions Precautions: Fall Precaution Comments: monitor vitals, tachy/elevated HR Restrictions Weight Bearing Restrictions: No       Mobility Bed Mobility                  Transfers                      Balance                                           ADL either performed or assessed with clinical judgement   ADL       Grooming: Oral care;Minimal assistance;Bed level                                 General ADL Comments: min A to manage all pieces (toothbrush, paste, emesis basin and cup of water).       Vision       Perception     Praxis      Cognition Arousal/Alertness: Awake/alert Behavior During Therapy: WFL for tasks assessed/performed Overall Cognitive Status: Within Functional Limits for tasks assessed                                          Exercises Exercises: Other exercises Other Exercises Other Exercises: AAROM x 10 FF bil, and abduction.     Shoulder Instructions       General Comments Pt was hoping to get OOB.  Tachy--RN gave her meds.  Moving out of ICU/SDU today so RN requests pt stay in bed for transfer.  HR 112 - 118; RR 22-33  Before medication, HR was in mid 120s     Pertinent Vitals/ Pain       Pain Assessment: No/denies pain  Home Living                                          Prior Functioning/Environment              Frequency  Min 2X/week        Progress Toward Goals  OT Goals(current goals can now be found in the care plan section)  Progress towards OT goals: Progressing toward goals     Plan      Co-evaluation                 AM-PAC OT "6  Clicks" Daily Activity     Outcome Measure   Help from another person eating meals?: A Little Help from another person taking care of personal grooming?: A Little Help from another person toileting, which includes using toliet, bedpan, or urinal?: Total Help from another person bathing (including washing, rinsing, drying)?: A Lot Help from another person to put on and taking off regular upper body clothing?: A Lot Help from another person to put on and taking off regular lower body clothing?: Total 6 Click Score: 12    End of Session    OT Visit Diagnosis: Muscle weakness (generalized) (M62.81)   Activity Tolerance Patient tolerated treatment well(HR)--modified tx plan   Patient Left in bed;with call bell/phone within reach;with bed alarm set   Nurse Communication          Time: [id number] OT Time Calculation (min): 26 min  Charges: OT General Charges $OT Visit: 1 Visit OT Treatments $Self Care/Home Management : 8-22 mins $Therapeutic Exercise: 8-22 mins  Marica OtterMaryellen Amador Braddy, OTR/L Acute Rehabilitation Services [phone] [facility] pager [phone] office 08/04/2018   Minaal Struckman 08/04/2018, 2:35 PM

## 2018-08-04 NOTE — Progress Notes (Signed)
Agree with previous RN's assessment of patient.  Patient received as transfer from ICU.  Patient placed on telemetry and verified with CMT.  Patient oriented to unit and equipment.  Resting comfortably in bed with call button in reach.  Will continue to monitor.

## 2018-08-04 NOTE — Progress Notes (Addendum)
[facility]                                                                              Patient Demographics  Emily Hicks, is a 33 y.o. female, DOB - 05-05-1985, ZOX:[id number]  Admit date - 07/27/2018   Admitting Physician Burnadette Pop, MD  Outpatient Primary MD for the patient is Sandre Kitty, MD  Outpatient specialists:   LOS - 8  days   Medical records reviewed and are as summarized below:    Chief Complaint  Patient presents with   Fever       Brief summary  Emily Hicks is an 33 y.o. female with medical history significant ofmultiple sclerosis, depression who was brought by her husband to the emergency department for the evaluation of altered mental status, fever, generalized weakness. H/o MS off aubagio a week ago due to intolerance, presents with Confusion, weakness, loss of appetite, fever, sepsis.  Found to have severe oropharyngeal thrush with odynophagia.  Emily Hicks has much improved, odynophagia has resolved.  Remittent fever on 08/03/2018.  Awaiting CIR.    Assessment & Plan    Sepsis, likely due to oropharyngeal candidiasis and esophageal Candida, intermittent fevers -COVID-19 screening negative, blood cultures negative so far.  Urine culture negative.  Chest x-ray showed no acute infiltrates.  No meningismal signs. -ID following, for now continue Diflucan.  CMV IgM elevated at 240, ID recommended continue current treatment course as patient appears to be improving could be nonspecific. -HIV/RPR negative, HSV PCR negative, EBV IgG+ but IgM- -Tolerating diet. -GI, ID following. -Follow fever curve. Addendum Spiked temp again 101 at 2100 - repeat blood cultures x2, CXR in am, UA & Cx, lipase  - reviewed recent CT chest, abd and pelvis-> multifocal PNA, peripancreatic fluid, pancreatitis, vs mesenteric adenitis - start IV zosyn, will await ID rec's in am   Hypernatremia -Sodium 157 at the time of admission -Likely due to dehydration,  improving  Hypokalemia, hypomagnesemia -Replaced IV  Severe hypophosphatemia -Replaced  Sinus tachycardia TSH 0.9, likely due to #1  Acute kidney injury -Presented with creatinine of 1.2, improved with IV fluid hydration  Transaminitis -Possible side effect from [place] - GI following  Normocytic anemia Likely due to anemia of chronic disease  History of multiple sclerosis -Recently stopped aubagio a week prior to admission due to pancytopenia, abnormal liver function -MRI of the brain showed no acute findings. -Per neurology, Dr. Delanna Ahmadi generalized weakness likely pseudo-exacerbation due to acute illness/sepsis   Generalized weakness -MRI of the lumbar spine showed no acute abnormality. Plan for CIR when bed available and afebrile for 24 hours -There was a question of possible some component of conversion disorder versus cognitive deficits related to MS/sepsis.  Per PT and consistent with physical presentation, able to ambulate to bathroom then unable to stand, able to move upper extremities and then unable to feed self.  Obesity BMI 37.0   Code Status: full  DVT Prophylaxis:  Lovenox Family Communication: Discussed in detail with the patient, all imaging results, lab results explained to the patient    Disposition Plan: Awaiting CIR, transfer to telemetry floor  Time Spent  in minutes : 35 minutes  Procedures:  none   Consultants:   Infectious disease GI   Antimicrobials:   Anti-infectives (From admission, onward)   Start     Dose/Rate Route Frequency Ordered Stop   08/03/18 1000  fluconazole (DIFLUCAN) tablet 200 mg     200 mg Oral Daily 08/02/18 1500     07/28/18 1700  vancomycin (VANCOCIN) IVPB 750 mg/150 ml premix  Status:  Discontinued     750 mg 150 mL/hr over 60 Minutes Intravenous Every 24 hours 07/27/18 1606 07/28/18 0947   07/28/18 1000  fluconazole (DIFLUCAN) IVPB 200 mg  Status:  Discontinued     200 mg 100 mL/hr over 60 Minutes  Intravenous Every 24 hours 07/28/18 0954 08/02/18 1500   07/27/18 1800  ceFEPIme (MAXIPIME) 2 g in sodium chloride 0.9 % 100 mL IVPB  Status:  Discontinued     2 g 200 mL/hr over 30 Minutes Intravenous Every 8 hours 07/27/18 1606 08/01/18 1414   07/27/18 1730  fluconazole (DIFLUCAN) tablet 200 mg  Status:  Discontinued     200 mg Oral  Once 07/27/18 1717 07/28/18 0953   07/27/18 1615  vancomycin (VANCOCIN) 1,500 mg in sodium chloride 0.9 % 500 mL IVPB     1,500 mg 250 mL/hr over 120 Minutes Intravenous  Once 07/27/18 1606 07/27/18 1851   07/27/18 1415  cefTRIAXone (ROCEPHIN) 1 g in sodium chloride 0.9 % 100 mL IVPB     1 g 200 mL/hr over 30 Minutes Intravenous  Once 07/27/18 1404 07/27/18 1642          Medications  Scheduled Meds:  Chlorhexidine Gluconate Cloth  6 each Topical Daily   enoxaparin (LOVENOX) injection  40 mg Subcutaneous Q24H   feeding supplement  1 Container Oral BID BM   feeding supplement (ENSURE ENLIVE)  237 mL Oral BID BM   fluconazole  200 mg Oral Daily   folic acid  2 mg Oral Daily   free water  300 mL Oral Q8H   lidocaine  15 mL Mouth/Throat TID AC & HS   metoprolol tartrate  12.5 mg Oral BID   nystatin  5 mL Oral QID   sodium chloride flush  3 mL Intravenous Once   Continuous Infusions:  potassium PHOSPHATE IVPB (in mmol) 30 mmol (08/04/18 0857)   PRN Meds:.acetaminophen, lip balm, LORazepam, metoprolol tartrate, ondansetron (ZOFRAN) IV, sodium chloride flush      Subjective:   Emily Hicks was seen and examined today.  Feels generalized weakness otherwise no acute complaints.  No fever overnight, afebrile this morning. Patient denies dizziness, chest pain, shortness of breath, abdominal pain, N/V/D/C.  Objective:   Vitals:   08/04/18 0100 08/04/18 0343 08/04/18 0700 08/04/18 0800  BP: (!) 134/94  (!) 139/99 (!) 142/97  Pulse: (!) 118  (!) 113 (!) 116  Resp: 20  (!) 26 (!) 25  Temp:  98.3 F (36.8 C)  97.8 F (36.6 C)    TempSrc:  Oral  Oral  SpO2: 95%  99% 97%  Weight:      Height:        Intake/Output Summary (Last 24 hours) at 08/04/2018 1118 Last data filed at 08/04/2018 0801 Gross per 24 hour  Intake 827.72 ml  Output 450 ml  Net 377.72 ml     Wt Readings from Last 3 Encounters:  07/31/18 89 kg  07/16/18 72.6 kg  07/06/18 90.7 kg     Exam  General: Alert and oriented x 3,  NAD, ill-appearing  Eyes: PERRLA, EOMI, Anicteric Sclera,  HEENT:    Cardiovascular: S1 S2 auscultated, sinus tachycardia, Regular rate and rhythm.  Respiratory: Clear to auscultation bilaterally, no wheezing, rales or rhonchi  Gastrointestinal: Soft, nontender, nondistended, + bowel sounds  Ext: no pedal edema bilaterally  Neuro:  Musculoskeletal: No digital cyanosis, clubbing  Skin: No rashes  Psych: flat affect, alert and oriented x3    Data Reviewed:  I have personally reviewed following labs and imaging studies  Micro Results Recent Results (from the past 240 hour(s))  Blood Culture (routine x 2)     Status: None   Collection Time: 07/27/18  2:04 PM   Specimen: BLOOD  Result Value Ref Range Status   Specimen Description   Final    BLOOD LEFT ANTECUBITAL Performed at [facility], [address] [place], [place] [address]    Special Requests   Final    BOTTLES DRAWN AEROBIC AND ANAEROBIC Blood Culture adequate volume Performed at [facility], [address] [place], [place] [address]    Culture   Final    NO GROWTH 5 DAYS Performed at [facility], [address] [place], [place] [address]    Report Status 08/01/2018 FINAL  Final  Blood Culture (routine x 2)     Status: None   Collection Time: 07/27/18  2:09 PM   Specimen: BLOOD  Result Value Ref Range Status   Specimen Description   Final    BLOOD RIGHT ANTECUBITAL Performed at [facility], [address] [place], [place] [address]    Special Requests   Final     BOTTLES DRAWN AEROBIC AND ANAEROBIC Blood Culture adequate volume Performed at [facility], [address] [place], [place] [address]    Culture   Final    NO GROWTH 5 DAYS Performed at [facility], [address] [place], [place] [address]    Report Status 08/01/2018 FINAL  Final  SARS Coronavirus 2 (CEPHEID - Performed in [facility] hospital lab), Hosp Order     Status: None   Collection Time: 07/27/18  2:34 PM   Specimen: Nasopharyngeal Swab  Result Value Ref Range Status   SARS Coronavirus 2 NEGATIVE NEGATIVE Final    Comment: (NOTE) If result is NEGATIVE SARS-CoV-2 target nucleic acids are NOT DETECTED. The SARS-CoV-2 RNA is generally detectable in upper and lower  respiratory specimens during the acute phase of infection. The lowest  concentration of SARS-CoV-2 viral copies this assay can detect is 250  copies / mL. A negative result does not preclude SARS-CoV-2 infection  and should not be used as the sole basis for treatment or other  patient management decisions.  A negative result may occur with  improper specimen collection / handling, submission of specimen other  than nasopharyngeal swab, presence of viral mutation(s) within the  areas targeted by this assay, and inadequate number of viral copies  (<250 copies / mL). A negative result must be combined with clinical  observations, patient history, and epidemiological information. If result is POSITIVE SARS-CoV-2 target nucleic acids are DETECTED. The SARS-CoV-2 RNA is generally detectable in upper and lower  respiratory specimens dur ing the acute phase of infection.  Positive  results are indicative of active infection with SARS-CoV-2.  Clinical  correlation with patient history and other diagnostic information is  necessary to determine patient infection status.  Positive results do  not rule out bacterial infection or  co-infection with other viruses. If result is PRESUMPTIVE  POSTIVE SARS-CoV-2 nucleic acids MAY BE PRESENT.   A presumptive positive result was obtained on the submitted specimen  and confirmed on repeat testing.  While 2019 novel coronavirus  (SARS-CoV-2) nucleic acids may be present in the submitted sample  additional confirmatory testing may be necessary for epidemiological  and / or clinical management purposes  to differentiate between  SARS-CoV-2 and other Sarbecovirus currently known to infect humans.  If clinically indicated additional testing with an alternate test  methodology [phone]) is advised. The SARS-CoV-2 RNA is generally  detectable in upper and lower respiratory sp ecimens during the acute  phase of infection. The expected result is Negative. Fact Sheet for Patients:  BoilerBrush.com.cy Fact Sheet for Healthcare Providers: https://pope.com/ This test is not yet approved or cleared by the [place] FDA and has been authorized for detection and/or diagnosis of SARS-CoV-2 by FDA under an Emergency Use Authorization (EUA).  This EUA will remain in effect (meaning this test can be used) for the duration of the COVID-19 declaration under Section 564(b)(1) of the Act, 21 U.S.C. section 360bbb-3(b)(1), unless the authorization is terminated or revoked sooner. Performed at [facility], [address] [place], [place] [address]   Urine culture     Status: None   Collection Time: 07/27/18  5:01 PM   Specimen: Urine, Catheterized  Result Value Ref Range Status   Specimen Description   Final    URINE, CATHETERIZED Performed at [facility], [address], [place], [place] [address]    Special Requests   Final    NONE Performed at [facility], [address], [place], [place] [address]    Culture   Final    NO GROWTH Performed at [facility], [address] [place], [place] [address]    Report Status  07/28/2018 FINAL  Final  MRSA PCR Screening     Status: None   Collection Time: 07/27/18  5:39 PM   Specimen: Nasal Mucosa; Nasopharyngeal  Result Value Ref Range Status   MRSA by PCR NEGATIVE NEGATIVE Final    Comment:        The GeneXpert MRSA Assay (FDA approved for NASAL specimens only), is one component of a comprehensive MRSA colonization surveillance program. It is not intended to diagnose MRSA infection nor to guide or monitor treatment for MRSA infections. Performed at [facility], [address], [place], [place] [address]     Radiology Reports Dg Chest 2 View  Result Date: 07/27/2018 CLINICAL DATA:  Fever and malaise EXAM: CHEST - 2 VIEW COMPARISON:  July 16, 2018 FINDINGS: Lungs are clear. The heart size and pulmonary vascularity are normal. No adenopathy. No bone lesions. IMPRESSION: No edema or consolidation. Electronically Signed   By: Bretta Bang III M.D.   On: 07/27/2018 15:00   Ct Chest W Contrast  Result Date: 08/02/2018 CLINICAL DATA:  Fever of unknown origin. EXAM: CT CHEST, ABDOMEN, AND PELVIS WITH CONTRAST TECHNIQUE: Multidetector CT imaging of the chest, abdomen and pelvis was performed following the standard protocol during bolus administration of intravenous contrast. CONTRAST:  OMNIPAQUE IOHEXOL 300 MG/ML SOLN, 30mL OMNIPAQUE IOHEXOL 300 MG/ML SOLN COMPARISON:  None. FINDINGS: CT CHEST FINDINGS Cardiovascular: There is no large centrally located pulmonary embolus. Detection of smaller segmental and subsegmental pulmonary emboli is limited by contrast bolus timing. The main pulmonary artery is within normal limits for size. There is no CT  evidence of acute right heart strain. The visualized aorta is normal. Heart size is normal. There is a trace pericardial effusion. Mediastinum/Nodes: --there are mildly prominent mediastinal and hilar lymph nodes bilaterally. --there is mild bilateral axillary adenopathy. --No supraclavicular  lymphadenopathy. --Normal thyroid gland. --The esophagus is unremarkable Lungs/Pleura: There are trace bilateral pleural effusions with adjacent atelectasis. There is scattered ground-glass airspace opacities bilaterally, greatest within the right upper lobe. There is no pneumothorax. The trachea is unremarkable. Musculoskeletal: No chest wall abnormality. No acute or significant osseous findings. 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 CT ABDOMEN PELVIS FINDINGS Hepatobiliary: The liver is borderline enlarged. Status post cholecystectomy.There is no biliary ductal dilation. Pancreas: There is some mild peripancreatic fat stranding. Spleen: There is a small wedge-shaped defect in the lower pole the spleen measuring approximately 1.8 cm (axial series 2 image 47). Adrenals/Urinary Tract: --Adrenal glands: No adrenal hemorrhage. --Right kidney/ureter: No hydronephrosis or perinephric hematoma. --Left kidney/ureter: No hydronephrosis or perinephric hematoma. --Urinary bladder: Unremarkable. Stomach/Bowel: --Stomach/Duodenum: No hiatal hernia or other gastric abnormality. Normal duodenal course and caliber. --Small bowel: No dilatation or inflammation. --Colon: No focal abnormality. --Appendix: Normal. Vascular/Lymphatic: Normal course and caliber of the major abdominal vessels. There is a retroaortic left renal vein, a normal variant. --there are prominent retroperitoneal lymph nodes. --there are prominent mesenteric lymph nodes. There is haziness throughout the mesentery. There are enlarged right lower quadrant lymph nodes measuring approximately 1.1 cm in diameter. --there are mildly enlarged pelvic and inguinal lymph nodes. Reproductive: There is a small amount of free fluid in the pelvis which may be physiologic. There is no discrete ovarian mass. Other: There is a small volume of pelvic free fluid which is likely physiologic. No free air. There is some mild fat stranding involving the  anterior abdominal wall bilaterally. Musculoskeletal. No acute displaced fractures. IMPRESSION: 1. Scattered ground-glass airspace opacities, greatest within the right upper lobe, concerning for multifocal pneumonia. 2. Small bilateral pleural effusions with adjacent atelectasis. 3. Mild diffuse adenopathy throughout the chest, abdomen, and pelvis of unknown clinical significance. 4. Peripancreatic free fluid with edema within the root of the mesentery. Findings are nonspecific and can be seen in patients with pancreatitis, versus less likely sclerosing mesenteritis or inflammatory bowel disease. There are some prominent lymph nodes right lower quadrant which can be seen in patients with mesenteric adenitis. 5. Normal appendix in the right lower quadrant. 6. Small volume free fluid in the pelvis favored to be physiologic. 7. Wedge-shaped hypoattenuating defect in the spleen of unknown clinical significance. Differential considerations include a splenic infarct versus less likely a splenic laceration, especially in the absence of a history of trauma. There is no perisplenic hematoma. Electronically Signed   By: Katherine Mantle M.D.   On: 08/02/2018 00:18   Mr Brain Wo Contrast  Result Date: 07/28/2018 CLINICAL DATA:  Encephalopathy with hallucinations EXAM: MRI HEAD WITHOUT CONTRAST TECHNIQUE: Multiplanar, multiecho pulse sequences of the brain and surrounding structures were obtained without intravenous contrast. COMPARISON:  Brain MRI 03/24/2018 FINDINGS: BRAIN: There is no acute infarct, acute hemorrhage or extra-axial collection. The midline structures are normal. The white matter signal is normal for the patient's age. The cerebral and cerebellar volume are age-appropriate. No hydrocephalus. Susceptibility-sensitive sequences show no chronic microhemorrhage or superficial siderosis. No midline shift or other mass effect. VASCULAR: The major intracranial arterial and venous sinus flow voids are normal.  SKULL AND UPPER CERVICAL SPINE: Calvarial bone marrow signal is normal. There is no skull base  mass. Visualized upper cervical spine and soft tissues are normal. SINUSES/ORBITS: No fluid levels or advanced mucosal thickening. No mastoid or middle ear effusion. The orbits are normal. IMPRESSION: Normal brain. Electronically Signed   By: Deatra RobinsonKevin  Herman M.D.   On: 07/28/2018 00:13   Mr Lumbar Spine Wo Contrast  Result Date: 08/02/2018 CLINICAL DATA:  Fever. Lower extremity weakness. History of multiple sclerosis. EXAM: MRI LUMBAR SPINE WITHOUT CONTRAST TECHNIQUE: Multiplanar, multisequence MR imaging of the lumbar spine was performed. No intravenous contrast was administered. COMPARISON:  CT abdomen pelvis from yesterday. FINDINGS: Despite efforts by the technologist and patient, motion artifact is present on today's exam and could not be eliminated. This reduces exam sensitivity and specificity. Segmentation:  Standard. Alignment:  Physiologic. Vertebrae:  No fracture, evidence of discitis, or bone lesion. Conus medullaris and cauda equina: Conus extends to the L1-L2 level. Conus and cauda equina appear normal. Paraspinal and other soft tissues: Symmetric bilateral paraspinous muscle edema. Otherwise negative. Disc levels: No significant disc bulge or herniation. No spinal canal or neuroforaminal stenosis. IMPRESSION: 1.  No acute abnormality or significant degenerative changes. Electronically Signed   By: Obie DredgeWilliam T Derry M.D.   On: 08/02/2018 09:14   Ct Abdomen Pelvis W Contrast  Result Date: 08/02/2018 CLINICAL DATA:  Fever of unknown origin. EXAM: CT CHEST, ABDOMEN, AND PELVIS WITH CONTRAST TECHNIQUE: Multidetector CT imaging of the chest, abdomen and pelvis was performed following the standard protocol during bolus administration of intravenous contrast. CONTRAST:  100mL OMNIPAQUE IOHEXOL 300 MG/ML SOLN, 30mL OMNIPAQUE IOHEXOL 300 MG/ML SOLN COMPARISON:  None. FINDINGS: CT CHEST FINDINGS Cardiovascular:  There is no large centrally located pulmonary embolus. Detection of smaller segmental and subsegmental pulmonary emboli is limited by contrast bolus timing. The main pulmonary artery is within normal limits for size. There is no CT evidence of acute right heart strain. The visualized aorta is normal. Heart size is normal. There is a trace pericardial effusion. Mediastinum/Nodes: --there are mildly prominent mediastinal and hilar lymph nodes bilaterally. --there is mild bilateral axillary adenopathy. --No supraclavicular lymphadenopathy. --Normal thyroid gland. --The esophagus is unremarkable Lungs/Pleura: There are trace bilateral pleural effusions with adjacent atelectasis. There is scattered ground-glass airspace opacities bilaterally, greatest within the right upper lobe. There is no pneumothorax. The trachea is unremarkable. Musculoskeletal: No chest wall abnormality. No acute or significant osseous findings. 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 CT ABDOMEN PELVIS FINDINGS Hepatobiliary: The liver is borderline enlarged. Status post cholecystectomy.There is no biliary ductal dilation. Pancreas: There is some mild peripancreatic fat stranding. Spleen: There is a small wedge-shaped defect in the lower pole the spleen measuring approximately 1.8 cm (axial series 2 image 47). Adrenals/Urinary Tract: --Adrenal glands: No adrenal hemorrhage. --Right kidney/ureter: No hydronephrosis or perinephric hematoma. --Left kidney/ureter: No hydronephrosis or perinephric hematoma. --Urinary bladder: Unremarkable. Stomach/Bowel: --Stomach/Duodenum: No hiatal hernia or other gastric abnormality. Normal duodenal course and caliber. --Small bowel: No dilatation or inflammation. --Colon: No focal abnormality. --Appendix: Normal. Vascular/Lymphatic: Normal course and caliber of the major abdominal vessels. There is a retroaortic left renal vein, a normal variant. --there are prominent retroperitoneal  lymph nodes. --there are prominent mesenteric lymph nodes. There is haziness throughout the mesentery. There are enlarged right lower quadrant lymph nodes measuring approximately 1.1 cm in diameter. --there are mildly enlarged pelvic and inguinal lymph nodes. Reproductive: There is a small amount of free fluid in the pelvis which may be physiologic. There is no discrete ovarian mass. Other: There is a small  volume of pelvic free fluid which is likely physiologic. No free air. There is some mild fat stranding involving the anterior abdominal wall bilaterally. Musculoskeletal. No acute displaced fractures. IMPRESSION: 1. Scattered ground-glass airspace opacities, greatest within the right upper lobe, concerning for multifocal pneumonia. 2. Small bilateral pleural effusions with adjacent atelectasis. 3. Mild diffuse adenopathy throughout the chest, abdomen, and pelvis of unknown clinical significance. 4. Peripancreatic free fluid with edema within the root of the mesentery. Findings are nonspecific and can be seen in patients with pancreatitis, versus less likely sclerosing mesenteritis or inflammatory bowel disease. There are some prominent lymph nodes right lower quadrant which can be seen in patients with mesenteric adenitis. 5. Normal appendix in the right lower quadrant. 6. Small volume free fluid in the pelvis favored to be physiologic. 7. Wedge-shaped hypoattenuating defect in the spleen of unknown clinical significance. Differential considerations include a splenic infarct versus less likely a splenic laceration, especially in the absence of a history of trauma. There is no perisplenic hematoma. Electronically Signed   By: Katherine Mantlehristopher  Vidrine M.D.   On: 08/02/2018 00:18   Dg Chest Port 1 View  Result Date: 07/16/2018 CLINICAL DATA:  Fatigue EXAM: PORTABLE CHEST 1 VIEW COMPARISON:  None. FINDINGS: The heart size and mediastinal contours are within normal limits. Both lungs are clear. The visualized skeletal  structures are unremarkable. IMPRESSION: No active disease. Electronically Signed   By: Alcide CleverMark  Lukens M.D.   On: 07/16/2018 15:18    Lab Data:  CBC: Recent Labs  Lab 07/29/18 0219 07/30/18 0605 07/31/18 0221 08/01/18 0255 08/01/18 1021 08/01/18 1930 08/02/18 0500 08/04/18 0245  WBC 4.1 2.7* 2.8* 2.9*  --   --  3.0* 2.7*  NEUTROABS 3.5 2.2 2.0 2.1  --   --  2.2  --   HGB 7.1* 7.2* 7.2* 6.5* 6.8* 8.5* 8.1* 7.8*  HCT 23.8* 23.0* 22.8* 20.5* 22.2* 26.2* 25.2* 25.2*  MCV 89.5 86.8 83.5 84.4  --   --  86.3 87.5  PLT 192 172 151 145*  --   --  151 134*   Basic Metabolic Panel: Recent Labs  Lab 07/31/18 0221 08/01/18 0255 08/02/18 0500 08/03/18 0620 08/04/18 0245  NA 144 143 142 142 140  K 3.4* 3.5 2.9* 3.5 3.3*  CL 121* 120* 118* 118* 117*  CO2 17* 17* 17* 18* 19*  GLUCOSE 99 90 83 90 103*  BUN 10 10 8 7 7   CREATININE 0.69 0.62 0.57 0.57 0.44  CALCIUM 7.2* 7.1* 7.1* 7.3* 6.9*  MG 1.7 1.7 1.8 1.7 1.7  PHOS  --   --   --  <1.0* 1.4*   GFR: Estimated Creatinine Clearance: 102.5 mL/min (by C-G formula based on SCr of 0.44 mg/dL). Liver Function Tests: Recent Labs  Lab 07/29/18 0219 07/30/18 1320 07/31/18 0221 08/01/18 0255 08/02/18 0500 08/03/18 0620  AST 117* 83* 86* 98* 85*  --   ALT 41 31 33 36 33  --   ALKPHOS 46 47 50 63 74  --   BILITOT 0.6 0.4 0.2* 0.3 0.3  --   PROT 5.6* 5.6* 5.5* 5.4* 5.6*  --   ALBUMIN 1.9* 1.8* 1.7* 1.7* 1.8* 1.8*   No results for input(s): LIPASE, AMYLASE in the last 168 hours. No results for input(s): AMMONIA in the last 168 hours. Coagulation Profile: Recent Labs  Lab 08/01/18 1021  INR 1.4*   Cardiac Enzymes: No results for input(s): CKTOTAL, CKMB, CKMBINDEX, TROPONINI in the last 168 hours. BNP (last 3 results)  No results for input(s): PROBNP in the last 8760 hours. HbA1C: No results for input(s): HGBA1C in the last 72 hours. CBG: Recent Labs  Lab 07/31/18 1154 07/31/18 1630  GLUCAP 94 85   Lipid Profile: No  results for input(s): CHOL, HDL, LDLCALC, TRIG, CHOLHDL, LDLDIRECT in the last 72 hours. Thyroid Function Tests: No results for input(s): TSH, T4TOTAL, FREET4, T3FREE, THYROIDAB in the last 72 hours. Anemia Panel: No results for input(s): VITAMINB12, FOLATE, FERRITIN, TIBC, IRON, RETICCTPCT in the last 72 hours. Urine analysis:    Component Value Date/Time   COLORURINE AMBER (A) 07/27/2018 1701   APPEARANCEUR HAZY (A) 07/27/2018 1701   LABSPEC 1.026 07/27/2018 1701   PHURINE 6.0 07/27/2018 1701   GLUCOSEU NEGATIVE 07/27/2018 1701   HGBUR SMALL (A) 07/27/2018 1701   BILIRUBINUR NEGATIVE 07/27/2018 1701   BILIRUBINUR Neg 05/08/2010 1553   KETONESUR NEGATIVE 07/27/2018 1701   PROTEINUR 100 (A) 07/27/2018 1701   UROBILINOGEN 1.0 11/11/2013 0150   NITRITE NEGATIVE 07/27/2018 1701   LEUKOCYTESUR NEGATIVE 07/27/2018 1701     Brinklee Cisse M.D. [facility] 08/04/2018, 11:18 AM  Pager: [phone] Between 7am to 7pm - call Pager - [phone]  After 7pm go to [email or web address] - password [email or web address]  Call night coverage person covering after 7pm

## 2018-08-04 NOTE — Progress Notes (Signed)
Nutrition Follow-up  DOCUMENTATION CODES:   (unable to assess for malnutrition at this time.)  INTERVENTION:  - will decrease boost breeze from BID to once/day and ensure enlive from BID to once/day.  - continue to encourage PO intakes.  - will continue to monitor for additional nutrition-related needs.    NUTRITION DIAGNOSIS:   Inadequate oral intake related to acute illness, vomiting, poor appetite as evidenced by per patient/family report. -ongoing  GOAL:   Patient will meet greater than or equal to 90% of their needs -unmet  MONITOR:   PO intake, Supplement acceptance, Labs, Weight trends, I & O's  ASSESSMENT:   33 year-old female with medical history significant of multiple sclerosis and depression. She presented to the ED for the evaluation of AMS, fever, and generalized weakness. In the ED, all information was taken from the patient and her husband on phone. Her husband reported that patient has been increasingly weak since last week and that she has a very poor appetite and has not eaten anything. She has been confused and acting differently. Patient was diagnosed with multiple sclerosis about 3 months ago; she follows with neurology. She is usually able to walk with support. Husband states that she has steadily going downhill since last few weeks. Her husband tries to feed her but she often throws up with intakes.  Weight +5.7 kg/13 lb from 6/23-6/27. Noted new stage 2 wound documented as of 6/27. Diet downgraded from Regular to CLD on 6/24 at 9:50 AM then upgraded to FLD on 6/26 at 5:20 PM and then to Soft yesterday (6/30) at 8:30 AM. No intakes documented in flow sheet since 6/23.  Able to talk with RN who reports that this is her first day with patient but that she was informed in report that PO intakes have been very poor the past few days. She states that this AM patient took a few sips of juice, 2 sips of Ensure, and had 1/2 glass of water and was disinterested in  trying/consuming anything else.   Patient was asleep at the time of RD visit; did not want to wake her. Breakfast tray of cereal and milk untouched.   Per notes:  - sepsis likely d/t candidiasis and candida - intermittent fevers - hypernatremia on admission--currently resolved - hypokalemia and hypomagnesemia - generalized weakness.   GI MD note from yesterday AM states patient reported being able to swallow liquids without pain or difficulty. She had not tried solids up to that point.   Medications reviewed; 2 mg folvite/day, 2 g IV Mg sulfate x1 run 7/1, daily multivitamin with minerals, 5 ml mycostatin QID, 30 mmol IV KPhos x1 run 7/1. Labs reviewed; K: 3.3 mmol/l, Cl: 117 mmol/l, Ca: 6.9 mg/dl, Mg: 1.4 mg/dl.     NUTRITION - FOCUSED PHYSICAL EXAM:  unable to complete at this time.   Diet Order:   Diet Order            DIET SOFT Room service appropriate? Yes; Fluid consistency: Thin  Diet effective now              EDUCATION NEEDS:   Not appropriate for education at this time  Skin:  Skin Assessment: Skin Integrity Issues: Skin Integrity Issues:: Stage II Stage II: sacrum (new 6/29)  Last BM:  6/30  Height:   Ht Readings from Last 1 Encounters:  07/27/18 5\' 1"  (1.549 m)    Weight:   Wt Readings from Last 1 Encounters:  07/31/18 89 kg  Ideal Body Weight:  47.7 kg  BMI:  Body mass index is 37.07 kg/m.  Estimated Nutritional Needs:   Kcal:  2250-2415 kcal  Protein:  100-110 grams  Fluid:  >/= 2.2 L/day     Jarome Matin, MS, RD, LDN, [facility] Inpatient Clinical Dietitian Pager # [phone] After hours/weekend pager # [phone]

## 2018-08-04 NOTE — Progress Notes (Signed)
08/04/2018  1258  Notified MD that patients HR 122 and holding. Waiting for response. Will continue to monitor.

## 2018-08-04 NOTE — Progress Notes (Signed)
CIR not able to take pt today. CIR will continue to follow for possible discharge there.

## 2018-08-05 ENCOUNTER — Inpatient Hospital Stay (HOSPITAL_COMMUNITY): Payer: 59

## 2018-08-05 LAB — CBC WITH DIFFERENTIAL/PLATELET
Abs Immature Granulocytes: 0.39 10*3/uL — ABNORMAL HIGH (ref 0.00–0.07)
Basophils Absolute: 0 10*3/uL (ref 0.0–0.1)
Basophils Relative: 1 %
Eosinophils Absolute: 0 10*3/uL (ref 0.0–0.5)
Eosinophils Relative: 0 %
HCT: 27.7 % — ABNORMAL LOW (ref 36.0–46.0)
Hemoglobin: 8.6 g/dL — ABNORMAL LOW (ref 12.0–15.0)
Immature Granulocytes: 14 %
Lymphocytes Relative: 17 %
Lymphs Abs: 0.5 10*3/uL — ABNORMAL LOW (ref 0.7–4.0)
MCH: 28.2 pg (ref 26.0–34.0)
MCHC: 31 g/dL (ref 30.0–36.0)
MCV: 90.8 fL (ref 80.0–100.0)
Monocytes Absolute: 0.1 10*3/uL (ref 0.1–1.0)
Monocytes Relative: 4 %
Neutro Abs: 1.8 10*3/uL (ref 1.7–7.7)
Neutrophils Relative %: 64 %
Platelets: 154 10*3/uL (ref 150–400)
RBC: 3.05 MIL/uL — ABNORMAL LOW (ref 3.87–5.11)
RDW: 19.5 % — ABNORMAL HIGH (ref 11.5–15.5)
WBC: 2.8 10*3/uL — ABNORMAL LOW (ref 4.0–10.5)
nRBC: 0 % (ref 0.0–0.2)

## 2018-08-05 LAB — COMPREHENSIVE METABOLIC PANEL
ALT: 23 U/L (ref 0–44)
AST: 58 U/L — ABNORMAL HIGH (ref 15–41)
Albumin: 1.8 g/dL — ABNORMAL LOW (ref 3.5–5.0)
Alkaline Phosphatase: 83 U/L (ref 38–126)
Anion gap: 5 (ref 5–15)
BUN: 7 mg/dL (ref 6–20)
CO2: 18 mmol/L — ABNORMAL LOW (ref 22–32)
Calcium: 6.8 mg/dL — ABNORMAL LOW (ref 8.9–10.3)
Chloride: 116 mmol/L — ABNORMAL HIGH (ref 98–111)
Creatinine, Ser: 0.49 mg/dL (ref 0.44–1.00)
GFR calc Af Amer: >60 mL/min (ref >60)
GFR calc non Af Amer: >60 mL/min (ref >60)
Glucose, Bld: 110 mg/dL — ABNORMAL HIGH (ref 70–99)
Potassium: 3.2 mmol/L — ABNORMAL LOW (ref 3.5–5.1)
Sodium: 139 mmol/L (ref 135–145)
Total Bilirubin: 0.2 mg/dL — ABNORMAL LOW (ref 0.3–1.2)
Total Protein: 5.2 g/dL — ABNORMAL LOW (ref 6.5–8.1)

## 2018-08-05 LAB — CMV DNA, QUANTITATIVE, PCR
CMV DNA Quant: NEGATIVE IU/mL
Log10 CMV Qn DNA Pl: UNDETERMINED log10 IU/mL

## 2018-08-05 LAB — PROCALCITONIN: Procalcitonin: 0.19 ng/mL

## 2018-08-05 MED ORDER — FLUCONAZOLE 100 MG PO TABS
100.0000 mg | ORAL_TABLET | Freq: Every day | ORAL | Status: DC
  Administered 2018-08-06 – 2018-08-07 (×2): 100 mg via ORAL
  Filled 2018-08-05 (×2): qty 1

## 2018-08-05 NOTE — Progress Notes (Signed)
Pharmacy Antibiotic Note  Emily Hicks is a 33 y.o. female admitted on 07/27/2018 with Intra-abdominal infection and sepsis.  Pharmacy has been consulted for zosyn dosing.  Plan: Zosyn 3.375g IV q8h (4 hour infusion).  Height: 5\' 1"  (154.9 cm) Weight: 196 lb 3.4 oz (89 kg) IBW/kg (Calculated) : 47.8  Temp (24hrs), Avg:99.6 F (37.6 C), Min:97.8 F (36.6 C), Max:101 F (38.3 C)  Recent Labs  Lab 07/30/18 0605  07/31/18 0221 08/01/18 0255 08/02/18 0500 08/03/18 0620 08/04/18 0245  WBC 2.7*  --  2.8* 2.9* 3.0*  --  2.7*  CREATININE 0.69   < > 0.69 0.62 0.57 0.57 0.44   < > = values in this interval not displayed.    Estimated Creatinine Clearance: 102.5 mL/min (by C-G formula based on SCr of 0.44 mg/dL).    No Known Allergies  Antimicrobials this admission: 6/23 Rocephin x 1 6/23 vanc x 1 6/23 cefepime >>  7/2 zosyn >>  Dose adjustments this admission: -  Microbiology results: -  Thank you for allowing pharmacy to be a part of this patient's care.  Nani Skillern Crowford 08/05/2018 2:35 AM

## 2018-08-05 NOTE — Progress Notes (Signed)
[facility]                                                                              Patient Demographics  Emily Hicks, is a 33 y.o. female, DOB - 06-Nov-1985, WUJ:[id number]  Admit date - 07/27/2018   Admitting Physician Burnadette Pop, MD  Outpatient Primary MD for the patient is Sandre Kitty, MD  Outpatient specialists:   LOS - 9  days   Medical records reviewed and are as summarized below:    Chief Complaint  Patient presents with   Fever       Brief summary  Emily Hicks is an 33 y.o. female with medical history significant ofmultiple sclerosis, depression who was brought by her husband to the emergency department for the evaluation of altered mental status, fever, generalized weakness. H/o MS off aubagio a week ago due to intolerance, presents with Confusion, weakness, loss of appetite, fever, sepsis.  Found to have severe oropharyngeal thrush with odynophagia.  Emily Hicks has much improved, odynophagia has resolved.  Remittent fever on 08/03/2018.  Awaiting CIR.    Assessment & Plan    Sepsis, likely due to oropharyngeal candidiasis and esophageal Candida, intermittent fevers - COVID-19 screening negative, Initial w/u negative. Blood cultures negative so far.  Urine culture negative.  Chest x-ray showed no acute infiltrates.  No meningismal signs. - ID following, for now continue Diflucan.  CMV IgM elevated at 240,  CMV PCR is still pending  - HIV/RPR negative, HSV PCR negative, EBV IgG+ but IgM-  - patient continues to spike fevers, 101.2 F and overnight - reviewed recent CT chest, abd and pelvis-> multifocal PNA, peripancreatic fluid, pancreatitis, vs mesenteric adenitis -Started on IV Zosyn on 7/1.  Repeat chest x-ray showed bilateral lower infiltrates versus atelectasis -Repeat LFTs, lipase normal.  No UTI. -Discussed with ID, Dr. Lorenso Courier, will see patient for further recommendations.  Hypernatremia -Sodium 157 at the time of  admission -Likely due to dehydration -Obtain BMET  Hypokalemia, hypomagnesemia -Obtain BMET  Severe hypophosphatemia -Obtain BMET  Sinus tachycardia TSH 0.9, likely due to #1  Acute kidney injury -Presented with creatinine of 1.2 -Improved with IV fluid hydration  Transaminitis -Possible side effect from [place] -Repeat BMET, concerned about CMV acute infection  Normocytic anemia Likely due to anemia of chronic disease  History of multiple sclerosis -Recently stopped aubagio a week prior to admission due to pancytopenia, abnormal liver function -MRI of the brain showed no acute findings. -Per neurology, Dr. Laurence Slate generalized weakness likely pseudo-exacerbation due to acute illness/sepsis  Generalized weakness - MRI of the lumbar spine showed no acute abnormality. - Plan for CIR when afebrile for 24 hours -There was a question of possible some component of conversion disorder versus cognitive deficits related to MS/sepsis.  Per PT, inconsistent with physical presentation, able to ambulate to bathroom then unable to stand, able to move upper extremities and then unable to feed self.  Obesity BMI 37.0   Code Status: full  DVT Prophylaxis:  Lovenox Family Communication: Discussed in detail with the patient, all imaging results, lab results explained to the patient's husband on phone   Disposition Plan:  Not medically ready for CIR yet, still spiking fevers, work-up in progress  Time Spent in minutes : 25 minutes  Procedures:  none   Consultants:   Infectious disease GI   Antimicrobials:   Anti-infectives (From admission, onward)   Start     Dose/Rate Route Frequency Ordered Stop   08/04/18 2200  piperacillin-tazobactam (ZOSYN) IVPB 3.375 g     3.375 g 12.5 mL/hr over 240 Minutes Intravenous Every 8 hours 08/04/18 2156     08/03/18 1000  fluconazole (DIFLUCAN) tablet 200 mg     200 mg Oral Daily 08/02/18 1500     07/28/18 1700  vancomycin (VANCOCIN)  IVPB 750 mg/150 ml premix  Status:  Discontinued     750 mg 150 mL/hr over 60 Minutes Intravenous Every 24 hours 07/27/18 1606 07/28/18 0947   07/28/18 1000  fluconazole (DIFLUCAN) IVPB 200 mg  Status:  Discontinued     200 mg 100 mL/hr over 60 Minutes Intravenous Every 24 hours 07/28/18 0954 08/02/18 1500   07/27/18 1800  ceFEPIme (MAXIPIME) 2 g in sodium chloride 0.9 % 100 mL IVPB  Status:  Discontinued     2 g 200 mL/hr over 30 Minutes Intravenous Every 8 hours 07/27/18 1606 08/01/18 1414   07/27/18 1730  fluconazole (DIFLUCAN) tablet 200 mg  Status:  Discontinued     200 mg Oral  Once 07/27/18 1717 07/28/18 0953   07/27/18 1615  vancomycin (VANCOCIN) 1,500 mg in sodium chloride 0.9 % 500 mL IVPB     1,500 mg 250 mL/hr over 120 Minutes Intravenous  Once 07/27/18 1606 07/27/18 1851   07/27/18 1415  cefTRIAXone (ROCEPHIN) 1 g in sodium chloride 0.9 % 100 mL IVPB     1 g 200 mL/hr over 30 Minutes Intravenous  Once 07/27/18 1404 07/27/18 1642         Medications  Scheduled Meds:  Chlorhexidine Gluconate Cloth  6 each Topical Daily   enoxaparin (LOVENOX) injection  40 mg Subcutaneous Q24H   feeding supplement  1 Container Oral Q24H   feeding supplement (ENSURE ENLIVE)  237 mL Oral Q24H   fluconazole  200 mg Oral Daily   folic acid  2 mg Oral Daily   free water  300 mL Oral Q8H   lidocaine  15 mL Mouth/Throat TID AC & HS   metoprolol tartrate  25 mg Oral BID   multivitamin with minerals  1 tablet Oral Daily   nystatin  5 mL Oral QID   sodium chloride flush  3 mL Intravenous Once   Continuous Infusions:  piperacillin-tazobactam (ZOSYN)  IV 3.375 g (08/05/18 1434)   PRN Meds:.acetaminophen, lip balm, LORazepam, metoprolol tartrate, ondansetron (ZOFRAN) IV, sodium chloride flush      Subjective:   Emily Hicks was seen and examined today.  Still spiking fevers 101.2, feeling gen weakness.  Tachycardiac.  No coughing, dysuria or hematuria.  Patient denies  dizziness, chest pain, shortness of breath, abdominal pain, N/V/D/C.  Objective:   Vitals:   08/05/18 0550 08/05/18 0603 08/05/18 1416 08/05/18 1449  BP: 103/64 119/78 122/83 (!) 120/91  Pulse: (!) 121 (!) 122 (!) 114 (!) 115  Resp: (!) 24 16 (!) 22 18  Temp: 100.3 F (37.9 C) 100.3 F (37.9 C) (!) 101.9 F (38.8 C) (!) 101.2 F (38.4 C)  TempSrc: Oral Oral Oral Oral  SpO2: 96% 99% 97% 95%  Weight:      Height:        Intake/Output Summary (Last 24 hours) at  08/05/2018 1452 Last data filed at 08/05/2018 1200 Gross per 24 hour  Intake 793.56 ml  Output --  Net 793.56 ml     Wt Readings from Last 3 Encounters:  07/31/18 89 kg  07/16/18 72.6 kg  07/06/18 90.7 kg    Physical Exam  General: Alert and oriented x 3, NAD, ill-appearing  Eyes:  HEENT:  Atraumatic, normocephalic  Cardiovascular: S1 S2 clear, no murmurs, RRR. No pedal edema b/l  Respiratory: CTAB, no wheezing, rales or rhonchi  Gastrointestinal: Soft, nontender, nondistended, NBS  Ext: no pedal edema bilaterally  Neuro: no new deficits  Musculoskeletal: No cyanosis, clubbing  Skin: No rashes  Psych: Flat affect alert and oriented x3      Data Reviewed:  I have personally reviewed following labs and imaging studies  Micro Results Recent Results (from the past 240 hour(s))  Blood Culture (routine x 2)     Status: None   Collection Time: 07/27/18  2:04 PM   Specimen: BLOOD  Result Value Ref Range Status   Specimen Description   Final    BLOOD LEFT ANTECUBITAL Performed at [facility] [address] [place], [place] [address]    Special Requests   Final    BOTTLES DRAWN AEROBIC AND ANAEROBIC Blood Culture adequate volume Performed at [facility] [address], [place], [place] [address]    Culture   Final    NO GROWTH 5 DAYS Performed at [facility], [place] [address], [place], [place] [address]    Report Status 08/01/2018 FINAL  Final   Blood Culture (routine x 2)     Status: None   Collection Time: 07/27/18  2:09 PM   Specimen: BLOOD  Result Value Ref Range Status   Specimen Description   Final    BLOOD RIGHT ANTECUBITAL Performed at [facility] [address], [place], [place] [address]    Special Requests   Final    BOTTLES DRAWN AEROBIC AND ANAEROBIC Blood Culture adequate volume Performed at [facility] [address] [place], [place] [address]    Culture   Final    NO GROWTH 5 DAYS Performed at [facility], [place] [address] [place], [place] [address]    Report Status 08/01/2018 FINAL  Final  SARS Coronavirus 2 (CEPHEID - Performed in [facility] hospital lab), Hosp Order     Status: None   Collection Time: 07/27/18  2:34 PM   Specimen: Nasopharyngeal Swab  Result Value Ref Range Status   SARS Coronavirus 2 NEGATIVE NEGATIVE Final    Comment: (NOTE) If result is NEGATIVE SARS-CoV-2 target nucleic acids are NOT DETECTED. The SARS-CoV-2 RNA is generally detectable in upper and lower  respiratory specimens during the acute phase of infection. The lowest  concentration of SARS-CoV-2 viral copies this assay can detect is 250  copies / mL. A negative result does not preclude SARS-CoV-2 infection  and should not be used as the sole basis for treatment or other  patient management decisions.  A negative result may occur with  improper specimen collection / handling, submission of specimen other  than nasopharyngeal swab, presence of viral mutation(s) within the  areas targeted by this assay, and inadequate number of viral copies  (<250 copies / mL). A negative result must be combined with clinical  observations, patient history, and epidemiological information. If result is POSITIVE SARS-CoV-2 target nucleic acids are DETECTED. The SARS-CoV-2 RNA is generally detectable in  upper and lower  respiratory specimens dur ing the acute phase of infection.  Positive   results are indicative of active infection with SARS-CoV-2.  Clinical  correlation with patient history and other diagnostic information is  necessary to determine patient infection status.  Positive results do  not rule out bacterial infection or co-infection with other viruses. If result is PRESUMPTIVE POSTIVE SARS-CoV-2 nucleic acids MAY BE PRESENT.   A presumptive positive result was obtained on the submitted specimen  and confirmed on repeat testing.  While 2019 novel coronavirus  (SARS-CoV-2) nucleic acids may be present in the submitted sample  additional confirmatory testing may be necessary for epidemiological  and / or clinical management purposes  to differentiate between  SARS-CoV-2 and other Sarbecovirus currently known to infect humans.  If clinically indicated additional testing with an alternate test  methodology [phone]) is advised. The SARS-CoV-2 RNA is generally  detectable in upper and lower respiratory sp ecimens during the acute  phase of infection. The expected result is Negative. Fact Sheet for Patients:  BoilerBrush.com.cy Fact Sheet for Healthcare Providers: https://pope.com/ This test is not yet approved or cleared by the [place] FDA and has been authorized for detection and/or diagnosis of SARS-CoV-2 by FDA under an Emergency Use Authorization (EUA).  This EUA will remain in effect (meaning this test can be used) for the duration of the COVID-19 declaration under Section 564(b)(1) of the Act, 21 U.S.C. section 360bbb-3(b)(1), unless the authorization is terminated or revoked sooner. Performed at [facility], [address] [place], [place] [address]   Urine culture     Status: None   Collection Time: 07/27/18  5:01 PM   Specimen: Urine, Catheterized  Result Value Ref Range Status   Specimen Description   Final    URINE, CATHETERIZED Performed at [facility],  [address], [place], [place] [address]    Special Requests   Final    NONE Performed at [facility], [address] [place], [place] [address]    Culture   Final    NO GROWTH Performed at [facility], [address], [place], [place] [address]    Report Status 07/28/2018 FINAL  Final  MRSA PCR Screening     Status: None   Collection Time: 07/27/18  5:39 PM   Specimen: Nasal Mucosa; Nasopharyngeal  Result Value Ref Range Status   MRSA by PCR NEGATIVE NEGATIVE Final    Comment:        The GeneXpert MRSA Assay (FDA approved for NASAL specimens only), is one component of a comprehensive MRSA colonization surveillance program. It is not intended to diagnose MRSA infection nor to guide or monitor treatment for MRSA infections. Performed at [facility], [address] [place], [place] [address]     Radiology Reports Dg Chest 2 View  Result Date: 08/05/2018 CLINICAL DATA:  Fever of unknown origin, multiple sclerosis EXAM: CHEST - 2 VIEW COMPARISON:  07/27/2018 FINDINGS: Borderline enlargement of cardiac silhouette. Mediastinal contours and pulmonary vascularity normal. Bibasilar opacities question atelectasis versus infiltrate. Lungs otherwise clear. No pleural effusion or pneumothorax. Bones unremarkable. IMPRESSION: Bibasilar opacities question atelectasis versus infiltrate. Electronically Signed   By: Ulyses Southward M.D.   On: 08/05/2018 08:27   Dg Chest 2 View  Result Date: 07/27/2018 CLINICAL DATA:  Fever and malaise EXAM: CHEST - 2 VIEW COMPARISON:  July 16, 2018 FINDINGS: Lungs are clear. The heart size and pulmonary vascularity are normal.  No adenopathy. No bone lesions. IMPRESSION: No edema or consolidation. Electronically Signed   By: Bretta BangWilliam  Woodruff III M.D.   On: 07/27/2018 15:00   Ct Chest W Contrast  Result Date: 08/02/2018 CLINICAL DATA:  Fever of unknown origin. EXAM: CT CHEST, ABDOMEN, AND PELVIS WITH CONTRAST  TECHNIQUE: Multidetector CT imaging of the chest, abdomen and pelvis was performed following the standard protocol during bolus administration of intravenous contrast. CONTRAST:  100mL OMNIPAQUE IOHEXOL 300 MG/ML SOLN, 30mL OMNIPAQUE IOHEXOL 300 MG/ML SOLN COMPARISON:  None. FINDINGS: CT CHEST FINDINGS Cardiovascular: There is no large centrally located pulmonary embolus. Detection of smaller segmental and subsegmental pulmonary emboli is limited by contrast bolus timing. The main pulmonary artery is within normal limits for size. There is no CT evidence of acute right heart strain. The visualized aorta is normal. Heart size is normal. There is a trace pericardial effusion. Mediastinum/Nodes: --there are mildly prominent mediastinal and hilar lymph nodes bilaterally. --there is mild bilateral axillary adenopathy. --No supraclavicular lymphadenopathy. --Normal thyroid gland. --The esophagus is unremarkable Lungs/Pleura: There are trace bilateral pleural effusions with adjacent atelectasis. There is scattered ground-glass airspace opacities bilaterally, greatest within the right upper lobe. There is no pneumothorax. The trachea is unremarkable. Musculoskeletal: No chest wall abnormality. No acute or significant osseous findings. 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 CT ABDOMEN PELVIS FINDINGS Hepatobiliary: The liver is borderline enlarged. Status post cholecystectomy.There is no biliary ductal dilation. Pancreas: There is some mild peripancreatic fat stranding. Spleen: There is a small wedge-shaped defect in the lower pole the spleen measuring approximately 1.8 cm (axial series 2 image 47). Adrenals/Urinary Tract: --Adrenal glands: No adrenal hemorrhage. --Right kidney/ureter: No hydronephrosis or perinephric hematoma. --Left kidney/ureter: No hydronephrosis or perinephric hematoma. --Urinary bladder: Unremarkable. Stomach/Bowel: --Stomach/Duodenum: No hiatal hernia or other gastric  abnormality. Normal duodenal course and caliber. --Small bowel: No dilatation or inflammation. --Colon: No focal abnormality. --Appendix: Normal. Vascular/Lymphatic: Normal course and caliber of the major abdominal vessels. There is a retroaortic left renal vein, a normal variant. --there are prominent retroperitoneal lymph nodes. --there are prominent mesenteric lymph nodes. There is haziness throughout the mesentery. There are enlarged right lower quadrant lymph nodes measuring approximately 1.1 cm in diameter. --there are mildly enlarged pelvic and inguinal lymph nodes. Reproductive: There is a small amount of free fluid in the pelvis which may be physiologic. There is no discrete ovarian mass. Other: There is a small volume of pelvic free fluid which is likely physiologic. No free air. There is some mild fat stranding involving the anterior abdominal wall bilaterally. Musculoskeletal. No acute displaced fractures. IMPRESSION: 1. Scattered ground-glass airspace opacities, greatest within the right upper lobe, concerning for multifocal pneumonia. 2. Small bilateral pleural effusions with adjacent atelectasis. 3. Mild diffuse adenopathy throughout the chest, abdomen, and pelvis of unknown clinical significance. 4. Peripancreatic free fluid with edema within the root of the mesentery. Findings are nonspecific and can be seen in patients with pancreatitis, versus less likely sclerosing mesenteritis or inflammatory bowel disease. There are some prominent lymph nodes right lower quadrant which can be seen in patients with mesenteric adenitis. 5. Normal appendix in the right lower quadrant. 6. Small volume free fluid in the pelvis favored to be physiologic. 7. Wedge-shaped hypoattenuating defect in the spleen of unknown clinical significance. Differential considerations include a splenic infarct versus less likely a splenic laceration, especially in the absence of a history of trauma. There is no perisplenic hematoma.  Electronically Signed   By: Cristal Deerhristopher  Choi M.D.   On: 08/02/2018 00:18   Mr Brain Wo Contrast  Result Date: 07/28/2018 CLINICAL DATA:  Encephalopathy with hallucinations EXAM: MRI HEAD WITHOUT CONTRAST TECHNIQUE: Multiplanar, multiecho pulse sequences of the brain and surrounding structures were obtained without intravenous contrast. COMPARISON:  Brain MRI 03/24/2018 FINDINGS: BRAIN: There is no acute infarct, acute hemorrhage or extra-axial collection. The midline structures are normal. The white matter signal is normal for the patient's age. The cerebral and cerebellar volume are age-appropriate. No hydrocephalus. Susceptibility-sensitive sequences show no chronic microhemorrhage or superficial siderosis. No midline shift or other mass effect. VASCULAR: The major intracranial arterial and venous sinus flow voids are normal. SKULL AND UPPER CERVICAL SPINE: Calvarial bone marrow signal is normal. There is no skull base mass. Visualized upper cervical spine and soft tissues are normal. SINUSES/ORBITS: No fluid levels or advanced mucosal thickening. No mastoid or middle ear effusion. The orbits are normal. IMPRESSION: Normal brain. Electronically Signed   By: Deatra RobinsonKevin  Herman M.D.   On: 07/28/2018 00:13   Mr Lumbar Spine Wo Contrast  Result Date: 08/02/2018 CLINICAL DATA:  Fever. Lower extremity weakness. History of multiple sclerosis. EXAM: MRI LUMBAR SPINE WITHOUT CONTRAST TECHNIQUE: Multiplanar, multisequence MR imaging of the lumbar spine was performed. No intravenous contrast was administered. COMPARISON:  CT abdomen pelvis from yesterday. FINDINGS: Despite efforts by the technologist and patient, motion artifact is present on today's exam and could not be eliminated. This reduces exam sensitivity and specificity. Segmentation:  Standard. Alignment:  Physiologic. Vertebrae:  No fracture, evidence of discitis, or bone lesion. Conus medullaris and cauda equina: Conus extends to the L1-L2 level. Conus and  cauda equina appear normal. Paraspinal and other soft tissues: Symmetric bilateral paraspinous muscle edema. Otherwise negative. Disc levels: No significant disc bulge or herniation. No spinal canal or neuroforaminal stenosis. IMPRESSION: 1.  No acute abnormality or significant degenerative changes. Electronically Signed   By: Obie DredgeWilliam T Derry M.D.   On: 08/02/2018 09:14   Ct Abdomen Pelvis W Contrast  Result Date: 08/02/2018 CLINICAL DATA:  Fever of unknown origin. EXAM: CT CHEST, ABDOMEN, AND PELVIS WITH CONTRAST TECHNIQUE: Multidetector CT imaging of the chest, abdomen and pelvis was performed following the standard protocol during bolus administration of intravenous contrast. CONTRAST:  100mL OMNIPAQUE IOHEXOL 300 MG/ML SOLN, 30mL OMNIPAQUE IOHEXOL 300 MG/ML SOLN COMPARISON:  None. FINDINGS: CT CHEST FINDINGS Cardiovascular: There is no large centrally located pulmonary embolus. Detection of smaller segmental and subsegmental pulmonary emboli is limited by contrast bolus timing. The main pulmonary artery is within normal limits for size. There is no CT evidence of acute right heart strain. The visualized aorta is normal. Heart size is normal. There is a trace pericardial effusion. Mediastinum/Nodes: --there are mildly prominent mediastinal and hilar lymph nodes bilaterally. --there is mild bilateral axillary adenopathy. --No supraclavicular lymphadenopathy. --Normal thyroid gland. --The esophagus is unremarkable Lungs/Pleura: There are trace bilateral pleural effusions with adjacent atelectasis. There is scattered ground-glass airspace opacities bilaterally, greatest within the right upper lobe. There is no pneumothorax. The trachea is unremarkable. Musculoskeletal: No chest wall abnormality. No acute or significant osseous findings. 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 CT ABDOMEN PELVIS FINDINGS Hepatobiliary: The liver is borderline enlarged. Status post  cholecystectomy.There is no biliary ductal dilation. Pancreas: There is some mild peripancreatic fat stranding. Spleen: There is a small wedge-shaped defect in the lower pole the spleen measuring approximately 1.8 cm (axial series 2 image 47). Adrenals/Urinary Tract: --Adrenal glands: No adrenal hemorrhage. --Right kidney/ureter:  No hydronephrosis or perinephric hematoma. --Left kidney/ureter: No hydronephrosis or perinephric hematoma. --Urinary bladder: Unremarkable. Stomach/Bowel: --Stomach/Duodenum: No hiatal hernia or other gastric abnormality. Normal duodenal course and caliber. --Small bowel: No dilatation or inflammation. --Colon: No focal abnormality. --Appendix: Normal. Vascular/Lymphatic: Normal course and caliber of the major abdominal vessels. There is a retroaortic left renal vein, a normal variant. --there are prominent retroperitoneal lymph nodes. --there are prominent mesenteric lymph nodes. There is haziness throughout the mesentery. There are enlarged right lower quadrant lymph nodes measuring approximately 1.1 cm in diameter. --there are mildly enlarged pelvic and inguinal lymph nodes. Reproductive: There is a small amount of free fluid in the pelvis which may be physiologic. There is no discrete ovarian mass. Other: There is a small volume of pelvic free fluid which is likely physiologic. No free air. There is some mild fat stranding involving the anterior abdominal wall bilaterally. Musculoskeletal. No acute displaced fractures. IMPRESSION: 1. Scattered ground-glass airspace opacities, greatest within the right upper lobe, concerning for multifocal pneumonia. 2. Small bilateral pleural effusions with adjacent atelectasis. 3. Mild diffuse adenopathy throughout the chest, abdomen, and pelvis of unknown clinical significance. 4. Peripancreatic free fluid with edema within the root of the mesentery. Findings are nonspecific and can be seen in patients with pancreatitis, versus less likely sclerosing  mesenteritis or inflammatory bowel disease. There are some prominent lymph nodes right lower quadrant which can be seen in patients with mesenteric adenitis. 5. Normal appendix in the right lower quadrant. 6. Small volume free fluid in the pelvis favored to be physiologic. 7. Wedge-shaped hypoattenuating defect in the spleen of unknown clinical significance. Differential considerations include a splenic infarct versus less likely a splenic laceration, especially in the absence of a history of trauma. There is no perisplenic hematoma. Electronically Signed   By: Katherine Mantlehristopher  Gul M.D.   On: 08/02/2018 00:18   Dg Chest Port 1 View  Result Date: 07/16/2018 CLINICAL DATA:  Fatigue EXAM: PORTABLE CHEST 1 VIEW COMPARISON:  None. FINDINGS: The heart size and mediastinal contours are within normal limits. Both lungs are clear. The visualized skeletal structures are unremarkable. IMPRESSION: No active disease. Electronically Signed   By: Alcide CleverMark  Lukens M.D.   On: 07/16/2018 15:18    Lab Data:  CBC: Recent Labs  Lab 07/30/18 0605 07/31/18 0221 08/01/18 0255 08/01/18 1021 08/01/18 1930 08/02/18 0500 08/04/18 0245  WBC 2.7* 2.8* 2.9*  --   --  3.0* 2.7*  NEUTROABS 2.2 2.0 2.1  --   --  2.2  --   HGB 7.2* 7.2* 6.5* 6.8* 8.5* 8.1* 7.8*  HCT 23.0* 22.8* 20.5* 22.2* 26.2* 25.2* 25.2*  MCV 86.8 83.5 84.4  --   --  86.3 87.5  PLT 172 151 145*  --   --  151 134*   Basic Metabolic Panel: Recent Labs  Lab 07/31/18 0221 08/01/18 0255 08/02/18 0500 08/03/18 0620 08/04/18 0245  NA 144 143 142 142 140  K 3.4* 3.5 2.9* 3.5 3.3*  CL 121* 120* 118* 118* 117*  CO2 17* 17* 17* 18* 19*  GLUCOSE 99 90 83 90 103*  BUN 10 10 8 7 7   CREATININE 0.69 0.62 0.57 0.57 0.44  CALCIUM 7.2* 7.1* 7.1* 7.3* 6.9*  MG 1.7 1.7 1.8 1.7 1.7  PHOS  --   --   --  <1.0* 1.4*   GFR: Estimated Creatinine Clearance: 102.5 mL/min (by C-G formula based on SCr of 0.44 mg/dL). Liver Function Tests: Recent Labs  Lab  07/30/18 1320 07/31/18  0221 08/01/18 0255 08/02/18 0500 08/03/18 0620  AST 83* 86* 98* 85*  --   ALT 31 33 36 33  --   ALKPHOS 47 50 63 74  --   BILITOT 0.4 0.2* 0.3 0.3  --   PROT 5.6* 5.5* 5.4* 5.6*  --   ALBUMIN 1.8* 1.7* 1.7* 1.8* 1.8*   Recent Labs  Lab 08/04/18 2141  LIPASE 45   No results for input(s): AMMONIA in the last 168 hours. Coagulation Profile: Recent Labs  Lab 08/01/18 1021  INR 1.4*   Cardiac Enzymes: No results for input(s): CKTOTAL, CKMB, CKMBINDEX, TROPONINI in the last 168 hours. BNP (last 3 results) No results for input(s): PROBNP in the last 8760 hours. HbA1C: No results for input(s): HGBA1C in the last 72 hours. CBG: Recent Labs  Lab 07/31/18 1154 07/31/18 1630  GLUCAP 94 85   Lipid Profile: No results for input(s): CHOL, HDL, LDLCALC, TRIG, CHOLHDL, LDLDIRECT in the last 72 hours. Thyroid Function Tests: No results for input(s): TSH, T4TOTAL, FREET4, T3FREE, THYROIDAB in the last 72 hours. Anemia Panel: No results for input(s): VITAMINB12, FOLATE, FERRITIN, TIBC, IRON, RETICCTPCT in the last 72 hours. Urine analysis:    Component Value Date/Time   COLORURINE YELLOW 08/04/2018 2135   APPEARANCEUR HAZY (A) 08/04/2018 2135   LABSPEC 1.018 08/04/2018 2135   PHURINE 6.0 08/04/2018 2135   GLUCOSEU NEGATIVE 08/04/2018 2135   HGBUR NEGATIVE 08/04/2018 2135   BILIRUBINUR NEGATIVE 08/04/2018 2135   BILIRUBINUR Neg 05/08/2010 1553   KETONESUR NEGATIVE 08/04/2018 2135   PROTEINUR 100 (A) 08/04/2018 2135   UROBILINOGEN 1.0 11/11/2013 0150   NITRITE NEGATIVE 08/04/2018 2135   LEUKOCYTESUR NEGATIVE 08/04/2018 2135     Ishmeal Rorie M.D. [facility] 08/05/2018, 2:52 PM  Pager: [phone] Between 7am to 7pm - call Pager - [phone]  After 7pm go to [email or web address] - password [email or web address]  Call night coverage person covering after 7pm

## 2018-08-05 NOTE — Progress Notes (Signed)
Patient ID: Emily Hicks, female   DOB: 1986-01-30, 33 y.o.   MRN: [id number]         [facility]  Date of Admission:  07/27/2018           Day 9 fluconazole        Day 1 zosyn ASSESSMENT: Her dysphagia and odynophagia have resolved on therapy for probable Candida esophagitis.  However, she continues to have intermittent fevers.  She has had elevated liver enzymes for several months.  CMV IgM antibody is elevated but not absolutely certain that this explains her acute febrile illness.  She is s/p 6 days of cefepime with persistent near daily fevers, so doubtful pt has bacterial PNA as cause of her fever. She is currently on RA and not dyspneic. If infectious cause of fever is present, suspect viral or fungal cause at this time.  PLAN: 1. Continue fluconazole for now but lower dose to 100 mg daily to lower risk of drug fever. 2. F/u CMV DNA viral load as CMV IgM was positive on this admission. Unclear if this would be commensal or causative infection though, so would not add empiric valcyte. If fever persists, would ask GI to reconsider EGD +/- colonoscopy to R/O CMV gastrointestinal infection. 3. D/c zosyn as no clear indication for broad ABX exists. Given underlying Candidal esophagitis, any empiric broad spectrum ABX are likely to lead to return of thrush/fungal infections, so would avoid at this time. 4. Check Legionella urine Ag and Mycoplasma serologies given atypical appearance on chest CT. Pt is currently NOT dyspneic though, so again would avoid empiric antibacterials at this time as normal procalcitonins and lack of leukocytosis do NOT favor bacterial infection at this time. 5. Repeat blood cxs x 2 now and daily if the patient continues to have fever > 100.5. 6. Echocardiogram on 08/02/2018 showed no evidence of endocarditis.  Principal Problem:   Severe sepsis (HCC) Active Problems:   MS (multiple sclerosis) (HCC)   Tachycardia   Leukopenia   AKI (acute  kidney injury) (HCC)   Hypernatremia   Fever   AMS (altered mental status)   Acute metabolic encephalopathy   Hypokalemia   Candida esophagitis (HCC)   Hypomagnesemia   Normocytic anemia   Pressure injury of skin   Elevated liver enzymes   Scheduled Meds: . Chlorhexidine Gluconate Cloth  6 each Topical Daily  . enoxaparin (LOVENOX) injection  40 mg Subcutaneous Q24H  . feeding supplement  1 Container Oral Q24H  . feeding supplement (ENSURE ENLIVE)  237 mL Oral Q24H  . fluconazole  200 mg Oral Daily  . folic acid  2 mg Oral Daily  . free water  300 mL Oral Q8H  . lidocaine  15 mL Mouth/Throat TID AC & HS  . metoprolol tartrate  25 mg Oral BID  . multivitamin with minerals  1 tablet Oral Daily  . nystatin  5 mL Oral QID  . sodium chloride flush  3 mL Intravenous Once   Continuous Infusions: . piperacillin-tazobactam (ZOSYN)  IV 3.375 g (08/05/18 1434)   PRN Meds:.acetaminophen, lip balm, LORazepam, metoprolol tartrate, ondansetron (ZOFRAN) IV, sodium chloride flush   SUBJECTIVE: Stood with PT today using walker but OTW no ambulation per nurse. Appetite remains poor by pt's food preference (states she would love to have a subway sandwich) but denies any further odynophagia. No BM today thus far but had 3 yesterday.   Review of Systems: Review of Systems  Constitutional: Positive for  fever. Negative for chills and diaphoresis.  HENT:       As noted in HPI.  Respiratory: Negative for cough, sputum production and shortness of breath.   Cardiovascular: Positive for palpitations. Negative for chest pain.  Gastrointestinal: Negative for abdominal pain, diarrhea, nausea and vomiting.  Genitourinary: Negative for dysuria.  All other systems reviewed and are negative. No odynophagia/dysphagia at present.  No Known Allergies  OBJECTIVE: Vitals:   08/05/18 1416 08/05/18 1449 08/05/18 1549 08/05/18 1612  BP: 122/83 (!) 120/91 113/70 120/73  Pulse: (!) 114 (!) 115 (!) 118 (!)  118  Resp: (!) 22 18 (!) 23 (!) 24  Temp: (!) 101.9 F (38.8 C) (!) 101.2 F (38.4 C) 99.4 F (37.4 C) 98.2 F (36.8 C)  TempSrc: Oral Oral Oral Oral  SpO2: 97% 95% 96% 97%  Weight:      Height:       Body mass index is 37.07 kg/m. Physical Exam Gen: pleasant, NAD, A&Ox 3 Head: NCAT, no temporal wasting evident EENT: PERRL, EOMI, dry MM, no thrush at present but angular chelitis evident, adequate dentition Neck: supple, no JVD CV: tachycardic rate, RR, no murmurs evident Pulm: CTA bilaterally, mild wheeze throughout, rare retractions Abd: soft, NTND, +BS Extrems: 1+ LE edema, 2+ pulses Skin: no rashes, adequate skin turgor Neuro: CN II-XII grossly intact, no focal neurologic deficits appreciated, gait was not assessed, A&Ox 3  Lab Results Lab Results  Component Value Date   WBC 2.8 (L) 08/05/2018   HGB 8.6 (L) 08/05/2018   HCT 27.7 (L) 08/05/2018   MCV 90.8 08/05/2018   PLT 154 08/05/2018    Lab Results  Component Value Date   CREATININE 0.44 08/04/2018   BUN 7 08/04/2018   NA 140 08/04/2018   K 3.3 (L) 08/04/2018   CL 117 (H) 08/04/2018   CO2 19 (L) 08/04/2018    Lab Results  Component Value Date   ALT 33 08/02/2018   AST 85 (H) 08/02/2018   ALKPHOS 74 08/02/2018   BILITOT 0.3 08/02/2018     Microbiology: Recent Results (from the past 240 hour(s))  Blood Culture (routine x 2)     Status: None   Collection Time: 07/27/18  2:04 PM   Specimen: BLOOD  Result Value Ref Range Status   Specimen Description   Final    BLOOD LEFT ANTECUBITAL Performed at [facility], [address] [place], [place] [address]    Special Requests   Final    BOTTLES DRAWN AEROBIC AND ANAEROBIC Blood Culture adequate volume Performed at [facility], [address] [place], [place] [address]    Culture   Final    NO GROWTH 5 DAYS Performed at [facility], [address] [place], [place] [address]    Report Status 08/01/2018  FINAL  Final  Blood Culture (routine x 2)     Status: None   Collection Time: 07/27/18  2:09 PM   Specimen: BLOOD  Result Value Ref Range Status   Specimen Description   Final    BLOOD RIGHT ANTECUBITAL Performed at [facility], [address] [place], [place] [address]    Special Requests   Final    BOTTLES DRAWN AEROBIC AND ANAEROBIC Blood Culture adequate volume Performed at [facility], [address] [place], [place] [address]    Culture   Final    NO GROWTH 5 DAYS Performed at [facility], [address] [place], [place] [address]    Report Status 08/01/2018 FINAL  Final  SARS Coronavirus 2 (CEPHEID - Performed in [place] hospital lab), Hosp Order     Status: None   Collection Time: 07/27/18  2:34 PM   Specimen: Nasopharyngeal Swab  Result Value Ref Range Status   SARS Coronavirus 2 NEGATIVE NEGATIVE Final    Comment: (NOTE) If result is NEGATIVE SARS-CoV-2 target nucleic acids are NOT DETECTED. The SARS-CoV-2 RNA is generally detectable in upper and lower  respiratory specimens during the acute phase of infection. The lowest  concentration of SARS-CoV-2 viral copies this assay can detect is 250  copies / mL. A negative result does not preclude SARS-CoV-2 infection  and should not be used as the sole basis for treatment or other  patient management decisions.  A negative result may occur with  improper specimen collection / handling, submission of specimen other  than nasopharyngeal swab, presence of viral mutation(s) within the  areas targeted by this assay, and inadequate number of viral copies  (<250 copies / mL). A negative result must be combined with clinical  observations, patient history, and epidemiological information. If result is POSITIVE SARS-CoV-2 target nucleic acids are DETECTED. The SARS-CoV-2 RNA is generally detectable in upper and lower  respiratory specimens dur ing the acute phase of infection.   Positive  results are indicative of active infection with SARS-CoV-2.  Clinical  correlation with patient history and other diagnostic information is  necessary to determine patient infection status.  Positive results do  not rule out bacterial infection or co-infection with other viruses. If result is PRESUMPTIVE POSTIVE SARS-CoV-2 nucleic acids MAY BE PRESENT.   A presumptive positive result was obtained on the submitted specimen  and confirmed on repeat testing.  While 2019 novel coronavirus  (SARS-CoV-2) nucleic acids may be present in the submitted sample  additional confirmatory testing may be necessary for epidemiological  and / or clinical management purposes  to differentiate between  SARS-CoV-2 and other Sarbecovirus currently known to infect humans.  If clinically indicated additional testing with an alternate test  methodology [id number]) is advised. The SARS-CoV-2 RNA is generally  detectable in upper and lower respiratory sp ecimens during the acute  phase of infection. The expected result is Negative. Fact Sheet for Patients:  StrictlyIdeas.no Fact Sheet for Healthcare Providers: BankingDealers.co.za This test is not yet approved or cleared by the [place] FDA and has been authorized for detection and/or diagnosis of SARS-CoV-2 by FDA under an Emergency Use Authorization (EUA).  This EUA will remain in effect (meaning this test can be used) for the duration of the COVID-19 declaration under Section 564(b)(1) of the Act, 21 U.S.C. section 360bbb-3(b)(1), unless the authorization is terminated or revoked sooner. Performed at [facility], [place] [address], [place], [place] [address]   Urine culture     Status: None   Collection Time: 07/27/18  5:01 PM   Specimen: Urine, Catheterized  Result Value Ref Range Status   Specimen Description   Final    URINE, CATHETERIZED Performed at [place] [address], [place], [place] [address]    Special Requests   Final    NONE Performed at [facility], [place] [address] [place], [place] [address]    Culture   Final    NO GROWTH Performed at [facility], [place] [address] [place], [place] [address]    Report Status 07/28/2018 FINAL  Final  MRSA  PCR Screening     Status: None   Collection Time: 07/27/18  5:39 PM   Specimen: Nasal Mucosa; Nasopharyngeal  Result Value Ref Range Status   MRSA by PCR NEGATIVE NEGATIVE Final    Comment:        The GeneXpert MRSA Assay (FDA approved for NASAL specimens only), is one component of a comprehensive MRSA colonization surveillance program. It is not intended to diagnose MRSA infection nor to guide or monitor treatment for MRSA infections. Performed at [facility], [address] [place], [place] [address]     [place] LionsJames N Danta Baumgardner, MD [facility] [phone] pager   [phone] cell 08/05/2018, 4:28 PM

## 2018-08-05 NOTE — Progress Notes (Signed)
Physical Therapy Treatment Patient Details Name: Emily Hicks MRN: [id number] DOB: August 15, 1985 Today's Date: 08/05/2018    History of Present Illness This 33 year old female was admitted for progressive weakness and AMS.  She was recently dxd with MS    PT Comments    Pt agreeable to mobilize and assisted to sitting EOB.  Pt then requested to use BSC.  Pt requiring mod assist for transfers at this time.  Pt then performed a few exercises sitting EOB until fatigued.  Continue to recommend CIR upon d/c.   Follow Up Recommendations  CIR     Equipment Recommendations  Other (comment)(defer)    Recommendations for Other Services       Precautions / Restrictions Precautions Precautions: Fall Precaution Comments: monitor HR Restrictions Weight Bearing Restrictions: No  HR 124 bpm with activity    Mobility  Bed Mobility Overal bed mobility: Needs Assistance Bed Mobility: Rolling;Sidelying to Sit;Sit to Sidelying   Sidelying to sit: +2 for physical assistance;Mod assist;+2 for safety/equipment     Sit to sidelying: Mod assist General bed mobility comments: requiring multi-modal cues for technique and self assist  Transfers Overall transfer level: Needs assistance Equipment used: Rolling walker (2 wheeled) Transfers: Sit to/from [employer];Stand Pivot Transfers Sit to Stand: Mod assist;+2 safety/equipment Stand pivot transfers: +2 safety/equipment;Min assist       General transfer comment: multimodal cues for technique, more assist for rise from [facility] then bed even at same height, assisted to/from [facility]; pt unable to perform pericare even though she did attempt in sitting  Ambulation/Gait                 Stairs             Wheelchair Mobility    Modified Rankin (Stroke Patients Only)       Balance                                            Cognition Arousal/Alertness: Awake/alert Behavior During Therapy: Flat affect Overall Cognitive  Status: No family/caregiver present to determine baseline cognitive functioning Area of Impairment: Attention;Memory;Following commands;Safety/judgement;Problem solving                   Current Attention Level: Sustained   Following Commands: Follows one step commands inconsistently Safety/Judgement: Decreased awareness of safety;Decreased awareness of deficits   Problem Solving: Slow processing;Decreased initiation;Difficulty sequencing;Requires verbal cues;Requires tactile cues General Comments: pt with inconsistent presentation,  moves extremities spontaneously at times then unable to move when cued      Exercises General Exercises - Lower Extremity Ankle Circles/Pumps: AROM;10 reps;Seated Long Arc Quad: AROM;Both;5 reps;Seated Hip Flexion/Marching: AROM;5 reps;Both    General Comments        Pertinent Vitals/Pain Pain Assessment: No/denies pain    Home Living                      Prior Function            PT Goals (current goals can now be found in the care plan section) Progress towards PT goals: Progressing toward goals    Frequency    Min 4X/week      PT Plan Current plan remains appropriate    Co-evaluation              AM-PAC PT "6 Clicks" Mobility   Outcome Measure  Help needed turning from your back to your side while in a flat bed without using bedrails?: A Lot Help needed moving from lying on your back to sitting on the side of a flat bed without using bedrails?: A Lot Help needed moving to and from a bed to a chair (including a wheelchair)?: A Lot Help needed standing up from a chair using your arms (e.g., wheelchair or bedside chair)?: A Lot Help needed to walk in hospital room?: Total Help needed climbing 3-5 steps with a railing? : Total 6 Click Score: 10    End of Session Equipment Utilized During Treatment: Gait belt Activity Tolerance: Patient tolerated treatment well;Patient limited by fatigue Patient left: in  bed;with call bell/phone within reach;with bed alarm set Nurse Communication: Mobility status PT Visit Diagnosis: Other abnormalities of gait and mobility (R26.89);Difficulty in walking, not elsewhere classified (R26.2)     Time: 4128-7867 PT Time Calculation (min) (ACUTE ONLY): 24 min  Charges:  $Therapeutic Activity: 23-37 mins                     Carmelia Bake, PT, DPT Acute Rehabilitation Services Office: [phone] Pager: [phone]  Trena Platt 08/05/2018, 12:53 PM

## 2018-08-05 NOTE — Progress Notes (Signed)
Inpatient Rehab Admissions Coordinator:   Note pt with fever up to 101 on 7/1 and sustained tacchy 120s at rest.  Continue to follow for resolution of fever and medical stability for timing of CIR admission.   Shann Medal, PT, DPT Admissions Coordinator [phone] 08/05/18  10:42 AM

## 2018-08-06 ENCOUNTER — Inpatient Hospital Stay (HOSPITAL_COMMUNITY): Payer: 59

## 2018-08-06 DIAGNOSIS — R509 Fever, unspecified: Secondary | ICD-10-CM

## 2018-08-06 LAB — BLOOD CULTURE ID PANEL (REFLEXED)

## 2018-08-06 LAB — CBC
HCT: 27.4 % — ABNORMAL LOW (ref 36.0–46.0)
Hemoglobin: 8.4 g/dL — ABNORMAL LOW (ref 12.0–15.0)
MCH: 27.3 pg (ref 26.0–34.0)
MCHC: 30.7 g/dL (ref 30.0–36.0)
MCV: 89 fL (ref 80.0–100.0)
Platelets: 166 10*3/uL (ref 150–400)
RBC: 3.08 MIL/uL — ABNORMAL LOW (ref 3.87–5.11)
RDW: 19.4 % — ABNORMAL HIGH (ref 11.5–15.5)
WBC: 2.9 10*3/uL — ABNORMAL LOW (ref 4.0–10.5)
nRBC: 0.7 % — ABNORMAL HIGH (ref 0.0–0.2)

## 2018-08-06 LAB — BASIC METABOLIC PANEL
Anion gap: 7 (ref 5–15)
BUN: 6 mg/dL (ref 6–20)
CO2: 17 mmol/L — ABNORMAL LOW (ref 22–32)
Calcium: 7 mg/dL — ABNORMAL LOW (ref 8.9–10.3)
Chloride: 116 mmol/L — ABNORMAL HIGH (ref 98–111)
Creatinine, Ser: 0.51 mg/dL (ref 0.44–1.00)
GFR calc Af Amer: >60 mL/min (ref >60)
GFR calc non Af Amer: >60 mL/min (ref >60)
Glucose, Bld: 97 mg/dL (ref 70–99)
Potassium: 3.2 mmol/L — ABNORMAL LOW (ref 3.5–5.1)
Sodium: 140 mmol/L (ref 135–145)

## 2018-08-06 LAB — URINE CULTURE: Culture: NO GROWTH

## 2018-08-06 LAB — LEGIONELLA PNEUMOPHILA SEROGP 1 UR AG: L. pneumophila Serogp 1 Ur Ag: NEGATIVE

## 2018-08-06 LAB — PHOSPHORUS: Phosphorus: <1 mg/dL — CL (ref 2.5–4.6)

## 2018-08-06 LAB — MAGNESIUM: Magnesium: 1.5 mg/dL — ABNORMAL LOW (ref 1.7–2.4)

## 2018-08-06 MED ORDER — POTASSIUM CHLORIDE CRYS ER 20 MEQ PO TBCR
40.0000 meq | EXTENDED_RELEASE_TABLET | Freq: Once | ORAL | Status: DC
  Filled 2018-08-06: qty 2

## 2018-08-06 MED ORDER — POTASSIUM PHOSPHATES 15 MMOLE/5ML IV SOLN
30.0000 mmol | Freq: Once | INTRAVENOUS | Status: AC
  Administered 2018-08-06: 30 mmol via INTRAVENOUS
  Filled 2018-08-06: qty 10

## 2018-08-06 MED ORDER — MAGNESIUM OXIDE 400 (241.3 MG) MG PO TABS
400.0000 mg | ORAL_TABLET | Freq: Two times a day (BID) | ORAL | Status: DC
  Administered 2018-08-06 – 2018-08-16 (×8): 400 mg via ORAL
  Filled 2018-08-06 (×20): qty 1

## 2018-08-06 MED ORDER — K PHOS MONO-SOD PHOS DI & MONO 155-852-130 MG PO TABS
500.0000 mg | ORAL_TABLET | Freq: Three times a day (TID) | ORAL | Status: DC
  Administered 2018-08-06 – 2018-08-16 (×8): 500 mg via ORAL
  Filled 2018-08-06 (×33): qty 2

## 2018-08-06 MED ORDER — LOPERAMIDE HCL 2 MG PO CAPS
2.0000 mg | ORAL_CAPSULE | Freq: Once | ORAL | Status: AC
  Administered 2018-08-06: 2 mg via ORAL
  Filled 2018-08-06: qty 1

## 2018-08-06 NOTE — Progress Notes (Addendum)
[facility]                                                                              Patient Demographics  Emily Hicks, is a 33 y.o. female, DOB - 01-10-1986, GNF:[id number]  Admit date - 07/27/2018   Admitting Physician Shelly Coss, MD  Outpatient Primary MD for the patient is [name], MD  Outpatient specialists:   LOS - 10  days   Medical records reviewed and are as summarized below:    Chief Complaint  Patient presents with   Fever       Brief summary  Emily Hicks is an 33 y.o. female with medical history significant ofmultiple sclerosis, depression who was brought by her husband to the emergency department for the evaluation of altered mental status, fever, generalized weakness. H/o MS off aubagio a week ago due to intolerance, presents with Confusion, weakness, loss of appetite, fever, sepsis.  Found to have severe oropharyngeal thrush with odynophagia.  Emily Hicks has much improved, odynophagia has resolved.  Remittent fever on 08/03/2018.  Awaiting CIR.    Assessment & Plan    Sepsis, likely due to oropharyngeal candidiasis and esophageal Candida, intermittent fevers - COVID-19 screening negative, Initial w/u negative. Blood cultures negative so far.  Urine culture negative.  Chest x-ray showed no acute infiltrates.  No meningismal signs. - CMV IgM elevated at 240, no CMV DNA detected - HIV/RPR negative, HSV PCR negative, EBV IgG+ but IgM-  - reviewed recent CT chest, abd and pelvis-> multifocal PNA, peripancreatic fluid, pancreatitis, vs mesenteric adenitis -Started on IV Zosyn on 7/1.  Repeat chest x-ray showed bilateral lower infiltrates versus atelectasis -Repeat LFTs, lipase normal.  No UTI. -Follow mycoplasma serologies, check urine Legionella antigen.  No UTI. -2D echo 6/29 showed no endocarditis -ID following, decreased dose of Diflucan to 100 mg daily - BCID 1/2 + for MRSA, Contaminant?  ID following, will await  recommendations.  Hypernatremia -Sodium 157 at the time of admission -Likely due to dehydration -Sodium improving, 140  Hypokalemia, hypomagnesemia -Potassium 3.2, replaced,  - magnesium replaced, placed on supplement  Severe hypophosphatemia -Phosphorus less than 1.0, will give K-Phos IV and placed on nutritional supplement  Sinus tachycardia TSH 0.9, likely due to #1  Acute kidney injury -Presented with creatinine of 1.2 -Improved with IV fluid hydration  Transaminitis -Possible side effect from aubagio -AST improving  Normocytic anemia Likely due to anemia of chronic disease -H&H currently stable  History of multiple sclerosis -Recently stopped aubagio a week prior to admission due to pancytopenia, abnormal liver function -MRI of the brain showed no acute findings. -Per neurology, Dr. Lorraine Lax generalized weakness likely pseudo-exacerbation due to acute illness/sepsis  Generalized weakness - MRI of the lumbar spine showed no acute abnormality. - Plan for CIR when afebrile for 24 hours -There was a question of possible some component of conversion disorder versus cognitive deficits related to MS/sepsis.  Per PT, inconsistent with physical presentation, able to ambulate to bathroom then unable to stand, able to move upper extremities and then unable to feed self. -Lower extremity venous Dopplers negative for DVT  Obesity BMI 37.0  Code Status: full  DVT Prophylaxis:  Lovenox Family Communication: Discussed in detail with the patient, all imaging results, lab results explained to the patient's husband on phone yesterday   Disposition Plan: DC to CIR when bed available  Time Spent in minutes : 25 minutes  Procedures:  none   Consultants:   Infectious disease GI   Antimicrobials:   Anti-infectives (From admission, onward)   Start     Dose/Rate Route Frequency Ordered Stop   08/06/18 1000  fluconazole (DIFLUCAN) tablet 100 mg     100 mg Oral Daily  08/05/18 1643     08/04/18 2200  piperacillin-tazobactam (ZOSYN) IVPB 3.375 g  Status:  Discontinued     3.375 g 12.5 mL/hr over 240 Minutes Intravenous Every 8 hours 08/04/18 2156 08/05/18 1643   08/03/18 1000  fluconazole (DIFLUCAN) tablet 200 mg  Status:  Discontinued     200 mg Oral Daily 08/02/18 1500 08/05/18 1643   07/28/18 1700  vancomycin (VANCOCIN) IVPB 750 mg/150 ml premix  Status:  Discontinued     750 mg 150 mL/hr over 60 Minutes Intravenous Every 24 hours 07/27/18 1606 07/28/18 0947   07/28/18 1000  fluconazole (DIFLUCAN) IVPB 200 mg  Status:  Discontinued     200 mg 100 mL/hr over 60 Minutes Intravenous Every 24 hours 07/28/18 0954 08/02/18 1500   07/27/18 1800  ceFEPIme (MAXIPIME) 2 g in sodium chloride 0.9 % 100 mL IVPB  Status:  Discontinued     2 g 200 mL/hr over 30 Minutes Intravenous Every 8 hours 07/27/18 1606 08/01/18 1414   07/27/18 1730  fluconazole (DIFLUCAN) tablet 200 mg  Status:  Discontinued     200 mg Oral  Once 07/27/18 1717 07/28/18 0953   07/27/18 1615  vancomycin (VANCOCIN) 1,500 mg in sodium chloride 0.9 % 500 mL IVPB     1,500 mg 250 mL/hr over 120 Minutes Intravenous  Once 07/27/18 1606 07/27/18 1851   07/27/18 1415  cefTRIAXone (ROCEPHIN) 1 g in sodium chloride 0.9 % 100 mL IVPB     1 g 200 mL/hr over 30 Minutes Intravenous  Once 07/27/18 1404 07/27/18 1642         Medications  Scheduled Meds:  Chlorhexidine Gluconate Cloth  6 each Topical Daily   enoxaparin (LOVENOX) injection  40 mg Subcutaneous Q24H   feeding supplement  1 Container Oral Q24H   feeding supplement (ENSURE ENLIVE)  237 mL Oral Q24H   fluconazole  100 mg Oral Daily   folic acid  2 mg Oral Daily   free water  300 mL Oral Q8H   lidocaine  15 mL Mouth/Throat TID AC & HS   magnesium oxide  400 mg Oral BID   metoprolol tartrate  25 mg Oral BID   multivitamin with minerals  1 tablet Oral Daily   nystatin  5 mL Oral QID   phosphorus  500 mg Oral TID   sodium  chloride flush  3 mL Intravenous Once   Continuous Infusions:  potassium PHOSPHATE IVPB (in mmol) 30 mmol (08/06/18 1313)   PRN Meds:.acetaminophen, lip balm, LORazepam, metoprolol tartrate, ondansetron (ZOFRAN) IV, sodium chloride flush      Subjective:   Emily Hicks was seen and examined today.  Still spiking low-grade fevers, 100.5 F overnight.  Generalized weakness otherwise no specific complaints of coughing, nausea, vomiting.    Patient denies dizziness, chest pain, shortness of breath, abdominal pain, N/V/D/C.  Objective:   Vitals:   08/05/18 1822 08/05/18 2147 08/06/18 0251  08/06/18 0402  BP: 113/78 (!) 146/93  114/72  Pulse: (!) 121 (!) 124 (!) 138 (!) 116  Resp: 18 18 (!) 28 (!) 22  Temp: 98.8 F (37.1 C) 98.7 F (37.1 C) (!) 100.5 F (38.1 C) 99.9 F (37.7 C)  TempSrc: Oral Oral Oral Oral  SpO2: 98% 97%  92%  Weight:      Height:        Intake/Output Summary (Last 24 hours) at 08/06/2018 1337 Last data filed at 08/06/2018 1108 Gross per 24 hour  Intake 237 ml  Output 200 ml  Net 37 ml     Wt Readings from Last 3 Encounters:  07/31/18 89 kg  07/16/18 72.6 kg  07/06/18 90.7 kg    Physical Exam  General: Alert and oriented x 3, ill-appearing  Eyes  HEENT:    Cardiovascular: S1 S2 clear, no murmurs, RRR. No pedal edema b/l  Respiratory: CTAB, no wheezing, rales or rhonchi  Gastrointestinal: Soft, nontender, nondistended, NBS  Ext: no pedal edema bilaterally  Neuro: no new deficits  Musculoskeletal: No cyanosis, clubbing  Skin: No rashes  Psych: Normal affect and demeanor, alert and oriented x3     Data Reviewed:  I have personally reviewed following labs and imaging studies  Micro Results Recent Results (from the past 240 hour(s))  Blood Culture (routine x 2)     Status: None   Collection Time: 07/27/18  2:04 PM   Specimen: BLOOD  Result Value Ref Range Status   Specimen Description   Final    BLOOD LEFT ANTECUBITAL Performed at  [facility], [address], [place], [place] [address]    Special Requests   Final    BOTTLES DRAWN AEROBIC AND ANAEROBIC Blood Culture adequate volume Performed at [facility], [address] [place], [place] [address]    Culture   Final    NO GROWTH 5 DAYS Performed at [facility], [address], [place], [place] [address]    Report Status 08/01/2018 FINAL  Final  Blood Culture (routine x 2)     Status: None   Collection Time: 07/27/18  2:09 PM   Specimen: BLOOD  Result Value Ref Range Status   Specimen Description   Final    BLOOD RIGHT ANTECUBITAL Performed at [facility], [address], [place], [place] [address]    Special Requests   Final    BOTTLES DRAWN AEROBIC AND ANAEROBIC Blood Culture adequate volume Performed at [facility], [address], [place], [place] [address]    Culture   Final    NO GROWTH 5 DAYS Performed at [facility], [address] [place], [place] [address]    Report Status 08/01/2018 FINAL  Final  SARS Coronavirus 2 (CEPHEID - Performed in [facility] hospital lab), Hosp Order     Status: None   Collection Time: 07/27/18  2:34 PM   Specimen: Nasopharyngeal Swab  Result Value Ref Range Status   SARS Coronavirus 2 NEGATIVE NEGATIVE Final    Comment: (NOTE) If result is NEGATIVE SARS-CoV-2 target nucleic acids are NOT DETECTED. The SARS-CoV-2 RNA is generally detectable in upper and lower  respiratory specimens during the acute phase of infection. The lowest  concentration of SARS-CoV-2 viral copies this assay can detect is 250  copies / mL. A negative result does not preclude SARS-CoV-2 infection  and should not be used as the sole basis for treatment or other  patient management decisions.  A negative result may occur with  improper specimen collection / handling, submission of specimen other  than nasopharyngeal swab, presence of viral mutation(s)  within the  areas targeted by this assay, and inadequate number of viral copies  (<250 copies / mL). A negative result must be combined with clinical  observations, patient history, and epidemiological information. If result is POSITIVE SARS-CoV-2 target nucleic acids are DETECTED. The SARS-CoV-2 RNA is generally detectable in upper and lower  respiratory specimens dur ing the acute phase of infection.  Positive  results are indicative of active infection with SARS-CoV-2.  Clinical  correlation with patient history and other diagnostic information is  necessary to determine patient infection status.  Positive results do  not rule out bacterial infection or co-infection with other viruses. If result is PRESUMPTIVE POSTIVE SARS-CoV-2 nucleic acids MAY BE PRESENT.   A presumptive positive result was obtained on the submitted specimen  and confirmed on repeat testing.  While 2019 novel coronavirus  (SARS-CoV-2) nucleic acids may be present in the submitted sample  additional confirmatory testing may be necessary for epidemiological  and / or clinical management purposes  to differentiate between  SARS-CoV-2 and other Sarbecovirus currently known to infect humans.  If clinically indicated additional testing with an alternate test  methodology [phone]) is advised. The SARS-CoV-2 RNA is generally  detectable in upper and lower respiratory sp ecimens during the acute  phase of infection. The expected result is Negative. Fact Sheet for Patients:  BoilerBrush.com.cy Fact Sheet for Healthcare Providers: https://pope.com/ This test is not yet approved or cleared by the [place] FDA and has been authorized for detection and/or diagnosis of SARS-CoV-2 by FDA under an Emergency Use Authorization (EUA).  This EUA will remain in effect (meaning this test can be used) for the duration of the COVID-19 declaration under Section 564(b)(1) of the Act,  21 U.S.C. section 360bbb-3(b)(1), unless the authorization is terminated or revoked sooner. Performed at [facility], [address] [place], [place] [address]   Urine culture     Status: None   Collection Time: 07/27/18  5:01 PM   Specimen: Urine, Catheterized  Result Value Ref Range Status   Specimen Description   Final    URINE, CATHETERIZED Performed at [facility], [address], [place], [place] [address]    Special Requests   Final    NONE Performed at [facility], [address] [place], [place] [address]    Culture   Final    NO GROWTH Performed at [facility], [address] [place], [place] [address]    Report Status 07/28/2018 FINAL  Final  MRSA PCR Screening     Status: None   Collection Time: 07/27/18  5:39 PM   Specimen: Nasal Mucosa; Nasopharyngeal  Result Value Ref Range Status   MRSA by PCR NEGATIVE NEGATIVE Final    Comment:        The GeneXpert MRSA Assay (FDA approved for NASAL specimens only), is one component of a comprehensive MRSA colonization surveillance program. It is not intended to diagnose MRSA infection nor to guide or monitor treatment for MRSA infections. Performed at [facility], [address] [place], [place] [address]   Urine Culture     Status: None   Collection Time: 08/04/18  9:35 PM   Specimen: Urine, Clean Catch  Result Value Ref Range Status   Specimen Description   Final  URINE, CLEAN CATCH Performed at [facility], [address], [place], [place] [address]    Special Requests   Final    NONE Performed at [facility], [address], [place], [place] [address]    Culture   Final    NO GROWTH Performed at [facility], [address], [place], [place] [address]    Report Status 08/06/2018 FINAL  Final  Culture, blood (routine x 2)     Status: None (Preliminary result)    Collection Time: 08/04/18  9:41 PM   Specimen: BLOOD  Result Value Ref Range Status   Specimen Description   Final    BLOOD BLOOD RIGHT ARM Performed at [facility], [address] [place], [place] [address]    Special Requests   Final    BOTTLES DRAWN AEROBIC ONLY Blood Culture adequate volume Performed at [facility], [address] [place] [address], [place], [place] [address]    Culture   Final    NO GROWTH 1 DAY Performed at [facility], [address], [place], [place] [address]    Report Status PENDING  Incomplete  Culture, blood (routine x 2)     Status: None (Preliminary result)   Collection Time: 08/04/18  9:46 PM   Specimen: BLOOD  Result Value Ref Range Status   Specimen Description   Final    BLOOD BLOOD LEFT HAND Performed at [facility], [address], [place], [place] [address]    Special Requests   Final    BOTTLES DRAWN AEROBIC AND ANAEROBIC Blood Culture adequate volume Performed at [facility], [address] [place], [place] [address]    Culture  Setup Time   Final    AEROBIC BOTTLE ONLY GRAM POSITIVE COCCI CRITICAL RESULT CALLED TO, READ BACK BY AND VERIFIED WITH: Peggyann Juba PHARMD 08/06/18 0400 JDW Performed at [facility], [address], [place], [place] [address]    Culture GRAM POSITIVE COCCI  Final   Report Status PENDING  Incomplete  Blood Culture ID Panel (Reflexed)     Status: Abnormal   Collection Time: 08/04/18  9:46 PM  Result Value Ref Range Status   Enterococcus species NOT DETECTED NOT DETECTED Final   Listeria monocytogenes NOT DETECTED NOT DETECTED Final   Staphylococcus species DETECTED (A) NOT DETECTED Final    Comment: Methicillin (oxacillin) resistant coagulase negative staphylococcus. Possible blood culture contaminant (unless isolated from more than one blood culture draw or clinical case suggests pathogenicity). No antibiotic treatment is indicated for  blood  culture contaminants. CRITICAL RESULT CALLED TO, READ BACK BY AND VERIFIED WITH: J GRIMSLEY PHARMD 08/06/18 0400 JDW    Staphylococcus aureus (BCID) NOT DETECTED NOT DETECTED Final   Methicillin resistance DETECTED (A) NOT DETECTED Final    Comment: CRITICAL RESULT CALLED TO, READ BACK BY AND VERIFIED WITH: J GRIMSLEY PHARMD 08/06/18 0400 JDW    Streptococcus species NOT DETECTED NOT DETECTED Final   Streptococcus agalactiae NOT DETECTED NOT DETECTED Final   Streptococcus pneumoniae NOT DETECTED NOT DETECTED Final   Streptococcus pyogenes NOT DETECTED NOT DETECTED Final   Acinetobacter baumannii NOT DETECTED NOT DETECTED Final   Enterobacteriaceae species NOT DETECTED NOT DETECTED Final   Enterobacter cloacae complex NOT DETECTED NOT DETECTED Final   Escherichia coli NOT DETECTED NOT DETECTED Final   Klebsiella oxytoca NOT DETECTED NOT DETECTED Final   Klebsiella pneumoniae NOT DETECTED NOT DETECTED Final  Proteus species NOT DETECTED NOT DETECTED Final   Serratia marcescens NOT DETECTED NOT DETECTED Final   Haemophilus influenzae NOT DETECTED NOT DETECTED Final   Neisseria meningitidis NOT DETECTED NOT DETECTED Final   Pseudomonas aeruginosa NOT DETECTED NOT DETECTED Final   Candida albicans NOT DETECTED NOT DETECTED Final   Candida glabrata NOT DETECTED NOT DETECTED Final   Candida krusei NOT DETECTED NOT DETECTED Final   Candida parapsilosis NOT DETECTED NOT DETECTED Final   Candida tropicalis NOT DETECTED NOT DETECTED Final    Comment: Performed at [facility], [address] [place], [place] [address]  Culture, blood (routine x 2)     Status: None (Preliminary result)   Collection Time: 08/05/18  6:59 PM   Specimen: BLOOD  Result Value Ref Range Status   Specimen Description   Final    BLOOD LEFT HAND Performed at [facility], [address] [place], [place] [address]    Special Requests   Final    BOTTLES DRAWN AEROBIC ONLY Blood Culture  adequate volume Performed at [facility], [address] [place], [place] [address]    Culture   Final    NO GROWTH < 12 HOURS Performed at [facility], [address] [place], [place] [address]    Report Status PENDING  Incomplete    Radiology Reports Dg Chest 2 View  Result Date: 08/05/2018 CLINICAL DATA:  Fever of unknown origin, multiple sclerosis EXAM: CHEST - 2 VIEW COMPARISON:  07/27/2018 FINDINGS: Borderline enlargement of cardiac silhouette. Mediastinal contours and pulmonary vascularity normal. Bibasilar opacities question atelectasis versus infiltrate. Lungs otherwise clear. No pleural effusion or pneumothorax. Bones unremarkable. IMPRESSION: Bibasilar opacities question atelectasis versus infiltrate. Electronically Signed   By: Ulyses SouthwardMark  Boles M.D.   On: 08/05/2018 08:27   Dg Chest 2 View  Result Date: 07/27/2018 CLINICAL DATA:  Fever and malaise EXAM: CHEST - 2 VIEW COMPARISON:  July 16, 2018 FINDINGS: Lungs are clear. The heart size and pulmonary vascularity are normal. No adenopathy. No bone lesions. IMPRESSION: No edema or consolidation. Electronically Signed   By: Bretta BangWilliam  Woodruff III M.D.   On: 07/27/2018 15:00   Ct Chest W Contrast  Result Date: 08/02/2018 CLINICAL DATA:  Fever of unknown origin. EXAM: CT CHEST, ABDOMEN, AND PELVIS WITH CONTRAST TECHNIQUE: Multidetector CT imaging of the chest, abdomen and pelvis was performed following the standard protocol during bolus administration of intravenous contrast. CONTRAST:  100mL OMNIPAQUE IOHEXOL 300 MG/ML SOLN, 30mL OMNIPAQUE IOHEXOL 300 MG/ML SOLN COMPARISON:  None. FINDINGS: CT CHEST FINDINGS Cardiovascular: There is no large centrally located pulmonary embolus. Detection of smaller segmental and subsegmental pulmonary emboli is limited by contrast bolus timing. The main pulmonary artery is within normal limits for size. There is no CT evidence of acute right heart strain. The visualized aorta is  normal. Heart size is normal. There is a trace pericardial effusion. Mediastinum/Nodes: --there are mildly prominent mediastinal and hilar lymph nodes bilaterally. --there is mild bilateral axillary adenopathy. --No supraclavicular lymphadenopathy. --Normal thyroid gland. --The esophagus is unremarkable Lungs/Pleura: There are trace bilateral pleural effusions with adjacent atelectasis. There is scattered ground-glass airspace opacities bilaterally, greatest within the right upper lobe. There is no pneumothorax. The trachea is unremarkable. Musculoskeletal: No chest wall abnormality. No acute or significant osseous findings. 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 CT ABDOMEN PELVIS FINDINGS Hepatobiliary: The liver is borderline enlarged. Status post cholecystectomy.There is no biliary ductal dilation. Pancreas:  There is some mild peripancreatic fat stranding. Spleen: There is a small wedge-shaped defect in the lower pole the spleen measuring approximately 1.8 cm (axial series 2 image 47). Adrenals/Urinary Tract: --Adrenal glands: No adrenal hemorrhage. --Right kidney/ureter: No hydronephrosis or perinephric hematoma. --Left kidney/ureter: No hydronephrosis or perinephric hematoma. --Urinary bladder: Unremarkable. Stomach/Bowel: --Stomach/Duodenum: No hiatal hernia or other gastric abnormality. Normal duodenal course and caliber. --Small bowel: No dilatation or inflammation. --Colon: No focal abnormality. --Appendix: Normal. Vascular/Lymphatic: Normal course and caliber of the major abdominal vessels. There is a retroaortic left renal vein, a normal variant. --there are prominent retroperitoneal lymph nodes. --there are prominent mesenteric lymph nodes. There is haziness throughout the mesentery. There are enlarged right lower quadrant lymph nodes measuring approximately 1.1 cm in diameter. --there are mildly enlarged pelvic and inguinal lymph nodes. Reproductive: There is a small  amount of free fluid in the pelvis which may be physiologic. There is no discrete ovarian mass. Other: There is a small volume of pelvic free fluid which is likely physiologic. No free air. There is some mild fat stranding involving the anterior abdominal wall bilaterally. Musculoskeletal. No acute displaced fractures. IMPRESSION: 1. Scattered ground-glass airspace opacities, greatest within the right upper lobe, concerning for multifocal pneumonia. 2. Small bilateral pleural effusions with adjacent atelectasis. 3. Mild diffuse adenopathy throughout the chest, abdomen, and pelvis of unknown clinical significance. 4. Peripancreatic free fluid with edema within the root of the mesentery. Findings are nonspecific and can be seen in patients with pancreatitis, versus less likely sclerosing mesenteritis or inflammatory bowel disease. There are some prominent lymph nodes right lower quadrant which can be seen in patients with mesenteric adenitis. 5. Normal appendix in the right lower quadrant. 6. Small volume free fluid in the pelvis favored to be physiologic. 7. Wedge-shaped hypoattenuating defect in the spleen of unknown clinical significance. Differential considerations include a splenic infarct versus less likely a splenic laceration, especially in the absence of a history of trauma. There is no perisplenic hematoma. Electronically Signed   By: Katherine Mantle M.D.   On: 08/02/2018 00:18   Mr Brain Wo Contrast  Result Date: 07/28/2018 CLINICAL DATA:  Encephalopathy with hallucinations EXAM: MRI HEAD WITHOUT CONTRAST TECHNIQUE: Multiplanar, multiecho pulse sequences of the brain and surrounding structures were obtained without intravenous contrast. COMPARISON:  Brain MRI 03/24/2018 FINDINGS: BRAIN: There is no acute infarct, acute hemorrhage or extra-axial collection. The midline structures are normal. The white matter signal is normal for the patient's age. The cerebral and cerebellar volume are  age-appropriate. No hydrocephalus. Susceptibility-sensitive sequences show no chronic microhemorrhage or superficial siderosis. No midline shift or other mass effect. VASCULAR: The major intracranial arterial and venous sinus flow voids are normal. SKULL AND UPPER CERVICAL SPINE: Calvarial bone marrow signal is normal. There is no skull base mass. Visualized upper cervical spine and soft tissues are normal. SINUSES/ORBITS: No fluid levels or advanced mucosal thickening. No mastoid or middle ear effusion. The orbits are normal. IMPRESSION: Normal brain. Electronically Signed   By: Deatra Robinson M.D.   On: 07/28/2018 00:13   Mr Lumbar Spine Wo Contrast  Result Date: 08/02/2018 CLINICAL DATA:  Fever. Lower extremity weakness. History of multiple sclerosis. EXAM: MRI LUMBAR SPINE WITHOUT CONTRAST TECHNIQUE: Multiplanar, multisequence MR imaging of the lumbar spine was performed. No intravenous contrast was administered. COMPARISON:  CT abdomen pelvis from yesterday. FINDINGS: Despite efforts by the technologist and patient, motion artifact is present on today's exam and could not be eliminated. This reduces exam sensitivity and  specificity. Segmentation:  Standard. Alignment:  Physiologic. Vertebrae:  No fracture, evidence of discitis, or bone lesion. Conus medullaris and cauda equina: Conus extends to the L1-L2 level. Conus and cauda equina appear normal. Paraspinal and other soft tissues: Symmetric bilateral paraspinous muscle edema. Otherwise negative. Disc levels: No significant disc bulge or herniation. No spinal canal or neuroforaminal stenosis. IMPRESSION: 1.  No acute abnormality or significant degenerative changes. Electronically Signed   By: Obie Dredge M.D.   On: 08/02/2018 09:14   Ct Abdomen Pelvis W Contrast  Result Date: 08/02/2018 CLINICAL DATA:  Fever of unknown origin. EXAM: CT CHEST, ABDOMEN, AND PELVIS WITH CONTRAST TECHNIQUE: Multidetector CT imaging of the chest, abdomen and pelvis was  performed following the standard protocol during bolus administration of intravenous contrast. CONTRAST:  OMNIPAQUE IOHEXOL 300 MG/ML SOLN, 30mL OMNIPAQUE IOHEXOL 300 MG/ML SOLN COMPARISON:  None. FINDINGS: CT CHEST FINDINGS Cardiovascular: There is no large centrally located pulmonary embolus. Detection of smaller segmental and subsegmental pulmonary emboli is limited by contrast bolus timing. The main pulmonary artery is within normal limits for size. There is no CT evidence of acute right heart strain. The visualized aorta is normal. Heart size is normal. There is a trace pericardial effusion. Mediastinum/Nodes: --there are mildly prominent mediastinal and hilar lymph nodes bilaterally. --there is mild bilateral axillary adenopathy. --No supraclavicular lymphadenopathy. --Normal thyroid gland. --The esophagus is unremarkable Lungs/Pleura: There are trace bilateral pleural effusions with adjacent atelectasis. There is scattered ground-glass airspace opacities bilaterally, greatest within the right upper lobe. There is no pneumothorax. The trachea is unremarkable. Musculoskeletal: No chest wall abnormality. No acute or significant osseous findings. 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 CT ABDOMEN PELVIS FINDINGS Hepatobiliary: The liver is borderline enlarged. Status post cholecystectomy.There is no biliary ductal dilation. Pancreas: There is some mild peripancreatic fat stranding. Spleen: There is a small wedge-shaped defect in the lower pole the spleen measuring approximately 1.8 cm (axial series 2 image 47). Adrenals/Urinary Tract: --Adrenal glands: No adrenal hemorrhage. --Right kidney/ureter: No hydronephrosis or perinephric hematoma. --Left kidney/ureter: No hydronephrosis or perinephric hematoma. --Urinary bladder: Unremarkable. Stomach/Bowel: --Stomach/Duodenum: No hiatal hernia or other gastric abnormality. Normal duodenal course and caliber. --Small bowel: No  dilatation or inflammation. --Colon: No focal abnormality. --Appendix: Normal. Vascular/Lymphatic: Normal course and caliber of the major abdominal vessels. There is a retroaortic left renal vein, a normal variant. --there are prominent retroperitoneal lymph nodes. --there are prominent mesenteric lymph nodes. There is haziness throughout the mesentery. There are enlarged right lower quadrant lymph nodes measuring approximately 1.1 cm in diameter. --there are mildly enlarged pelvic and inguinal lymph nodes. Reproductive: There is a small amount of free fluid in the pelvis which may be physiologic. There is no discrete ovarian mass. Other: There is a small volume of pelvic free fluid which is likely physiologic. No free air. There is some mild fat stranding involving the anterior abdominal wall bilaterally. Musculoskeletal. No acute displaced fractures. IMPRESSION: 1. Scattered ground-glass airspace opacities, greatest within the right upper lobe, concerning for multifocal pneumonia. 2. Small bilateral pleural effusions with adjacent atelectasis. 3. Mild diffuse adenopathy throughout the chest, abdomen, and pelvis of unknown clinical significance. 4. Peripancreatic free fluid with edema within the root of the mesentery. Findings are nonspecific and can be seen in patients with pancreatitis, versus less likely sclerosing mesenteritis or inflammatory bowel disease. There are some prominent lymph nodes right lower quadrant which can be seen in patients with mesenteric adenitis. 5. Normal appendix in  the right lower quadrant. 6. Small volume free fluid in the pelvis favored to be physiologic. 7. Wedge-shaped hypoattenuating defect in the spleen of unknown clinical significance. Differential considerations include a splenic infarct versus less likely a splenic laceration, especially in the absence of a history of trauma. There is no perisplenic hematoma. Electronically Signed   By: Katherine Mantlehristopher  Motsinger M.D.   On: 08/02/2018  00:18   Dg Chest Port 1 View  Result Date: 07/16/2018 CLINICAL DATA:  Fatigue EXAM: PORTABLE CHEST 1 VIEW COMPARISON:  None. FINDINGS: The heart size and mediastinal contours are within normal limits. Both lungs are clear. The visualized skeletal structures are unremarkable. IMPRESSION: No active disease. Electronically Signed   By: Alcide CleverMark  Lukens M.D.   On: 07/16/2018 15:18   [place] Lower Extremity Venous (dvt)  Result Date: 08/06/2018  Lower Venous Study Indications: Immobility, fever.  Limitations: Patient positioning. Comparison Study: No prior Performing Technologist: Blanch MediaMegan Riddle RVS  Examination Guidelines: A complete evaluation includes B-mode imaging, spectral Doppler, color Doppler, and power Doppler as needed of all accessible portions of each vessel. Bilateral testing is considered an integral part of a complete examination. Limited examinations for reoccurring indications may be performed as noted.  +---------+---------------+---------+-----------+----------+--------------+  RIGHT     Compressibility Phasicity Spontaneity Properties Summary         +---------+---------------+---------+-----------+----------+--------------+  CFV       Full            Yes       Yes                                    +---------+---------------+---------+-----------+----------+--------------+  SFJ       Full                                                             +---------+---------------+---------+-----------+----------+--------------+  FV Prox   Full                                                             +---------+---------------+---------+-----------+----------+--------------+  FV Mid    Full                                                             +---------+---------------+---------+-----------+----------+--------------+  FV Distal Full                                                             +---------+---------------+---------+-----------+----------+--------------+  PFV       Full                                                              +---------+---------------+---------+-----------+----------+--------------+  POP       Full            Yes       Yes                                    +---------+---------------+---------+-----------+----------+--------------+  PTV       Full                                                             +---------+---------------+---------+-----------+----------+--------------+  PERO                                                       Not visualized  +---------+---------------+---------+-----------+----------+--------------+   +---------+---------------+---------+-----------+----------+--------------+  LEFT      Compressibility Phasicity Spontaneity Properties Summary         +---------+---------------+---------+-----------+----------+--------------+  CFV       Full            Yes       Yes                                    +---------+---------------+---------+-----------+----------+--------------+  SFJ       Full                                                             +---------+---------------+---------+-----------+----------+--------------+  FV Prox   Full                                                             +---------+---------------+---------+-----------+----------+--------------+  FV Mid    Full                                                             +---------+---------------+---------+-----------+----------+--------------+  FV Distal Full                                                             +---------+---------------+---------+-----------+----------+--------------+  PFV       Full                                                             +---------+---------------+---------+-----------+----------+--------------+  POP       Full            Yes       Yes                                    +---------+---------------+---------+-----------+----------+--------------+  PTV       Full                                                              +---------+---------------+---------+-----------+----------+--------------+  PERO                                                       Not visualized  +---------+---------------+---------+-----------+----------+--------------+     Summary: Right: There is no evidence of deep vein thrombosis in the lower extremity. No cystic structure found in the popliteal fossa. Left: There is no evidence of deep vein thrombosis in the lower extremity. No cystic structure found in the popliteal fossa.  *See table(s) above for measurements and observations. Electronically signed by Lemar Livings MD on 08/06/2018 at 11:00:59 AM.    Final     Lab Data:  CBC: Recent Labs  Lab 07/31/18 0221 08/01/18 0255  08/01/18 1930 08/02/18 0500 08/04/18 0245 08/05/18 1607 08/06/18 0350  WBC 2.8* 2.9*  --   --  3.0* 2.7* 2.8* 2.9*  NEUTROABS 2.0 2.1  --   --  2.2  --  1.8  --   HGB 7.2* 6.5*   < > 8.5* 8.1* 7.8* 8.6* 8.4*  HCT 22.8* 20.5*   < > 26.2* 25.2* 25.2* 27.7* 27.4*  MCV 83.5 84.4  --   --  86.3 87.5 90.8 89.0  PLT 151 145*  --   --  151 134* 154 166   < > = values in this interval not displayed.   Basic Metabolic Panel: Recent Labs  Lab 08/01/18 0255 08/02/18 0500 08/03/18 0620 08/04/18 0245 08/05/18 1607 08/06/18 0350  NA 143 142 142 140 139 140  K 3.5 2.9* 3.5 3.3* 3.2* 3.2*  CL 120* 118* 118* 117* 116* 116*  CO2 17* 17* 18* 19* 18* 17*  GLUCOSE 90 83 90 103* 110* 97  BUN 10 8 7 7 7 6   CREATININE 0.62 0.57 0.57 0.44 0.49 0.51  CALCIUM 7.1* 7.1* 7.3* 6.9* 6.8* 7.0*  MG 1.7 1.8 1.7 1.7  --  1.5*  PHOS  --   --  <1.0* 1.4*  --  <1.0*   GFR: Estimated Creatinine Clearance: 102.5 mL/min (by C-G formula based on SCr of 0.51 mg/dL). Liver Function Tests: Recent Labs  Lab 07/31/18 0221 08/01/18 0255 08/02/18 0500 08/03/18 0620 08/05/18 1607  AST 86* 98* 85*  --  58*  ALT 33 36 33  --  23  ALKPHOS 50 63 74  --  83  BILITOT 0.2* 0.3 0.3  --  0.2*  PROT 5.5* 5.4* 5.6*  --  5.2*  ALBUMIN 1.7*  1.7* 1.8* 1.8* 1.8*   Recent Labs  Lab 08/04/18 2141  LIPASE 45   No results for  input(s): AMMONIA in the last 168 hours. Coagulation Profile: Recent Labs  Lab 08/01/18 1021  INR 1.4*   Cardiac Enzymes: No results for input(s): CKTOTAL, CKMB, CKMBINDEX, TROPONINI in the last 168 hours. BNP (last 3 results) No results for input(s): PROBNP in the last 8760 hours. HbA1C: No results for input(s): HGBA1C in the last 72 hours. CBG: Recent Labs  Lab 07/31/18 1154 07/31/18 1630  GLUCAP 94 85   Lipid Profile: No results for input(s): CHOL, HDL, LDLCALC, TRIG, CHOLHDL, LDLDIRECT in the last 72 hours. Thyroid Function Tests: No results for input(s): TSH, T4TOTAL, FREET4, T3FREE, THYROIDAB in the last 72 hours. Anemia Panel: No results for input(s): VITAMINB12, FOLATE, FERRITIN, TIBC, IRON, RETICCTPCT in the last 72 hours. Urine analysis:    Component Value Date/Time   COLORURINE YELLOW 08/04/2018 2135   APPEARANCEUR HAZY (A) 08/04/2018 2135   LABSPEC 1.018 08/04/2018 2135   PHURINE 6.0 08/04/2018 2135   GLUCOSEU NEGATIVE 08/04/2018 2135   HGBUR NEGATIVE 08/04/2018 2135   BILIRUBINUR NEGATIVE 08/04/2018 2135   BILIRUBINUR Neg 05/08/2010 1553   KETONESUR NEGATIVE 08/04/2018 2135   PROTEINUR 100 (A) 08/04/2018 2135   UROBILINOGEN 1.0 11/11/2013 0150   NITRITE NEGATIVE 08/04/2018 2135   LEUKOCYTESUR NEGATIVE 08/04/2018 2135     Kariyah Baugh M.D. [facility] 08/06/2018, 1:37 PM  Pager: [phone] Between 7am to 7pm - call Pager - [phone]  After 7pm go to [email or web address] - password [email or web address]  Call night coverage person covering after 7pm

## 2018-08-06 NOTE — Progress Notes (Signed)
CRITICAL VALUE ALERT  Critical Value: Phosphorus less than 1.0   Date & Time Notied: 08/06/18 at [place]  Provider Notified: Rai, Ripudeep K  Orders Received/Actions taken: awaiting orders

## 2018-08-06 NOTE — Progress Notes (Signed)
Occupational Therapy Treatment Patient Details Name: Emily Hicks MRN: [id number] DOB: 01-13-1986 Today's Date: 08/06/2018    History of present illness This 33 year old female was admitted for progressive weakness and AMS.  She was recently dxd with MS   OT comments  Pt limited by HR. Able to get up to 3:1 commode with min A; second person available due to unsteadiness.   Follow Up Recommendations  CIR    Equipment Recommendations  3 in 1 bedside commode    Recommendations for Other Services      Precautions / Restrictions Precautions Precautions: Fall Precaution Comments: monitor HR Restrictions Weight Bearing Restrictions: No       Mobility Bed Mobility               General bed mobility comments: up in chair  Transfers       Sit to Stand: Min assist Stand pivot transfers: Min assist       General transfer comment: Min A for balance; did not use RW due to urgency to use commode and tight spaces    Balance     Sitting balance-Leahy Scale: Fair       Standing balance-Leahy Scale: Poor(needs min support in standing)                             ADL either performed or assessed with clinical judgement   ADL                           Toilet Transfer: Minimal assistance;BSC;Stand-pivot   Toileting- Clothing Manipulation and Hygiene: Total assistance;Sit to/from stand         General ADL Comments: had +2 assistance available in room.  HR increases with activity. Pt reports she has not been helping with bathing.  Encouraged her to do as much as she can to increase strength. Also reinforced energy conservation     Vision       Perception     Praxis      Cognition Arousal/Alertness: Awake/alert Behavior During Therapy: Flat affect                                   General Comments: followed all commands; flat affect        Exercises Other Exercises Other Exercises: did not perform UE exercises  today due to increased HR   Shoulder Instructions       General Comments HR increased during session. She needed to use BSC while I was there. HR up to 144    Pertinent Vitals/ Pain       Pain Assessment: No/denies pain  Home Living                                          Prior Functioning/Environment              Frequency  Min 2X/week        Progress Toward Goals  OT Goals(current goals can now be found in the care plan section)  Progress towards OT goals: Progressing toward goals     Plan      Co-evaluation  AM-PAC OT "6 Clicks" Daily Activity     Outcome Measure   Help from another person eating meals?: A Little Help from another person taking care of personal grooming?: A Little Help from another person toileting, which includes using toliet, bedpan, or urinal?: Total Help from another person bathing (including washing, rinsing, drying)?: A Lot Help from another person to put on and taking off regular upper body clothing?: A Lot Help from another person to put on and taking off regular lower body clothing?: Total 6 Click Score: 12    End of Session    OT Visit Diagnosis: Muscle weakness (generalized) (M62.81)   Activity Tolerance Patient tolerated treatment well   Patient Left in chair;with call bell/phone within reach;with chair alarm set   Nurse Communication          Time: 6269-4854 OT Time Calculation (min): 22 min  Charges: OT General Charges $OT Visit: 1 Visit OT Treatments $Self Care/Home Management : 8-22 mins  Emily Hicks, OTR/L Acute Rehabilitation Services [phone] [facility] pager [phone] office 08/06/2018   Emily Hicks 08/06/2018, 4:02 PM

## 2018-08-06 NOTE — Progress Notes (Signed)
PHARMACY - PHYSICIAN COMMUNICATION CRITICAL VALUE ALERT - BLOOD CULTURE IDENTIFICATION (BCID)  Emily Hicks is an 33 y.o. female who presented to [facility] on 07/27/2018 with a chief complaint of fever  Assessment:  Patient followed by ID and on fluconazole for Candida esophagitis.  BCID result 1 out of 4 per micro.   (include suspected source if known)  Name of physician (or Provider) Contacted: X. Blount  Current antibiotics: Fluconazole 100mg  po daily  Changes to prescribed antibiotics recommended:  No changes, BCID possibility contaminate, will have rounding team decide best course in AM.   Results for orders placed or performed during the hospital encounter of 07/27/18  Blood Culture ID Panel (Reflexed) (Collected: 08/04/2018  9:46 PM)  Result Value Ref Range   Enterococcus species NOT DETECTED NOT DETECTED   Listeria monocytogenes NOT DETECTED NOT DETECTED   Staphylococcus species DETECTED (A) NOT DETECTED   Staphylococcus aureus (BCID) NOT DETECTED NOT DETECTED   Methicillin resistance DETECTED (A) NOT DETECTED   Streptococcus species NOT DETECTED NOT DETECTED   Streptococcus agalactiae NOT DETECTED NOT DETECTED   Streptococcus pneumoniae NOT DETECTED NOT DETECTED   Streptococcus pyogenes NOT DETECTED NOT DETECTED   Acinetobacter baumannii NOT DETECTED NOT DETECTED   Enterobacteriaceae species NOT DETECTED NOT DETECTED   Enterobacter cloacae complex NOT DETECTED NOT DETECTED   Escherichia coli NOT DETECTED NOT DETECTED   Klebsiella oxytoca NOT DETECTED NOT DETECTED   Klebsiella pneumoniae NOT DETECTED NOT DETECTED   Proteus species NOT DETECTED NOT DETECTED   Serratia marcescens NOT DETECTED NOT DETECTED   Haemophilus influenzae NOT DETECTED NOT DETECTED   Neisseria meningitidis NOT DETECTED NOT DETECTED   Pseudomonas aeruginosa NOT DETECTED NOT DETECTED   Candida albicans NOT DETECTED NOT DETECTED   Candida glabrata NOT DETECTED NOT DETECTED   Candida krusei NOT  DETECTED NOT DETECTED   Candida parapsilosis NOT DETECTED NOT DETECTED   Candida tropicalis NOT DETECTED NOT DETECTED    Nani Skillern Crowford 08/06/2018  4:18 AM

## 2018-08-06 NOTE — Progress Notes (Signed)
Lower extremity venous has been completed.   Preliminary results in CV Proc.   Emily Hicks 08/06/2018 8:42 AM

## 2018-08-07 DIAGNOSIS — R197 Diarrhea, unspecified: Secondary | ICD-10-CM

## 2018-08-07 LAB — BASIC METABOLIC PANEL
Anion gap: 6 (ref 5–15)
BUN: 7 mg/dL (ref 6–20)
CO2: 18 mmol/L — ABNORMAL LOW (ref 22–32)
Calcium: 6.8 mg/dL — ABNORMAL LOW (ref 8.9–10.3)
Chloride: 115 mmol/L — ABNORMAL HIGH (ref 98–111)
Creatinine, Ser: 0.63 mg/dL (ref 0.44–1.00)
GFR calc Af Amer: >60 mL/min (ref >60)
GFR calc non Af Amer: >60 mL/min (ref >60)
Glucose, Bld: 95 mg/dL (ref 70–99)
Potassium: 3.3 mmol/L — ABNORMAL LOW (ref 3.5–5.1)
Sodium: 139 mmol/L (ref 135–145)

## 2018-08-07 LAB — CBC
HCT: 26.1 % — ABNORMAL LOW (ref 36.0–46.0)
Hemoglobin: 8.1 g/dL — ABNORMAL LOW (ref 12.0–15.0)
MCH: 27.9 pg (ref 26.0–34.0)
MCHC: 31 g/dL (ref 30.0–36.0)
MCV: 90 fL (ref 80.0–100.0)
Platelets: 172 10*3/uL (ref 150–400)
RBC: 2.9 MIL/uL — ABNORMAL LOW (ref 3.87–5.11)
RDW: 19.7 % — ABNORMAL HIGH (ref 11.5–15.5)
WBC: 2.2 10*3/uL — ABNORMAL LOW (ref 4.0–10.5)
nRBC: 1.3 % — ABNORMAL HIGH (ref 0.0–0.2)

## 2018-08-07 LAB — CULTURE, BLOOD (ROUTINE X 2): Special Requests: ADEQUATE

## 2018-08-07 LAB — MAGNESIUM: Magnesium: 1.5 mg/dL — ABNORMAL LOW (ref 1.7–2.4)

## 2018-08-07 LAB — C DIFFICILE QUICK SCREEN W PCR REFLEX
C Diff antigen: NEGATIVE
C Diff interpretation: NOT DETECTED
C Diff toxin: NEGATIVE

## 2018-08-07 LAB — PHOSPHORUS: Phosphorus: 1.9 mg/dL — ABNORMAL LOW (ref 2.5–4.6)

## 2018-08-07 MED ORDER — SODIUM CHLORIDE 0.9 % IV BOLUS
1000.0000 mL | Freq: Once | INTRAVENOUS | Status: AC
  Administered 2018-08-07: 14:00:00 1000 mL via INTRAVENOUS

## 2018-08-07 MED ORDER — FLUCONAZOLE 100 MG PO TABS: 100.0000 mg | ORAL_TABLET | ORAL | Status: DC

## 2018-08-07 MED ORDER — POTASSIUM PHOSPHATES 15 MMOLE/5ML IV SOLN
30.0000 mmol | Freq: Once | INTRAVENOUS | Status: AC
  Administered 2018-08-07: 30 mmol via INTRAVENOUS
  Filled 2018-08-07: qty 10

## 2018-08-07 MED ORDER — KETOROLAC TROMETHAMINE 30 MG/ML IJ SOLN
30.0000 mg | Freq: Once | INTRAMUSCULAR | Status: AC
  Administered 2018-08-07: 30 mg via INTRAVENOUS
  Filled 2018-08-07: qty 1

## 2018-08-07 NOTE — Progress Notes (Signed)
[facility]                                                                              Patient Demographics  Emily Hicks, is a 33 y.o. female, DOB - 09/09/1985, ZOX:[id number]  Admit date - 07/27/2018   Admitting Physician Burnadette Pop, MD  Outpatient Primary MD for the patient is Sandre Kitty, MD  Outpatient specialists:   LOS - 11  days   Medical records reviewed and are as summarized below:    Chief Complaint  Patient presents with   Fever       Brief summary  Emily Hicks is an 33 y.o. female with medical history significant ofmultiple sclerosis, depression who was brought by her husband to the emergency department for the evaluation of altered mental status, fever, generalized weakness. H/o MS off aubagio a week ago due to intolerance, presents with Confusion, weakness, loss of appetite, fever, sepsis.  Found to have severe oropharyngeal thrush with odynophagia.  Ginette Pitman has much improved, odynophagia has resolved.  Remittent fever on 08/03/2018.  Awaiting CIR.    Assessment & Plan    Sepsis, likely due to oropharyngeal candidiasis and esophageal Candida, intermittent fevers - COVID-19 screening negative, Initial w/u negative. Blood cultures negative so far.  Urine culture negative.  Chest x-ray showed no acute infiltrates.  No meningismal signs. - CMV IgM elevated at 240, no CMV DNA detected - HIV/RPR negative, HSV PCR negative, EBV IgG+ but IgM-  - reviewed recent CT chest, abd and pelvis-> multifocal PNA, peripancreatic fluid, pancreatitis, vs mesenteric adenitis -Started on IV Zosyn on 7/1 but discontinued by infectious disease -Repeat LFTs, lipase normal.  No UTI. -Follow mycoplasma serologies, check urine Legionella antigen.  No UTI. -2D echo 6/29 showed no endocarditis - BCID 1/2 + coagulase-negative staph, likely contaminant.  -Complaining of diarrhea, C. difficile PCR ordered -Continue Diflucan, prophylactic per  ID  Hypernatremia -Sodium 157 at the time of admission -Likely due to dehydration -Sodium improving, 139  Hypokalemia, hypomagnesemia -Potassium 3.3, replaced -Magnesium 1.5.  Continue magnesium supplement  Severe hypophosphatemia -Phosphorus 1.9, will give 1 dose of K-Phos IV, continue phosphorus supplement  Sinus tachycardia TSH 0.9, likely due to #1 Continue beta-blocker.  CT chest on 6/28 was negative for PE.  Acute kidney injury -Presented with creatinine of 1.2 -Improved with IV fluid hydration  Transaminitis -Possible side effect from aubagio -AST improving  Normocytic anemia Likely due to anemia of chronic disease -H&H stable, follow closely  History of multiple sclerosis -Recently stopped aubagio a week prior to admission due to pancytopenia, abnormal liver function -MRI of the brain showed no acute findings. -Per neurology, Dr. Laurence Slate generalized weakness likely pseudo-exacerbation due to acute illness/sepsis  Generalized weakness - MRI of the lumbar spine showed no acute abnormality. - Plan for CIR when afebrile for 24 hours -There was a question of possible some component of conversion disorder versus cognitive deficits related to MS/sepsis.  Per PT, inconsistent with physical presentation, able to ambulate to bathroom then unable to stand, able to move upper extremities and then unable to feed self. -Lower extremity venous Dopplers negative for DVT  Obesity  BMI 37.0   Code Status: full  DVT Prophylaxis:  Lovenox Family Communication: Discussed in detail with the patient, all imaging results, lab results explained to the patient   Disposition Plan:   Time Spent in minutes : 25 minutes  Procedures:  none   Consultants:   Infectious disease GI   Antimicrobials:   Anti-infectives (From admission, onward)   Start     Dose/Rate Route Frequency Ordered Stop   08/14/18 0859  fluconazole (DIFLUCAN) tablet 100 mg     100 mg Oral Weekly  08/07/18 1128     08/06/18 1000  fluconazole (DIFLUCAN) tablet 100 mg  Status:  Discontinued     100 mg Oral Daily 08/05/18 1643 08/07/18 1128   08/04/18 2200  piperacillin-tazobactam (ZOSYN) IVPB 3.375 g  Status:  Discontinued     3.375 g 12.5 mL/hr over 240 Minutes Intravenous Every 8 hours 08/04/18 2156 08/05/18 1643   08/03/18 1000  fluconazole (DIFLUCAN) tablet 200 mg  Status:  Discontinued     200 mg Oral Daily 08/02/18 1500 08/05/18 1643   07/28/18 1700  vancomycin (VANCOCIN) IVPB 750 mg/150 ml premix  Status:  Discontinued     750 mg 150 mL/hr over 60 Minutes Intravenous Every 24 hours 07/27/18 1606 07/28/18 0947   07/28/18 1000  fluconazole (DIFLUCAN) IVPB 200 mg  Status:  Discontinued     200 mg 100 mL/hr over 60 Minutes Intravenous Every 24 hours 07/28/18 0954 08/02/18 1500   07/27/18 1800  ceFEPIme (MAXIPIME) 2 g in sodium chloride 0.9 % 100 mL IVPB  Status:  Discontinued     2 g 200 mL/hr over 30 Minutes Intravenous Every 8 hours 07/27/18 1606 08/01/18 1414   07/27/18 1730  fluconazole (DIFLUCAN) tablet 200 mg  Status:  Discontinued     200 mg Oral  Once 07/27/18 1717 07/28/18 0953   07/27/18 1615  vancomycin (VANCOCIN) 1,500 mg in sodium chloride 0.9 % 500 mL IVPB     1,500 mg 250 mL/hr over 120 Minutes Intravenous  Once 07/27/18 1606 07/27/18 1851   07/27/18 1415  cefTRIAXone (ROCEPHIN) 1 g in sodium chloride 0.9 % 100 mL IVPB     1 g 200 mL/hr over 30 Minutes Intravenous  Once 07/27/18 1404 07/27/18 1642         Medications  Scheduled Meds:  Chlorhexidine Gluconate Cloth  6 each Topical Daily   enoxaparin (LOVENOX) injection  40 mg Subcutaneous Q24H   feeding supplement  1 Container Oral Q24H   feeding supplement (ENSURE ENLIVE)  237 mL Oral Q24H   [START ON 08/14/2018] fluconazole  100 mg Oral Weekly   folic acid  2 mg Oral Daily   free water  300 mL Oral Q8H   lidocaine  15 mL Mouth/Throat TID AC & HS   magnesium oxide  400 mg Oral BID    metoprolol tartrate  25 mg Oral BID   multivitamin with minerals  1 tablet Oral Daily   nystatin  5 mL Oral QID   phosphorus  500 mg Oral TID   sodium chloride flush  3 mL Intravenous Once   Continuous Infusions:  potassium PHOSPHATE IVPB (in mmol) 30 mmol (08/07/18 0705)   PRN Meds:.acetaminophen, lip balm, LORazepam, metoprolol tartrate, ondansetron (ZOFRAN) IV, sodium chloride flush      Subjective:   Aldona BarJamie Newsome was seen and examined today.  No acute complaints, heart rate elevated, sinus tachycardia, low-grade temp.  Generalized weakness otherwise no nausea vomiting or coughing.  Patient denies dizziness, chest pain, shortness of breath, abdominal pain, N/V/D/C.  Objective:   Vitals:   08/06/18 2124 08/07/18 0447 08/07/18 0600 08/07/18 1144  BP: 104/64 115/70  105/68  Pulse: (!) 118 (!) 125  (!) 125  Resp: (!) 21 (!) 24  20  Temp: 99.6 F (37.6 C) 99.5 F (37.5 C) 99.9 F (37.7 C) 99.3 F (37.4 C)  TempSrc: Oral Oral Oral Oral  SpO2: 96% 94%  98%  Weight:      Height:        Intake/Output Summary (Last 24 hours) at 08/07/2018 1248 Last data filed at 08/07/2018 1151 Gross per 24 hour  Intake 1450.73 ml  Output 202 ml  Net 1248.73 ml     Wt Readings from Last 3 Encounters:  07/31/18 89 kg  07/16/18 72.6 kg  07/06/18 90.7 kg    Physical Exam  General: Alert and oriented x 3, ill-appearing  Eyes:  HEENT:  Atraumatic, normocephalic  Cardiovascular: S1 S2 clear, tachycardia, RRR.  No pedal edema b/l  Respiratory: CTAB, no wheezing, rales or rhonchi  Gastrointestinal: Soft, nontender, nondistended, NBS  Ext: no pedal edema bilaterally  Neuro: no new deficits  Musculoskeletal: No cyanosis, clubbing  Skin: No rashes  Psych: Normal affect and demeanor, alert and oriented x3    Data Reviewed:  I have personally reviewed following labs and imaging studies  Micro Results Recent Results (from the past 240 hour(s))  Urine Culture     Status:  None   Collection Time: 08/04/18  9:35 PM   Specimen: Urine, Clean Catch  Result Value Ref Range Status   Specimen Description   Final    URINE, CLEAN CATCH Performed at [facility] [address] [place], [place] [address]    Special Requests   Final    NONE Performed at [facility], [place] [address] [place], [place] [address]    Culture   Final    NO GROWTH Performed at [facility], [place] [address] [place], [place] [address]    Report Status 08/06/2018 FINAL  Final  Culture, blood (routine x 2)     Status: None (Preliminary result)   Collection Time: 08/04/18  9:41 PM   Specimen: BLOOD  Result Value Ref Range Status   Specimen Description   Final    BLOOD BLOOD RIGHT ARM Performed at [facility] [address], [place], [place] [address]    Special Requests   Final    BOTTLES DRAWN AEROBIC ONLY Blood Culture adequate volume Performed at [facility] [address], [place], [place] [address]    Culture   Final    NO GROWTH 1 DAY Performed at [facility], [place] [address], [place], [place] [address]    Report Status PENDING  Incomplete  Culture, blood (routine x 2)     Status: Abnormal   Collection Time: 08/04/18  9:46 PM   Specimen: BLOOD  Result Value Ref Range Status   Specimen Description   Final    BLOOD BLOOD LEFT HAND Performed at [facility] [address], [place], [place] [address]    Special Requests   Final    BOTTLES DRAWN AEROBIC AND ANAEROBIC Blood Culture adequate volume Performed at [facility] [address], [place], [place] [address]    Culture  Setup Time   Final    AEROBIC BOTTLE ONLY GRAM POSITIVE COCCI CRITICAL RESULT CALLED  TO, READ BACK BY AND VERIFIED WITH: J GRIMSLEY PHARMD 08/06/18 0400 JDW    Culture (A)  Final    STAPHYLOCOCCUS SPECIES (COAGULASE NEGATIVE) THE SIGNIFICANCE OF ISOLATING THIS ORGANISM  FROM A SINGLE SET OF BLOOD CULTURES WHEN MULTIPLE SETS ARE DRAWN IS UNCERTAIN. PLEASE NOTIFY THE MICROBIOLOGY DEPARTMENT WITHIN ONE WEEK IF SPECIATION AND SENSITIVITIES ARE REQUIRED. Performed at [facility], [address] [place], [place] [address]    Report Status 08/07/2018 FINAL  Final  Blood Culture ID Panel (Reflexed)     Status: Abnormal   Collection Time: 08/04/18  9:46 PM  Result Value Ref Range Status   Enterococcus species NOT DETECTED NOT DETECTED Final   Listeria monocytogenes NOT DETECTED NOT DETECTED Final   Staphylococcus species DETECTED (A) NOT DETECTED Final    Comment: Methicillin (oxacillin) resistant coagulase negative staphylococcus. Possible blood culture contaminant (unless isolated from more than one blood culture draw or clinical case suggests pathogenicity). No antibiotic treatment is indicated for blood  culture contaminants. CRITICAL RESULT CALLED TO, READ BACK BY AND VERIFIED WITH: J GRIMSLEY PHARMD 08/06/18 0400 JDW    Staphylococcus aureus (BCID) NOT DETECTED NOT DETECTED Final   Methicillin resistance DETECTED (A) NOT DETECTED Final    Comment: CRITICAL RESULT CALLED TO, READ BACK BY AND VERIFIED WITH: J GRIMSLEY PHARMD 08/06/18 0400 JDW    Streptococcus species NOT DETECTED NOT DETECTED Final   Streptococcus agalactiae NOT DETECTED NOT DETECTED Final   Streptococcus pneumoniae NOT DETECTED NOT DETECTED Final   Streptococcus pyogenes NOT DETECTED NOT DETECTED Final   Acinetobacter baumannii NOT DETECTED NOT DETECTED Final   Enterobacteriaceae species NOT DETECTED NOT DETECTED Final   Enterobacter cloacae complex NOT DETECTED NOT DETECTED Final   Escherichia coli NOT DETECTED NOT DETECTED Final   Klebsiella oxytoca NOT DETECTED NOT DETECTED Final   Klebsiella pneumoniae NOT DETECTED NOT DETECTED Final   Proteus species NOT DETECTED NOT DETECTED Final   Serratia marcescens NOT DETECTED NOT DETECTED Final   Haemophilus influenzae NOT DETECTED NOT  DETECTED Final   Neisseria meningitidis NOT DETECTED NOT DETECTED Final   Pseudomonas aeruginosa NOT DETECTED NOT DETECTED Final   Candida albicans NOT DETECTED NOT DETECTED Final   Candida glabrata NOT DETECTED NOT DETECTED Final   Candida krusei NOT DETECTED NOT DETECTED Final   Candida parapsilosis NOT DETECTED NOT DETECTED Final   Candida tropicalis NOT DETECTED NOT DETECTED Final    Comment: Performed at [facility], [address], [place], [place] [address]  Culture, blood (routine x 2)     Status: None (Preliminary result)   Collection Time: 08/05/18  6:59 PM   Specimen: BLOOD  Result Value Ref Range Status   Specimen Description   Final    BLOOD LEFT HAND Performed at [facility], [address], [place], [place] [address]    Special Requests   Final    BOTTLES DRAWN AEROBIC ONLY Blood Culture adequate volume Performed at [facility], [address] [place], [place] [address]    Culture   Final    NO GROWTH < 12 HOURS Performed at [facility], [address] [place], [place] [address]    Report Status PENDING  Incomplete    Radiology Reports Dg Chest 2 View  Result Date: 08/05/2018 CLINICAL DATA:  Fever of unknown origin, multiple sclerosis EXAM: CHEST - 2 VIEW COMPARISON:  07/27/2018 FINDINGS: Borderline enlargement of cardiac silhouette. Mediastinal contours and pulmonary vascularity normal. Bibasilar  opacities question atelectasis versus infiltrate. Lungs otherwise clear. No pleural effusion or pneumothorax. Bones unremarkable. IMPRESSION: Bibasilar opacities question atelectasis versus infiltrate. Electronically Signed   By: Ulyses SouthwardMark  Boles M.D.   On: 08/05/2018 08:27   Dg Chest 2 View  Result Date: 07/27/2018 CLINICAL DATA:  Fever and malaise EXAM: CHEST - 2 VIEW COMPARISON:  July 16, 2018 FINDINGS: Lungs are clear. The heart size and pulmonary vascularity are normal. No adenopathy. No bone lesions. IMPRESSION: No  edema or consolidation. Electronically Signed   By: Bretta BangWilliam  Woodruff III M.D.   On: 07/27/2018 15:00   Ct Chest W Contrast  Result Date: 08/02/2018 CLINICAL DATA:  Fever of unknown origin. EXAM: CT CHEST, ABDOMEN, AND PELVIS WITH CONTRAST TECHNIQUE: Multidetector CT imaging of the chest, abdomen and pelvis was performed following the standard protocol during bolus administration of intravenous contrast. CONTRAST:  100mL OMNIPAQUE IOHEXOL 300 MG/ML SOLN, 30mL OMNIPAQUE IOHEXOL 300 MG/ML SOLN COMPARISON:  None. FINDINGS: CT CHEST FINDINGS Cardiovascular: There is no large centrally located pulmonary embolus. Detection of smaller segmental and subsegmental pulmonary emboli is limited by contrast bolus timing. The main pulmonary artery is within normal limits for size. There is no CT evidence of acute right heart strain. The visualized aorta is normal. Heart size is normal. There is a trace pericardial effusion. Mediastinum/Nodes: --there are mildly prominent mediastinal and hilar lymph nodes bilaterally. --there is mild bilateral axillary adenopathy. --No supraclavicular lymphadenopathy. --Normal thyroid gland. --The esophagus is unremarkable Lungs/Pleura: There are trace bilateral pleural effusions with adjacent atelectasis. There is scattered ground-glass airspace opacities bilaterally, greatest within the right upper lobe. There is no pneumothorax. The trachea is unremarkable. Musculoskeletal: No chest wall abnormality. No acute or significant osseous findings. 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 CT ABDOMEN PELVIS FINDINGS Hepatobiliary: The liver is borderline enlarged. Status post cholecystectomy.There is no biliary ductal dilation. Pancreas: There is some mild peripancreatic fat stranding. Spleen: There is a small wedge-shaped defect in the lower pole the spleen measuring approximately 1.8 cm (axial series 2 image 47). Adrenals/Urinary Tract: --Adrenal glands: No adrenal  hemorrhage. --Right kidney/ureter: No hydronephrosis or perinephric hematoma. --Left kidney/ureter: No hydronephrosis or perinephric hematoma. --Urinary bladder: Unremarkable. Stomach/Bowel: --Stomach/Duodenum: No hiatal hernia or other gastric abnormality. Normal duodenal course and caliber. --Small bowel: No dilatation or inflammation. --Colon: No focal abnormality. --Appendix: Normal. Vascular/Lymphatic: Normal course and caliber of the major abdominal vessels. There is a retroaortic left renal vein, a normal variant. --there are prominent retroperitoneal lymph nodes. --there are prominent mesenteric lymph nodes. There is haziness throughout the mesentery. There are enlarged right lower quadrant lymph nodes measuring approximately 1.1 cm in diameter. --there are mildly enlarged pelvic and inguinal lymph nodes. Reproductive: There is a small amount of free fluid in the pelvis which may be physiologic. There is no discrete ovarian mass. Other: There is a small volume of pelvic free fluid which is likely physiologic. No free air. There is some mild fat stranding involving the anterior abdominal wall bilaterally. Musculoskeletal. No acute displaced fractures. IMPRESSION: 1. Scattered ground-glass airspace opacities, greatest within the right upper lobe, concerning for multifocal pneumonia. 2. Small bilateral pleural effusions with adjacent atelectasis. 3. Mild diffuse adenopathy throughout the chest, abdomen, and pelvis of unknown clinical significance. 4. Peripancreatic free fluid with edema within the root of the mesentery. Findings are nonspecific and can be seen in patients with pancreatitis, versus less likely sclerosing mesenteritis or inflammatory bowel disease. There are some prominent lymph nodes right lower  quadrant which can be seen in patients with mesenteric adenitis. 5. Normal appendix in the right lower quadrant. 6. Small volume free fluid in the pelvis favored to be physiologic. 7. Wedge-shaped  hypoattenuating defect in the spleen of unknown clinical significance. Differential considerations include a splenic infarct versus less likely a splenic laceration, especially in the absence of a history of trauma. There is no perisplenic hematoma. Electronically Signed   By: Katherine Mantlehristopher  Whitfill M.D.   On: 08/02/2018 00:18   Mr Brain Wo Contrast  Result Date: 07/28/2018 CLINICAL DATA:  Encephalopathy with hallucinations EXAM: MRI HEAD WITHOUT CONTRAST TECHNIQUE: Multiplanar, multiecho pulse sequences of the brain and surrounding structures were obtained without intravenous contrast. COMPARISON:  Brain MRI 03/24/2018 FINDINGS: BRAIN: There is no acute infarct, acute hemorrhage or extra-axial collection. The midline structures are normal. The white matter signal is normal for the patient's age. The cerebral and cerebellar volume are age-appropriate. No hydrocephalus. Susceptibility-sensitive sequences show no chronic microhemorrhage or superficial siderosis. No midline shift or other mass effect. VASCULAR: The major intracranial arterial and venous sinus flow voids are normal. SKULL AND UPPER CERVICAL SPINE: Calvarial bone marrow signal is normal. There is no skull base mass. Visualized upper cervical spine and soft tissues are normal. SINUSES/ORBITS: No fluid levels or advanced mucosal thickening. No mastoid or middle ear effusion. The orbits are normal. IMPRESSION: Normal brain. Electronically Signed   By: Deatra RobinsonKevin  Herman M.D.   On: 07/28/2018 00:13   Mr Lumbar Spine Wo Contrast  Result Date: 08/02/2018 CLINICAL DATA:  Fever. Lower extremity weakness. History of multiple sclerosis. EXAM: MRI LUMBAR SPINE WITHOUT CONTRAST TECHNIQUE: Multiplanar, multisequence MR imaging of the lumbar spine was performed. No intravenous contrast was administered. COMPARISON:  CT abdomen pelvis from yesterday. FINDINGS: Despite efforts by the technologist and patient, motion artifact is present on today's exam and could not be  eliminated. This reduces exam sensitivity and specificity. Segmentation:  Standard. Alignment:  Physiologic. Vertebrae:  No fracture, evidence of discitis, or bone lesion. Conus medullaris and cauda equina: Conus extends to the L1-L2 level. Conus and cauda equina appear normal. Paraspinal and other soft tissues: Symmetric bilateral paraspinous muscle edema. Otherwise negative. Disc levels: No significant disc bulge or herniation. No spinal canal or neuroforaminal stenosis. IMPRESSION: 1.  No acute abnormality or significant degenerative changes. Electronically Signed   By: Obie DredgeWilliam T Derry M.D.   On: 08/02/2018 09:14   Ct Abdomen Pelvis W Contrast  Result Date: 08/02/2018 CLINICAL DATA:  Fever of unknown origin. EXAM: CT CHEST, ABDOMEN, AND PELVIS WITH CONTRAST TECHNIQUE: Multidetector CT imaging of the chest, abdomen and pelvis was performed following the standard protocol during bolus administration of intravenous contrast. CONTRAST:  100mL OMNIPAQUE IOHEXOL 300 MG/ML SOLN, 30mL OMNIPAQUE IOHEXOL 300 MG/ML SOLN COMPARISON:  None. FINDINGS: CT CHEST FINDINGS Cardiovascular: There is no large centrally located pulmonary embolus. Detection of smaller segmental and subsegmental pulmonary emboli is limited by contrast bolus timing. The main pulmonary artery is within normal limits for size. There is no CT evidence of acute right heart strain. The visualized aorta is normal. Heart size is normal. There is a trace pericardial effusion. Mediastinum/Nodes: --there are mildly prominent mediastinal and hilar lymph nodes bilaterally. --there is mild bilateral axillary adenopathy. --No supraclavicular lymphadenopathy. --Normal thyroid gland. --The esophagus is unremarkable Lungs/Pleura: There are trace bilateral pleural effusions with adjacent atelectasis. There is scattered ground-glass airspace opacities bilaterally, greatest within the right upper lobe. There is no pneumothorax. The trachea is unremarkable.  Musculoskeletal: No chest wall  abnormality. No acute or significant osseous findings. 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 CT ABDOMEN PELVIS FINDINGS Hepatobiliary: The liver is borderline enlarged. Status post cholecystectomy.There is no biliary ductal dilation. Pancreas: There is some mild peripancreatic fat stranding. Spleen: There is a small wedge-shaped defect in the lower pole the spleen measuring approximately 1.8 cm (axial series 2 image 47). Adrenals/Urinary Tract: --Adrenal glands: No adrenal hemorrhage. --Right kidney/ureter: No hydronephrosis or perinephric hematoma. --Left kidney/ureter: No hydronephrosis or perinephric hematoma. --Urinary bladder: Unremarkable. Stomach/Bowel: --Stomach/Duodenum: No hiatal hernia or other gastric abnormality. Normal duodenal course and caliber. --Small bowel: No dilatation or inflammation. --Colon: No focal abnormality. --Appendix: Normal. Vascular/Lymphatic: Normal course and caliber of the major abdominal vessels. There is a retroaortic left renal vein, a normal variant. --there are prominent retroperitoneal lymph nodes. --there are prominent mesenteric lymph nodes. There is haziness throughout the mesentery. There are enlarged right lower quadrant lymph nodes measuring approximately 1.1 cm in diameter. --there are mildly enlarged pelvic and inguinal lymph nodes. Reproductive: There is a small amount of free fluid in the pelvis which may be physiologic. There is no discrete ovarian mass. Other: There is a small volume of pelvic free fluid which is likely physiologic. No free air. There is some mild fat stranding involving the anterior abdominal wall bilaterally. Musculoskeletal. No acute displaced fractures. IMPRESSION: 1. Scattered ground-glass airspace opacities, greatest within the right upper lobe, concerning for multifocal pneumonia. 2. Small bilateral pleural effusions with adjacent atelectasis. 3. Mild diffuse adenopathy  throughout the chest, abdomen, and pelvis of unknown clinical significance. 4. Peripancreatic free fluid with edema within the root of the mesentery. Findings are nonspecific and can be seen in patients with pancreatitis, versus less likely sclerosing mesenteritis or inflammatory bowel disease. There are some prominent lymph nodes right lower quadrant which can be seen in patients with mesenteric adenitis. 5. Normal appendix in the right lower quadrant. 6. Small volume free fluid in the pelvis favored to be physiologic. 7. Wedge-shaped hypoattenuating defect in the spleen of unknown clinical significance. Differential considerations include a splenic infarct versus less likely a splenic laceration, especially in the absence of a history of trauma. There is no perisplenic hematoma. Electronically Signed   By: Katherine Mantle M.D.   On: 08/02/2018 00:18   Dg Chest Port 1 View  Result Date: 07/16/2018 CLINICAL DATA:  Fatigue EXAM: PORTABLE CHEST 1 VIEW COMPARISON:  None. FINDINGS: The heart size and mediastinal contours are within normal limits. Both lungs are clear. The visualized skeletal structures are unremarkable. IMPRESSION: No active disease. Electronically Signed   By: Alcide Clever M.D.   On: 07/16/2018 15:18   Vas [place] Lower Extremity Venous (dvt)  Result Date: 08/06/2018  Lower Venous Study Indications: Immobility, fever.  Limitations: Patient positioning. Comparison Study: No prior Performing Technologist: Blanch Media RVS  Examination Guidelines: A complete evaluation includes B-mode imaging, spectral Doppler, color Doppler, and power Doppler as needed of all accessible portions of each vessel. Bilateral testing is considered an integral part of a complete examination. Limited examinations for reoccurring indications may be performed as noted.  +---------+---------------+---------+-----------+----------+--------------+  RIGHT     Compressibility Phasicity Spontaneity Properties Summary          +---------+---------------+---------+-----------+----------+--------------+  CFV       Full            Yes       Yes                                    +---------+---------------+---------+-----------+----------+--------------+  SFJ       Full                                                             +---------+---------------+---------+-----------+----------+--------------+  FV Prox   Full                                                             +---------+---------------+---------+-----------+----------+--------------+  FV Mid    Full                                                             +---------+---------------+---------+-----------+----------+--------------+  FV Distal Full                                                             +---------+---------------+---------+-----------+----------+--------------+  PFV       Full                                                             +---------+---------------+---------+-----------+----------+--------------+  POP       Full            Yes       Yes                                    +---------+---------------+---------+-----------+----------+--------------+  PTV       Full                                                             +---------+---------------+---------+-----------+----------+--------------+  PERO                                                       Not visualized  +---------+---------------+---------+-----------+----------+--------------+   +---------+---------------+---------+-----------+----------+--------------+  LEFT      Compressibility Phasicity Spontaneity Properties Summary         +---------+---------------+---------+-----------+----------+--------------+  CFV       Full            Yes       Yes                                    +---------+---------------+---------+-----------+----------+--------------+  SFJ       Full                                                              +---------+---------------+---------+-----------+----------+--------------+  FV Prox   Full                                                             +---------+---------------+---------+-----------+----------+--------------+  FV Mid    Full                                                             +---------+---------------+---------+-----------+----------+--------------+  FV Distal Full                                                             +---------+---------------+---------+-----------+----------+--------------+  PFV       Full                                                             +---------+---------------+---------+-----------+----------+--------------+  POP       Full            Yes       Yes                                    +---------+---------------+---------+-----------+----------+--------------+  PTV       Full                                                             +---------+---------------+---------+-----------+----------+--------------+  PERO                                                       Not visualized  +---------+---------------+---------+-----------+----------+--------------+     Summary: Right: There is no evidence of deep vein thrombosis in the lower extremity. No cystic structure found in the popliteal fossa. Left: There is no evidence of deep vein thrombosis in the lower extremity. No cystic structure found in the popliteal fossa.  *See table(s) above for measurements and observations. Electronically signed by Lemar Livings MD on 08/06/2018 at 11:00:59 AM.  Final     Lab Data:  CBC: Recent Labs  Lab 08/01/18 0255  08/02/18 0500 08/04/18 0245 08/05/18 1607 08/06/18 0350 08/07/18 0354  WBC 2.9*  --  3.0* 2.7* 2.8* 2.9* 2.2*  NEUTROABS 2.1  --  2.2  --  1.8  --   --   HGB 6.5*   < > 8.1* 7.8* 8.6* 8.4* 8.1*  HCT 20.5*   < > 25.2* 25.2* 27.7* 27.4* 26.1*  MCV 84.4  --  86.3 87.5 90.8 89.0 90.0  PLT 145*  --  151 134* 154 166 172   < > = values in this  interval not displayed.   Basic Metabolic Panel: Recent Labs  Lab 08/02/18 0500 08/03/18 0620 08/04/18 0245 08/05/18 1607 08/06/18 0350 08/07/18 0354  NA 142 142 140 139 140 139  K 2.9* 3.5 3.3* 3.2* 3.2* 3.3*  CL 118* 118* 117* 116* 116* 115*  CO2 17* 18* 19* 18* 17* 18*  GLUCOSE 83 90 103* 110* 97 95  BUN CREATININE 0.57 0.57 0.44 0.49 0.51 0.63  CALCIUM 7.1* 7.3* 6.9* 6.8* 7.0* 6.8*  MG 1.8 1.7 1.7  --  1.5* 1.5*  PHOS  --  <1.0* 1.4*  --  <1.0* 1.9*   GFR: Estimated Creatinine Clearance: 102.5 mL/min (by C-G formula based on SCr of 0.63 mg/dL). Liver Function Tests: Recent Labs  Lab 08/01/18 0255 08/02/18 0500 08/03/18 0620 08/05/18 1607  AST 98* 85*  --  58*  ALT 36 33  --  23  ALKPHOS 63 74  --  83  BILITOT 0.3 0.3  --  0.2*  PROT 5.4* 5.6*  --  5.2*  ALBUMIN 1.7* 1.8* 1.8* 1.8*   Recent Labs  Lab 08/04/18 2141  LIPASE 45   No results for input(s): AMMONIA in the last 168 hours. Coagulation Profile: Recent Labs  Lab 08/01/18 1021  INR 1.4*   Cardiac Enzymes: No results for input(s): CKTOTAL, CKMB, CKMBINDEX, TROPONINI in the last 168 hours. BNP (last 3 results) No results for input(s): PROBNP in the last 8760 hours. HbA1C: No results for input(s): HGBA1C in the last 72 hours. CBG: Recent Labs  Lab 07/31/18 1630  GLUCAP 85   Lipid Profile: No results for input(s): CHOL, HDL, LDLCALC, TRIG, CHOLHDL, LDLDIRECT in the last 72 hours. Thyroid Function Tests: No results for input(s): TSH, T4TOTAL, FREET4, T3FREE, THYROIDAB in the last 72 hours. Anemia Panel: No results for input(s): VITAMINB12, FOLATE, FERRITIN, TIBC, IRON, RETICCTPCT in the last 72 hours. Urine analysis:    Component Value Date/Time   COLORURINE YELLOW 08/04/2018 2135   APPEARANCEUR HAZY (A) 08/04/2018 2135   LABSPEC 1.018 08/04/2018 2135   PHURINE 6.0 08/04/2018 2135   GLUCOSEU NEGATIVE 08/04/2018 2135   HGBUR NEGATIVE 08/04/2018 2135   BILIRUBINUR NEGATIVE  08/04/2018 2135   BILIRUBINUR Neg 05/08/2010 1553   KETONESUR NEGATIVE 08/04/2018 2135   PROTEINUR 100 (A) 08/04/2018 2135   UROBILINOGEN 1.0 11/11/2013 0150   NITRITE NEGATIVE 08/04/2018 2135   LEUKOCYTESUR NEGATIVE 08/04/2018 2135     Tabius Rood M.D. [facility] 08/07/2018, 12:48 PM  Pager: [phone] Between 7am to 7pm - call Pager - [phone]  After 7pm go to [email or web address] - password [email or web address]  Call night coverage person covering after 7pm

## 2018-08-07 NOTE — Progress Notes (Signed)
Patient ID: Emily Hicks, female   DOB: Mar 29, 1985, 33 y.o.   MRN: [id number]         [facility] for Infectious Disease  Date of Admission:  07/27/2018           Day 11 fluconazole ASSESSMENT: The cause of her recent fevers remains unclear.  I will send a stool sample for C. difficile.  Her CMV viral load was negative making acute cytomegalovirus infection and unlikely cause of her recent fevers.  Her candidal esophagitis has resolved.  I will switch her to a weekly prophylactic dose of fluconazole assuming that she will remain at risk while on therapy for MS.  PLAN: 1. Send stool sample for C. difficile testing 2. Change fluconazole to 100 mg weekly  Principal Problem:   Severe sepsis (HCC) Active Problems:   Fever   AMS (altered mental status)   Candida esophagitis (HCC)   MS (multiple sclerosis) (HCC)   Tachycardia   Leukopenia   AKI (acute kidney injury) (HCC)   Hypernatremia   Acute metabolic encephalopathy   Hypokalemia   Hypomagnesemia   Normocytic anemia   Pressure injury of skin   Elevated liver enzymes   Scheduled Meds: . Chlorhexidine Gluconate Cloth  6 each Topical Daily  . enoxaparin (LOVENOX) injection  40 mg Subcutaneous Q24H  . feeding supplement  1 Container Oral Q24H  . feeding supplement (ENSURE ENLIVE)  237 mL Oral Q24H  . fluconazole  100 mg Oral Daily  . folic acid  2 mg Oral Daily  . free water  300 mL Oral Q8H  . lidocaine  15 mL Mouth/Throat TID AC & HS  . magnesium oxide  400 mg Oral BID  . metoprolol tartrate  25 mg Oral BID  . multivitamin with minerals  1 tablet Oral Daily  . nystatin  5 mL Oral QID  . phosphorus  500 mg Oral TID  . sodium chloride flush  3 mL Intravenous Once   Continuous Infusions: . potassium PHOSPHATE IVPB (in mmol) 30 mmol (08/07/18 0705)   PRN Meds:.acetaminophen, lip balm, LORazepam, metoprolol tartrate, ondansetron (ZOFRAN) IV, sodium chloride flush   SUBJECTIVE: Emily Hicks tells me that she started  having frequent, watery diarrhea several days ago.  She is no longer having any pain or difficulty swallowing.  Review of Systems: Review of Systems  Constitutional: Positive for fever. Negative for chills and diaphoresis.  HENT: Negative for congestion and sore throat.   Respiratory: Negative for cough, sputum production and shortness of breath.   Cardiovascular: Negative for chest pain.  Gastrointestinal: Positive for diarrhea. Negative for abdominal pain, nausea and vomiting.  Genitourinary: Negative for dysuria.  Musculoskeletal: Negative for back pain and joint pain.  Skin: Negative for rash.  Neurological: Negative for headaches.    No Known Allergies  OBJECTIVE: Vitals:   08/06/18 1947 08/06/18 2124 08/07/18 0447 08/07/18 0600  BP:  104/64 115/70   Pulse:  (!) 118 (!) 125   Resp:  (!) 21 (!) 24   Temp: 98.9 F (37.2 C) 99.6 F (37.6 C) 99.5 F (37.5 C) 99.9 F (37.7 C)  TempSrc: Oral Oral Oral Oral  SpO2:  96% 94%   Weight:      Height:       Body mass index is 37.07 kg/m.  Physical Exam Constitutional:      Comments: She is resting quietly in bed.  HENT:     Mouth/Throat:     Pharynx: No oropharyngeal exudate.  Cardiovascular:  Rate and Rhythm: Regular rhythm.     Heart sounds: No murmur.     Comments: She is tachycardic. Pulmonary:     Effort: Pulmonary effort is normal.     Breath sounds: Normal breath sounds.  Abdominal:     Palpations: Abdomen is soft.     Tenderness: There is no abdominal tenderness.  Musculoskeletal:        General: No swelling or tenderness.  Skin:    Findings: No rash.  Psychiatric:     Comments: Flat affect.     Lab Results Lab Results  Component Value Date   WBC 2.2 (L) 08/07/2018   HGB 8.1 (L) 08/07/2018   HCT 26.1 (L) 08/07/2018   MCV 90.0 08/07/2018   PLT 172 08/07/2018    Lab Results  Component Value Date   CREATININE 0.63 08/07/2018   BUN 7 08/07/2018   NA 139 08/07/2018   K 3.3 (L) 08/07/2018   CL  115 (H) 08/07/2018   CO2 18 (L) 08/07/2018    Lab Results  Component Value Date   ALT 23 08/05/2018   AST 58 (H) 08/05/2018   ALKPHOS 83 08/05/2018   BILITOT 0.2 (L) 08/05/2018     Microbiology: Recent Results (from the past 240 hour(s))  Urine Culture     Status: None   Collection Time: 08/04/18  9:35 PM   Specimen: Urine, Clean Catch  Result Value Ref Range Status   Specimen Description   Final    URINE, CLEAN CATCH Performed at [facility], [address] [place], [place] [address]    Special Requests   Final    NONE Performed at [facility], [address] [place], [place] [address]    Culture   Final    NO GROWTH Performed at [facility], [address] [place], [place] [address]    Report Status 08/06/2018 FINAL  Final  Culture, blood (routine x 2)     Status: None (Preliminary result)   Collection Time: 08/04/18  9:41 PM   Specimen: BLOOD  Result Value Ref Range Status   Specimen Description   Final    BLOOD BLOOD RIGHT ARM Performed at [facility], [address] [place], [place] [address]    Special Requests   Final    BOTTLES DRAWN AEROBIC ONLY Blood Culture adequate volume Performed at [facility], [address] [place], [place] [address]    Culture   Final    NO GROWTH 1 DAY Performed at [facility], [address] [place], [place] [address]    Report Status PENDING  Incomplete  Culture, blood (routine x 2)     Status: None (Preliminary result)   Collection Time: 08/04/18  9:46 PM   Specimen: BLOOD  Result Value Ref Range Status   Specimen Description   Final    BLOOD BLOOD LEFT HAND Performed at [facility], [address] [place], [place] [address]    Special Requests   Final    BOTTLES DRAWN AEROBIC AND ANAEROBIC Blood Culture adequate volume Performed at [facility], [address] [place], [place]  [address]    Culture  Setup Time   Final    AEROBIC BOTTLE ONLY GRAM POSITIVE COCCI CRITICAL RESULT CALLED TO, READ BACK BY AND VERIFIED WITH: Peggyann JubaJ GRIMSLEY PHARMD 08/06/18 0400 JDW Performed at [facility], [address] [place], [place] [address]  Culture GRAM POSITIVE COCCI  Final   Report Status PENDING  Incomplete  Blood Culture ID Panel (Reflexed)     Status: Abnormal   Collection Time: 08/04/18  9:46 PM  Result Value Ref Range Status   Enterococcus species NOT DETECTED NOT DETECTED Final   Listeria monocytogenes NOT DETECTED NOT DETECTED Final   Staphylococcus species DETECTED (A) NOT DETECTED Final    Comment: Methicillin (oxacillin) resistant coagulase negative staphylococcus. Possible blood culture contaminant (unless isolated from more than one blood culture draw or clinical case suggests pathogenicity). No antibiotic treatment is indicated for blood  culture contaminants. CRITICAL RESULT CALLED TO, READ BACK BY AND VERIFIED WITH: J GRIMSLEY PHARMD 08/06/18 0400 JDW    Staphylococcus aureus (BCID) NOT DETECTED NOT DETECTED Final   Methicillin resistance DETECTED (A) NOT DETECTED Final    Comment: CRITICAL RESULT CALLED TO, READ BACK BY AND VERIFIED WITH: J GRIMSLEY PHARMD 08/06/18 0400 JDW    Streptococcus species NOT DETECTED NOT DETECTED Final   Streptococcus agalactiae NOT DETECTED NOT DETECTED Final   Streptococcus pneumoniae NOT DETECTED NOT DETECTED Final   Streptococcus pyogenes NOT DETECTED NOT DETECTED Final   Acinetobacter baumannii NOT DETECTED NOT DETECTED Final   Enterobacteriaceae species NOT DETECTED NOT DETECTED Final   Enterobacter cloacae complex NOT DETECTED NOT DETECTED Final   Escherichia coli NOT DETECTED NOT DETECTED Final   Klebsiella oxytoca NOT DETECTED NOT DETECTED Final   Klebsiella pneumoniae NOT DETECTED NOT DETECTED Final   Proteus species NOT DETECTED NOT DETECTED Final   Serratia marcescens NOT DETECTED NOT DETECTED Final   Haemophilus  influenzae NOT DETECTED NOT DETECTED Final   Neisseria meningitidis NOT DETECTED NOT DETECTED Final   Pseudomonas aeruginosa NOT DETECTED NOT DETECTED Final   Candida albicans NOT DETECTED NOT DETECTED Final   Candida glabrata NOT DETECTED NOT DETECTED Final   Candida krusei NOT DETECTED NOT DETECTED Final   Candida parapsilosis NOT DETECTED NOT DETECTED Final   Candida tropicalis NOT DETECTED NOT DETECTED Final    Comment: Performed at [facility], [place]. [address], [place], [place] [address]  Culture, blood (routine x 2)     Status: None (Preliminary result)   Collection Time: 08/05/18  6:59 PM   Specimen: BLOOD  Result Value Ref Range Status   Specimen Description   Final    BLOOD LEFT HAND Performed at [facility] [address], [place], [place] [address]    Special Requests   Final    BOTTLES DRAWN AEROBIC ONLY Blood Culture adequate volume Performed at [facility] [address] [place], [place] [address]    Culture   Final    NO GROWTH < 12 HOURS Performed at [facility] [address], [place], [place] [address]    Report Status PENDING  Incomplete    Michel Bickers, MD [facility] for [facility] [phone] pager   [phone] cell 08/07/2018, 11:21 AM

## 2018-08-07 NOTE — Progress Notes (Signed)
Pt temp 101.9.  Blood pressure 99/64 and HR sustaining in the 120's.  MD made aware.  Tylenol and 1 liter NS bolus given per MD order.  PT continuing to complain of R side, lower jaw tooth pain.  PT has refused to eat breakfast or lunch due to pain.  Will continue to encourage PO intake as able.

## 2018-08-07 NOTE — Progress Notes (Addendum)
Pt unable to tolerate taking anything by mouth 2/2 severe right sided lower tooth pain; on call provider paged & requested IV tylenol since pt has a temp of 101.5 at this time.

## 2018-08-07 NOTE — Progress Notes (Signed)
C-dif sample sent.  Patient met C dif criteria by having multiple watery stools without any laxatives in the past 48 hours.  She also had a fever greater than 100.4.  Charge nurse, Marolyn Hammock agrees that stool sample meets criteria to be sent.

## 2018-08-08 ENCOUNTER — Encounter (HOSPITAL_COMMUNITY): Payer: Self-pay

## 2018-08-08 ENCOUNTER — Inpatient Hospital Stay (HOSPITAL_COMMUNITY): Payer: 59

## 2018-08-08 LAB — BASIC METABOLIC PANEL
Anion gap: 5 (ref 5–15)
BUN: 10 mg/dL (ref 6–20)
CO2: 18 mmol/L — ABNORMAL LOW (ref 22–32)
Calcium: 6.4 mg/dL — CL (ref 8.9–10.3)
Chloride: 117 mmol/L — ABNORMAL HIGH (ref 98–111)
Creatinine, Ser: 0.84 mg/dL (ref 0.44–1.00)
GFR calc Af Amer: >60 mL/min (ref >60)
GFR calc non Af Amer: >60 mL/min (ref >60)
Glucose, Bld: 87 mg/dL (ref 70–99)
Potassium: 3.3 mmol/L — ABNORMAL LOW (ref 3.5–5.1)
Sodium: 140 mmol/L (ref 135–145)

## 2018-08-08 LAB — CBC
HCT: 24.9 % — ABNORMAL LOW (ref 36.0–46.0)
Hemoglobin: 7.5 g/dL — ABNORMAL LOW (ref 12.0–15.0)
MCH: 27.4 pg (ref 26.0–34.0)
MCHC: 30.1 g/dL (ref 30.0–36.0)
MCV: 90.9 fL (ref 80.0–100.0)
Platelets: 145 10*3/uL — ABNORMAL LOW (ref 150–400)
RBC: 2.74 MIL/uL — ABNORMAL LOW (ref 3.87–5.11)
RDW: 19.9 % — ABNORMAL HIGH (ref 11.5–15.5)
WBC: 1.9 10*3/uL — ABNORMAL LOW (ref 4.0–10.5)
nRBC: 0 % (ref 0.0–0.2)

## 2018-08-08 LAB — HEPATIC FUNCTION PANEL
ALT: 28 U/L (ref 0–44)
AST: 87 U/L — ABNORMAL HIGH (ref 15–41)
Albumin: 1.7 g/dL — ABNORMAL LOW (ref 3.5–5.0)
Alkaline Phosphatase: 74 U/L (ref 38–126)
Bilirubin, Direct: 0.1 mg/dL (ref 0.0–0.2)
Indirect Bilirubin: 0.2 mg/dL — ABNORMAL LOW (ref 0.3–0.9)
Total Bilirubin: 0.3 mg/dL (ref 0.3–1.2)
Total Protein: 5.1 g/dL — ABNORMAL LOW (ref 6.5–8.1)

## 2018-08-08 LAB — PHOSPHORUS: Phosphorus: 2.8 mg/dL (ref 2.5–4.6)

## 2018-08-08 LAB — MAGNESIUM: Magnesium: 1.4 mg/dL — ABNORMAL LOW (ref 1.7–2.4)

## 2018-08-08 MED ORDER — BENZOCAINE 10 % MT GEL
Freq: Four times a day (QID) | OROMUCOSAL | Status: DC | PRN
  Filled 2018-08-08: qty 9.4

## 2018-08-08 MED ORDER — MAGNESIUM SULFATE 50 % IJ SOLN: 4.0000 g | Freq: Once | INTRAVENOUS | Status: DC

## 2018-08-08 MED ORDER — POTASSIUM CHLORIDE 10 MEQ/100ML IV SOLN
10.0000 meq | Freq: Once | INTRAVENOUS | Status: AC
  Administered 2018-08-08: 20:00:00 10 meq via INTRAVENOUS
  Filled 2018-08-08: qty 100

## 2018-08-08 MED ORDER — LOPERAMIDE HCL 2 MG PO CAPS
4.0000 mg | ORAL_CAPSULE | Freq: Once | ORAL | Status: AC
  Administered 2018-08-08: 4 mg via ORAL
  Filled 2018-08-08: qty 2

## 2018-08-08 MED ORDER — POTASSIUM CHLORIDE 10 MEQ/100ML IV SOLN
10.0000 meq | INTRAVENOUS | Status: AC
  Administered 2018-08-08: 10 meq via INTRAVENOUS
  Filled 2018-08-08: qty 100

## 2018-08-08 MED ORDER — KETOROLAC TROMETHAMINE 15 MG/ML IJ SOLN
15.0000 mg | Freq: Four times a day (QID) | INTRAMUSCULAR | Status: DC | PRN
  Administered 2018-08-08: 15 mg via INTRAVENOUS
  Filled 2018-08-08: qty 1

## 2018-08-08 MED ORDER — AMOXICILLIN 250 MG PO CAPS
500.0000 mg | ORAL_CAPSULE | Freq: Three times a day (TID) | ORAL | Status: DC
  Administered 2018-08-08 (×2): 500 mg via ORAL
  Filled 2018-08-08: qty 2

## 2018-08-08 MED ORDER — IOHEXOL 300 MG/ML  SOLN
75.0000 mL | Freq: Once | INTRAMUSCULAR | Status: AC | PRN
  Administered 2018-08-08: 75 mL via INTRAVENOUS

## 2018-08-08 MED ORDER — HYDROMORPHONE HCL 1 MG/ML IJ SOLN: 0.5000 mg | INTRAMUSCULAR | Status: DC | PRN

## 2018-08-08 MED ORDER — LOPERAMIDE HCL 2 MG PO CAPS: 2.0000 mg | ORAL_CAPSULE | Freq: Three times a day (TID) | ORAL | Status: DC | PRN

## 2018-08-08 MED ORDER — PRO-STAT SUGAR FREE PO LIQD
30.0000 mL | Freq: Two times a day (BID) | ORAL | Status: DC
  Administered 2018-08-08 – 2018-08-16 (×10): 30 mL via ORAL
  Filled 2018-08-08 (×15): qty 30

## 2018-08-08 MED ORDER — CALCIUM GLUCONATE-NACL 2-0.675 GM/100ML-% IV SOLN
2.0000 g | Freq: Once | INTRAVENOUS | Status: AC
  Administered 2018-08-08: 2000 mg via INTRAVENOUS
  Filled 2018-08-08: qty 100

## 2018-08-08 MED ORDER — ALBUMIN HUMAN 25 % IV SOLN
25.0000 g | Freq: Three times a day (TID) | INTRAVENOUS | Status: AC
  Administered 2018-08-08 – 2018-08-09 (×3): 25 g via INTRAVENOUS
  Filled 2018-08-08 (×3): qty 100

## 2018-08-08 MED ORDER — HYDROCORTISONE ACETATE 25 MG RE SUPP
25.0000 mg | Freq: Two times a day (BID) | RECTAL | Status: DC
  Administered 2018-08-15: 25 mg via RECTAL
  Filled 2018-08-08 (×16): qty 1

## 2018-08-08 MED ORDER — CALCIUM GLUCONATE-NACL 1-0.675 GM/50ML-% IV SOLN
1.0000 g | Freq: Once | INTRAVENOUS | Status: AC
  Administered 2018-08-08: 1000 mg via INTRAVENOUS
  Filled 2018-08-08: qty 50

## 2018-08-08 MED ORDER — MAGNESIUM SULFATE 4 GM/100ML IV SOLN
4.0000 g | Freq: Once | INTRAVENOUS | Status: AC
  Administered 2018-08-08: 4 g via INTRAVENOUS
  Filled 2018-08-08: qty 100

## 2018-08-08 MED ORDER — SODIUM CHLORIDE (PF) 0.9 % IJ SOLN
INTRAMUSCULAR | Status: AC
  Filled 2018-08-08: qty 50

## 2018-08-08 NOTE — Progress Notes (Signed)
Pt has critical value of Ca+: 6.4. On call provider notified, awaiting response.

## 2018-08-08 NOTE — Progress Notes (Signed)
[facility]                                                                              Patient Demographics  Emily Hicks, is a 33 y.o. female, DOB - 25-Sep-1985, ZOX:[id number]  Admit date - 07/27/2018   Admitting Physician Burnadette Pop, MD  Outpatient Primary MD for the patient is Sandre Kitty, MD  Outpatient specialists:   LOS - 12  days   Medical records reviewed and are as summarized below:    Chief Complaint  Patient presents with   Fever       Brief summary  Emily Hicks is an 33 y.o. female with medical history significant ofmultiple sclerosis, depression who was brought by her husband to the emergency department for the evaluation of altered mental status, fever, generalized weakness. H/o MS off aubagio a week ago due to intolerance, presents with Confusion, weakness, loss of appetite, fever, sepsis.  Found to have severe oropharyngeal thrush with odynophagia.  Emily Hicks has much improved, odynophagia has resolved.  Remittent fever on 08/03/2018.  Awaiting CIR.    Assessment & Plan    Sepsis, likely due to oropharyngeal candidiasis and esophageal Candida, intermittent fevers - COVID-19 screening negative, Initial w/u negative. Blood cultures negative so far.  Urine culture negative.  Chest x-ray showed no acute infiltrates.  No meningismal signs. - CMV IgM elevated at 240, no CMV DNA detected - HIV/RPR negative, HSV PCR negative, EBV IgG+ but IgM-  - reviewed recent CT chest, abd and pelvis-> multifocal PNA, peripancreatic fluid, pancreatitis, vs mesenteric adenitis. Started on IV Zosyn on 7/1 but discontinued by ID -Repeat LFTs, lipase normal.  No UTI. -mycoplasma serologies pending, urine Legionella antigen pending.  No UTI. -2D echo 6/29 showed no endocarditis - BCID 1/2 + coagulase-negative staph, likely contaminant.  -C. difficile negative. -Continue Diflucan, prophylactic per ID -Complaining of right lower molar tooth pain, panoramic  x-ray not available.  Will check CT maxillofacial.  Placed on oral amoxicillin for now.  No oral dental coverage available.  Hypernatremia -Sodium 157 at the time of admission -Likely due to dehydration -Sodium improving  Hypokalemia, hypomagnesemia -Potassium 3.3, replaced -Magnesium 1.4, continue magnesium supplement, IV  Severe hypophosphatemia -Phosphorus improving, continue supplement   Hypocalcemia -Corrected calcium 8.2, will give 1 dose of calcium gluconate  Hypoalbuminemia, severe protein calorie malnutrition -Albumin 1.7 -Will give 3 doses of IV albumin, placed on pro-stat -Encouraged oral diet however patient complains of tooth pain and unable to eat, change diet to dysphagia 2  Sinus tachycardia TSH 0.9, likely due to #1 Continue beta-blocker.  CT chest on 6/28 was negative for PE.  Acute kidney injury -Presented with creatinine of 1.2 -Improved with IV fluid hydration  Transaminitis -Possible side effect from aubagio -AST improving  Normocytic anemia Likely due to anemia of chronic disease -H&H stable, follow closely  History of multiple sclerosis -Recently stopped aubagio a week prior to admission due to pancytopenia, abnormal liver function -MRI of the brain showed no acute findings. -Per neurology, Dr. Laurence Slate generalized weakness likely pseudo-exacerbation due to acute illness/sepsis  Generalized weakness - MRI of the lumbar spine showed no  acute abnormality. - Plan for CIR when afebrile for 24 hours -There was a question of possible some component of conversion disorder versus cognitive deficits related to MS/sepsis.  Per PT, inconsistent with physical presentation, able to ambulate to bathroom then unable to stand, able to move upper extremities and then unable to feed self. -Lower extremity venous Dopplers negative for DVT  Obesity BMI 37.0   Code Status: full  DVT Prophylaxis:  Lovenox Family Communication: Discussed in detail with the  patient, all imaging results, lab results explained to the patient's husband on the phone   Disposition Plan: CIR when bed available and afebrile for at least 24 hours  Time Spent in minutes : 25 minutes  Procedures:  none   Consultants:   Infectious disease GI   Antimicrobials:   Anti-infectives (From admission, onward)   Start     Dose/Rate Route Frequency Ordered Stop   08/14/18 0859  fluconazole (DIFLUCAN) tablet 100 mg     100 mg Oral Weekly 08/07/18 1128     08/08/18 0815  amoxicillin (AMOXIL) capsule 500 mg     500 mg Oral Every 8 hours 08/08/18 0745 08/15/18 0559   08/06/18 1000  fluconazole (DIFLUCAN) tablet 100 mg  Status:  Discontinued     100 mg Oral Daily 08/05/18 1643 08/07/18 1128   08/04/18 2200  piperacillin-tazobactam (ZOSYN) IVPB 3.375 g  Status:  Discontinued     3.375 g 12.5 mL/hr over 240 Minutes Intravenous Every 8 hours 08/04/18 2156 08/05/18 1643   08/03/18 1000  fluconazole (DIFLUCAN) tablet 200 mg  Status:  Discontinued     200 mg Oral Daily 08/02/18 1500 08/05/18 1643   07/28/18 1700  vancomycin (VANCOCIN) IVPB 750 mg/150 ml premix  Status:  Discontinued     750 mg 150 mL/hr over 60 Minutes Intravenous Every 24 hours 07/27/18 1606 07/28/18 0947   07/28/18 1000  fluconazole (DIFLUCAN) IVPB 200 mg  Status:  Discontinued     200 mg 100 mL/hr over 60 Minutes Intravenous Every 24 hours 07/28/18 0954 08/02/18 1500   07/27/18 1800  ceFEPIme (MAXIPIME) 2 g in sodium chloride 0.9 % 100 mL IVPB  Status:  Discontinued     2 g 200 mL/hr over 30 Minutes Intravenous Every 8 hours 07/27/18 1606 08/01/18 1414   07/27/18 1730  fluconazole (DIFLUCAN) tablet 200 mg  Status:  Discontinued     200 mg Oral  Once 07/27/18 1717 07/28/18 0953   07/27/18 1615  vancomycin (VANCOCIN) 1,500 mg in sodium chloride 0.9 % 500 mL IVPB     1,500 mg 250 mL/hr over 120 Minutes Intravenous  Once 07/27/18 1606 07/27/18 1851   07/27/18 1415  cefTRIAXone (ROCEPHIN) 1 g in sodium  chloride 0.9 % 100 mL IVPB     1 g 200 mL/hr over 30 Minutes Intravenous  Once 07/27/18 1404 07/27/18 1642         Medications  Scheduled Meds:  amoxicillin  500 mg Oral Q8H   Chlorhexidine Gluconate Cloth  6 each Topical Daily   enoxaparin (LOVENOX) injection  40 mg Subcutaneous Q24H   feeding supplement  1 Container Oral Q24H   feeding supplement (ENSURE ENLIVE)  237 mL Oral Q24H   feeding supplement (PRO-STAT SUGAR FREE 64)  30 mL Oral BID   [START ON 08/14/2018] fluconazole  100 mg Oral Weekly   folic acid  2 mg Oral Daily   free water  300 mL Oral Q8H   lidocaine  15 mL Mouth/Throat TID  AC & HS   loperamide  4 mg Oral Once   magnesium oxide  400 mg Oral BID   metoprolol tartrate  25 mg Oral BID   multivitamin with minerals  1 tablet Oral Daily   nystatin  5 mL Oral QID   phosphorus  500 mg Oral TID   sodium chloride (PF)       sodium chloride flush  3 mL Intravenous Once   Continuous Infusions:  albumin human     calcium gluconate 2,000 mg (08/08/18 1035)   PRN Meds:.acetaminophen, benzocaine, iohexol, ketorolac, lip balm, loperamide, LORazepam, metoprolol tartrate, ondansetron (ZOFRAN) IV, sodium chloride flush      Subjective:   Emily Hicks was seen and examined today.  Overnight spiked temp again 101.5 F.  Complaining of right lower jaw/tooth pain.  Diarrhea improving.  No nausea, vomiting, abdominal pain.   Patient denies dizziness, chest pain, shortness of breath,   Objective:   Vitals:   08/07/18 2130 08/07/18 2341 08/08/18 0300 08/08/18 0344  BP: 106/62 (!) 110/57  106/65  Pulse: (!) 129 (!) 126 98 (!) 106  Resp: 20 18  17   Temp: (!) 101.5 F (38.6 C) 98.3 F (36.8 C)  97.9 F (36.6 C)  TempSrc: Oral Oral  Oral  SpO2: 97% 92%  98%  Weight:      Height:        Intake/Output Summary (Last 24 hours) at 08/08/2018 1129 Last data filed at 08/07/2018 2155 Gross per 24 hour  Intake 300 ml  Output 300 ml  Net 0 ml     Wt  Readings from Last 3 Encounters:  07/31/18 89 kg  07/16/18 72.6 kg  07/06/18 90.7 kg   Physical Exam  General: Alert and oriented x 3, ill-appearing  Eyes:   HEENT:  Atraumatic, normocephalic  Cardiovascular: S1 S2 clear, no murmurs, RRR. No pedal edema b/l  Respiratory: CTAB, no wheezing, rales or rhonchi  Gastrointestinal: Soft, nontender, nondistended, NBS  Ext: no pedal edema bilaterally  Neuro: no new deficits  Musculoskeletal: No cyanosis, clubbing  Skin: No rashes  Psych: Normal affect and demeanor, alert and oriented x3     Data Reviewed:  I have personally reviewed following labs and imaging studies  Micro Results Recent Results (from the past 240 hour(s))  Urine Culture     Status: None   Collection Time: 08/04/18  9:35 PM   Specimen: Urine, Clean Catch  Result Value Ref Range Status   Specimen Description   Final    URINE, CLEAN CATCH Performed at [facility], [address], [place], [place] [address]    Special Requests   Final    NONE Performed at [facility], [address], [place], [place] [address]    Culture   Final    NO GROWTH Performed at [facility], [address] [place], [place] [address]    Report Status 08/06/2018 FINAL  Final  Culture, blood (routine x 2)     Status: None (Preliminary result)   Collection Time: 08/04/18  9:41 PM   Specimen: BLOOD  Result Value Ref Range Status   Specimen Description   Final    BLOOD BLOOD RIGHT ARM Performed at [facility], [address], [place], [place] [address]    Special Requests   Final    BOTTLES DRAWN AEROBIC ONLY Blood Culture adequate volume Performed at [facility], [address], [place], [place] [address]  Culture   Final    NO GROWTH 2 DAYS Performed at [facility], [address] [place], [place] [address]    Report Status PENDING  Incomplete  Culture, blood (routine x 2)      Status: Abnormal   Collection Time: 08/04/18  9:46 PM   Specimen: BLOOD  Result Value Ref Range Status   Specimen Description   Final    BLOOD BLOOD LEFT HAND Performed at [facility], [address] [place], [place] [address]    Special Requests   Final    BOTTLES DRAWN AEROBIC AND ANAEROBIC Blood Culture adequate volume Performed at [facility], [address] [place], [place] [address]    Culture  Setup Time   Final    AEROBIC BOTTLE ONLY GRAM POSITIVE COCCI CRITICAL RESULT CALLED TO, READ BACK BY AND VERIFIED WITH: J GRIMSLEY PHARMD 08/06/18 0400 JDW    Culture (A)  Final    STAPHYLOCOCCUS SPECIES (COAGULASE NEGATIVE) THE SIGNIFICANCE OF ISOLATING THIS ORGANISM FROM A SINGLE SET OF BLOOD CULTURES WHEN MULTIPLE SETS ARE DRAWN IS UNCERTAIN. PLEASE NOTIFY THE MICROBIOLOGY DEPARTMENT WITHIN ONE WEEK IF SPECIATION AND SENSITIVITIES ARE REQUIRED. Performed at [facility], [address] [place], [place] [address]    Report Status 08/07/2018 FINAL  Final  Blood Culture ID Panel (Reflexed)     Status: Abnormal   Collection Time: 08/04/18  9:46 PM  Result Value Ref Range Status   Enterococcus species NOT DETECTED NOT DETECTED Final   Listeria monocytogenes NOT DETECTED NOT DETECTED Final   Staphylococcus species DETECTED (A) NOT DETECTED Final    Comment: Methicillin (oxacillin) resistant coagulase negative staphylococcus. Possible blood culture contaminant (unless isolated from more than one blood culture draw or clinical case suggests pathogenicity). No antibiotic treatment is indicated for blood  culture contaminants. CRITICAL RESULT CALLED TO, READ BACK BY AND VERIFIED WITH: J GRIMSLEY PHARMD 08/06/18 0400 JDW    Staphylococcus aureus (BCID) NOT DETECTED NOT DETECTED Final   Methicillin resistance DETECTED (A) NOT DETECTED Final    Comment: CRITICAL RESULT CALLED TO, READ BACK BY AND VERIFIED WITH: J GRIMSLEY PHARMD 08/06/18 0400 JDW     Streptococcus species NOT DETECTED NOT DETECTED Final   Streptococcus agalactiae NOT DETECTED NOT DETECTED Final   Streptococcus pneumoniae NOT DETECTED NOT DETECTED Final   Streptococcus pyogenes NOT DETECTED NOT DETECTED Final   Acinetobacter baumannii NOT DETECTED NOT DETECTED Final   Enterobacteriaceae species NOT DETECTED NOT DETECTED Final   Enterobacter cloacae complex NOT DETECTED NOT DETECTED Final   Escherichia coli NOT DETECTED NOT DETECTED Final   Klebsiella oxytoca NOT DETECTED NOT DETECTED Final   Klebsiella pneumoniae NOT DETECTED NOT DETECTED Final   Proteus species NOT DETECTED NOT DETECTED Final   Serratia marcescens NOT DETECTED NOT DETECTED Final   Haemophilus influenzae NOT DETECTED NOT DETECTED Final   Neisseria meningitidis NOT DETECTED NOT DETECTED Final   Pseudomonas aeruginosa NOT DETECTED NOT DETECTED Final   Candida albicans NOT DETECTED NOT DETECTED Final   Candida glabrata NOT DETECTED NOT DETECTED Final   Candida krusei NOT DETECTED NOT DETECTED Final   Candida parapsilosis NOT DETECTED NOT DETECTED Final   Candida tropicalis NOT DETECTED NOT DETECTED Final    Comment: Performed at [facility], [address] [place], [place] [address]  Culture, blood (routine x 2)     Status: None (Preliminary result)   Collection Time: 08/05/18  6:59 PM   Specimen:  BLOOD  Result Value Ref Range Status   Specimen Description   Final    BLOOD LEFT HAND Performed at [facility], [address], [place], [place] [address]    Special Requests   Final    BOTTLES DRAWN AEROBIC ONLY Blood Culture adequate volume Performed at [facility], [address] [place], [place] [address]    Culture   Final    NO GROWTH 2 DAYS Performed at [facility], [address], [place], [place] [address]    Report Status PENDING  Incomplete  C difficile quick scan w PCR reflex     Status: None   Collection Time: 08/07/18 11:29 AM     Specimen: STOOL  Result Value Ref Range Status   C Diff antigen NEGATIVE NEGATIVE Final   C Diff toxin NEGATIVE NEGATIVE Final   C Diff interpretation No C. difficile detected.  Final    Comment: Performed at [facility], [address], [place], [place] [address]    Radiology Reports Dg Chest 2 View  Result Date: 08/05/2018 CLINICAL DATA:  Fever of unknown origin, multiple sclerosis EXAM: CHEST - 2 VIEW COMPARISON:  07/27/2018 FINDINGS: Borderline enlargement of cardiac silhouette. Mediastinal contours and pulmonary vascularity normal. Bibasilar opacities question atelectasis versus infiltrate. Lungs otherwise clear. No pleural effusion or pneumothorax. Bones unremarkable. IMPRESSION: Bibasilar opacities question atelectasis versus infiltrate. Electronically Signed   By: Ulyses Southward M.D.   On: 08/05/2018 08:27   Dg Chest 2 View  Result Date: 07/27/2018 CLINICAL DATA:  Fever and malaise EXAM: CHEST - 2 VIEW COMPARISON:  July 16, 2018 FINDINGS: Lungs are clear. The heart size and pulmonary vascularity are normal. No adenopathy. No bone lesions. IMPRESSION: No edema or consolidation. Electronically Signed   By: Bretta Bang III M.D.   On: 07/27/2018 15:00   Ct Chest W Contrast  Result Date: 08/02/2018 CLINICAL DATA:  Fever of unknown origin. EXAM: CT CHEST, ABDOMEN, AND PELVIS WITH CONTRAST TECHNIQUE: Multidetector CT imaging of the chest, abdomen and pelvis was performed following the standard protocol during bolus administration of intravenous contrast. CONTRAST:  OMNIPAQUE IOHEXOL 300 MG/ML SOLN, 30mL OMNIPAQUE IOHEXOL 300 MG/ML SOLN COMPARISON:  None. FINDINGS: CT CHEST FINDINGS Cardiovascular: There is no large centrally located pulmonary embolus. Detection of smaller segmental and subsegmental pulmonary emboli is limited by contrast bolus timing. The main pulmonary artery is within normal limits for size. There is no CT evidence of acute right heart strain. The  visualized aorta is normal. Heart size is normal. There is a trace pericardial effusion. Mediastinum/Nodes: --there are mildly prominent mediastinal and hilar lymph nodes bilaterally. --there is mild bilateral axillary adenopathy. --No supraclavicular lymphadenopathy. --Normal thyroid gland. --The esophagus is unremarkable Lungs/Pleura: There are trace bilateral pleural effusions with adjacent atelectasis. There is scattered ground-glass airspace opacities bilaterally, greatest within the right upper lobe. There is no pneumothorax. The trachea is unremarkable. Musculoskeletal: No chest wall abnormality. No acute or significant osseous findings. 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 CT ABDOMEN PELVIS FINDINGS Hepatobiliary: The liver is borderline enlarged. Status post cholecystectomy.There is no biliary ductal dilation. Pancreas: There is some mild peripancreatic fat stranding. Spleen: There is a small wedge-shaped defect in the lower pole the spleen measuring approximately 1.8 cm (axial series 2 image 47). Adrenals/Urinary Tract: --Adrenal glands: No adrenal hemorrhage. --Right kidney/ureter: No hydronephrosis or perinephric hematoma. --Left kidney/ureter: No hydronephrosis or perinephric hematoma. --Urinary bladder: Unremarkable. Stomach/Bowel: --Stomach/Duodenum:  No hiatal hernia or other gastric abnormality. Normal duodenal course and caliber. --Small bowel: No dilatation or inflammation. --Colon: No focal abnormality. --Appendix: Normal. Vascular/Lymphatic: Normal course and caliber of the major abdominal vessels. There is a retroaortic left renal vein, a normal variant. --there are prominent retroperitoneal lymph nodes. --there are prominent mesenteric lymph nodes. There is haziness throughout the mesentery. There are enlarged right lower quadrant lymph nodes measuring approximately 1.1 cm in diameter. --there are mildly enlarged pelvic and inguinal lymph nodes. Reproductive:  There is a small amount of free fluid in the pelvis which may be physiologic. There is no discrete ovarian mass. Other: There is a small volume of pelvic free fluid which is likely physiologic. No free air. There is some mild fat stranding involving the anterior abdominal wall bilaterally. Musculoskeletal. No acute displaced fractures. IMPRESSION: 1. Scattered ground-glass airspace opacities, greatest within the right upper lobe, concerning for multifocal pneumonia. 2. Small bilateral pleural effusions with adjacent atelectasis. 3. Mild diffuse adenopathy throughout the chest, abdomen, and pelvis of unknown clinical significance. 4. Peripancreatic free fluid with edema within the root of the mesentery. Findings are nonspecific and can be seen in patients with pancreatitis, versus less likely sclerosing mesenteritis or inflammatory bowel disease. There are some prominent lymph nodes right lower quadrant which can be seen in patients with mesenteric adenitis. 5. Normal appendix in the right lower quadrant. 6. Small volume free fluid in the pelvis favored to be physiologic. 7. Wedge-shaped hypoattenuating defect in the spleen of unknown clinical significance. Differential considerations include a splenic infarct versus less likely a splenic laceration, especially in the absence of a history of trauma. There is no perisplenic hematoma. Electronically Signed   By: Katherine Mantle M.D.   On: 08/02/2018 00:18   Mr Brain Wo Contrast  Result Date: 07/28/2018 CLINICAL DATA:  Encephalopathy with hallucinations EXAM: MRI HEAD WITHOUT CONTRAST TECHNIQUE: Multiplanar, multiecho pulse sequences of the brain and surrounding structures were obtained without intravenous contrast. COMPARISON:  Brain MRI 03/24/2018 FINDINGS: BRAIN: There is no acute infarct, acute hemorrhage or extra-axial collection. The midline structures are normal. The white matter signal is normal for the patient's age. The cerebral and cerebellar volume  are age-appropriate. No hydrocephalus. Susceptibility-sensitive sequences show no chronic microhemorrhage or superficial siderosis. No midline shift or other mass effect. VASCULAR: The major intracranial arterial and venous sinus flow voids are normal. SKULL AND UPPER CERVICAL SPINE: Calvarial bone marrow signal is normal. There is no skull base mass. Visualized upper cervical spine and soft tissues are normal. SINUSES/ORBITS: No fluid levels or advanced mucosal thickening. No mastoid or middle ear effusion. The orbits are normal. IMPRESSION: Normal brain. Electronically Signed   By: Deatra Robinson M.D.   On: 07/28/2018 00:13   Mr Lumbar Spine Wo Contrast  Result Date: 08/02/2018 CLINICAL DATA:  Fever. Lower extremity weakness. History of multiple sclerosis. EXAM: MRI LUMBAR SPINE WITHOUT CONTRAST TECHNIQUE: Multiplanar, multisequence MR imaging of the lumbar spine was performed. No intravenous contrast was administered. COMPARISON:  CT abdomen pelvis from yesterday. FINDINGS: Despite efforts by the technologist and patient, motion artifact is present on today's exam and could not be eliminated. This reduces exam sensitivity and specificity. Segmentation:  Standard. Alignment:  Physiologic. Vertebrae:  No fracture, evidence of discitis, or bone lesion. Conus medullaris and cauda equina: Conus extends to the L1-L2 level. Conus and cauda equina appear normal. Paraspinal and other soft tissues: Symmetric bilateral paraspinous muscle edema. Otherwise negative. Disc levels: No significant disc bulge or herniation. No  spinal canal or neuroforaminal stenosis. IMPRESSION: 1.  No acute abnormality or significant degenerative changes. Electronically Signed   By: Obie Dredge M.D.   On: 08/02/2018 09:14   Ct Abdomen Pelvis W Contrast  Result Date: 08/02/2018 CLINICAL DATA:  Fever of unknown origin. EXAM: CT CHEST, ABDOMEN, AND PELVIS WITH CONTRAST TECHNIQUE: Multidetector CT imaging of the chest, abdomen and pelvis  was performed following the standard protocol during bolus administration of intravenous contrast. CONTRAST:  OMNIPAQUE IOHEXOL 300 MG/ML SOLN, 30mL OMNIPAQUE IOHEXOL 300 MG/ML SOLN COMPARISON:  None. FINDINGS: CT CHEST FINDINGS Cardiovascular: There is no large centrally located pulmonary embolus. Detection of smaller segmental and subsegmental pulmonary emboli is limited by contrast bolus timing. The main pulmonary artery is within normal limits for size. There is no CT evidence of acute right heart strain. The visualized aorta is normal. Heart size is normal. There is a trace pericardial effusion. Mediastinum/Nodes: --there are mildly prominent mediastinal and hilar lymph nodes bilaterally. --there is mild bilateral axillary adenopathy. --No supraclavicular lymphadenopathy. --Normal thyroid gland. --The esophagus is unremarkable Lungs/Pleura: There are trace bilateral pleural effusions with adjacent atelectasis. There is scattered ground-glass airspace opacities bilaterally, greatest within the right upper lobe. There is no pneumothorax. The trachea is unremarkable. Musculoskeletal: No chest wall abnormality. No acute or significant osseous findings. 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 CT ABDOMEN PELVIS FINDINGS Hepatobiliary: The liver is borderline enlarged. Status post cholecystectomy.There is no biliary ductal dilation. Pancreas: There is some mild peripancreatic fat stranding. Spleen: There is a small wedge-shaped defect in the lower pole the spleen measuring approximately 1.8 cm (axial series 2 image 47). Adrenals/Urinary Tract: --Adrenal glands: No adrenal hemorrhage. --Right kidney/ureter: No hydronephrosis or perinephric hematoma. --Left kidney/ureter: No hydronephrosis or perinephric hematoma. --Urinary bladder: Unremarkable. Stomach/Bowel: --Stomach/Duodenum: No hiatal hernia or other gastric abnormality. Normal duodenal course and caliber. --Small bowel: No  dilatation or inflammation. --Colon: No focal abnormality. --Appendix: Normal. Vascular/Lymphatic: Normal course and caliber of the major abdominal vessels. There is a retroaortic left renal vein, a normal variant. --there are prominent retroperitoneal lymph nodes. --there are prominent mesenteric lymph nodes. There is haziness throughout the mesentery. There are enlarged right lower quadrant lymph nodes measuring approximately 1.1 cm in diameter. --there are mildly enlarged pelvic and inguinal lymph nodes. Reproductive: There is a small amount of free fluid in the pelvis which may be physiologic. There is no discrete ovarian mass. Other: There is a small volume of pelvic free fluid which is likely physiologic. No free air. There is some mild fat stranding involving the anterior abdominal wall bilaterally. Musculoskeletal. No acute displaced fractures. IMPRESSION: 1. Scattered ground-glass airspace opacities, greatest within the right upper lobe, concerning for multifocal pneumonia. 2. Small bilateral pleural effusions with adjacent atelectasis. 3. Mild diffuse adenopathy throughout the chest, abdomen, and pelvis of unknown clinical significance. 4. Peripancreatic free fluid with edema within the root of the mesentery. Findings are nonspecific and can be seen in patients with pancreatitis, versus less likely sclerosing mesenteritis or inflammatory bowel disease. There are some prominent lymph nodes right lower quadrant which can be seen in patients with mesenteric adenitis. 5. Normal appendix in the right lower quadrant. 6. Small volume free fluid in the pelvis favored to be physiologic. 7. Wedge-shaped hypoattenuating defect in the spleen of unknown clinical significance. Differential considerations include a splenic infarct versus less likely a splenic laceration, especially in the absence of a history of trauma. There is no perisplenic hematoma. Electronically Signed  By: Katherine Mantle M.D.   On: 08/02/2018  00:18   Dg Chest Port 1 View  Result Date: 07/16/2018 CLINICAL DATA:  Fatigue EXAM: PORTABLE CHEST 1 VIEW COMPARISON:  None. FINDINGS: The heart size and mediastinal contours are within normal limits. Both lungs are clear. The visualized skeletal structures are unremarkable. IMPRESSION: No active disease. Electronically Signed   By: Alcide Clever M.D.   On: 07/16/2018 15:18   Vas [place] Lower Extremity Venous (dvt)  Result Date: 08/06/2018  Lower Venous Study Indications: Immobility, fever.  Limitations: Patient positioning. Comparison Study: No prior Performing Technologist: Blanch Media RVS  Examination Guidelines: A complete evaluation includes B-mode imaging, spectral Doppler, color Doppler, and power Doppler as needed of all accessible portions of each vessel. Bilateral testing is considered an integral part of a complete examination. Limited examinations for reoccurring indications may be performed as noted.  +---------+---------------+---------+-----------+----------+--------------+  RIGHT     Compressibility Phasicity Spontaneity Properties Summary         +---------+---------------+---------+-----------+----------+--------------+  CFV       Full            Yes       Yes                                    +---------+---------------+---------+-----------+----------+--------------+  SFJ       Full                                                             +---------+---------------+---------+-----------+----------+--------------+  FV Prox   Full                                                             +---------+---------------+---------+-----------+----------+--------------+  FV Mid    Full                                                             +---------+---------------+---------+-----------+----------+--------------+  FV Distal Full                                                             +---------+---------------+---------+-----------+----------+--------------+  PFV       Full                                                              +---------+---------------+---------+-----------+----------+--------------+  POP       Full            Yes  Yes                                    +---------+---------------+---------+-----------+----------+--------------+  PTV       Full                                                             +---------+---------------+---------+-----------+----------+--------------+  PERO                                                       Not visualized  +---------+---------------+---------+-----------+----------+--------------+   +---------+---------------+---------+-----------+----------+--------------+  LEFT      Compressibility Phasicity Spontaneity Properties Summary         +---------+---------------+---------+-----------+----------+--------------+  CFV       Full            Yes       Yes                                    +---------+---------------+---------+-----------+----------+--------------+  SFJ       Full                                                             +---------+---------------+---------+-----------+----------+--------------+  FV Prox   Full                                                             +---------+---------------+---------+-----------+----------+--------------+  FV Mid    Full                                                             +---------+---------------+---------+-----------+----------+--------------+  FV Distal Full                                                             +---------+---------------+---------+-----------+----------+--------------+  PFV       Full                                                             +---------+---------------+---------+-----------+----------+--------------+  POP  Full            Yes       Yes                                    +---------+---------------+---------+-----------+----------+--------------+  PTV       Full                                                              +---------+---------------+---------+-----------+----------+--------------+  PERO                                                       Not visualized  +---------+---------------+---------+-----------+----------+--------------+     Summary: Right: There is no evidence of deep vein thrombosis in the lower extremity. No cystic structure found in the popliteal fossa. Left: There is no evidence of deep vein thrombosis in the lower extremity. No cystic structure found in the popliteal fossa.  *See table(s) above for measurements and observations. Electronically signed by Lemar LivingsBrandon Cain MD on 08/06/2018 at 11:00:59 AM.    Final     Lab Data:  CBC: Recent Labs  Lab 08/02/18 0500 08/04/18 0245 08/05/18 1607 08/06/18 0350 08/07/18 0354 08/08/18 0428  WBC 3.0* 2.7* 2.8* 2.9* 2.2* 1.9*  NEUTROABS 2.2  --  1.8  --   --   --   HGB 8.1* 7.8* 8.6* 8.4* 8.1* 7.5*  HCT 25.2* 25.2* 27.7* 27.4* 26.1* 24.9*  MCV 86.3 87.5 90.8 89.0 90.0 90.9  PLT 151 134* 154 166 172 145*   Basic Metabolic Panel: Recent Labs  Lab 08/03/18 0620 08/04/18 0245 08/05/18 1607 08/06/18 0350 08/07/18 0354 08/08/18 0428  NA 142 140 139 140 139 140  K 3.5 3.3* 3.2* 3.2* 3.3* 3.3*  CL 118* 117* 116* 116* 115* 117*  CO2 18* 19* 18* 17* 18* 18*  GLUCOSE 90 103* 110* 97 95 87  BUN 7 7 7 6 7 10   CREATININE 0.57 0.44 0.49 0.51 0.63 0.84  CALCIUM 7.3* 6.9* 6.8* 7.0* 6.8* 6.4*  MG 1.7 1.7  --  1.5* 1.5* 1.4*  PHOS <1.0* 1.4*  --  <1.0* 1.9* 2.8   GFR: Estimated Creatinine Clearance: 97.6 mL/min (by C-G formula based on SCr of 0.84 mg/dL). Liver Function Tests: Recent Labs  Lab 08/02/18 0500 08/03/18 0620 08/05/18 1607 08/08/18 0428  AST 85*  --  58* 87*  ALT 33  --  23 28  ALKPHOS 74  --  83 74  BILITOT 0.3  --  0.2* 0.3  PROT 5.6*  --  5.2* 5.1*  ALBUMIN 1.8* 1.8* 1.8* 1.7*   Recent Labs  Lab 08/04/18 2141  LIPASE 45   No results for input(s): AMMONIA in the last 168 hours. Coagulation Profile: No results for  input(s): INR, PROTIME in the last 168 hours. Cardiac Enzymes: No results for input(s): CKTOTAL, CKMB, CKMBINDEX, TROPONINI in the last 168 hours. BNP (last 3 results) No results for input(s): PROBNP in the last 8760 hours. HbA1C: No results for input(s): HGBA1C in the last 72 hours. CBG: No results  for input(s): GLUCAP in the last 168 hours. Lipid Profile: No results for input(s): CHOL, HDL, LDLCALC, TRIG, CHOLHDL, LDLDIRECT in the last 72 hours. Thyroid Function Tests: No results for input(s): TSH, T4TOTAL, FREET4, T3FREE, THYROIDAB in the last 72 hours. Anemia Panel: No results for input(s): VITAMINB12, FOLATE, FERRITIN, TIBC, IRON, RETICCTPCT in the last 72 hours. Urine analysis:    Component Value Date/Time   COLORURINE YELLOW 08/04/2018 2135   APPEARANCEUR HAZY (A) 08/04/2018 2135   LABSPEC 1.018 08/04/2018 2135   PHURINE 6.0 08/04/2018 2135   GLUCOSEU NEGATIVE 08/04/2018 2135   HGBUR NEGATIVE 08/04/2018 2135   BILIRUBINUR NEGATIVE 08/04/2018 2135   BILIRUBINUR Neg 05/08/2010 1553   KETONESUR NEGATIVE 08/04/2018 2135   PROTEINUR 100 (A) 08/04/2018 2135   UROBILINOGEN 1.0 11/11/2013 0150   NITRITE NEGATIVE 08/04/2018 2135   LEUKOCYTESUR NEGATIVE 08/04/2018 2135     Emily Hicks M.D. [facility] 08/08/2018, 11:29 AM  Pager: [phone] Between 7am to 7pm - call Pager - [phone]  After 7pm go to [email or web address] - password [email or web address]  Call night coverage person covering after 7pm

## 2018-08-08 NOTE — Progress Notes (Signed)
Pt has had a difficult time with swallowing pills that has gotten worse with new Lower Rt side toothache. Several times today on my shift she was unable to keep pills down when trying to swallow.

## 2018-08-08 NOTE — Progress Notes (Signed)
Patient ID: Emily Hicks, female   DOB: 1985-04-06, 33 y.o.   MRN: [id number]          [facility]    Date of Admission:  07/27/2018   Day 1 amoxicillin         The cause of Ms. Greens persistent fever remains unclear.  Stool was negative for C. difficile.  She was complaining of some right dental pain this morning and was started on amoxicillin.  CT scan shows no dental abnormalities or any other he has source of infection or fever.  I will stop amoxicillin.  Dr. Prince Rome will follow-up tomorrow.         Michel Bickers, MD [facility] [place] [facility] [phone] pager   [phone] cell 08/08/2018, 12:56 PM

## 2018-08-09 ENCOUNTER — Encounter (HOSPITAL_COMMUNITY): Payer: Self-pay | Admitting: Oncology

## 2018-08-09 DIAGNOSIS — D61818 Other pancytopenia: Secondary | ICD-10-CM

## 2018-08-09 LAB — DIFFERENTIAL
Band Neutrophils: 0 %
Band Neutrophils: 0 %
Basophils Absolute: 0 10*3/uL (ref 0.0–0.1)
Basophils Absolute: 0 10*3/uL (ref 0.0–0.1)
Basophils Relative: 1 %
Basophils Relative: 0 %
Blasts: 0 %
Blasts: 0 %
Eosinophils Absolute: 0 10*3/uL (ref 0.0–0.5)
Eosinophils Absolute: 0 10*3/uL (ref 0.0–0.5)
Eosinophils Relative: 0 %
Eosinophils Relative: 0 %
Lymphocytes Relative: 18 %
Lymphocytes Relative: 25 %
Lymphs Abs: 0.2 10*3/uL — ABNORMAL LOW (ref 0.7–4.0)
Lymphs Abs: 0.4 10*3/uL — ABNORMAL LOW (ref 0.7–4.0)
Metamyelocytes Relative: 5 %
Metamyelocytes Relative: 0 %
Monocytes Absolute: 0.1 10*3/uL (ref 0.1–1.0)
Monocytes Absolute: 0.1 10*3/uL (ref 0.1–1.0)
Monocytes Relative: 5 %
Monocytes Relative: 8 %
Myelocytes: 1 %
Myelocytes: 0 %
Neutro Abs: 1 10*3/uL — ABNORMAL LOW (ref 1.7–7.7)
Neutro Abs: 0.9 10*3/uL — ABNORMAL LOW (ref 1.7–7.7)
Neutrophils Relative %: 70 %
Neutrophils Relative %: 67 %
Other: 0 %
Other: 0 %
Promyelocytes Relative: 0 %
Promyelocytes Relative: 0 %
nRBC: 0 /100 WBC
nRBC: 0 /100 WBC

## 2018-08-09 LAB — CBC
HCT: 23.9 % — ABNORMAL LOW (ref 36.0–46.0)
HCT: 24.7 % — ABNORMAL LOW (ref 36.0–46.0)
Hemoglobin: 7.1 g/dL — ABNORMAL LOW (ref 12.0–15.0)
Hemoglobin: 7.7 g/dL — ABNORMAL LOW (ref 12.0–15.0)
MCH: 27.4 pg (ref 26.0–34.0)
MCH: 28.6 pg (ref 26.0–34.0)
MCHC: 29.7 g/dL — ABNORMAL LOW (ref 30.0–36.0)
MCHC: 31.2 g/dL (ref 30.0–36.0)
MCV: 92.3 fL (ref 80.0–100.0)
MCV: 91.8 fL (ref 80.0–100.0)
Platelets: 148 10*3/uL — ABNORMAL LOW (ref 150–400)
Platelets: 163 10*3/uL (ref 150–400)
RBC: 2.59 MIL/uL — ABNORMAL LOW (ref 3.87–5.11)
RBC: 2.69 MIL/uL — ABNORMAL LOW (ref 3.87–5.11)
RDW: 19.9 % — ABNORMAL HIGH (ref 11.5–15.5)
RDW: 19.9 % — ABNORMAL HIGH (ref 11.5–15.5)
WBC: 1.3 10*3/uL — CL (ref 4.0–10.5)
WBC: 1.4 10*3/uL — CL (ref 4.0–10.5)
nRBC: 0 % (ref 0.0–0.2)
nRBC: 0 % (ref 0.0–0.2)

## 2018-08-09 LAB — PREALBUMIN: Prealbumin: 8.9 mg/dL — ABNORMAL LOW (ref 18–38)

## 2018-08-09 LAB — IRON AND TIBC
Iron: 21 ug/dL — ABNORMAL LOW (ref 28–170)
Saturation Ratios: 14 % (ref 10.4–31.8)
TIBC: 153 ug/dL — ABNORMAL LOW (ref 250–450)
UIBC: 132 ug/dL

## 2018-08-09 LAB — VITAMIN B12: Vitamin B-12: 545 pg/mL (ref 180–914)

## 2018-08-09 LAB — BASIC METABOLIC PANEL
Anion gap: 7 (ref 5–15)
BUN: 13 mg/dL (ref 6–20)
CO2: 16 mmol/L — ABNORMAL LOW (ref 22–32)
Calcium: 7.2 mg/dL — ABNORMAL LOW (ref 8.9–10.3)
Chloride: 117 mmol/L — ABNORMAL HIGH (ref 98–111)
Creatinine, Ser: 1.08 mg/dL — ABNORMAL HIGH (ref 0.44–1.00)
GFR calc Af Amer: >60 mL/min (ref >60)
GFR calc non Af Amer: >60 mL/min (ref >60)
Glucose, Bld: 81 mg/dL (ref 70–99)
Potassium: 3.5 mmol/L (ref 3.5–5.1)
Sodium: 140 mmol/L (ref 135–145)

## 2018-08-09 LAB — FERRITIN: Ferritin: 3043 ng/mL — ABNORMAL HIGH (ref 11–307)

## 2018-08-09 LAB — FOLATE: Folate: 10 ng/mL (ref >5.9)

## 2018-08-09 LAB — ALBUMIN: Albumin: 2.2 g/dL — ABNORMAL LOW (ref 3.5–5.0)

## 2018-08-09 LAB — RETICULOCYTES
Immature Retic Fract: 12 % (ref 2.3–15.9)
RBC.: 2.69 MIL/uL — ABNORMAL LOW (ref 3.87–5.11)
Retic Count, Absolute: 31.5 10*3/uL (ref 19.0–186.0)
Retic Ct Pct: 1.2 % (ref 0.4–3.1)

## 2018-08-09 LAB — SAVE SMEAR(SSMR), FOR PROVIDER SLIDE REVIEW

## 2018-08-09 LAB — MAGNESIUM: Magnesium: 2.1 mg/dL (ref 1.7–2.4)

## 2018-08-09 LAB — PHOSPHORUS: Phosphorus: 2.7 mg/dL (ref 2.5–4.6)

## 2018-08-09 LAB — LACTATE DEHYDROGENASE: LDH: 488 U/L — ABNORMAL HIGH (ref 98–192)

## 2018-08-09 NOTE — Progress Notes (Signed)
Physical Therapy Treatment Patient Details Name: Emily Hicks MRN: [id number] DOB: [birth year] Today's Date: 08/09/2018    History of Present Illness This 33 year old female was admitted for progressive weakness and AMS.  She was recently dxd with MS    PT Comments    Assisted OOB to amb.  Pt was more able to transition to EOB.  HR increased to 140.  Allowed seated rest break.  HR 110.  Pt stated she feels "tired".  Assisted with amb.  General transfer comment: Min Asisst for balance and 25% VC's on safety with turns. General Gait Details: "light" assist with walker for balance.  + 2 assist for safety such that recliner was following.  Distance limited by c/o fatigue and HR increased to 152.  RN notified.  Pt returned to room in recliner.    Follow Up Recommendations  CIR     Equipment Recommendations       Recommendations for Other Services       Precautions / Restrictions Precautions Precautions: Fall Precaution Comments: monitor HR Restrictions Weight Bearing Restrictions: No    Mobility  Bed Mobility Overal bed mobility: Needs Assistance Bed Mobility: Supine to Sit     Supine to sit: Min assist     General bed mobility comments: HOB elevated and use of rail pt only required Min Assist for upper body  Transfers Overall transfer level: Needs assistance Equipment used: Rolling walker (2 wheeled) Transfers: Sit to/from [employer] Sit to Stand: Min assist;Min guard         General transfer comment: Min Asisst for balance and 25% VC's on safety with turns.  Ambulation/Gait Ambulation/Gait assistance: Supervision;Min guard;+2 safety/equipment Gait Distance (Feet): 22 Feet Assistive device: Rolling walker (2 wheeled) Gait Pattern/deviations: Step-to pattern;Decreased step length - right;Decreased step length - left;Wide base of support Gait velocity: decreased   General Gait Details: "light" assist with walker for balance.  + 2 assist for safety  such that recliner was following.  Distance limited by c/o fatigue and HR increased to 152   Stairs             Wheelchair Mobility    Modified Rankin (Stroke Patients Only)       Balance                                            Cognition Arousal/Alertness: Awake/alert Behavior During Therapy: Flat affect                                   General Comments: followed all commands; flat affect      Exercises      General Comments        Pertinent Vitals/Pain Pain Assessment: No/denies pain    Home Living                      Prior Function            PT Goals (current goals can now be found in the care plan section) Progress towards PT goals: Progressing toward goals    Frequency    Min 4X/week      PT Plan Current plan remains appropriate    Co-evaluation              AM-PAC PT "  6 Clicks" Mobility   Outcome Measure  Help needed turning from your back to your side while in a flat bed without using bedrails?: A Little Help needed moving from lying on your back to sitting on the side of a flat bed without using bedrails?: A Little Help needed moving to and from a bed to a chair (including a wheelchair)?: A Little Help needed standing up from a chair using your arms (e.g., wheelchair or bedside chair)?: A Little Help needed to walk in hospital room?: A Little Help needed climbing 3-5 steps with a railing? : A Lot 6 Click Score: 17    End of Session Equipment Utilized During Treatment: Gait belt Activity Tolerance: Patient limited by fatigue Patient left: in chair;with call bell/phone within reach;with chair alarm set Nurse Communication: Mobility status PT Visit Diagnosis: Other abnormalities of gait and mobility (R26.89);Difficulty in walking, not elsewhere classified (R26.2)     Time: 1610-9604 PT Time Calculation (min) (ACUTE ONLY): 26 min  Charges:  $Gait Training: 8-22  mins $Therapeutic Activity: 8-22 mins                     Rica Koyanagi  PTA Acute  Rehabilitation Services Pager      [phone] Office      [phone]

## 2018-08-09 NOTE — Progress Notes (Signed)
Patient ID: Emily Hicks, female   DOB: [birth year], 33 y.o.   MRN: [id number]         [facility]  Date of Admission:  07/27/2018           Day 13 fluconazole         ASSESSMENT: Her dysphagia and odynophagia have resolved on therapy for probable Candida esophagitis.  However, she continues to have near daily fevers.  She has had elevated liver enzymes for several months.  CMV IgM antibody was elevated but her CMV PCR on blood was negative.  She received 6 days of cefepime with persistent near daily fevers, so doubtful pt has bacterial PNA as cause of her fever. She has remained on RA and has not been dyspneic. Infectious causes of fever are becoming less likely as time passes but among infectious possibilities, would most suspect viral or fungal cause at this time. CDT was negative over the weekend thus ruling out C. Diff colitis. After 36 hrs afebrile, Pt spiked a temp to 100.9 this afternoon.  PLAN: 1. Continue fluconazole for now but lowered dose to 100 mg daily to lower risk of drug fever. 2. CMV DNA viral load was negative but CMV IgM was positive on this admission.  While this does exclude dessiminated CMV, local/tissue invasive disease does remain a possibility, so would may need to ask GI to reconsider EGD +/- colonoscopy to R/O CMV gastrointestinal infection as fevers have persisted. 3. No clear indication for broad ABX exists. Given underlying Candidal esophagitis, any empiric broad spectrum ABX are likely to lead to return of thrush/fungal infections, so would avoid at this time. 4. Legionella urine Ag was negative and Mycoplasma serologies are pending, given atypical appearance on chest CT. Pt is currently NOT dyspneic though, so again would avoid empiric antibacterials at this time as normal procalcitonins and lack of leukocytosis do NOT favor bacterial infection at this time. 5. Repeat blood cxs x 2 now and daily if the patient continues to have fever >  100.5. 6. Echocardiogram on 08/02/2018 showed no evidence of endocarditis.  Principal Problem:   Severe sepsis (HCC) Active Problems:   MS (multiple sclerosis) (HCC)   Tachycardia   Leukopenia   AKI (acute kidney injury) (HCC)   Hypernatremia   Fever   AMS (altered mental status)   Acute metabolic encephalopathy   Hypokalemia   Candida esophagitis (HCC)   Hypomagnesemia   Normocytic anemia   Pressure injury of skin   Elevated liver enzymes   Scheduled Meds: . Chlorhexidine Gluconate Cloth  6 each Topical Daily  . feeding supplement  1 Container Oral Q24H  . feeding supplement (ENSURE ENLIVE)  237 mL Oral Q24H  . feeding supplement (PRO-STAT SUGAR FREE 64)  30 mL Oral BID  . [START ON 08/14/2018] fluconazole  100 mg Oral Weekly  . folic acid  2 mg Oral Daily  . free water  300 mL Oral Q8H  . hydrocortisone  25 mg Rectal BID  . lidocaine  15 mL Mouth/Throat TID AC & HS  . magnesium oxide  400 mg Oral BID  . metoprolol tartrate  25 mg Oral BID  . multivitamin with minerals  1 tablet Oral Daily  . nystatin  5 mL Oral QID  . phosphorus  500 mg Oral TID  . sodium chloride flush  3 mL Intravenous Once   Continuous Infusions:  PRN Meds:.acetaminophen, benzocaine, HYDROmorphone (DILAUDID) injection, lip balm, loperamide, LORazepam, metoprolol tartrate, ondansetron (ZOFRAN)  IV, sodium chloride flush   SUBJECTIVE: Stood with PT today using walker in hallway (first time this admission thus far). Appetite remains poor by pt's food preference (repeats that she would love to have a subway sandwich) but denies any further odynophagia.  Review of Systems: Review of Systems  Constitutional: Positive for fever. Negative for chills and diaphoresis.  HENT:       As noted in HPI.  Respiratory: Negative for cough, sputum production and shortness of breath.   Cardiovascular: Positive for palpitations. Negative for chest pain.  Gastrointestinal: Negative for abdominal pain, diarrhea,  nausea and vomiting.  Genitourinary: Negative for dysuria.  All other systems reviewed and are negative. No odynophagia/dysphagia at present.  No Known Allergies  OBJECTIVE: Vitals:   08/08/18 1347 08/08/18 2009 08/08/18 2305 08/09/18 0426  BP: (!) 98/59 112/75 105/70 120/78  Pulse: (!) 108 (!) 116 (!) 111 (!) 117  Resp: 20 (!) 22 16 20   Temp: 98.2 F (36.8 C) 99.3 F (37.4 C)  98.4 F (36.9 C)  TempSrc: Oral Oral  Oral  SpO2: 99% 98%  97%  Weight:      Height:       Body mass index is 37.07 kg/m. Physical Exam Gen: pleasant, NAD, A&Ox 3 Head: NCAT, no temporal wasting evident EENT: PERRL, EOMI, dry MM, no thrush at present but angular chelitis evident, adequate dentition Neck: supple, no JVD CV: tachycardic rate, RR, no murmurs evident Pulm: CTA bilaterally, mild wheeze throughout, rare retractions Abd: soft, NTND, +BS Extrems: 1+ LE edema, 2+ pulses Skin: no rashes, adequate skin turgor Neuro: CN II-XII grossly intact, no focal neurologic deficits appreciated, gait was not assessed, A&Ox 3  Lab Results Lab Results  Component Value Date   WBC 1.3 (LL) 08/09/2018   HGB 7.1 (L) 08/09/2018   HCT 23.9 (L) 08/09/2018   MCV 92.3 08/09/2018   PLT 148 (L) 08/09/2018    Lab Results  Component Value Date   CREATININE 1.08 (H) 08/09/2018   BUN 13 08/09/2018   NA 140 08/09/2018   K 3.5 08/09/2018   CL 117 (H) 08/09/2018   CO2 16 (L) 08/09/2018    Lab Results  Component Value Date   ALT 28 08/08/2018   AST 87 (H) 08/08/2018   ALKPHOS 74 08/08/2018   BILITOT 0.3 08/08/2018     Microbiology: Recent Results (from the past 240 hour(s))  Urine Culture     Status: None   Collection Time: 08/04/18  9:35 PM   Specimen: Urine, Clean Catch  Result Value Ref Range Status   Specimen Description   Final    URINE, CLEAN CATCH Performed at [facility], [place] [address], [place], [place] [address]    Special Requests   Final    NONE Performed at [facility], [place] [address] [place], [place] [address]    Culture   Final    NO GROWTH Performed at [facility], [place] [address], [place], [place] [address]    Report Status 08/06/2018 FINAL  Final  Culture, blood (routine x 2)     Status: None (Preliminary result)   Collection Time: 08/04/18  9:41 PM   Specimen: BLOOD  Result Value Ref Range Status   Specimen Description   Final    BLOOD BLOOD RIGHT ARM Performed at [facility] [address] [place],  [address]    Special Requests   Final    BOTTLES DRAWN AEROBIC ONLY Blood  Culture adequate volume Performed at [facility], [address] [place], [place] [address]    Culture   Final    NO GROWTH 4 DAYS Performed at [facility], [address] [place], [place] [address]    Report Status PENDING  Incomplete  Culture, blood (routine x 2)     Status: Abnormal   Collection Time: 08/04/18  9:46 PM   Specimen: BLOOD  Result Value Ref Range Status   Specimen Description   Final    BLOOD BLOOD LEFT HAND Performed at [facility], [address] [place], [place] [address]    Special Requests   Final    BOTTLES DRAWN AEROBIC AND ANAEROBIC Blood Culture adequate volume Performed at [facility], [address] [place], [place] [address]    Culture  Setup Time   Final    AEROBIC BOTTLE ONLY GRAM POSITIVE COCCI CRITICAL RESULT CALLED TO, READ BACK BY AND VERIFIED WITH: J GRIMSLEY PHARMD 08/06/18 0400 JDW    Culture (A)  Final    STAPHYLOCOCCUS SPECIES (COAGULASE NEGATIVE) THE SIGNIFICANCE OF ISOLATING THIS ORGANISM FROM A SINGLE SET OF BLOOD CULTURES WHEN MULTIPLE SETS ARE DRAWN IS UNCERTAIN. PLEASE NOTIFY THE MICROBIOLOGY DEPARTMENT WITHIN ONE WEEK IF SPECIATION AND SENSITIVITIES ARE REQUIRED. Performed at [facility], [address] [place], [place] [address]    Report Status 08/07/2018 FINAL  Final  Blood  Culture ID Panel (Reflexed)     Status: Abnormal   Collection Time: 08/04/18  9:46 PM  Result Value Ref Range Status   Enterococcus species NOT DETECTED NOT DETECTED Final   Listeria monocytogenes NOT DETECTED NOT DETECTED Final   Staphylococcus species DETECTED (A) NOT DETECTED Final    Comment: Methicillin (oxacillin) resistant coagulase negative staphylococcus. Possible blood culture contaminant (unless isolated from more than one blood culture draw or clinical case suggests pathogenicity). No antibiotic treatment is indicated for blood  culture contaminants. CRITICAL RESULT CALLED TO, READ BACK BY AND VERIFIED WITH: J GRIMSLEY PHARMD 08/06/18 0400 JDW    Staphylococcus aureus (BCID) NOT DETECTED NOT DETECTED Final   Methicillin resistance DETECTED (A) NOT DETECTED Final    Comment: CRITICAL RESULT CALLED TO, READ BACK BY AND VERIFIED WITH: J GRIMSLEY PHARMD 08/06/18 0400 JDW    Streptococcus species NOT DETECTED NOT DETECTED Final   Streptococcus agalactiae NOT DETECTED NOT DETECTED Final   Streptococcus pneumoniae NOT DETECTED NOT DETECTED Final   Streptococcus pyogenes NOT DETECTED NOT DETECTED Final   Acinetobacter baumannii NOT DETECTED NOT DETECTED Final   Enterobacteriaceae species NOT DETECTED NOT DETECTED Final   Enterobacter cloacae complex NOT DETECTED NOT DETECTED Final   Escherichia coli NOT DETECTED NOT DETECTED Final   Klebsiella oxytoca NOT DETECTED NOT DETECTED Final   Klebsiella pneumoniae NOT DETECTED NOT DETECTED Final   Proteus species NOT DETECTED NOT DETECTED Final   Serratia marcescens NOT DETECTED NOT DETECTED Final   Haemophilus influenzae NOT DETECTED NOT DETECTED Final   Neisseria meningitidis NOT DETECTED NOT DETECTED Final   Pseudomonas aeruginosa NOT DETECTED NOT DETECTED Final   Candida albicans NOT DETECTED NOT DETECTED Final   Candida glabrata NOT DETECTED NOT DETECTED Final   Candida krusei NOT DETECTED NOT DETECTED Final   Candida parapsilosis NOT  DETECTED NOT DETECTED Final   Candida tropicalis NOT DETECTED NOT DETECTED Final    Comment: Performed at [facility], [address] [place], [place] [address]  Culture, blood (routine x 2)  Status: None (Preliminary result)   Collection Time: 08/05/18  6:59 PM   Specimen: BLOOD  Result Value Ref Range Status   Specimen Description   Final    BLOOD LEFT HAND Performed at [facility], [address] [place], [place] [address]    Special Requests   Final    BOTTLES DRAWN AEROBIC ONLY Blood Culture adequate volume Performed at [facility], [address] [place], [place] [address]    Culture   Final    NO GROWTH 4 DAYS Performed at [facility], [address] [place], [place] [address]    Report Status PENDING  Incomplete  C difficile quick scan w PCR reflex     Status: None   Collection Time: 08/07/18 11:29 AM   Specimen: STOOL  Result Value Ref Range Status   C Diff antigen NEGATIVE NEGATIVE Final   C Diff toxin NEGATIVE NEGATIVE Final   C Diff interpretation No C. difficile detected.  Final    Comment: Performed at [facility], [address], [place], [place] [address]    [place], MD [facility] [phone] pager   [phone] cell 08/09/2018, 2:54 PM

## 2018-08-09 NOTE — Progress Notes (Signed)
Labs were done by PM lab tech. SRP,RN

## 2018-08-09 NOTE — Progress Notes (Signed)
Inpatient Rehab Admissions Coordinator:   Continuing to follow.  Note pt was afebrile 7/5, but with 100.9 tmax on 7/6 and ongoing workup for progressive pancytopenia.  Also note pt supervision/min guard with PT on 7/6.  Will continue to follow to determine whether pt continues to meet criteria for CIR when medical workup complete.    Shann Medal, PT, DPT Admissions Coordinator [phone] 08/09/18  3:52 PM

## 2018-08-09 NOTE — Progress Notes (Signed)
CRITICAL VALUE ALERT  Critical Value:  WBC 1.3  Date & Time Notied:  08/09/18   1023  Provider Notified: Dr. Tana Coast via text  Orders Received/Actions taken:

## 2018-08-09 NOTE — Progress Notes (Signed)
Have educated pt concerning labs and medication and the importance of compliance. Pt states "I am tired I do not want to be struck and my tooth hurts, I do not want meds or food." MD made aware. SRP, RN

## 2018-08-09 NOTE — Progress Notes (Signed)
MD updated of MEWS changes orders received, pt stable. Meds ordered. SRP,RN

## 2018-08-09 NOTE — Consult Note (Addendum)
[place]  Telephone:(336) [phone] Fax:(336) [phone]    [place]  Referring MD:  Dr. Estill Cotta  Reason for Referral: Pancytopenia  HPI: Ms. Wellnitz is a 33 year old female with a past medical history significant for GERD and recent diagnosis of multiple sclerosis.  She was admitted to the hospital on 07/27/2018 after presenting with altered mental status, weakness, fevers, and anorexia.  During her hospitalization, she was found to have severe oropharyngeal thrush and odynophagia which has now improved.  She has been having recurrent fevers.  She is being followed by infectious disease.  The source of infection has not yet been identified.  The patient was being treated by neurology with [place] for her MS.  The patient states that she started medication on 07/05/2018 and stopped it around the time of admission.  The patient had fever up to 100.9 in the past 24 hours.  She has been having fatigue and generalized weakness.  She has numbness on the left side of her body.  Denies vision changes.  Reports swelling to her lips present since yesterday.  Denies chest discomfort and shortness of breath.  Denies abdominal pain, nausea, vomiting, constipation.  She reports that she has had diarrhea while hospitalized.  Denies bleeding.  Hematology was asked see the patient to make recommendations regarding her pancytopenia.   I  Past Medical History:  Diagnosis Date   Gallstones    GERD (gastroesophageal reflux disease)    only prn OTC occasionally   Headache    Multiple sclerosis (HCC)    Numbness and tingling of left leg    Numbness and tingling of right leg   :    Past Surgical History:  Procedure Laterality Date   CHOLECYSTECTOMY N/A 12/28/2013   Procedure: LAPAROSCOPIC CHOLECYSTECTOMY;  Surgeon: Fanny Skates, MD;  Location: [facility];  Service: General;  Laterality: N/A;  :   CURRENT MEDS: Current Facility-Administered Medications    Medication Dose Route Frequency Provider Last Rate Last Dose   acetaminophen (TYLENOL) tablet 650 mg  650 mg Oral Q6H PRN Rai, Ripudeep K, MD   650 mg at 08/07/18 1349   benzocaine (ORAJEL) 10 % mucosal gel   Mouth/Throat QID PRN Rai, Ripudeep K, MD       Chlorhexidine Gluconate Cloth 2 % PADS 6 each  6 each Topical Daily Rai, Ripudeep K, MD   6 each at 08/04/18 0945   feeding supplement (BOOST / RESOURCE BREEZE) liquid 1 Container  1 Container Oral Q24H Rai, Ripudeep K, MD   1 Container at 08/09/18 1157   feeding supplement (ENSURE ENLIVE) (ENSURE ENLIVE) liquid 237 mL  237 mL Oral Q24H Rai, Ripudeep K, MD   237 mL at 08/08/18 2009   feeding supplement (PRO-STAT SUGAR FREE 64) liquid 30 mL  30 mL Oral BID Rai, Ripudeep K, MD   30 mL at 08/09/18 1158   [START ON 08/14/2018] fluconazole (DIFLUCAN) tablet 100 mg  100 mg Oral Weekly Michel Bickers, MD       folic acid (FOLVITE) tablet 2 mg  2 mg Oral Daily Rai, Ripudeep K, MD   2 mg at 08/09/18 1157   free water 300 mL  300 mL Oral Q8H Rai, Ripudeep K, MD   300 mL at 08/08/18 2703   hydrocortisone (ANUSOL-HC) suppository 25 mg  25 mg Rectal BID Rai, Ripudeep K, MD       HYDROmorphone (DILAUDID) injection 0.5 mg  0.5 mg Intravenous Q4H PRN Rai,  Ripudeep K, MD       lidocaine (XYLOCAINE) 2 % viscous mouth solution 15 mL  15 mL Mouth/Throat TID AC & HS Rai, Ripudeep K, MD   15 mL at 08/09/18 1159   lip balm (CARMEX) ointment 1 application  1 application Topical PRN Rai, Ripudeep K, MD       loperamide (IMODIUM) capsule 2 mg  2 mg Oral Q8H PRN Rai, Ripudeep K, MD       LORazepam (ATIVAN) tablet 0.5 mg  0.5 mg Oral Q6H PRN Rai, Ripudeep K, MD       magnesium oxide (MAG-OX) tablet 400 mg  400 mg Oral BID Rai, Ripudeep K, MD   400 mg at 08/09/18 1157   metoprolol tartrate (LOPRESSOR) injection 2.5 mg  2.5 mg Intravenous Q4H PRN Rai, Ripudeep K, MD   2.5 mg at 08/07/18 1503   metoprolol tartrate (LOPRESSOR) tablet 25 mg  25 mg Oral BID Rai,  Ripudeep K, MD   25 mg at 08/09/18 1157   multivitamin with minerals tablet 1 tablet  1 tablet Oral Daily Rai, Ripudeep K, MD   1 tablet at 08/09/18 1158   nystatin (MYCOSTATIN) 100000 UNIT/ML suspension 500,000 Units  5 mL Oral QID Rai, Ripudeep K, MD   500,000 Units at 08/09/18 1157   ondansetron (ZOFRAN) injection 4 mg  4 mg Intravenous Q6H PRN Rai, Ripudeep K, MD   4 mg at 07/27/18 2154   phosphorus (K PHOS NEUTRAL) tablet 500 mg  500 mg Oral TID Rai, Ripudeep K, MD   500 mg at 08/07/18 1740   sodium chloride flush (NS) 0.9 % injection 10-40 mL  10-40 mL Intracatheter PRN Rai, Ripudeep K, MD       sodium chloride flush (NS) 0.9 % injection 3 mL  3 mL Intravenous Once Rai, Ripudeep K, MD       Facility-Administered Medications Ordered in Other Encounters  Medication Dose Route Frequency Provider Last Rate Last Dose   gadopentetate dimeglumine (MAGNEVIST) injection 20 mL  20 mL Intravenous Once PRN Marcial Pacas, MD          No Known Allergies:  Family History  Problem Relation Age of Onset   Hypertension Father    Hypertension Mother    Hypertension Sister    Diabetes Maternal Grandmother    Diabetes Maternal Grandfather   :   Social History   Socioeconomic History   Marital status: Married    Spouse name: Not on file   Number of children: 1   Years of education: 16   Highest education level: Bachelor's degree (e.g., BA, AB, BS)  Occupational History   Occupation: Archivist strain: Not on file   Food insecurity    Worry: Not on file    Inability: Not on file   Transportation needs    Medical: Not on file    Non-medical: Not on file  Tobacco Use   Smoking status: Never Smoker   Smokeless tobacco: Never Used  Substance and Sexual Activity   Alcohol use: No   Drug use: No   Sexual activity: Not Currently  Lifestyle   Physical activity    Days per week: Not on file    Minutes per session: Not on file    Stress: Not on file  Relationships   Social connections    Talks on phone: Not on file    Gets together: Not on file    Attends religious service: Not  on file    Active member of club or organization: Not on file    Attends meetings of clubs or organizations: Not on file    Relationship status: Not on file   Intimate partner violence    Fear of current or ex partner: Not on file    Emotionally abused: Not on file    Physically abused: Not on file    Forced sexual activity: Not on file  Other Topics Concern   Not on file  Social History Narrative   Lives at home with husband and son.   Right-handed.   No caffeine use.  :  REVIEW OF SYSTEMS:  A comprehensive 14 point ROS was negative except as noted in the HPI.   Exam: Patient Vitals for the past 24 hrs:  BP Temp Temp src Pulse Resp SpO2  08/09/18 0426 120/78 98.4 F (36.9 C) Oral (!) 117 20 97 %  08/08/18 2305 105/70 -- -- (!) 111 16 --  08/08/18 2009 112/75 99.3 F (37.4 C) Oral (!) 116 (!) 22 98 %  08/08/18 1347 (!) 98/59 98.2 F (36.8 C) Oral (!) 108 20 99 %    General: Sleepy, but wakes to talk, no acute distress.   Eyes:  EOMI, no scleral icterus.   ENT:  No thrush    Lymphatics:  Negative cervical, supraclavicular or axillary adenopathy.   GI:  abdomen was soft, flat, nontender, nondistended, without organomegaly.  Musculoskeletal: Strength symmetrical in the upper and lower extremities.   Skin exam was without ecchymosis, petechiae.   Neuro exam was nonfocal.  Patient was alert and oriented.  Attention was good.   Language was appropriate.  Mood was normal without depression.  Speech was not pressured.  Thought content was not tangential.    LABS:  Lab Results  Component Value Date   WBC 1.3 (LL) 08/09/2018   HGB 7.1 (L) 08/09/2018   HCT 23.9 (L) 08/09/2018   PLT 148 (L) 08/09/2018   GLUCOSE 81 08/09/2018   CHOL 79 12/20/2015   TRIG 71 12/20/2015   HDL 31 (L) 12/20/2015   LDLCALC 34 12/20/2015   ALT  28 08/08/2018   AST 87 (H) 08/08/2018   NA 140 08/09/2018   K 3.5 08/09/2018   CL 117 (H) 08/09/2018   CREATININE 1.08 (H) 08/09/2018   BUN 13 08/09/2018   CO2 16 (L) 08/09/2018   INR 1.4 (H) 08/01/2018   HGBA1C 5.4 06/07/2012   Blood smear 08/09/2018: The platelets appear normal to increased in number, few ovalocytes and teardrops.  Rare schistocytes.  The polychromasia is not increased.  The majority of the white cells are neutrophils with numerous band forms, rare myelocyte.  No blasts. Dg Chest 2 View  Result Date: 08/05/2018 CLINICAL DATA:  Fever of unknown origin, multiple sclerosis EXAM: CHEST - 2 VIEW COMPARISON:  07/27/2018 FINDINGS: Borderline enlargement of cardiac silhouette. Mediastinal contours and pulmonary vascularity normal. Bibasilar opacities question atelectasis versus infiltrate. Lungs otherwise clear. No pleural effusion or pneumothorax. Bones unremarkable. IMPRESSION: Bibasilar opacities question atelectasis versus infiltrate. Electronically Signed   By: Lavonia Dana M.D.   On: 08/05/2018 08:27   Dg Chest 2 View  Result Date: 07/27/2018 CLINICAL DATA:  Fever and malaise EXAM: CHEST - 2 VIEW COMPARISON:  July 16, 2018 FINDINGS: Lungs are clear. The heart size and pulmonary vascularity are normal. No adenopathy. No bone lesions. IMPRESSION: No edema or consolidation. Electronically Signed   By: Lowella Grip III M.D.   On: 07/27/2018  15:00   Ct Chest W Contrast  Result Date: 08/02/2018 CLINICAL DATA:  Fever of unknown origin. EXAM: CT CHEST, ABDOMEN, AND PELVIS WITH CONTRAST TECHNIQUE: Multidetector CT imaging of the chest, abdomen and pelvis was performed following the standard protocol during bolus administration of intravenous contrast. CONTRAST:  188m OMNIPAQUE IOHEXOL 300 MG/ML SOLN, 339mOMNIPAQUE IOHEXOL 300 MG/ML SOLN COMPARISON:  None. FINDINGS: CT CHEST FINDINGS Cardiovascular: There is no large centrally located pulmonary embolus. Detection of smaller segmental  and subsegmental pulmonary emboli is limited by contrast bolus timing. The main pulmonary artery is within normal limits for size. There is no CT evidence of acute right heart strain. The visualized aorta is normal. Heart size is normal. There is a trace pericardial effusion. Mediastinum/Nodes: --there are mildly prominent mediastinal and hilar lymph nodes bilaterally. --there is mild bilateral axillary adenopathy. --No supraclavicular lymphadenopathy. --Normal thyroid gland. --The esophagus is unremarkable Lungs/Pleura: There are trace bilateral pleural effusions with adjacent atelectasis. There is scattered ground-glass airspace opacities bilaterally, greatest within the right upper lobe. There is no pneumothorax. The trachea is unremarkable. Musculoskeletal: No chest wall abnormality. No acute or significant osseous findings. 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 CT ABDOMEN PELVIS FINDINGS Hepatobiliary: The liver is borderline enlarged. Status post cholecystectomy.There is no biliary ductal dilation. Pancreas: There is some mild peripancreatic fat stranding. Spleen: There is a small wedge-shaped defect in the lower pole the spleen measuring approximately 1.8 cm (axial series 2 image 47). Adrenals/Urinary Tract: --Adrenal glands: No adrenal hemorrhage. --Right kidney/ureter: No hydronephrosis or perinephric hematoma. --Left kidney/ureter: No hydronephrosis or perinephric hematoma. --Urinary bladder: Unremarkable. Stomach/Bowel: --Stomach/Duodenum: No hiatal hernia or other gastric abnormality. Normal duodenal course and caliber. --Small bowel: No dilatation or inflammation. --Colon: No focal abnormality. --Appendix: Normal. Vascular/Lymphatic: Normal course and caliber of the major abdominal vessels. There is a retroaortic left renal vein, a normal variant. --there are prominent retroperitoneal lymph nodes. --there are prominent mesenteric lymph nodes. There is haziness throughout  the mesentery. There are enlarged right lower quadrant lymph nodes measuring approximately 1.1 cm in diameter. --there are mildly enlarged pelvic and inguinal lymph nodes. Reproductive: There is a small amount of free fluid in the pelvis which may be physiologic. There is no discrete ovarian mass. Other: There is a small volume of pelvic free fluid which is likely physiologic. No free air. There is some mild fat stranding involving the anterior abdominal wall bilaterally. Musculoskeletal. No acute displaced fractures. IMPRESSION: 1. Scattered ground-glass airspace opacities, greatest within the right upper lobe, concerning for multifocal pneumonia. 2. Small bilateral pleural effusions with adjacent atelectasis. 3. Mild diffuse adenopathy throughout the chest, abdomen, and pelvis of unknown clinical significance. 4. Peripancreatic free fluid with edema within the root of the mesentery. Findings are nonspecific and can be seen in patients with pancreatitis, versus less likely sclerosing mesenteritis or inflammatory bowel disease. There are some prominent lymph nodes right lower quadrant which can be seen in patients with mesenteric adenitis. 5. Normal appendix in the right lower quadrant. 6. Small volume free fluid in the pelvis favored to be physiologic. 7. Wedge-shaped hypoattenuating defect in the spleen of unknown clinical significance. Differential considerations include a splenic infarct versus less likely a splenic laceration, especially in the absence of a history of trauma. There is no perisplenic hematoma. Electronically Signed   By: ChConstance Holster.D.   On: 08/02/2018 00:18   Mr Brain Wo Contrast  Result Date: 07/28/2018 CLINICAL DATA:  Encephalopathy with hallucinations EXAM:  MRI HEAD WITHOUT CONTRAST TECHNIQUE: Multiplanar, multiecho pulse sequences of the brain and surrounding structures were obtained without intravenous contrast. COMPARISON:  Brain MRI 03/24/2018 FINDINGS: BRAIN: There is no  acute infarct, acute hemorrhage or extra-axial collection. The midline structures are normal. The white matter signal is normal for the patient's age. The cerebral and cerebellar volume are age-appropriate. No hydrocephalus. Susceptibility-sensitive sequences show no chronic microhemorrhage or superficial siderosis. No midline shift or other mass effect. VASCULAR: The major intracranial arterial and venous sinus flow voids are normal. SKULL AND UPPER CERVICAL SPINE: Calvarial bone marrow signal is normal. There is no skull base mass. Visualized upper cervical spine and soft tissues are normal. SINUSES/ORBITS: No fluid levels or advanced mucosal thickening. No mastoid or middle ear effusion. The orbits are normal. IMPRESSION: Normal brain. Electronically Signed   By: Ulyses Jarred M.D.   On: 07/28/2018 00:13   Mr Lumbar Spine Wo Contrast  Result Date: 08/02/2018 CLINICAL DATA:  Fever. Lower extremity weakness. History of multiple sclerosis. EXAM: MRI LUMBAR SPINE WITHOUT CONTRAST TECHNIQUE: Multiplanar, multisequence MR imaging of the lumbar spine was performed. No intravenous contrast was administered. COMPARISON:  CT abdomen pelvis from yesterday. FINDINGS: Despite efforts by the technologist and patient, motion artifact is present on today's exam and could not be eliminated. This reduces exam sensitivity and specificity. Segmentation:  Standard. Alignment:  Physiologic. Vertebrae:  No fracture, evidence of discitis, or bone lesion. Conus medullaris and cauda equina: Conus extends to the L1-L2 level. Conus and cauda equina appear normal. Paraspinal and other soft tissues: Symmetric bilateral paraspinous muscle edema. Otherwise negative. Disc levels: No significant disc bulge or herniation. No spinal canal or neuroforaminal stenosis. IMPRESSION: 1.  No acute abnormality or significant degenerative changes. Electronically Signed   By: Titus Dubin M.D.   On: 08/02/2018 09:14   Ct Abdomen Pelvis W  Contrast  Result Date: 08/02/2018 CLINICAL DATA:  Fever of unknown origin. EXAM: CT CHEST, ABDOMEN, AND PELVIS WITH CONTRAST TECHNIQUE: Multidetector CT imaging of the chest, abdomen and pelvis was performed following the standard protocol during bolus administration of intravenous contrast. CONTRAST:  120m OMNIPAQUE IOHEXOL 300 MG/ML SOLN, 38mOMNIPAQUE IOHEXOL 300 MG/ML SOLN COMPARISON:  None. FINDINGS: CT CHEST FINDINGS Cardiovascular: There is no large centrally located pulmonary embolus. Detection of smaller segmental and subsegmental pulmonary emboli is limited by contrast bolus timing. The main pulmonary artery is within normal limits for size. There is no CT evidence of acute right heart strain. The visualized aorta is normal. Heart size is normal. There is a trace pericardial effusion. Mediastinum/Nodes: --there are mildly prominent mediastinal and hilar lymph nodes bilaterally. --there is mild bilateral axillary adenopathy. --No supraclavicular lymphadenopathy. --Normal thyroid gland. --The esophagus is unremarkable Lungs/Pleura: There are trace bilateral pleural effusions with adjacent atelectasis. There is scattered ground-glass airspace opacities bilaterally, greatest within the right upper lobe. There is no pneumothorax. The trachea is unremarkable. Musculoskeletal: No chest wall abnormality. No acute or significant osseous findings. 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 CT ABDOMEN PELVIS FINDINGS Hepatobiliary: The liver is borderline enlarged. Status post cholecystectomy.There is no biliary ductal dilation. Pancreas: There is some mild peripancreatic fat stranding. Spleen: There is a small wedge-shaped defect in the lower pole the spleen measuring approximately 1.8 cm (axial series 2 image 47). Adrenals/Urinary Tract: --Adrenal glands: No adrenal hemorrhage. --Right kidney/ureter: No hydronephrosis or perinephric hematoma. --Left kidney/ureter: No hydronephrosis or  perinephric hematoma. --Urinary bladder: Unremarkable. Stomach/Bowel: --Stomach/Duodenum: No hiatal hernia or other gastric  abnormality. Normal duodenal course and caliber. --Small bowel: No dilatation or inflammation. --Colon: No focal abnormality. --Appendix: Normal. Vascular/Lymphatic: Normal course and caliber of the major abdominal vessels. There is a retroaortic left renal vein, a normal variant. --there are prominent retroperitoneal lymph nodes. --there are prominent mesenteric lymph nodes. There is haziness throughout the mesentery. There are enlarged right lower quadrant lymph nodes measuring approximately 1.1 cm in diameter. --there are mildly enlarged pelvic and inguinal lymph nodes. Reproductive: There is a small amount of free fluid in the pelvis which may be physiologic. There is no discrete ovarian mass. Other: There is a small volume of pelvic free fluid which is likely physiologic. No free air. There is some mild fat stranding involving the anterior abdominal wall bilaterally. Musculoskeletal. No acute displaced fractures. IMPRESSION: 1. Scattered ground-glass airspace opacities, greatest within the right upper lobe, concerning for multifocal pneumonia. 2. Small bilateral pleural effusions with adjacent atelectasis. 3. Mild diffuse adenopathy throughout the chest, abdomen, and pelvis of unknown clinical significance. 4. Peripancreatic free fluid with edema within the root of the mesentery. Findings are nonspecific and can be seen in patients with pancreatitis, versus less likely sclerosing mesenteritis or inflammatory bowel disease. There are some prominent lymph nodes right lower quadrant which can be seen in patients with mesenteric adenitis. 5. Normal appendix in the right lower quadrant. 6. Small volume free fluid in the pelvis favored to be physiologic. 7. Wedge-shaped hypoattenuating defect in the spleen of unknown clinical significance. Differential considerations include a splenic infarct  versus less likely a splenic laceration, especially in the absence of a history of trauma. There is no perisplenic hematoma. Electronically Signed   By: Constance Holster M.D.   On: 08/02/2018 00:18   Ct Maxillofacial W Contrast  Result Date: 08/08/2018 CLINICAL DATA:  Fevers.  Right mandibular molar tooth pain. EXAM: CT MAXILLOFACIAL WITH CONTRAST TECHNIQUE: Multidetector CT imaging of the maxillofacial structures was performed with intravenous contrast. Multiplanar CT image reconstructions were also generated. CONTRAST:  32m OMNIPAQUE IOHEXOL 300 MG/ML  SOLN COMPARISON:  MRI 07/27/2018 FINDINGS: Osseous: No maxillary, mid face or upper face abnormality is seen. There is no evidence of active dental or periodontal disease. With respect to the right mandible, teeth 25 through 31 appear normal. Tooth 32 is absent and presumably has been extracted in the distant past. There is a benign appearing focus of hazy sclerosis measuring about 1.5 cm at the angle of the mandible on the right that could represent sequela of the previous extraction. If the patient has not had extraction of tooth 32, then this could be some dysplastic change related to aplasia of the tooth. I do not see any likely significant finding in this region. Left mandibular molar remains in place, not fully erupted. Orbits: Normal Sinuses: Clear Soft tissues: Normal. No soft tissue inflammatory changes are evident. Limited intracranial: Normal IMPRESSION: No evidence of dental or periodontal decay or inflammatory disease. Absent right mandibular molar tooth. Presumably there has been distant extraction. Hazy sclerosis within the mandible at the angle of the mandible could be chronic healed sequela of the molar tooth extraction. If there is not a history of molar tooth extraction, then there could be aplasia/hypoplasia of tooth 32. Electronically Signed   By: MNelson ChimesM.D.   On: 08/08/2018 11:49   Dg Chest Port 1 View  Result Date:  07/16/2018 CLINICAL DATA:  Fatigue EXAM: PORTABLE CHEST 1 VIEW COMPARISON:  None. FINDINGS: The heart size and mediastinal contours are within normal limits.  Both lungs are clear. The visualized skeletal structures are unremarkable. IMPRESSION: No active disease. Electronically Signed   By: Inez Catalina M.D.   On: 07/16/2018 15:18   Vas [place] Lower Extremity Venous (dvt)  Result Date: 08/06/2018  Lower Venous Study Indications: Immobility, fever.  Limitations: Patient positioning. Comparison Study: No prior Performing Technologist: Abram Sander RVS  Examination Guidelines: A complete evaluation includes B-mode imaging, spectral Doppler, color Doppler, and power Doppler as needed of all accessible portions of each vessel. Bilateral testing is considered an integral part of a complete examination. Limited examinations for reoccurring indications may be performed as noted.  +---------+---------------+---------+-----------+----------+--------------+  RIGHT     Compressibility Phasicity Spontaneity Properties Summary         +---------+---------------+---------+-----------+----------+--------------+  CFV       Full            Yes       Yes                                    +---------+---------------+---------+-----------+----------+--------------+  SFJ       Full                                                             +---------+---------------+---------+-----------+----------+--------------+  FV Prox   Full                                                             +---------+---------------+---------+-----------+----------+--------------+  FV Mid    Full                                                             +---------+---------------+---------+-----------+----------+--------------+  FV Distal Full                                                             +---------+---------------+---------+-----------+----------+--------------+  PFV       Full                                                              +---------+---------------+---------+-----------+----------+--------------+  POP       Full            Yes       Yes                                    +---------+---------------+---------+-----------+----------+--------------+  PTV  Full                                                             +---------+---------------+---------+-----------+----------+--------------+  PERO                                                       Not visualized  +---------+---------------+---------+-----------+----------+--------------+   +---------+---------------+---------+-----------+----------+--------------+  LEFT      Compressibility Phasicity Spontaneity Properties Summary         +---------+---------------+---------+-----------+----------+--------------+  CFV       Full            Yes       Yes                                    +---------+---------------+---------+-----------+----------+--------------+  SFJ       Full                                                             +---------+---------------+---------+-----------+----------+--------------+  FV Prox   Full                                                             +---------+---------------+---------+-----------+----------+--------------+  FV Mid    Full                                                             +---------+---------------+---------+-----------+----------+--------------+  FV Distal Full                                                             +---------+---------------+---------+-----------+----------+--------------+  PFV       Full                                                             +---------+---------------+---------+-----------+----------+--------------+  POP       Full            Yes       Yes                                    +---------+---------------+---------+-----------+----------+--------------+  PTV       Full                                                              +---------+---------------+---------+-----------+----------+--------------+  PERO                                                       Not visualized  +---------+---------------+---------+-----------+----------+--------------+     Summary: Right: There is no evidence of deep vein thrombosis in the lower extremity. No cystic structure found in the popliteal fossa. Left: There is no evidence of deep vein thrombosis in the lower extremity. No cystic structure found in the popliteal fossa.  *See table(s) above for measurements and observations. Electronically signed by Servando Snare MD on 08/06/2018 at 11:00:59 AM.    Final     ASSESSMENT AND PLAN:   1. Pancytopenia -characterized by lymphopenia, normocytic anemia, and mild, intermittent thrombocytopenia. 2.  Multiple sclerosis  Ms. Seyler has lymphopenia and normocytic anemia.  Her platelets are at the the low end of normal.  Suspect that her pancytopenia is reactive and related to her acute infection versus her medication for MS.  Have a low suspicion for an underlying bone marrow disorder.  Will add a differential on today CBC and check a CBC with differential in the morning.  Peripheral smear has been requested and will be reviewed.  Recommend limiting blood draws as frequent lab testing is contributing to her anemia.  Thank you for this referral.  Mikey Bussing, DNP, AGPCNP-BC, AOCNP  Ms. Esham was interviewed and examined.  I reviewed the peripheral blood smear and CT images  Impression: 1.  Multiple sclerosis 2.  Fever 3.  Anemia 4.  Leukopenia 5.  Mild thrombocytopenia 6.  Oral candidiasis 7.  History of a positive ANA 8.  Elevated liver enzymes  9.  Positive CMV IgM 10. Mildly enlarged lymph nodes in the chest, abdomen, and pelvis 11. CT evidence of multifocal pneumonia  Ms. Chokshi has a history of multiple sclerosis.  She was started on Aubagio on 07/06/2018.  She presented emergency room with a febrile illness on 07/16/2018.  She  represented to the emergency room on 07/27/2018 with altered mental status and fever on 07/27/2018. Ms. Giel was admitted and has remained hospitalized.  She has undergone an extensive diagnostic work-up for persistent fever.  No clear source of the fever has been identified.  She was noted to have anemia and lymphopenia on hospital admission.  The anemia has progressed.  She now has mild thrombocytopenia and a low total white count today.  I have a low clinical suspicion for a primary hematologic diagnosis such as a malignancy, myelodysplasia, or aplasia.  I suspect the cytopenias are related to bone marrow suppression from infection or an autoimmune process.  The small lymph nodes are most likely reactive.  She was noted to have a positive ANA in the past.   Recommendations:  1.  Continue evaluation for infection per the infectious disease service 2.  Check WBC differential 3.  ANA 4.  Consider rheumatology evaluation 5.  Diagnostic bone marrow biopsy if the pancytopenia and  fever persist  The hematology service will continue following Ms. Lwin in the hospital.

## 2018-08-09 NOTE — Progress Notes (Signed)
[facility]                                                                              Patient Demographics  Emily Hicks, is a 33 y.o. female, DOB - [birth year], ZOX:[id number]  Admit date - 07/27/2018   Admitting Physician Burnadette PopAmrit Adhikari, MD  Outpatient Primary MD for the patient is Sandre Kittylson, Daniel K, MD  Outpatient specialists:   LOS - 13  days   Medical records reviewed and are as summarized below:    Chief Complaint  Patient presents with   Fever       Brief summary  Emily Hicks is an 33 y.o. female with medical history significant ofmultiple sclerosis, depression who was brought by her husband to the emergency department for the evaluation of altered mental status, fever, generalized weakness. H/o MS off aubagio a week ago due to intolerance, presents with Confusion, weakness, loss of appetite, fever, sepsis.  Found to have severe oropharyngeal thrush with odynophagia.  Emily Hicks has much improved, odynophagia has resolved.  Remittent fever on 08/03/2018.  Awaiting CIR.   Assessment & Plan    Sepsis, likely due to oropharyngeal candidiasis and esophageal Candida, intermittent fevers - COVID-19 screening negative, Initial w/u negative. Blood cultures negative so far.  Urine culture negative.  Chest x-ray showed no acute infiltrates.  No meningismal signs. - CMV IgM elevated at 240, no CMV DNA detected - HIV/RPR negative, HSV PCR negative, EBV IgG+ but IgM-  - reviewed recent CT chest, abd and pelvis-> multifocal PNA, peripancreatic fluid, pancreatitis, vs mesenteric adenitis. Started on IV Zosyn on 7/1 but discontinued by ID -Repeat LFTs, lipase normal.  No UTI. -mycoplasma serologies pending, urine Legionella antigen not collected, will send. No UTI. -2D echo 6/29 showed no endocarditis - BCID 1/2 + coagulase-negative staph, likely contaminant.  -C. difficile negative. -Continue Diflucan, prophylactic per ID -CT maxillofacial showed no dental abscess or  caries.  ID following, discontinued amoxicillin.    Pancytopenia with leukopenia, anemia and thrombocytopenia -Possibly due to sepsis, unclear etiology,aubagio (MS drug) can cause pancytopenia with myelosuppression however patient has been off of it for at least 3 weeks and this pancytopenia is new. -Hemoglobin 7.1, oncology consulted -Obtain anemia panel, FOBT, add on Diff, smear, LDH, haptoglobin  Hypernatremia -Sodium 157 at the time of admission -Likely due to dehydration -Sodium is stable  Hypokalemia, hypomagnesemia -Potassium 3.5 -Magnesium stable 2.1  Severe hypophosphatemia -Phosphorus improving, continue supplement   Hypocalcemia -Corrected calcium stable  Hypoalbuminemia, severe protein calorie malnutrition -Albumin 1.7, received 3 doses of IV albumin on 7/5, continue pro-stat  Sinus tachycardia TSH 0.9, likely due to #1 Continue beta-blocker.  CT chest on 6/28 was negative for PE.  Acute kidney injury -Presented with creatinine of 1.2 -Improved with IV fluid hydration  Transaminitis -Possible side effect from aubagio -AST improving  History of multiple sclerosis -Recently stopped aubagio a week prior to admission due to pancytopenia, abnormal liver function -MRI of the brain showed no acute findings. -Per neurology, Dr. Laurence SlateAroor generalized weakness likely pseudo-exacerbation due to acute illness/sepsis  Generalized weakness - MRI of the lumbar spine showed no acute abnormality. - Plan  for CIR when afebrile for 24 hours -There was a question of possible some component of conversion disorder versus cognitive deficits related to MS/sepsis.  Per PT, inconsistent with physical presentation, able to ambulate to bathroom then unable to stand, able to move upper extremities and then unable to feed self. -Lower extremity venous Dopplers negative for DVT  Obesity BMI 37.0   Code Status: full  DVT Prophylaxis:  Lovenox Family Communication: Discussed in detail  with the patient, all imaging results, lab results explained to the patient's husband on the phone on 7/5   Disposition Plan: CIR when bed available and afebrile for at least 24 hours  Time Spent in minutes : 25 minutes  Procedures:  none   Consultants:   Infectious disease GI  Hematology oncology, Dr. Myrle Sheng  Antimicrobials:   Anti-infectives (From admission, onward)   Start     Dose/Rate Route Frequency Ordered Stop   08/14/18 0859  fluconazole (DIFLUCAN) tablet 100 mg     100 mg Oral Weekly 08/07/18 1128     08/08/18 0815  amoxicillin (AMOXIL) capsule 500 mg  Status:  Discontinued     500 mg Oral Every 8 hours 08/08/18 0745 08/08/18 1409   08/06/18 1000  fluconazole (DIFLUCAN) tablet 100 mg  Status:  Discontinued     100 mg Oral Daily 08/05/18 1643 08/07/18 1128   08/04/18 2200  piperacillin-tazobactam (ZOSYN) IVPB 3.375 g  Status:  Discontinued     3.375 g 12.5 mL/hr over 240 Minutes Intravenous Every 8 hours 08/04/18 2156 08/05/18 1643   08/03/18 1000  fluconazole (DIFLUCAN) tablet 200 mg  Status:  Discontinued     200 mg Oral Daily 08/02/18 1500 08/05/18 1643   07/28/18 1700  vancomycin (VANCOCIN) IVPB 750 mg/150 ml premix  Status:  Discontinued     750 mg 150 mL/hr over 60 Minutes Intravenous Every 24 hours 07/27/18 1606 07/28/18 0947   07/28/18 1000  fluconazole (DIFLUCAN) IVPB 200 mg  Status:  Discontinued     200 mg 100 mL/hr over 60 Minutes Intravenous Every 24 hours 07/28/18 0954 08/02/18 1500   07/27/18 1800  ceFEPIme (MAXIPIME) 2 g in sodium chloride 0.9 % 100 mL IVPB  Status:  Discontinued     2 g 200 mL/hr over 30 Minutes Intravenous Every 8 hours 07/27/18 1606 08/01/18 1414   07/27/18 1730  fluconazole (DIFLUCAN) tablet 200 mg  Status:  Discontinued     200 mg Oral  Once 07/27/18 1717 07/28/18 0953   07/27/18 1615  vancomycin (VANCOCIN) 1,500 mg in sodium chloride 0.9 % 500 mL IVPB     1,500 mg 250 mL/hr over 120 Minutes Intravenous  Once 07/27/18 1606  07/27/18 1851   07/27/18 1415  cefTRIAXone (ROCEPHIN) 1 g in sodium chloride 0.9 % 100 mL IVPB     1 g 200 mL/hr over 30 Minutes Intravenous  Once 07/27/18 1404 07/27/18 1642         Medications  Scheduled Meds:  Chlorhexidine Gluconate Cloth  6 each Topical Daily   feeding supplement  1 Container Oral Q24H   feeding supplement (ENSURE ENLIVE)  237 mL Oral Q24H   feeding supplement (PRO-STAT SUGAR FREE 64)  30 mL Oral BID   [START ON 08/14/2018] fluconazole  100 mg Oral Weekly   folic acid  2 mg Oral Daily   free water  300 mL Oral Q8H   hydrocortisone  25 mg Rectal BID   lidocaine  15 mL Mouth/Throat TID AC & HS  magnesium oxide  400 mg Oral BID   metoprolol tartrate  25 mg Oral BID   multivitamin with minerals  1 tablet Oral Daily   nystatin  5 mL Oral QID   phosphorus  500 mg Oral TID   sodium chloride flush  3 mL Intravenous Once   Continuous Infusions:  PRN Meds:.acetaminophen, benzocaine, HYDROmorphone (DILAUDID) injection, ketorolac, lip balm, loperamide, LORazepam, metoprolol tartrate, ondansetron (ZOFRAN) IV, sodium chloride flush      Subjective:   Emily Hicks was seen and examined today.  No fevers overnight.  Diarrhea improving, no nausea vomiting or abdominal pain.  Generalized weakness.   Patient denies dizziness, chest pain, shortness of breath,   Objective:   Vitals:   08/08/18 1347 08/08/18 2009 08/08/18 2305 08/09/18 0426  BP: (!) 98/59 112/75 105/70 120/78  Pulse: (!) 108 (!) 116 (!) 111 (!) 117  Resp: 20 (!) Temp: 98.2 F (36.8 C) 99.3 F (37.4 C)  98.4 F (36.9 C)  TempSrc: Oral Oral  Oral  SpO2: 99% 98%  97%  Weight:      Height:        Intake/Output Summary (Last 24 hours) at 08/09/2018 1256 Last data filed at 08/09/2018 0930 Gross per 24 hour  Intake 331.03 ml  Output 700 ml  Net -368.97 ml     Wt Readings from Last 3 Encounters:  07/31/18 89 kg  07/16/18 72.6 kg  07/06/18 90.7 kg   Physical  Exam  General: Alert and oriented x 3, NAD  Eyes:   HEENT:  Atraumatic, normocephalic  Cardiovascular: S1 S2 clear, no murmurs, RRR. No pedal edema b/l  Respiratory: CTAB, no wheezing, rales or rhonchi  Gastrointestinal: Soft, nontender, nondistended, NBS  Ext: no pedal edema bilaterally  Neuro: no new deficits  Musculoskeletal: No cyanosis, clubbing  Skin: No rashes  Psych: Normal affect and demeanor, alert and oriented x3     Data Reviewed:  I have personally reviewed following labs and imaging studies  Micro Results Recent Results (from the past 240 hour(s))  Urine Culture     Status: None   Collection Time: 08/04/18  9:35 PM   Specimen: Urine, Clean Catch  Result Value Ref Range Status   Specimen Description   Final    URINE, CLEAN CATCH Performed at [facility], [address] [place], [place] [address]    Special Requests   Final    NONE Performed at [facility], [address], [place], [place] [address]    Culture   Final    NO GROWTH Performed at [facility], [address], [place], [place] [address]    Report Status 08/06/2018 FINAL  Final  Culture, blood (routine x 2)     Status: None (Preliminary result)   Collection Time: 08/04/18  9:41 PM   Specimen: BLOOD  Result Value Ref Range Status   Specimen Description   Final    BLOOD BLOOD RIGHT ARM Performed at [facility], [address], [place], [place] [address]    Special Requests   Final    BOTTLES DRAWN AEROBIC ONLY Blood Culture adequate volume Performed at [facility], [address], [place], [place] [address]    Culture   Final    NO GROWTH 3 DAYS Performed at [facility], [address] [place], [place] [address]    Report Status PENDING  Incomplete  Culture, blood (routine x 2)  Status: Abnormal   Collection Time: 08/04/18  9:46 PM   Specimen: BLOOD  Result Value Ref Range Status    Specimen Description   Final    BLOOD BLOOD LEFT HAND Performed at [facility], [address], [place], [place] [address]    Special Requests   Final    BOTTLES DRAWN AEROBIC AND ANAEROBIC Blood Culture adequate volume Performed at [facility], [address] [place], [place] [address]    Culture  Setup Time   Final    AEROBIC BOTTLE ONLY GRAM POSITIVE COCCI CRITICAL RESULT CALLED TO, READ BACK BY AND VERIFIED WITH: J GRIMSLEY PHARMD 08/06/18 0400 JDW    Culture (A)  Final    STAPHYLOCOCCUS SPECIES (COAGULASE NEGATIVE) THE SIGNIFICANCE OF ISOLATING THIS ORGANISM FROM A SINGLE SET OF BLOOD CULTURES WHEN MULTIPLE SETS ARE DRAWN IS UNCERTAIN. PLEASE NOTIFY THE MICROBIOLOGY DEPARTMENT WITHIN ONE WEEK IF SPECIATION AND SENSITIVITIES ARE REQUIRED. Performed at [facility], [address] [place], [place] [address]    Report Status 08/07/2018 FINAL  Final  Blood Culture ID Panel (Reflexed)     Status: Abnormal   Collection Time: 08/04/18  9:46 PM  Result Value Ref Range Status   Enterococcus species NOT DETECTED NOT DETECTED Final   Listeria monocytogenes NOT DETECTED NOT DETECTED Final   Staphylococcus species DETECTED (A) NOT DETECTED Final    Comment: Methicillin (oxacillin) resistant coagulase negative staphylococcus. Possible blood culture contaminant (unless isolated from more than one blood culture draw or clinical case suggests pathogenicity). No antibiotic treatment is indicated for blood  culture contaminants. CRITICAL RESULT CALLED TO, READ BACK BY AND VERIFIED WITH: J GRIMSLEY PHARMD 08/06/18 0400 JDW    Staphylococcus aureus (BCID) NOT DETECTED NOT DETECTED Final   Methicillin resistance DETECTED (A) NOT DETECTED Final    Comment: CRITICAL RESULT CALLED TO, READ BACK BY AND VERIFIED WITH: J GRIMSLEY PHARMD 08/06/18 0400 JDW    Streptococcus species NOT DETECTED NOT DETECTED Final   Streptococcus agalactiae NOT DETECTED NOT DETECTED  Final   Streptococcus pneumoniae NOT DETECTED NOT DETECTED Final   Streptococcus pyogenes NOT DETECTED NOT DETECTED Final   Acinetobacter baumannii NOT DETECTED NOT DETECTED Final   Enterobacteriaceae species NOT DETECTED NOT DETECTED Final   Enterobacter cloacae complex NOT DETECTED NOT DETECTED Final   Escherichia coli NOT DETECTED NOT DETECTED Final   Klebsiella oxytoca NOT DETECTED NOT DETECTED Final   Klebsiella pneumoniae NOT DETECTED NOT DETECTED Final   Proteus species NOT DETECTED NOT DETECTED Final   Serratia marcescens NOT DETECTED NOT DETECTED Final   Haemophilus influenzae NOT DETECTED NOT DETECTED Final   Neisseria meningitidis NOT DETECTED NOT DETECTED Final   Pseudomonas aeruginosa NOT DETECTED NOT DETECTED Final   Candida albicans NOT DETECTED NOT DETECTED Final   Candida glabrata NOT DETECTED NOT DETECTED Final   Candida krusei NOT DETECTED NOT DETECTED Final   Candida parapsilosis NOT DETECTED NOT DETECTED Final   Candida tropicalis NOT DETECTED NOT DETECTED Final    Comment: Performed at [facility], [address], [place], [place] [address]  Culture, blood (routine x 2)     Status: None (Preliminary result)   Collection Time: 08/05/18  6:59 PM   Specimen: BLOOD  Result Value Ref Range Status   Specimen Description   Final    BLOOD LEFT HAND Performed at [facility] [address] [place], [place] [address]    Special Requests   Final  BOTTLES DRAWN AEROBIC ONLY Blood Culture adequate volume Performed at [facility], [address] [place], [place] [address]    Culture   Final    NO GROWTH 3 DAYS Performed at [facility], [address] [place], [place] [address]    Report Status PENDING  Incomplete  C difficile quick scan w PCR reflex     Status: None   Collection Time: 08/07/18 11:29 AM   Specimen: STOOL  Result Value Ref Range Status   C Diff antigen NEGATIVE NEGATIVE Final   C Diff toxin  NEGATIVE NEGATIVE Final   C Diff interpretation No C. difficile detected.  Final    Comment: Performed at [facility], [address] [place], [place] [address]    Radiology Reports Dg Chest 2 View  Result Date: 08/05/2018 CLINICAL DATA:  Fever of unknown origin, multiple sclerosis EXAM: CHEST - 2 VIEW COMPARISON:  07/27/2018 FINDINGS: Borderline enlargement of cardiac silhouette. Mediastinal contours and pulmonary vascularity normal. Bibasilar opacities question atelectasis versus infiltrate. Lungs otherwise clear. No pleural effusion or pneumothorax. Bones unremarkable. IMPRESSION: Bibasilar opacities question atelectasis versus infiltrate. Electronically Signed   By: Ulyses SouthwardMark  Boles M.D.   On: 08/05/2018 08:27   Dg Chest 2 View  Result Date: 07/27/2018 CLINICAL DATA:  Fever and malaise EXAM: CHEST - 2 VIEW COMPARISON:  July 16, 2018 FINDINGS: Lungs are clear. The heart size and pulmonary vascularity are normal. No adenopathy. No bone lesions. IMPRESSION: No edema or consolidation. Electronically Signed   By: Bretta BangWilliam  Woodruff III M.D.   On: 07/27/2018 15:00   Ct Chest W Contrast  Result Date: 08/02/2018 CLINICAL DATA:  Fever of unknown origin. EXAM: CT CHEST, ABDOMEN, AND PELVIS WITH CONTRAST TECHNIQUE: Multidetector CT imaging of the chest, abdomen and pelvis was performed following the standard protocol during bolus administration of intravenous contrast. CONTRAST:  100mL OMNIPAQUE IOHEXOL 300 MG/ML SOLN, 30mL OMNIPAQUE IOHEXOL 300 MG/ML SOLN COMPARISON:  None. FINDINGS: CT CHEST FINDINGS Cardiovascular: There is no large centrally located pulmonary embolus. Detection of smaller segmental and subsegmental pulmonary emboli is limited by contrast bolus timing. The main pulmonary artery is within normal limits for size. There is no CT evidence of acute right heart strain. The visualized aorta is normal. Heart size is normal. There is a trace pericardial effusion. Mediastinum/Nodes:  --there are mildly prominent mediastinal and hilar lymph nodes bilaterally. --there is mild bilateral axillary adenopathy. --No supraclavicular lymphadenopathy. --Normal thyroid gland. --The esophagus is unremarkable Lungs/Pleura: There are trace bilateral pleural effusions with adjacent atelectasis. There is scattered ground-glass airspace opacities bilaterally, greatest within the right upper lobe. There is no pneumothorax. The trachea is unremarkable. Musculoskeletal: No chest wall abnormality. No acute or significant osseous findings. 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 CT ABDOMEN PELVIS FINDINGS Hepatobiliary: The liver is borderline enlarged. Status post cholecystectomy.There is no biliary ductal dilation. Pancreas: There is some mild peripancreatic fat stranding. Spleen: There is a small wedge-shaped defect in the lower pole the spleen measuring approximately 1.8 cm (axial series 2 image 47). Adrenals/Urinary Tract: --Adrenal glands: No adrenal hemorrhage. --Right kidney/ureter: No hydronephrosis or perinephric hematoma. --Left kidney/ureter: No hydronephrosis or perinephric hematoma. --Urinary bladder: Unremarkable. Stomach/Bowel: --Stomach/Duodenum: No hiatal hernia or other gastric abnormality. Normal duodenal course and caliber. --Small bowel: No dilatation or inflammation. --Colon: No focal abnormality. --Appendix: Normal. Vascular/Lymphatic: Normal course and caliber of the major abdominal vessels. There is a retroaortic left renal vein, a normal variant. --there  are prominent retroperitoneal lymph nodes. --there are prominent mesenteric lymph nodes. There is haziness throughout the mesentery. There are enlarged right lower quadrant lymph nodes measuring approximately 1.1 cm in diameter. --there are mildly enlarged pelvic and inguinal lymph nodes. Reproductive: There is a small amount of free fluid in the pelvis which may be physiologic. There is no discrete ovarian  mass. Other: There is a small volume of pelvic free fluid which is likely physiologic. No free air. There is some mild fat stranding involving the anterior abdominal wall bilaterally. Musculoskeletal. No acute displaced fractures. IMPRESSION: 1. Scattered ground-glass airspace opacities, greatest within the right upper lobe, concerning for multifocal pneumonia. 2. Small bilateral pleural effusions with adjacent atelectasis. 3. Mild diffuse adenopathy throughout the chest, abdomen, and pelvis of unknown clinical significance. 4. Peripancreatic free fluid with edema within the root of the mesentery. Findings are nonspecific and can be seen in patients with pancreatitis, versus less likely sclerosing mesenteritis or inflammatory bowel disease. There are some prominent lymph nodes right lower quadrant which can be seen in patients with mesenteric adenitis. 5. Normal appendix in the right lower quadrant. 6. Small volume free fluid in the pelvis favored to be physiologic. 7. Wedge-shaped hypoattenuating defect in the spleen of unknown clinical significance. Differential considerations include a splenic infarct versus less likely a splenic laceration, especially in the absence of a history of trauma. There is no perisplenic hematoma. Electronically Signed   By: Constance Holster M.D.   On: 08/02/2018 00:18   Mr Brain Wo Contrast  Result Date: 07/28/2018 CLINICAL DATA:  Encephalopathy with hallucinations EXAM: MRI HEAD WITHOUT CONTRAST TECHNIQUE: Multiplanar, multiecho pulse sequences of the brain and surrounding structures were obtained without intravenous contrast. COMPARISON:  Brain MRI 03/24/2018 FINDINGS: BRAIN: There is no acute infarct, acute hemorrhage or extra-axial collection. The midline structures are normal. The white matter signal is normal for the patient's age. The cerebral and cerebellar volume are age-appropriate. No hydrocephalus. Susceptibility-sensitive sequences show no chronic microhemorrhage or  superficial siderosis. No midline shift or other mass effect. VASCULAR: The major intracranial arterial and venous sinus flow voids are normal. SKULL AND UPPER CERVICAL SPINE: Calvarial bone marrow signal is normal. There is no skull base mass. Visualized upper cervical spine and soft tissues are normal. SINUSES/ORBITS: No fluid levels or advanced mucosal thickening. No mastoid or middle ear effusion. The orbits are normal. IMPRESSION: Normal brain. Electronically Signed   By: Ulyses Jarred M.D.   On: 07/28/2018 00:13   Mr Lumbar Spine Wo Contrast  Result Date: 08/02/2018 CLINICAL DATA:  Fever. Lower extremity weakness. History of multiple sclerosis. EXAM: MRI LUMBAR SPINE WITHOUT CONTRAST TECHNIQUE: Multiplanar, multisequence MR imaging of the lumbar spine was performed. No intravenous contrast was administered. COMPARISON:  CT abdomen pelvis from yesterday. FINDINGS: Despite efforts by the technologist and patient, motion artifact is present on today's exam and could not be eliminated. This reduces exam sensitivity and specificity. Segmentation:  Standard. Alignment:  Physiologic. Vertebrae:  No fracture, evidence of discitis, or bone lesion. Conus medullaris and cauda equina: Conus extends to the L1-L2 level. Conus and cauda equina appear normal. Paraspinal and other soft tissues: Symmetric bilateral paraspinous muscle edema. Otherwise negative. Disc levels: No significant disc bulge or herniation. No spinal canal or neuroforaminal stenosis. IMPRESSION: 1.  No acute abnormality or significant degenerative changes. Electronically Signed   By: Titus Dubin M.D.   On: 08/02/2018 09:14   Ct Abdomen Pelvis W Contrast  Result Date: 08/02/2018 CLINICAL DATA:  Fever of  unknown origin. EXAM: CT CHEST, ABDOMEN, AND PELVIS WITH CONTRAST TECHNIQUE: Multidetector CT imaging of the chest, abdomen and pelvis was performed following the standard protocol during bolus administration of intravenous contrast. CONTRAST:   OMNIPAQUE IOHEXOL 300 MG/ML SOLN, 30mL OMNIPAQUE IOHEXOL 300 MG/ML SOLN COMPARISON:  None. FINDINGS: CT CHEST FINDINGS Cardiovascular: There is no large centrally located pulmonary embolus. Detection of smaller segmental and subsegmental pulmonary emboli is limited by contrast bolus timing. The main pulmonary artery is within normal limits for size. There is no CT evidence of acute right heart strain. The visualized aorta is normal. Heart size is normal. There is a trace pericardial effusion. Mediastinum/Nodes: --there are mildly prominent mediastinal and hilar lymph nodes bilaterally. --there is mild bilateral axillary adenopathy. --No supraclavicular lymphadenopathy. --Normal thyroid gland. --The esophagus is unremarkable Lungs/Pleura: There are trace bilateral pleural effusions with adjacent atelectasis. There is scattered ground-glass airspace opacities bilaterally, greatest within the right upper lobe. There is no pneumothorax. The trachea is unremarkable. Musculoskeletal: No chest wall abnormality. No acute or significant osseous findings. 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 CT ABDOMEN PELVIS FINDINGS Hepatobiliary: The liver is borderline enlarged. Status post cholecystectomy.There is no biliary ductal dilation. Pancreas: There is some mild peripancreatic fat stranding. Spleen: There is a small wedge-shaped defect in the lower pole the spleen measuring approximately 1.8 cm (axial series 2 image 47). Adrenals/Urinary Tract: --Adrenal glands: No adrenal hemorrhage. --Right kidney/ureter: No hydronephrosis or perinephric hematoma. --Left kidney/ureter: No hydronephrosis or perinephric hematoma. --Urinary bladder: Unremarkable. Stomach/Bowel: --Stomach/Duodenum: No hiatal hernia or other gastric abnormality. Normal duodenal course and caliber. --Small bowel: No dilatation or inflammation. --Colon: No focal abnormality. --Appendix: Normal. Vascular/Lymphatic: Normal course  and caliber of the major abdominal vessels. There is a retroaortic left renal vein, a normal variant. --there are prominent retroperitoneal lymph nodes. --there are prominent mesenteric lymph nodes. There is haziness throughout the mesentery. There are enlarged right lower quadrant lymph nodes measuring approximately 1.1 cm in diameter. --there are mildly enlarged pelvic and inguinal lymph nodes. Reproductive: There is a small amount of free fluid in the pelvis which may be physiologic. There is no discrete ovarian mass. Other: There is a small volume of pelvic free fluid which is likely physiologic. No free air. There is some mild fat stranding involving the anterior abdominal wall bilaterally. Musculoskeletal. No acute displaced fractures. IMPRESSION: 1. Scattered ground-glass airspace opacities, greatest within the right upper lobe, concerning for multifocal pneumonia. 2. Small bilateral pleural effusions with adjacent atelectasis. 3. Mild diffuse adenopathy throughout the chest, abdomen, and pelvis of unknown clinical significance. 4. Peripancreatic free fluid with edema within the root of the mesentery. Findings are nonspecific and can be seen in patients with pancreatitis, versus less likely sclerosing mesenteritis or inflammatory bowel disease. There are some prominent lymph nodes right lower quadrant which can be seen in patients with mesenteric adenitis. 5. Normal appendix in the right lower quadrant. 6. Small volume free fluid in the pelvis favored to be physiologic. 7. Wedge-shaped hypoattenuating defect in the spleen of unknown clinical significance. Differential considerations include a splenic infarct versus less likely a splenic laceration, especially in the absence of a history of trauma. There is no perisplenic hematoma. Electronically Signed   By: Katherine Mantle M.D.   On: 08/02/2018 00:18   Ct Maxillofacial W Contrast  Result Date: 08/08/2018 CLINICAL DATA:  Fevers.  Right mandibular molar  tooth pain. EXAM: CT MAXILLOFACIAL WITH CONTRAST TECHNIQUE: Multidetector CT imaging of the maxillofacial  structures was performed with intravenous contrast. Multiplanar CT image reconstructions were also generated. CONTRAST:  75mL OMNIPAQUE IOHEXOL 300 MG/ML  SOLN COMPARISON:  MRI 07/27/2018 FINDINGS: Osseous: No maxillary, mid face or upper face abnormality is seen. There is no evidence of active dental or periodontal disease. With respect to the right mandible, teeth 25 through 31 appear normal. Tooth 32 is absent and presumably has been extracted in the distant past. There is a benign appearing focus of hazy sclerosis measuring about 1.5 cm at the angle of the mandible on the right that could represent sequela of the previous extraction. If the patient has not had extraction of tooth 32, then this could be some dysplastic change related to aplasia of the tooth. I do not see any likely significant finding in this region. Left mandibular molar remains in place, not fully erupted. Orbits: Normal Sinuses: Clear Soft tissues: Normal. No soft tissue inflammatory changes are evident. Limited intracranial: Normal IMPRESSION: No evidence of dental or periodontal decay or inflammatory disease. Absent right mandibular molar tooth. Presumably there has been distant extraction. Hazy sclerosis within the mandible at the angle of the mandible could be chronic healed sequela of the molar tooth extraction. If there is not a history of molar tooth extraction, then there could be aplasia/hypoplasia of tooth 32. Electronically Signed   By: Paulina Fusi M.D.   On: 08/08/2018 11:49   Dg Chest Port 1 View  Result Date: 07/16/2018 CLINICAL DATA:  Fatigue EXAM: PORTABLE CHEST 1 VIEW COMPARISON:  None. FINDINGS: The heart size and mediastinal contours are within normal limits. Both lungs are clear. The visualized skeletal structures are unremarkable. IMPRESSION: No active disease. Electronically Signed   By: Alcide Clever M.D.   On:  07/16/2018 15:18   Vas [place] Lower Extremity Venous (dvt)  Result Date: 08/06/2018  Lower Venous Study Indications: Immobility, fever.  Limitations: Patient positioning. Comparison Study: No prior Performing Technologist: Blanch Media RVS  Examination Guidelines: A complete evaluation includes B-mode imaging, spectral Doppler, color Doppler, and power Doppler as needed of all accessible portions of each vessel. Bilateral testing is considered an integral part of a complete examination. Limited examinations for reoccurring indications may be performed as noted.  +---------+---------------+---------+-----------+----------+--------------+  RIGHT     Compressibility Phasicity Spontaneity Properties Summary         +---------+---------------+---------+-----------+----------+--------------+  CFV       Full            Yes       Yes                                    +---------+---------------+---------+-----------+----------+--------------+  SFJ       Full                                                             +---------+---------------+---------+-----------+----------+--------------+  FV Prox   Full                                                             +---------+---------------+---------+-----------+----------+--------------+  FV Mid    Full                                                             +---------+---------------+---------+-----------+----------+--------------+  FV Distal Full                                                             +---------+---------------+---------+-----------+----------+--------------+  PFV       Full                                                             +---------+---------------+---------+-----------+----------+--------------+  POP       Full            Yes       Yes                                    +---------+---------------+---------+-----------+----------+--------------+  PTV       Full                                                              +---------+---------------+---------+-----------+----------+--------------+  PERO                                                       Not visualized  +---------+---------------+---------+-----------+----------+--------------+   +---------+---------------+---------+-----------+----------+--------------+  LEFT      Compressibility Phasicity Spontaneity Properties Summary         +---------+---------------+---------+-----------+----------+--------------+  CFV       Full            Yes       Yes                                    +---------+---------------+---------+-----------+----------+--------------+  SFJ       Full                                                             +---------+---------------+---------+-----------+----------+--------------+  FV Prox   Full                                                             +---------+---------------+---------+-----------+----------+--------------+  FV Mid    Full                                                             +---------+---------------+---------+-----------+----------+--------------+  FV Distal Full                                                             +---------+---------------+---------+-----------+----------+--------------+  PFV       Full                                                             +---------+---------------+---------+-----------+----------+--------------+  POP       Full            Yes       Yes                                    +---------+---------------+---------+-----------+----------+--------------+  PTV       Full                                                             +---------+---------------+---------+-----------+----------+--------------+  PERO                                                       Not visualized  +---------+---------------+---------+-----------+----------+--------------+     Summary: Right: There is no evidence of deep vein thrombosis in the lower extremity. No cystic structure found in the  popliteal fossa. Left: There is no evidence of deep vein thrombosis in the lower extremity. No cystic structure found in the popliteal fossa.  *See table(s) above for measurements and observations. Electronically signed by Lemar Livings MD on 08/06/2018 at 11:00:59 AM.    Final     Lab Data:  CBC: Recent Labs  Lab 08/05/18 1607 08/06/18 0350 08/07/18 0354 08/08/18 0428 08/09/18 0947  WBC 2.8* 2.9* 2.2* 1.9* 1.3*  NEUTROABS 1.8  --   --   --   --   HGB 8.6* 8.4* 8.1* 7.5* 7.1*  HCT 27.7* 27.4* 26.1* 24.9* 23.9*  MCV 90.8 89.0 90.0 90.9 92.3  PLT 154 166 172 145* 148*   Basic Metabolic Panel: Recent Labs  Lab 08/04/18 0245 08/05/18 1607 08/06/18 0350 08/07/18 0354 08/08/18 0428 08/09/18 0947  NA 140 139 140 139 140 140  K 3.3* 3.2* 3.2* 3.3* 3.3* 3.5  CL 117* 116* 116* 115* 117* 117*  CO2 19* 18* 17* 18* 18* 16*  GLUCOSE 103* 110* 97 95 87 81  BUN 7 7 6 7 10 13   CREATININE 0.44 0.49 0.51 0.63 0.84 1.08*  CALCIUM 6.9* 6.8* 7.0* 6.8* 6.4* 7.2*  MG 1.7  --  1.5* 1.5* 1.4* 2.1  PHOS 1.4*  --  <1.0* 1.9* 2.8 2.7   GFR: Estimated Creatinine Clearance: 75.9 mL/min (A) (by C-G formula based on SCr of 1.08 mg/dL (H)). Liver Function Tests: Recent Labs  Lab 08/03/18 0620 08/05/18 1607 08/08/18 0428  AST  --  58* 87*  ALT  --  23 28  ALKPHOS  --  83 74  BILITOT  --  0.2* 0.3  PROT  --  5.2* 5.1*  ALBUMIN 1.8* 1.8* 1.7*   Recent Labs  Lab 08/04/18 2141  LIPASE 45   No results for input(s): AMMONIA in the last 168 hours. Coagulation Profile: No results for input(s): INR, PROTIME in the last 168 hours. Cardiac Enzymes: No results for input(s): CKTOTAL, CKMB, CKMBINDEX, TROPONINI in the last 168 hours. BNP (last 3 results) No results for input(s): PROBNP in the last 8760 hours. HbA1C: No results for input(s): HGBA1C in the last 72 hours. CBG: No results for input(s): GLUCAP in the last 168 hours. Lipid Profile: No results for input(s): CHOL, HDL, LDLCALC, TRIG,  CHOLHDL, LDLDIRECT in the last 72 hours. Thyroid Function Tests: No results for input(s): TSH, T4TOTAL, FREET4, T3FREE, THYROIDAB in the last 72 hours. Anemia Panel: No results for input(s): VITAMINB12, FOLATE, FERRITIN, TIBC, IRON, RETICCTPCT in the last 72 hours. Urine analysis:    Component Value Date/Time   COLORURINE YELLOW 08/04/2018 2135   APPEARANCEUR HAZY (A) 08/04/2018 2135   LABSPEC 1.018 08/04/2018 2135   PHURINE 6.0 08/04/2018 2135   GLUCOSEU NEGATIVE 08/04/2018 2135   HGBUR NEGATIVE 08/04/2018 2135   BILIRUBINUR NEGATIVE 08/04/2018 2135   BILIRUBINUR Neg 05/08/2010 1553   KETONESUR NEGATIVE 08/04/2018 2135   PROTEINUR 100 (A) 08/04/2018 2135   UROBILINOGEN 1.0 11/11/2013 0150   NITRITE NEGATIVE 08/04/2018 2135   LEUKOCYTESUR NEGATIVE 08/04/2018 2135     Emily Hicks M.D. [facility] 08/09/2018, 12:56 PM  Pager: [phone] Between 7am to 7pm - call Pager - [phone]  After 7pm go to [email or web address] - password [email or web address]  Call night coverage person covering after 7pm

## 2018-08-09 NOTE — Progress Notes (Signed)
Pt refused ALL lab ordered and meds. She initially took med then refused. Will continue to offer med to pt as appropriate. SRP, RN

## 2018-08-09 NOTE — Progress Notes (Signed)
Have spoken with the patient she is refusing ALL lab draws at this time. SRP, RN

## 2018-08-09 NOTE — Progress Notes (Signed)
Enteric Prec discontinued, c-diff negative. SRP, RN

## 2018-08-09 NOTE — Progress Notes (Signed)
CRITICAL VALUE ALERT  Critical Value:  WBC 1.4  Date & Time Notied:08/09/18;2045  Provider Notified: Yes  Orders Received/Actions taken: Awaiting any new orders

## 2018-08-10 ENCOUNTER — Ambulatory Visit: Payer: Self-pay | Admitting: Neurology

## 2018-08-10 LAB — CULTURE, BLOOD (ROUTINE X 2)
Culture: NO GROWTH
Culture: NO GROWTH
Special Requests: ADEQUATE
Special Requests: ADEQUATE

## 2018-08-10 LAB — CBC WITH DIFFERENTIAL/PLATELET
Abs Immature Granulocytes: 0.05 10*3/uL (ref 0.00–0.07)
Basophils Absolute: 0 10*3/uL (ref 0.0–0.1)
Basophils Relative: 0 %
Eosinophils Absolute: 0 10*3/uL (ref 0.0–0.5)
Eosinophils Relative: 0 %
HCT: 26 % — ABNORMAL LOW (ref 36.0–46.0)
Hemoglobin: 7.8 g/dL — ABNORMAL LOW (ref 12.0–15.0)
Immature Granulocytes: 4 %
Lymphocytes Relative: 27 %
Lymphs Abs: 0.3 10*3/uL — ABNORMAL LOW (ref 0.7–4.0)
MCH: 27.8 pg (ref 26.0–34.0)
MCHC: 30 g/dL (ref 30.0–36.0)
MCV: 92.5 fL (ref 80.0–100.0)
Monocytes Absolute: 0.1 10*3/uL (ref 0.1–1.0)
Monocytes Relative: 10 %
Neutro Abs: 0.7 10*3/uL — ABNORMAL LOW (ref 1.7–7.7)
Neutrophils Relative %: 59 %
Platelets: 155 10*3/uL (ref 150–400)
RBC: 2.81 MIL/uL — ABNORMAL LOW (ref 3.87–5.11)
RDW: 19.8 % — ABNORMAL HIGH (ref 11.5–15.5)
WBC: 1.1 10*3/uL — CL (ref 4.0–10.5)
nRBC: 0 % (ref 0.0–0.2)

## 2018-08-10 LAB — BASIC METABOLIC PANEL
Anion gap: 8 (ref 5–15)
BUN: 12 mg/dL (ref 6–20)
CO2: 15 mmol/L — ABNORMAL LOW (ref 22–32)
Calcium: 7.2 mg/dL — ABNORMAL LOW (ref 8.9–10.3)
Chloride: 118 mmol/L — ABNORMAL HIGH (ref 98–111)
Creatinine, Ser: 1.06 mg/dL — ABNORMAL HIGH (ref 0.44–1.00)
GFR calc Af Amer: >60 mL/min (ref >60)
GFR calc non Af Amer: >60 mL/min (ref >60)
Glucose, Bld: 88 mg/dL (ref 70–99)
Potassium: 3.5 mmol/L (ref 3.5–5.1)
Sodium: 141 mmol/L (ref 135–145)

## 2018-08-10 LAB — MYCOPLASMA PNEUMONIAE ANTIBODY, IGM: Mycoplasma pneumo IgM: <770 U/mL (ref 0–769)

## 2018-08-10 MED ORDER — SODIUM CHLORIDE (PF) 0.9 % IJ SOLN
INTRAMUSCULAR | Status: AC
  Filled 2018-08-10: qty 50

## 2018-08-10 NOTE — Progress Notes (Signed)
Occupational Therapy Treatment Patient Details Name: Emily Hicks MRN: [id number] DOB: 1985/08/03 Today's Date: 08/10/2018    History of present illness This 33 year old female was admitted for progressive weakness and AMS.  She was recently dxd with MS   OT comments  Overlapped session with PT. Ambulated in hall with min A, HR up to 134.  Educated on and introduced AE for [employer]  Pt continues to feel weak, but she participated well.     Follow Up Recommendations  CIR    Equipment Recommendations  3 in 1 bedside commode    Recommendations for Other Services      Precautions / Restrictions Precautions Precautions: Fall Precaution Comments: monitor HR Restrictions Weight Bearing Restrictions: No       Mobility Bed Mobility Overal bed mobility: Needs Assistance Bed Mobility: Supine to Sit     Supine to sit: Mod assist     General bed mobility comments: to EOB with PT  Transfers Overall transfer level: Needs assistance Equipment used: Rolling walker (2 wheeled) Transfers: Sit to/from Stand Sit to Stand: Min assist Stand pivot transfers: Min assist       General transfer comment: min A to rise, VCs hand placement    Balance   Sitting-balance support: Feet supported;No upper extremity supported Sitting balance-Leahy Scale: Fair     Standing balance support: Bilateral upper extremity supported Standing balance-Leahy Scale: Poor Standing balance comment: relies on BUE support                           ADL either performed or assessed with clinical judgement   ADL                                         General ADL Comments: educated on AE and practiced with this from chair.  Pt more successful with reacher than sock aide--it was difficult for her to both don sock and to pull sock aide up leg.  Pt had a smear on bed pad. She did not feel she could wipe herself due to weakness     Vision       Perception     Praxis       Cognition Arousal/Alertness: Awake/alert Behavior During Therapy: Flat affect                                   General Comments: followed all commands; flat affect        Exercises Other Exercises Other Exercises: reminded pt to perform AROM exercises on her own   Shoulder Instructions       General Comments HR up to 134 this session    Pertinent Vitals/ Pain       Pain Assessment: No/denies pain  Home Living                                          Prior Functioning/Environment              Frequency  Min 2X/week        Progress Toward Goals  OT Goals(current goals can now be found in the care plan section)     Acute Rehab  OT Goals Patient Stated Goal: none stated; agreeable to therapy  Plan      Co-evaluation      Reason for Co-Treatment: For patient/therapist safety PT goals addressed during session: Mobility/safety with mobility;Proper use of DME        AM-PAC OT "6 Clicks" Daily Activity     Outcome Measure   Help from another person eating meals?: A Little Help from another person taking care of personal grooming?: A Little Help from another person toileting, which includes using toliet, bedpan, or urinal?: Total Help from another person bathing (including washing, rinsing, drying)?: A Lot Help from another person to put on and taking off regular upper body clothing?: A Lot Help from another person to put on and taking off regular lower body clothing?: A Lot 6 Click Score: 13    End of Session    OT Visit Diagnosis: Muscle weakness (generalized) (M62.81)   Activity Tolerance Patient tolerated treatment well   Patient Left in chair;with call bell/phone within reach;with chair alarm set   Nurse Communication          Time: 1356-1415(overlapped with PT) OT Time Calculation (min): 19 min  Charges: OT General Charges $OT Visit: 1 Visit OT Treatments $Self Care/Home Management : 8-22  mins  Lesle Chris, OTR/L Acute Rehabilitation Services [phone] [facility] pager [phone] office 08/10/2018   [place] 08/10/2018, 3:13 PM

## 2018-08-10 NOTE — Progress Notes (Addendum)
Physical Therapy Treatment Patient Details Name: Emily Hicks MRN: [id number] DOB: 27-Aug-1985 Today's Date: 08/10/2018    History of Present Illness This 33 year old female was admitted for progressive weakness and AMS.  She was recently dxd with MS    PT Comments    Pt tolerated increased ambulation distance of 65' today, HR 139 max with ambulation. Distance limited by fatigue. Pt continues to have flat affect. B lips are swollen, she reports tooth pain only when she's attempting to eat.  She puts forth good effort with mobility.   Follow Up Recommendations  CIR     Equipment Recommendations       Recommendations for Other Services       Precautions / Restrictions Precautions Precautions: Fall Precaution Comments: monitor HR Restrictions Weight Bearing Restrictions: No    Mobility  Bed Mobility Overal bed mobility: Needs Assistance Bed Mobility: Supine to Sit     Supine to sit: Mod assist     General bed mobility comments: mod A to raise trunk  Transfers Overall transfer level: Needs assistance Equipment used: Rolling walker (2 wheeled) Transfers: Sit to/from Stand Sit to Stand: Min assist         General transfer comment: min A to rise, VCs hand placement  Ambulation/Gait Ambulation/Gait assistance: Supervision;Min guard;+2 safety/equipment(+2 to follow with recliner) Gait Distance (Feet): 65 Feet Assistive device: Rolling walker (2 wheeled) Gait Pattern/deviations: Step-to pattern;Decreased step length - right;Decreased step length - left;Wide base of support Gait velocity: decreased   General Gait Details: no loss of balance,  + 2 assist for safety such that recliner was following.  Distance limited by fatigue, HR 139 max with ambulation   Stairs             Wheelchair Mobility    Modified Rankin (Stroke Patients Only)       Balance   Sitting-balance support: Feet supported;No upper extremity supported Sitting balance-Leahy Scale:  Fair     Standing balance support: Bilateral upper extremity supported Standing balance-Leahy Scale: Poor Standing balance comment: relies on BUE support                            Cognition Arousal/Alertness: Awake/alert Behavior During Therapy: Flat affect                                   General Comments: followed all commands; flat affect      Exercises      General Comments        Pertinent Vitals/Pain      Home Living                      Prior Function            PT Goals (current goals can now be found in the care plan section) Acute Rehab PT Goals Patient Stated Goal: none stated; agreeable to therapy PT Goal Formulation: With patient Time For Goal Achievement: 08/12/18 Potential to Achieve Goals: Good Progress towards PT goals: Progressing toward goals    Frequency    Min 4X/week      PT Plan Current plan remains appropriate    Co-evaluation PT/OT/SLP Co-Evaluation/Treatment: Yes Reason for Co-Treatment: For patient/therapist safety PT goals addressed during session: Mobility/safety with mobility;Proper use of DME        AM-PAC PT "6 Clicks" Mobility  Outcome Measure  Help needed turning from your back to your side while in a flat bed without using bedrails?: A Little Help needed moving from lying on your back to sitting on the side of a flat bed without using bedrails?: A Little Help needed moving to and from a bed to a chair (including a wheelchair)?: A Little Help needed standing up from a chair using your arms (e.g., wheelchair or bedside chair)?: A Little Help needed to walk in hospital room?: A Little Help needed climbing 3-5 steps with a railing? : A Lot 6 Click Score: 17    End of Session Equipment Utilized During Treatment: Gait belt Activity Tolerance: Patient limited by fatigue;No increased pain Patient left: in chair;with call bell/phone within reach;with chair alarm set Nurse  Communication: Mobility status PT Visit Diagnosis: Other abnormalities of gait and mobility (R26.89);Difficulty in walking, not elsewhere classified (R26.2)     Time: 9326-7124 PT Time Calculation (min) (ACUTE ONLY): 22 min  Charges:  $Gait Training: 8-22 mins                     Blondell Reveal Kistler PT 08/10/2018  Acute Rehabilitation Services Pager [phone] Office [phone]

## 2018-08-10 NOTE — Progress Notes (Signed)
[facility]                                                                              Patient Demographics  Emily Hicks, is a 33 y.o. female, DOB - 06/01/1985, ZOX:[id number]  Admit date - 07/27/2018   Admitting Physician Burnadette Pop, MD  Outpatient Primary MD for the patient is Sandre Kitty, MD  Outpatient specialists:   LOS - 14  days   Medical records reviewed and are as summarized below:    Chief Complaint  Patient presents with   Fever       Brief summary  Emily Hicks is an 33 y.o. female with medical history significant ofmultiple sclerosis, depression who was brought by her husband to the emergency department for the evaluation of altered mental status, fever, generalized weakness. H/o MS off aubagio a week ago due to intolerance, presents with Confusion, weakness, loss of appetite, fever, sepsis.  Found to have severe oropharyngeal thrush with odynophagia.  Emily Hicks has much improved, odynophagia has resolved.  Remittent fever on 08/03/2018.  Awaiting CIR.   Assessment & Plan    Sepsis, likely due to oropharyngeal candidiasis and esophageal Candida, intermittent fevers - COVID-19 screening negative, Initial w/u negative. Blood cultures negative so far.  Urine culture negative.  Chest x-ray showed no acute infiltrates.  No meningismal signs. - CMV IgM elevated at 240, no CMV DNA detected - HIV/RPR negative, HSV PCR negative, EBV IgG+ but IgM-  - reviewed recent CT chest, abd and pelvis-> multifocal PNA, peripancreatic fluid, pancreatitis, vs mesenteric adenitis. Started on IV Zosyn on 7/1 but discontinued by ID -Repeat LFTs, lipase normal.  No UTI. -mycoplasma serologies negative. No UTI. -2D echo 6/29 showed no endocarditis - BCID 1/2 + coagulase-negative staph, likely contaminant.  -C. difficile negative. -Continue Diflucan, prophylactic per ID -CT maxillofacial showed no dental abscess or caries.  ID following, discontinued  amoxicillin. -Still continues to spike low-grade fevers, ID following  Pancytopenia with leukopenia, anemia and thrombocytopenia: New -Possibly due to sepsis, unclear etiology,aubagio (MS drug) can cause pancytopenia with myelosuppression however patient has been off of it for at least 3 weeks and this pancytopenia is new. -Hemoglobin 7.8, WBCs 1.1 today, anemia panel with elevated ferritin 3043, likely reactive due to underlying infectious process -Follow FOBT, discussed in detail with Dr. Myrle Sheng today, work-up in progress.  Hypernatremia -Sodium 157 at the time of admission -Likely due to dehydration, currently sodium stable -Sodium is stable  Hypokalemia, hypomagnesemia -Potassium 3.5  Severe hypophosphatemia -Phosphorus improving, continue supplement   Hypocalcemia -Corrected calcium stable  Hypoalbuminemia, severe protein calorie malnutrition -Albumin 1.7, received 3 doses of IV albumin on 7/5, continue pro-stat  Sinus tachycardia TSH 0.9, likely due to #1 Continue beta-blocker.  CT chest on 6/28 was negative for Hicks.  Acute kidney injury -Presented with creatinine of 1.2 -Improved with IV fluid hydration  Transaminitis -Possible side effect from aubagio -AST improving  History of multiple sclerosis -Recently stopped aubagio a week prior to admission due to pancytopenia, abnormal liver function -MRI of the brain showed no acute findings. -Per neurology, Dr. Laurence Slate generalized weakness likely pseudo-exacerbation due to acute  illness/sepsis  Generalized weakness - MRI of the lumbar spine showed no acute abnormality. - Plan for CIR when afebrile for 24 hours -There was a question of possible some component of conversion disorder versus cognitive deficits related to MS/sepsis.  Per PT, inconsistent with physical presentation, able to ambulate to bathroom then unable to stand, able to move upper extremities and then unable to feed self. -Lower extremity venous Dopplers  negative for DVT  Obesity BMI 37.0   Code Status: full  DVT Prophylaxis:  Lovenox Family Communication: Discussed in detail with the patient, all imaging results, lab results explained to the patient's husband on the phone on 7/5   Disposition Plan: CIR when bed available however currently new issue of pancytopenia, work-up in process  Time Spent in minutes : 25 minutes  Procedures:  none   Consultants:   Infectious disease GI  Hematology oncology, Dr. Myrle Sheng  Antimicrobials:   Anti-infectives (From admission, onward)   Start     Dose/Rate Route Frequency Ordered Stop   08/14/18 0859  fluconazole (DIFLUCAN) tablet 100 mg     100 mg Oral Weekly 08/07/18 1128     08/08/18 0815  amoxicillin (AMOXIL) capsule 500 mg  Status:  Discontinued     500 mg Oral Every 8 hours 08/08/18 0745 08/08/18 1409   08/06/18 1000  fluconazole (DIFLUCAN) tablet 100 mg  Status:  Discontinued     100 mg Oral Daily 08/05/18 1643 08/07/18 1128   08/04/18 2200  piperacillin-tazobactam (ZOSYN) IVPB 3.375 g  Status:  Discontinued     3.375 g 12.5 mL/hr over 240 Minutes Intravenous Every 8 hours 08/04/18 2156 08/05/18 1643   08/03/18 1000  fluconazole (DIFLUCAN) tablet 200 mg  Status:  Discontinued     200 mg Oral Daily 08/02/18 1500 08/05/18 1643   07/28/18 1700  vancomycin (VANCOCIN) IVPB 750 mg/150 ml premix  Status:  Discontinued     750 mg 150 mL/hr over 60 Minutes Intravenous Every 24 hours 07/27/18 1606 07/28/18 0947   07/28/18 1000  fluconazole (DIFLUCAN) IVPB 200 mg  Status:  Discontinued     200 mg 100 mL/hr over 60 Minutes Intravenous Every 24 hours 07/28/18 0954 08/02/18 1500   07/27/18 1800  ceFEPIme (MAXIPIME) 2 g in sodium chloride 0.9 % 100 mL IVPB  Status:  Discontinued     2 g 200 mL/hr over 30 Minutes Intravenous Every 8 hours 07/27/18 1606 08/01/18 1414   07/27/18 1730  fluconazole (DIFLUCAN) tablet 200 mg  Status:  Discontinued     200 mg Oral  Once 07/27/18 1717 07/28/18 0953    07/27/18 1615  vancomycin (VANCOCIN) 1,500 mg in sodium chloride 0.9 % 500 mL IVPB     1,500 mg 250 mL/hr over 120 Minutes Intravenous  Once 07/27/18 1606 07/27/18 1851   07/27/18 1415  cefTRIAXone (ROCEPHIN) 1 g in sodium chloride 0.9 % 100 mL IVPB     1 g 200 mL/hr over 30 Minutes Intravenous  Once 07/27/18 1404 07/27/18 1642         Medications  Scheduled Meds:  feeding supplement  1 Container Oral Q24H   feeding supplement (ENSURE ENLIVE)  237 mL Oral Q24H   feeding supplement (PRO-STAT SUGAR FREE 64)  30 mL Oral BID   [START ON 08/14/2018] fluconazole  100 mg Oral Weekly   folic acid  2 mg Oral Daily   free water  300 mL Oral Q8H   hydrocortisone  25 mg Rectal BID   lidocaine  15 mL Mouth/Throat TID AC & HS   magnesium oxide  400 mg Oral BID   metoprolol tartrate  25 mg Oral BID   multivitamin with minerals  1 tablet Oral Daily   nystatin  5 mL Oral QID   phosphorus  500 mg Oral TID   sodium chloride flush  3 mL Intravenous Once   Continuous Infusions:  PRN Meds:.acetaminophen, benzocaine, HYDROmorphone (DILAUDID) injection, lip balm, loperamide, LORazepam, metoprolol tartrate, ondansetron (ZOFRAN) IV, sodium chloride flush      Subjective:   Emily Hicks was seen and examined today.  Low-grade temp this morning 100.5 F.  Patient however feels she is symptomatically improving from admission.  No nausea vomiting abdominal pain or diarrhea.  No coughing, shortness of breath or chest pain.    Objective:   Vitals:   08/09/18 2157 08/10/18 0627 08/10/18 0824 08/10/18 1047  BP: 109/76 104/77 107/63 114/77  Pulse: (!) 106 (!) 119 (!) 117 (!) 110  Resp: Temp: 98.8 F (37.1 C) (!) 100.5 F (38.1 C) 99.3 F (37.4 C) 98.4 F (36.9 C)  TempSrc: Oral Axillary Oral Oral  SpO2: 98% 100% 93% 100%  Weight:      Height:        Intake/Output Summary (Last 24 hours) at 08/10/2018 1323 Last data filed at 08/10/2018 0600 Gross per 24 hour  Intake  180 ml  Output 350 ml  Net -170 ml     Wt Readings from Last 3 Encounters:  07/31/18 89 kg  07/16/18 72.6 kg  07/06/18 90.7 kg    Physical Exam  General: Alert and oriented x 3, NAD  Eyes:   HEENT:  Atraumatic, normocephalic  Cardiovascular: S1 S2 clear, no murmurs, RRR. No pedal edema b/l  Respiratory: CTAB, no wheezing, rales or rhonchi  Gastrointestinal: Soft, nontender, nondistended, NBS  Ext: no pedal edema bilaterally  Neuro: no new deficits  Musculoskeletal: No cyanosis, clubbing  Skin: No rashes  Psych: Normal affect and demeanor, alert and oriented x3      Data Reviewed:  I have personally reviewed following labs and imaging studies  Micro Results Recent Results (from the past 240 hour(s))  Urine Culture     Status: None   Collection Time: 08/04/18  9:35 PM   Specimen: Urine, Clean Catch  Result Value Ref Range Status   Specimen Description   Final    URINE, CLEAN CATCH Performed at [facility], [address] [place], [place] [address]    Special Requests   Final    NONE Performed at [facility], [address] [place], [place] [address]    Culture   Final    NO GROWTH Performed at [facility], [address] [place], [place] [address]    Report Status 08/06/2018 FINAL  Final  Culture, blood (routine x 2)     Status: None   Collection Time: 08/04/18  9:41 PM   Specimen: BLOOD  Result Value Ref Range Status   Specimen Description   Final    BLOOD BLOOD RIGHT ARM Performed at [facility], [address], [place], [place] [address]    Special Requests   Final    BOTTLES DRAWN AEROBIC ONLY Blood Culture adequate volume Performed at [facility], [address], [place], [place] [address]    Culture   Final    NO GROWTH 5 DAYS Performed at [facility], [address] [place], [place] [address]    Report Status 08/10/2018 FINAL  Final   Culture, blood (routine x 2)     Status: Abnormal   Collection Time: 08/04/18  9:46 PM   Specimen: BLOOD  Result Value Ref Range Status   Specimen Description   Final    BLOOD BLOOD LEFT HAND Performed at [facility] [address] [place], [place] [address]    Special Requests   Final    BOTTLES DRAWN AEROBIC AND ANAEROBIC Blood Culture adequate volume Performed at [facility] [address], [place], [place] [address]    Culture  Setup Time   Final    AEROBIC BOTTLE ONLY GRAM POSITIVE COCCI CRITICAL RESULT CALLED TO, READ BACK BY AND VERIFIED WITH: J GRIMSLEY PHARMD 08/06/18 0400 JDW    Culture (A)  Final    STAPHYLOCOCCUS SPECIES (COAGULASE NEGATIVE) THE SIGNIFICANCE OF ISOLATING THIS ORGANISM FROM A SINGLE SET OF BLOOD CULTURES WHEN MULTIPLE SETS ARE DRAWN IS UNCERTAIN. PLEASE NOTIFY THE MICROBIOLOGY DEPARTMENT WITHIN ONE WEEK IF SPECIATION AND SENSITIVITIES ARE REQUIRED. Performed at [facility], [address] [place], [place] [address]    Report Status 08/07/2018 FINAL  Final  Blood Culture ID Panel (Reflexed)     Status: Abnormal   Collection Time: 08/04/18  9:46 PM  Result Value Ref Range Status   Enterococcus species NOT DETECTED NOT DETECTED Final   Listeria monocytogenes NOT DETECTED NOT DETECTED Final   Staphylococcus species DETECTED (A) NOT DETECTED Final    Comment: Methicillin (oxacillin) resistant coagulase negative staphylococcus. Possible blood culture contaminant (unless isolated from more than one blood culture draw or clinical case suggests pathogenicity). No antibiotic treatment is indicated for blood  culture contaminants. CRITICAL RESULT CALLED TO, READ BACK BY AND VERIFIED WITH: J GRIMSLEY PHARMD 08/06/18 0400 JDW    Staphylococcus aureus (BCID) NOT DETECTED NOT DETECTED Final   Methicillin resistance DETECTED (A) NOT DETECTED Final    Comment: CRITICAL RESULT CALLED TO, READ BACK BY AND VERIFIED WITH: J  GRIMSLEY PHARMD 08/06/18 0400 JDW    Streptococcus species NOT DETECTED NOT DETECTED Final   Streptococcus agalactiae NOT DETECTED NOT DETECTED Final   Streptococcus pneumoniae NOT DETECTED NOT DETECTED Final   Streptococcus pyogenes NOT DETECTED NOT DETECTED Final   Acinetobacter baumannii NOT DETECTED NOT DETECTED Final   Enterobacteriaceae species NOT DETECTED NOT DETECTED Final   Enterobacter cloacae complex NOT DETECTED NOT DETECTED Final   Escherichia coli NOT DETECTED NOT DETECTED Final   Klebsiella oxytoca NOT DETECTED NOT DETECTED Final   Klebsiella pneumoniae NOT DETECTED NOT DETECTED Final   Proteus species NOT DETECTED NOT DETECTED Final   Serratia marcescens NOT DETECTED NOT DETECTED Final   Haemophilus influenzae NOT DETECTED NOT DETECTED Final   Neisseria meningitidis NOT DETECTED NOT DETECTED Final   Pseudomonas aeruginosa NOT DETECTED NOT DETECTED Final   Candida albicans NOT DETECTED NOT DETECTED Final   Candida glabrata NOT DETECTED NOT DETECTED Final   Candida krusei NOT DETECTED NOT DETECTED Final   Candida parapsilosis NOT DETECTED NOT DETECTED Final   Candida tropicalis NOT DETECTED NOT DETECTED Final    Comment: Performed at [facility], [place] [place], [place] [address]  Culture, blood (routine x 2)     Status: None   Collection Time: 08/05/18  6:59 PM   Specimen: BLOOD  Result Value Ref Range Status   Specimen Description   Final    BLOOD LEFT HAND  Performed at [facility], [address] [place], [place] [address]    Special Requests   Final    BOTTLES DRAWN AEROBIC ONLY Blood Culture adequate volume Performed at [facility], [address] [place], [place] [address]    Culture   Final    NO GROWTH 5 DAYS Performed at [facility], [address] [place], [place] [address]    Report Status 08/10/2018 FINAL  Final  C difficile quick scan w PCR reflex     Status: None   Collection Time:  08/07/18 11:29 AM   Specimen: STOOL  Result Value Ref Range Status   C Diff antigen NEGATIVE NEGATIVE Final   C Diff toxin NEGATIVE NEGATIVE Final   C Diff interpretation No C. difficile detected.  Final    Comment: Performed at [facility], [address] [place], [place] [address]  Culture, blood (Routine X 2) w Reflex to ID Panel     Status: None (Preliminary result)   Collection Time: 08/09/18  7:12 PM   Specimen: BLOOD LEFT HAND  Result Value Ref Range Status   Specimen Description   Final    BLOOD LEFT HAND Performed at [facility], [address] [place], [place] [address]    Special Requests   Final    BOTTLES DRAWN AEROBIC ONLY Blood Culture adequate volume Performed at [facility], [address] [place], [place] [address]    Culture   Final    NO GROWTH < 12 HOURS Performed at [facility], [address] [place], [place] [address]    Report Status PENDING  Incomplete  Culture, blood (Routine X 2) w Reflex to ID Panel     Status: None (Preliminary result)   Collection Time: 08/09/18  7:12 PM   Specimen: BLOOD LEFT HAND  Result Value Ref Range Status   Specimen Description   Final    BLOOD LEFT HAND Performed at [facility], [address] [place], [place] [address]    Special Requests   Final    BOTTLES DRAWN AEROBIC ONLY Blood Culture adequate volume Performed at [facility], [address] [place], [place] [address]    Culture   Final    NO GROWTH < 12 HOURS Performed at [facility], [address] [place], [place] [address]    Report Status PENDING  Incomplete    Radiology Reports Dg Chest 2 View  Result Date: 08/05/2018 CLINICAL DATA:  Fever of unknown origin, multiple sclerosis EXAM: CHEST - 2 VIEW COMPARISON:  07/27/2018 FINDINGS: Borderline enlargement of cardiac silhouette. Mediastinal contours and pulmonary vascularity normal.  Bibasilar opacities question atelectasis versus infiltrate. Lungs otherwise clear. No pleural effusion or pneumothorax. Bones unremarkable. IMPRESSION: Bibasilar opacities question atelectasis versus infiltrate. Electronically Signed   By: Ulyses SouthwardMark  Boles M.D.   On: 08/05/2018 08:27   Dg Chest 2 View  Result Date: 07/27/2018 CLINICAL DATA:  Fever and malaise EXAM: CHEST - 2 VIEW COMPARISON:  July 16, 2018 FINDINGS: Lungs are clear. The heart size and pulmonary vascularity are normal. No adenopathy. No bone lesions. IMPRESSION: No edema or consolidation. Electronically Signed   By: Bretta BangWilliam  Woodruff III M.D.   On: 07/27/2018 15:00   Ct Chest W Contrast  Result Date: 08/02/2018 CLINICAL DATA:  Fever of unknown origin. EXAM: CT CHEST, ABDOMEN, AND PELVIS WITH CONTRAST TECHNIQUE: Multidetector CT imaging of the chest, abdomen and  pelvis was performed following the standard protocol during bolus administration of intravenous contrast. CONTRAST:  OMNIPAQUE IOHEXOL 300 MG/ML SOLN, 30mL OMNIPAQUE IOHEXOL 300 MG/ML SOLN COMPARISON:  None. FINDINGS: CT CHEST FINDINGS Cardiovascular: There is no large centrally located pulmonary embolus. Detection of smaller segmental and subsegmental pulmonary emboli is limited by contrast bolus timing. The main pulmonary artery is within normal limits for size. There is no CT evidence of acute right heart strain. The visualized aorta is normal. Heart size is normal. There is a trace pericardial effusion. Mediastinum/Nodes: --there are mildly prominent mediastinal and hilar lymph nodes bilaterally. --there is mild bilateral axillary adenopathy. --No supraclavicular lymphadenopathy. --Normal thyroid gland. --The esophagus is unremarkable Lungs/Pleura: There are trace bilateral pleural effusions with adjacent atelectasis. There is scattered ground-glass airspace opacities bilaterally, greatest within the right upper lobe. There is no pneumothorax. The trachea is unremarkable.  Musculoskeletal: No chest wall abnormality. No acute or significant osseous findings. 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 CT ABDOMEN PELVIS FINDINGS Hepatobiliary: The liver is borderline enlarged. Status post cholecystectomy.There is no biliary ductal dilation. Pancreas: There is some mild peripancreatic fat stranding. Spleen: There is a small wedge-shaped defect in the lower pole the spleen measuring approximately 1.8 cm (axial series 2 image 47). Adrenals/Urinary Tract: --Adrenal glands: No adrenal hemorrhage. --Right kidney/ureter: No hydronephrosis or perinephric hematoma. --Left kidney/ureter: No hydronephrosis or perinephric hematoma. --Urinary bladder: Unremarkable. Stomach/Bowel: --Stomach/Duodenum: No hiatal hernia or other gastric abnormality. Normal duodenal course and caliber. --Small bowel: No dilatation or inflammation. --Colon: No focal abnormality. --Appendix: Normal. Vascular/Lymphatic: Normal course and caliber of the major abdominal vessels. There is a retroaortic left renal vein, a normal variant. --there are prominent retroperitoneal lymph nodes. --there are prominent mesenteric lymph nodes. There is haziness throughout the mesentery. There are enlarged right lower quadrant lymph nodes measuring approximately 1.1 cm in diameter. --there are mildly enlarged pelvic and inguinal lymph nodes. Reproductive: There is a small amount of free fluid in the pelvis which may be physiologic. There is no discrete ovarian mass. Other: There is a small volume of pelvic free fluid which is likely physiologic. No free air. There is some mild fat stranding involving the anterior abdominal wall bilaterally. Musculoskeletal. No acute displaced fractures. IMPRESSION: 1. Scattered ground-glass airspace opacities, greatest within the right upper lobe, concerning for multifocal pneumonia. 2. Small bilateral pleural effusions with adjacent atelectasis. 3. Mild diffuse adenopathy  throughout the chest, abdomen, and pelvis of unknown clinical significance. 4. Peripancreatic free fluid with edema within the root of the mesentery. Findings are nonspecific and can be seen in patients with pancreatitis, versus less likely sclerosing mesenteritis or inflammatory bowel disease. There are some prominent lymph nodes right lower quadrant which can be seen in patients with mesenteric adenitis. 5. Normal appendix in the right lower quadrant. 6. Small volume free fluid in the pelvis favored to be physiologic. 7. Wedge-shaped hypoattenuating defect in the spleen of unknown clinical significance. Differential considerations include a splenic infarct versus less likely a splenic laceration, especially in the absence of a history of trauma. There is no perisplenic hematoma. Electronically Signed   By: Katherine Mantle M.D.   On: 08/02/2018 00:18   Mr Brain Wo Contrast  Result Date: 07/28/2018 CLINICAL DATA:  Encephalopathy with hallucinations EXAM: MRI HEAD WITHOUT CONTRAST TECHNIQUE: Multiplanar, multiecho pulse sequences of the brain and surrounding structures were obtained without intravenous contrast. COMPARISON:  Brain MRI 03/24/2018 FINDINGS: BRAIN: There is no acute infarct, acute hemorrhage or  extra-axial collection. The midline structures are normal. The white matter signal is normal for the patient's age. The cerebral and cerebellar volume are age-appropriate. No hydrocephalus. Susceptibility-sensitive sequences show no chronic microhemorrhage or superficial siderosis. No midline shift or other mass effect. VASCULAR: The major intracranial arterial and venous sinus flow voids are normal. SKULL AND UPPER CERVICAL SPINE: Calvarial bone marrow signal is normal. There is no skull base mass. Visualized upper cervical spine and soft tissues are normal. SINUSES/ORBITS: No fluid levels or advanced mucosal thickening. No mastoid or middle ear effusion. The orbits are normal. IMPRESSION: Normal brain.  Electronically Signed   By: Deatra Robinson M.D.   On: 07/28/2018 00:13   Mr Lumbar Spine Wo Contrast  Result Date: 08/02/2018 CLINICAL DATA:  Fever. Lower extremity weakness. History of multiple sclerosis. EXAM: MRI LUMBAR SPINE WITHOUT CONTRAST TECHNIQUE: Multiplanar, multisequence MR imaging of the lumbar spine was performed. No intravenous contrast was administered. COMPARISON:  CT abdomen pelvis from yesterday. FINDINGS: Despite efforts by the technologist and patient, motion artifact is present on today's exam and could not be eliminated. This reduces exam sensitivity and specificity. Segmentation:  Standard. Alignment:  Physiologic. Vertebrae:  No fracture, evidence of discitis, or bone lesion. Conus medullaris and cauda equina: Conus extends to the L1-L2 level. Conus and cauda equina appear normal. Paraspinal and other soft tissues: Symmetric bilateral paraspinous muscle edema. Otherwise negative. Disc levels: No significant disc bulge or herniation. No spinal canal or neuroforaminal stenosis. IMPRESSION: 1.  No acute abnormality or significant degenerative changes. Electronically Signed   By: Obie Dredge M.D.   On: 08/02/2018 09:14   Ct Abdomen Pelvis W Contrast  Result Date: 08/02/2018 CLINICAL DATA:  Fever of unknown origin. EXAM: CT CHEST, ABDOMEN, AND PELVIS WITH CONTRAST TECHNIQUE: Multidetector CT imaging of the chest, abdomen and pelvis was performed following the standard protocol during bolus administration of intravenous contrast. CONTRAST:  OMNIPAQUE IOHEXOL 300 MG/ML SOLN, 30mL OMNIPAQUE IOHEXOL 300 MG/ML SOLN COMPARISON:  None. FINDINGS: CT CHEST FINDINGS Cardiovascular: There is no large centrally located pulmonary embolus. Detection of smaller segmental and subsegmental pulmonary emboli is limited by contrast bolus timing. The main pulmonary artery is within normal limits for size. There is no CT evidence of acute right heart strain. The visualized aorta is normal. Heart size  is normal. There is a trace pericardial effusion. Mediastinum/Nodes: --there are mildly prominent mediastinal and hilar lymph nodes bilaterally. --there is mild bilateral axillary adenopathy. --No supraclavicular lymphadenopathy. --Normal thyroid gland. --The esophagus is unremarkable Lungs/Pleura: There are trace bilateral pleural effusions with adjacent atelectasis. There is scattered ground-glass airspace opacities bilaterally, greatest within the right upper lobe. There is no pneumothorax. The trachea is unremarkable. Musculoskeletal: No chest wall abnormality. No acute or significant osseous findings. 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 CT ABDOMEN PELVIS FINDINGS Hepatobiliary: The liver is borderline enlarged. Status post cholecystectomy.There is no biliary ductal dilation. Pancreas: There is some mild peripancreatic fat stranding. Spleen: There is a small wedge-shaped defect in the lower pole the spleen measuring approximately 1.8 cm (axial series 2 image 47). Adrenals/Urinary Tract: --Adrenal glands: No adrenal hemorrhage. --Right kidney/ureter: No hydronephrosis or perinephric hematoma. --Left kidney/ureter: No hydronephrosis or perinephric hematoma. --Urinary bladder: Unremarkable. Stomach/Bowel: --Stomach/Duodenum: No hiatal hernia or other gastric abnormality. Normal duodenal course and caliber. --Small bowel: No dilatation or inflammation. --Colon: No focal abnormality. --Appendix: Normal. Vascular/Lymphatic: Normal course and caliber of the major abdominal vessels. There is a retroaortic left renal vein, a  normal variant. --there are prominent retroperitoneal lymph nodes. --there are prominent mesenteric lymph nodes. There is haziness throughout the mesentery. There are enlarged right lower quadrant lymph nodes measuring approximately 1.1 cm in diameter. --there are mildly enlarged pelvic and inguinal lymph nodes. Reproductive: There is a small amount of free fluid in  the pelvis which may be physiologic. There is no discrete ovarian mass. Other: There is a small volume of pelvic free fluid which is likely physiologic. No free air. There is some mild fat stranding involving the anterior abdominal wall bilaterally. Musculoskeletal. No acute displaced fractures. IMPRESSION: 1. Scattered ground-glass airspace opacities, greatest within the right upper lobe, concerning for multifocal pneumonia. 2. Small bilateral pleural effusions with adjacent atelectasis. 3. Mild diffuse adenopathy throughout the chest, abdomen, and pelvis of unknown clinical significance. 4. Peripancreatic free fluid with edema within the root of the mesentery. Findings are nonspecific and can be seen in patients with pancreatitis, versus less likely sclerosing mesenteritis or inflammatory bowel disease. There are some prominent lymph nodes right lower quadrant which can be seen in patients with mesenteric adenitis. 5. Normal appendix in the right lower quadrant. 6. Small volume free fluid in the pelvis favored to be physiologic. 7. Wedge-shaped hypoattenuating defect in the spleen of unknown clinical significance. Differential considerations include a splenic infarct versus less likely a splenic laceration, especially in the absence of a history of trauma. There is no perisplenic hematoma. Electronically Signed   By: Katherine Mantlehristopher  Moger M.D.   On: 08/02/2018 00:18   Ct Maxillofacial W Contrast  Result Date: 08/08/2018 CLINICAL DATA:  Fevers.  Right mandibular molar tooth pain. EXAM: CT MAXILLOFACIAL WITH CONTRAST TECHNIQUE: Multidetector CT imaging of the maxillofacial structures was performed with intravenous contrast. Multiplanar CT image reconstructions were also generated. CONTRAST:  75mL OMNIPAQUE IOHEXOL 300 MG/ML  SOLN COMPARISON:  MRI 07/27/2018 FINDINGS: Osseous: No maxillary, mid face or upper face abnormality is seen. There is no evidence of active dental or periodontal disease. With respect to the  right mandible, teeth 25 through 31 appear normal. Tooth 32 is absent and presumably has been extracted in the distant past. There is a benign appearing focus of hazy sclerosis measuring about 1.5 cm at the angle of the mandible on the right that could represent sequela of the previous extraction. If the patient has not had extraction of tooth 32, then this could be some dysplastic change related to aplasia of the tooth. I do not see any likely significant finding in this region. Left mandibular molar remains in place, not fully erupted. Orbits: Normal Sinuses: Clear Soft tissues: Normal. No soft tissue inflammatory changes are evident. Limited intracranial: Normal IMPRESSION: No evidence of dental or periodontal decay or inflammatory disease. Absent right mandibular molar tooth. Presumably there has been distant extraction. Hazy sclerosis within the mandible at the angle of the mandible could be chronic healed sequela of the molar tooth extraction. If there is not a history of molar tooth extraction, then there could be aplasia/hypoplasia of tooth 32. Electronically Signed   By: Paulina FusiMark  Shogry M.D.   On: 08/08/2018 11:49   Dg Chest Port 1 View  Result Date: 07/16/2018 CLINICAL DATA:  Fatigue EXAM: PORTABLE CHEST 1 VIEW COMPARISON:  None. FINDINGS: The heart size and mediastinal contours are within normal limits. Both lungs are clear. The visualized skeletal structures are unremarkable. IMPRESSION: No active disease. Electronically Signed   By: Alcide CleverMark  Lukens M.D.   On: 07/16/2018 15:18   [place] Lower Extremity Venous (dvt)  Result Date: 08/06/2018  Lower Venous Study Indications: Immobility, fever.  Limitations: Patient positioning. Comparison Study: No prior Performing Technologist: Blanch Media RVS  Examination Guidelines: A complete evaluation includes B-mode imaging, spectral Doppler, color Doppler, and power Doppler as needed of all accessible portions of each vessel. Bilateral testing is considered an  integral part of a complete examination. Limited examinations for reoccurring indications may be performed as noted.  +---------+---------------+---------+-----------+----------+--------------+  RIGHT     Compressibility Phasicity Spontaneity Properties Summary         +---------+---------------+---------+-----------+----------+--------------+  CFV       Full            Yes       Yes                                    +---------+---------------+---------+-----------+----------+--------------+  SFJ       Full                                                             +---------+---------------+---------+-----------+----------+--------------+  FV Prox   Full                                                             +---------+---------------+---------+-----------+----------+--------------+  FV Mid    Full                                                             +---------+---------------+---------+-----------+----------+--------------+  FV Distal Full                                                             +---------+---------------+---------+-----------+----------+--------------+  PFV       Full                                                             +---------+---------------+---------+-----------+----------+--------------+  POP       Full            Yes       Yes                                    +---------+---------------+---------+-----------+----------+--------------+  PTV       Full                                                             +---------+---------------+---------+-----------+----------+--------------+  PERO                                                       Not visualized  +---------+---------------+---------+-----------+----------+--------------+   +---------+---------------+---------+-----------+----------+--------------+  LEFT      Compressibility Phasicity Spontaneity Properties Summary         +---------+---------------+---------+-----------+----------+--------------+  CFV        Full            Yes       Yes                                    +---------+---------------+---------+-----------+----------+--------------+  SFJ       Full                                                             +---------+---------------+---------+-----------+----------+--------------+  FV Prox   Full                                                             +---------+---------------+---------+-----------+----------+--------------+  FV Mid    Full                                                             +---------+---------------+---------+-----------+----------+--------------+  FV Distal Full                                                             +---------+---------------+---------+-----------+----------+--------------+  PFV       Full                                                             +---------+---------------+---------+-----------+----------+--------------+  POP       Full            Yes       Yes                                    +---------+---------------+---------+-----------+----------+--------------+  PTV       Full                                                             +---------+---------------+---------+-----------+----------+--------------+  PERO                                                       Not visualized  +---------+---------------+---------+-----------+----------+--------------+     Summary: Right: There is no evidence of deep vein thrombosis in the lower extremity. No cystic structure found in the popliteal fossa. Left: There is no evidence of deep vein thrombosis in the lower extremity. No cystic structure found in the popliteal fossa.  *See table(s) above for measurements and observations. Electronically signed by Lemar LivingsBrandon Cain MD on 08/06/2018 at 11:00:59 AM.    Final     Lab Data:  CBC: Recent Labs  Lab 08/05/18 1607  08/07/18 0354 08/08/18 0428 08/09/18 0947 08/09/18 1912 08/10/18 1209  WBC 2.8*   < > 2.2* 1.9* 1.3* 1.4* 1.1*  NEUTROABS  1.8  --   --   --  1.0* 0.9* 0.7*  HGB 8.6*   < > 8.1* 7.5* 7.1* 7.7* 7.8*  HCT 27.7*   < > 26.1* 24.9* 23.9* 24.7* 26.0*  MCV 90.8   < > 90.0 90.9 92.3 91.8 92.5  PLT 154   < > 172 145* 148* 163 155   < > = values in this interval not displayed.   Basic Metabolic Panel: Recent Labs  Lab 08/04/18 0245  08/06/18 0350 08/07/18 0354 08/08/18 0428 08/09/18 0947 08/10/18 1209  NA 140   < > 140 139 140 140 141  K 3.3*   < > 3.2* 3.3* 3.3* 3.5 3.5  CL 117*   < > 116* 115* 117* 117* 118*  CO2 19*   < > 17* 18* 18* 16* 15*  GLUCOSE 103*   < > 97 95 87 81 88  BUN 7   < > 6 7 10 13 12   CREATININE 0.44   < > 0.51 0.63 0.84 1.08* 1.06*  CALCIUM 6.9*   < > 7.0* 6.8* 6.4* 7.2* 7.2*  MG 1.7  --  1.5* 1.5* 1.4* 2.1  --   PHOS 1.4*  --  <1.0* 1.9* 2.8 2.7  --    < > = values in this interval not displayed.   GFR: Estimated Creatinine Clearance: 77.3 mL/min (A) (by C-G formula based on SCr of 1.06 mg/dL (H)). Liver Function Tests: Recent Labs  Lab 08/05/18 1607 08/08/18 0428 08/09/18 1912  AST 58* 87*  --   ALT 23 28  --   ALKPHOS 83 74  --   BILITOT 0.2* 0.3  --   PROT 5.2* 5.1*  --   ALBUMIN 1.8* 1.7* 2.2*   Recent Labs  Lab 08/04/18 2141  LIPASE 45   No results for input(s): AMMONIA in the last 168 hours. Coagulation Profile: No results for input(s): INR, PROTIME in the last 168 hours. Cardiac Enzymes: No results for input(s): CKTOTAL, CKMB, CKMBINDEX, TROPONINI in the last 168 hours. BNP (last 3 results) No results for input(s): PROBNP in the last 8760 hours. HbA1C: No results for input(s): HGBA1C in the last 72 hours. CBG: No results for input(s): GLUCAP in the last 168 hours. Lipid Profile: No results for input(s): CHOL, HDL, LDLCALC, TRIG, CHOLHDL, LDLDIRECT in the last 72 hours. Thyroid Function Tests: No results for input(s): TSH, T4TOTAL, FREET4, T3FREE, THYROIDAB in the last 72 hours. Anemia Panel: Recent Labs    08/09/18 1912  VITAMINB12  545  FOLATE 10.0   FERRITIN 3,043*  TIBC 153*  IRON 21*  RETICCTPCT 1.2   Urine analysis:    Component Value Date/Time   COLORURINE YELLOW 08/04/2018 2135   APPEARANCEUR HAZY (A) 08/04/2018 2135   LABSPEC 1.018 08/04/2018 2135   PHURINE 6.0 08/04/2018 2135   GLUCOSEU NEGATIVE 08/04/2018 2135   HGBUR NEGATIVE 08/04/2018 2135   BILIRUBINUR NEGATIVE 08/04/2018 2135   BILIRUBINUR Neg 05/08/2010 1553   KETONESUR NEGATIVE 08/04/2018 2135   PROTEINUR 100 (A) 08/04/2018 2135   UROBILINOGEN 1.0 11/11/2013 0150   NITRITE NEGATIVE 08/04/2018 2135   LEUKOCYTESUR NEGATIVE 08/04/2018 2135     Kamrin Spath M.D. [facility] 08/10/2018, 1:23 PM  Pager: [phone] Between 7am to 7pm - call Pager - [phone]  After 7pm go to [email or web address] - password [email or web address]  Call night coverage person covering after 7pm

## 2018-08-10 NOTE — Plan of Care (Signed)

## 2018-08-10 NOTE — Progress Notes (Addendum)
HEMATOLOGY-ONCOLOGY PROGRESS NOTE  SUBJECTIVE: Continues to have intermittent fevers. TMax 100.9. No chills. Still has swelling in her lips, but denies swelling to her tongue/mouth. States diarrhea has resolved. No bleeding.   REVIEW OF SYSTEMS:   Constitutional: Still having intermittent fevers. No chills.  Respiratory: Denies cough, dyspnea or wheezes Cardiovascular: Denies palpitation, chest discomfort Gastrointestinal:  Denies nausea, vomiting. Diarrhea has resolved.  Skin: Denies abnormal skin rashes Lymphatics: Denies new lymphadenopathy or easy bruising Neurological:Denies numbness, tingling or new weaknesses Behavioral/Psych: Mood is stable, no new changes  Extremities: No lower extremity edema All other systems were reviewed with the patient and are negative.  I have reviewed the past medical history, past surgical history, social history and family history with the patient and they are unchanged from previous note.   PHYSICAL EXAMINATION:  Vitals:   08/10/18 0824 08/10/18 1047  BP: 107/63 114/77  Pulse: (!) 117 (!) 110  Resp: 20 16  Temp: 99.3 F (37.4 C) 98.4 F (36.9 C)  SpO2: 93% 100%   Filed Weights   07/27/18 1745 07/31/18 0300  Weight: 183 lb 10.3 oz (83.3 kg) 196 lb 3.4 oz (89 kg)    Intake/Output from previous day: 07/06 0701 - 07/07 0700 In: 180 [P.O.:180] Out: 350 [Urine:350]  GENERAL:alert, no distress  SKIN: no rashes or significant lesions OROPHARYNX: Swollen lips. Mucus membranes dry. No thrush.  LUNGS: clear to auscultation and percussion with normal breathing effort HEART: Tachycardic. Regular rhythm.  ABDOMEN:abdomen soft, non-tender and normal bowel sounds NEURO: alert & oriented x 3 with fluent speech, no focal motor/sensory deficits  LABORATORY DATA:  I have reviewed the data as listed CMP Latest Ref Rng & Units 08/09/2018 08/08/2018 08/07/2018  Glucose 70 - 99 mg/dL 81 87 95  BUN 6 - 20 mg/dL _0 Creatinine 0.44 - 1.00 mg/dL 1.08(H)  0.84 0.63  Sodium 135 - 145 mmol/L 140 140 139  Potassium 3.5 - 5.1 mmol/L 3.5 3.3(L) 3.3(L)  Chloride 98 - 111 mmol/L 117(H) 117(H) 115(H)  CO2 22 - 32 mmol/L 16(L) 18(L) 18(L)  Calcium 8.9 - 10.3 mg/dL 7.2(L) 6.4(LL) 6.8(L)  Total Protein 6.5 - 8.1 g/dL - 5.1(L) -  Total Bilirubin 0.3 - 1.2 mg/dL - 0.3 -  Alkaline Phos 38 - 126 U/L - 74 -  AST 15 - 41 U/L - 87(H) -  ALT 0 - 44 U/L - 28 -    Lab Results  Component Value Date   WBC 1.4 (LL) 08/09/2018   HGB 7.7 (L) 08/09/2018   HCT 24.7 (L) 08/09/2018   MCV 91.8 08/09/2018   PLT 163 08/09/2018   NEUTROABS 0.9 (L) 08/09/2018    Dg Chest 2 View  Result Date: 08/05/2018 CLINICAL DATA:  Fever of unknown origin, multiple sclerosis EXAM: CHEST - 2 VIEW COMPARISON:  07/27/2018 FINDINGS: Borderline enlargement of cardiac silhouette. Mediastinal contours and pulmonary vascularity normal. Bibasilar opacities question atelectasis versus infiltrate. Lungs otherwise clear. No pleural effusion or pneumothorax. Bones unremarkable. IMPRESSION: Bibasilar opacities question atelectasis versus infiltrate. Electronically Signed   By: Lavonia Dana M.D.   On: 08/05/2018 08:27   Dg Chest 2 View  Result Date: 07/27/2018 CLINICAL DATA:  Fever and malaise EXAM: CHEST - 2 VIEW COMPARISON:  July 16, 2018 FINDINGS: Lungs are clear. The heart size and pulmonary vascularity are normal. No adenopathy. No bone lesions. IMPRESSION: No edema or consolidation. Electronically Signed   By: Lowella Grip III M.D.   On: 07/27/2018 15:00   Ct Chest  W Contrast  Result Date: 08/02/2018 CLINICAL DATA:  Fever of unknown origin. EXAM: CT CHEST, ABDOMEN, AND PELVIS WITH CONTRAST TECHNIQUE: Multidetector CT imaging of the chest, abdomen and pelvis was performed following the standard protocol during bolus administration of intravenous contrast. CONTRAST:  130m OMNIPAQUE IOHEXOL 300 MG/ML SOLN, 356mOMNIPAQUE IOHEXOL 300 MG/ML SOLN COMPARISON:  None. FINDINGS: CT CHEST FINDINGS  Cardiovascular: There is no large centrally located pulmonary embolus. Detection of smaller segmental and subsegmental pulmonary emboli is limited by contrast bolus timing. The main pulmonary artery is within normal limits for size. There is no CT evidence of acute right heart strain. The visualized aorta is normal. Heart size is normal. There is a trace pericardial effusion. Mediastinum/Nodes: --there are mildly prominent mediastinal and hilar lymph nodes bilaterally. --there is mild bilateral axillary adenopathy. --No supraclavicular lymphadenopathy. --Normal thyroid gland. --The esophagus is unremarkable Lungs/Pleura: There are trace bilateral pleural effusions with adjacent atelectasis. There is scattered ground-glass airspace opacities bilaterally, greatest within the right upper lobe. There is no pneumothorax. The trachea is unremarkable. Musculoskeletal: No chest wall abnormality. No acute or significant osseous findings. 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 CT ABDOMEN PELVIS FINDINGS Hepatobiliary: The liver is borderline enlarged. Status post cholecystectomy.There is no biliary ductal dilation. Pancreas: There is some mild peripancreatic fat stranding. Spleen: There is a small wedge-shaped defect in the lower pole the spleen measuring approximately 1.8 cm (axial series 2 image 47). Adrenals/Urinary Tract: --Adrenal glands: No adrenal hemorrhage. --Right kidney/ureter: No hydronephrosis or perinephric hematoma. --Left kidney/ureter: No hydronephrosis or perinephric hematoma. --Urinary bladder: Unremarkable. Stomach/Bowel: --Stomach/Duodenum: No hiatal hernia or other gastric abnormality. Normal duodenal course and caliber. --Small bowel: No dilatation or inflammation. --Colon: No focal abnormality. --Appendix: Normal. Vascular/Lymphatic: Normal course and caliber of the major abdominal vessels. There is a retroaortic left renal vein, a normal variant. --there are prominent  retroperitoneal lymph nodes. --there are prominent mesenteric lymph nodes. There is haziness throughout the mesentery. There are enlarged right lower quadrant lymph nodes measuring approximately 1.1 cm in diameter. --there are mildly enlarged pelvic and inguinal lymph nodes. Reproductive: There is a small amount of free fluid in the pelvis which may be physiologic. There is no discrete ovarian mass. Other: There is a small volume of pelvic free fluid which is likely physiologic. No free air. There is some mild fat stranding involving the anterior abdominal wall bilaterally. Musculoskeletal. No acute displaced fractures. IMPRESSION: 1. Scattered ground-glass airspace opacities, greatest within the right upper lobe, concerning for multifocal pneumonia. 2. Small bilateral pleural effusions with adjacent atelectasis. 3. Mild diffuse adenopathy throughout the chest, abdomen, and pelvis of unknown clinical significance. 4. Peripancreatic free fluid with edema within the root of the mesentery. Findings are nonspecific and can be seen in patients with pancreatitis, versus less likely sclerosing mesenteritis or inflammatory bowel disease. There are some prominent lymph nodes right lower quadrant which can be seen in patients with mesenteric adenitis. 5. Normal appendix in the right lower quadrant. 6. Small volume free fluid in the pelvis favored to be physiologic. 7. Wedge-shaped hypoattenuating defect in the spleen of unknown clinical significance. Differential considerations include a splenic infarct versus less likely a splenic laceration, especially in the absence of a history of trauma. There is no perisplenic hematoma. Electronically Signed   By: ChConstance Holster.D.   On: 08/02/2018 00:18   Mr Brain Wo Contrast  Result Date: 07/28/2018 CLINICAL DATA:  Encephalopathy with hallucinations EXAM: MRI HEAD WITHOUT CONTRAST TECHNIQUE:  Multiplanar, multiecho pulse sequences of the brain and surrounding structures were  obtained without intravenous contrast. COMPARISON:  Brain MRI 03/24/2018 FINDINGS: BRAIN: There is no acute infarct, acute hemorrhage or extra-axial collection. The midline structures are normal. The white matter signal is normal for the patient's age. The cerebral and cerebellar volume are age-appropriate. No hydrocephalus. Susceptibility-sensitive sequences show no chronic microhemorrhage or superficial siderosis. No midline shift or other mass effect. VASCULAR: The major intracranial arterial and venous sinus flow voids are normal. SKULL AND UPPER CERVICAL SPINE: Calvarial bone marrow signal is normal. There is no skull base mass. Visualized upper cervical spine and soft tissues are normal. SINUSES/ORBITS: No fluid levels or advanced mucosal thickening. No mastoid or middle ear effusion. The orbits are normal. IMPRESSION: Normal brain. Electronically Signed   By: Ulyses Jarred M.D.   On: 07/28/2018 00:13   Mr Lumbar Spine Wo Contrast  Result Date: 08/02/2018 CLINICAL DATA:  Fever. Lower extremity weakness. History of multiple sclerosis. EXAM: MRI LUMBAR SPINE WITHOUT CONTRAST TECHNIQUE: Multiplanar, multisequence MR imaging of the lumbar spine was performed. No intravenous contrast was administered. COMPARISON:  CT abdomen pelvis from yesterday. FINDINGS: Despite efforts by the technologist and patient, motion artifact is present on today's exam and could not be eliminated. This reduces exam sensitivity and specificity. Segmentation:  Standard. Alignment:  Physiologic. Vertebrae:  No fracture, evidence of discitis, or bone lesion. Conus medullaris and cauda equina: Conus extends to the L1-L2 level. Conus and cauda equina appear normal. Paraspinal and other soft tissues: Symmetric bilateral paraspinous muscle edema. Otherwise negative. Disc levels: No significant disc bulge or herniation. No spinal canal or neuroforaminal stenosis. IMPRESSION: 1.  No acute abnormality or significant degenerative changes.  Electronically Signed   By: Titus Dubin M.D.   On: 08/02/2018 09:14   Ct Abdomen Pelvis W Contrast  Result Date: 08/02/2018 CLINICAL DATA:  Fever of unknown origin. EXAM: CT CHEST, ABDOMEN, AND PELVIS WITH CONTRAST TECHNIQUE: Multidetector CT imaging of the chest, abdomen and pelvis was performed following the standard protocol during bolus administration of intravenous contrast. CONTRAST:  167m OMNIPAQUE IOHEXOL 300 MG/ML SOLN, 338mOMNIPAQUE IOHEXOL 300 MG/ML SOLN COMPARISON:  None. FINDINGS: CT CHEST FINDINGS Cardiovascular: There is no large centrally located pulmonary embolus. Detection of smaller segmental and subsegmental pulmonary emboli is limited by contrast bolus timing. The main pulmonary artery is within normal limits for size. There is no CT evidence of acute right heart strain. The visualized aorta is normal. Heart size is normal. There is a trace pericardial effusion. Mediastinum/Nodes: --there are mildly prominent mediastinal and hilar lymph nodes bilaterally. --there is mild bilateral axillary adenopathy. --No supraclavicular lymphadenopathy. --Normal thyroid gland. --The esophagus is unremarkable Lungs/Pleura: There are trace bilateral pleural effusions with adjacent atelectasis. There is scattered ground-glass airspace opacities bilaterally, greatest within the right upper lobe. There is no pneumothorax. The trachea is unremarkable. Musculoskeletal: No chest wall abnormality. No acute or significant osseous findings. 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 CT ABDOMEN PELVIS FINDINGS Hepatobiliary: The liver is borderline enlarged. Status post cholecystectomy.There is no biliary ductal dilation. Pancreas: There is some mild peripancreatic fat stranding. Spleen: There is a small wedge-shaped defect in the lower pole the spleen measuring approximately 1.8 cm (axial series 2 image 47). Adrenals/Urinary Tract: --Adrenal glands: No adrenal hemorrhage. --Right  kidney/ureter: No hydronephrosis or perinephric hematoma. --Left kidney/ureter: No hydronephrosis or perinephric hematoma. --Urinary bladder: Unremarkable. Stomach/Bowel: --Stomach/Duodenum: No hiatal hernia or other gastric abnormality. Normal duodenal course and  caliber. --Small bowel: No dilatation or inflammation. --Colon: No focal abnormality. --Appendix: Normal. Vascular/Lymphatic: Normal course and caliber of the major abdominal vessels. There is a retroaortic left renal vein, a normal variant. --there are prominent retroperitoneal lymph nodes. --there are prominent mesenteric lymph nodes. There is haziness throughout the mesentery. There are enlarged right lower quadrant lymph nodes measuring approximately 1.1 cm in diameter. --there are mildly enlarged pelvic and inguinal lymph nodes. Reproductive: There is a small amount of free fluid in the pelvis which may be physiologic. There is no discrete ovarian mass. Other: There is a small volume of pelvic free fluid which is likely physiologic. No free air. There is some mild fat stranding involving the anterior abdominal wall bilaterally. Musculoskeletal. No acute displaced fractures. IMPRESSION: 1. Scattered ground-glass airspace opacities, greatest within the right upper lobe, concerning for multifocal pneumonia. 2. Small bilateral pleural effusions with adjacent atelectasis. 3. Mild diffuse adenopathy throughout the chest, abdomen, and pelvis of unknown clinical significance. 4. Peripancreatic free fluid with edema within the root of the mesentery. Findings are nonspecific and can be seen in patients with pancreatitis, versus less likely sclerosing mesenteritis or inflammatory bowel disease. There are some prominent lymph nodes right lower quadrant which can be seen in patients with mesenteric adenitis. 5. Normal appendix in the right lower quadrant. 6. Small volume free fluid in the pelvis favored to be physiologic. 7. Wedge-shaped hypoattenuating defect in  the spleen of unknown clinical significance. Differential considerations include a splenic infarct versus less likely a splenic laceration, especially in the absence of a history of trauma. There is no perisplenic hematoma. Electronically Signed   By: Constance Holster M.D.   On: 08/02/2018 00:18   Ct Maxillofacial W Contrast  Result Date: 08/08/2018 CLINICAL DATA:  Fevers.  Right mandibular molar tooth pain. EXAM: CT MAXILLOFACIAL WITH CONTRAST TECHNIQUE: Multidetector CT imaging of the maxillofacial structures was performed with intravenous contrast. Multiplanar CT image reconstructions were also generated. CONTRAST:  30m OMNIPAQUE IOHEXOL 300 MG/ML  SOLN COMPARISON:  MRI 07/27/2018 FINDINGS: Osseous: No maxillary, mid face or upper face abnormality is seen. There is no evidence of active dental or periodontal disease. With respect to the right mandible, teeth 25 through 31 appear normal. Tooth 32 is absent and presumably has been extracted in the distant past. There is a benign appearing focus of hazy sclerosis measuring about 1.5 cm at the angle of the mandible on the right that could represent sequela of the previous extraction. If the patient has not had extraction of tooth 32, then this could be some dysplastic change related to aplasia of the tooth. I do not see any likely significant finding in this region. Left mandibular molar remains in place, not fully erupted. Orbits: Normal Sinuses: Clear Soft tissues: Normal. No soft tissue inflammatory changes are evident. Limited intracranial: Normal IMPRESSION: No evidence of dental or periodontal decay or inflammatory disease. Absent right mandibular molar tooth. Presumably there has been distant extraction. Hazy sclerosis within the mandible at the angle of the mandible could be chronic healed sequela of the molar tooth extraction. If there is not a history of molar tooth extraction, then there could be aplasia/hypoplasia of tooth 32. Electronically Signed    By: MNelson ChimesM.D.   On: 08/08/2018 11:49   Dg Chest Port 1 View  Result Date: 07/16/2018 CLINICAL DATA:  Fatigue EXAM: PORTABLE CHEST 1 VIEW COMPARISON:  None. FINDINGS: The heart size and mediastinal contours are within normal limits. Both lungs are clear. The  visualized skeletal structures are unremarkable. IMPRESSION: No active disease. Electronically Signed   By: Inez Catalina M.D.   On: 07/16/2018 15:18   Vas [place] Lower Extremity Venous (dvt)  Result Date: 08/06/2018  Lower Venous Study Indications: Immobility, fever.  Limitations: Patient positioning. Comparison Study: No prior Performing Technologist: Abram Sander RVS  Examination Guidelines: A complete evaluation includes B-mode imaging, spectral Doppler, color Doppler, and power Doppler as needed of all accessible portions of each vessel. Bilateral testing is considered an integral part of a complete examination. Limited examinations for reoccurring indications may be performed as noted.  +---------+---------------+---------+-----------+----------+--------------+ RIGHT    CompressibilityPhasicitySpontaneityPropertiesSummary        +---------+---------------+---------+-----------+----------+--------------+ CFV      Full           Yes      Yes                                 +---------+---------------+---------+-----------+----------+--------------+ SFJ      Full                                                        +---------+---------------+---------+-----------+----------+--------------+ FV Prox  Full                                                        +---------+---------------+---------+-----------+----------+--------------+ FV Mid   Full                                                        +---------+---------------+---------+-----------+----------+--------------+ FV DistalFull                                                         +---------+---------------+---------+-----------+----------+--------------+ PFV      Full                                                        +---------+---------------+---------+-----------+----------+--------------+ POP      Full           Yes      Yes                                 +---------+---------------+---------+-----------+----------+--------------+ PTV      Full                                                        +---------+---------------+---------+-----------+----------+--------------+ PERO  Not visualized +---------+---------------+---------+-----------+----------+--------------+   +---------+---------------+---------+-----------+----------+--------------+ LEFT     CompressibilityPhasicitySpontaneityPropertiesSummary        +---------+---------------+---------+-----------+----------+--------------+ CFV      Full           Yes      Yes                                 +---------+---------------+---------+-----------+----------+--------------+ SFJ      Full                                                        +---------+---------------+---------+-----------+----------+--------------+ FV Prox  Full                                                        +---------+---------------+---------+-----------+----------+--------------+ FV Mid   Full                                                        +---------+---------------+---------+-----------+----------+--------------+ FV DistalFull                                                        +---------+---------------+---------+-----------+----------+--------------+ PFV      Full                                                        +---------+---------------+---------+-----------+----------+--------------+ POP      Full           Yes      Yes                                  +---------+---------------+---------+-----------+----------+--------------+ PTV      Full                                                        +---------+---------------+---------+-----------+----------+--------------+ PERO                                                  Not visualized +---------+---------------+---------+-----------+----------+--------------+     Summary: Right: There is no evidence of deep vein thrombosis in the lower extremity. No cystic structure found in the popliteal fossa. Left: There is no evidence of deep vein thrombosis in the lower extremity. No cystic structure found in  the popliteal fossa.  *See table(s) above for measurements and observations. Electronically signed by Servando Snare MD on 08/06/2018 at 11:00:59 AM.    Final     ASSESSMENT AND PLAN: 1. Pancytopenia -characterized by leukopenia, normocytic anemia, and mild, intermittent thrombocytopenia. 2.  Multiple sclerosis  Ms. Aeschliman appears unchanged. CBC is stable. Labs do not show evidence of iron deficiency, Vitamin B12 deficiency, or folate deficiency. LDH is elevated, Reticulocytes not elevated, Haptoglobin and SPEP pending. Her ANA was Positive in 03/2018. Repeat is pending. Suspect that pancytopenia is a result of her acute systemic infection, etiology of which is unclear.   1. Continue to monitor daily CBC with diff. Transfuse PRBC if hemoglobin is <7.0 or active bleeding.  2. If counts do not improve soon or worsen, may need to proceed with a bone marrow biopsy. 3. Recommend Rheumatology consult for elevated ANA. 4.  Repeat chest CT to follow-up on pulmonary infiltrates 5. May need to consider transfer to [facility] care work-up here remains nondiagnostic    LOS: 14 days   Mikey Bussing, [place], AGPCNP-BC, AOCNP 08/10/18 Ms. Bettes was interviewed and examined.  She appears unchanged.  She has persistent intermittent fever.  I continue to suspect the hematologic findings are related to systemic  inflammation and bone marrow suppression.  We will follow-up on the ANA and serum protein electrophoresis.  I would also consider a repeat chest CT to follow-up on the pulmonary infiltrates noted on the CT 08/02/2018.  I have a low suspicion for a malignancy, but this remains in the differential diagnosis.  Hematology will continue following her daily.

## 2018-08-10 NOTE — Progress Notes (Signed)
Nutrition Follow-up  DOCUMENTATION CODES:   (unable to assess for malnutrition at this time.)  INTERVENTION:  - continue Boost Breeze and Ensure Enlive once/day. - continue prostat BID. - continue to encourage PO intakes. - recommend ongoing discussions about small bore NGT.  - weigh patient   NUTRITION DIAGNOSIS:   Inadequate oral intake related to acute illness, poor appetite, other (see comment)(tooth pain) as evidenced by per patient/family report, meal completion < 25%. -revised, ongoing  GOAL:   Patient will meet greater than or equal to 90% of their needs -unmet  MONITOR:   PO intake, Supplement acceptance, Weight trends, Labs  ASSESSMENT:   33 year-old female with medical history significant of multiple sclerosis and depression. She presented to the ED for the evaluation of AMS, fever, and generalized weakness. In the ED, all information was taken from the patient and her husband on phone. Her husband reported that patient has been increasingly weak since last week and that she has a very poor appetite and has not eaten anything. She has been confused and acting differently. Patient was diagnosed with multiple sclerosis about 3 months ago; she follows with neurology. She is usually able to walk with support. Husband states that she has steadily going downhill since last few weeks. Her husband tries to feed her but she often throws up with intakes.   Patient has not been weighed since 6/27. Diet was downgraded from Soft to Dysphagia 2, thin liquids on 7/5 at 9:55 AM. Per review of RN flow sheet, patient consumed 0% of lunch and dinner on 7/4; 20% of breakfast and 0% of lunch on 7/5; and 0% of breakfast and dinner on 7/6. Orders in place for Ensure and Boost Breeze once/day and patient has accepted 3 of 5 of each of these supplements. Prostat was ordered BID on 7/5 and patient accepted 2 of the 4 packets offered.   Patient reports ongoing tooth pain. She states that an x-ray  was done but showed nothing. She reports that pain only occurs with eating, drinking, and taking medications and is a sharp, shooting-type pain. It happens with everything she takes in and does not feel better or worse with warm items rather than cold items.   Talked with patient about the importance of adequate nutrition. Explained a small bore NGT to her and informed her that this could be an option in order to bypass her mouth in the hope of dental pain subsiding. Patient states that she will try her best to eat more and that she does not want a NGT placed.     Medications reviewed; 2 mg folvite/day, 400 mg mag-ox BID, daily multivitamin with minerals, 5 ml mycostatin QID, 500 mg KPhos neutral TID. Labs reviewed; Cl: 117 mmol/l, creatinine: 1.08 mg/dl, Ca: 7.2 mg/dl.      NUTRITION - FOCUSED PHYSICAL EXAM:  completed; no muscle and no fat wasting noted.   Diet Order:   Diet Order            DIET DYS 2 Room service appropriate? Yes; Fluid consistency: Thin  Diet effective now              EDUCATION NEEDS:   Not appropriate for education at this time  Skin:  Skin Assessment: Skin Integrity Issues: Skin Integrity Issues:: Stage II Stage II: sacrum (new 6/29)  Last BM:  7/5  Height:   Ht Readings from Last 1 Encounters:  07/27/18 5\' 1"  (1.549 m)    Weight:   Wt Readings  from Last 1 Encounters:  07/31/18 89 kg    Ideal Body Weight:  47.7 kg  BMI:  Body mass index is 37.07 kg/m.  Estimated Nutritional Needs:   Kcal:  2250-2415 kcal  Protein:  100-110 grams  Fluid:  >/= 2.2 L/day      Jarome Matin, MS, RD, LDN, [facility] Clinical Dietitian Pager # [phone] After hours/weekend pager # [phone]

## 2018-08-11 ENCOUNTER — Encounter (HOSPITAL_COMMUNITY): Payer: Self-pay | Admitting: Radiology

## 2018-08-11 ENCOUNTER — Inpatient Hospital Stay (HOSPITAL_COMMUNITY): Payer: 59

## 2018-08-11 DIAGNOSIS — R509 Fever, unspecified: Secondary | ICD-10-CM

## 2018-08-11 LAB — MULTIPLE MYELOMA PANEL, SERUM
Albumin SerPl Elph-Mcnc: 2.2 g/dL — ABNORMAL LOW (ref 2.9–4.4)
Albumin/Glob SerPl: 0.9 (ref 0.7–1.7)
Alpha 1: 0.2 g/dL (ref 0.0–0.4)
Alpha2 Glob SerPl Elph-Mcnc: 0.8 g/dL (ref 0.4–1.0)
B-Globulin SerPl Elph-Mcnc: 0.6 g/dL — ABNORMAL LOW (ref 0.7–1.3)
Gamma Glob SerPl Elph-Mcnc: 1 g/dL (ref 0.4–1.8)
Globulin, Total: 2.6 g/dL (ref 2.2–3.9)
IgA: 322 mg/dL (ref 87–352)
IgG (Immunoglobin G), Serum: 1208 mg/dL (ref 586–1602)
IgM (Immunoglobulin M), Srm: 114 mg/dL (ref 26–217)
Total Protein ELP: 4.8 g/dL — ABNORMAL LOW (ref 6.0–8.5)

## 2018-08-11 LAB — DIFFERENTIAL
Basophils Absolute: 0 10*3/uL (ref 0.0–0.1)
Basophils Relative: 0 %
Eosinophils Absolute: 0 10*3/uL (ref 0.0–0.5)
Eosinophils Relative: 0 %
Lymphocytes Relative: 31 %
Lymphs Abs: 0.4 10*3/uL — ABNORMAL LOW (ref 0.7–4.0)
Monocytes Absolute: 0.1 10*3/uL (ref 0.1–1.0)
Monocytes Relative: 9 %
Neutro Abs: 0.7 10*3/uL — ABNORMAL LOW (ref 1.7–7.7)
Neutrophils Relative %: 56 %

## 2018-08-11 LAB — CBC
HCT: 26.4 % — ABNORMAL LOW (ref 36.0–46.0)
Hemoglobin: 8.1 g/dL — ABNORMAL LOW (ref 12.0–15.0)
MCH: 28.6 pg (ref 26.0–34.0)
MCHC: 30.7 g/dL (ref 30.0–36.0)
MCV: 93.3 fL (ref 80.0–100.0)
Platelets: 178 10*3/uL (ref 150–400)
RBC: 2.83 MIL/uL — ABNORMAL LOW (ref 3.87–5.11)
RDW: 20 % — ABNORMAL HIGH (ref 11.5–15.5)
WBC: 1.3 10*3/uL — CL (ref 4.0–10.5)
nRBC: 0 % (ref 0.0–0.2)

## 2018-08-11 LAB — BASIC METABOLIC PANEL
Anion gap: 3 — ABNORMAL LOW (ref 5–15)
BUN: 11 mg/dL (ref 6–20)
CO2: 18 mmol/L — ABNORMAL LOW (ref 22–32)
Calcium: 7.2 mg/dL — ABNORMAL LOW (ref 8.9–10.3)
Chloride: 120 mmol/L — ABNORMAL HIGH (ref 98–111)
Creatinine, Ser: 1.09 mg/dL — ABNORMAL HIGH (ref 0.44–1.00)
GFR calc Af Amer: >60 mL/min (ref >60)
GFR calc non Af Amer: >60 mL/min (ref >60)
Glucose, Bld: 88 mg/dL (ref 70–99)
Potassium: 4.3 mmol/L (ref 3.5–5.1)
Sodium: 141 mmol/L (ref 135–145)

## 2018-08-11 LAB — ANTINUCLEAR ANTIBODIES, IFA: ANA Ab, IFA: POSITIVE — AB

## 2018-08-11 LAB — HAPTOGLOBIN: Haptoglobin: 194 mg/dL (ref 33–278)

## 2018-08-11 LAB — SAVE SMEAR(SSMR), FOR PROVIDER SLIDE REVIEW

## 2018-08-11 LAB — FANA STAINING PATTERNS: Homogeneous Pattern: 1:1280 {titer} — ABNORMAL HIGH

## 2018-08-11 MED ORDER — MIRTAZAPINE 15 MG PO TABS
15.0000 mg | ORAL_TABLET | Freq: Every day | ORAL | Status: DC
  Administered 2018-08-11 – 2018-08-12 (×2): 15 mg via ORAL
  Filled 2018-08-11 (×2): qty 1

## 2018-08-11 MED ORDER — DULOXETINE HCL 60 MG PO CPEP
60.0000 mg | ORAL_CAPSULE | Freq: Every day | ORAL | Status: DC
  Administered 2018-08-11 – 2018-08-13 (×3): 60 mg via ORAL
  Filled 2018-08-11 (×3): qty 1

## 2018-08-11 MED ORDER — SODIUM CHLORIDE (PF) 0.9 % IJ SOLN
INTRAMUSCULAR | Status: AC
  Filled 2018-08-11: qty 50

## 2018-08-11 MED ORDER — DIPHENHYDRAMINE HCL 50 MG/ML IJ SOLN
25.0000 mg | Freq: Four times a day (QID) | INTRAMUSCULAR | Status: DC | PRN
  Administered 2018-08-11: 25 mg via INTRAVENOUS
  Filled 2018-08-11: qty 1

## 2018-08-11 MED ORDER — FAMOTIDINE IN NACL 20-0.9 MG/50ML-% IV SOLN
20.0000 mg | Freq: Two times a day (BID) | INTRAVENOUS | Status: DC
  Administered 2018-08-11 – 2018-08-15 (×8): 20 mg via INTRAVENOUS
  Filled 2018-08-11 (×9): qty 50

## 2018-08-11 MED ORDER — IOHEXOL 300 MG/ML  SOLN
75.0000 mL | Freq: Once | INTRAMUSCULAR | Status: AC | PRN
  Administered 2018-08-11: 75 mL via INTRAVENOUS

## 2018-08-11 MED ORDER — ENOXAPARIN SODIUM 40 MG/0.4ML ~~LOC~~ SOLN
40.0000 mg | SUBCUTANEOUS | Status: DC
  Administered 2018-08-11 – 2018-08-15 (×5): 40 mg via SUBCUTANEOUS
  Filled 2018-08-11 (×5): qty 0.4

## 2018-08-11 NOTE — Progress Notes (Addendum)
HEMATOLOGY-ONCOLOGY PROGRESS NOTE  SUBJECTIVE: T-max nine 99.7 in the past 24 hours.  Denies chills.  Reports that she is feeling better overall.  Has been able to get out of bed.  Diarrhea has resolved.  She has no other complaints this morning.  REVIEW OF SYSTEMS:   Constitutional: Fever curve improving. No chills.  Respiratory: Denies cough, dyspnea or wheezes Cardiovascular: Denies palpitation, chest discomfort Gastrointestinal:  Denies nausea, vomiting.  No diarrhea. Skin: Denies abnormal skin rashes Lymphatics: Denies new lymphadenopathy or easy bruising Neurological:Denies numbness, tingling or new weaknesses Behavioral/Psych: Mood is stable, no new changes  Extremities: No lower extremity edema All other systems were reviewed with the patient and are negative.  I have reviewed the past medical history, past surgical history, social history and family history with the patient and they are unchanged from previous note.   PHYSICAL EXAMINATION:  Vitals:   08/11/18 0544 08/11/18 1010  BP: 107/73 116/73  Pulse: (!) 119 (!) 133  Resp: 16   Temp: 98.8 F (37.1 C)   SpO2: 99%    Filed Weights   07/27/18 1745 07/31/18 0300  Weight: 183 lb 10.3 oz (83.3 kg) 196 lb 3.4 oz (89 kg)    Intake/Output from previous day: No intake/output data recorded.  GENERAL:alert, no distress  SKIN: no rashes or significant lesions OROPHARYNX: Swollen lips. No thrush.  LUNGS: clear to auscultation and percussion with normal breathing effort HEART: Tachycardic. Regular rhythm.  ABDOMEN:abdomen soft, non-tender and normal bowel sounds NEURO: alert & oriented x 3 with fluent speech, no focal motor/sensory deficits  LABORATORY DATA:  I have reviewed the data as listed CMP Latest Ref Rng & Units 08/11/2018 08/10/2018 08/09/2018  Glucose 70 - 99 mg/dL 88 88 81  BUN 6 - 20 mg/dL '11 12 13  '$ Creatinine 0.44 - 1.00 mg/dL 1.09(H) 1.06(H) 1.08(H)  Sodium 135 - 145 mmol/L 141 141 140  Potassium 3.5 - 5.1  mmol/L 4.3 3.5 3.5  Chloride 98 - 111 mmol/L 120(H) 118(H) 117(H)  CO2 22 - 32 mmol/L 18(L) 15(L) 16(L)  Calcium 8.9 - 10.3 mg/dL 7.2(L) 7.2(L) 7.2(L)  Total Protein 6.5 - 8.1 g/dL - - -  Total Bilirubin 0.3 - 1.2 mg/dL - - -  Alkaline Phos 38 - 126 U/L - - -  AST 15 - 41 U/L - - -  ALT 0 - 44 U/L - - -    Lab Results  Component Value Date   WBC 1.3 (LL) 08/11/2018   HGB 8.1 (L) 08/11/2018   HCT 26.4 (L) 08/11/2018   MCV 93.3 08/11/2018   PLT 178 08/11/2018   NEUTROABS 0.7 (L) 08/11/2018  Blood smear: The platelets appear normal in number, rare giant platelets.  The white cells are decreased, the majority are neutrophils with band forms.  No blasts.  Few ovalocytes and teardrops.  The polychromasia is mildly increased.  No nucleated red cells.  No blasts.  Dg Chest 2 View  Result Date: 08/05/2018 CLINICAL DATA:  Fever of unknown origin, multiple sclerosis EXAM: CHEST - 2 VIEW COMPARISON:  07/27/2018 FINDINGS: Borderline enlargement of cardiac silhouette. Mediastinal contours and pulmonary vascularity normal. Bibasilar opacities question atelectasis versus infiltrate. Lungs otherwise clear. No pleural effusion or pneumothorax. Bones unremarkable. IMPRESSION: Bibasilar opacities question atelectasis versus infiltrate. Electronically Signed   By: Lavonia Dana M.D.   On: 08/05/2018 08:27   Dg Chest 2 View  Result Date: 07/27/2018 CLINICAL DATA:  Fever and malaise EXAM: CHEST - 2 VIEW COMPARISON:  July 16, 2018 FINDINGS: Lungs are clear. The heart size and pulmonary vascularity are normal. No adenopathy. No bone lesions. IMPRESSION: No edema or consolidation. Electronically Signed   By: Lowella Grip III M.D.   On: 07/27/2018 15:00   Ct Chest W Contrast  Result Date: 08/11/2018 CLINICAL DATA:  Fevers of unknown origin. EXAM: CT CHEST WITH CONTRAST TECHNIQUE: Multidetector CT imaging of the chest was performed during intravenous contrast administration. CONTRAST:  75 mL OMNIPAQUE IOHEXOL  300 MG/ML  SOLN COMPARISON:  CT chest, abdomen and pelvis 08/01/2018. PA and lateral chest 07/27/2010 and 08/05/2018 FINDINGS: Cardiovascular: Heart size is mildly enlarged. No pericardial effusion. No atherosclerotic vascular disease is seen. Mediastinum/Nodes: Small bilateral axillary lymph nodes have clearly visible fatty hila consistent with benignity. No mediastinal or hilar lymphadenopathy by CT size criteria. Lungs/Pleura: Small bilateral pleural effusions are not notably changed. Dependent bilateral lower lobe airspace disease is improved since the prior examination. Residual opacities have an appearance most compatible with atelectasis. Scattered ground-glass opacity seen on the comparison CT have resolved. 0.3 cm right upper lobe nodule on image 26 of series 5 is unchanged. Upper Abdomen: Status post cholecystectomy.  No acute finding. Musculoskeletal: T11 hemivertebra noted. No acute or focal abnormality. IMPRESSION: Scattered ground-glass opacities seen on the comparison CT have resolved. Dependent lower lobe airspace disease bilaterally is improved. Residual opacities have an appearance most compatible with atelectasis rather than pneumonia. No change in small bilateral pleural effusions. No change in a 0.3 cm right upper lobe pulmonary nodule. No follow-up needed if patient is low-risk. Non-contrast chest CT can be considered in 12 months if patient is high-risk. This recommendation follows the consensus statement: Guidelines for Management of Incidental Pulmonary Nodules Detected on CT Images: From the Fleischner Society 2017; Radiology 2017; 284:228-243. Cardiomegaly. Electronically Signed   By: Inge Rise M.D.   On: 08/11/2018 11:01   Ct Chest W Contrast  Result Date: 08/02/2018 CLINICAL DATA:  Fever of unknown origin. EXAM: CT CHEST, ABDOMEN, AND PELVIS WITH CONTRAST TECHNIQUE: Multidetector CT imaging of the chest, abdomen and pelvis was performed following the standard protocol during  bolus administration of intravenous contrast. CONTRAST:  158m OMNIPAQUE IOHEXOL 300 MG/ML SOLN, 342mOMNIPAQUE IOHEXOL 300 MG/ML SOLN COMPARISON:  None. FINDINGS: CT CHEST FINDINGS Cardiovascular: There is no large centrally located pulmonary embolus. Detection of smaller segmental and subsegmental pulmonary emboli is limited by contrast bolus timing. The main pulmonary artery is within normal limits for size. There is no CT evidence of acute right heart strain. The visualized aorta is normal. Heart size is normal. There is a trace pericardial effusion. Mediastinum/Nodes: --there are mildly prominent mediastinal and hilar lymph nodes bilaterally. --there is mild bilateral axillary adenopathy. --No supraclavicular lymphadenopathy. --Normal thyroid gland. --The esophagus is unremarkable Lungs/Pleura: There are trace bilateral pleural effusions with adjacent atelectasis. There is scattered ground-glass airspace opacities bilaterally, greatest within the right upper lobe. There is no pneumothorax. The trachea is unremarkable. Musculoskeletal: No chest wall abnormality. No acute or significant osseous findings. 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 CT ABDOMEN PELVIS FINDINGS Hepatobiliary: The liver is borderline enlarged. Status post cholecystectomy.There is no biliary ductal dilation. Pancreas: There is some mild peripancreatic fat stranding. Spleen: There is a small wedge-shaped defect in the lower pole the spleen measuring approximately 1.8 cm (axial series 2 image 47). Adrenals/Urinary Tract: --Adrenal glands: No adrenal hemorrhage. --Right kidney/ureter: No hydronephrosis or perinephric hematoma. --Left kidney/ureter: No hydronephrosis or perinephric hematoma. --Urinary bladder: Unremarkable. Stomach/Bowel: --Stomach/Duodenum:  No hiatal hernia or other gastric abnormality. Normal duodenal course and caliber. --Small bowel: No dilatation or inflammation. --Colon: No focal  abnormality. --Appendix: Normal. Vascular/Lymphatic: Normal course and caliber of the major abdominal vessels. There is a retroaortic left renal vein, a normal variant. --there are prominent retroperitoneal lymph nodes. --there are prominent mesenteric lymph nodes. There is haziness throughout the mesentery. There are enlarged right lower quadrant lymph nodes measuring approximately 1.1 cm in diameter. --there are mildly enlarged pelvic and inguinal lymph nodes. Reproductive: There is a small amount of free fluid in the pelvis which may be physiologic. There is no discrete ovarian mass. Other: There is a small volume of pelvic free fluid which is likely physiologic. No free air. There is some mild fat stranding involving the anterior abdominal wall bilaterally. Musculoskeletal. No acute displaced fractures. IMPRESSION: 1. Scattered ground-glass airspace opacities, greatest within the right upper lobe, concerning for multifocal pneumonia. 2. Small bilateral pleural effusions with adjacent atelectasis. 3. Mild diffuse adenopathy throughout the chest, abdomen, and pelvis of unknown clinical significance. 4. Peripancreatic free fluid with edema within the root of the mesentery. Findings are nonspecific and can be seen in patients with pancreatitis, versus less likely sclerosing mesenteritis or inflammatory bowel disease. There are some prominent lymph nodes right lower quadrant which can be seen in patients with mesenteric adenitis. 5. Normal appendix in the right lower quadrant. 6. Small volume free fluid in the pelvis favored to be physiologic. 7. Wedge-shaped hypoattenuating defect in the spleen of unknown clinical significance. Differential considerations include a splenic infarct versus less likely a splenic laceration, especially in the absence of a history of trauma. There is no perisplenic hematoma. Electronically Signed   By: Constance Holster M.D.   On: 08/02/2018 00:18   Mr Brain Wo Contrast  Result  Date: 07/28/2018 CLINICAL DATA:  Encephalopathy with hallucinations EXAM: MRI HEAD WITHOUT CONTRAST TECHNIQUE: Multiplanar, multiecho pulse sequences of the brain and surrounding structures were obtained without intravenous contrast. COMPARISON:  Brain MRI 03/24/2018 FINDINGS: BRAIN: There is no acute infarct, acute hemorrhage or extra-axial collection. The midline structures are normal. The white matter signal is normal for the patient's age. The cerebral and cerebellar volume are age-appropriate. No hydrocephalus. Susceptibility-sensitive sequences show no chronic microhemorrhage or superficial siderosis. No midline shift or other mass effect. VASCULAR: The major intracranial arterial and venous sinus flow voids are normal. SKULL AND UPPER CERVICAL SPINE: Calvarial bone marrow signal is normal. There is no skull base mass. Visualized upper cervical spine and soft tissues are normal. SINUSES/ORBITS: No fluid levels or advanced mucosal thickening. No mastoid or middle ear effusion. The orbits are normal. IMPRESSION: Normal brain. Electronically Signed   By: Ulyses Jarred M.D.   On: 07/28/2018 00:13   Mr Lumbar Spine Wo Contrast  Result Date: 08/02/2018 CLINICAL DATA:  Fever. Lower extremity weakness. History of multiple sclerosis. EXAM: MRI LUMBAR SPINE WITHOUT CONTRAST TECHNIQUE: Multiplanar, multisequence MR imaging of the lumbar spine was performed. No intravenous contrast was administered. COMPARISON:  CT abdomen pelvis from yesterday. FINDINGS: Despite efforts by the technologist and patient, motion artifact is present on today's exam and could not be eliminated. This reduces exam sensitivity and specificity. Segmentation:  Standard. Alignment:  Physiologic. Vertebrae:  No fracture, evidence of discitis, or bone lesion. Conus medullaris and cauda equina: Conus extends to the L1-L2 level. Conus and cauda equina appear normal. Paraspinal and other soft tissues: Symmetric bilateral paraspinous muscle edema.  Otherwise negative. Disc levels: No significant disc bulge or herniation.  No spinal canal or neuroforaminal stenosis. IMPRESSION: 1.  No acute abnormality or significant degenerative changes. Electronically Signed   By: Titus Dubin M.D.   On: 08/02/2018 09:14   Ct Abdomen Pelvis W Contrast  Result Date: 08/02/2018 CLINICAL DATA:  Fever of unknown origin. EXAM: CT CHEST, ABDOMEN, AND PELVIS WITH CONTRAST TECHNIQUE: Multidetector CT imaging of the chest, abdomen and pelvis was performed following the standard protocol during bolus administration of intravenous contrast. CONTRAST:  175m OMNIPAQUE IOHEXOL 300 MG/ML SOLN, 350mOMNIPAQUE IOHEXOL 300 MG/ML SOLN COMPARISON:  None. FINDINGS: CT CHEST FINDINGS Cardiovascular: There is no large centrally located pulmonary embolus. Detection of smaller segmental and subsegmental pulmonary emboli is limited by contrast bolus timing. The main pulmonary artery is within normal limits for size. There is no CT evidence of acute right heart strain. The visualized aorta is normal. Heart size is normal. There is a trace pericardial effusion. Mediastinum/Nodes: --there are mildly prominent mediastinal and hilar lymph nodes bilaterally. --there is mild bilateral axillary adenopathy. --No supraclavicular lymphadenopathy. --Normal thyroid gland. --The esophagus is unremarkable Lungs/Pleura: There are trace bilateral pleural effusions with adjacent atelectasis. There is scattered ground-glass airspace opacities bilaterally, greatest within the right upper lobe. There is no pneumothorax. The trachea is unremarkable. Musculoskeletal: No chest wall abnormality. No acute or significant osseous findings. 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 CT ABDOMEN PELVIS FINDINGS Hepatobiliary: The liver is borderline enlarged. Status post cholecystectomy.There is no biliary ductal dilation. Pancreas: There is some mild peripancreatic fat stranding. Spleen: There is  a small wedge-shaped defect in the lower pole the spleen measuring approximately 1.8 cm (axial series 2 image 47). Adrenals/Urinary Tract: --Adrenal glands: No adrenal hemorrhage. --Right kidney/ureter: No hydronephrosis or perinephric hematoma. --Left kidney/ureter: No hydronephrosis or perinephric hematoma. --Urinary bladder: Unremarkable. Stomach/Bowel: --Stomach/Duodenum: No hiatal hernia or other gastric abnormality. Normal duodenal course and caliber. --Small bowel: No dilatation or inflammation. --Colon: No focal abnormality. --Appendix: Normal. Vascular/Lymphatic: Normal course and caliber of the major abdominal vessels. There is a retroaortic left renal vein, a normal variant. --there are prominent retroperitoneal lymph nodes. --there are prominent mesenteric lymph nodes. There is haziness throughout the mesentery. There are enlarged right lower quadrant lymph nodes measuring approximately 1.1 cm in diameter. --there are mildly enlarged pelvic and inguinal lymph nodes. Reproductive: There is a small amount of free fluid in the pelvis which may be physiologic. There is no discrete ovarian mass. Other: There is a small volume of pelvic free fluid which is likely physiologic. No free air. There is some mild fat stranding involving the anterior abdominal wall bilaterally. Musculoskeletal. No acute displaced fractures. IMPRESSION: 1. Scattered ground-glass airspace opacities, greatest within the right upper lobe, concerning for multifocal pneumonia. 2. Small bilateral pleural effusions with adjacent atelectasis. 3. Mild diffuse adenopathy throughout the chest, abdomen, and pelvis of unknown clinical significance. 4. Peripancreatic free fluid with edema within the root of the mesentery. Findings are nonspecific and can be seen in patients with pancreatitis, versus less likely sclerosing mesenteritis or inflammatory bowel disease. There are some prominent lymph nodes right lower quadrant which can be seen in  patients with mesenteric adenitis. 5. Normal appendix in the right lower quadrant. 6. Small volume free fluid in the pelvis favored to be physiologic. 7. Wedge-shaped hypoattenuating defect in the spleen of unknown clinical significance. Differential considerations include a splenic infarct versus less likely a splenic laceration, especially in the absence of a history of trauma. There is no perisplenic hematoma. Electronically  Signed   By: Constance Holster M.D.   On: 08/02/2018 00:18   Ct Maxillofacial W Contrast  Result Date: 08/08/2018 CLINICAL DATA:  Fevers.  Right mandibular molar tooth pain. EXAM: CT MAXILLOFACIAL WITH CONTRAST TECHNIQUE: Multidetector CT imaging of the maxillofacial structures was performed with intravenous contrast. Multiplanar CT image reconstructions were also generated. CONTRAST:  22m OMNIPAQUE IOHEXOL 300 MG/ML  SOLN COMPARISON:  MRI 07/27/2018 FINDINGS: Osseous: No maxillary, mid face or upper face abnormality is seen. There is no evidence of active dental or periodontal disease. With respect to the right mandible, teeth 25 through 31 appear normal. Tooth 32 is absent and presumably has been extracted in the distant past. There is a benign appearing focus of hazy sclerosis measuring about 1.5 cm at the angle of the mandible on the right that could represent sequela of the previous extraction. If the patient has not had extraction of tooth 32, then this could be some dysplastic change related to aplasia of the tooth. I do not see any likely significant finding in this region. Left mandibular molar remains in place, not fully erupted. Orbits: Normal Sinuses: Clear Soft tissues: Normal. No soft tissue inflammatory changes are evident. Limited intracranial: Normal IMPRESSION: No evidence of dental or periodontal decay or inflammatory disease. Absent right mandibular molar tooth. Presumably there has been distant extraction. Hazy sclerosis within the mandible at the angle of the  mandible could be chronic healed sequela of the molar tooth extraction. If there is not a history of molar tooth extraction, then there could be aplasia/hypoplasia of tooth 32. Electronically Signed   By: MNelson ChimesM.D.   On: 08/08/2018 11:49   Dg Chest Port 1 View  Result Date: 07/16/2018 CLINICAL DATA:  Fatigue EXAM: PORTABLE CHEST 1 VIEW COMPARISON:  None. FINDINGS: The heart size and mediastinal contours are within normal limits. Both lungs are clear. The visualized skeletal structures are unremarkable. IMPRESSION: No active disease. Electronically Signed   By: MInez CatalinaM.D.   On: 07/16/2018 15:18   Vas [place]Lower Extremity Venous (dvt)  Result Date: 08/06/2018  Lower Venous Study Indications: Immobility, fever.  Limitations: Patient positioning. Comparison Study: No prior Performing Technologist: MAbram SanderRVS  Examination Guidelines: A complete evaluation includes B-mode imaging, spectral Doppler, color Doppler, and power Doppler as needed of all accessible portions of each vessel. Bilateral testing is considered an integral part of a complete examination. Limited examinations for reoccurring indications may be performed as noted.  +---------+---------------+---------+-----------+----------+--------------+ RIGHT    CompressibilityPhasicitySpontaneityPropertiesSummary        +---------+---------------+---------+-----------+----------+--------------+ CFV      Full           Yes      Yes                                 +---------+---------------+---------+-----------+----------+--------------+ SFJ      Full                                                        +---------+---------------+---------+-----------+----------+--------------+ FV Prox  Full                                                        +---------+---------------+---------+-----------+----------+--------------+  FV Mid   Full                                                         +---------+---------------+---------+-----------+----------+--------------+ FV DistalFull                                                        +---------+---------------+---------+-----------+----------+--------------+ PFV      Full                                                        +---------+---------------+---------+-----------+----------+--------------+ POP      Full           Yes      Yes                                 +---------+---------------+---------+-----------+----------+--------------+ PTV      Full                                                        +---------+---------------+---------+-----------+----------+--------------+ PERO                                                  Not visualized +---------+---------------+---------+-----------+----------+--------------+   +---------+---------------+---------+-----------+----------+--------------+ LEFT     CompressibilityPhasicitySpontaneityPropertiesSummary        +---------+---------------+---------+-----------+----------+--------------+ CFV      Full           Yes      Yes                                 +---------+---------------+---------+-----------+----------+--------------+ SFJ      Full                                                        +---------+---------------+---------+-----------+----------+--------------+ FV Prox  Full                                                        +---------+---------------+---------+-----------+----------+--------------+ FV Mid   Full                                                        +---------+---------------+---------+-----------+----------+--------------+  FV DistalFull                                                        +---------+---------------+---------+-----------+----------+--------------+ PFV      Full                                                         +---------+---------------+---------+-----------+----------+--------------+ POP      Full           Yes      Yes                                 +---------+---------------+---------+-----------+----------+--------------+ PTV      Full                                                        +---------+---------------+---------+-----------+----------+--------------+ PERO                                                  Not visualized +---------+---------------+---------+-----------+----------+--------------+     Summary: Right: There is no evidence of deep vein thrombosis in the lower extremity. No cystic structure found in the popliteal fossa. Left: There is no evidence of deep vein thrombosis in the lower extremity. No cystic structure found in the popliteal fossa.  *See table(s) above for measurements and observations. Electronically signed by Servando Snare MD on 08/06/2018 at 11:00:59 AM.    Final     ASSESSMENT AND PLAN: 1. Pancytopenia -characterized by leukopenia, normocytic anemia, and mild, intermittent thrombocytopenia. 2.  Multiple sclerosis 3.  Fever  Ms. Sturdy appears improved. CBC is stable. Labs do not show evidence of iron deficiency, Vitamin B12 deficiency, or folate deficiency. LDH is elevated, Reticulocytes not elevated. Haptoglobin is normal. SPEP pending. Her ANA was Positive in 03/2018. Repeat is pending. Suspect that pancytopenia is a result of her acute systemic infection, etiology of which is unclear.   1. Continue to monitor daily CBC with diff.   2.  Recommended proceeding with bone marrow biopsy.  The patient refuses to proceed with with a bone marrow biopsy at this time. 3.  Rheumatology consult 4.  Repeat chest CT to follow-up on pulmonary infiltrates 5. May need to consider transfer to [facility] if work-up here remains nondiagnostic    LOS: 15 days   Mikey Bussing, DNP, AGPCNP-BC, AOCNP 08/11/18   Emily Hicks appears unchanged.  She continues to report  feeling much better compared to on hospital admission.  She has persistent fever and anemia/leukopenia.  She has an undefined systemic inflammatory process.  A has again returned positive suggesting the possibility of an underlying collagen vascular disease.  I recommend a rheumatology consult.  I also recommended a bone marrow biopsy to rule out a hematopoietic malignancy and granulomatous disease.  She declines  a bone marrow biopsy.

## 2018-08-11 NOTE — Progress Notes (Addendum)
PROGRESS NOTE    Emily Hicks   VEL:[id number]  DOB: 25-Jan-1986  DOA: 07/27/2018 PCP: [name], MD   Brief Narrative:  Emily Hicks is an 33 y.o.femalewith medical history significant ofmultiple sclerosis, depression who was brought by her husband to the emergency department for the evaluation of altered mental status, fever, generalized weakness.For her MS, she was on Aubagio until a week ago due to intolerance. She presents with Confusion, weakness, loss of appetite,sepsis.Found to have severe oropharyngeal thrush with odynophagia.Emily Hicks has much improved, odynophagia has resolved.  Fever recurred on 08/03/2018.      Subjective: She has no complaints today.   Assessment & Plan:   Principal Problem:   Sepsis - source still not determined- she has oropharyngeal candidiasis but this does not routinely cause fevers - ID consulted and extensive work up ordered  - 6/28- CT chest Abd/ Pelvis> scattered ground-glass airspace opacities bilaterally, greatest within the right upper lobe, mild diffuse adenopathy in chest, abdomen and pelvis, peripancreatic free fluid - the patient was on various different antibiotics early on during the hospital stay (Vanc, Ceftriaxone, Zosyn) but antibiotics stopped on 7/1 by ID and recommendation made to avoid aniboitics - 2 d ECHO > no endocarditis - Blood cultures- contaminant noted on 7/1 cultures> coag neg staph in 1 set - subsequently, blood cultures have been negative - c diff neg - Maxillofacial CT unrevealing - CT chest repeated today shows that lung opacities have resolved - CMV DNA viral load was negative but CMV IgM was positive - ? If GI needs to do EGD/colonoscopy - last fever was 100.5 on 7/7- now has fever again of 101.4  Active Problems:   Candida esophagitis - cont Diflucan  Leukopenia, anemia, mild thrombocytopenia - based on past blood work, it appears she has been slighly leukopenic since 03/31/18 (WBC 3.4 at that  time) - WBC now 1.3- ? If secondary to Aubagio - baseline Hb ~ 12-13- now is ~ 7-8 range - appreciate oncology eval- Dr Ammie Dalton would like to have a bone marrow biopsy performed but the patient is hesitant - SPEP, ANA pending  Swelling of lips - ? Allergic reaction- add Benardryl PRN, Pepcid BID and follow    MS (multiple sclerosis)   - recent diagnosis - she stopped taking Aubagio about 1 wk prior to admission as she was not feeling well - MRI on 6/29- shows no acute findings  Hypernatremia/ dehydration/ slightly elevated Cr on admisson - has resolved  Hypokalemia, Hypophosphatemia, Hypomagnesemia - have been replaced     Mildly elevated LFTs - borderline enlarged liver noted on CT- follow  Severe protein calorie malnutrition - cont dietary supplements  Severe weakness - due to acute illness with underlying MS - will need to go to CIR once clinically improved  Morbid obesity Body mass index is 37.07 kg/m.   Depression - resume Cymbalta and Remeron   Time spent in minutes: 40 min in reviewing prior notes, radiology reports and labs results DVT prophylaxis: Lovenox Code Status: Full code Family Communication:  Disposition Plan: PT recommends CIR Consultants:   ID  GI Procedures:   none Antimicrobials:  Anti-infectives (From admission, onward)   Start     Dose/Rate Route Frequency Ordered Stop   08/14/18 0859  fluconazole (DIFLUCAN) tablet 100 mg     100 mg Oral Weekly 08/07/18 1128     08/08/18 0815  amoxicillin (AMOXIL) capsule 500 mg  Status:  Discontinued     500 mg Oral Every  8 hours 08/08/18 0745 08/08/18 1409   08/06/18 1000  fluconazole (DIFLUCAN) tablet 100 mg  Status:  Discontinued     100 mg Oral Daily 08/05/18 1643 08/07/18 1128   08/04/18 2200  piperacillin-tazobactam (ZOSYN) IVPB 3.375 g  Status:  Discontinued     3.375 g 12.5 mL/hr over 240 Minutes Intravenous Every 8 hours 08/04/18 2156 08/05/18 1643   08/03/18 1000  fluconazole (DIFLUCAN)  tablet 200 mg  Status:  Discontinued     200 mg Oral Daily 08/02/18 1500 08/05/18 1643   07/28/18 1700  vancomycin (VANCOCIN) IVPB 750 mg/150 ml premix  Status:  Discontinued     750 mg 150 mL/hr over 60 Minutes Intravenous Every 24 hours 07/27/18 1606 07/28/18 0947   07/28/18 1000  fluconazole (DIFLUCAN) IVPB 200 mg  Status:  Discontinued     200 mg 100 mL/hr over 60 Minutes Intravenous Every 24 hours 07/28/18 0954 08/02/18 1500   07/27/18 1800  ceFEPIme (MAXIPIME) 2 g in sodium chloride 0.9 % 100 mL IVPB  Status:  Discontinued     2 g 200 mL/hr over 30 Minutes Intravenous Every 8 hours 07/27/18 1606 08/01/18 1414   07/27/18 1730  fluconazole (DIFLUCAN) tablet 200 mg  Status:  Discontinued     200 mg Oral  Once 07/27/18 1717 07/28/18 0953   07/27/18 1615  vancomycin (VANCOCIN) 1,500 mg in sodium chloride 0.9 % 500 mL IVPB     1,500 mg 250 mL/hr over 120 Minutes Intravenous  Once 07/27/18 1606 07/27/18 1851   07/27/18 1415  cefTRIAXone (ROCEPHIN) 1 g in sodium chloride 0.9 % 100 mL IVPB     1 g 200 mL/hr over 30 Minutes Intravenous  Once 07/27/18 1404 07/27/18 1642       Objective: Vitals:   08/10/18 2159 08/11/18 0212 08/11/18 0544 08/11/18 1010  BP: 109/73 103/72 107/73 116/73  Pulse: (!) 122 (!) 117 (!) 119 (!) 133  Resp: '18 16 16   ' Temp: 98.3 F (36.8 C) 99.7 F (37.6 C) 98.8 F (37.1 C)   TempSrc: Oral Oral Oral   SpO2: 99% 100% 99%   Weight:      Height:       No intake or output data in the 24 hours ending 08/11/18 1154 Filed Weights   07/27/18 1745 07/31/18 0300  Weight: 83.3 kg 89 kg    Examination: General exam: Appears comfortable  HEENT: PERRLA, oral mucosa moist, no sclera icterus or  - severely swollen lips, thrush present Respiratory system: Clear to auscultation. Respiratory effort normal. Cardiovascular system: S1 & S2 heard, RRR.   Gastrointestinal system: Abdomen soft, non-tender, nondistended. Normal bowel sounds. Central nervous system: Alert and  oriented. No focal neurological deficits. Extremities: No cyanosis, clubbing or edema Skin: small shallow ulcer under right breast- appears more to be skin breakdown from friction or moisture- no discharge noted Psychiatry:  Flat effect    Data Reviewed: I have personally reviewed following labs and imaging studies  CBC: Recent Labs  Lab 08/05/18 1607  08/08/18 0428 08/09/18 0947 08/09/18 1912 08/10/18 1209 08/11/18 0441  WBC 2.8*   < > 1.9* 1.3* 1.4* 1.1* 1.3*  NEUTROABS 1.8  --   --  1.0* 0.9* 0.7* 0.7*  HGB 8.6*   < > 7.5* 7.1* 7.7* 7.8* 8.1*  HCT 27.7*   < > 24.9* 23.9* 24.7* 26.0* 26.4*  MCV 90.8   < > 90.9 92.3 91.8 92.5 93.3  PLT 154   < > 145* 148* 163  155 178   < > = values in this interval not displayed.   Basic Metabolic Panel: Recent Labs  Lab 08/06/18 0350 08/07/18 0354 08/08/18 0428 08/09/18 0947 08/10/18 1209 08/11/18 0441  NA 140 139 140 140 141 141  K 3.2* 3.3* 3.3* 3.5 3.5 4.3  CL 116* 115* 117* 117* 118* 120*  CO2 17* 18* 18* 16* 15* 18*  GLUCOSE 97 95 87 81 88 88  BUN '6 7 10 13 12 11  ' CREATININE 0.51 0.63 0.84 1.08* 1.06* 1.09*  CALCIUM 7.0* 6.8* 6.4* 7.2* 7.2* 7.2*  MG 1.5* 1.5* 1.4* 2.1  --   --   PHOS <1.0* 1.9* 2.8 2.7  --   --    GFR: Estimated Creatinine Clearance: 75.2 mL/min (A) (by C-G formula based on SCr of 1.09 mg/dL (H)). Liver Function Tests: Recent Labs  Lab 08/05/18 1607 08/08/18 0428 08/09/18 1912  AST 58* 87*  --   ALT 23 28  --   ALKPHOS 83 74  --   BILITOT 0.2* 0.3  --   PROT 5.2* 5.1*  --   ALBUMIN 1.8* 1.7* 2.2*   Recent Labs  Lab 08/04/18 2141  LIPASE 45   No results for input(s): AMMONIA in the last 168 hours. Coagulation Profile: No results for input(s): INR, PROTIME in the last 168 hours. Cardiac Enzymes: No results for input(s): CKTOTAL, CKMB, CKMBINDEX, TROPONINI in the last 168 hours. BNP (last 3 results) No results for input(s): PROBNP in the last 8760 hours. HbA1C: No results for input(s):  HGBA1C in the last 72 hours. CBG: No results for input(s): GLUCAP in the last 168 hours. Lipid Profile: No results for input(s): CHOL, HDL, LDLCALC, TRIG, CHOLHDL, LDLDIRECT in the last 72 hours. Thyroid Function Tests: No results for input(s): TSH, T4TOTAL, FREET4, T3FREE, THYROIDAB in the last 72 hours. Anemia Panel: Recent Labs    08/09/18 1912  VITAMINB12 545  FOLATE 10.0  FERRITIN 3,043*  TIBC 153*  IRON 21*  RETICCTPCT 1.2   Urine analysis:    Component Value Date/Time   COLORURINE YELLOW 08/04/2018 2135   APPEARANCEUR HAZY (A) 08/04/2018 2135   LABSPEC 1.018 08/04/2018 2135   PHURINE 6.0 08/04/2018 2135   GLUCOSEU NEGATIVE 08/04/2018 2135   HGBUR NEGATIVE 08/04/2018 2135   BILIRUBINUR NEGATIVE 08/04/2018 2135   BILIRUBINUR Neg 05/08/2010 1553   KETONESUR NEGATIVE 08/04/2018 2135   PROTEINUR 100 (A) 08/04/2018 2135   UROBILINOGEN 1.0 11/11/2013 0150   NITRITE NEGATIVE 08/04/2018 2135   LEUKOCYTESUR NEGATIVE 08/04/2018 2135   Sepsis Labs: '@LABRCNTIP' (procalcitonin:4,lacticidven:4) ) Recent Results (from the past 240 hour(s))  Urine Culture     Status: None   Collection Time: 08/04/18  9:35 PM   Specimen: Urine, Clean Catch  Result Value Ref Range Status   Specimen Description   Final    URINE, CLEAN CATCH Performed at [facility], [place] [address], [place], [place] [address]    Special Requests   Final    NONE Performed at [facility], [place] [address] [place], [place] [address]    Culture   Final    NO GROWTH Performed at [facility], [place] [address] [place], [place] [address]    Report Status 08/06/2018 FINAL  Final  Culture, blood (routine x 2)     Status: None   Collection Time: 08/04/18  9:41 PM   Specimen: BLOOD  Result Value Ref Range Status   Specimen Description   Final  BLOOD BLOOD RIGHT ARM Performed at [place] [address], [place], [place] [address]    Special  Requests   Final    BOTTLES DRAWN AEROBIC ONLY Blood Culture adequate volume Performed at [place] [address] [place], [place] [address]    Culture   Final    NO GROWTH 5 DAYS Performed at [facility], [place] [address] [place], [place] [address]    Report Status 08/10/2018 FINAL  Final  Culture, blood (routine x 2)     Status: Abnormal   Collection Time: 08/04/18  9:46 PM   Specimen: BLOOD  Result Value Ref Range Status   Specimen Description   Final    BLOOD BLOOD LEFT HAND Performed at [place] [address] [place], [place] [address]    Special Requests   Final    BOTTLES DRAWN AEROBIC AND ANAEROBIC Blood Culture adequate volume Performed at [place] [address] [place], [place] [address]    Culture  Setup Time   Final    AEROBIC BOTTLE ONLY GRAM POSITIVE COCCI CRITICAL RESULT CALLED TO, READ BACK BY AND VERIFIED WITH: J GRIMSLEY PHARMD 08/06/18 0400 JDW    Culture (A)  Final    STAPHYLOCOCCUS SPECIES (COAGULASE NEGATIVE) THE SIGNIFICANCE OF ISOLATING THIS ORGANISM FROM A SINGLE SET OF BLOOD CULTURES WHEN MULTIPLE SETS ARE DRAWN IS UNCERTAIN. PLEASE NOTIFY THE MICROBIOLOGY DEPARTMENT WITHIN ONE WEEK IF SPECIATION AND SENSITIVITIES ARE REQUIRED. Performed at [facility], [place] [address] [place], [place] [address]    Report Status 08/07/2018 FINAL  Final  Blood Culture ID Panel (Reflexed)     Status: Abnormal   Collection Time: 08/04/18  9:46 PM  Result Value Ref Range Status   Enterococcus species NOT DETECTED NOT DETECTED Final   Listeria monocytogenes NOT DETECTED NOT DETECTED Final   Staphylococcus species DETECTED (A) NOT DETECTED Final    Comment: Methicillin (oxacillin) resistant coagulase negative staphylococcus. Possible blood culture contaminant (unless isolated from more than one blood culture draw or clinical case suggests pathogenicity). No antibiotic treatment is  indicated for blood  culture contaminants. CRITICAL RESULT CALLED TO, READ BACK BY AND VERIFIED WITH: J GRIMSLEY PHARMD 08/06/18 0400 JDW    Staphylococcus aureus (BCID) NOT DETECTED NOT DETECTED Final   Methicillin resistance DETECTED (A) NOT DETECTED Final    Comment: CRITICAL RESULT CALLED TO, READ BACK BY AND VERIFIED WITH: J GRIMSLEY PHARMD 08/06/18 0400 JDW    Streptococcus species NOT DETECTED NOT DETECTED Final   Streptococcus agalactiae NOT DETECTED NOT DETECTED Final   Streptococcus pneumoniae NOT DETECTED NOT DETECTED Final   Streptococcus pyogenes NOT DETECTED NOT DETECTED Final   Acinetobacter baumannii NOT DETECTED NOT DETECTED Final   Enterobacteriaceae species NOT DETECTED NOT DETECTED Final   Enterobacter cloacae complex NOT DETECTED NOT DETECTED Final   Escherichia coli NOT DETECTED NOT DETECTED Final   Klebsiella oxytoca NOT DETECTED NOT DETECTED Final   Klebsiella pneumoniae NOT DETECTED NOT DETECTED Final   Proteus species NOT DETECTED NOT DETECTED Final   Serratia marcescens NOT DETECTED NOT DETECTED Final   Haemophilus influenzae NOT DETECTED NOT DETECTED Final   Neisseria meningitidis NOT DETECTED NOT DETECTED Final   Pseudomonas aeruginosa NOT DETECTED NOT DETECTED Final   Candida albicans NOT DETECTED NOT DETECTED Final   Candida glabrata NOT DETECTED NOT DETECTED Final   Candida krusei NOT DETECTED NOT DETECTED Final   Candida parapsilosis NOT DETECTED NOT  DETECTED Final   Candida tropicalis NOT DETECTED NOT DETECTED Final    Comment: Performed at [facility], [place] [address], [place], [place] [address]  Culture, blood (routine x 2)     Status: None   Collection Time: 08/05/18  6:59 PM   Specimen: BLOOD  Result Value Ref Range Status   Specimen Description   Final    BLOOD LEFT HAND Performed at [place] [address], [place], [place] [address]    Special Requests   Final    BOTTLES DRAWN AEROBIC ONLY Blood Culture  adequate volume Performed at [place] [address] [place], [place] [address]    Culture   Final    NO GROWTH 5 DAYS Performed at [facility], [place] [address], [place], [place] [address]    Report Status 08/10/2018 FINAL  Final  C difficile quick scan w PCR reflex     Status: None   Collection Time: 08/07/18 11:29 AM   Specimen: STOOL  Result Value Ref Range Status   C Diff antigen NEGATIVE NEGATIVE Final   C Diff toxin NEGATIVE NEGATIVE Final   C Diff interpretation No C. difficile detected.  Final    Comment: Performed at [facility], [place] [address] [place], [place] [address]  Culture, blood (Routine X 2) w Reflex to ID Panel     Status: None (Preliminary result)   Collection Time: 08/09/18  7:12 PM   Specimen: BLOOD LEFT HAND  Result Value Ref Range Status   Specimen Description   Final    BLOOD LEFT HAND Performed at [place] [address], [place], [place] [address]    Special Requests   Final    BOTTLES DRAWN AEROBIC ONLY Blood Culture adequate volume Performed at [place] [address], [place], [place] [address]    Culture   Final    NO GROWTH 2 DAYS Performed at [facility] [address] [place] [address], [place], [place] [address]    Report Status PENDING  Incomplete  Culture, blood (Routine X 2) w Reflex to ID Panel     Status: None (Preliminary result)   Collection Time: 08/09/18  7:12 PM   Specimen: BLOOD LEFT HAND  Result Value Ref Range Status   Specimen Description   Final    BLOOD LEFT HAND Performed at [place] [address] [place], [place] [address]    Special Requests   Final    BOTTLES DRAWN AEROBIC ONLY Blood Culture adequate volume Performed at [place] [address], [place], [place] [address]    Culture   Final    NO GROWTH 2 DAYS Performed at [place] [address], [place],   [address]    Report Status PENDING  Incomplete         Radiology Studies: Ct Chest W Contrast  Result Date: 08/11/2018 CLINICAL DATA:  Fevers of unknown origin. EXAM: CT CHEST WITH CONTRAST TECHNIQUE: Multidetector CT imaging of the chest was performed during intravenous contrast administration. CONTRAST:  75 mL OMNIPAQUE IOHEXOL 300 MG/ML  SOLN COMPARISON:  CT chest, abdomen and pelvis 08/01/2018. PA and lateral chest 07/27/2010 and 08/05/2018 FINDINGS: Cardiovascular: Heart size is mildly enlarged. No pericardial effusion. No atherosclerotic vascular disease is seen. Mediastinum/Nodes: Small bilateral axillary lymph nodes have clearly visible fatty hila consistent with benignity. No mediastinal or hilar lymphadenopathy by CT size criteria.  Lungs/Pleura: Small bilateral pleural effusions are not notably changed. Dependent bilateral lower lobe airspace disease is improved since the prior examination. Residual opacities have an appearance most compatible with atelectasis. Scattered ground-glass opacity seen on the comparison CT have resolved. 0.3 cm right upper lobe nodule on image 26 of series 5 is unchanged. Upper Abdomen: Status post cholecystectomy.  No acute finding. Musculoskeletal: T11 hemivertebra noted. No acute or focal abnormality. IMPRESSION: Scattered ground-glass opacities seen on the comparison CT have resolved. Dependent lower lobe airspace disease bilaterally is improved. Residual opacities have an appearance most compatible with atelectasis rather than pneumonia. No change in small bilateral pleural effusions. No change in a 0.3 cm right upper lobe pulmonary nodule. No follow-up needed if patient is low-risk. Non-contrast chest CT can be considered in 12 months if patient is high-risk. This recommendation follows the consensus statement: Guidelines for Management of Incidental Pulmonary Nodules Detected on CT Images: From the Fleischner Society 2017; Radiology 2017; 284:228-243. Cardiomegaly.  Electronically Signed   By: Inge Rise M.D.   On: 08/11/2018 11:01      Scheduled Meds:  feeding supplement  1 Container Oral Q24H   feeding supplement (ENSURE ENLIVE)  237 mL Oral Q24H   feeding supplement (PRO-STAT SUGAR FREE 64)  30 mL Oral BID   [START ON 08/14/2018] fluconazole  100 mg Oral Weekly   folic acid  2 mg Oral Daily   free water  300 mL Oral Q8H   hydrocortisone  25 mg Rectal BID   lidocaine  15 mL Mouth/Throat TID AC & HS   magnesium oxide  400 mg Oral BID   metoprolol tartrate  25 mg Oral BID   multivitamin with minerals  1 tablet Oral Daily   phosphorus  500 mg Oral TID   sodium chloride (PF)       sodium chloride flush  3 mL Intravenous Once   Continuous Infusions:   LOS: 15 days      Debbe Odea, MD [facility] Pager: [email or web address] Password [facility] 08/11/2018, 11:54 AM

## 2018-08-11 NOTE — Progress Notes (Signed)
Patients husband called and updated.

## 2018-08-11 NOTE — Progress Notes (Signed)
Spoke with pt's brother, Bryon Lions.  He is concerned on hearing from pt's husband that his sister is "refusing some treatments."  He was hopeful to be able to see sister on his clergy badge.  Chaplain provided support around sister's hospitalization, and visitation policies in context of COVID.  Spiritual care will follow with this patient for support.  This chaplain will see patient tomorrow to assess emotional / spiritual support needs in context of this lengthy hospitalization.

## 2018-08-12 LAB — CBC WITH DIFFERENTIAL/PLATELET
Abs Immature Granulocytes: 0.05 10*3/uL (ref 0.00–0.07)
Basophils Absolute: 0 10*3/uL (ref 0.0–0.1)
Basophils Relative: 1 %
Eosinophils Absolute: 0 10*3/uL (ref 0.0–0.5)
Eosinophils Relative: 0 %
HCT: 26.2 % — ABNORMAL LOW (ref 36.0–46.0)
Hemoglobin: 7.8 g/dL — ABNORMAL LOW (ref 12.0–15.0)
Immature Granulocytes: 3 %
Lymphocytes Relative: 22 %
Lymphs Abs: 0.3 10*3/uL — ABNORMAL LOW (ref 0.7–4.0)
MCH: 27.4 pg (ref 26.0–34.0)
MCHC: 29.8 g/dL — ABNORMAL LOW (ref 30.0–36.0)
MCV: 91.9 fL (ref 80.0–100.0)
Monocytes Absolute: 0.2 10*3/uL (ref 0.1–1.0)
Monocytes Relative: 10 %
Neutro Abs: 0.9 10*3/uL — ABNORMAL LOW (ref 1.7–7.7)
Neutrophils Relative %: 64 %
Platelets: 121 10*3/uL — ABNORMAL LOW (ref 150–400)
RBC: 2.85 MIL/uL — ABNORMAL LOW (ref 3.87–5.11)
RDW: 19.9 % — ABNORMAL HIGH (ref 11.5–15.5)
WBC: 1.5 10*3/uL — ABNORMAL LOW (ref 4.0–10.5)
nRBC: 0 % (ref 0.0–0.2)

## 2018-08-12 LAB — MAGNESIUM: Magnesium: 1.4 mg/dL — ABNORMAL LOW (ref 1.7–2.4)

## 2018-08-12 LAB — BASIC METABOLIC PANEL
Anion gap: 9 (ref 5–15)
BUN: 12 mg/dL (ref 6–20)
CO2: 16 mmol/L — ABNORMAL LOW (ref 22–32)
Calcium: 7.2 mg/dL — ABNORMAL LOW (ref 8.9–10.3)
Chloride: 118 mmol/L — ABNORMAL HIGH (ref 98–111)
Creatinine, Ser: 0.92 mg/dL (ref 0.44–1.00)
GFR calc Af Amer: >60 mL/min (ref >60)
GFR calc non Af Amer: >60 mL/min (ref >60)
Glucose, Bld: 87 mg/dL (ref 70–99)
Potassium: 3.5 mmol/L (ref 3.5–5.1)
Sodium: 143 mmol/L (ref 135–145)

## 2018-08-12 LAB — LEGIONELLA PNEUMOPHILA SEROGP 1 UR AG: L. pneumophila Serogp 1 Ur Ag: NEGATIVE

## 2018-08-12 LAB — PHOSPHORUS: Phosphorus: 1.7 mg/dL — ABNORMAL LOW (ref 2.5–4.6)

## 2018-08-12 MED ORDER — MAGNESIUM SULFATE 4 GM/100ML IV SOLN
4.0000 g | Freq: Once | INTRAVENOUS | Status: AC
  Administered 2018-08-12: 4 g via INTRAVENOUS
  Filled 2018-08-12: qty 100

## 2018-08-12 MED ORDER — SODIUM PHOSPHATES 45 MMOLE/15ML IV SOLN
30.0000 mmol | Freq: Once | INTRAVENOUS | Status: AC
  Administered 2018-08-13: 30 mmol via INTRAVENOUS
  Filled 2018-08-12: qty 10

## 2018-08-12 MED ORDER — MAGNESIUM SULFATE 4 GM/100ML IV SOLN
4.0000 g | Freq: Once | INTRAVENOUS | Status: DC
  Filled 2018-08-12: qty 100

## 2018-08-12 NOTE — Progress Notes (Signed)
Physical Therapy Treatment Patient Details Name: Emily Hicks MRN: [id number] DOB: 08/19/1985 Today's Date: 08/12/2018    History of Present Illness This 33 year old female was admitted for progressive weakness and AMS.  She was recently dxd with MS    PT Comments    Assisted OOB to amb to bathroom..General Gait Details: slow sluggish gait with limited distance due to elevated HR up to 150's  Follow Up Recommendations  CIR     Equipment Recommendations       Recommendations for Other Services       Precautions / Restrictions Precautions Precautions: Fall Precaution Comments: monitor HR Restrictions Weight Bearing Restrictions: No    Mobility  Bed Mobility Overal bed mobility: Needs Assistance Bed Mobility: Supine to Sit Rolling: Mod assist         General bed mobility comments: Mod Assist for upper body supine to sit and increased time  Transfers Overall transfer level: Needs assistance Equipment used: Rolling walker (2 wheeled) Transfers: Sit to/from Stand Sit to Stand: Min assist;Min guard Stand pivot transfers: Min guard;Min assist       General transfer comment: min A to rise, VCs hand placement increased time and safety with turns.  Also performed toilet transfer  Ambulation/Gait Ambulation/Gait assistance: Min guard Gait Distance (Feet): 22 Feet(11 x 2 to and from bathroom) Assistive device: Rolling walker (2 wheeled) Gait Pattern/deviations: Step-to pattern;Decreased step length - right;Decreased step length - left;Wide base of support Gait velocity: decreased   General Gait Details: slow sluggish gait with limited distance due to elevated HR up to 150's   Stairs             Wheelchair Mobility    Modified Rankin (Stroke Patients Only)       Balance                                            Cognition Arousal/Alertness: Awake/alert Behavior During Therapy: Flat affect                                   General Comments: followed all commands; flat affect      Exercises      General Comments        Pertinent Vitals/Pain Pain Assessment: Faces Faces Pain Scale: Hurts a little bit Pain Location: tooth Pain Descriptors / Indicators: Constant    Home Living                      Prior Function            PT Goals (current goals can now be found in the care plan section) Progress towards PT goals: Progressing toward goals    Frequency    Min 4X/week      PT Plan Current plan remains appropriate    Co-evaluation              AM-PAC PT "6 Clicks" Mobility   Outcome Measure  Help needed turning from your back to your side while in a flat bed without using bedrails?: A Little Help needed moving from lying on your back to sitting on the side of a flat bed without using bedrails?: A Little Help needed moving to and from a bed to a chair (including a wheelchair)?: A Little Help  needed standing up from a chair using your arms (e.g., wheelchair or bedside chair)?: A Little Help needed to walk in hospital room?: A Little Help needed climbing 3-5 steps with a railing? : A Lot 6 Click Score: 17    End of Session Equipment Utilized During Treatment: Gait belt Activity Tolerance: Other (comment) Patient left: in chair;with call bell/phone within reach;with chair alarm set Nurse Communication: Mobility status PT Visit Diagnosis: Other abnormalities of gait and mobility (R26.89);Difficulty in walking, not elsewhere classified (R26.2)     Time: 6213-0865 PT Time Calculation (min) (ACUTE ONLY): 31 min  Charges:  $Gait Training: 8-22 mins $Therapeutic Activity: 8-22 mins                     Rica Koyanagi  PTA Acute  Rehabilitation Services Pager      [phone] Office      [phone]

## 2018-08-12 NOTE — Progress Notes (Signed)
°PROGRESS NOTE ° ° ° °Emily Hicks   °MRN:[id number]  °DOB: 09/24/1985  °DOA: 07/27/2018 °PCP: Olson, Daniel K, MD  ° °Brief Narrative:  °Emily Hicks is an 33 y.o. female with medical history significant of multiple sclerosis, depression who was brought by her husband to the emergency department for the evaluation of altered mental status, fever, generalized weakness.  For her MS, she was on Aubagio until a week ago due to intolerance. She presents with Confusion, weakness, loss of appetite,sepsis.  Found to have severe oropharyngeal thrush with odynophagia.  Thrush has much improved, odynophagia has resolved.  Fever recurred on 08/03/2018.    ° ° °Subjective: °She has no complaints. I have discussed the need for a biopsy with her but she states she is afraid to have it done. I have discussed that her brother would like her to have a psychiatry consult but she denies that she is depressed. She has no complaints.  ° °Assessment & Plan: °  °Principal Problem: °  Sepsis °- source still not determined- she has oropharyngeal candidiasis but this does not routinely cause fevers °- ID consulted and extensive work up ordered  °- 6/28- CT chest Abd/ Pelvis> scattered ground-glass airspace opacities bilaterally, greatest within the right upper lobe, mild diffuse adenopathy in chest, abdomen and pelvis, peripancreatic free fluid °- the patient was on various different antibiotics early on during the hospital stay (Vanc, Ceftriaxone, Zosyn) but antibiotics stopped on 7/1 by ID and recommendation made to avoid aniboitics °- 2 d ECHO > no endocarditis °- Blood cultures- contaminant noted on 7/1 cultures> coag neg staph in 1 set °- subsequently, blood cultures have been negative °- c diff neg °- Maxillofacial CT unrevealing °- elevated CMV IgM but CMV PCR negative °- CT chest repeated today shows that lung opacities have resolved °- last fever was 100.5 on 7/7- now has fever again of 101.4 °- ANA is positive 1:1280- will order a  reflex panel, complement C3 and C4- of not a reflex panel was done in Feb of this year and she was noted to have a positive anti dsDNA, anti-smith, anti-RNP, Anti-SSA- since we do not have an inpatient rheumatology service, I will try to transfer her to a tertiary care center- she is in agreement with this ° °Active Problems: °  Candida esophagitis °- cont Diflucan ° °Leukopenia, anemia, mild thrombocytopenia °- based on past blood work, it appears she has been slighly leukopenic since 03/31/18 (WBC 3.4 at that time) °- WBC now 1.3- ? If secondary to Aubagio °- baseline Hb ~ 12-13- now is ~ 7-8 range °- appreciate oncology eval- Dr Sherril would like to have a bone marrow biopsy performed but the patient is hesitant °- SPEP negative °- see above about ANA results- she continues to refuse a bone marrow biopsy today ° °Swelling of lips °- ? Allergic reaction vs due to autoimmune process °-  added Benardryl PRN & Pepcid BID but these have not helped ° °  MS (multiple sclerosis)   °- recent diagnosis - she stopped taking Aubagio about 1 wk prior to admission as she was not feeling well °- MRI on 6/29- shows no acute findings ° °Hypernatremia/ dehydration/ slightly elevated Cr on admisson °- has resolved ° °Hypokalemia, Hypophosphatemia, Hypomagnesemia °- have been replaced °  °  Mildly elevated LFTs °- borderline enlarged liver noted on CT- follow ° °Severe protein calorie malnutrition °- cont dietary supplements ° °Severe weakness °- due to acute illness with underlying MS °- will need to go to CIR once clinically   improved ° °Morbid obesity °Body mass index is 37.07 kg/m².  ° °Depression °- resume Cymbalta and Remeron ° ° °Time spent in minutes: 40 min in reviewing prior notes, radiology reports and labs results °DVT prophylaxis: Lovenox °Code Status: Full code °Family Communication:  °Disposition Plan: PT recommends CIR °Consultants:  °· ID °· GI °Procedures:  °· none °Antimicrobials:  °Anti-infectives (From admission,  onward)  ° Start     Dose/Rate Route Frequency Ordered Stop  ° 08/14/18 0859  fluconazole (DIFLUCAN) tablet 100 mg    ° 100 mg Oral Weekly 08/07/18 1128    ° 08/08/18 0815  amoxicillin (AMOXIL) capsule 500 mg  Status:  Discontinued    ° 500 mg Oral Every 8 hours 08/08/18 0745 08/08/18 1409  ° 08/06/18 1000  fluconazole (DIFLUCAN) tablet 100 mg  Status:  Discontinued    ° 100 mg Oral Daily 08/05/18 1643 08/07/18 1128  ° 08/04/18 2200  piperacillin-tazobactam (ZOSYN) IVPB 3.375 g  Status:  Discontinued    ° 3.375 g °12.5 mL/hr over 240 Minutes Intravenous Every 8 hours 08/04/18 2156 08/05/18 1643  ° 08/03/18 1000  fluconazole (DIFLUCAN) tablet 200 mg  Status:  Discontinued    ° 200 mg Oral Daily 08/02/18 1500 08/05/18 1643  ° 07/28/18 1700  vancomycin (VANCOCIN) IVPB 750 mg/150 ml premix  Status:  Discontinued    ° 750 mg °150 mL/hr over 60 Minutes Intravenous Every 24 hours 07/27/18 1606 07/28/18 0947  ° 07/28/18 1000  fluconazole (DIFLUCAN) IVPB 200 mg  Status:  Discontinued    ° 200 mg °100 mL/hr over 60 Minutes Intravenous Every 24 hours 07/28/18 0954 08/02/18 1500  ° 07/27/18 1800  ceFEPIme (MAXIPIME) 2 g in sodium chloride 0.9 % 100 mL IVPB  Status:  Discontinued    ° 2 g °200 mL/hr over 30 Minutes Intravenous Every 8 hours 07/27/18 1606 08/01/18 1414  ° 07/27/18 1730  fluconazole (DIFLUCAN) tablet 200 mg  Status:  Discontinued    ° 200 mg Oral  Once 07/27/18 1717 07/28/18 0953  ° 07/27/18 1615  vancomycin (VANCOCIN) 1,500 mg in sodium chloride 0.9 % 500 mL IVPB    ° 1,500 mg °250 mL/hr over 120 Minutes Intravenous  Once 07/27/18 1606 07/27/18 1851  ° 07/27/18 1415  cefTRIAXone (ROCEPHIN) 1 g in sodium chloride 0.9 % 100 mL IVPB    ° 1 g °200 mL/hr over 30 Minutes Intravenous  Once 07/27/18 1404 07/27/18 1642  °  ° ° ° °Objective: °Vitals:  ° 08/11/18 1450 08/11/18 2049 08/12/18 0507 08/12/18 1511  °BP:  116/78 121/74 121/81  °Pulse:  (!) 127 (!) 122 (!) 126  °Resp:  20 19 14  °Temp: (!) 100.4 °F (38 °C) 99.7 °F  (37.6 °C) 98.7 °F (37.1 °C) 98.2 °F (36.8 °C)  °TempSrc: Oral Oral Oral Oral  °SpO2:  96% 94% 100%  °Weight:      °Height:      ° ° °Intake/Output Summary (Last 24 hours) at 08/12/2018 1618 °Last data filed at 08/12/2018 0700 °Gross per 24 hour  °Intake 140 ml  °Output 490 ml  °Net -350 ml  ° °Filed Weights  ° 07/27/18 1745 07/31/18 0300  °Weight: 83.3 kg 89 kg  ° ° °Examination: °General exam: Appears comfortable  °HEENT: PERRLA, oral mucosa moist, no sclera icterus or thrush- lips remain severely swollen °Respiratory system: Clear to auscultation. Respiratory effort normal. °Cardiovascular system: S1 & S2 heard,  No murmurs  °Gastrointestinal system: Abdomen soft, non-tender, nondistended. Normal bowel   sounds   °Central nervous system: Alert and oriented. No focal neurological deficits. °Extremities: No cyanosis, clubbing or edema °Skin: No rashes - shallow ulcer under left breast °Psychiatry: flat affect ° ° ° °Data Reviewed: I have personally reviewed following labs and imaging studies ° °CBC: °Recent Labs  °Lab 08/09/18 °0947 08/09/18 °1912 08/10/18 °1209 08/11/18 °0441 08/12/18 °1026  °WBC 1.3* 1.4* 1.1* 1.3* 1.5*  °NEUTROABS 1.0* 0.9* 0.7* 0.7* 0.9*  °HGB 7.1* 7.7* 7.8* 8.1* 7.8*  °HCT 23.9* 24.7* 26.0* 26.4* 26.2*  °MCV 92.3 91.8 92.5 93.3 91.9  °PLT 148* 163 155 178 121*  ° °Basic Metabolic Panel: °Recent Labs  °Lab 08/06/18 °0350 08/07/18 °0354 08/08/18 °0428 08/09/18 °0947 08/10/18 °1209 08/11/18 °0441 08/12/18 °1026  °NA 140 139 140 140 141 141 143  °K 3.2* 3.3* 3.3* 3.5 3.5 4.3 3.5  °CL 116* 115* 117* 117* 118* 120* 118*  °CO2 17* 18* 18* 16* 15* 18* 16*  °GLUCOSE 97 95 87 81 88 88 87  °BUN 6 7 10 13 12 11 12  °CREATININE 0.51 0.63 0.84 1.08* 1.06* 1.09* 0.92  °CALCIUM 7.0* 6.8* 6.4* 7.2* 7.2* 7.2* 7.2*  °MG 1.5* 1.5* 1.4* 2.1  --   --  1.4*  °PHOS <1.0* 1.9* 2.8 2.7  --   --  1.7*  ° °GFR: °Estimated Creatinine Clearance: 89.1 mL/min (by C-G formula based on SCr of 0.92 mg/dL). °Liver Function  Tests: °Recent Labs  °Lab 08/08/18 °0428 08/09/18 °1912  °AST 87*  --   °ALT 28  --   °ALKPHOS 74  --   °BILITOT 0.3  --   °PROT 5.1*  --   °ALBUMIN 1.7* 2.2*  ° °No results for input(s): LIPASE, AMYLASE in the last 168 hours. °No results for input(s): AMMONIA in the last 168 hours. °Coagulation Profile: °No results for input(s): INR, PROTIME in the last 168 hours. °Cardiac Enzymes: °No results for input(s): CKTOTAL, CKMB, CKMBINDEX, TROPONINI in the last 168 hours. °BNP (last 3 results) °No results for input(s): PROBNP in the last 8760 hours. °HbA1C: °No results for input(s): HGBA1C in the last 72 hours. °CBG: °No results for input(s): GLUCAP in the last 168 hours. °Lipid Profile: °No results for input(s): CHOL, HDL, LDLCALC, TRIG, CHOLHDL, LDLDIRECT in the last 72 hours. °Thyroid Function Tests: °No results for input(s): TSH, T4TOTAL, FREET4, T3FREE, THYROIDAB in the last 72 hours. °Anemia Panel: °Recent Labs  °  08/09/18 °1912  °VITAMINB12 545  °FOLATE 10.0  °FERRITIN 3,043*  °TIBC 153*  °IRON 21*  °RETICCTPCT 1.2  ° °Urine analysis: °   °Component Value Date/Time  ° COLORURINE YELLOW 08/04/2018 2135  ° APPEARANCEUR HAZY (A) 08/04/2018 2135  ° LABSPEC 1.018 08/04/2018 2135  ° PHURINE 6.0 08/04/2018 2135  ° GLUCOSEU NEGATIVE 08/04/2018 2135  ° HGBUR NEGATIVE 08/04/2018 2135  ° BILIRUBINUR NEGATIVE 08/04/2018 2135  ° BILIRUBINUR Neg 05/08/2010 1553  ° KETONESUR NEGATIVE 08/04/2018 2135  ° PROTEINUR 100 (A) 08/04/2018 2135  ° UROBILINOGEN 1.0 11/11/2013 0150  ° NITRITE NEGATIVE 08/04/2018 2135  ° LEUKOCYTESUR NEGATIVE 08/04/2018 2135  ° °Sepsis Labs: °@LABRCNTIP(procalcitonin:4,lacticidven:4) °) °Recent Results (from the past 240 hour(s))  °Urine Culture     Status: None  ° Collection Time: 08/04/18  9:35 PM  ° Specimen: Urine, Clean Catch  °Result Value Ref Range Status  ° Specimen Description   Final  °  URINE, CLEAN CATCH °Performed at [facility], [address] [place], [place] [address] °   ° Special Requests     Final  °  NONE °Performed at [facility], [address] [place], [place] [address] °  ° Culture   Final  °  NO GROWTH °Performed at [facility], [address] [place], [place] [address] °  ° Report Status 08/06/2018 FINAL  Final  °Culture, blood (routine x 2)     Status: None  ° Collection Time: 08/04/18  9:41 PM  ° Specimen: BLOOD  °Result Value Ref Range Status  ° Specimen Description   Final  °  BLOOD BLOOD RIGHT ARM °Performed at [facility], [address] [place], [place] [address] °  ° Special Requests   Final  °  BOTTLES DRAWN AEROBIC ONLY Blood Culture adequate volume °Performed at [facility], [address] [place], [place] [address] °  ° Culture   Final  °  NO GROWTH 5 DAYS °Performed at [facility], [address] [place], [place] [address] °  ° Report Status 08/10/2018 FINAL  Final  °Culture, blood (routine x 2)     Status: Abnormal  ° Collection Time: 08/04/18  9:46 PM  ° Specimen: BLOOD  °Result Value Ref Range Status  ° Specimen Description   Final  °  BLOOD BLOOD LEFT HAND °Performed at [facility], [address] [place], [place] [address] °  ° Special Requests   Final  °  BOTTLES DRAWN AEROBIC AND ANAEROBIC Blood Culture adequate volume °Performed at [facility], [address] ,  [address] °  ° Culture  Setup Time   Final  °  AEROBIC BOTTLE ONLY °GRAM POSITIVE COCCI °CRITICAL RESULT CALLED TO, READ BACK BY AND VERIFIED WITH: J GRIMSLEY PHARMD 08/06/18 0400 JDW °  ° Culture (A)  Final  °  STAPHYLOCOCCUS SPECIES (COAGULASE NEGATIVE) °THE SIGNIFICANCE OF ISOLATING THIS ORGANISM FROM A SINGLE SET OF BLOOD CULTURES WHEN MULTIPLE SETS ARE DRAWN IS UNCERTAIN. PLEASE NOTIFY THE MICROBIOLOGY DEPARTMENT WITHIN ONE WEEK IF SPECIATION AND SENSITIVITIES ARE REQUIRED. °Performed at [facility], [address] ,  [address] °  ° Report  Status 08/07/2018 FINAL  Final  °Blood Culture ID Panel (Reflexed)     Status: Abnormal  ° Collection Time: 08/04/18  9:46 PM  °Result Value Ref Range Status  ° Enterococcus species NOT DETECTED NOT DETECTED Final  ° Listeria monocytogenes NOT DETECTED NOT DETECTED Final  ° Staphylococcus species DETECTED (A) NOT DETECTED Final  °  Comment: Methicillin (oxacillin) resistant coagulase negative staphylococcus. Possible blood culture contaminant (unless isolated from more than one blood culture draw or clinical case suggests pathogenicity). No antibiotic treatment is indicated for blood  °culture contaminants. °CRITICAL RESULT CALLED TO, READ BACK BY AND VERIFIED WITH: °J GRIMSLEY PHARMD 08/06/18 0400 JDW °  ° Staphylococcus aureus (BCID) NOT DETECTED NOT DETECTED Final  ° Methicillin resistance DETECTED (A) NOT DETECTED Final  °  Comment: CRITICAL RESULT CALLED TO, READ BACK BY AND VERIFIED WITH: °J GRIMSLEY PHARMD 08/06/18 0400 JDW °  ° Streptococcus species NOT DETECTED NOT DETECTED Final  ° Streptococcus agalactiae NOT DETECTED NOT DETECTED Final  ° Streptococcus pneumoniae NOT DETECTED NOT DETECTED Final  ° Streptococcus pyogenes NOT DETECTED NOT DETECTED Final  ° Acinetobacter baumannii NOT DETECTED NOT DETECTED Final  ° Enterobacteriaceae species NOT DETECTED NOT DETECTED Final  ° Enterobacter cloacae complex NOT DETECTED NOT DETECTED Final  ° Escherichia coli NOT DETECTED NOT DETECTED Final  ° Klebsiella oxytoca NOT DETECTED   NOT DETECTED Final  ° Klebsiella pneumoniae NOT DETECTED NOT DETECTED Final  ° Proteus species NOT DETECTED NOT DETECTED Final  ° Serratia marcescens NOT DETECTED NOT DETECTED Final  ° Haemophilus influenzae NOT DETECTED NOT DETECTED Final  ° Neisseria meningitidis NOT DETECTED NOT DETECTED Final  ° Pseudomonas aeruginosa NOT DETECTED NOT DETECTED Final  ° Candida albicans NOT DETECTED NOT DETECTED Final  ° Candida glabrata NOT DETECTED NOT DETECTED Final  ° Candida krusei NOT DETECTED NOT  DETECTED Final  ° Candida parapsilosis NOT DETECTED NOT DETECTED Final  ° Candida tropicalis NOT DETECTED NOT DETECTED Final  °  Comment: Performed at [facility], [address] [place], [place] [address]  °Culture, blood (routine x 2)     Status: None  ° Collection Time: 08/05/18  6:59 PM  ° Specimen: BLOOD  °Result Value Ref Range Status  ° Specimen Description   Final  °  BLOOD LEFT HAND °Performed at [facility], [address] [place], [place] [address] °  ° Special Requests   Final  °  BOTTLES DRAWN AEROBIC ONLY Blood Culture adequate volume °Performed at [facility], [address] [place], [place] [address] °  ° Culture   Final  °  NO GROWTH 5 DAYS °Performed at [facility], [address] [place], [place] [address] °  ° Report Status 08/10/2018 FINAL  Final  °C difficile quick scan w PCR reflex     Status: None  ° Collection Time: 08/07/18 11:29 AM  ° Specimen: STOOL  °Result Value Ref Range Status  ° C Diff antigen NEGATIVE NEGATIVE Final  ° C Diff toxin NEGATIVE NEGATIVE Final  ° C Diff interpretation No C. difficile detected.  Final  °  Comment: Performed at [facility], [address] [place], [place] [address]  °Culture, blood (Routine X 2) w Reflex to ID Panel     Status: None (Preliminary result)  ° Collection Time: 08/09/18  7:12 PM  ° Specimen: BLOOD LEFT HAND  °Result Value Ref Range Status  ° Specimen Description   Final  °  BLOOD LEFT HAND °Performed at [facility], [address] [place], [place] [address] °  ° Special Requests   Final  °  BOTTLES DRAWN AEROBIC ONLY Blood Culture adequate volume °Performed at [facility], [address] [place], [place] [address] °  ° Culture   Final  °  NO GROWTH 3 DAYS °Performed at [facility], [address] [place], [place] [address] °  ° Report Status PENDING  Incomplete  °Culture, blood (Routine X 2) w Reflex to ID Panel      Status: None (Preliminary result)  ° Collection Time: 08/09/18  7:12 PM  ° Specimen: BLOOD LEFT HAND  °Result Value Ref Range Status  ° Specimen Description   Final  °  BLOOD LEFT HAND °Performed at  [facility], [address] [place], [place] [address] °  ° Special Requests   Final  °  BOTTLES DRAWN AEROBIC ONLY Blood Culture adequate volume °Performed at  [facility], [address] [place], [place] [address] °  ° Culture   Final  °  NO GROWTH 3 DAYS °Performed at  [facility], [address] , [place] [address] °  ° Report Status PENDING  Incomplete  °Culture, blood (routine x 2)     Status: None (Preliminary result)  °   Collection Time: 08/11/18  2:59 PM   Specimen: BLOOD  Result Value Ref Range Status   Specimen Description   Final    BLOOD LEFT ANTECUBITAL Performed at [facility] [address], [place], [place] [address]    Special Requests   Final    BOTTLES DRAWN AEROBIC ONLY Blood Culture adequate volume Performed at [facility] [address] [place], [place] [address]    Culture   Final    NO GROWTH < 24 HOURS Performed at [facility] [address], [place], [place] [address]    Report Status PENDING  Incomplete  Culture, blood (routine x 2)     Status: None (Preliminary result)   Collection Time: 08/11/18  2:59 PM   Specimen: BLOOD LEFT HAND  Result Value Ref Range Status   Specimen Description   Final    BLOOD LEFT HAND Performed at [facility] [address], [place], [place] [address]    Special Requests   Final    BOTTLES DRAWN AEROBIC ONLY Blood Culture adequate volume Performed at [facility] [address], [place], [place] [address]    Culture   Final    NO GROWTH < 24 HOURS Performed at [facility] [address] [place], [place] [address]    Report Status PENDING  Incomplete         Radiology  Studies: Ct Chest W Contrast  Result Date: 08/11/2018 CLINICAL DATA:  Fevers of unknown origin. EXAM: CT CHEST WITH CONTRAST TECHNIQUE: Multidetector CT imaging of the chest was performed during intravenous contrast administration. CONTRAST:  75 mL OMNIPAQUE IOHEXOL 300 MG/ML  SOLN COMPARISON:  CT chest, abdomen and pelvis 08/01/2018. PA and lateral chest 07/27/2010 and 08/05/2018 FINDINGS: Cardiovascular: Heart size is mildly enlarged. No pericardial effusion. No atherosclerotic vascular disease is seen. Mediastinum/Nodes: Small bilateral axillary lymph nodes have clearly visible fatty hila consistent with benignity. No mediastinal or hilar lymphadenopathy by CT size criteria. Lungs/Pleura: Small bilateral pleural effusions are not notably changed. Dependent bilateral lower lobe airspace disease is improved since the prior examination. Residual opacities have an appearance most compatible with atelectasis. Scattered ground-glass opacity seen on the comparison CT have resolved. 0.3 cm right upper lobe nodule on image 26 of series 5 is unchanged. Upper Abdomen: Status post cholecystectomy.  No acute finding. Musculoskeletal: T11 hemivertebra noted. No acute or focal abnormality. IMPRESSION: Scattered ground-glass opacities seen on the comparison CT have resolved. Dependent lower lobe airspace disease bilaterally is improved. Residual opacities have an appearance most compatible with atelectasis rather than pneumonia. No change in small bilateral pleural effusions. No change in a 0.3 cm right upper lobe pulmonary nodule. No follow-up needed if patient is low-risk. Non-contrast chest CT can be considered in 12 months if patient is high-risk. This recommendation follows the consensus statement: Guidelines for Management of Incidental Pulmonary Nodules Detected on CT Images: From the Fleischner Society 2017; Radiology 2017; 284:228-243. Cardiomegaly. Electronically Signed   By: Inge Rise M.D.   On: 08/11/2018  11:01      Scheduled Meds:  DULoxetine  60 mg Oral Daily   enoxaparin (LOVENOX) injection  40 mg Subcutaneous Q24H   feeding supplement  1 Container Oral Q24H   feeding supplement (ENSURE ENLIVE)  237 mL Oral Q24H   feeding supplement (PRO-STAT SUGAR FREE 64)  30 mL Oral BID   [START ON 08/14/2018] fluconazole  100 mg Oral Weekly   folic acid  2 mg Oral Daily  °• free water  300 mL Oral Q8H  °• hydrocortisone  25 mg Rectal BID  °• magnesium oxide  400 mg Oral BID  °• metoprolol tartrate  25 mg Oral BID  °• mirtazapine  15 mg Oral QHS  °• multivitamin with minerals  1 tablet Oral Daily  °• phosphorus  500 mg Oral TID  °• sodium chloride flush  3 mL Intravenous Once  ° °Continuous Infusions: °• famotidine (PEPCID) IV 20 mg (08/12/18 1205)  °• magnesium sulfate bolus IVPB    ° ° ° LOS: 16 days  ° ° ° ° ° , MD °[facility] °Pager: °[email or web address] °Password [email or web address] °08/12/2018, 4:18 PM  °

## 2018-08-12 NOTE — Progress Notes (Addendum)
HEMATOLOGY-ONCOLOGY PROGRESS NOTE  SUBJECTIVE: T-max 101.4 in the past 24 hours.  Repeat blood cultures have been drawn.  She denies chills. Denies bleeding.  REVIEW OF SYSTEMS:   Constitutional: Still having intermittent fevers. No chills.  Respiratory: Denies cough, dyspnea or wheezes Cardiovascular: Denies palpitation, chest discomfort Gastrointestinal:  Denies nausea, vomiting. Diarrhea has resolved.  Skin: Denies abnormal skin rashes Lymphatics: Denies new lymphadenopathy or easy bruising Neurological:Denies numbness, tingling or new weaknesses Behavioral/Psych: Mood is stable, no new changes  Extremities: No lower extremity edema All other systems were reviewed with the patient and are negative.  I have reviewed the past medical history, past surgical history, social history and family history with the patient and they are unchanged from previous note.   PHYSICAL EXAMINATION:  Vitals:   08/11/18 2049 08/12/18 0507  BP: 116/78 121/74  Pulse: (!) 127 (!) 122  Resp: 20 19  Temp: 99.7 F (37.6 C) 98.7 F (37.1 C)  SpO2: 96% 94%   Filed Weights   07/27/18 1745 07/31/18 0300  Weight: 183 lb 10.3 oz (83.3 kg) 196 lb 3.4 oz (89 kg)    Intake/Output from previous day: 07/08 0701 - 07/09 0700 In: 430 [P.O.:330; IV Piggyback:100] Out: 490 [Urine:400; Emesis/NG output:90]  GENERAL:alert, no distress  SKIN: no rashes or significant lesions OROPHARYNX: Swollen lips. Mucus membranes dry. No thrush.  LUNGS: clear to auscultation and percussion with normal breathing effort HEART: Tachycardic. Regular rhythm.  ABDOMEN:abdomen soft, non-tender and normal bowel sounds NEURO: alert & oriented x 3 with fluent speech, no focal motor/sensory deficits  LABORATORY DATA:  I have reviewed the data as listed CMP Latest Ref Rng & Units 08/12/2018 08/11/2018 08/10/2018  Glucose 70 - 99 mg/dL 87 88 88  BUN 6 - 20 mg/dL _0 Creatinine 0.44 - 1.00 mg/dL 0.92 1.09(H) 1.06(H)  Sodium 135 -  145 mmol/L 143 141 141  Potassium 3.5 - 5.1 mmol/L 3.5 4.3 3.5  Chloride 98 - 111 mmol/L 118(H) 120(H) 118(H)  CO2 22 - 32 mmol/L 16(L) 18(L) 15(L)  Calcium 8.9 - 10.3 mg/dL 7.2(L) 7.2(L) 7.2(L)  Total Protein 6.5 - 8.1 g/dL - - -  Total Bilirubin 0.3 - 1.2 mg/dL - - -  Alkaline Phos 38 - 126 U/L - - -  AST 15 - 41 U/L - - -  ALT 0 - 44 U/L - - -    Lab Results  Component Value Date   WBC 1.5 (L) 08/12/2018   HGB 7.8 (L) 08/12/2018   HCT 26.2 (L) 08/12/2018   MCV 91.9 08/12/2018   PLT 121 (L) 08/12/2018   NEUTROABS 0.9 (L) 08/12/2018    Dg Chest 2 View  Result Date: 08/05/2018 CLINICAL DATA:  Fever of unknown origin, multiple sclerosis EXAM: CHEST - 2 VIEW COMPARISON:  07/27/2018 FINDINGS: Borderline enlargement of cardiac silhouette. Mediastinal contours and pulmonary vascularity normal. Bibasilar opacities question atelectasis versus infiltrate. Lungs otherwise clear. No pleural effusion or pneumothorax. Bones unremarkable. IMPRESSION: Bibasilar opacities question atelectasis versus infiltrate. Electronically Signed   By: Lavonia Dana M.D.   On: 08/05/2018 08:27   Dg Chest 2 View  Result Date: 07/27/2018 CLINICAL DATA:  Fever and malaise EXAM: CHEST - 2 VIEW COMPARISON:  July 16, 2018 FINDINGS: Lungs are clear. The heart size and pulmonary vascularity are normal. No adenopathy. No bone lesions. IMPRESSION: No edema or consolidation. Electronically Signed   By: Lowella Grip III M.D.   On: 07/27/2018 15:00   Ct Chest W Contrast  Result Date: 08/11/2018 CLINICAL DATA:  Fevers of unknown origin. EXAM: CT CHEST WITH CONTRAST TECHNIQUE: Multidetector CT imaging of the chest was performed during intravenous contrast administration. CONTRAST:  75 mL OMNIPAQUE IOHEXOL 300 MG/ML  SOLN COMPARISON:  CT chest, abdomen and pelvis 08/01/2018. PA and lateral chest 07/27/2010 and 08/05/2018 FINDINGS: Cardiovascular: Heart size is mildly enlarged. No pericardial effusion. No atherosclerotic  vascular disease is seen. Mediastinum/Nodes: Small bilateral axillary lymph nodes have clearly visible fatty hila consistent with benignity. No mediastinal or hilar lymphadenopathy by CT size criteria. Lungs/Pleura: Small bilateral pleural effusions are not notably changed. Dependent bilateral lower lobe airspace disease is improved since the prior examination. Residual opacities have an appearance most compatible with atelectasis. Scattered ground-glass opacity seen on the comparison CT have resolved. 0.3 cm right upper lobe nodule on image 26 of series 5 is unchanged. Upper Abdomen: Status post cholecystectomy.  No acute finding. Musculoskeletal: T11 hemivertebra noted. No acute or focal abnormality. IMPRESSION: Scattered ground-glass opacities seen on the comparison CT have resolved. Dependent lower lobe airspace disease bilaterally is improved. Residual opacities have an appearance most compatible with atelectasis rather than pneumonia. No change in small bilateral pleural effusions. No change in a 0.3 cm right upper lobe pulmonary nodule. No follow-up needed if patient is low-risk. Non-contrast chest CT can be considered in 12 months if patient is high-risk. This recommendation follows the consensus statement: Guidelines for Management of Incidental Pulmonary Nodules Detected on CT Images: From the Fleischner Society 2017; Radiology 2017; 284:[id number]. Cardiomegaly. Electronically Signed   By: Inge Rise M.D.   On: 08/11/2018 11:01   Ct Chest W Contrast  Result Date: 08/02/2018 CLINICAL DATA:  Fever of unknown origin. EXAM: CT CHEST, ABDOMEN, AND PELVIS WITH CONTRAST TECHNIQUE: Multidetector CT imaging of the chest, abdomen and pelvis was performed following the standard protocol during bolus administration of intravenous contrast. CONTRAST:  171m OMNIPAQUE IOHEXOL 300 MG/ML SOLN, 337mOMNIPAQUE IOHEXOL 300 MG/ML SOLN COMPARISON:  None. FINDINGS: CT CHEST FINDINGS Cardiovascular: There is no large  centrally located pulmonary embolus. Detection of smaller segmental and subsegmental pulmonary emboli is limited by contrast bolus timing. The main pulmonary artery is within normal limits for size. There is no CT evidence of acute right heart strain. The visualized aorta is normal. Heart size is normal. There is a trace pericardial effusion. Mediastinum/Nodes: --there are mildly prominent mediastinal and hilar lymph nodes bilaterally. --there is mild bilateral axillary adenopathy. --No supraclavicular lymphadenopathy. --Normal thyroid gland. --The esophagus is unremarkable Lungs/Pleura: There are trace bilateral pleural effusions with adjacent atelectasis. There is scattered ground-glass airspace opacities bilaterally, greatest within the right upper lobe. There is no pneumothorax. The trachea is unremarkable. Musculoskeletal: No chest wall abnormality. No acute or significant osseous findings. 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 CT ABDOMEN PELVIS FINDINGS Hepatobiliary: The liver is borderline enlarged. Status post cholecystectomy.There is no biliary ductal dilation. Pancreas: There is some mild peripancreatic fat stranding. Spleen: There is a small wedge-shaped defect in the lower pole the spleen measuring approximately 1.8 cm (axial series 2 image 47). Adrenals/Urinary Tract: --Adrenal glands: No adrenal hemorrhage. --Right kidney/ureter: No hydronephrosis or perinephric hematoma. --Left kidney/ureter: No hydronephrosis or perinephric hematoma. --Urinary bladder: Unremarkable. Stomach/Bowel: --Stomach/Duodenum: No hiatal hernia or other gastric abnormality. Normal duodenal course and caliber. --Small bowel: No dilatation or inflammation. --Colon: No focal abnormality. --Appendix: Normal. Vascular/Lymphatic: Normal course and caliber of the major abdominal vessels. There is a retroaortic left renal vein, a normal variant. --there  are prominent retroperitoneal lymph nodes. --there  are prominent mesenteric lymph nodes. There is haziness throughout the mesentery. There are enlarged right lower quadrant lymph nodes measuring approximately 1.1 cm in diameter. --there are mildly enlarged pelvic and inguinal lymph nodes. Reproductive: There is a small amount of free fluid in the pelvis which may be physiologic. There is no discrete ovarian mass. Other: There is a small volume of pelvic free fluid which is likely physiologic. No free air. There is some mild fat stranding involving the anterior abdominal wall bilaterally. Musculoskeletal. No acute displaced fractures. IMPRESSION: 1. Scattered ground-glass airspace opacities, greatest within the right upper lobe, concerning for multifocal pneumonia. 2. Small bilateral pleural effusions with adjacent atelectasis. 3. Mild diffuse adenopathy throughout the chest, abdomen, and pelvis of unknown clinical significance. 4. Peripancreatic free fluid with edema within the root of the mesentery. Findings are nonspecific and can be seen in patients with pancreatitis, versus less likely sclerosing mesenteritis or inflammatory bowel disease. There are some prominent lymph nodes right lower quadrant which can be seen in patients with mesenteric adenitis. 5. Normal appendix in the right lower quadrant. 6. Small volume free fluid in the pelvis favored to be physiologic. 7. Wedge-shaped hypoattenuating defect in the spleen of unknown clinical significance. Differential considerations include a splenic infarct versus less likely a splenic laceration, especially in the absence of a history of trauma. There is no perisplenic hematoma. Electronically Signed   By: Constance Holster M.D.   On: 08/02/2018 00:18   Mr Brain Wo Contrast  Result Date: 07/28/2018 CLINICAL DATA:  Encephalopathy with hallucinations EXAM: MRI HEAD WITHOUT CONTRAST TECHNIQUE: Multiplanar, multiecho pulse sequences of the brain and surrounding structures were obtained without intravenous  contrast. COMPARISON:  Brain MRI 03/24/2018 FINDINGS: BRAIN: There is no acute infarct, acute hemorrhage or extra-axial collection. The midline structures are normal. The white matter signal is normal for the patient's age. The cerebral and cerebellar volume are age-appropriate. No hydrocephalus. Susceptibility-sensitive sequences show no chronic microhemorrhage or superficial siderosis. No midline shift or other mass effect. VASCULAR: The major intracranial arterial and venous sinus flow voids are normal. SKULL AND UPPER CERVICAL SPINE: Calvarial bone marrow signal is normal. There is no skull base mass. Visualized upper cervical spine and soft tissues are normal. SINUSES/ORBITS: No fluid levels or advanced mucosal thickening. No mastoid or middle ear effusion. The orbits are normal. IMPRESSION: Normal brain. Electronically Signed   By: Ulyses Jarred M.D.   On: 07/28/2018 00:13   Mr Lumbar Spine Wo Contrast  Result Date: 08/02/2018 CLINICAL DATA:  Fever. Lower extremity weakness. History of multiple sclerosis. EXAM: MRI LUMBAR SPINE WITHOUT CONTRAST TECHNIQUE: Multiplanar, multisequence MR imaging of the lumbar spine was performed. No intravenous contrast was administered. COMPARISON:  CT abdomen pelvis from yesterday. FINDINGS: Despite efforts by the technologist and patient, motion artifact is present on today's exam and could not be eliminated. This reduces exam sensitivity and specificity. Segmentation:  Standard. Alignment:  Physiologic. Vertebrae:  No fracture, evidence of discitis, or bone lesion. Conus medullaris and cauda equina: Conus extends to the L1-L2 level. Conus and cauda equina appear normal. Paraspinal and other soft tissues: Symmetric bilateral paraspinous muscle edema. Otherwise negative. Disc levels: No significant disc bulge or herniation. No spinal canal or neuroforaminal stenosis. IMPRESSION: 1.  No acute abnormality or significant degenerative changes. Electronically Signed   By:  Titus Dubin M.D.   On: 08/02/2018 09:14   Ct Abdomen Pelvis W Contrast  Result Date: 08/02/2018 CLINICAL DATA:  Fever  of unknown origin. EXAM: CT CHEST, ABDOMEN, AND PELVIS WITH CONTRAST TECHNIQUE: Multidetector CT imaging of the chest, abdomen and pelvis was performed following the standard protocol during bolus administration of intravenous contrast. CONTRAST:  158m OMNIPAQUE IOHEXOL 300 MG/ML SOLN, 319mOMNIPAQUE IOHEXOL 300 MG/ML SOLN COMPARISON:  None. FINDINGS: CT CHEST FINDINGS Cardiovascular: There is no large centrally located pulmonary embolus. Detection of smaller segmental and subsegmental pulmonary emboli is limited by contrast bolus timing. The main pulmonary artery is within normal limits for size. There is no CT evidence of acute right heart strain. The visualized aorta is normal. Heart size is normal. There is a trace pericardial effusion. Mediastinum/Nodes: --there are mildly prominent mediastinal and hilar lymph nodes bilaterally. --there is mild bilateral axillary adenopathy. --No supraclavicular lymphadenopathy. --Normal thyroid gland. --The esophagus is unremarkable Lungs/Pleura: There are trace bilateral pleural effusions with adjacent atelectasis. There is scattered ground-glass airspace opacities bilaterally, greatest within the right upper lobe. There is no pneumothorax. The trachea is unremarkable. Musculoskeletal: No chest wall abnormality. No acute or significant osseous findings. 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 CT ABDOMEN PELVIS FINDINGS Hepatobiliary: The liver is borderline enlarged. Status post cholecystectomy.There is no biliary ductal dilation. Pancreas: There is some mild peripancreatic fat stranding. Spleen: There is a small wedge-shaped defect in the lower pole the spleen measuring approximately 1.8 cm (axial series 2 image 47). Adrenals/Urinary Tract: --Adrenal glands: No adrenal hemorrhage. --Right kidney/ureter: No hydronephrosis  or perinephric hematoma. --Left kidney/ureter: No hydronephrosis or perinephric hematoma. --Urinary bladder: Unremarkable. Stomach/Bowel: --Stomach/Duodenum: No hiatal hernia or other gastric abnormality. Normal duodenal course and caliber. --Small bowel: No dilatation or inflammation. --Colon: No focal abnormality. --Appendix: Normal. Vascular/Lymphatic: Normal course and caliber of the major abdominal vessels. There is a retroaortic left renal vein, a normal variant. --there are prominent retroperitoneal lymph nodes. --there are prominent mesenteric lymph nodes. There is haziness throughout the mesentery. There are enlarged right lower quadrant lymph nodes measuring approximately 1.1 cm in diameter. --there are mildly enlarged pelvic and inguinal lymph nodes. Reproductive: There is a small amount of free fluid in the pelvis which may be physiologic. There is no discrete ovarian mass. Other: There is a small volume of pelvic free fluid which is likely physiologic. No free air. There is some mild fat stranding involving the anterior abdominal wall bilaterally. Musculoskeletal. No acute displaced fractures. IMPRESSION: 1. Scattered ground-glass airspace opacities, greatest within the right upper lobe, concerning for multifocal pneumonia. 2. Small bilateral pleural effusions with adjacent atelectasis. 3. Mild diffuse adenopathy throughout the chest, abdomen, and pelvis of unknown clinical significance. 4. Peripancreatic free fluid with edema within the root of the mesentery. Findings are nonspecific and can be seen in patients with pancreatitis, versus less likely sclerosing mesenteritis or inflammatory bowel disease. There are some prominent lymph nodes right lower quadrant which can be seen in patients with mesenteric adenitis. 5. Normal appendix in the right lower quadrant. 6. Small volume free fluid in the pelvis favored to be physiologic. 7. Wedge-shaped hypoattenuating defect in the spleen of unknown clinical  significance. Differential considerations include a splenic infarct versus less likely a splenic laceration, especially in the absence of a history of trauma. There is no perisplenic hematoma. Electronically Signed   By: ChConstance Holster.D.   On: 08/02/2018 00:18   Ct Maxillofacial W Contrast  Result Date: 08/08/2018 CLINICAL DATA:  Fevers.  Right mandibular molar tooth pain. EXAM: CT MAXILLOFACIAL WITH CONTRAST TECHNIQUE: Multidetector CT imaging of the maxillofacial  structures was performed with intravenous contrast. Multiplanar CT image reconstructions were also generated. CONTRAST:  48m OMNIPAQUE IOHEXOL 300 MG/ML  SOLN COMPARISON:  MRI 07/27/2018 FINDINGS: Osseous: No maxillary, mid face or upper face abnormality is seen. There is no evidence of active dental or periodontal disease. With respect to the right mandible, teeth 25 through 31 appear normal. Tooth 32 is absent and presumably has been extracted in the distant past. There is a benign appearing focus of hazy sclerosis measuring about 1.5 cm at the angle of the mandible on the right that could represent sequela of the previous extraction. If the patient has not had extraction of tooth 32, then this could be some dysplastic change related to aplasia of the tooth. I do not see any likely significant finding in this region. Left mandibular molar remains in place, not fully erupted. Orbits: Normal Sinuses: Clear Soft tissues: Normal. No soft tissue inflammatory changes are evident. Limited intracranial: Normal IMPRESSION: No evidence of dental or periodontal decay or inflammatory disease. Absent right mandibular molar tooth. Presumably there has been distant extraction. Hazy sclerosis within the mandible at the angle of the mandible could be chronic healed sequela of the molar tooth extraction. If there is not a history of molar tooth extraction, then there could be aplasia/hypoplasia of tooth 32. Electronically Signed   By: MNelson ChimesM.D.   On:  08/08/2018 11:49   Dg Chest Port 1 View  Result Date: 07/16/2018 CLINICAL DATA:  Fatigue EXAM: PORTABLE CHEST 1 VIEW COMPARISON:  None. FINDINGS: The heart size and mediastinal contours are within normal limits. Both lungs are clear. The visualized skeletal structures are unremarkable. IMPRESSION: No active disease. Electronically Signed   By: MInez CatalinaM.D.   On: 07/16/2018 15:18   Vas [place] Extremity Venous (dvt)  Result Date: 08/06/2018  Lower Venous Study Indications: Immobility, fever.  Limitations: Patient positioning. Comparison Study: No prior Performing Technologist: MAbram SanderRVS  Examination Guidelines: A complete evaluation includes B-mode imaging, spectral Doppler, color Doppler, and power Doppler as needed of all accessible portions of each vessel. Bilateral testing is considered an integral part of a complete examination. Limited examinations for reoccurring indications may be performed as noted.  +---------+---------------+---------+-----------+----------+--------------+ RIGHT    CompressibilityPhasicitySpontaneityPropertiesSummary        +---------+---------------+---------+-----------+----------+--------------+ CFV      Full           Yes      Yes                                 +---------+---------------+---------+-----------+----------+--------------+ SFJ      Full                                                        +---------+---------------+---------+-----------+----------+--------------+ FV Prox  Full                                                        +---------+---------------+---------+-----------+----------+--------------+ FV Mid   Full                                                        +---------+---------------+---------+-----------+----------+--------------+  FV DistalFull                                                        +---------+---------------+---------+-----------+----------+--------------+ PFV      Full                                                         +---------+---------------+---------+-----------+----------+--------------+ POP      Full           Yes      Yes                                 +---------+---------------+---------+-----------+----------+--------------+ PTV      Full                                                        +---------+---------------+---------+-----------+----------+--------------+ PERO                                                  Not visualized +---------+---------------+---------+-----------+----------+--------------+   +---------+---------------+---------+-----------+----------+--------------+ LEFT     CompressibilityPhasicitySpontaneityPropertiesSummary        +---------+---------------+---------+-----------+----------+--------------+ CFV      Full           Yes      Yes                                 +---------+---------------+---------+-----------+----------+--------------+ SFJ      Full                                                        +---------+---------------+---------+-----------+----------+--------------+ FV Prox  Full                                                        +---------+---------------+---------+-----------+----------+--------------+ FV Mid   Full                                                        +---------+---------------+---------+-----------+----------+--------------+ FV DistalFull                                                        +---------+---------------+---------+-----------+----------+--------------+  PFV      Full                                                        +---------+---------------+---------+-----------+----------+--------------+ POP      Full           Yes      Yes                                 +---------+---------------+---------+-----------+----------+--------------+ PTV      Full                                                         +---------+---------------+---------+-----------+----------+--------------+ PERO                                                  Not visualized +---------+---------------+---------+-----------+----------+--------------+     Summary: Right: There is no evidence of deep vein thrombosis in the lower extremity. No cystic structure found in the popliteal fossa. Left: There is no evidence of deep vein thrombosis in the lower extremity. No cystic structure found in the popliteal fossa.  *See table(s) above for measurements and observations. Electronically signed by Servando Snare MD on 08/06/2018 at 11:00:59 AM.    Final     ASSESSMENT AND PLAN: 1. Pancytopenia -characterized by leukopenia, normocytic anemia, and mild, intermittent thrombocytopenia. 2.  Multiple sclerosis 3.  Positive ANA 4.  Fever 5.  Tachycardia  Emily Hicks appears unchanged. CBC is stable. Labs do not show evidence of iron deficiency, Vitamin B12 deficiency, or folate deficiency. LDH is elevated, Reticulocytes not elevated, Haptoglobin was normal and SPEP did not show an M spike.  Repeat ANA was positive, 1: 1280. Suspect that pancytopenia is a result of her acute systemic infection, etiology of which is unclear.  Repeat CT scan of the chest showed resolution of the groundglass opacities and unchanged 0.3 cm right upper lobe pulmonary nodule.  1.  Her CBC has remained stable.  No need for daily CBC. 2.  Again discussed a bone marrow biopsy with the patient for further work-up of her pancytopenia.  She again continues to refuse this test. 3. Recommend Rheumatology consult for elevated ANA. 4. May need to consider transfer to [facility] for additional work-up.    LOS: 16 days   Mikey Bussing, DNP, AGPCNP-BC, AOCNP 08/12/18 Ms. Martinez appears unchanged.  She is stable from a hematologic standpoint.  She continues to have intermittent fever.  The repeat chest CT reveals improvement in the lung infiltrates.  I suspect her clinical  presentation is related to a resolving infection, or collagen vascular disease.  The ANA is positive.  The leukopenia may be related to bone marrow suppression from infection or autoimmune disease.  A hematopoietic malignancy is unlikely, but remains in the differential diagnosis.  I discussed the differential diagnosis with her.  I recommend a diagnostic bone marrow biopsy.  She declines a bone marrow biopsy.  I recommend rheumatology  consultation.

## 2018-08-12 NOTE — Progress Notes (Signed)
Occupational Therapy Treatment Patient Details Name: Emily Hicks MRN: [id number] DOB: 01-18-1986 Today's Date: 08/12/2018    History of present illness This 33 year old female was admitted for progressive weakness and AMS.  She was recently dxd with MS   OT comments  Pt always agreeable to therapy. Having a much harder time today, slower to respond and much more assistance to stand than usual.  HR in 130s. She did smile a couple of times today--has had very flat affect lately, so this was a nice improvement.   Follow Up Recommendations  CIR    Equipment Recommendations  3 in 1 bedside commode    Recommendations for Other Services      Precautions / Restrictions Precautions Precautions: Fall Precaution Comments: monitor HR Restrictions Weight Bearing Restrictions: No       Mobility Bed Mobility Overal bed mobility: Needs Assistance Bed Mobility: Supine to Sit Rolling: Mod assist   Supine to sit: Mod assist     General bed mobility comments: Mod Assist for upper body supine to sit and increased time  Transfers Overall transfer level: Needs assistance Equipment used: Rolling walker (2 wheeled) Transfers: Sit to/from Stand Sit to Stand: Mod assist        General transfer comment: mod A to rise and stabilize. Needed cues to keep hands on walker to sidestep up HOB.  Pt very unstable without RW    Balance     Sitting balance-Leahy Scale: Fair       Standing balance-Leahy Scale: Poor                             ADL either performed or assessed with clinical judgement   ADL       Grooming: Set up;Sitting(carmex)                                 General ADL Comments: Pt agreeable to OOB, walk to sink. Everything was effortful and she needed more assistance today.  Sat for grooming, stood to [facility]estep up [facility] and returned to supine She did not feel like using AE that was introduced on last visit.  ADL was completed earlier today     Vision       Perception     Praxis      Cognition Arousal/Alertness: Awake/alert Behavior During Therapy: WFL for tasks assessed/performed                                   General Comments: slow to respond today, more facial expression/smiled a couple of times        Exercises     Shoulder Instructions       General Comments HR 130-138    Pertinent Vitals/ Pain       Pain Assessment: No/denies pain Faces Pain Scale: Hurts a little bit Pain Location: tooth Pain Descriptors / Indicators: Constant  Home Living                                          Prior Functioning/Environment              Frequency  Min 2X/week        Progress Toward Goals  OT Goals(current goals can now be found in the care plan section)  Progress towards OT goals: Progressing toward goals(slow progress; not doing as well today)     Plan      Co-evaluation                 AM-PAC OT "6 Clicks" Daily Activity     Outcome Measure   Help from another person eating meals?: A Little Help from another person taking care of personal grooming?: A Little Help from another person toileting, which includes using toliet, bedpan, or urinal?: Total Help from another person bathing (including washing, rinsing, drying)?: A Lot Help from another person to put on and taking off regular upper body clothing?: A Lot Help from another person to put on and taking off regular lower body clothing?: A Lot 6 Click Score: 13    End of Session    OT Visit Diagnosis: Muscle weakness (generalized) (M62.81)   Activity Tolerance Patient limited by fatigue   Patient Left in bed;with call bell/phone within reach;with bed alarm set   Nurse Communication          Time: 7591-6384 OT Time Calculation (min): 25 min  Charges: OT General Charges $OT Visit: 1 Visit OT Treatments $Therapeutic Activity: 8-22 mins  Lesle Chris, OTR/L Acute  Rehabilitation Services [phone] [facility] pager [phone] office 08/12/2018   Izaha Shughart 08/12/2018, 4:25 PM

## 2018-08-13 DIAGNOSIS — F329 Major depressive disorder, single episode, unspecified: Secondary | ICD-10-CM

## 2018-08-13 DIAGNOSIS — F32A Depression, unspecified: Secondary | ICD-10-CM

## 2018-08-13 DIAGNOSIS — D696 Thrombocytopenia, unspecified: Secondary | ICD-10-CM

## 2018-08-13 DIAGNOSIS — R4182 Altered mental status, unspecified: Secondary | ICD-10-CM

## 2018-08-13 LAB — RENAL FUNCTION PANEL
Albumin: 1.9 g/dL — ABNORMAL LOW (ref 3.5–5.0)
Anion gap: 7 (ref 5–15)
BUN: 12 mg/dL (ref 6–20)
CO2: 16 mmol/L — ABNORMAL LOW (ref 22–32)
Calcium: 7.3 mg/dL — ABNORMAL LOW (ref 8.9–10.3)
Chloride: 123 mmol/L — ABNORMAL HIGH (ref 98–111)
Creatinine, Ser: 0.92 mg/dL (ref 0.44–1.00)
GFR calc Af Amer: >60 mL/min (ref >60)
GFR calc non Af Amer: >60 mL/min (ref >60)
Glucose, Bld: 90 mg/dL (ref 70–99)
Phosphorus: 1.5 mg/dL — ABNORMAL LOW (ref 2.5–4.6)
Potassium: 3.9 mmol/L (ref 3.5–5.1)
Sodium: 146 mmol/L — ABNORMAL HIGH (ref 135–145)

## 2018-08-13 LAB — ENA+DNA/DS+ANTICH+CENTRO+JO...
Anti JO-1: <0.2 AI (ref 0.0–0.9)
Centromere Ab Screen: <0.2 AI (ref 0.0–0.9)
Chromatin Ab SerPl-aCnc: >8 AI — ABNORMAL HIGH (ref 0.0–0.9)
ENA SM Ab Ser-aCnc: >8 AI — ABNORMAL HIGH (ref 0.0–0.9)
Ribonucleic Protein: >8 AI — ABNORMAL HIGH (ref 0.0–0.9)
SSA (Ro) (ENA) Antibody, IgG: 5.2 AI — ABNORMAL HIGH (ref 0.0–0.9)
SSB (La) (ENA) Antibody, IgG: <0.2 AI (ref 0.0–0.9)
Scleroderma (Scl-70) (ENA) Antibody, IgG: <0.2 AI (ref 0.0–0.9)
ds DNA Ab: 261 IU/mL — ABNORMAL HIGH (ref 0–9)

## 2018-08-13 LAB — ANA W/REFLEX IF POSITIVE: Anti Nuclear Antibody (ANA): POSITIVE — AB

## 2018-08-13 LAB — DIFFERENTIAL
Basophils Absolute: 0 10*3/uL (ref 0.0–0.1)
Basophils Relative: 1 %
Eosinophils Absolute: 0 10*3/uL (ref 0.0–0.5)
Eosinophils Relative: 0 %
Lymphocytes Relative: 26 %
Lymphs Abs: 0.4 10*3/uL — ABNORMAL LOW (ref 0.7–4.0)
Monocytes Absolute: 0.1 10*3/uL (ref 0.1–1.0)
Monocytes Relative: 10 %
Neutro Abs: 0.9 10*3/uL — ABNORMAL LOW (ref 1.7–7.7)
Neutrophils Relative %: 58 %

## 2018-08-13 LAB — CBC
HCT: 25.7 % — ABNORMAL LOW (ref 36.0–46.0)
Hemoglobin: 7.8 g/dL — ABNORMAL LOW (ref 12.0–15.0)
MCH: 28.3 pg (ref 26.0–34.0)
MCHC: 30.4 g/dL (ref 30.0–36.0)
MCV: 93.1 fL (ref 80.0–100.0)
Platelets: 115 10*3/uL — ABNORMAL LOW (ref 150–400)
RBC: 2.76 MIL/uL — ABNORMAL LOW (ref 3.87–5.11)
RDW: 20.3 % — ABNORMAL HIGH (ref 11.5–15.5)
WBC: 1.5 10*3/uL — ABNORMAL LOW (ref 4.0–10.5)
nRBC: 0 % (ref 0.0–0.2)

## 2018-08-13 LAB — PROTEIN / CREATININE RATIO, URINE
Creatinine, Urine: 181.97 mg/dL
Total Protein, Urine: <6 mg/dL

## 2018-08-13 LAB — C3 COMPLEMENT: C3 Complement: 51 mg/dL — ABNORMAL LOW (ref 82–167)

## 2018-08-13 LAB — LACTATE DEHYDROGENASE: LDH: 527 U/L — ABNORMAL HIGH (ref 98–192)

## 2018-08-13 LAB — C4 COMPLEMENT: Complement C4, Body Fluid: 8 mg/dL — ABNORMAL LOW (ref 14–44)

## 2018-08-13 MED ORDER — NYSTATIN 100000 UNIT/ML MT SUSP
5.0000 mL | Freq: Four times a day (QID) | OROMUCOSAL | Status: DC
  Administered 2018-08-13 – 2018-08-16 (×11): 500000 [IU] via ORAL
  Filled 2018-08-13 (×11): qty 5

## 2018-08-13 MED ORDER — FLUCONAZOLE 100 MG PO TABS
100.0000 mg | ORAL_TABLET | Freq: Every day | ORAL | Status: DC
  Administered 2018-08-13 – 2018-08-16 (×4): 100 mg via ORAL
  Filled 2018-08-13 (×4): qty 1

## 2018-08-13 MED ORDER — DULOXETINE HCL 60 MG PO CPEP
90.0000 mg | ORAL_CAPSULE | Freq: Every day | ORAL | Status: DC
  Administered 2018-08-14 – 2018-08-16 (×3): 90 mg via ORAL
  Filled 2018-08-13 (×3): qty 1

## 2018-08-13 MED ORDER — TRAZODONE HCL 50 MG PO TABS
50.0000 mg | ORAL_TABLET | Freq: Every day | ORAL | Status: DC
  Administered 2018-08-13 – 2018-08-15 (×3): 50 mg via ORAL
  Filled 2018-08-13 (×3): qty 1

## 2018-08-13 NOTE — Consult Note (Addendum)
Telepsych Consultation   Reason for Consult:  Depression Referring Physician:  Dr. Calvert Cantor Location of Patient: [facility] Location of Provider: [facility]  Patient Identification: Emily Hicks MRN:  [id number] Principal Diagnosis: Anxiety and depression Diagnosis:  Principal Problem:   Severe sepsis (HCC) Active Problems:   MS (multiple sclerosis) (HCC)   Tachycardia   Leukopenia   AKI (acute kidney injury) (HCC)   Hypernatremia   Fever   AMS (altered mental status)   Acute metabolic encephalopathy   Hypokalemia   Candida esophagitis (HCC)   Hypomagnesemia   Normocytic anemia   Pressure injury of skin   Elevated liver enzymes   FUO (fever of unknown origin)   Total Time spent with patient: 1 hour  Subjective:   Emily Hicks is a 33 y.o. female patient admitted with altered mental status.   HPI:   Per chart review, patient was admitted with altered mental status in the setting of fever and generalized weakness and found to have sepsis of unknown etiology. She has a history of multiple sclerosis. Prior workup in February suggested a diagnosis of SLE. Primary team has attempted to transfer patient to a tertiary care center but there is no availability at this time. Home medications include Cymbalta 60 mg daily and Remeron 15 mg qhs.   On interview, Emily Hicks reports that she has been feeling depressed since April after being diagnosed with multiple sclerosis. She reports anhedonia. She denies SI or suicide attempts. She reports that her medications were started in May. She does not feel like they have helped with her mood. She also reports generalized worries. She denies HI or AVH. She reports problems with falling asleep and maintaining sleep. She reports poor appetite with a 15 pound weight loss since April. She denies a history of manic symptoms (decreased need for sleep, increased energy, pressured speech or euphoria).   Past Psychiatric History: Depression    Risk to Self:  None. Denies SI.  Risk to Others:  None. Denies HI.  Prior Inpatient Therapy:  Denies  Prior Outpatient Therapy:  She is followed at [facility].   Past Medical History:  Past Medical History:  Diagnosis Date  . Gallstones   . GERD (gastroesophageal reflux disease)    only prn OTC occasionally  . Headache   . Multiple sclerosis (HCC)   . Numbness and tingling of left leg   . Numbness and tingling of right leg     Past Surgical History:  Procedure Laterality Date  . CHOLECYSTECTOMY N/A 12/28/2013   Procedure: LAPAROSCOPIC CHOLECYSTECTOMY;  Surgeon: Claud Kelp, MD;  Location: [facility];  Service: General;  Laterality: N/A;   Family History:  Family History  Problem Relation Age of Onset  . Hypertension Father   . Hypertension Mother   . Hypertension Sister   . Diabetes Maternal Grandmother   . Diabetes Maternal Grandfather    Family Psychiatric  History: Denies Social History:  Social History   Substance and Sexual Activity  Alcohol Use No     Social History   Substance and Sexual Activity  Drug Use No    Social History   Socioeconomic History  . Marital status: Married    Spouse name: Not on file  . Number of children: 1  . Years of education: 32  . Highest education level: Bachelor's degree (e.g., BA, AB, BS)  Occupational History  . Occupation: Audiological scientist  Social Needs  . Financial resource strain: Not on file  . Food  insecurity    Worry: Not on file    Inability: Not on file  . Transportation needs    Medical: Not on file    Non-medical: Not on file  Tobacco Use  . Smoking status: Never Smoker  . Smokeless tobacco: Never Used  Substance and Sexual Activity  . Alcohol use: No  . Drug use: No  . Sexual activity: Not Currently  Lifestyle  . Physical activity    Days per week: Not on file    Minutes per session: Not on file  . Stress: Not on file  Relationships  . Social Musicianconnections    Talks on phone: Not on file     Gets together: Not on file    Attends religious service: Not on file    Active member of club or organization: Not on file    Attends meetings of clubs or organizations: Not on file    Relationship status: Not on file  Other Topics Concern  . Not on file  Social History Narrative   Lives at home with husband and son.   Right-handed.   No caffeine use.   Additional Social History: She lives with her husband of 3 years and 758 y/o son. She is an Airline pilotaccountant. She denies alcohol or illicit substance use.     Allergies:  No Known Allergies  Labs:  Results for orders placed or performed during the hospital encounter of 07/27/18 (from the past 48 hour(s))  Culture, blood (routine x 2)     Status: None (Preliminary result)   Collection Time: 08/11/18  2:59 PM   Specimen: BLOOD  Result Value Ref Range   Specimen Description      BLOOD LEFT ANTECUBITAL Performed at [facility], [address] [place], [place] [address]    Special Requests      BOTTLES DRAWN AEROBIC ONLY Blood Culture adequate volume Performed at [facility], [address] [place], [place] [address]    Culture      NO GROWTH 2 DAYS Performed at [facility], [address] [place], [place] [address]    Report Status PENDING   Culture, blood (routine x 2)     Status: None (Preliminary result)   Collection Time: 08/11/18  2:59 PM   Specimen: BLOOD LEFT HAND  Result Value Ref Range   Specimen Description      BLOOD LEFT HAND Performed at [facility], [address] [place], [place] [address]    Special Requests      BOTTLES DRAWN AEROBIC ONLY Blood Culture adequate volume Performed at [facility], [address] [place], [place] [address]    Culture      NO GROWTH 2 DAYS Performed at [facility], [address] [place], [place] [address]    Report Status PENDING   Basic metabolic panel     Status: Abnormal    Collection Time: 08/12/18 10:26 AM  Result Value Ref Range   Sodium 143 135 - 145 mmol/L   Potassium 3.5 3.5 - 5.1 mmol/L    Comment: DELTA CHECK NOTED REPEATED TO VERIFY    Chloride 118 (H) 98 - 111 mmol/L   CO2 16 (L) 22 - 32 mmol/L   Glucose, Bld 87 70 - 99 mg/dL   BUN 12 6 - 20 mg/dL   Creatinine, Ser 4.030.92 0.44 - 1.00 mg/dL   Calcium 7.2 (L) 8.9 - 10.3 mg/dL   GFR calc  non Af Amer >60 >60 mL/min   GFR calc Af Amer >60 >60 mL/min   Anion gap 9 5 - 15    Comment: Performed at [facility], [address] [place], [place] [address]  Magnesium     Status: Abnormal   Collection Time: 08/12/18 10:26 AM  Result Value Ref Range   Magnesium 1.4 (L) 1.7 - 2.4 mg/dL    Comment: Performed at [facility], [address] [place], [place] [address]  Phosphorus     Status: Abnormal   Collection Time: 08/12/18 10:26 AM  Result Value Ref Range   Phosphorus 1.7 (L) 2.5 - 4.6 mg/dL    Comment: Performed at [facility], [address] [place], [place] [address]  CBC with Differential     Status: Abnormal   Collection Time: 08/12/18 10:26 AM  Result Value Ref Range   WBC 1.5 (L) 4.0 - 10.5 K/uL   RBC 2.85 (L) 3.87 - 5.11 MIL/uL   Hemoglobin 7.8 (L) 12.0 - 15.0 g/dL   HCT 91.426.2 (L) 78.236.0 - 95.646.0 %   MCV 91.9 80.0 - 100.0 fL   MCH 27.4 26.0 - 34.0 pg   MCHC 29.8 (L) 30.0 - 36.0 g/dL   RDW 21.319.9 (H) 08.611.5 - 57.815.5 %   Platelets 121 (L) 150 - 400 K/uL   nRBC 0.0 0.0 - 0.2 %   Neutrophils Relative % 64 %   Neutro Abs 0.9 (L) 1.7 - 7.7 K/uL   Lymphocytes Relative 22 %   Lymphs Abs 0.3 (L) 0.7 - 4.0 K/uL   Monocytes Relative 10 %   Monocytes Absolute 0.2 0.1 - 1.0 K/uL   Eosinophils Relative 0 %   Eosinophils Absolute 0.0 0.0 - 0.5 K/uL   Basophils Relative 1 %   Basophils Absolute 0.0 0.0 - 0.1 K/uL   WBC Morphology MILD LEFT SHIFT (1-5% METAS, OCC MYELO, OCC BANDS)    Immature Granulocytes 3 %   Abs Immature Granulocytes 0.05 0.00 -  0.07 K/uL    Comment: Performed at [facility], [address] [place], [place] [address]  Protein / creatinine ratio, urine     Status: None   Collection Time: 08/12/18  4:01 PM  Result Value Ref Range   Creatinine, Urine 181.97 mg/dL   Total Protein, Urine <6 mg/dL   Protein Creatinine Ratio        0.00 - 0.15 mg/mg[Cre]    Comment: RESULT BELOW REPORTABLE RANGE, UNABLE TO CALCULATE. Performed at [facility], [address] [place], [place] [address]   C3 complement     Status: Abnormal   Collection Time: 08/12/18  4:20 PM  Result Value Ref Range   C3 Complement 51 (L) 82 - 167 mg/dL    Comment: (NOTE) Performed At: [facility] [address] [place], [place] [address] Jolene SchimkeNagendra Sanjai MD VO:[id number]:[phone]   C4 complement     Status: Abnormal   Collection Time: 08/12/18  4:20 PM  Result Value Ref Range   Complement C4, Body Fluid 8 (L) 14 - 44 mg/dL    Comment: (NOTE) Performed At: [facility] [address] [place], [place] [id number] Jolene SchimkeNagendra Sanjai MD FI:[id number]:[phone]   Renal function panel     Status: Abnormal   Collection Time: 08/13/18  3:54 AM  Result Value Ref Range   Sodium 146 (H) 135 - 145 mmol/L   Potassium 3.9 3.5 - 5.1 mmol/L   Chloride 123 (H) 98 -  111 mmol/L   CO2 16 (L) 22 - 32 mmol/L   Glucose, Bld 90 70 - 99 mg/dL   BUN 12 6 - 20 mg/dL   Creatinine, Ser 1.610.92 0.44 - 1.00 mg/dL   Calcium 7.3 (L) 8.9 - 10.3 mg/dL   Phosphorus 1.5 (L) 2.5 - 4.6 mg/dL   Albumin 1.9 (L) 3.5 - 5.0 g/dL   GFR calc non Af Amer >60 >60 mL/min   GFR calc Af Amer >60 >60 mL/min   Anion gap 7 5 - 15    Comment: Performed at [facility], [address] [place], [place] [address]  Lactate dehydrogenase     Status: Abnormal   Collection Time: 08/13/18  3:54 AM  Result Value Ref Range   LDH 527 (H) 98 - 192 U/L    Comment: Performed at [facility], [address] [place], [place]  [address]    Medications:  Current Facility-Administered Medications  Medication Dose Route Frequency Provider Last Rate Last Dose  . acetaminophen (TYLENOL) tablet 650 mg  650 mg Oral Q6H PRN Rai, Ripudeep K, MD   650 mg at 08/11/18 1334  . benzocaine (ORAJEL) 10 % mucosal gel   Mouth/Throat QID PRN Rai, Ripudeep K, MD      . diphenhydrAMINE (BENADRYL) injection 25 mg  25 mg Intravenous Q6H PRN Calvert Cantorizwan, Saima, MD   25 mg at 08/11/18 1339  . DULoxetine (CYMBALTA) DR capsule 60 mg  60 mg Oral Daily Calvert Cantorizwan, Saima, MD   60 mg at 08/13/18 0925  . enoxaparin (LOVENOX) injection 40 mg  40 mg Subcutaneous Q24H Rizwan, Ladell HeadsSaima, MD   40 mg at 08/12/18 2227  . famotidine (PEPCID) IVPB 20 mg premix  20 mg Intravenous Q12H Calvert Cantorizwan, Saima, MD   Stopped at 08/12/18 2309  . feeding supplement (BOOST / RESOURCE BREEZE) liquid 1 Container  1 Container Oral Q24H Rai, Delene Ruffiniipudeep K, MD   1 Container at 08/13/18 [phone]  . feeding supplement (ENSURE ENLIVE) (ENSURE ENLIVE) liquid 237 mL  237 mL Oral Q24H Rai, Ripudeep K, MD   237 mL at 08/11/18 2036  . feeding supplement (PRO-STAT SUGAR FREE 64) liquid 30 mL  30 mL Oral BID Rai, Ripudeep K, MD   30 mL at 08/13/18 0924  . fluconazole (DIFLUCAN) tablet 100 mg  100 mg Oral Daily Rizwan, Ladell HeadsSaima, MD      . folic acid (FOLVITE) tablet 2 mg  2 mg Oral Daily Rai, Ripudeep K, MD   2 mg at 08/13/18 [id number]  . free water 300 mL  300 mL Oral Q8H Rai, Ripudeep K, MD   300 mL at 08/11/18 2120  . hydrocortisone (ANUSOL-HC) suppository 25 mg  25 mg Rectal BID Rai, Ripudeep K, MD      . lip balm (CARMEX) ointment 1 application  1 application Topical PRN Rai, Ripudeep K, MD      . loperamide (IMODIUM) capsule 2 mg  2 mg Oral Q8H PRN Rai, Ripudeep K, MD      . magnesium oxide (MAG-OX) tablet 400 mg  400 mg Oral BID Rai, Ripudeep K, MD   400 mg at 08/13/18 0924  . metoprolol tartrate (LOPRESSOR) injection 2.5 mg  2.5 mg Intravenous Q4H PRN Rai, Ripudeep K, MD   2.5 mg at 08/13/18 0922  . metoprolol  tartrate (LOPRESSOR) tablet 25 mg  25 mg Oral BID Rai, Ripudeep K, MD   25 mg at 08/13/18 0924  . mirtazapine (REMERON) tablet 15 mg  15 mg Oral QHS Debbe Odea, MD   15 mg at 08/12/18 2222  . multivitamin with minerals tablet 1 tablet  1 tablet Oral Daily Rai, Ripudeep K, MD   1 tablet at 08/13/18 0925  . nystatin (MYCOSTATIN) 100000 UNIT/ML suspension 500,000 Units  5 mL Oral QID Debbe Odea, MD      . ondansetron (ZOFRAN) injection 4 mg  4 mg Intravenous Q6H PRN Rai, Ripudeep K, MD   4 mg at 08/11/18 2117  . phosphorus (K PHOS NEUTRAL) tablet 500 mg  500 mg Oral TID Rai, Ripudeep K, MD   500 mg at 08/13/18 0937  . sodium chloride flush (NS) 0.9 % injection 10-40 mL  10-40 mL Intracatheter PRN Rai, Ripudeep K, MD      . sodium chloride flush (NS) 0.9 % injection 3 mL  3 mL Intravenous Once Rai, Ripudeep K, MD       Facility-Administered Medications Ordered in Other Encounters  Medication Dose Route Frequency Provider Last Rate Last Dose  . gadopentetate dimeglumine (MAGNEVIST) injection 20 mL  20 mL Intravenous Once PRN Marcial Pacas, MD        Musculoskeletal: Strength & Muscle Tone: No atrophy noted. Gait & Station: UTA since patient is lying in bed. Patient leans: N/A  Psychiatric Specialty Exam: Physical Exam  Nursing note and vitals reviewed. Constitutional: She is oriented to person, place, and time. She appears well-developed and well-nourished.  HENT:  Head: Normocephalic and atraumatic.  Neck: Normal range of motion.  Respiratory: Effort normal.  Musculoskeletal: Normal range of motion.  Neurological: She is alert and oriented to person, place, and time.  Psychiatric: Her speech is normal and behavior is normal. Judgment and thought content normal. Cognition and memory are normal. She exhibits a depressed mood.    Review of Systems  Constitutional: Negative for chills and fever.  Cardiovascular: Negative for chest pain.  Gastrointestinal: Positive for constipation.  Negative for abdominal pain, diarrhea, nausea and vomiting.  Psychiatric/Behavioral: Positive for depression. Negative for hallucinations, substance abuse and suicidal ideas. The patient is nervous/anxious and has insomnia.   All other systems reviewed and are negative.   Blood pressure 125/81, pulse (!) 126, temperature 100.1 F (37.8 C), temperature source Oral, resp. rate 18, height 5\' 1"  (1.549 m), weight 89 kg, SpO2 94 %.Body mass index is 37.07 kg/m.  General Appearance: Fairly Groomed, obese, young, African American female, wearing a hospital gown with angioedema who is lying in bed. NAD.   Eye Contact:  Good  Speech:  Clear and Coherent and Normal Rate  Volume:  Normal  Mood:  Depressed  Affect:  Congruent  Thought Process:  Goal Directed, Linear and Descriptions of Associations: Intact  Orientation:  Full (Time, Place, and Person)  Thought Content:  Logical  Suicidal Thoughts:  No  Homicidal Thoughts:  No  Memory:  Immediate;   Good Recent;   Good Remote;   Good  Judgement:  Fair  Insight:  Fair  Psychomotor Activity:  Normal  Concentration:  Concentration: Good and Attention Span: Good  Recall:  Good  Fund of Knowledge:  Good  Language:  Good  Akathisia:  No  Handed:  Right  AIMS (if indicated):   N/A  Assets:  Communication Skills Desire for Improvement Financial Resources/Insurance Housing Resilience Social Support  ADL's:  Intact  Cognition:  WNL  Sleep:   Poor   Assessment:  Emily Hicks is a 33 y.o. female who was admitted with altered mental status in the  setting of fever and generalized weakness and found to have sepsis of unknown etiology. Patient endorses worsening depression since April after being diagnosed with multiple sclerosis. She was started on antidepressants in May without noticeable improvement in her mood. She endorses anhedonia, poor appetite with weight loss and poor sleep. Recommend increasing Cymbalta for depressive symptoms.     Treatment Plan Summary: -Increase Cymbalta 60 mg daily to 90 mg daily for depression and anxiety. -Discontinue Remeron and start Trazodone 50 mg qhs for insomnia.   -EKG reviewed and QTc 455 on 7/3. Please closely monitor when starting or increasing QTc prolonging agents.  -Patient should continue to follow up with her outpatient provider for medication management.  -Psychiatry will sign off on patient at this time. Please consult psychiatry again as needed.    Disposition: No evidence of imminent risk to self or others at present.   Patient does not meet criteria for psychiatric inpatient admission.  This service was provided via telemedicine using a 2-way, interactive audio and video technology.  Names of all persons participating in this telemedicine service and their role in this encounter. Name: Juanetta Beets, DO Role: Psychiatrist   Name: Emily Hicks Role: Patient    Cherly Beach, DO 08/13/2018 11:46 AM

## 2018-08-13 NOTE — Progress Notes (Signed)
PROGRESS NOTE    Emily Hicks   EKC:[id number]  DOB: 06-Jan-1986  DOA: 07/27/2018 PCP: [name], MD   Brief Narrative:  Emily Hicks is an 33 y.o.femalewith medical history significant ofmultiple sclerosis, depression who was brought by her husband to the emergency department for the evaluation of altered mental status, fever, generalized weakness.For her MS, she was on Aubagio until a week ago due to intolerance. She presents with Confusion, weakness, loss of appetite,sepsis.Found to have severe oropharyngeal thrush with odynophagia.Ritta Slot has much improved, odynophagia has resolved.  Fever recurred on 08/03/2018.      Subjective: NO complaints today. Admits to me that she is depressed mostly about current hospitalization. Her husband has been insisting on a psych eval and today she is in agreement with it. She has no appetite still. No other complaints.   Assessment & Plan:   Principal Problem:   Sepsis - source still not determined- she has oropharyngeal candidiasis but this does not routinely cause fevers - ID consulted and extensive work up ordered  - 6/28- CT chest Abd/ Pelvis> scattered ground-glass airspace opacities bilaterally, greatest within the right upper lobe, mild diffuse adenopathy in chest, abdomen and pelvis, peripancreatic free fluid - the patient was on various different antibiotics early on during the hospital stay (Vanc, Ceftriaxone, Zosyn) but antibiotics stopped on 7/1 by ID and recommendation made to avoid aniboitics - 2 d ECHO > no endocarditis - Blood cultures- contaminant noted on 7/1 cultures> coag neg staph in 1 set - subsequently, blood cultures have been negative - c diff neg - Maxillofacial CT unrevealing - elevated CMV IgM but CMV PCR negative - CT chest repeated today shows that lung opacities have resolved - last fever was 100.5 on 7/7- now has fever again of 101.4 - ANA is positive 1:1280- will order a reflex panel - of note a  reflex panel was done in Feb of this year and she was noted to have a positive anti dsDNA, anti-smith, anti-RNP, Anti-SSA suggesting a diagnosis of SLE - since we do not have an inpatient rheumatology service, I made an attempt to transfer her to a tertiary care center- she is in agreement with this- I called [facility] yesterday - they did not get back to me until after 7 PM and told me no beds were available - C3 and C4 noted to be low consistent with autoimmune disorder - awaiting reflex panel  Active Problems:   Candida esophagitis - cont Diflucan, per ID, it is currently dosed as weekly  Leukopenia, anemia, mild thrombocytopenia - based on past blood work, it appears she has been slighly leukopenic since 03/31/18 (WBC 3.4 at that time) - WBC now 1.3- ? If secondary to Aubagio - baseline Hb ~ 12-13- now is ~ 7-8 range - appreciate oncology eval- Dr Ammie Dalton would like to have a bone marrow biopsy performed but the patient is hesitant - SPEP negative - see above about ANA results- she continues to refuse a bone marrow biopsy due to fear of pain  Swelling of lips - likely due to autoimmune process -  added Benardryl PRN & Pepcid BID but these have not helped    MS (multiple sclerosis)   - recent diagnosis - she stopped taking Aubagio about 1 wk prior to admission as she was not feeling well - MRI on 6/29- shows no acute findings  Hypernatremia/ dehydration/ slightly elevated Cr on admisson - has resolved  Hypokalemia, Hypophosphatemia, Hypomagnesemia - actively being replaced- likely due  to inability to take adequate oral intake     Mildly elevated LFTs - borderline enlarged liver noted on CT- follow  Severe protein calorie malnutrition - cont dietary supplements- she is very malnourished  Severe weakness - due to acute illness with underlying MS - may need to go to CIR once clinically improved  Morbid obesity Body mass index is 37.07 kg/m.   Depression - resume Cymbalta  and Remeron   Time spent in minutes: 40 min in reviewing prior notes, radiology reports and labs results DVT prophylaxis: Lovenox Code Status: Full code Family Communication:  Disposition Plan: PT recommends CIR Consultants:   ID  GI  Phone consult with rheumatology Procedures:   none Antimicrobials:  Anti-infectives (From admission, onward)   Start     Dose/Rate Route Frequency Ordered Stop   08/14/18 0859  fluconazole (DIFLUCAN) tablet 100 mg     100 mg Oral Weekly 08/07/18 1128     08/08/18 0815  amoxicillin (AMOXIL) capsule 500 mg  Status:  Discontinued     500 mg Oral Every 8 hours 08/08/18 0745 08/08/18 1409   08/06/18 1000  fluconazole (DIFLUCAN) tablet 100 mg  Status:  Discontinued     100 mg Oral Daily 08/05/18 1643 08/07/18 1128   08/04/18 2200  piperacillin-tazobactam (ZOSYN) IVPB 3.375 g  Status:  Discontinued     3.375 g 12.5 mL/hr over 240 Minutes Intravenous Every 8 hours 08/04/18 2156 08/05/18 1643   08/03/18 1000  fluconazole (DIFLUCAN) tablet 200 mg  Status:  Discontinued     200 mg Oral Daily 08/02/18 1500 08/05/18 1643   07/28/18 1700  vancomycin (VANCOCIN) IVPB 750 mg/150 ml premix  Status:  Discontinued     750 mg 150 mL/hr over 60 Minutes Intravenous Every 24 hours 07/27/18 1606 07/28/18 0947   07/28/18 1000  fluconazole (DIFLUCAN) IVPB 200 mg  Status:  Discontinued     200 mg 100 mL/hr over 60 Minutes Intravenous Every 24 hours 07/28/18 0954 08/02/18 1500   07/27/18 1800  ceFEPIme (MAXIPIME) 2 g in sodium chloride 0.9 % 100 mL IVPB  Status:  Discontinued     2 g 200 mL/hr over 30 Minutes Intravenous Every 8 hours 07/27/18 1606 08/01/18 1414   07/27/18 1730  fluconazole (DIFLUCAN) tablet 200 mg  Status:  Discontinued     200 mg Oral  Once 07/27/18 1717 07/28/18 0953   07/27/18 1615  vancomycin (VANCOCIN) 1,500 mg in sodium chloride 0.9 % 500 mL IVPB     1,500 mg 250 mL/hr over 120 Minutes Intravenous  Once 07/27/18 1606 07/27/18 1851   07/27/18  1415  cefTRIAXone (ROCEPHIN) 1 g in sodium chloride 0.9 % 100 mL IVPB     1 g 200 mL/hr over 30 Minutes Intravenous  Once 07/27/18 1404 07/27/18 1642       Objective: Vitals:   08/12/18 0507 08/12/18 1511 08/12/18 2058 08/13/18 0501  BP: 121/74 121/81 123/70 125/81  Pulse: (!) 122 (!) 126 (!) 127 (!) 126  Resp: '19 14 18 18  ' Temp: 98.7 F (37.1 C) 98.2 F (36.8 C) 98.6 F (37 C) 100.1 F (37.8 C)  TempSrc: Oral Oral Oral Oral  SpO2: 94% 100% 90% 94%  Weight:      Height:        Intake/Output Summary (Last 24 hours) at 08/13/2018 1031 Last data filed at 08/13/2018 0646 Gross per 24 hour  Intake 2093.44 ml  Output 400 ml  Net 1693.44 ml   [employer]  07/27/18 1745 07/31/18 0300  Weight: 83.3 kg 89 kg    Examination: General exam: Appears comfortable  HEENT: PERRLA, oral mucosa moist, no sclera icterus or thrush- severely swollen lips Respiratory system: Clear to auscultation. Respiratory effort normal. Cardiovascular system: S1 & S2 heard,  No murmurs  Gastrointestinal system: Abdomen soft, non-tender, nondistended. Normal bowel sounds   Central nervous system: Alert and oriented. No focal neurological deficits. Extremities: No cyanosis, clubbing or edema Skin: No rashes or ulcers Psychiatry:  flat affect     Data Reviewed: I have personally reviewed following labs and imaging studies  CBC: Recent Labs  Lab 08/09/18 0947 08/09/18 1912 08/10/18 1209 08/11/18 0441 08/12/18 1026  WBC 1.3* 1.4* 1.1* 1.3* 1.5*  NEUTROABS 1.0* 0.9* 0.7* 0.7* 0.9*  HGB 7.1* 7.7* 7.8* 8.1* 7.8*  HCT 23.9* 24.7* 26.0* 26.4* 26.2*  MCV 92.3 91.8 92.5 93.3 91.9  PLT 148* 163 155 178 800*   Basic Metabolic Panel: Recent Labs  Lab 08/07/18 0354 08/08/18 0428 08/09/18 0947 08/10/18 1209 08/11/18 0441 08/12/18 1026 08/13/18 0354  NA 139 140 140 141 141 143 146*  K 3.3* 3.3* 3.5 3.5 4.3 3.5 3.9  CL 115* 117* 117* 118* 120* 118* 123*  CO2 18* 18* 16* 15* 18* 16* 16*    GLUCOSE 95 87 81 88 88 87 90  BUN '7 10 13 12 11 12 12  ' CREATININE 0.63 0.84 1.08* 1.06* 1.09* 0.92 0.92  CALCIUM 6.8* 6.4* 7.2* 7.2* 7.2* 7.2* 7.3*  MG 1.5* 1.4* 2.1  --   --  1.4*  --   PHOS 1.9* 2.8 2.7  --   --  1.7* 1.5*   GFR: Estimated Creatinine Clearance: 89.1 mL/min (by C-G formula based on SCr of 0.92 mg/dL). Liver Function Tests: Recent Labs  Lab 08/08/18 0428 08/09/18 1912 08/13/18 0354  AST 87*  --   --   ALT 28  --   --   ALKPHOS 74  --   --   BILITOT 0.3  --   --   PROT 5.1*  --   --   ALBUMIN 1.7* 2.2* 1.9*   No results for input(s): LIPASE, AMYLASE in the last 168 hours. No results for input(s): AMMONIA in the last 168 hours. Coagulation Profile: No results for input(s): INR, PROTIME in the last 168 hours. Cardiac Enzymes: No results for input(s): CKTOTAL, CKMB, CKMBINDEX, TROPONINI in the last 168 hours. BNP (last 3 results) No results for input(s): PROBNP in the last 8760 hours. HbA1C: No results for input(s): HGBA1C in the last 72 hours. CBG: No results for input(s): GLUCAP in the last 168 hours. Lipid Profile: No results for input(s): CHOL, HDL, LDLCALC, TRIG, CHOLHDL, LDLDIRECT in the last 72 hours. Thyroid Function Tests: No results for input(s): TSH, T4TOTAL, FREET4, T3FREE, THYROIDAB in the last 72 hours. Anemia Panel: No results for input(s): VITAMINB12, FOLATE, FERRITIN, TIBC, IRON, RETICCTPCT in the last 72 hours. Urine analysis:    Component Value Date/Time   COLORURINE YELLOW 08/04/2018 2135   APPEARANCEUR HAZY (A) 08/04/2018 2135   LABSPEC 1.018 08/04/2018 2135   PHURINE 6.0 08/04/2018 2135   GLUCOSEU NEGATIVE 08/04/2018 2135   HGBUR NEGATIVE 08/04/2018 2135   BILIRUBINUR NEGATIVE 08/04/2018 2135   BILIRUBINUR Neg 05/08/2010 1553   KETONESUR NEGATIVE 08/04/2018 2135   PROTEINUR 100 (A) 08/04/2018 2135   UROBILINOGEN 1.0 11/11/2013 0150   NITRITE NEGATIVE 08/04/2018 2135   LEUKOCYTESUR NEGATIVE 08/04/2018 2135   Sepsis  Labs: '@LABRCNTIP' (procalcitonin:4,lacticidven:4) ) Recent Results (from  the past 240 hour(s))  Urine Culture     Status: None   Collection Time: 08/04/18  9:35 PM   Specimen: Urine, Clean Catch  Result Value Ref Range Status   Specimen Description   Final    URINE, CLEAN CATCH Performed at [facility], [place] [address], [place], [place] [address]    Special Requests   Final    NONE Performed at [facility], [place] [address] [place], [place] [address]    Culture   Final    NO GROWTH Performed at [facility], [place] [address], [place], [place] [address]    Report Status 08/06/2018 FINAL  Final  Culture, blood (routine x 2)     Status: None   Collection Time: 08/04/18  9:41 PM   Specimen: BLOOD  Result Value Ref Range Status   Specimen Description   Final    BLOOD BLOOD RIGHT ARM Performed at [facility] [address], [place], [place] [address]    Special Requests   Final    BOTTLES DRAWN AEROBIC ONLY Blood Culture adequate volume Performed at [facility] [address] [place], [place] [address]    Culture   Final    NO GROWTH 5 DAYS Performed at [facility], [place] [address], [place], [place] [address]    Report Status 08/10/2018 FINAL  Final  Culture, blood (routine x 2)     Status: Abnormal   Collection Time: 08/04/18  9:46 PM   Specimen: BLOOD  Result Value Ref Range Status   Specimen Description   Final    BLOOD BLOOD LEFT HAND Performed at [facility] [address], [place], [place] [address]    Special Requests   Final    BOTTLES DRAWN AEROBIC AND ANAEROBIC Blood Culture adequate volume Performed at [facility] [address] [place], [place] [address]    Culture  Setup Time   Final    AEROBIC BOTTLE ONLY GRAM POSITIVE COCCI CRITICAL RESULT CALLED TO, READ BACK BY AND VERIFIED WITH: J GRIMSLEY PHARMD 08/06/18 0400 JDW     Culture (A)  Final    STAPHYLOCOCCUS SPECIES (COAGULASE NEGATIVE) THE SIGNIFICANCE OF ISOLATING THIS ORGANISM FROM A SINGLE SET OF BLOOD CULTURES WHEN MULTIPLE SETS ARE DRAWN IS UNCERTAIN. PLEASE NOTIFY THE MICROBIOLOGY DEPARTMENT WITHIN ONE WEEK IF SPECIATION AND SENSITIVITIES ARE REQUIRED. Performed at [facility], [place] [address] [place], [place] [address]    Report Status 08/07/2018 FINAL  Final  Blood Culture ID Panel (Reflexed)     Status: Abnormal   Collection Time: 08/04/18  9:46 PM  Result Value Ref Range Status   Enterococcus species NOT DETECTED NOT DETECTED Final   Listeria monocytogenes NOT DETECTED NOT DETECTED Final   Staphylococcus species DETECTED (A) NOT DETECTED Final    Comment: Methicillin (oxacillin) resistant coagulase negative staphylococcus. Possible blood culture contaminant (unless isolated from more than one blood culture draw or clinical case suggests pathogenicity). No antibiotic treatment is indicated for blood  culture contaminants. CRITICAL RESULT CALLED TO, READ BACK BY AND VERIFIED WITH: J GRIMSLEY PHARMD 08/06/18 0400 JDW    Staphylococcus aureus (BCID) NOT DETECTED NOT DETECTED Final   Methicillin resistance DETECTED (A) NOT DETECTED Final    Comment: CRITICAL RESULT CALLED TO, READ BACK BY AND VERIFIED WITH: J GRIMSLEY PHARMD 08/06/18 0400 JDW    Streptococcus species NOT DETECTED NOT DETECTED Final   Streptococcus agalactiae  NOT DETECTED NOT DETECTED Final   Streptococcus pneumoniae NOT DETECTED NOT DETECTED Final   Streptococcus pyogenes NOT DETECTED NOT DETECTED Final   Acinetobacter baumannii NOT DETECTED NOT DETECTED Final   Enterobacteriaceae species NOT DETECTED NOT DETECTED Final   Enterobacter cloacae complex NOT DETECTED NOT DETECTED Final   Escherichia coli NOT DETECTED NOT DETECTED Final   Klebsiella oxytoca NOT DETECTED NOT DETECTED Final   Klebsiella pneumoniae NOT DETECTED NOT DETECTED Final   Proteus species NOT DETECTED NOT  DETECTED Final   Serratia marcescens NOT DETECTED NOT DETECTED Final   Haemophilus influenzae NOT DETECTED NOT DETECTED Final   Neisseria meningitidis NOT DETECTED NOT DETECTED Final   Pseudomonas aeruginosa NOT DETECTED NOT DETECTED Final   Candida albicans NOT DETECTED NOT DETECTED Final   Candida glabrata NOT DETECTED NOT DETECTED Final   Candida krusei NOT DETECTED NOT DETECTED Final   Candida parapsilosis NOT DETECTED NOT DETECTED Final   Candida tropicalis NOT DETECTED NOT DETECTED Final    Comment: Performed at [facility], [place] [address] [place], [place] [address]  Culture, blood (routine x 2)     Status: None   Collection Time: 08/05/18  6:59 PM   Specimen: BLOOD  Result Value Ref Range Status   Specimen Description   Final    BLOOD LEFT HAND Performed at [facility] [address], [place], [place] [address]    Special Requests   Final    BOTTLES DRAWN AEROBIC ONLY Blood Culture adequate volume Performed at [facility] [address], [place], [place] [address]    Culture   Final    NO GROWTH 5 DAYS Performed at [facility], [place] [address] [place], [place] [address]    Report Status 08/10/2018 FINAL  Final  C difficile quick scan w PCR reflex     Status: None   Collection Time: 08/07/18 11:29 AM   Specimen: STOOL  Result Value Ref Range Status   C Diff antigen NEGATIVE NEGATIVE Final   C Diff toxin NEGATIVE NEGATIVE Final   C Diff interpretation No C. difficile detected.  Final    Comment: Performed at [facility], [place] [address], [place], [place] [address]  Culture, blood (Routine X 2) w Reflex to ID Panel     Status: None (Preliminary result)   Collection Time: 08/09/18  7:12 PM   Specimen: BLOOD LEFT HAND  Result Value Ref Range Status   Specimen Description   Final    BLOOD LEFT HAND Performed at [facility] [address], [place], [place] [address]    Special  Requests   Final    BOTTLES DRAWN AEROBIC ONLY Blood Culture adequate volume Performed at [facility] [address], [place], [place] [address]    Culture   Final    NO GROWTH 3 DAYS Performed at [facility], [place] [address] [place], [place] [address]    Report Status PENDING  Incomplete  Culture, blood (Routine X 2) w Reflex to ID Panel     Status: None (Preliminary result)   Collection Time: 08/09/18  7:12 PM   Specimen: BLOOD LEFT HAND  Result Value Ref Range Status   Specimen Description   Final    BLOOD LEFT HAND Performed at [facility] [address] [place], [place] [address]    Special Requests   Final    BOTTLES DRAWN AEROBIC ONLY Blood Culture  adequate volume Performed at [facility] [address], [place], [place] [address]    Culture   Final    NO GROWTH 3 DAYS Performed at [facility], [place] [address] [place], [place] [address]    Report Status PENDING  Incomplete  Culture, blood (routine x 2)     Status: None (Preliminary result)   Collection Time: 08/11/18  2:59 PM   Specimen: BLOOD  Result Value Ref Range Status   Specimen Description   Final    BLOOD LEFT ANTECUBITAL Performed at [facility] [address] [place], [place] [address]    Special Requests   Final    BOTTLES DRAWN AEROBIC ONLY Blood Culture adequate volume Performed at [facility] [address] [place], [place] [address]    Culture   Final    NO GROWTH < 24 HOURS Performed at [facility] [address] [place], [place] [address]    Report Status PENDING  Incomplete  Culture, blood (routine x 2)     Status: None (Preliminary result)   Collection Time: 08/11/18  2:59 PM   Specimen: BLOOD LEFT HAND  Result Value Ref Range Status   Specimen Description   Final    BLOOD LEFT HAND Performed at [facility] [address], [place], [place]  [address]    Special Requests   Final    BOTTLES DRAWN AEROBIC ONLY Blood Culture adequate volume Performed at [facility] [address], [place], [place] [address]    Culture   Final    NO GROWTH < 24 HOURS Performed at [facility] [address], [place], [place] [address]    Report Status PENDING  Incomplete         Radiology Studies: Ct Chest W Contrast  Result Date: 08/11/2018 CLINICAL DATA:  Fevers of unknown origin. EXAM: CT CHEST WITH CONTRAST TECHNIQUE: Multidetector CT imaging of the chest was performed during intravenous contrast administration. CONTRAST:  75 mL OMNIPAQUE IOHEXOL 300 MG/ML  SOLN COMPARISON:  CT chest, abdomen and pelvis 08/01/2018. PA and lateral chest 07/27/2010 and 08/05/2018 FINDINGS: Cardiovascular: Heart size is mildly enlarged. No pericardial effusion. No atherosclerotic vascular disease is seen. Mediastinum/Nodes: Small bilateral axillary lymph nodes have clearly visible fatty hila consistent with benignity. No mediastinal or hilar lymphadenopathy by CT size criteria. Lungs/Pleura: Small bilateral pleural effusions are not notably changed. Dependent bilateral lower lobe airspace disease is improved since the prior examination. Residual opacities have an appearance most compatible with atelectasis. Scattered ground-glass opacity seen on the comparison CT have resolved. 0.3 cm right upper lobe nodule on image 26 of series 5 is unchanged. Upper Abdomen: Status post cholecystectomy.  No acute finding. Musculoskeletal: T11 hemivertebra noted. No acute or focal abnormality. IMPRESSION: Scattered ground-glass opacities seen on the comparison CT have resolved. Dependent lower lobe airspace disease bilaterally is improved. Residual opacities have an appearance most compatible with atelectasis rather than pneumonia. No change in small bilateral pleural effusions. No change in a 0.3 cm right upper lobe pulmonary nodule. No follow-up needed if  patient is low-risk. Non-contrast chest CT can be considered in 12 months if patient is high-risk. This recommendation follows the consensus statement: Guidelines for Management of Incidental Pulmonary Nodules Detected on CT Images: From the Fleischner Society 2017; Radiology 2017; 284:228-243. Cardiomegaly. Electronically Signed   By: Inge Rise M.D.   On: 08/11/2018 11:01      Scheduled  Meds:  DULoxetine  60 mg Oral Daily   enoxaparin (LOVENOX) injection  40 mg Subcutaneous Q24H   feeding supplement  1 Container Oral Q24H   feeding supplement (ENSURE ENLIVE)  237 mL Oral Q24H   feeding supplement (PRO-STAT SUGAR FREE 64)  30 mL Oral BID   [START ON 08/14/2018] fluconazole  100 mg Oral Weekly   folic acid  2 mg Oral Daily   free water  300 mL Oral Q8H   hydrocortisone  25 mg Rectal BID   magnesium oxide  400 mg Oral BID   metoprolol tartrate  25 mg Oral BID   mirtazapine  15 mg Oral QHS   multivitamin with minerals  1 tablet Oral Daily   phosphorus  500 mg Oral TID   sodium chloride flush  3 mL Intravenous Once   Continuous Infusions:  famotidine (PEPCID) IV Stopped (08/12/18 2309)   sodium phosphate  Dextrose 5% IVPB 43 mL/hr at 08/13/18 0646     LOS: 17 days      Debbe Odea, MD [facility] Pager: [email or web address] Password [facility] 08/13/2018, 10:31 AM

## 2018-08-13 NOTE — Progress Notes (Signed)
Patient's husband was called and updated.

## 2018-08-13 NOTE — Progress Notes (Addendum)
HEMATOLOGY-ONCOLOGY PROGRESS NOTE  SUBJECTIVE: T-max 100.1 in the past 24 hours.  Denies chills.  Reports that her lips are more swollen but denies swelling of her tongue or difficulty breathing.  Denies bleeding.  REVIEW OF SYSTEMS:   Constitutional: Still having intermittent fevers. No chills.  Respiratory: Denies cough, dyspnea or wheezes Cardiovascular: Denies palpitation, chest discomfort Gastrointestinal:  Denies nausea, vomiting. Diarrhea has resolved.  Skin: Denies abnormal skin rashes Lymphatics: Denies new lymphadenopathy or easy bruising Neurological:Denies numbness, tingling or new weaknesses Behavioral/Psych: Mood is stable, no new changes  Extremities: No lower extremity edema All other systems were reviewed with the patient and are negative.  I have reviewed the past medical history, past surgical history, social history and family history with the patient and they are unchanged from previous note.   PHYSICAL EXAMINATION:  Vitals:   08/12/18 2058 08/13/18 0501  BP: 123/70 125/81  Pulse: (!) 127 (!) 126  Resp: 18 18  Temp: 98.6 F (37 C) 100.1 F (37.8 C)  SpO2: 90% 94%   Filed Weights   07/27/18 1745 07/31/18 0300  Weight: 183 lb 10.3 oz (83.3 kg) 196 lb 3.4 oz (89 kg)    Intake/Output from previous day: 07/09 0701 - 07/10 0700 In: 2093.4 [NG/GT:1800; IV Piggyback:293.4] Out: 400 [Urine:400]  GENERAL:alert, no distress  SKIN: no rashes or significant lesions OROPHARYNX: Swollen lips. Mucus membranes dry. No thrush.  LUNGS: clear to auscultation and percussion with normal breathing effort HEART: Tachycardic. Regular rhythm.  ABDOMEN:abdomen soft, non-tender and normal bowel sounds NEURO: alert & oriented x 3 with fluent speech, no focal motor/sensory deficits  LABORATORY DATA:  I have reviewed the data as listed CMP Latest Ref Rng & Units 08/13/2018 08/12/2018 08/11/2018  Glucose 70 - 99 mg/dL 90 87 88  BUN 6 - 20 mg/dL _0 Creatinine 0.44 - 1.00  mg/dL 0.92 0.92 1.09(H)  Sodium 135 - 145 mmol/L 146(H) 143 141  Potassium 3.5 - 5.1 mmol/L 3.9 3.5 4.3  Chloride 98 - 111 mmol/L 123(H) 118(H) 120(H)  CO2 22 - 32 mmol/L 16(L) 16(L) 18(L)  Calcium 8.9 - 10.3 mg/dL 7.3(L) 7.2(L) 7.2(L)  Total Protein 6.5 - 8.1 g/dL - - -  Total Bilirubin 0.3 - 1.2 mg/dL - - -  Alkaline Phos 38 - 126 U/L - - -  AST 15 - 41 U/L - - -  ALT 0 - 44 U/L - - -    Lab Results  Component Value Date   WBC 1.5 (L) 08/12/2018   HGB 7.8 (L) 08/12/2018   HCT 26.2 (L) 08/12/2018   MCV 91.9 08/12/2018   PLT 121 (L) 08/12/2018   NEUTROABS 0.9 (L) 08/12/2018    Dg Chest 2 View  Result Date: 08/05/2018 CLINICAL DATA:  Fever of unknown origin, multiple sclerosis EXAM: CHEST - 2 VIEW COMPARISON:  07/27/2018 FINDINGS: Borderline enlargement of cardiac silhouette. Mediastinal contours and pulmonary vascularity normal. Bibasilar opacities question atelectasis versus infiltrate. Lungs otherwise clear. No pleural effusion or pneumothorax. Bones unremarkable. IMPRESSION: Bibasilar opacities question atelectasis versus infiltrate. Electronically Signed   By: Lavonia Dana M.D.   On: 08/05/2018 08:27   Dg Chest 2 View  Result Date: 07/27/2018 CLINICAL DATA:  Fever and malaise EXAM: CHEST - 2 VIEW COMPARISON:  July 16, 2018 FINDINGS: Lungs are clear. The heart size and pulmonary vascularity are normal. No adenopathy. No bone lesions. IMPRESSION: No edema or consolidation. Electronically Signed   By: Lowella Grip III M.D.   On: 07/27/2018  15:00   Ct Chest W Contrast  Result Date: 08/11/2018 CLINICAL DATA:  Fevers of unknown origin. EXAM: CT CHEST WITH CONTRAST TECHNIQUE: Multidetector CT imaging of the chest was performed during intravenous contrast administration. CONTRAST:  75 mL OMNIPAQUE IOHEXOL 300 MG/ML  SOLN COMPARISON:  CT chest, abdomen and pelvis 08/01/2018. PA and lateral chest 07/27/2010 and 08/05/2018 FINDINGS: Cardiovascular: Heart size is mildly enlarged. No  pericardial effusion. No atherosclerotic vascular disease is seen. Mediastinum/Nodes: Small bilateral axillary lymph nodes have clearly visible fatty hila consistent with benignity. No mediastinal or hilar lymphadenopathy by CT size criteria. Lungs/Pleura: Small bilateral pleural effusions are not notably changed. Dependent bilateral lower lobe airspace disease is improved since the prior examination. Residual opacities have an appearance most compatible with atelectasis. Scattered ground-glass opacity seen on the comparison CT have resolved. 0.3 cm right upper lobe nodule on image 26 of series 5 is unchanged. Upper Abdomen: Status post cholecystectomy.  No acute finding. Musculoskeletal: T11 hemivertebra noted. No acute or focal abnormality. IMPRESSION: Scattered ground-glass opacities seen on the comparison CT have resolved. Dependent lower lobe airspace disease bilaterally is improved. Residual opacities have an appearance most compatible with atelectasis rather than pneumonia. No change in small bilateral pleural effusions. No change in a 0.3 cm right upper lobe pulmonary nodule. No follow-up needed if patient is low-risk. Non-contrast chest CT can be considered in 12 months if patient is high-risk. This recommendation follows the consensus statement: Guidelines for Management of Incidental Pulmonary Nodules Detected on CT Images: From the Fleischner Society 2017; Radiology 2017; 284:228-243. Cardiomegaly. Electronically Signed   By: Inge Rise M.D.   On: 08/11/2018 11:01   Ct Chest W Contrast  Result Date: 08/02/2018 CLINICAL DATA:  Fever of unknown origin. EXAM: CT CHEST, ABDOMEN, AND PELVIS WITH CONTRAST TECHNIQUE: Multidetector CT imaging of the chest, abdomen and pelvis was performed following the standard protocol during bolus administration of intravenous contrast. CONTRAST:  131m OMNIPAQUE IOHEXOL 300 MG/ML SOLN, 335mOMNIPAQUE IOHEXOL 300 MG/ML SOLN COMPARISON:  None. FINDINGS: CT CHEST  FINDINGS Cardiovascular: There is no large centrally located pulmonary embolus. Detection of smaller segmental and subsegmental pulmonary emboli is limited by contrast bolus timing. The main pulmonary artery is within normal limits for size. There is no CT evidence of acute right heart strain. The visualized aorta is normal. Heart size is normal. There is a trace pericardial effusion. Mediastinum/Nodes: --there are mildly prominent mediastinal and hilar lymph nodes bilaterally. --there is mild bilateral axillary adenopathy. --No supraclavicular lymphadenopathy. --Normal thyroid gland. --The esophagus is unremarkable Lungs/Pleura: There are trace bilateral pleural effusions with adjacent atelectasis. There is scattered ground-glass airspace opacities bilaterally, greatest within the right upper lobe. There is no pneumothorax. The trachea is unremarkable. Musculoskeletal: No chest wall abnormality. No acute or significant osseous findings. 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 CT ABDOMEN PELVIS FINDINGS Hepatobiliary: The liver is borderline enlarged. Status post cholecystectomy.There is no biliary ductal dilation. Pancreas: There is some mild peripancreatic fat stranding. Spleen: There is a small wedge-shaped defect in the lower pole the spleen measuring approximately 1.8 cm (axial series 2 image 47). Adrenals/Urinary Tract: --Adrenal glands: No adrenal hemorrhage. --Right kidney/ureter: No hydronephrosis or perinephric hematoma. --Left kidney/ureter: No hydronephrosis or perinephric hematoma. --Urinary bladder: Unremarkable. Stomach/Bowel: --Stomach/Duodenum: No hiatal hernia or other gastric abnormality. Normal duodenal course and caliber. --Small bowel: No dilatation or inflammation. --Colon: No focal abnormality. --Appendix: Normal. Vascular/Lymphatic: Normal course and caliber of the major abdominal vessels. There is a  retroaortic left renal vein, a normal variant. --there are  prominent retroperitoneal lymph nodes. --there are prominent mesenteric lymph nodes. There is haziness throughout the mesentery. There are enlarged right lower quadrant lymph nodes measuring approximately 1.1 cm in diameter. --there are mildly enlarged pelvic and inguinal lymph nodes. Reproductive: There is a small amount of free fluid in the pelvis which may be physiologic. There is no discrete ovarian mass. Other: There is a small volume of pelvic free fluid which is likely physiologic. No free air. There is some mild fat stranding involving the anterior abdominal wall bilaterally. Musculoskeletal. No acute displaced fractures. IMPRESSION: 1. Scattered ground-glass airspace opacities, greatest within the right upper lobe, concerning for multifocal pneumonia. 2. Small bilateral pleural effusions with adjacent atelectasis. 3. Mild diffuse adenopathy throughout the chest, abdomen, and pelvis of unknown clinical significance. 4. Peripancreatic free fluid with edema within the root of the mesentery. Findings are nonspecific and can be seen in patients with pancreatitis, versus less likely sclerosing mesenteritis or inflammatory bowel disease. There are some prominent lymph nodes right lower quadrant which can be seen in patients with mesenteric adenitis. 5. Normal appendix in the right lower quadrant. 6. Small volume free fluid in the pelvis favored to be physiologic. 7. Wedge-shaped hypoattenuating defect in the spleen of unknown clinical significance. Differential considerations include a splenic infarct versus less likely a splenic laceration, especially in the absence of a history of trauma. There is no perisplenic hematoma. Electronically Signed   By: Constance Holster M.D.   On: 08/02/2018 00:18   Mr Brain Wo Contrast  Result Date: 07/28/2018 CLINICAL DATA:  Encephalopathy with hallucinations EXAM: MRI HEAD WITHOUT CONTRAST TECHNIQUE: Multiplanar, multiecho pulse sequences of the brain and surrounding  structures were obtained without intravenous contrast. COMPARISON:  Brain MRI 03/24/2018 FINDINGS: BRAIN: There is no acute infarct, acute hemorrhage or extra-axial collection. The midline structures are normal. The white matter signal is normal for the patient's age. The cerebral and cerebellar volume are age-appropriate. No hydrocephalus. Susceptibility-sensitive sequences show no chronic microhemorrhage or superficial siderosis. No midline shift or other mass effect. VASCULAR: The major intracranial arterial and venous sinus flow voids are normal. SKULL AND UPPER CERVICAL SPINE: Calvarial bone marrow signal is normal. There is no skull base mass. Visualized upper cervical spine and soft tissues are normal. SINUSES/ORBITS: No fluid levels or advanced mucosal thickening. No mastoid or middle ear effusion. The orbits are normal. IMPRESSION: Normal brain. Electronically Signed   By: Ulyses Jarred M.D.   On: 07/28/2018 00:13   Mr Lumbar Spine Wo Contrast  Result Date: 08/02/2018 CLINICAL DATA:  Fever. Lower extremity weakness. History of multiple sclerosis. EXAM: MRI LUMBAR SPINE WITHOUT CONTRAST TECHNIQUE: Multiplanar, multisequence MR imaging of the lumbar spine was performed. No intravenous contrast was administered. COMPARISON:  CT abdomen pelvis from yesterday. FINDINGS: Despite efforts by the technologist and patient, motion artifact is present on today's exam and could not be eliminated. This reduces exam sensitivity and specificity. Segmentation:  Standard. Alignment:  Physiologic. Vertebrae:  No fracture, evidence of discitis, or bone lesion. Conus medullaris and cauda equina: Conus extends to the L1-L2 level. Conus and cauda equina appear normal. Paraspinal and other soft tissues: Symmetric bilateral paraspinous muscle edema. Otherwise negative. Disc levels: No significant disc bulge or herniation. No spinal canal or neuroforaminal stenosis. IMPRESSION: 1.  No acute abnormality or significant  degenerative changes. Electronically Signed   By: Titus Dubin M.D.   On: 08/02/2018 09:14   Ct Abdomen Pelvis W Contrast  Result Date: 08/02/2018 CLINICAL DATA:  Fever of unknown origin. EXAM: CT CHEST, ABDOMEN, AND PELVIS WITH CONTRAST TECHNIQUE: Multidetector CT imaging of the chest, abdomen and pelvis was performed following the standard protocol during bolus administration of intravenous contrast. CONTRAST:  191m OMNIPAQUE IOHEXOL 300 MG/ML SOLN, 373mOMNIPAQUE IOHEXOL 300 MG/ML SOLN COMPARISON:  None. FINDINGS: CT CHEST FINDINGS Cardiovascular: There is no large centrally located pulmonary embolus. Detection of smaller segmental and subsegmental pulmonary emboli is limited by contrast bolus timing. The main pulmonary artery is within normal limits for size. There is no CT evidence of acute right heart strain. The visualized aorta is normal. Heart size is normal. There is a trace pericardial effusion. Mediastinum/Nodes: --there are mildly prominent mediastinal and hilar lymph nodes bilaterally. --there is mild bilateral axillary adenopathy. --No supraclavicular lymphadenopathy. --Normal thyroid gland. --The esophagus is unremarkable Lungs/Pleura: There are trace bilateral pleural effusions with adjacent atelectasis. There is scattered ground-glass airspace opacities bilaterally, greatest within the right upper lobe. There is no pneumothorax. The trachea is unremarkable. Musculoskeletal: No chest wall abnormality. No acute or significant osseous findings. 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 CT ABDOMEN PELVIS FINDINGS Hepatobiliary: The liver is borderline enlarged. Status post cholecystectomy.There is no biliary ductal dilation. Pancreas: There is some mild peripancreatic fat stranding. Spleen: There is a small wedge-shaped defect in the lower pole the spleen measuring approximately 1.8 cm (axial series 2 image 47). Adrenals/Urinary Tract: --Adrenal glands: No adrenal  hemorrhage. --Right kidney/ureter: No hydronephrosis or perinephric hematoma. --Left kidney/ureter: No hydronephrosis or perinephric hematoma. --Urinary bladder: Unremarkable. Stomach/Bowel: --Stomach/Duodenum: No hiatal hernia or other gastric abnormality. Normal duodenal course and caliber. --Small bowel: No dilatation or inflammation. --Colon: No focal abnormality. --Appendix: Normal. Vascular/Lymphatic: Normal course and caliber of the major abdominal vessels. There is a retroaortic left renal vein, a normal variant. --there are prominent retroperitoneal lymph nodes. --there are prominent mesenteric lymph nodes. There is haziness throughout the mesentery. There are enlarged right lower quadrant lymph nodes measuring approximately 1.1 cm in diameter. --there are mildly enlarged pelvic and inguinal lymph nodes. Reproductive: There is a small amount of free fluid in the pelvis which may be physiologic. There is no discrete ovarian mass. Other: There is a small volume of pelvic free fluid which is likely physiologic. No free air. There is some mild fat stranding involving the anterior abdominal wall bilaterally. Musculoskeletal. No acute displaced fractures. IMPRESSION: 1. Scattered ground-glass airspace opacities, greatest within the right upper lobe, concerning for multifocal pneumonia. 2. Small bilateral pleural effusions with adjacent atelectasis. 3. Mild diffuse adenopathy throughout the chest, abdomen, and pelvis of unknown clinical significance. 4. Peripancreatic free fluid with edema within the root of the mesentery. Findings are nonspecific and can be seen in patients with pancreatitis, versus less likely sclerosing mesenteritis or inflammatory bowel disease. There are some prominent lymph nodes right lower quadrant which can be seen in patients with mesenteric adenitis. 5. Normal appendix in the right lower quadrant. 6. Small volume free fluid in the pelvis favored to be physiologic. 7. Wedge-shaped  hypoattenuating defect in the spleen of unknown clinical significance. Differential considerations include a splenic infarct versus less likely a splenic laceration, especially in the absence of a history of trauma. There is no perisplenic hematoma. Electronically Signed   By: ChConstance Holster.D.   On: 08/02/2018 00:18   Ct Maxillofacial W Contrast  Result Date: 08/08/2018 CLINICAL DATA:  Fevers.  Right mandibular molar tooth pain. EXAM: CT MAXILLOFACIAL WITH CONTRAST  TECHNIQUE: Multidetector CT imaging of the maxillofacial structures was performed with intravenous contrast. Multiplanar CT image reconstructions were also generated. CONTRAST:  56m OMNIPAQUE IOHEXOL 300 MG/ML  SOLN COMPARISON:  MRI 07/27/2018 FINDINGS: Osseous: No maxillary, mid face or upper face abnormality is seen. There is no evidence of active dental or periodontal disease. With respect to the right mandible, teeth 25 through 31 appear normal. Tooth 32 is absent and presumably has been extracted in the distant past. There is a benign appearing focus of hazy sclerosis measuring about 1.5 cm at the angle of the mandible on the right that could represent sequela of the previous extraction. If the patient has not had extraction of tooth 32, then this could be some dysplastic change related to aplasia of the tooth. I do not see any likely significant finding in this region. Left mandibular molar remains in place, not fully erupted. Orbits: Normal Sinuses: Clear Soft tissues: Normal. No soft tissue inflammatory changes are evident. Limited intracranial: Normal IMPRESSION: No evidence of dental or periodontal decay or inflammatory disease. Absent right mandibular molar tooth. Presumably there has been distant extraction. Hazy sclerosis within the mandible at the angle of the mandible could be chronic healed sequela of the molar tooth extraction. If there is not a history of molar tooth extraction, then there could be aplasia/hypoplasia of tooth  32. Electronically Signed   By: MNelson ChimesM.D.   On: 08/08/2018 11:49   Dg Chest Port 1 View  Result Date: 07/16/2018 CLINICAL DATA:  Fatigue EXAM: PORTABLE CHEST 1 VIEW COMPARISON:  None. FINDINGS: The heart size and mediastinal contours are within normal limits. Both lungs are clear. The visualized skeletal structures are unremarkable. IMPRESSION: No active disease. Electronically Signed   By: MInez CatalinaM.D.   On: 07/16/2018 15:18   Vas [place]Lower Extremity Venous (dvt)  Result Date: 08/06/2018  Lower Venous Study Indications: Immobility, fever.  Limitations: Patient positioning. Comparison Study: No prior Performing Technologist: MAbram SanderRVS  Examination Guidelines: A complete evaluation includes B-mode imaging, spectral Doppler, color Doppler, and power Doppler as needed of all accessible portions of each vessel. Bilateral testing is considered an integral part of a complete examination. Limited examinations for reoccurring indications may be performed as noted.  +---------+---------------+---------+-----------+----------+--------------+ RIGHT    CompressibilityPhasicitySpontaneityPropertiesSummary        +---------+---------------+---------+-----------+----------+--------------+ CFV      Full           Yes      Yes                                 +---------+---------------+---------+-----------+----------+--------------+ SFJ      Full                                                        +---------+---------------+---------+-----------+----------+--------------+ FV Prox  Full                                                        +---------+---------------+---------+-----------+----------+--------------+ FV Mid   Full                                                        +---------+---------------+---------+-----------+----------+--------------+  FV DistalFull                                                         +---------+---------------+---------+-----------+----------+--------------+ PFV      Full                                                        +---------+---------------+---------+-----------+----------+--------------+ POP      Full           Yes      Yes                                 +---------+---------------+---------+-----------+----------+--------------+ PTV      Full                                                        +---------+---------------+---------+-----------+----------+--------------+ PERO                                                  Not visualized +---------+---------------+---------+-----------+----------+--------------+   +---------+---------------+---------+-----------+----------+--------------+ LEFT     CompressibilityPhasicitySpontaneityPropertiesSummary        +---------+---------------+---------+-----------+----------+--------------+ CFV      Full           Yes      Yes                                 +---------+---------------+---------+-----------+----------+--------------+ SFJ      Full                                                        +---------+---------------+---------+-----------+----------+--------------+ FV Prox  Full                                                        +---------+---------------+---------+-----------+----------+--------------+ FV Mid   Full                                                        +---------+---------------+---------+-----------+----------+--------------+ FV DistalFull                                                        +---------+---------------+---------+-----------+----------+--------------+  PFV      Full                                                        +---------+---------------+---------+-----------+----------+--------------+ POP      Full           Yes      Yes                                  +---------+---------------+---------+-----------+----------+--------------+ PTV      Full                                                        +---------+---------------+---------+-----------+----------+--------------+ PERO                                                  Not visualized +---------+---------------+---------+-----------+----------+--------------+     Summary: Right: There is no evidence of deep vein thrombosis in the lower extremity. No cystic structure found in the popliteal fossa. Left: There is no evidence of deep vein thrombosis in the lower extremity. No cystic structure found in the popliteal fossa.  *See table(s) above for measurements and observations. Electronically signed by Servando Snare MD on 08/06/2018 at 11:00:59 AM.    Final     ASSESSMENT AND PLAN: 1. Pancytopenia -characterized by leukopenia, normocytic anemia, and mild, intermittent thrombocytopenia. 2.  Multiple sclerosis 3.  Positive ANA 4.  Fever 5.  Tachycardia 6.  Lip swelling 7.  Candida esophagitis  Ms. Zubiate appears unchanged. CBC not checked this morning but has remained stable through 08/12/2018. Labs do not show evidence of iron deficiency, Vitamin B12 deficiency, or folate deficiency. LDH is elevated, Reticulocytes not elevated, Haptoglobin was normal and SPEP did not show an M spike.  Repeat ANA was positive, 1: 1280. Suspect that pancytopenia is a result of her acute systemic infection, etiology of which is unclear.    1.  Her CBC has remained stable.  No need for daily CBC. 2.  Repeat LDH has been ordered and is pending. 3.  We have previously discussed staying with a bone marrow biopsy for further evaluation of her pancytopenia.  The patient has refused. 4. Recommend Rheumatology consult for elevated ANA.  Unfortunately, rheumatology does not perform inpatient consults.  Discussed with hospitalist who is considering transfer to [facility].    LOS: 17 days   Mikey Bussing, DNP, AGPCNP-BC,  AOCNP 08/13/18 Ms. Tardif appears stable.  She continues have fever and tachycardia.  I discussed the case with Dr. Wynelle Cleveland.  I agree with transfer to [facility] for a comprehensive multi specialty evaluation when a bed becomes available.  The LDH remains elevated.  This is nonspecific, but could indicate a hematopoietic malignancy.  I think this is unlikely.  She declines a bone marrow biopsy.  I suspect she has a collagen vascular disease accounting for many of her symptoms and laboratory findings.  Please call Oncology as needed over the weekend.  I will check on her 08/16/2018 if she remains in the hospital.

## 2018-08-14 DIAGNOSIS — D8989 Other specified disorders involving the immune mechanism, not elsewhere classified: Secondary | ICD-10-CM

## 2018-08-14 DIAGNOSIS — F419 Anxiety disorder, unspecified: Secondary | ICD-10-CM

## 2018-08-14 DIAGNOSIS — F329 Major depressive disorder, single episode, unspecified: Secondary | ICD-10-CM

## 2018-08-14 LAB — CBC WITH DIFFERENTIAL/PLATELET
Abs Immature Granulocytes: 0.06 10*3/uL (ref 0.00–0.07)
Basophils Absolute: 0 10*3/uL (ref 0.0–0.1)
Basophils Relative: 1 %
Eosinophils Absolute: 0 10*3/uL (ref 0.0–0.5)
Eosinophils Relative: 1 %
HCT: 24.6 % — ABNORMAL LOW (ref 36.0–46.0)
Hemoglobin: 7.3 g/dL — ABNORMAL LOW (ref 12.0–15.0)
Immature Granulocytes: 4 %
Lymphocytes Relative: 26 %
Lymphs Abs: 0.4 10*3/uL — ABNORMAL LOW (ref 0.7–4.0)
MCH: 27.4 pg (ref 26.0–34.0)
MCHC: 29.7 g/dL — ABNORMAL LOW (ref 30.0–36.0)
MCV: 92.5 fL (ref 80.0–100.0)
Monocytes Absolute: 0.1 10*3/uL (ref 0.1–1.0)
Monocytes Relative: 8 %
Neutro Abs: 0.9 10*3/uL — ABNORMAL LOW (ref 1.7–7.7)
Neutrophils Relative %: 60 %
Platelets: 125 10*3/uL — ABNORMAL LOW (ref 150–400)
RBC: 2.66 MIL/uL — ABNORMAL LOW (ref 3.87–5.11)
RDW: 20.3 % — ABNORMAL HIGH (ref 11.5–15.5)
WBC: 1.5 10*3/uL — ABNORMAL LOW (ref 4.0–10.5)
nRBC: 0 % (ref 0.0–0.2)

## 2018-08-14 LAB — CULTURE, BLOOD (ROUTINE X 2)
Culture: NO GROWTH
Culture: NO GROWTH
Special Requests: ADEQUATE
Special Requests: ADEQUATE

## 2018-08-14 LAB — DIC (DISSEMINATED INTRAVASCULAR COAGULATION)PANEL
D-Dimer, Quant: 3.8 ug/mL-FEU — ABNORMAL HIGH (ref 0.00–0.50)
Fibrinogen: 340 mg/dL (ref 210–475)
INR: 1.3 — ABNORMAL HIGH (ref 0.8–1.2)
Platelets: 117 10*3/uL — ABNORMAL LOW (ref 150–400)
Prothrombin Time: 15.6 seconds — ABNORMAL HIGH (ref 11.4–15.2)
Smear Review: NONE SEEN
aPTT: 37 seconds — ABNORMAL HIGH (ref 24–36)

## 2018-08-14 LAB — DIRECT ANTIGLOBULIN TEST (NOT AT ARMC)
DAT, IgG: POSITIVE
DAT, complement: NEGATIVE

## 2018-08-14 MED ORDER — METHYLPREDNISOLONE SODIUM SUCC 40 MG IJ SOLR
40.0000 mg | Freq: Two times a day (BID) | INTRAMUSCULAR | Status: DC
  Administered 2018-08-14 – 2018-08-15 (×4): 40 mg via INTRAVENOUS
  Filled 2018-08-14 (×4): qty 1

## 2018-08-14 NOTE — Progress Notes (Addendum)
PROGRESS NOTE    Emily Hicks   GLO:[id number]  DOB: 1985/03/30  DOA: 07/27/2018 PCP: [name], MD   Brief Narrative:  Emily Hicks is an 33 y.o.femalewith medical history significant ofmultiple sclerosis, depression who was brought by her husband to the emergency department for the evaluation of altered mental status, fever, generalized weakness.For her MS, she was on Aubagio until a week ago due to intolerance. She presents with Confusion, weakness, loss of appetite,sepsis.Found to have severe oropharyngeal thrush with odynophagia.    Fever recurred on 08/03/2018.      Subjective: No complaints. Still not eating much but trying to.   Assessment & Plan:   Principal Problem:   Sepsis - source still not determined- she has oropharyngeal candidiasis but this does not routinely cause fevers - ID consulted and extensive work up ordered  - 6/28- CT chest Abd/ Pelvis> scattered ground-glass airspace opacities bilaterally, greatest within the right upper lobe, mild diffuse adenopathy in chest, abdomen and pelvis, peripancreatic free fluid - the patient was on various different antibiotics early on during the hospital stay (Vanc, Ceftriaxone, Zosyn) but antibiotics stopped on 7/1 by ID and recommendation made to avoid aniboitics - 2 d ECHO > no endocarditis - Blood cultures- contaminant noted on 7/1 cultures> coag neg staph in 1 set - subsequently, blood cultures have been negative - c diff neg - Maxillofacial CT unrevealing - elevated CMV IgM but CMV PCR negative - CT chest repeated today shows that lung opacities have resolved - ANA is positive 1:1280- will order a reflex panel - of note a reflex panel was done in Feb of this year and she was noted to have a positive anti dsDNA, anti-smith, anti-RNP, Anti-SSA suggesting a diagnosis of SLE - C3 and C4 noted to be low consistent with autoimmune disorder -  reflex panel today consistent with SLE- will start IV steroids and  follow - still having fevers- plans for steroids discussed with outpt rheumatology  Active Problems:   Candida esophagitis - cont Diflucan   Leukopenia, anemia, mild thrombocytopenia - based on past blood work, it appears she has been slighly leukopenic since 03/31/18 (WBC 3.4 at that time) - WBC now 1.3- ? If secondary to Aubagio - baseline Hb ~ 12-13- now is ~ 7-8 range - appreciate oncology eval- Dr Ammie Dalton would like to have a bone marrow biopsy performed but the patient is hesitant - SPEP negative  - she continues to refuse a bone marrow biopsy due to fear of pain  Swelling of lips - likely due to autoimmune process -  added Benardryl PRN & Pepcid BID but these have not helped    MS (multiple sclerosis)   - recent diagnosis - she stopped taking Aubagio about 1 wk prior to admission as she was not feeling well - MRI on 6/29- shows no acute findings  Hypernatremia/ dehydration/ slightly elevated Cr on admisson - has resolved  Hypokalemia, Hypophosphatemia, Hypomagnesemia - actively being replaced- likely due to inability to take adequate oral intake     Mildly elevated LFTs - borderline enlarged liver noted on CT- follow  Severe protein calorie malnutrition - cont dietary supplements- she is very malnourished  Severe weakness - due to acute illness with underlying MS - may need to go to CIR once clinically improved  Morbid obesity Body mass index is 37.07 kg/m.   Depression - resume Cymbalta and Remeron   Time spent in minutes: 30 min  DVT prophylaxis: Lovenox Code Status: Full  code Family Communication:  Disposition Plan: PT recommends CIR Consultants:   ID  GI  Phone consult with rheumatology Procedures:   none Antimicrobials:  Anti-infectives (From admission, onward)   Start     Dose/Rate Route Frequency Ordered Stop   08/14/18 0859  fluconazole (DIFLUCAN) tablet 100 mg  Status:  Discontinued     100 mg Oral Weekly 08/07/18 1128 08/13/18 1037    08/13/18 1200  fluconazole (DIFLUCAN) tablet 100 mg     100 mg Oral Daily 08/13/18 1037     08/08/18 0815  amoxicillin (AMOXIL) capsule 500 mg  Status:  Discontinued     500 mg Oral Every 8 hours 08/08/18 0745 08/08/18 1409   08/06/18 1000  fluconazole (DIFLUCAN) tablet 100 mg  Status:  Discontinued     100 mg Oral Daily 08/05/18 1643 08/07/18 1128   08/04/18 2200  piperacillin-tazobactam (ZOSYN) IVPB 3.375 g  Status:  Discontinued     3.375 g 12.5 mL/hr over 240 Minutes Intravenous Every 8 hours 08/04/18 2156 08/05/18 1643   08/03/18 1000  fluconazole (DIFLUCAN) tablet 200 mg  Status:  Discontinued     200 mg Oral Daily 08/02/18 1500 08/05/18 1643   07/28/18 1700  vancomycin (VANCOCIN) IVPB 750 mg/150 ml premix  Status:  Discontinued     750 mg 150 mL/hr over 60 Minutes Intravenous Every 24 hours 07/27/18 1606 07/28/18 0947   07/28/18 1000  fluconazole (DIFLUCAN) IVPB 200 mg  Status:  Discontinued     200 mg 100 mL/hr over 60 Minutes Intravenous Every 24 hours 07/28/18 0954 08/02/18 1500   07/27/18 1800  ceFEPIme (MAXIPIME) 2 g in sodium chloride 0.9 % 100 mL IVPB  Status:  Discontinued     2 g 200 mL/hr over 30 Minutes Intravenous Every 8 hours 07/27/18 1606 08/01/18 1414   07/27/18 1730  fluconazole (DIFLUCAN) tablet 200 mg  Status:  Discontinued     200 mg Oral  Once 07/27/18 1717 07/28/18 0953   07/27/18 1615  vancomycin (VANCOCIN) 1,500 mg in sodium chloride 0.9 % 500 mL IVPB     1,500 mg 250 mL/hr over 120 Minutes Intravenous  Once 07/27/18 1606 07/27/18 1851   07/27/18 1415  cefTRIAXone (ROCEPHIN) 1 g in sodium chloride 0.9 % 100 mL IVPB     1 g 200 mL/hr over 30 Minutes Intravenous  Once 07/27/18 1404 07/27/18 1642       Objective: Vitals:   08/13/18 0501 08/13/18 1207 08/13/18 2025 08/14/18 0455  BP: 125/81 113/74 104/69 128/71  Pulse: (!) 126 (!) 122 (!) 125 (!) 126  Resp: '18 20 16 16  ' Temp: 100.1 F (37.8 C) 99.2 F (37.3 C) (!) 100.5 F (38.1 C) 98.2 F (36.8 C)   TempSrc: Oral Oral Oral Oral  SpO2: 94% 94% 91% 92%  Weight:      Height:        Intake/Output Summary (Last 24 hours) at 08/14/2018 1102 Last data filed at 08/14/2018 0631 Gross per 24 hour  Intake 450 ml  Output 500 ml  Net -50 ml   Filed Weights   07/27/18 1745 07/31/18 0300  Weight: 83.3 kg 89 kg    Examination: General exam: Appears comfortable  HEENT: PERRLA, oral mucosa moist, no sclera icterus - + for thrush- severely swollen lips Respiratory system: Clear to auscultation. Respiratory effort normal. Cardiovascular system: S1 & S2 heard,  No murmurs  Gastrointestinal system: Abdomen soft, non-tender, nondistended. Normal bowel sounds   Central nervous system: Alert and  oriented. No focal neurological deficits. Extremities: No cyanosis, clubbing or edema Skin: No rashes or ulcers Psychiatry:  flat affect    Data Reviewed: I have personally reviewed following labs and imaging studies  CBC: Recent Labs  Lab 08/10/18 1209 08/11/18 0441 08/12/18 1026 08/13/18 1243 08/14/18 0546  WBC 1.1* 1.3* 1.5* 1.5* 1.5*  NEUTROABS 0.7* 0.7* 0.9* 0.9* 0.9*  HGB 7.8* 8.1* 7.8* 7.8* 7.3*  HCT 26.0* 26.4* 26.2* 25.7* 24.6*  MCV 92.5 93.3 91.9 93.1 92.5  PLT 155 178 121* 115* 125*  093*   Basic Metabolic Panel: Recent Labs  Lab 08/08/18 0428 08/09/18 0947 08/10/18 1209 08/11/18 0441 08/12/18 1026 08/13/18 0354  NA 140 140 141 141 143 146*  K 3.3* 3.5 3.5 4.3 3.5 3.9  CL 117* 117* 118* 120* 118* 123*  CO2 18* 16* 15* 18* 16* 16*  GLUCOSE 87 81 88 88 87 90  BUN '10 13 12 11 12 12  ' CREATININE 0.84 1.08* 1.06* 1.09* 0.92 0.92  CALCIUM 6.4* 7.2* 7.2* 7.2* 7.2* 7.3*  MG 1.4* 2.1  --   --  1.4*  --   PHOS 2.8 2.7  --   --  1.7* 1.5*   GFR: Estimated Creatinine Clearance: 89.1 mL/min (by C-G formula based on SCr of 0.92 mg/dL). Liver Function Tests: Recent Labs  Lab 08/08/18 0428 08/09/18 1912 08/13/18 0354  AST 87*  --   --   ALT 28  --   --   ALKPHOS 74  --    --   BILITOT 0.3  --   --   PROT 5.1*  --   --   ALBUMIN 1.7* 2.2* 1.9*   No results for input(s): LIPASE, AMYLASE in the last 168 hours. No results for input(s): AMMONIA in the last 168 hours. Coagulation Profile: Recent Labs  Lab 08/14/18 0546  INR 1.3*   Cardiac Enzymes: No results for input(s): CKTOTAL, CKMB, CKMBINDEX, TROPONINI in the last 168 hours. BNP (last 3 results) No results for input(s): PROBNP in the last 8760 hours. HbA1C: No results for input(s): HGBA1C in the last 72 hours. CBG: No results for input(s): GLUCAP in the last 168 hours. Lipid Profile: No results for input(s): CHOL, HDL, LDLCALC, TRIG, CHOLHDL, LDLDIRECT in the last 72 hours. Thyroid Function Tests: No results for input(s): TSH, T4TOTAL, FREET4, T3FREE, THYROIDAB in the last 72 hours. Anemia Panel: No results for input(s): VITAMINB12, FOLATE, FERRITIN, TIBC, IRON, RETICCTPCT in the last 72 hours. Urine analysis:    Component Value Date/Time   COLORURINE YELLOW 08/04/2018 2135   APPEARANCEUR HAZY (A) 08/04/2018 2135   LABSPEC 1.018 08/04/2018 2135   PHURINE 6.0 08/04/2018 2135   GLUCOSEU NEGATIVE 08/04/2018 2135   HGBUR NEGATIVE 08/04/2018 2135   BILIRUBINUR NEGATIVE 08/04/2018 2135   BILIRUBINUR Neg 05/08/2010 1553   KETONESUR NEGATIVE 08/04/2018 2135   PROTEINUR 100 (A) 08/04/2018 2135   UROBILINOGEN 1.0 11/11/2013 0150   NITRITE NEGATIVE 08/04/2018 2135   LEUKOCYTESUR NEGATIVE 08/04/2018 2135   Sepsis Labs: '@LABRCNTIP' (procalcitonin:4,lacticidven:4) ) Recent Results (from the past 240 hour(s))  Urine Culture     Status: None   Collection Time: 08/04/18  9:35 PM   Specimen: Urine, Clean Catch  Result Value Ref Range Status   Specimen Description   Final    URINE, CLEAN CATCH Performed at [facility], [place] [address] [place], [place] [address]    Special Requests   Final    NONE Performed at [facility], [place]  [address] [place], [place]  [address]    Culture   Final    NO GROWTH Performed at [facility], [place] [address] [place], [place] [address]    Report Status 08/06/2018 FINAL  Final  Culture, blood (routine x 2)     Status: None   Collection Time: 08/04/18  9:41 PM   Specimen: BLOOD  Result Value Ref Range Status   Specimen Description   Final    BLOOD BLOOD RIGHT ARM Performed at [place] [address], [place], [place] [address]    Special Requests   Final    BOTTLES DRAWN AEROBIC ONLY Blood Culture adequate volume Performed at [place] [address], [place], [place] [address]    Culture   Final    NO GROWTH 5 DAYS Performed at [facility], [place] [address] [place], [place] [address]    Report Status 08/10/2018 FINAL  Final  Culture, blood (routine x 2)     Status: Abnormal   Collection Time: 08/04/18  9:46 PM   Specimen: BLOOD  Result Value Ref Range Status   Specimen Description   Final    BLOOD BLOOD LEFT HAND Performed at [place] [address] [place], [place] [address]    Special Requests   Final    BOTTLES DRAWN AEROBIC AND ANAEROBIC Blood Culture adequate volume Performed at [place] [address] [place], [place] [address]    Culture  Setup Time   Final    AEROBIC BOTTLE ONLY GRAM POSITIVE COCCI CRITICAL RESULT CALLED TO, READ BACK BY AND VERIFIED WITH: J GRIMSLEY PHARMD 08/06/18 0400 JDW    Culture (A)  Final    STAPHYLOCOCCUS SPECIES (COAGULASE NEGATIVE) THE SIGNIFICANCE OF ISOLATING THIS ORGANISM FROM A SINGLE SET OF BLOOD CULTURES WHEN MULTIPLE SETS ARE DRAWN IS UNCERTAIN. PLEASE NOTIFY THE MICROBIOLOGY DEPARTMENT WITHIN ONE WEEK IF SPECIATION AND SENSITIVITIES ARE REQUIRED. Performed at [facility], [place] [address] [place], [place] [address]    Report Status 08/07/2018 FINAL  Final  Blood Culture ID Panel (Reflexed)     Status: Abnormal   Collection Time: 08/04/18   9:46 PM  Result Value Ref Range Status   Enterococcus species NOT DETECTED NOT DETECTED Final   Listeria monocytogenes NOT DETECTED NOT DETECTED Final   Staphylococcus species DETECTED (A) NOT DETECTED Final    Comment: Methicillin (oxacillin) resistant coagulase negative staphylococcus. Possible blood culture contaminant (unless isolated from more than one blood culture draw or clinical case suggests pathogenicity). No antibiotic treatment is indicated for blood  culture contaminants. CRITICAL RESULT CALLED TO, READ BACK BY AND VERIFIED WITH: J GRIMSLEY PHARMD 08/06/18 0400 JDW    Staphylococcus aureus (BCID) NOT DETECTED NOT DETECTED Final   Methicillin resistance DETECTED (A) NOT DETECTED Final    Comment: CRITICAL RESULT CALLED TO, READ BACK BY AND VERIFIED WITH: J GRIMSLEY PHARMD 08/06/18 0400 JDW    Streptococcus species NOT DETECTED NOT DETECTED Final   Streptococcus agalactiae NOT DETECTED NOT DETECTED Final   Streptococcus pneumoniae NOT DETECTED NOT DETECTED Final   Streptococcus pyogenes NOT DETECTED NOT DETECTED Final   Acinetobacter baumannii NOT DETECTED NOT DETECTED Final   Enterobacteriaceae species NOT DETECTED NOT DETECTED Final   Enterobacter cloacae complex NOT DETECTED NOT DETECTED Final   Escherichia coli NOT DETECTED NOT DETECTED Final   Klebsiella oxytoca NOT DETECTED NOT DETECTED Final   Klebsiella pneumoniae NOT DETECTED NOT DETECTED Final  Proteus species NOT DETECTED NOT DETECTED Final   Serratia marcescens NOT DETECTED NOT DETECTED Final   Haemophilus influenzae NOT DETECTED NOT DETECTED Final   Neisseria meningitidis NOT DETECTED NOT DETECTED Final   Pseudomonas aeruginosa NOT DETECTED NOT DETECTED Final   Candida albicans NOT DETECTED NOT DETECTED Final   Candida glabrata NOT DETECTED NOT DETECTED Final   Candida krusei NOT DETECTED NOT DETECTED Final   Candida parapsilosis NOT DETECTED NOT DETECTED Final   Candida tropicalis NOT DETECTED NOT DETECTED  Final    Comment: Performed at [facility], [place] [address], [place], [place] [address]  Culture, blood (routine x 2)     Status: None   Collection Time: 08/05/18  6:59 PM   Specimen: BLOOD  Result Value Ref Range Status   Specimen Description   Final    BLOOD LEFT HAND Performed at [place] [address] [place], [place] [address]    Special Requests   Final    BOTTLES DRAWN AEROBIC ONLY Blood Culture adequate volume Performed at [place] [address] [place], [place] [address]    Culture   Final    NO GROWTH 5 DAYS Performed at [facility], [place] [address] [place], [place] [address]    Report Status 08/10/2018 FINAL  Final  C difficile quick scan w PCR reflex     Status: None   Collection Time: 08/07/18 11:29 AM   Specimen: STOOL  Result Value Ref Range Status   C Diff antigen NEGATIVE NEGATIVE Final   C Diff toxin NEGATIVE NEGATIVE Final   C Diff interpretation No C. difficile detected.  Final    Comment: Performed at [facility], [place] [address], [place], [place] [address]  Culture, blood (Routine X 2) w Reflex to ID Panel     Status: None (Preliminary result)   Collection Time: 08/09/18  7:12 PM   Specimen: BLOOD LEFT HAND  Result Value Ref Range Status   Specimen Description   Final    BLOOD LEFT HAND Performed at [place] [address], [place], [place] [address]    Special Requests   Final    BOTTLES DRAWN AEROBIC ONLY Blood Culture adequate volume Performed at [place] [address], [place], [place] [address]    Culture   Final    NO GROWTH 4 DAYS Performed at [facility], [place] [address], [place], [place] [address]    Report Status PENDING  Incomplete  Culture, blood (Routine X 2) w Reflex to ID Panel     Status: None (Preliminary result)   Collection Time: 08/09/18  7:12 PM   Specimen: BLOOD LEFT HAND  Result Value Ref  Range Status   Specimen Description   Final    BLOOD LEFT HAND Performed at [place] [address], [place], [place] [address]    Special Requests   Final    BOTTLES DRAWN AEROBIC ONLY Blood Culture adequate volume Performed at [place] [address], [place], [place] [address]    Culture   Final    NO GROWTH 4 DAYS Performed at [facility], [place] [address], [place], [place] [address]    Report Status PENDING  Incomplete  Culture, blood (routine x 2)     Status: None (Preliminary result)   Collection Time: 08/11/18  2:59 PM   Specimen: BLOOD  Result Value Ref  Range Status   Specimen Description   Final    BLOOD LEFT ANTECUBITAL Performed at [place] [address], [place], [place] [address]    Special Requests   Final    BOTTLES DRAWN AEROBIC ONLY Blood Culture adequate volume Performed at [place] [address], [place], [place] [address]    Culture   Final    NO GROWTH 2 DAYS Performed at [place] [address], [place], [place] [address]    Report Status PENDING  Incomplete  Culture, blood (routine x 2)     Status: None (Preliminary result)   Collection Time: 08/11/18  2:59 PM   Specimen: BLOOD LEFT HAND  Result Value Ref Range Status   Specimen Description   Final    BLOOD LEFT HAND Performed at [place] [address], [place], [place] [address]    Special Requests   Final    BOTTLES DRAWN AEROBIC ONLY Blood Culture adequate volume Performed at [place] [address] [place], [place] [address]    Culture   Final    NO GROWTH 2 DAYS Performed at [place] [address] [place], [place] [address]    Report Status PENDING  Incomplete         Radiology Studies: No results found.    Scheduled Meds: . DULoxetine  90 mg Oral Daily  . enoxaparin (LOVENOX) injection  40 mg  Subcutaneous Q24H  . feeding supplement  1 Container Oral Q24H  . feeding supplement (ENSURE ENLIVE)  237 mL Oral Q24H  . feeding supplement (PRO-STAT SUGAR FREE 64)  30 mL Oral BID  . fluconazole  100 mg Oral Daily  . folic acid  2 mg Oral Daily  . free water  300 mL Oral Q8H  . hydrocortisone  25 mg Rectal BID  . magnesium oxide  400 mg Oral BID  . metoprolol tartrate  25 mg Oral BID  . multivitamin with minerals  1 tablet Oral Daily  . nystatin  5 mL Oral QID  . phosphorus  500 mg Oral TID  . sodium chloride flush  3 mL Intravenous Once  . traZODone  50 mg Oral QHS   Continuous Infusions: . famotidine (PEPCID) IV 20 mg (08/14/18 0931)     LOS: 18 days      Debbe Odea, MD [facility] Pager: [email or web address] Password [email or web address] 08/14/2018, 11:02 AM

## 2018-08-14 NOTE — Progress Notes (Signed)
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  Dr.Sherrill will follow next week Please call if questions arise  All questions were answered. The patient knows to call the clinic with any problems, questions or concerns.   Artis DelayNi Bralyn Folkert, MD 08/14/2018 8:24 AM  Subjective:  She feels okay this morning.  Noted low-grade fever yesterday. The patient denies any recent signs or symptoms of bleeding such as spontaneous epistaxis, hematuria or hematochezia. She denies localizing symptoms such as sore throat or cough.  No shortness of breath  Objective:  Vitals:   08/13/18 2025 08/14/18 0455  BP: 104/69 128/71  Pulse: (!) 125 (!) 126  Resp: 16 16  Temp: (!) 100.5 F (38.1 C) 98.2 F (36.8 C)  SpO2: 91% 92%     Intake/Output Summary (Last 24 hours) at 08/14/2018 0824 Last data filed at 08/14/2018 0631 Gross per 24 hour  Intake 700 ml  Output 500 ml  Net 200 ml    GENERAL:alert, no distress and comfortable SKIN: skin color, texture, turgor are normal, no rashes or significant lesions EYES: normal, Conjunctiva are pink and non-injected, sclera clear OROPHARYNX: Profound lip swelling noted NECK: supple, thyroid normal size, non-tender, without nodularity LYMPH:  no palpable lymphadenopathy in the cervical, axillary or inguinal LUNGS: clear to auscultation and percussion with normal breathing effort HEART:  regular rate & rhythm and no murmurs and no lower extremity edema ABDOMEN:abdomen soft, non-tender and normal bowel sounds Musculoskeletal:no cyanosis of digits and no clubbing  NEURO: alert & oriented x 3 with fluent speech, no focal motor/sensory deficits   Labs:  Recent Labs    07/05/18 1358  08/02/18 0500  08/05/18 1607  08/08/18 0428  08/09/18 1912  08/11/18 0441 08/12/18 1026 08/13/18 0354  NA  --    < > 142   < > 139   < > 140   < >  --    < > 141 143 146*  K  --    < > 2.9*   < > 3.2*   < > 3.3*   < >  --    < > 4.3 3.5 3.9  CL  --    < > 118*   < > 116*   < > 117*   < >  --    < > 120* 118* 123*  CO2  --    < > 17*   < > 18*   < > 18*   < >  --    < > 18* 16* 16*  GLUCOSE  --    < > 83   < > 110*   < > 87   < >  --    < > 88 87 90  BUN  --    < > 8   < > 7   < > 10   < >  --    < > 11 12 12   CREATININE  --    < > 0.57   < >  0.49   < > 0.84   < >  --    < > 1.09* 0.92 0.92  CALCIUM  --    < > 7.1*   < > 6.8*   < > 6.4*   < >  --    < > 7.2* 7.2* 7.3*  GFRNONAA  --    < > >60   < > >60   < > >60   < >  --    < > >60 >60 >60  GFRAA  --    < > >60   < > >60   < > >60   < >  --    < > >60 >60 >60  PROT 6.4   < > 5.6*  --  5.2*  --  5.1*  --   --   --   --   --   --   ALBUMIN 2.7*   < > 1.8*   < > 1.8*  --  1.7*  --  2.2*  --   --   --  1.9*  AST 37   < > 85*  --  58*  --  87*  --   --   --   --   --   --   ALT 23   < > 33  --  23  --  28  --   --   --   --   --   --   ALKPHOS 60   < > 74  --  83  --  74  --   --   --   --   --   --   BILITOT 0.3   < > 0.3  --  0.2*  --  0.3  --   --   --   --   --   --   BILIDIR 0.10  --   --   --   --   --  0.1  --   --   --   --   --   --   IBILI  --   --   --   --   --   --  0.2*  --   --   --   --   --   --    < > = values in this interval not displayed.    Studies:  Dg Chest 2 View  Result Date: 08/05/2018 CLINICAL DATA:  Fever of unknown origin, multiple sclerosis EXAM: CHEST - 2 VIEW COMPARISON:  07/27/2018 FINDINGS: Borderline  enlargement of cardiac silhouette. Mediastinal contours and pulmonary vascularity normal. Bibasilar opacities question atelectasis versus infiltrate. Lungs otherwise clear. No pleural effusion or pneumothorax. Bones unremarkable. IMPRESSION: Bibasilar opacities question atelectasis versus infiltrate. Electronically Signed   By: Lavonia Dana M.D.   On: 08/05/2018 08:27   Dg Chest 2 View  Result Date: 07/27/2018 CLINICAL DATA:  Fever and malaise EXAM: CHEST - 2 VIEW COMPARISON:  July 16, 2018 FINDINGS: Lungs are clear. The heart size and pulmonary vascularity are normal. No adenopathy. No bone lesions. IMPRESSION: No edema or consolidation. Electronically Signed   By: Lowella Grip III M.D.   On: 07/27/2018 15:00   Ct Chest W Contrast  Result Date: 08/11/2018 CLINICAL DATA:  Fevers of unknown origin. EXAM: CT CHEST WITH CONTRAST TECHNIQUE: Multidetector CT imaging of the chest was performed during intravenous contrast administration. CONTRAST:  75 mL OMNIPAQUE IOHEXOL 300 MG/ML  SOLN COMPARISON:  CT chest, abdomen and pelvis 08/01/2018. PA and lateral chest 07/27/2010 and 08/05/2018 FINDINGS: Cardiovascular: Heart size is mildly enlarged. No pericardial effusion. No atherosclerotic vascular disease is seen. Mediastinum/Nodes: Small bilateral axillary lymph nodes have clearly visible fatty hila consistent with benignity. No mediastinal or hilar lymphadenopathy by CT size criteria. Lungs/Pleura: Small bilateral pleural effusions are not notably changed. Dependent bilateral lower lobe airspace disease is improved since the prior examination. Residual opacities have an appearance most compatible with atelectasis. Scattered ground-glass opacity seen on the comparison CT have resolved. 0.3 cm right upper lobe nodule on image 26 of series 5 is unchanged. Upper Abdomen: Status post cholecystectomy.  No acute finding. Musculoskeletal: T11 hemivertebra noted. No acute or focal abnormality. IMPRESSION: Scattered  ground-glass opacities seen on the comparison CT have resolved. Dependent lower lobe airspace disease bilaterally is improved. Residual opacities have an appearance most compatible with atelectasis rather than pneumonia. No change in small bilateral pleural effusions. No change in a 0.3 cm right upper lobe pulmonary nodule. No follow-up needed if patient is low-risk. Non-contrast chest CT can be considered in 12 months if patient is high-risk. This recommendation follows the consensus statement: Guidelines for Management of Incidental Pulmonary Nodules Detected on CT Images: From the Fleischner Society 2017; Radiology 2017; 284:228-243. Cardiomegaly. Electronically Signed   By: Drusilla Kanner M.D.   On: 08/11/2018 11:01   Ct Chest W Contrast  Result Date: 08/02/2018 CLINICAL DATA:  Fever of unknown origin. EXAM: CT CHEST, ABDOMEN, AND PELVIS WITH CONTRAST TECHNIQUE: Multidetector CT imaging of the chest, abdomen and pelvis was performed following the standard protocol during bolus administration of intravenous contrast. CONTRAST:  OMNIPAQUE IOHEXOL 300 MG/ML SOLN, 30mL OMNIPAQUE IOHEXOL 300 MG/ML SOLN COMPARISON:  None. FINDINGS: CT CHEST FINDINGS Cardiovascular: There is no large centrally located pulmonary embolus. Detection of smaller segmental and subsegmental pulmonary emboli is limited by contrast bolus timing. The main pulmonary artery is within normal limits for size. There is no CT evidence of acute right heart strain. The visualized aorta is normal. Heart size is normal. There is a trace pericardial effusion. Mediastinum/Nodes: --there are mildly prominent mediastinal and hilar lymph nodes bilaterally. --there is mild bilateral axillary adenopathy. --No supraclavicular lymphadenopathy. --Normal thyroid gland. --The esophagus is unremarkable Lungs/Pleura: There are trace bilateral pleural effusions with adjacent atelectasis. There is scattered ground-glass airspace opacities bilaterally,  greatest within the right upper lobe. There is no pneumothorax. The trachea is unremarkable. Musculoskeletal: No chest wall abnormality. No acute or significant osseous findings. 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 CT ABDOMEN PELVIS FINDINGS Hepatobiliary: The liver is borderline enlarged. Status post cholecystectomy.There is no biliary ductal dilation. Pancreas: There is some mild peripancreatic fat stranding. Spleen: There is a small wedge-shaped defect in the lower pole the spleen measuring approximately 1.8 cm (axial series 2 image 47). Adrenals/Urinary Tract: --Adrenal glands: No adrenal hemorrhage. --Right kidney/ureter: No hydronephrosis or perinephric hematoma. --Left kidney/ureter: No hydronephrosis or perinephric hematoma. --Urinary bladder: Unremarkable. Stomach/Bowel: --Stomach/Duodenum: No hiatal hernia or other gastric abnormality. Normal duodenal course and caliber. --Small bowel: No dilatation or inflammation. --Colon: No focal abnormality. --Appendix: Normal. Vascular/Lymphatic: Normal course and caliber of the major abdominal vessels. There is a retroaortic left renal vein, a normal variant. --there are prominent retroperitoneal lymph nodes. --there are prominent mesenteric lymph nodes. There is haziness throughout the mesentery. There are enlarged right lower quadrant lymph nodes measuring approximately 1.1 cm in diameter. --there are mildly enlarged pelvic and inguinal lymph nodes.  Reproductive: There is a small amount of free fluid in the pelvis which may be physiologic. There is no discrete ovarian mass. Other: There is a small volume of pelvic free fluid which is likely physiologic. No free air. There is some mild fat stranding involving the anterior abdominal wall bilaterally. Musculoskeletal. No acute displaced fractures. IMPRESSION: 1. Scattered ground-glass airspace opacities, greatest within the right upper lobe, concerning for multifocal pneumonia. 2.  Small bilateral pleural effusions with adjacent atelectasis. 3. Mild diffuse adenopathy throughout the chest, abdomen, and pelvis of unknown clinical significance. 4. Peripancreatic free fluid with edema within the root of the mesentery. Findings are nonspecific and can be seen in patients with pancreatitis, versus less likely sclerosing mesenteritis or inflammatory bowel disease. There are some prominent lymph nodes right lower quadrant which can be seen in patients with mesenteric adenitis. 5. Normal appendix in the right lower quadrant. 6. Small volume free fluid in the pelvis favored to be physiologic. 7. Wedge-shaped hypoattenuating defect in the spleen of unknown clinical significance. Differential considerations include a splenic infarct versus less likely a splenic laceration, especially in the absence of a history of trauma. There is no perisplenic hematoma. Electronically Signed   By: Katherine Mantle M.D.   On: 08/02/2018 00:18   Mr Brain Wo Contrast  Result Date: 07/28/2018 CLINICAL DATA:  Encephalopathy with hallucinations EXAM: MRI HEAD WITHOUT CONTRAST TECHNIQUE: Multiplanar, multiecho pulse sequences of the brain and surrounding structures were obtained without intravenous contrast. COMPARISON:  Brain MRI 03/24/2018 FINDINGS: BRAIN: There is no acute infarct, acute hemorrhage or extra-axial collection. The midline structures are normal. The white matter signal is normal for the patient's age. The cerebral and cerebellar volume are age-appropriate. No hydrocephalus. Susceptibility-sensitive sequences show no chronic microhemorrhage or superficial siderosis. No midline shift or other mass effect. VASCULAR: The major intracranial arterial and venous sinus flow voids are normal. SKULL AND UPPER CERVICAL SPINE: Calvarial bone marrow signal is normal. There is no skull base mass. Visualized upper cervical spine and soft tissues are normal. SINUSES/ORBITS: No fluid levels or advanced mucosal  thickening. No mastoid or middle ear effusion. The orbits are normal. IMPRESSION: Normal brain. Electronically Signed   By: Deatra Robinson M.D.   On: 07/28/2018 00:13   Mr Lumbar Spine Wo Contrast  Result Date: 08/02/2018 CLINICAL DATA:  Fever. Lower extremity weakness. History of multiple sclerosis. EXAM: MRI LUMBAR SPINE WITHOUT CONTRAST TECHNIQUE: Multiplanar, multisequence MR imaging of the lumbar spine was performed. No intravenous contrast was administered. COMPARISON:  CT abdomen pelvis from yesterday. FINDINGS: Despite efforts by the technologist and patient, motion artifact is present on today's exam and could not be eliminated. This reduces exam sensitivity and specificity. Segmentation:  Standard. Alignment:  Physiologic. Vertebrae:  No fracture, evidence of discitis, or bone lesion. Conus medullaris and cauda equina: Conus extends to the L1-L2 level. Conus and cauda equina appear normal. Paraspinal and other soft tissues: Symmetric bilateral paraspinous muscle edema. Otherwise negative. Disc levels: No significant disc bulge or herniation. No spinal canal or neuroforaminal stenosis. IMPRESSION: 1.  No acute abnormality or significant degenerative changes. Electronically Signed   By: Obie Dredge M.D.   On: 08/02/2018 09:14   Ct Abdomen Pelvis W Contrast  Result Date: 08/02/2018 CLINICAL DATA:  Fever of unknown origin. EXAM: CT CHEST, ABDOMEN, AND PELVIS WITH CONTRAST TECHNIQUE: Multidetector CT imaging of the chest, abdomen and pelvis was performed following the standard protocol during bolus administration of intravenous contrast. CONTRAST:  OMNIPAQUE IOHEXOL 300 MG/ML SOLN,  30mL OMNIPAQUE IOHEXOL 300 MG/ML SOLN COMPARISON:  None. FINDINGS: CT CHEST FINDINGS Cardiovascular: There is no large centrally located pulmonary embolus. Detection of smaller segmental and subsegmental pulmonary emboli is limited by contrast bolus timing. The main pulmonary artery is within normal limits for size.  There is no CT evidence of acute right heart strain. The visualized aorta is normal. Heart size is normal. There is a trace pericardial effusion. Mediastinum/Nodes: --there are mildly prominent mediastinal and hilar lymph nodes bilaterally. --there is mild bilateral axillary adenopathy. --No supraclavicular lymphadenopathy. --Normal thyroid gland. --The esophagus is unremarkable Lungs/Pleura: There are trace bilateral pleural effusions with adjacent atelectasis. There is scattered ground-glass airspace opacities bilaterally, greatest within the right upper lobe. There is no pneumothorax. The trachea is unremarkable. Musculoskeletal: No chest wall abnormality. No acute or significant osseous findings. 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 CT ABDOMEN PELVIS FINDINGS Hepatobiliary: The liver is borderline enlarged. Status post cholecystectomy.There is no biliary ductal dilation. Pancreas: There is some mild peripancreatic fat stranding. Spleen: There is a small wedge-shaped defect in the lower pole the spleen measuring approximately 1.8 cm (axial series 2 image 47). Adrenals/Urinary Tract: --Adrenal glands: No adrenal hemorrhage. --Right kidney/ureter: No hydronephrosis or perinephric hematoma. --Left kidney/ureter: No hydronephrosis or perinephric hematoma. --Urinary bladder: Unremarkable. Stomach/Bowel: --Stomach/Duodenum: No hiatal hernia or other gastric abnormality. Normal duodenal course and caliber. --Small bowel: No dilatation or inflammation. --Colon: No focal abnormality. --Appendix: Normal. Vascular/Lymphatic: Normal course and caliber of the major abdominal vessels. There is a retroaortic left renal vein, a normal variant. --there are prominent retroperitoneal lymph nodes. --there are prominent mesenteric lymph nodes. There is haziness throughout the mesentery. There are enlarged right lower quadrant lymph nodes measuring approximately 1.1 cm in diameter. --there are mildly  enlarged pelvic and inguinal lymph nodes. Reproductive: There is a small amount of free fluid in the pelvis which may be physiologic. There is no discrete ovarian mass. Other: There is a small volume of pelvic free fluid which is likely physiologic. No free air. There is some mild fat stranding involving the anterior abdominal wall bilaterally. Musculoskeletal. No acute displaced fractures. IMPRESSION: 1. Scattered ground-glass airspace opacities, greatest within the right upper lobe, concerning for multifocal pneumonia. 2. Small bilateral pleural effusions with adjacent atelectasis. 3. Mild diffuse adenopathy throughout the chest, abdomen, and pelvis of unknown clinical significance. 4. Peripancreatic free fluid with edema within the root of the mesentery. Findings are nonspecific and can be seen in patients with pancreatitis, versus less likely sclerosing mesenteritis or inflammatory bowel disease. There are some prominent lymph nodes right lower quadrant which can be seen in patients with mesenteric adenitis. 5. Normal appendix in the right lower quadrant. 6. Small volume free fluid in the pelvis favored to be physiologic. 7. Wedge-shaped hypoattenuating defect in the spleen of unknown clinical significance. Differential considerations include a splenic infarct versus less likely a splenic laceration, especially in the absence of a history of trauma. There is no perisplenic hematoma. Electronically Signed   By: Katherine Mantle M.D.   On: 08/02/2018 00:18   Ct Maxillofacial W Contrast  Result Date: 08/08/2018 CLINICAL DATA:  Fevers.  Right mandibular molar tooth pain. EXAM: CT MAXILLOFACIAL WITH CONTRAST TECHNIQUE: Multidetector CT imaging of the maxillofacial structures was performed with intravenous contrast. Multiplanar CT image reconstructions were also generated. CONTRAST:  75mL OMNIPAQUE IOHEXOL 300 MG/ML  SOLN COMPARISON:  MRI 07/27/2018 FINDINGS: Osseous: No maxillary, mid face or upper face  abnormality is seen. There is  no evidence of active dental or periodontal disease. With respect to the right mandible, teeth 25 through 31 appear normal. Tooth 32 is absent and presumably has been extracted in the distant past. There is a benign appearing focus of hazy sclerosis measuring about 1.5 cm at the angle of the mandible on the right that could represent sequela of the previous extraction. If the patient has not had extraction of tooth 32, then this could be some dysplastic change related to aplasia of the tooth. I do not see any likely significant finding in this region. Left mandibular molar remains in place, not fully erupted. Orbits: Normal Sinuses: Clear Soft tissues: Normal. No soft tissue inflammatory changes are evident. Limited intracranial: Normal IMPRESSION: No evidence of dental or periodontal decay or inflammatory disease. Absent right mandibular molar tooth. Presumably there has been distant extraction. Hazy sclerosis within the mandible at the angle of the mandible could be chronic healed sequela of the molar tooth extraction. If there is not a history of molar tooth extraction, then there could be aplasia/hypoplasia of tooth 32. Electronically Signed   By: Paulina FusiMark  Shogry M.D.   On: 08/08/2018 11:49   Dg Chest Port 1 View  Result Date: 07/16/2018 CLINICAL DATA:  Fatigue EXAM: PORTABLE CHEST 1 VIEW COMPARISON:  None. FINDINGS: The heart size and mediastinal contours are within normal limits. Both lungs are clear. The visualized skeletal structures are unremarkable. IMPRESSION: No active disease. Electronically Signed   By: Alcide CleverMark  Lukens M.D.   On: 07/16/2018 15:18   [place] Lower Extremity Venous (dvt)  Result Date: 08/06/2018  Lower Venous Study Indications: Immobility, fever.  Limitations: Patient positioning. Comparison Study: No prior Performing Technologist: Blanch MediaMegan Riddle RVS  Examination Guidelines: A complete evaluation includes B-mode imaging, spectral Doppler, color Doppler, and  power Doppler as needed of all accessible portions of each vessel. Bilateral testing is considered an integral part of a complete examination. Limited examinations for reoccurring indications may be performed as noted.  +---------+---------------+---------+-----------+----------+--------------+ RIGHT    CompressibilityPhasicitySpontaneityPropertiesSummary        +---------+---------------+---------+-----------+----------+--------------+ CFV      Full           Yes      Yes                                 +---------+---------------+---------+-----------+----------+--------------+ SFJ      Full                                                        +---------+---------------+---------+-----------+----------+--------------+ FV Prox  Full                                                        +---------+---------------+---------+-----------+----------+--------------+ FV Mid   Full                                                        +---------+---------------+---------+-----------+----------+--------------+ FV DistalFull                                                        +---------+---------------+---------+-----------+----------+--------------+  PFV      Full                                                        +---------+---------------+---------+-----------+----------+--------------+ POP      Full           Yes      Yes                                 +---------+---------------+---------+-----------+----------+--------------+ PTV      Full                                                        +---------+---------------+---------+-----------+----------+--------------+ PERO                                                  Not visualized +---------+---------------+---------+-----------+----------+--------------+   +---------+---------------+---------+-----------+----------+--------------+ LEFT      CompressibilityPhasicitySpontaneityPropertiesSummary        +---------+---------------+---------+-----------+----------+--------------+ CFV      Full           Yes      Yes                                 +---------+---------------+---------+-----------+----------+--------------+ SFJ      Full                                                        +---------+---------------+---------+-----------+----------+--------------+ FV Prox  Full                                                        +---------+---------------+---------+-----------+----------+--------------+ FV Mid   Full                                                        +---------+---------------+---------+-----------+----------+--------------+ FV DistalFull                                                        +---------+---------------+---------+-----------+----------+--------------+ PFV      Full                                                        +---------+---------------+---------+-----------+----------+--------------+  POP      Full           Yes      Yes                                 +---------+---------------+---------+-----------+----------+--------------+ PTV      Full                                                        +---------+---------------+---------+-----------+----------+--------------+ PERO                                                  Not visualized +---------+---------------+---------+-----------+----------+--------------+     Summary: Right: There is no evidence of deep vein thrombosis in the lower extremity. No cystic structure found in the popliteal fossa. Left: There is no evidence of deep vein thrombosis in the lower extremity. No cystic structure found in the popliteal fossa.  *See table(s) above for measurements and observations. Electronically signed by Lemar Livings MD on 08/06/2018 at 11:00:59 AM.    Final

## 2018-08-15 LAB — CBC
HCT: 25.5 % — ABNORMAL LOW (ref 36.0–46.0)
Hemoglobin: 7.5 g/dL — ABNORMAL LOW (ref 12.0–15.0)
MCH: 27.5 pg (ref 26.0–34.0)
MCHC: 29.4 g/dL — ABNORMAL LOW (ref 30.0–36.0)
MCV: 93.4 fL (ref 80.0–100.0)
Platelets: 118 10*3/uL — ABNORMAL LOW (ref 150–400)
RBC: 2.73 MIL/uL — ABNORMAL LOW (ref 3.87–5.11)
RDW: 20.5 % — ABNORMAL HIGH (ref 11.5–15.5)
WBC: 2 10*3/uL — ABNORMAL LOW (ref 4.0–10.5)
nRBC: 0 % (ref 0.0–0.2)

## 2018-08-15 LAB — BASIC METABOLIC PANEL
Anion gap: 7 (ref 5–15)
BUN: 15 mg/dL (ref 6–20)
CO2: 18 mmol/L — ABNORMAL LOW (ref 22–32)
Calcium: 7.4 mg/dL — ABNORMAL LOW (ref 8.9–10.3)
Chloride: 123 mmol/L — ABNORMAL HIGH (ref 98–111)
Creatinine, Ser: 0.85 mg/dL (ref 0.44–1.00)
GFR calc Af Amer: >60 mL/min (ref >60)
GFR calc non Af Amer: >60 mL/min (ref >60)
Glucose, Bld: 115 mg/dL — ABNORMAL HIGH (ref 70–99)
Potassium: 4 mmol/L (ref 3.5–5.1)
Sodium: 148 mmol/L — ABNORMAL HIGH (ref 135–145)

## 2018-08-15 NOTE — Progress Notes (Signed)
Bladder scan's result was 364ml. Patient stated that she would use a bedpan

## 2018-08-15 NOTE — Progress Notes (Signed)
PROGRESS NOTE    Emily Hicks   XBM:[id number]  DOB: 29-Oct-1985  DOA: 07/27/2018 PCP: [name], MD   Brief Narrative:  Emily Hicks is an 33 y.o.femalewith medical history significant ofmultiple sclerosis, depression who was brought by her husband to the emergency department for the evaluation of altered mental status, fever, generalized weakness.For her MS, she was on Aubagio until a week ago due to intolerance. She presents with Confusion, weakness, loss of appetite,sepsis.Found to have severe oropharyngeal thrush with odynophagia.    Fever recurred on 08/03/2018.      Subjective: She is feeling better today but cannot say for sure why she is feeling better.   Assessment & Plan:   Principal Problem:   Sepsis - source still not determined- she has oropharyngeal candidiasis but this does not routinely cause fevers - ID consulted and extensive work up ordered  - 6/28- CT chest Abd/ Pelvis> scattered ground-glass airspace opacities bilaterally, greatest within the right upper lobe, mild diffuse adenopathy in chest, abdomen and pelvis, peripancreatic free fluid - the patient was on various different antibiotics early on during the hospital stay (Vanc, Ceftriaxone, Zosyn) but antibiotics stopped on 7/1 by ID and recommendation made to avoid aniboitics - 2 d ECHO > no endocarditis - Blood cultures- contaminant noted on 7/1 cultures> coag neg staph in 1 set - subsequently, blood cultures have been negative - c diff neg - Maxillofacial CT unrevealing - elevated CMV IgM but CMV PCR negative - CT chest repeated today shows that lung opacities have resolved - ANA is positive 1:1280- will order a reflex panel - of note a reflex panel was done in Feb of this year and she was noted to have a positive anti dsDNA, anti-smith, anti-RNP, Anti-SSA suggesting a diagnosis of SLE - C3 and C4 noted to be low consistent with autoimmune disorder -  reflex panel today consistent with SLE-  will start IV steroids and follow -   steroids started yesterday- she is improving - lips are slightly less swollen  Active Problems:   Candida esophagitis - cont Diflucan - improving  Leukopenia, anemia, mild thrombocytopenia - based on past blood work, it appears she has been slighly leukopenic since 03/31/18 (WBC 3.4 at that time) - WBC drops as low as 1.3- ? If secondary to Aubagio - baseline Hb ~ 12-13- now is ~ 7-8 range - appreciate oncology eval- Dr Ammie Dalton would like to have a bone marrow biopsy performed but the patient is hesitant - SPEP negative  - she continues to refuse a bone marrow biopsy due to fear of pain - WBC sightly better today- follow  Swelling of lips - likely due to autoimmune process -  added Benardryl PRN & Pepcid BID but these have not helped    MS (multiple sclerosis)   - recent diagnosis - she stopped taking Aubagio about 1 wk prior to admission as she was not feeling well - MRI on 6/29- shows no acute findings  Hypernatremia/ dehydration/ slightly elevated Cr on admisson - has resolved  Hypokalemia, Hypophosphatemia, Hypomagnesemia - actively being replaced- likely due to inability to take adequate oral intake     Mildly elevated LFTs - borderline enlarged liver noted on CT- follow  Severe protein calorie malnutrition - cont dietary supplements- she is very malnourished  Severe weakness - due to acute illness with underlying MS - may need to go to CIR once clinically improved  Morbid obesity Body mass index is 37.07 kg/m.   Depression -  resume Cymbalta and Remeron   Time spent in minutes: 30 min  DVT prophylaxis: Lovenox Code Status: Full code Family Communication: husband Disposition Plan: PT recommends CIR but patient would like to go home- plan to increase mobility today- dicussed with RN Consultants:   ID  GI  Phone consult with rheumatology Procedures:   none Antimicrobials:  Anti-infectives (From admission, onward)    Start     Dose/Rate Route Frequency Ordered Stop   08/14/18 0859  fluconazole (DIFLUCAN) tablet 100 mg  Status:  Discontinued     100 mg Oral Weekly 08/07/18 1128 08/13/18 1037   08/13/18 1200  fluconazole (DIFLUCAN) tablet 100 mg     100 mg Oral Daily 08/13/18 1037     08/08/18 0815  amoxicillin (AMOXIL) capsule 500 mg  Status:  Discontinued     500 mg Oral Every 8 hours 08/08/18 0745 08/08/18 1409   08/06/18 1000  fluconazole (DIFLUCAN) tablet 100 mg  Status:  Discontinued     100 mg Oral Daily 08/05/18 1643 08/07/18 1128   08/04/18 2200  piperacillin-tazobactam (ZOSYN) IVPB 3.375 g  Status:  Discontinued     3.375 g 12.5 mL/hr over 240 Minutes Intravenous Every 8 hours 08/04/18 2156 08/05/18 1643   08/03/18 1000  fluconazole (DIFLUCAN) tablet 200 mg  Status:  Discontinued     200 mg Oral Daily 08/02/18 1500 08/05/18 1643   07/28/18 1700  vancomycin (VANCOCIN) IVPB 750 mg/150 ml premix  Status:  Discontinued     750 mg 150 mL/hr over 60 Minutes Intravenous Every 24 hours 07/27/18 1606 07/28/18 0947   07/28/18 1000  fluconazole (DIFLUCAN) IVPB 200 mg  Status:  Discontinued     200 mg 100 mL/hr over 60 Minutes Intravenous Every 24 hours 07/28/18 0954 08/02/18 1500   07/27/18 1800  ceFEPIme (MAXIPIME) 2 g in sodium chloride 0.9 % 100 mL IVPB  Status:  Discontinued     2 g 200 mL/hr over 30 Minutes Intravenous Every 8 hours 07/27/18 1606 08/01/18 1414   07/27/18 1730  fluconazole (DIFLUCAN) tablet 200 mg  Status:  Discontinued     200 mg Oral  Once 07/27/18 1717 07/28/18 0953   07/27/18 1615  vancomycin (VANCOCIN) 1,500 mg in sodium chloride 0.9 % 500 mL IVPB     1,500 mg 250 mL/hr over 120 Minutes Intravenous  Once 07/27/18 1606 07/27/18 1851   07/27/18 1415  cefTRIAXone (ROCEPHIN) 1 g in sodium chloride 0.9 % 100 mL IVPB     1 g 200 mL/hr over 30 Minutes Intravenous  Once 07/27/18 1404 07/27/18 1642       Objective: Vitals:   08/14/18 2021 08/14/18 2127 08/15/18 0445 08/15/18  0942  BP: 125/84 125/85 (!) 123/96 125/88  Pulse: (!) 121 (!) 119 (!) 112 (!) 110  Resp: '18 20 20   ' Temp: 99.2 F (37.3 C) 97.9 F (36.6 C) 98.4 F (36.9 C)   TempSrc: Oral Oral Oral   SpO2: 95% 92% 94%   Weight:      Height:        Intake/Output Summary (Last 24 hours) at 08/15/2018 1109 Last data filed at 08/15/2018 0500 Gross per 24 hour  Intake 288.21 ml  Output 650 ml  Net -361.79 ml   Filed Weights   07/27/18 1745 07/31/18 0300  Weight: 83.3 kg 89 kg    Examination: General exam: Appears comfortable  HEENT: PERRLA, oral mucosa moist, no sclera icterus or thrush- lips slightly less swollen today Respiratory system: Clear  to auscultation. Respiratory effort normal. Cardiovascular system: S1 & S2 heard,  No murmurs  Gastrointestinal system: Abdomen soft, non-tender, nondistended. Normal bowel sounds   Central nervous system: Alert and oriented. No focal neurological deficits. Extremities: No cyanosis, clubbing or edema Skin: No rashes or ulcers Psychiatry:  flat affect    Data Reviewed: I have personally reviewed following labs and imaging studies  CBC: Recent Labs  Lab 08/10/18 1209 08/11/18 0441 08/12/18 1026 08/13/18 1243 08/14/18 0546 08/15/18 0509  WBC 1.1* 1.3* 1.5* 1.5* 1.5* 2.0*  NEUTROABS 0.7* 0.7* 0.9* 0.9* 0.9*  --   HGB 7.8* 8.1* 7.8* 7.8* 7.3* 7.5*  HCT 26.0* 26.4* 26.2* 25.7* 24.6* 25.5*  MCV 92.5 93.3 91.9 93.1 92.5 93.4  PLT 155 178 121* 115* 125*  117* 488*   Basic Metabolic Panel: Recent Labs  Lab 08/09/18 0947 08/10/18 1209 08/11/18 0441 08/12/18 1026 08/13/18 0354 08/15/18 0509  NA 140 141 141 143 146* 148*  K 3.5 3.5 4.3 3.5 3.9 4.0  CL 117* 118* 120* 118* 123* 123*  CO2 16* 15* 18* 16* 16* 18*  GLUCOSE 81 88 88 87 90 115*  BUN '13 12 11 12 12 15  ' CREATININE 1.08* 1.06* 1.09* 0.92 0.92 0.85  CALCIUM 7.2* 7.2* 7.2* 7.2* 7.3* 7.4*  MG 2.1  --   --  1.4*  --   --   PHOS 2.7  --   --  1.7* 1.5*  --    GFR: Estimated  Creatinine Clearance: 96.5 mL/min (by C-G formula based on SCr of 0.85 mg/dL). Liver Function Tests: Recent Labs  Lab 08/09/18 1912 08/13/18 0354  ALBUMIN 2.2* 1.9*   No results for input(s): LIPASE, AMYLASE in the last 168 hours. No results for input(s): AMMONIA in the last 168 hours. Coagulation Profile: Recent Labs  Lab 08/14/18 0546  INR 1.3*   Cardiac Enzymes: No results for input(s): CKTOTAL, CKMB, CKMBINDEX, TROPONINI in the last 168 hours. BNP (last 3 results) No results for input(s): PROBNP in the last 8760 hours. HbA1C: No results for input(s): HGBA1C in the last 72 hours. CBG: No results for input(s): GLUCAP in the last 168 hours. Lipid Profile: No results for input(s): CHOL, HDL, LDLCALC, TRIG, CHOLHDL, LDLDIRECT in the last 72 hours. Thyroid Function Tests: No results for input(s): TSH, T4TOTAL, FREET4, T3FREE, THYROIDAB in the last 72 hours. Anemia Panel: No results for input(s): VITAMINB12, FOLATE, FERRITIN, TIBC, IRON, RETICCTPCT in the last 72 hours. Urine analysis:    Component Value Date/Time   COLORURINE YELLOW 08/04/2018 2135   APPEARANCEUR HAZY (A) 08/04/2018 2135   LABSPEC 1.018 08/04/2018 2135   PHURINE 6.0 08/04/2018 2135   GLUCOSEU NEGATIVE 08/04/2018 2135   HGBUR NEGATIVE 08/04/2018 2135   BILIRUBINUR NEGATIVE 08/04/2018 2135   BILIRUBINUR Neg 05/08/2010 1553   KETONESUR NEGATIVE 08/04/2018 2135   PROTEINUR 100 (A) 08/04/2018 2135   UROBILINOGEN 1.0 11/11/2013 0150   NITRITE NEGATIVE 08/04/2018 2135   LEUKOCYTESUR NEGATIVE 08/04/2018 2135   Sepsis Labs: '@LABRCNTIP' (procalcitonin:4,lacticidven:4) ) Recent Results (from the past 240 hour(s))  Culture, blood (routine x 2)     Status: None   Collection Time: 08/05/18  6:59 PM   Specimen: BLOOD  Result Value Ref Range Status   Specimen Description   Final    BLOOD LEFT HAND Performed at [facility], [place] [address] [place], [place] [address]    Special Requests    Final    BOTTLES DRAWN AEROBIC ONLY Blood Culture adequate volume Performed at  [facility], [place] [address] [place], [place] [address]    Culture   Final    NO GROWTH 5 DAYS Performed at [facility], [place] [address] [place], [place] [address]    Report Status 08/10/2018 FINAL  Final  C difficile quick scan w PCR reflex     Status: None   Collection Time: 08/07/18 11:29 AM   Specimen: STOOL  Result Value Ref Range Status   C Diff antigen NEGATIVE NEGATIVE Final   C Diff toxin NEGATIVE NEGATIVE Final   C Diff interpretation No C. difficile detected.  Final    Comment: Performed at [facility], [place] [address] [place], [place] [address]  Culture, blood (Routine X 2) w Reflex to ID Panel     Status: None   Collection Time: 08/09/18  7:12 PM   Specimen: BLOOD LEFT HAND  Result Value Ref Range Status   Specimen Description   Final    BLOOD LEFT HAND Performed at [place] [address], [place], [place] [address]    Special Requests   Final    BOTTLES DRAWN AEROBIC ONLY Blood Culture adequate volume Performed at [place] [address], [place], [place] [address]    Culture   Final    NO GROWTH 5 DAYS Performed at [facility], [place] [address] [place], [place] [address]    Report Status 08/14/2018 FINAL  Final  Culture, blood (Routine X 2) w Reflex to ID Panel     Status: None   Collection Time: 08/09/18  7:12 PM   Specimen: BLOOD LEFT HAND  Result Value Ref Range Status   Specimen Description   Final    BLOOD LEFT HAND Performed at [place] [address] [place], [place] [address]    Special Requests   Final    BOTTLES DRAWN AEROBIC ONLY Blood Culture adequate volume Performed at [place] [address] [place], [place] [address]    Culture   Final    NO GROWTH 5 DAYS Performed at [facility], [place] [address]  [place], [place] [address]    Report Status 08/14/2018 FINAL  Final  Culture, blood (routine x 2)     Status: None (Preliminary result)   Collection Time: 08/11/18  2:59 PM   Specimen: BLOOD  Result Value Ref Range Status   Specimen Description   Final    BLOOD LEFT ANTECUBITAL Performed at [place] [address] [place], [place] [address]    Special Requests   Final    BOTTLES DRAWN AEROBIC ONLY Blood Culture adequate volume Performed at [place] [address] [place], [place] [address]    Culture   Final    NO GROWTH 3 DAYS Performed at [facility], [place] [address] [place], [place] [address]    Report Status PENDING  Incomplete  Culture, blood (routine x 2)     Status: None (Preliminary result)   Collection Time: 08/11/18  2:59 PM   Specimen: BLOOD LEFT HAND  Result Value Ref Range Status   Specimen Description   Final    BLOOD LEFT HAND Performed at [place] [address] [place], [place] [address]    Special Requests   Final    BOTTLES DRAWN AEROBIC ONLY Blood Culture adequate volume Performed at [place] [address] [place],  [place] [address]    Culture   Final    NO GROWTH 3 DAYS Performed at [facility], [place] [address] [place], [place] [address]    Report Status PENDING  Incomplete         Radiology Studies: No results found.    Scheduled Meds: . DULoxetine  90 mg Oral Daily  . enoxaparin (LOVENOX) injection  40 mg Subcutaneous Q24H  . feeding supplement  1 Container Oral Q24H  . feeding supplement (ENSURE ENLIVE)  237 mL Oral Q24H  . feeding supplement (PRO-STAT SUGAR FREE 64)  30 mL Oral BID  . fluconazole  100 mg Oral Daily  . folic acid  2 mg Oral Daily  . free water  300 mL Oral Q8H  . hydrocortisone  25 mg Rectal BID  . magnesium oxide  400 mg Oral BID  . methylPREDNISolone (SOLU-MEDROL) injection  40 mg Intravenous Q12H  .  metoprolol tartrate  25 mg Oral BID  . multivitamin with minerals  1 tablet Oral Daily  . nystatin  5 mL Oral QID  . phosphorus  500 mg Oral TID  . sodium chloride flush  3 mL Intravenous Once  . traZODone  50 mg Oral QHS   Continuous Infusions: . famotidine (PEPCID) IV 20 mg (08/15/18 0946)     LOS: 19 days      Debbe Odea, MD [facility] Pager: [email or web address] [facility] 08/15/2018, 11:09 AM

## 2018-08-16 DIAGNOSIS — R6510 Systemic inflammatory response syndrome (SIRS) of non-infectious origin without acute organ dysfunction: Secondary | ICD-10-CM

## 2018-08-16 DIAGNOSIS — D61818 Other pancytopenia: Secondary | ICD-10-CM

## 2018-08-16 DIAGNOSIS — Z6841 Body Mass Index (BMI) 40.0 and over, adult: Secondary | ICD-10-CM

## 2018-08-16 DIAGNOSIS — M329 Systemic lupus erythematosus, unspecified: Secondary | ICD-10-CM

## 2018-08-16 LAB — BASIC METABOLIC PANEL
Anion gap: 12 (ref 5–15)
BUN: 26 mg/dL — ABNORMAL HIGH (ref 6–20)
CO2: 15 mmol/L — ABNORMAL LOW (ref 22–32)
Calcium: 7.5 mg/dL — ABNORMAL LOW (ref 8.9–10.3)
Chloride: 122 mmol/L — ABNORMAL HIGH (ref 98–111)
Creatinine, Ser: 1.06 mg/dL — ABNORMAL HIGH (ref 0.44–1.00)
GFR calc Af Amer: >60 mL/min (ref >60)
GFR calc non Af Amer: >60 mL/min (ref >60)
Glucose, Bld: 118 mg/dL — ABNORMAL HIGH (ref 70–99)
Potassium: 3.6 mmol/L (ref 3.5–5.1)
Sodium: 149 mmol/L — ABNORMAL HIGH (ref 135–145)

## 2018-08-16 LAB — CULTURE, BLOOD (ROUTINE X 2)
Culture: NO GROWTH
Culture: NO GROWTH
Special Requests: ADEQUATE
Special Requests: ADEQUATE

## 2018-08-16 LAB — MAGNESIUM: Magnesium: 1.8 mg/dL (ref 1.7–2.4)

## 2018-08-16 LAB — CBC
HCT: 25.6 % — ABNORMAL LOW (ref 36.0–46.0)
Hemoglobin: 7.5 g/dL — ABNORMAL LOW (ref 12.0–15.0)
MCH: 28.1 pg (ref 26.0–34.0)
MCHC: 29.3 g/dL — ABNORMAL LOW (ref 30.0–36.0)
MCV: 95.9 fL (ref 80.0–100.0)
Platelets: 134 10*3/uL — ABNORMAL LOW (ref 150–400)
RBC: 2.67 MIL/uL — ABNORMAL LOW (ref 3.87–5.11)
RDW: 21 % — ABNORMAL HIGH (ref 11.5–15.5)
WBC: 3.2 10*3/uL — ABNORMAL LOW (ref 4.0–10.5)
nRBC: 1.2 % — ABNORMAL HIGH (ref 0.0–0.2)

## 2018-08-16 LAB — PHOSPHORUS: Phosphorus: 2.2 mg/dL — ABNORMAL LOW (ref 2.5–4.6)

## 2018-08-16 MED ORDER — TRAZODONE HCL 50 MG PO TABS: 50.0000 mg | ORAL_TABLET | Freq: Every day | ORAL | 0 refills | Status: DC

## 2018-08-16 MED ORDER — PREDNISONE 10 MG PO TABS: 20.0000 mg | ORAL_TABLET | Freq: Every day | ORAL | 0 refills | Status: DC

## 2018-08-16 MED ORDER — FOLIC ACID 1 MG PO TABS: 2.0000 mg | ORAL_TABLET | Freq: Every day | ORAL | 0 refills | Status: DC

## 2018-08-16 MED ORDER — ENSURE ENLIVE PO LIQD: 237.0000 mL | ORAL | 12 refills | Status: DC

## 2018-08-16 MED ORDER — SODIUM CHLORIDE 0.9 % IV BOLUS
500.0000 mL | Freq: Once | INTRAVENOUS | Status: AC
  Administered 2018-08-16: 500 mL via INTRAVENOUS

## 2018-08-16 MED ORDER — FLUCONAZOLE 100 MG PO TABS: 100.0000 mg | ORAL_TABLET | Freq: Every day | ORAL | 0 refills | Status: DC

## 2018-08-16 MED ORDER — PREDNISONE 10 MG PO TABS: 40.0000 mg | ORAL_TABLET | Freq: Every day | ORAL | 0 refills | Status: DC

## 2018-08-16 MED ORDER — PREDNISONE 20 MG PO TABS: ORAL_TABLET | ORAL | Status: DC

## 2018-08-16 MED ORDER — NYSTATIN 100000 UNIT/ML MT SUSP: 5.0000 mL | Freq: Four times a day (QID) | OROMUCOSAL | 0 refills | Status: DC

## 2018-08-16 MED ORDER — DULOXETINE HCL 30 MG PO CPEP: 90.0000 mg | ORAL_CAPSULE | Freq: Every day | ORAL | 0 refills | Status: DC

## 2018-08-16 MED ORDER — ADULT MULTIVITAMIN W/MINERALS CH: 1.0000 | ORAL_TABLET | Freq: Every day | ORAL | 0 refills | Status: DC

## 2018-08-16 MED ORDER — METOPROLOL TARTRATE 25 MG PO TABS: 25.0000 mg | ORAL_TABLET | Freq: Two times a day (BID) | ORAL | 0 refills | Status: DC

## 2018-08-16 MED ORDER — PRO-STAT SUGAR FREE PO LIQD: 30.0000 mL | Freq: Two times a day (BID) | ORAL | 0 refills | Status: DC

## 2018-08-16 MED ORDER — MAGNESIUM OXIDE 400 (241.3 MG) MG PO TABS: 400.0000 mg | ORAL_TABLET | Freq: Two times a day (BID) | ORAL | 0 refills | Status: DC

## 2018-08-16 NOTE — Progress Notes (Signed)
Occupational Therapy Treatment Patient Details Name: Emily Hicks MRN: [id number] DOB: 12/05/1985 Today's Date: 08/16/2018    History of present illness This 33 year old female was admitted for progressive weakness and AMS.  She was recently dxd with MS   OT comments  This 33 yo female admitted with above presents to acute OT making good progress towards goals and now wants to go home instead of CIR (updates in charting made accordingly and made MD aware of [place] order and DME needs). Pt still remains with very flat affect and decreased muscle control in LEs; however is progressing with mobility and ADLs. She will continue to benefit from [facility]   Follow Up Recommendations  Home health OT;Supervision/Assistance - 24 hour    Equipment Recommendations  3 in 1 bedside commode       Precautions / Restrictions Precautions Precautions: Fall Precaution Comments: monitor HR Restrictions Weight Bearing Restrictions: No       Mobility Bed Mobility Overal bed mobility: Needs Assistance Bed Mobility: Supine to Sit     Supine to sit: Supervision;HOB elevated     General bed mobility comments: Pt up in recliner upon arrival  Transfers Overall transfer level: Needs assistance Equipment used: Rolling walker (2 wheeled) Transfers: Sit to/from Stand Sit to Stand: Min assist         General transfer comment: VCs for hand placement    Balance Overall balance assessment: Needs assistance Sitting-balance support: No upper extremity supported;Feet supported Sitting balance-Leahy Scale: Fair     Standing balance support: Bilateral upper extremity supported;During functional activity   Standing balance comment: propped on forearms while standing at sink to wash hands                           ADL either performed or assessed with clinical judgement   ADL Overall ADL's : Needs assistance/impaired     Grooming: Min guard;Standing               Lower Body Dressing:  Minimal assistance;Sit to/from stand Lower Body Dressing Details (indicate cue type and reason): Pt has use her pant legs to get her legs crossed but once she gets them crossed she can doff and donn her socks.   Toilet Transfer Details (indicate cue type and reason): Max A sit<>stand from standard toilet with grab bar, min A from 3n1   Toileting - Clothing Manipulation Details (indicate cue type and reason): independent sitting on [employer] Details (indicate cue type and reason): We discussed that she will need a tub bench at home not a tub seat (I gave her a handout)   General ADL Comments: We talked about with her new diagnosis of MS that she needs to avoid getting hot--that this will make her feel more weak than she already is. Gave pt gait belt for home     Vision Patient Visual Report: No change from baseline            Cognition Arousal/Alertness: Awake/alert Behavior During Therapy: Flat affect Overall Cognitive Status: Within Functional Limits for tasks assessed                                 General Comments: slow to process/initiate commands; flat affect              General Comments Pt has trouble with control with stand>sit (not strong  co-contraction so she plops down once she hits a certain stance)    Pertinent Vitals/ Pain       Pain Assessment: No/denies pain         Frequency  Min 2X/week        Progress Toward Goals  OT Goals(current goals can now be found in the care plan section)  Progress towards OT goals: Progressing toward goals  Acute Rehab OT Goals Patient Stated Goal: to go home today         AM-PAC OT "6 Clicks" Daily Activity     Outcome Measure   Help from another person eating meals?: None Help from another person taking care of personal grooming?: A Little Help from another person toileting, which includes using toliet, bedpan, or urinal?: A Little Help from another person bathing (including  washing, rinsing, drying)?: A Little Help from another person to put on and taking off regular upper body clothing?: A Little Help from another person to put on and taking off regular lower body clothing?: A Little 6 Click Score: 19    End of Session Equipment Utilized During Treatment: Gait belt;Rolling walker  OT Visit Diagnosis: Muscle weakness (generalized) (M62.81);Other abnormalities of gait and mobility (R26.89)   Activity Tolerance Patient tolerated treatment well   Patient Left in chair;with call bell/phone within reach;with chair alarm set   Nurse Communication (I am going to chat text Dr. Butler [place] about [facility] and DME)        Time: 4163-8453 OT Time Calculation (min): 22 min  Charges: OT General Charges $OT Visit: 1 Visit OT Treatments $Self Care/Home Management : 8-22 mins  Ignacia Palma, OTR/L Acute Rehab Services Pager [phone] Office [phone]      Evette Georges 08/16/2018, 1:50 PM

## 2018-08-16 NOTE — Progress Notes (Addendum)
HEMATOLOGY-ONCOLOGY PROGRESS NOTE  SUBJECTIVE: Has remained afebrile over the past 24 hours.  Denies chills.  Lips are less swollen today.  No bleeding.  No shortness of breath.  REVIEW OF SYSTEMS:   Constitutional: No fevers or chills.  Respiratory: Denies cough, dyspnea or wheezes Cardiovascular: Denies palpitation, chest discomfort Gastrointestinal:  Denies nausea, vomiting.  No constipation or diarrhea. Skin: Denies abnormal skin rashes Lymphatics: Denies new lymphadenopathy or easy bruising Neurological:Denies numbness, tingling or new weaknesses Behavioral/Psych: Mood is stable, no new changes  Extremities: No lower extremity edema All other systems were reviewed with the patient and are negative.  I have reviewed the past medical history, past surgical history, social history and family history with the patient and they are unchanged from previous note.   PHYSICAL EXAMINATION:  Vitals:   08/15/18 2109 08/16/18 0510  BP: (!) 128/96 (!) 123/96  Pulse: (!) 104 91  Resp: 16 16  Temp: 97.9 F (36.6 C) 97.8 F (36.6 C)  SpO2: 97% 98%   Filed Weights   07/27/18 1745 07/31/18 0300  Weight: 183 lb 10.3 oz (83.3 kg) 196 lb 3.4 oz (89 kg)    Intake/Output from previous day: 07/12 0701 - 07/13 0700 In: 300 [P.O.:300] Out: 200 [Urine:200]  GENERAL:alert, no distress  SKIN: no rashes or significant lesions OROPHARYNX: Swollen lips, improved. No thrush.  LUNGS: clear to auscultation and percussion with normal breathing effort HEART: Tachycardic. Regular rhythm.  ABDOMEN:abdomen soft, non-tender and normal bowel sounds NEURO: alert & oriented x 3 with fluent speech, no focal motor/sensory deficits  LABORATORY DATA:  I have reviewed the data as listed CMP Latest Ref Rng & Units 08/16/2018 08/15/2018 08/13/2018  Glucose 70 - 99 mg/dL 416(S) 063(K) 90  BUN 6 - 20 mg/dL 16(W) 15 12  Creatinine 0.44 - 1.00 mg/dL 1.09(N) 2.35 5.73  Sodium 135 - 145 mmol/L 149(H) 148(H) 146(H)   Potassium 3.5 - 5.1 mmol/L 3.6 4.0 3.9  Chloride 98 - 111 mmol/L 122(H) 123(H) 123(H)  CO2 22 - 32 mmol/L 15(L) 18(L) 16(L)  Calcium 8.9 - 10.3 mg/dL 7.5(L) 7.4(L) 7.3(L)  Total Protein 6.5 - 8.1 g/dL - - -  Total Bilirubin 0.3 - 1.2 mg/dL - - -  Alkaline Phos 38 - 126 U/L - - -  AST 15 - 41 U/L - - -  ALT 0 - 44 U/L - - -    Lab Results  Component Value Date   WBC 3.2 (L) 08/16/2018   HGB 7.5 (L) 08/16/2018   HCT 25.6 (L) 08/16/2018   MCV 95.9 08/16/2018   PLT 134 (L) 08/16/2018   NEUTROABS 0.9 (L) 08/14/2018    Dg Chest 2 View  Result Date: 08/05/2018 CLINICAL DATA:  Fever of unknown origin, multiple sclerosis EXAM: CHEST - 2 VIEW COMPARISON:  07/27/2018 FINDINGS: Borderline enlargement of cardiac silhouette. Mediastinal contours and pulmonary vascularity normal. Bibasilar opacities question atelectasis versus infiltrate. Lungs otherwise clear. No pleural effusion or pneumothorax. Bones unremarkable. IMPRESSION: Bibasilar opacities question atelectasis versus infiltrate. Electronically Signed   By: Ulyses Southward M.D.   On: 08/05/2018 08:27   Dg Chest 2 View  Result Date: 07/27/2018 CLINICAL DATA:  Fever and malaise EXAM: CHEST - 2 VIEW COMPARISON:  July 16, 2018 FINDINGS: Lungs are clear. The heart size and pulmonary vascularity are normal. No adenopathy. No bone lesions. IMPRESSION: No edema or consolidation. Electronically Signed   By: Bretta Bang III M.D.   On: 07/27/2018 15:00   Ct Chest W Contrast  Result  Date: 08/11/2018 CLINICAL DATA:  Fevers of unknown origin. EXAM: CT CHEST WITH CONTRAST TECHNIQUE: Multidetector CT imaging of the chest was performed during intravenous contrast administration. CONTRAST:  75 mL OMNIPAQUE IOHEXOL 300 MG/ML  SOLN COMPARISON:  CT chest, abdomen and pelvis 08/01/2018. PA and lateral chest 07/27/2010 and 08/05/2018 FINDINGS: Cardiovascular: Heart size is mildly enlarged. No pericardial effusion. No atherosclerotic vascular disease is seen.  Mediastinum/Nodes: Small bilateral axillary lymph nodes have clearly visible fatty hila consistent with benignity. No mediastinal or hilar lymphadenopathy by CT size criteria. Lungs/Pleura: Small bilateral pleural effusions are not notably changed. Dependent bilateral lower lobe airspace disease is improved since the prior examination. Residual opacities have an appearance most compatible with atelectasis. Scattered ground-glass opacity seen on the comparison CT have resolved. 0.3 cm right upper lobe nodule on image 26 of series 5 is unchanged. Upper Abdomen: Status post cholecystectomy.  No acute finding. Musculoskeletal: T11 hemivertebra noted. No acute or focal abnormality. IMPRESSION: Scattered ground-glass opacities seen on the comparison CT have resolved. Dependent lower lobe airspace disease bilaterally is improved. Residual opacities have an appearance most compatible with atelectasis rather than pneumonia. No change in small bilateral pleural effusions. No change in a 0.3 cm right upper lobe pulmonary nodule. No follow-up needed if patient is low-risk. Non-contrast chest CT can be considered in 12 months if patient is high-risk. This recommendation follows the consensus statement: Guidelines for Management of Incidental Pulmonary Nodules Detected on CT Images: From the Fleischner Society 2017; Radiology 2017; 284:228-243. Cardiomegaly. Electronically Signed   By: Inge Rise M.D.   On: 08/11/2018 11:01   Ct Chest W Contrast  Result Date: 08/02/2018 CLINICAL DATA:  Fever of unknown origin. EXAM: CT CHEST, ABDOMEN, AND PELVIS WITH CONTRAST TECHNIQUE: Multidetector CT imaging of the chest, abdomen and pelvis was performed following the standard protocol during bolus administration of intravenous contrast. CONTRAST:  144mL OMNIPAQUE IOHEXOL 300 MG/ML SOLN, 62mL OMNIPAQUE IOHEXOL 300 MG/ML SOLN COMPARISON:  None. FINDINGS: CT CHEST FINDINGS Cardiovascular: There is no large centrally located pulmonary  embolus. Detection of smaller segmental and subsegmental pulmonary emboli is limited by contrast bolus timing. The main pulmonary artery is within normal limits for size. There is no CT evidence of acute right heart strain. The visualized aorta is normal. Heart size is normal. There is a trace pericardial effusion. Mediastinum/Nodes: --there are mildly prominent mediastinal and hilar lymph nodes bilaterally. --there is mild bilateral axillary adenopathy. --No supraclavicular lymphadenopathy. --Normal thyroid gland. --The esophagus is unremarkable Lungs/Pleura: There are trace bilateral pleural effusions with adjacent atelectasis. There is scattered ground-glass airspace opacities bilaterally, greatest within the right upper lobe. There is no pneumothorax. The trachea is unremarkable. Musculoskeletal: No chest wall abnormality. No acute or significant osseous findings. 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 CT ABDOMEN PELVIS FINDINGS Hepatobiliary: The liver is borderline enlarged. Status post cholecystectomy.There is no biliary ductal dilation. Pancreas: There is some mild peripancreatic fat stranding. Spleen: There is a small wedge-shaped defect in the lower pole the spleen measuring approximately 1.8 cm (axial series 2 image 47). Adrenals/Urinary Tract: --Adrenal glands: No adrenal hemorrhage. --Right kidney/ureter: No hydronephrosis or perinephric hematoma. --Left kidney/ureter: No hydronephrosis or perinephric hematoma. --Urinary bladder: Unremarkable. Stomach/Bowel: --Stomach/Duodenum: No hiatal hernia or other gastric abnormality. Normal duodenal course and caliber. --Small bowel: No dilatation or inflammation. --Colon: No focal abnormality. --Appendix: Normal. Vascular/Lymphatic: Normal course and caliber of the major abdominal vessels. There is a retroaortic left renal vein, a normal variant. --there are  prominent retroperitoneal lymph nodes. --there are prominent mesenteric  lymph nodes. There is haziness throughout the mesentery. There are enlarged right lower quadrant lymph nodes measuring approximately 1.1 cm in diameter. --there are mildly enlarged pelvic and inguinal lymph nodes. Reproductive: There is a small amount of free fluid in the pelvis which may be physiologic. There is no discrete ovarian mass. Other: There is a small volume of pelvic free fluid which is likely physiologic. No free air. There is some mild fat stranding involving the anterior abdominal wall bilaterally. Musculoskeletal. No acute displaced fractures. IMPRESSION: 1. Scattered ground-glass airspace opacities, greatest within the right upper lobe, concerning for multifocal pneumonia. 2. Small bilateral pleural effusions with adjacent atelectasis. 3. Mild diffuse adenopathy throughout the chest, abdomen, and pelvis of unknown clinical significance. 4. Peripancreatic free fluid with edema within the root of the mesentery. Findings are nonspecific and can be seen in patients with pancreatitis, versus less likely sclerosing mesenteritis or inflammatory bowel disease. There are some prominent lymph nodes right lower quadrant which can be seen in patients with mesenteric adenitis. 5. Normal appendix in the right lower quadrant. 6. Small volume free fluid in the pelvis favored to be physiologic. 7. Wedge-shaped hypoattenuating defect in the spleen of unknown clinical significance. Differential considerations include a splenic infarct versus less likely a splenic laceration, especially in the absence of a history of trauma. There is no perisplenic hematoma. Electronically Signed   By: Katherine Mantlehristopher  Eckerson M.D.   On: 08/02/2018 00:18   Mr Brain Wo Contrast  Result Date: 07/28/2018 CLINICAL DATA:  Encephalopathy with hallucinations EXAM: MRI HEAD WITHOUT CONTRAST TECHNIQUE: Multiplanar, multiecho pulse sequences of the brain and surrounding structures were obtained without intravenous contrast. COMPARISON:  Brain MRI  03/24/2018 FINDINGS: BRAIN: There is no acute infarct, acute hemorrhage or extra-axial collection. The midline structures are normal. The white matter signal is normal for the patient's age. The cerebral and cerebellar volume are age-appropriate. No hydrocephalus. Susceptibility-sensitive sequences show no chronic microhemorrhage or superficial siderosis. No midline shift or other mass effect. VASCULAR: The major intracranial arterial and venous sinus flow voids are normal. SKULL AND UPPER CERVICAL SPINE: Calvarial bone marrow signal is normal. There is no skull base mass. Visualized upper cervical spine and soft tissues are normal. SINUSES/ORBITS: No fluid levels or advanced mucosal thickening. No mastoid or middle ear effusion. The orbits are normal. IMPRESSION: Normal brain. Electronically Signed   By: Deatra RobinsonKevin  Herman M.D.   On: 07/28/2018 00:13   Mr Lumbar Spine Wo Contrast  Result Date: 08/02/2018 CLINICAL DATA:  Fever. Lower extremity weakness. History of multiple sclerosis. EXAM: MRI LUMBAR SPINE WITHOUT CONTRAST TECHNIQUE: Multiplanar, multisequence MR imaging of the lumbar spine was performed. No intravenous contrast was administered. COMPARISON:  CT abdomen pelvis from yesterday. FINDINGS: Despite efforts by the technologist and patient, motion artifact is present on today's exam and could not be eliminated. This reduces exam sensitivity and specificity. Segmentation:  Standard. Alignment:  Physiologic. Vertebrae:  No fracture, evidence of discitis, or bone lesion. Conus medullaris and cauda equina: Conus extends to the L1-L2 level. Conus and cauda equina appear normal. Paraspinal and other soft tissues: Symmetric bilateral paraspinous muscle edema. Otherwise negative. Disc levels: No significant disc bulge or herniation. No spinal canal or neuroforaminal stenosis. IMPRESSION: 1.  No acute abnormality or significant degenerative changes. Electronically Signed   By: Obie DredgeWilliam T Derry M.D.   On: 08/02/2018  09:14   Ct Abdomen Pelvis W Contrast  Result Date: 08/02/2018 CLINICAL DATA:  Fever of  unknown origin. EXAM: CT CHEST, ABDOMEN, AND PELVIS WITH CONTRAST TECHNIQUE: Multidetector CT imaging of the chest, abdomen and pelvis was performed following the standard protocol during bolus administration of intravenous contrast. CONTRAST:  100mL OMNIPAQUE IOHEXOL 300 MG/ML SOLN, 30mL OMNIPAQUE IOHEXOL 300 MG/ML SOLN COMPARISON:  None. FINDINGS: CT CHEST FINDINGS Cardiovascular: There is no large centrally located pulmonary embolus. Detection of smaller segmental and subsegmental pulmonary emboli is limited by contrast bolus timing. The main pulmonary artery is within normal limits for size. There is no CT evidence of acute right heart strain. The visualized aorta is normal. Heart size is normal. There is a trace pericardial effusion. Mediastinum/Nodes: --there are mildly prominent mediastinal and hilar lymph nodes bilaterally. --there is mild bilateral axillary adenopathy. --No supraclavicular lymphadenopathy. --Normal thyroid gland. --The esophagus is unremarkable Lungs/Pleura: There are trace bilateral pleural effusions with adjacent atelectasis. There is scattered ground-glass airspace opacities bilaterally, greatest within the right upper lobe. There is no pneumothorax. The trachea is unremarkable. Musculoskeletal: No chest wall abnormality. No acute or significant osseous findings. 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 CT ABDOMEN PELVIS FINDINGS Hepatobiliary: The liver is borderline enlarged. Status post cholecystectomy.There is no biliary ductal dilation. Pancreas: There is some mild peripancreatic fat stranding. Spleen: There is a small wedge-shaped defect in the lower pole the spleen measuring approximately 1.8 cm (axial series 2 image 47). Adrenals/Urinary Tract: --Adrenal glands: No adrenal hemorrhage. --Right kidney/ureter: No hydronephrosis or perinephric hematoma. --Left  kidney/ureter: No hydronephrosis or perinephric hematoma. --Urinary bladder: Unremarkable. Stomach/Bowel: --Stomach/Duodenum: No hiatal hernia or other gastric abnormality. Normal duodenal course and caliber. --Small bowel: No dilatation or inflammation. --Colon: No focal abnormality. --Appendix: Normal. Vascular/Lymphatic: Normal course and caliber of the major abdominal vessels. There is a retroaortic left renal vein, a normal variant. --there are prominent retroperitoneal lymph nodes. --there are prominent mesenteric lymph nodes. There is haziness throughout the mesentery. There are enlarged right lower quadrant lymph nodes measuring approximately 1.1 cm in diameter. --there are mildly enlarged pelvic and inguinal lymph nodes. Reproductive: There is a small amount of free fluid in the pelvis which may be physiologic. There is no discrete ovarian mass. Other: There is a small volume of pelvic free fluid which is likely physiologic. No free air. There is some mild fat stranding involving the anterior abdominal wall bilaterally. Musculoskeletal. No acute displaced fractures. IMPRESSION: 1. Scattered ground-glass airspace opacities, greatest within the right upper lobe, concerning for multifocal pneumonia. 2. Small bilateral pleural effusions with adjacent atelectasis. 3. Mild diffuse adenopathy throughout the chest, abdomen, and pelvis of unknown clinical significance. 4. Peripancreatic free fluid with edema within the root of the mesentery. Findings are nonspecific and can be seen in patients with pancreatitis, versus less likely sclerosing mesenteritis or inflammatory bowel disease. There are some prominent lymph nodes right lower quadrant which can be seen in patients with mesenteric adenitis. 5. Normal appendix in the right lower quadrant. 6. Small volume free fluid in the pelvis favored to be physiologic. 7. Wedge-shaped hypoattenuating defect in the spleen of unknown clinical significance. Differential  considerations include a splenic infarct versus less likely a splenic laceration, especially in the absence of a history of trauma. There is no perisplenic hematoma. Electronically Signed   By: Katherine Mantlehristopher  Strohmeyer M.D.   On: 08/02/2018 00:18   Ct Maxillofacial W Contrast  Result Date: 08/08/2018 CLINICAL DATA:  Fevers.  Right mandibular molar tooth pain. EXAM: CT MAXILLOFACIAL WITH CONTRAST TECHNIQUE: Multidetector CT imaging of the maxillofacial structures  was performed with intravenous contrast. Multiplanar CT image reconstructions were also generated. CONTRAST:  75mL OMNIPAQUE IOHEXOL 300 MG/ML  SOLN COMPARISON:  MRI 07/27/2018 FINDINGS: Osseous: No maxillary, mid face or upper face abnormality is seen. There is no evidence of active dental or periodontal disease. With respect to the right mandible, teeth 25 through 31 appear normal. Tooth 32 is absent and presumably has been extracted in the distant past. There is a benign appearing focus of hazy sclerosis measuring about 1.5 cm at the angle of the mandible on the right that could represent sequela of the previous extraction. If the patient has not had extraction of tooth 32, then this could be some dysplastic change related to aplasia of the tooth. I do not see any likely significant finding in this region. Left mandibular molar remains in place, not fully erupted. Orbits: Normal Sinuses: Clear Soft tissues: Normal. No soft tissue inflammatory changes are evident. Limited intracranial: Normal IMPRESSION: No evidence of dental or periodontal decay or inflammatory disease. Absent right mandibular molar tooth. Presumably there has been distant extraction. Hazy sclerosis within the mandible at the angle of the mandible could be chronic healed sequela of the molar tooth extraction. If there is not a history of molar tooth extraction, then there could be aplasia/hypoplasia of tooth 32. Electronically Signed   By: Paulina Fusi M.D.   On: 08/08/2018 11:49   Vas [place]  Lower Extremity Venous (dvt)  Result Date: 08/06/2018  Lower Venous Study Indications: Immobility, fever.  Limitations: Patient positioning. Comparison Study: No prior Performing Technologist: Blanch Media RVS  Examination Guidelines: A complete evaluation includes B-mode imaging, spectral Doppler, color Doppler, and power Doppler as needed of all accessible portions of each vessel. Bilateral testing is considered an integral part of a complete examination. Limited examinations for reoccurring indications may be performed as noted.  +---------+---------------+---------+-----------+----------+--------------+ RIGHT    CompressibilityPhasicitySpontaneityPropertiesSummary        +---------+---------------+---------+-----------+----------+--------------+ CFV      Full           Yes      Yes                                 +---------+---------------+---------+-----------+----------+--------------+ SFJ      Full                                                        +---------+---------------+---------+-----------+----------+--------------+ FV Prox  Full                                                        +---------+---------------+---------+-----------+----------+--------------+ FV Mid   Full                                                        +---------+---------------+---------+-----------+----------+--------------+ FV DistalFull                                                        +---------+---------------+---------+-----------+----------+--------------+  PFV      Full                                                        +---------+---------------+---------+-----------+----------+--------------+ POP      Full           Yes      Yes                                 +---------+---------------+---------+-----------+----------+--------------+ PTV      Full                                                         +---------+---------------+---------+-----------+----------+--------------+ PERO                                                  Not visualized +---------+---------------+---------+-----------+----------+--------------+   +---------+---------------+---------+-----------+----------+--------------+ LEFT     CompressibilityPhasicitySpontaneityPropertiesSummary        +---------+---------------+---------+-----------+----------+--------------+ CFV      Full           Yes      Yes                                 +---------+---------------+---------+-----------+----------+--------------+ SFJ      Full                                                        +---------+---------------+---------+-----------+----------+--------------+ FV Prox  Full                                                        +---------+---------------+---------+-----------+----------+--------------+ FV Mid   Full                                                        +---------+---------------+---------+-----------+----------+--------------+ FV DistalFull                                                        +---------+---------------+---------+-----------+----------+--------------+ PFV      Full                                                        +---------+---------------+---------+-----------+----------+--------------+  POP      Full           Yes      Yes                                 +---------+---------------+---------+-----------+----------+--------------+ PTV      Full                                                        +---------+---------------+---------+-----------+----------+--------------+ PERO                                                  Not visualized +---------+---------------+---------+-----------+----------+--------------+     Summary: Right: There is no evidence of deep vein thrombosis in the lower extremity. No cystic structure found in the  popliteal fossa. Left: There is no evidence of deep vein thrombosis in the lower extremity. No cystic structure found in the popliteal fossa.  *See table(s) above for measurements and observations. Electronically signed by Lemar LivingsBrandon Cain MD on 08/06/2018 at 11:00:59 AM.    Final     ASSESSMENT AND PLAN: 1. Pancytopenia -characterized by leukopenia, normocytic anemia, and mild, intermittent thrombocytopenia. 2.  Multiple sclerosis 3.  Positive ANA 4.  Fever 5.  Tachycardia 6.  Lip swelling 7.  Candida esophagitis 8.  Positive DAT for IgG  Emily Hicks appears unchanged.  White blood cell count slowly improving and the remainder of the CBC remained stable. Labs do not show evidence of iron deficiency, Vitamin B12 deficiency, or folate deficiency. LDH is elevated, Reticulocytes not elevated, Haptoglobin was normal and SPEP did not show an M spike.  Repeat ANA was positive, 1: 1280.  C3 and C4 complement are both low.  Coombs positive.  She has been started on steroids.  Pancytopenia likely related to underlying rheumatological disorder.  1.  White blood cell count is improving and the remainder of her CBC is stable.  Recommend continued steroids with follow-up with rheumatology.  She can have a repeat CBC performed as an outpatient by either her primary care provider or rheumatology.    LOS: 20 days   Clenton PareKristin Curcio, DNP, AGPCNP-BC, AOCNP 08/16/18 Emily Hicks appears to have SLE.  The hematologic findings are most likely related to SLE.  The DAT returned positive.  The anemia may be related to bone marrow suppression, phlebotomy, and there may be a component of hemolysis.  The CBC should be followed closely as an outpatient.  She can follow-up with her primary provider and rheumatology.  I will be available for questions and to see her as needed.

## 2018-08-16 NOTE — Discharge Instructions (Signed)

## 2018-08-16 NOTE — Progress Notes (Signed)
Pt. Did not urinate throughout the night, despite of a couple of attempts to get up on the [facility] with assistance. Bladder scan performed and revealed a max volume of 46 mL. Patient comfortable in the chair, with no c/o pressure or urgency to urinate. On call NP Schorr paged and made aware. New order for 500 mL bolus given and will be administered. Will continue to monitor pt. Closely.

## 2018-08-16 NOTE — Progress Notes (Signed)
Emily Hicks to be D/C'd per MD order. Discussed with the patient and all questions fully answered. ? VSS, Skin clean, dry and intact without evidence of skin break down, no evidence of skin tears noted. ? IV catheter discontinued intact. Site without signs and symptoms of complications. Dressing and pressure applied. ? An After Visit Summary was printed and given to the patient. Patient informed where to pickup prescriptions. ? D/C education completed with patient/family including follow up instructions, medication list, d/c activities limitations if indicated, with other d/c instructions as indicated by MD - patient able to verbalize understanding, all questions fully answered.  ? Patient instructed to return to ED, call 911, or call MD for any changes in condition.  ? Patient to be escorted via [place], and D/C home via private auto.

## 2018-08-16 NOTE — TOC Initial Note (Signed)
Transition of Care [facility]) - Initial/Assessment Note    Patient Details  Name: Emily Hicks MRN: [id number] Date of Birth: 23-Mar-1985  [facility]) CM/SW Contact:    Armanda Heritage, RN Phone Number: 08/16/2018, 4:23 PM  Clinical Narrative: [facility] PT OT Aide.  [employer] to deliver rolling walker and 3-in-1 to room for home use.                   Expected Discharge Plan: Home w Home Health Services Barriers to Discharge: No Barriers Identified   Patient Goals and CMS Choice        Expected Discharge Plan and Services Expected Discharge Plan: Home w Home Health Services   Discharge Planning Services: CM Consult   Living arrangements for the past 2 months: Single Family Home Expected Discharge Date: 08/16/18               DME Arranged: Cherre Huger rolling DME Agency: [employer] Date DME Agency Contacted: 08/16/18 Time DME Agency Contacted: 1600 Representative spoke with at DME Agency: Ian Malkin HH Arranged: PT, OT, Nurse's Aide HH Agency: [facility] Date HH Agency Contacted: 08/16/18 Time HH Agency Contacted: 1622 Representative spoke with at [facility] Agency: karen  Prior Living Arrangements/Services Living arrangements for the past 2 months: Single Family Home Lives with:: Spouse Patient language and need for interpreter reviewed:: Yes Do you feel safe going back to the place where you live?: Yes      Need for Family Participation in Patient Care: Yes (Comment) Care giver support system in place?: Yes (comment)   Criminal Activity/Legal Involvement Pertinent to Current Situation/Hospitalization: No - Comment as needed  Activities of Daily Living Home Assistive Devices/Equipment: Eyeglasses ADL Screening (condition at time of admission) Patient's cognitive ability adequate to safely complete daily activities?: Yes Is the patient deaf or have difficulty hearing?: No Does the patient have difficulty  seeing, even when wearing glasses/contacts?: No Does the patient have difficulty concentrating, remembering, or making decisions?: No Patient able to express need for assistance with ADLs?: Yes Does the patient have difficulty dressing or bathing?: No Independently performs ADLs?: No Communication: Independent Dressing (OT): Needs assistance Is this a change from baseline?: Change from baseline, expected to last >3 days Grooming: Needs assistance Is this a change from baseline?: Change from baseline, expected to last >3 days Feeding: Needs assistance Is this a change from baseline?: Change from baseline, expected to last >3 days Bathing: Needs assistance Is this a change from baseline?: Change from baseline, expected to last >3 days Toileting: Needs assistance Is this a change from baseline?: Change from baseline, expected to last >3days In/Out Bed: Needs assistance Is this a change from baseline?: Change from baseline, expected to last >3 days Walks in Home: Needs assistance Is this a change from baseline?: Change from baseline, expected to last >3 days Does the patient have difficulty walking or climbing stairs?: Yes Weakness of Legs: Both Weakness of Arms/Hands: Both  Permission Sought/Granted                  Emotional Assessment Appearance:: Appears stated age Attitude/Demeanor/Rapport: Engaged Affect (typically observed): Accepting Orientation: : Oriented to Self, Oriented to Place, Oriented to  Time, Oriented to Situation   Psych Involvement: No (comment)  Admission diagnosis:  Hypernatremia [E87.0] Fever, unspecified fever cause [R50.9] Sepsis (HCC) [A41.9] Patient Active Problem List   Diagnosis Date Noted  . SIRS (systemic inflammatory response syndrome) (  [place]) 08/16/2018  . SLE exacerbation ([place]) 08/16/2018  . Pancytopenia ([place])   . Anxiety and depression   . Pressure injury of skin 08/02/2018  . Elevated liver enzymes 08/02/2018  . Normocytic anemia   .  Hypomagnesemia   . Candida esophagitis ([place])   . Hypokalemia   . AKI (acute kidney injury) ([place]) 07/27/2018  . Hypernatremia 07/27/2018  . Altered mental status 07/27/2018  . MS (multiple sclerosis) ([place]) 03/31/2018  . Chronic migraine 08/26/2017  . Chondromalacia, patella, left 06/24/2017  . Healthcare maintenance 12/20/2015  . Contraceptive management 10/11/2014  . Hidradenitis suppurativa of left axilla 06/07/2013  . Dysplasia of cervix, low grade (CIN 1) 07/03/2011  . Class 3 severe obesity due to excess calories without serious comorbidity with body mass index (BMI) of 40.0 to 44.9 in adult [facility]) 05/14/2011   PCP:  [name], MD Pharmacy:   [facility] [place], [place] - [address] [place] [address] Phone: [phone] Fax: [place], [place] [address] [place] [address] Phone: [phone] Fax: [phone]     Social Determinants of Health (SDOH) Interventions    Readmission Risk Interventions No flowsheet data found.

## 2018-08-16 NOTE — Progress Notes (Signed)
Physical Therapy Treatment Patient Details Name: Emily Hicks MRN: [id number] DOB: [birth year] Today's Date: 08/16/2018    History of Present Illness This 33 year old female was admitted for progressive weakness and AMS.  She was recently dxd with MS    PT Comments    Pt assisted with ambulating in hallway and required min assist for fatigue and weakness.  Pt anticipates d/c home today.     Follow Up Recommendations  Home health PT(plans to d/c home)     Equipment Recommendations  Rolling walker with 5" wheels    Recommendations for Other Services       Precautions / Restrictions Precautions Precautions: Fall Precaution Comments: monitor HR    Mobility  Bed Mobility Overal bed mobility: Needs Assistance Bed Mobility: Supine to Sit     Supine to sit: Supervision;HOB elevated     General bed mobility comments: pt utilized bed rail to self assist  Transfers Overall transfer level: Needs assistance Equipment used: Rolling walker (2 wheeled) Transfers: Sit to/from Stand Sit to Stand: Min assist         General transfer comment: slight assist to rise and stabilize, utilized UE to self assist with cues  Ambulation/Gait Ambulation/Gait assistance: Min assist;Min guard Gait Distance (Feet): 160 Feet Assistive device: Rolling walker (2 wheeled) Gait Pattern/deviations: Step-through pattern;Decreased stride length Gait velocity: decreased   General Gait Details: slow, effortful and uncoordinated gait, required 2 seated rest breaks, HR up to 135 bpm during ambulation   Stairs             Wheelchair Mobility    Modified Rankin (Stroke Patients Only)       Balance                                            Cognition Arousal/Alertness: Awake/alert Behavior During Therapy: Flat affect                                   General Comments: slow to process/initiate commands; flat affect      Exercises      General  Comments        Pertinent Vitals/Pain      Home Living                      Prior Function            PT Goals (current goals can now be found in the care plan section) Progress towards PT goals: Progressing toward goals    Frequency    Min 3X/week      PT Plan Current plan remains appropriate    Co-evaluation              AM-PAC PT "6 Clicks" Mobility   Outcome Measure  Help needed turning from your back to your side while in a flat bed without using bedrails?: A Little Help needed moving from lying on your back to sitting on the side of a flat bed without using bedrails?: A Little Help needed moving to and from a bed to a chair (including a wheelchair)?: A Little Help needed standing up from a chair using your arms (Hicks.g., wheelchair or bedside chair)?: A Little Help needed to walk in hospital room?: A Little Help needed climbing 3-5 steps with  a railing? : A Lot 6 Click Score: 17    End of Session Equipment Utilized During Treatment: Gait belt Activity Tolerance: Patient tolerated treatment well Patient left: in chair;with chair alarm set;with call bell/phone within reach Nurse Communication: Mobility status PT Visit Diagnosis: Other abnormalities of gait and mobility (R26.89);Difficulty in walking, not elsewhere classified (R26.2)     Time: [id number] PT Time Calculation (min) (ACUTE ONLY): 23 min  Charges:  $Gait Training: 8-22 mins                     Emily Hicks, PT, DPT Acute Rehabilitation Services Office: [phone] Pager: [phone]  Emily Hicks,Emily Hicks 08/16/2018, 12:49 PM

## 2018-08-16 NOTE — Progress Notes (Signed)
Daphine Deutscher to be D/C'd per MD order. Discussed with the patient and all questions fully answered. ? VSS, Skin clean, dry and intact without evidence of skin break down, no evidence of skin tears noted. ? IV catheter discontinued intact. Site without signs and symptoms of complications. Dressing and pressure applied. ? An After Visit Summary was printed and given to the patient. Patient informed where to pickup prescriptions. ? D/c education completed with patient/family including follow up instructions, medication list, d/c activities limitations if indicated, with other d/c instructions as indicated by MD - patient able to verbalize understanding, all questions fully answered.  ? Patient instructed to return to ED, call 911, or call MD for any changes in condition.  ? Patient to be escorted via [place], and D/C home via private auto.

## 2018-08-16 NOTE — Progress Notes (Signed)
Inpatient Rehabilitation-Admissions Coordinator   Noted pt plans to DC home today per MD and Nursing notes; confirmed plans with CM, Beverlee Nims.  AC will sign off at this time.   Please call if questions.   Jhonnie Garner, OTR/L  Rehab Admissions Coordinator  [phone] 08/16/2018 12:42 PM

## 2018-08-16 NOTE — Discharge Summary (Addendum)
Physician Discharge Summary  Emily Hicks ZGY:[id number] DOB: 1985-10-19 DOA: 07/27/2018  PCP: [name], MD  Admit date: 07/27/2018 Discharge date: 08/16/2018  Admitted From: home Disposition:  home   Recommendations for Outpatient Follow-up:  1. Needs CBC, BMet, Mag, Phos in 2 days  Home Health:  ordered Discharge Condition:  stable   CODE STATUS:  Full code   Diet recommendation:  Heart healthy Consultations:  oncology   ID  Neurology  GI  Psych   Discharge Diagnoses:  Principal Problem:   Probable SLE exacerbation  Active Problems:   SIRS (systemic inflammatory response syndrome) (HCC)   Class 3 severe obesity due to excess calories without serious comorbidity with body mass index (BMI) of 40.0 to 44.9 in adult [facility])   MS (multiple sclerosis) (HCC)   AKI (acute kidney injury) ([facility])   Elevated liver enzymes   Hypernatremia   Hypokalemia   Candida esophagitis (HCC)   Hypomagnesemia   Normocytic anemia   Pressure injury of skin   Anxiety and depression      Hospital Course:  Emily Hicks is an 33 y.o.femalewith medical history significant ofmultiple sclerosis, depression who was brought by her husband to the emergency department for the evaluation of altered mental status, fever, generalized weakness.For her MS, she was on Aubagio until a week ago due to intolerance. She presents with Confusion, weakness, loss of appetite,sepsis.Found to have severe oropharyngeal thrush with odynophagia.   Fever recurred on 08/03/2018.     Principal Problem: SIRS- suspected SLE - presented with fevers, thrush, loss of appetite, weakness, elevated LFTs- subsequently developed pancytopenia and angioedema of lips - ID consulted and extensive work up ordered  - 6/28- CT chest Abd/ Pelvis> scattered ground-glass airspace opacities bilaterally, greatest within the right upper lobe, mild diffuse adenopathy in chest, abdomen and pelvis, peripancreatic free fluid - the  patient was on various different antibiotics early on during the hospital stay (Vanc, Ceftriaxone, Zosyn) but antibiotics stopped on 7/1 by ID and recommendation made to avoid aniboitics - 2 d ECHO > no endocarditis - Blood cultures- contaminant noted on 7/1 cultures> coag neg staph in 1 set - subsequently, blood cultures have been negative - c diff neg - Maxillofacial CT unrevealing - elevated CMV IgM but CMV PCR negative - CT chest repeated - shows that lung opacities have resolved - ANA is positive 1:1280-   ordered a reflex panel - I also spoke with Dr Kathlene November, rheumatology for further advice regarding diagnosis and treatment - of note a reflex panel was done in Feb of this year and she was noted to have a positive anti dsDNA, anti-smith, anti-RNP, Anti-SSA suggesting a diagnosis of SLE - C3 and C4 noted to be low consistent with autoimmune disorder -  repeat reflex panel   consistent with SLE- will started IV steroids pn Saturday - lip swelling resolved, fevers resolved and Pancytopenia improving - I have asked her to follow up with Dr Kathlene November, on 1 wk - he has told me that his office with contact her -  will d/c home with Prednisone  Active Problems:   Thrush/ Candida esophagitis - cont Diflucan daily x 1 more week- improving  Leukopenia, anemia, mild thrombocytopenia - based on past blood work, it appears she has been slighly leukopenic since 03/31/18 (WBC 3.4 at that time) - WBC dropped as low as 1.3-  - baseline Hb ~ 12-13- now is ~ 7-8 range - appreciate oncology eval- Dr Ammie Dalton would like to have a  bone marrow biopsy performed but the patient repeatedly declined this - SPEP negative - counts improving after starting steroids- follow as outpt  Swelling of lips - likely due to autoimmune process - see above    MS (multiple sclerosis)   - recent diagnosis - she stopped taking Aubagio about 1 wk prior to admission as she was not feeling well - MRI on 6/29- shows no acute  findings  Hypernatremia/ dehydration/ slightly elevated Cr on admisson - has resolved after IVF  Hypokalemia, Hypophosphatemia, Hypomagnesemia - electrolytes  have been replaced- please f/u as outpt     Mildly elevated LFTs - borderline enlarged liver noted on CT- follow  Severe protein calorie malnutrition - cont dietary supplements- she is very malnourished but hr appetite is beginning to improve  Severe weakness - due to acute illness with underlying MS - it was felt by PT that she might need to go to CIR for therapy but the patient has declined this and has actually been ambulating without much assistance in the hospital- she has her husband to assist her at home  Morbid obesity Body mass index is 37.07 kg/m.   Depression - resumed Cymbalta and Remeron- as she was persistently depressed in the hospital, her husband insisted on a psych eval- she was hesitant but eventually agreed- psychiatry has increased her Cymbalta, stopped her Remeron and started a low dose of Trazodone - please follow as outpt   Discharge Exam: Vitals:   08/15/18 2109 08/16/18 0510  BP: (!) 128/96 (!) 123/96  Pulse: (!) 104 91  Resp: 16 16  Temp: 97.9 F (36.6 C) 97.8 F (36.6 C)  SpO2: 97% 98%   Vitals:   08/15/18 1415 08/15/18 1435 08/15/18 2109 08/16/18 0510  BP: 123/88  (!) 128/96 (!) 123/96  Pulse: 98  (!) 104 91  Resp: '16  16 16  ' Temp: (!) 97.5 F (36.4 C)  97.9 F (36.6 C) 97.8 F (36.6 C)  TempSrc: Oral  Oral Oral  SpO2: 99% 100% 97% 98%  Weight:      Height:        General: Pt is alert, awake, not in acute distress Cardiovascular: RRR, S1/S2 +, no rubs, no gallops Respiratory: CTA bilaterally, no wheezing, no rhonchi Abdominal: Soft, NT, ND, bowel sounds + Extremities: no edema, no cyanosis   Discharge Instructions  Discharge Instructions    Diet - low sodium heart healthy   Complete by: As directed    Increase activity slowly   Complete by: As directed       Allergies as of 08/16/2018   No Known Allergies     Medication List    STOP taking these medications   ALPRAZolam 1 MG tablet Commonly known as: XANAX   ibuprofen 200 MG tablet Commonly known as: ADVIL   mirtazapine 15 MG tablet Commonly known as: Remeron   naproxen 500 MG tablet Commonly known as: NAPROSYN     TAKE these medications   DULoxetine 30 MG capsule Commonly known as: Cymbalta Take 3 capsules (90 mg total) by mouth daily. What changed:   medication strength  how much to take   feeding supplement (ENSURE ENLIVE) Liqd Take 237 mLs by mouth daily.   feeding supplement (PRO-STAT SUGAR FREE 64) Liqd Take 30 mLs by mouth 2 (two) times daily.   fluconazole 100 MG tablet Commonly known as: DIFLUCAN Take 1 tablet (100 mg total) by mouth daily. Start taking on: August 16, 1101   folic acid 1 MG tablet  Commonly known as: FOLVITE Take 2 tablets (2 mg total) by mouth daily. Start taking on: August 17, 2018   magnesium oxide 400 (241.3 Mg) MG tablet Commonly known as: MAG-OX Take 1 tablet (400 mg total) by mouth 2 (two) times daily.   medroxyPROGESTERone 150 MG/ML injection Commonly known as: DEPO-PROVERA Inject 1 mL (150 mg total) into the muscle every 3 (three) months.   metoprolol tartrate 25 MG tablet Commonly known as: LOPRESSOR Take 1 tablet (25 mg total) by mouth 2 (two) times daily.   multivitamin with minerals Tabs tablet Take 1 tablet by mouth daily. Start taking on: August 17, 2018   nystatin 100000 UNIT/ML suspension Commonly known as: MYCOSTATIN Take 5 mLs (500,000 Units total) by mouth 4 (four) times daily.   predniSONE 20 MG tablet Commonly known as: DELTASONE Take 60 mg daily for 7 days and then decrease to 40 mg daily. Your doctor will tell you how to reduce if further. DO NOT STOP SUDDENLY   traZODone 50 MG tablet Commonly known as: DESYREL Take 1 tablet (50 mg total) by mouth at bedtime.      Follow-up Information    Lahoma Rocker, MD Follow up.   Specialty: Rheumatology Why: please call and make and appointment to be seen as soon as possible.  Contact information: [address] [place] [address] [phone]        [name], MD Follow up.   Specialty: Family Medicine Why: Please have the following blood work done in 2 days- [employer] information: [place]. [place] [address] [phone]          No Known Allergies   Procedures/Studies:  2 d ECHO   1. The left ventricle has hyperdynamic systolic function, with an ejection fraction of >65%. The cavity size was normal. Left ventricular diastolic Doppler parameters are consistent with impaired relaxation.  2. The right ventricle has normal systolic function. The cavity was normal. There is no increase in right ventricular wall thickness.   Dg Chest 2 View  Result Date: 08/05/2018 CLINICAL DATA:  Fever of unknown origin, multiple sclerosis EXAM: CHEST - 2 VIEW COMPARISON:  07/27/2018 FINDINGS: Borderline enlargement of cardiac silhouette. Mediastinal contours and pulmonary vascularity normal. Bibasilar opacities question atelectasis versus infiltrate. Lungs otherwise clear. No pleural effusion or pneumothorax. Bones unremarkable. IMPRESSION: Bibasilar opacities question atelectasis versus infiltrate. Electronically Signed   By: Lavonia Dana M.D.   On: 08/05/2018 08:27   Dg Chest 2 View  Result Date: 07/27/2018 CLINICAL DATA:  Fever and malaise EXAM: CHEST - 2 VIEW COMPARISON:  July 16, 2018 FINDINGS: Lungs are clear. The heart size and pulmonary vascularity are normal. No adenopathy. No bone lesions. IMPRESSION: No edema or consolidation. Electronically Signed   By: Lowella Grip III M.D.   On: 07/27/2018 15:00   Ct Chest W Contrast  Result Date: 08/11/2018 CLINICAL DATA:  Fevers of unknown origin. EXAM: CT CHEST WITH CONTRAST TECHNIQUE: Multidetector CT imaging of the chest was performed  during intravenous contrast administration. CONTRAST:  75 mL OMNIPAQUE IOHEXOL 300 MG/ML  SOLN COMPARISON:  CT chest, abdomen and pelvis 08/01/2018. PA and lateral chest 07/27/2010 and 08/05/2018 FINDINGS: Cardiovascular: Heart size is mildly enlarged. No pericardial effusion. No atherosclerotic vascular disease is seen. Mediastinum/Nodes: Small bilateral axillary lymph nodes have clearly visible fatty hila consistent with benignity. No mediastinal or hilar lymphadenopathy by CT size criteria. Lungs/Pleura: Small bilateral pleural effusions are not notably changed. Dependent bilateral lower  lobe airspace disease is improved since the prior examination. Residual opacities have an appearance most compatible with atelectasis. Scattered ground-glass opacity seen on the comparison CT have resolved. 0.3 cm right upper lobe nodule on image 26 of series 5 is unchanged. Upper Abdomen: Status post cholecystectomy.  No acute finding. Musculoskeletal: T11 hemivertebra noted. No acute or focal abnormality. IMPRESSION: Scattered ground-glass opacities seen on the comparison CT have resolved. Dependent lower lobe airspace disease bilaterally is improved. Residual opacities have an appearance most compatible with atelectasis rather than pneumonia. No change in small bilateral pleural effusions. No change in a 0.3 cm right upper lobe pulmonary nodule. No follow-up needed if patient is low-risk. Non-contrast chest CT can be considered in 12 months if patient is high-risk. This recommendation follows the consensus statement: Guidelines for Management of Incidental Pulmonary Nodules Detected on CT Images: From the Fleischner Society 2017; Radiology 2017; [id number]:[id number]. Cardiomegaly. Electronically Signed   By: Inge Rise M.D.   On: 08/11/2018 11:01   Ct Chest W Contrast  Result Date: 08/02/2018 CLINICAL DATA:  Fever of unknown origin. EXAM: CT CHEST, ABDOMEN, AND PELVIS WITH CONTRAST TECHNIQUE: Multidetector CT imaging of the  chest, abdomen and pelvis was performed following the standard protocol during bolus administration of intravenous contrast. CONTRAST:  113m OMNIPAQUE IOHEXOL 300 MG/ML SOLN, 359mOMNIPAQUE IOHEXOL 300 MG/ML SOLN COMPARISON:  None. FINDINGS: CT CHEST FINDINGS Cardiovascular: There is no large centrally located pulmonary embolus. Detection of smaller segmental and subsegmental pulmonary emboli is limited by contrast bolus timing. The main pulmonary artery is within normal limits for size. There is no CT evidence of acute right heart strain. The visualized aorta is normal. Heart size is normal. There is a trace pericardial effusion. Mediastinum/Nodes: --there are mildly prominent mediastinal and hilar lymph nodes bilaterally. --there is mild bilateral axillary adenopathy. --No supraclavicular lymphadenopathy. --Normal thyroid gland. --The esophagus is unremarkable Lungs/Pleura: There are trace bilateral pleural effusions with adjacent atelectasis. There is scattered ground-glass airspace opacities bilaterally, greatest within the right upper lobe. There is no pneumothorax. The trachea is unremarkable. Musculoskeletal: No chest wall abnormality. No acute or significant osseous findings. 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 CT ABDOMEN PELVIS FINDINGS Hepatobiliary: The liver is borderline enlarged. Status post cholecystectomy.There is no biliary ductal dilation. Pancreas: There is some mild peripancreatic fat stranding. Spleen: There is a small wedge-shaped defect in the lower pole the spleen measuring approximately 1.8 cm (axial series 2 image 47). Adrenals/Urinary Tract: --Adrenal glands: No adrenal hemorrhage. --Right kidney/ureter: No hydronephrosis or perinephric hematoma. --Left kidney/ureter: No hydronephrosis or perinephric hematoma. --Urinary bladder: Unremarkable. Stomach/Bowel: --Stomach/Duodenum: No hiatal hernia or other gastric abnormality. Normal duodenal course and  caliber. --Small bowel: No dilatation or inflammation. --Colon: No focal abnormality. --Appendix: Normal. Vascular/Lymphatic: Normal course and caliber of the major abdominal vessels. There is a retroaortic left renal vein, a normal variant. --there are prominent retroperitoneal lymph nodes. --there are prominent mesenteric lymph nodes. There is haziness throughout the mesentery. There are enlarged right lower quadrant lymph nodes measuring approximately 1.1 cm in diameter. --there are mildly enlarged pelvic and inguinal lymph nodes. Reproductive: There is a small amount of free fluid in the pelvis which may be physiologic. There is no discrete ovarian mass. Other: There is a small volume of pelvic free fluid which is likely physiologic. No free air. There is some mild fat stranding involving the anterior abdominal wall bilaterally. Musculoskeletal. No acute displaced fractures. IMPRESSION: 1. Scattered ground-glass airspace opacities, greatest within  the right upper lobe, concerning for multifocal pneumonia. 2. Small bilateral pleural effusions with adjacent atelectasis. 3. Mild diffuse adenopathy throughout the chest, abdomen, and pelvis of unknown clinical significance. 4. Peripancreatic free fluid with edema within the root of the mesentery. Findings are nonspecific and can be seen in patients with pancreatitis, versus less likely sclerosing mesenteritis or inflammatory bowel disease. There are some prominent lymph nodes right lower quadrant which can be seen in patients with mesenteric adenitis. 5. Normal appendix in the right lower quadrant. 6. Small volume free fluid in the pelvis favored to be physiologic. 7. Wedge-shaped hypoattenuating defect in the spleen of unknown clinical significance. Differential considerations include a splenic infarct versus less likely a splenic laceration, especially in the absence of a history of trauma. There is no perisplenic hematoma. Electronically Signed   By: Constance Holster M.D.   On: 08/02/2018 00:18   Mr Brain Wo Contrast  Result Date: 07/28/2018 CLINICAL DATA:  Encephalopathy with hallucinations EXAM: MRI HEAD WITHOUT CONTRAST TECHNIQUE: Multiplanar, multiecho pulse sequences of the brain and surrounding structures were obtained without intravenous contrast. COMPARISON:  Brain MRI 03/24/2018 FINDINGS: BRAIN: There is no acute infarct, acute hemorrhage or extra-axial collection. The midline structures are normal. The white matter signal is normal for the patient's age. The cerebral and cerebellar volume are age-appropriate. No hydrocephalus. Susceptibility-sensitive sequences show no chronic microhemorrhage or superficial siderosis. No midline shift or other mass effect. VASCULAR: The major intracranial arterial and venous sinus flow voids are normal. SKULL AND UPPER CERVICAL SPINE: Calvarial bone marrow signal is normal. There is no skull base mass. Visualized upper cervical spine and soft tissues are normal. SINUSES/ORBITS: No fluid levels or advanced mucosal thickening. No mastoid or middle ear effusion. The orbits are normal. IMPRESSION: Normal brain. Electronically Signed   By: Ulyses Jarred M.D.   On: 07/28/2018 00:13   Mr Lumbar Spine Wo Contrast  Result Date: 08/02/2018 CLINICAL DATA:  Fever. Lower extremity weakness. History of multiple sclerosis. EXAM: MRI LUMBAR SPINE WITHOUT CONTRAST TECHNIQUE: Multiplanar, multisequence MR imaging of the lumbar spine was performed. No intravenous contrast was administered. COMPARISON:  CT abdomen pelvis from yesterday. FINDINGS: Despite efforts by the technologist and patient, motion artifact is present on today's exam and could not be eliminated. This reduces exam sensitivity and specificity. Segmentation:  Standard. Alignment:  Physiologic. Vertebrae:  No fracture, evidence of discitis, or bone lesion. Conus medullaris and cauda equina: Conus extends to the L1-L2 level. Conus and cauda equina appear normal. Paraspinal and  other soft tissues: Symmetric bilateral paraspinous muscle edema. Otherwise negative. Disc levels: No significant disc bulge or herniation. No spinal canal or neuroforaminal stenosis. IMPRESSION: 1.  No acute abnormality or significant degenerative changes. Electronically Signed   By: Titus Dubin M.D.   On: 08/02/2018 09:14   Ct Abdomen Pelvis W Contrast  Result Date: 08/02/2018 CLINICAL DATA:  Fever of unknown origin. EXAM: CT CHEST, ABDOMEN, AND PELVIS WITH CONTRAST TECHNIQUE: Multidetector CT imaging of the chest, abdomen and pelvis was performed following the standard protocol during bolus administration of intravenous contrast. CONTRAST:  132m OMNIPAQUE IOHEXOL 300 MG/ML SOLN, 324mOMNIPAQUE IOHEXOL 300 MG/ML SOLN COMPARISON:  None. FINDINGS: CT CHEST FINDINGS Cardiovascular: There is no large centrally located pulmonary embolus. Detection of smaller segmental and subsegmental pulmonary emboli is limited by contrast bolus timing. The main pulmonary artery is within normal limits for size. There is no CT evidence of acute right heart strain. The visualized aorta is normal. Heart size is  normal. There is a trace pericardial effusion. Mediastinum/Nodes: --there are mildly prominent mediastinal and hilar lymph nodes bilaterally. --there is mild bilateral axillary adenopathy. --No supraclavicular lymphadenopathy. --Normal thyroid gland. --The esophagus is unremarkable Lungs/Pleura: There are trace bilateral pleural effusions with adjacent atelectasis. There is scattered ground-glass airspace opacities bilaterally, greatest within the right upper lobe. There is no pneumothorax. The trachea is unremarkable. Musculoskeletal: No chest wall abnormality. No acute or significant osseous findings. 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 CT ABDOMEN PELVIS FINDINGS Hepatobiliary: The liver is borderline enlarged. Status post cholecystectomy.There is no biliary ductal dilation. Pancreas:  There is some mild peripancreatic fat stranding. Spleen: There is a small wedge-shaped defect in the lower pole the spleen measuring approximately 1.8 cm (axial series 2 image 47). Adrenals/Urinary Tract: --Adrenal glands: No adrenal hemorrhage. --Right kidney/ureter: No hydronephrosis or perinephric hematoma. --Left kidney/ureter: No hydronephrosis or perinephric hematoma. --Urinary bladder: Unremarkable. Stomach/Bowel: --Stomach/Duodenum: No hiatal hernia or other gastric abnormality. Normal duodenal course and caliber. --Small bowel: No dilatation or inflammation. --Colon: No focal abnormality. --Appendix: Normal. Vascular/Lymphatic: Normal course and caliber of the major abdominal vessels. There is a retroaortic left renal vein, a normal variant. --there are prominent retroperitoneal lymph nodes. --there are prominent mesenteric lymph nodes. There is haziness throughout the mesentery. There are enlarged right lower quadrant lymph nodes measuring approximately 1.1 cm in diameter. --there are mildly enlarged pelvic and inguinal lymph nodes. Reproductive: There is a small amount of free fluid in the pelvis which may be physiologic. There is no discrete ovarian mass. Other: There is a small volume of pelvic free fluid which is likely physiologic. No free air. There is some mild fat stranding involving the anterior abdominal wall bilaterally. Musculoskeletal. No acute displaced fractures. IMPRESSION: 1. Scattered ground-glass airspace opacities, greatest within the right upper lobe, concerning for multifocal pneumonia. 2. Small bilateral pleural effusions with adjacent atelectasis. 3. Mild diffuse adenopathy throughout the chest, abdomen, and pelvis of unknown clinical significance. 4. Peripancreatic free fluid with edema within the root of the mesentery. Findings are nonspecific and can be seen in patients with pancreatitis, versus less likely sclerosing mesenteritis or inflammatory bowel disease. There are some  prominent lymph nodes right lower quadrant which can be seen in patients with mesenteric adenitis. 5. Normal appendix in the right lower quadrant. 6. Small volume free fluid in the pelvis favored to be physiologic. 7. Wedge-shaped hypoattenuating defect in the spleen of unknown clinical significance. Differential considerations include a splenic infarct versus less likely a splenic laceration, especially in the absence of a history of trauma. There is no perisplenic hematoma. Electronically Signed   By: Constance Holster M.D.   On: 08/02/2018 00:18   Ct Maxillofacial W Contrast  Result Date: 08/08/2018 CLINICAL DATA:  Fevers.  Right mandibular molar tooth pain. EXAM: CT MAXILLOFACIAL WITH CONTRAST TECHNIQUE: Multidetector CT imaging of the maxillofacial structures was performed with intravenous contrast. Multiplanar CT image reconstructions were also generated. CONTRAST:  26m OMNIPAQUE IOHEXOL 300 MG/ML  SOLN COMPARISON:  MRI 07/27/2018 FINDINGS: Osseous: No maxillary, mid face or upper face abnormality is seen. There is no evidence of active dental or periodontal disease. With respect to the right mandible, teeth 25 through 31 appear normal. Tooth 32 is absent and presumably has been extracted in the distant past. There is a benign appearing focus of hazy sclerosis measuring about 1.5 cm at the angle of the mandible on the right that could represent sequela of the previous extraction. If the  patient has not had extraction of tooth 32, then this could be some dysplastic change related to aplasia of the tooth. I do not see any likely significant finding in this region. Left mandibular molar remains in place, not fully erupted. Orbits: Normal Sinuses: Clear Soft tissues: Normal. No soft tissue inflammatory changes are evident. Limited intracranial: Normal IMPRESSION: No evidence of dental or periodontal decay or inflammatory disease. Absent right mandibular molar tooth. Presumably there has been distant  extraction. Hazy sclerosis within the mandible at the angle of the mandible could be chronic healed sequela of the molar tooth extraction. If there is not a history of molar tooth extraction, then there could be aplasia/hypoplasia of tooth 32. Electronically Signed   By: Nelson Chimes M.D.   On: 08/08/2018 11:49   Vas [place] Lower Extremity Venous (dvt)  Result Date: 08/06/2018  Lower Venous Study Indications: Immobility, fever.  Limitations: Patient positioning. Comparison Study: No prior Performing Technologist: Abram Sander RVS  Examination Guidelines: A complete evaluation includes B-mode imaging, spectral Doppler, color Doppler, and power Doppler as needed of all accessible portions of each vessel. Bilateral testing is considered an integral part of a complete examination. Limited examinations for reoccurring indications may be performed as noted.  +---------+---------------+---------+-----------+----------+--------------+ RIGHT    CompressibilityPhasicitySpontaneityPropertiesSummary        +---------+---------------+---------+-----------+----------+--------------+ CFV      Full           Yes      Yes                                 +---------+---------------+---------+-----------+----------+--------------+ SFJ      Full                                                        +---------+---------------+---------+-----------+----------+--------------+ FV Prox  Full                                                        +---------+---------------+---------+-----------+----------+--------------+ FV Mid   Full                                                        +---------+---------------+---------+-----------+----------+--------------+ FV DistalFull                                                        +---------+---------------+---------+-----------+----------+--------------+ PFV      Full                                                         +---------+---------------+---------+-----------+----------+--------------+ POP      Full  Yes      Yes                                 +---------+---------------+---------+-----------+----------+--------------+ PTV      Full                                                        +---------+---------------+---------+-----------+----------+--------------+ PERO                                                  Not visualized +---------+---------------+---------+-----------+----------+--------------+   +---------+---------------+---------+-----------+----------+--------------+ LEFT     CompressibilityPhasicitySpontaneityPropertiesSummary        +---------+---------------+---------+-----------+----------+--------------+ CFV      Full           Yes      Yes                                 +---------+---------------+---------+-----------+----------+--------------+ SFJ      Full                                                        +---------+---------------+---------+-----------+----------+--------------+ FV Prox  Full                                                        +---------+---------------+---------+-----------+----------+--------------+ FV Mid   Full                                                        +---------+---------------+---------+-----------+----------+--------------+ FV DistalFull                                                        +---------+---------------+---------+-----------+----------+--------------+ PFV      Full                                                        +---------+---------------+---------+-----------+----------+--------------+ POP      Full           Yes      Yes                                 +---------+---------------+---------+-----------+----------+--------------+ PTV      Full                                                         +---------+---------------+---------+-----------+----------+--------------+  PERO                                                  Not visualized +---------+---------------+---------+-----------+----------+--------------+     Summary: Right: There is no evidence of deep vein thrombosis in the lower extremity. No cystic structure found in the popliteal fossa. Left: There is no evidence of deep vein thrombosis in the lower extremity. No cystic structure found in the popliteal fossa.  *See table(s) above for measurements and observations. Electronically signed by Servando Snare MD on 08/06/2018 at 11:00:59 AM.    Final      The results of significant diagnostics from this hospitalization (including imaging, microbiology, ancillary and laboratory) are listed below for reference.     Microbiology: Recent Results (from the past 240 hour(s))  C difficile quick scan w PCR reflex     Status: None   Collection Time: 08/07/18 11:29 AM   Specimen: STOOL  Result Value Ref Range Status   C Diff antigen NEGATIVE NEGATIVE Final   C Diff toxin NEGATIVE NEGATIVE Final   C Diff interpretation No C. difficile detected.  Final    Comment: Performed at [facility], [place] [address], [place], [place] [address]  Culture, blood (Routine X 2) w Reflex to ID Panel     Status: None   Collection Time: 08/09/18  7:12 PM   Specimen: BLOOD LEFT HAND  Result Value Ref Range Status   Specimen Description   Final    BLOOD LEFT HAND Performed at [facility] [address] [place], [place] [address]    Special Requests   Final    BOTTLES DRAWN AEROBIC ONLY Blood Culture adequate volume Performed at [place] [address] [place], [place] [address]    Culture   Final    NO GROWTH 5 DAYS Performed at [facility], [place] [address], [place], [place] [address]    Report Status 08/14/2018 FINAL  Final  Culture, blood (Routine X 2) w Reflex to ID Panel      Status: None   Collection Time: 08/09/18  7:12 PM   Specimen: BLOOD LEFT HAND  Result Value Ref Range Status   Specimen Description   Final    BLOOD LEFT HAND Performed at [place] [address] [place], [place] [address]    Special Requests   Final    BOTTLES DRAWN AEROBIC ONLY Blood Culture adequate volume Performed at [place] [address] [place], [place] [address]    Culture   Final    NO GROWTH 5 DAYS Performed at [facility], [place] [address] [place], [place] [address]    Report Status 08/14/2018 FINAL  Final  Culture, blood (routine x 2)     Status: None (Preliminary result)   Collection Time: 08/11/18  2:59 PM   Specimen: BLOOD  Result Value Ref Range Status   Specimen Description   Final    BLOOD LEFT ANTECUBITAL Performed at [facility] [address], [place],  [address]    Special Requests   Final    BOTTLES DRAWN AEROBIC ONLY Blood Culture adequate volume Performed at [facility] [address] [place],  [address]    Culture   Final    NO GROWTH 4  DAYS Performed at [facility], [place] [address] [place], [place] [address]    Report Status PENDING  Incomplete  Culture, blood (routine x 2)     Status: None (Preliminary result)   Collection Time: 08/11/18  2:59 PM   Specimen: BLOOD LEFT HAND  Result Value Ref Range Status   Specimen Description   Final    BLOOD LEFT HAND Performed at [facility] [address], [place], [place] [address]    Special Requests   Final    BOTTLES DRAWN AEROBIC ONLY Blood Culture adequate volume Performed at [facility] [address] [place], [place] [address]    Culture   Final    NO GROWTH 4 DAYS Performed at [facility], [place] [address] [place],  [address]    Report Status PENDING  Incomplete     Labs: BNP (last 3 results) No results for  input(s): BNP in the last 8760 hours. Basic Metabolic Panel: Recent Labs  Lab 08/11/18 0441 08/12/18 1026 08/13/18 0354 08/15/18 0509 08/16/18 1045  NA 141 143 146* 148* 149*  K 4.3 3.5 3.9 4.0 3.6  CL 120* 118* 123* 123* 122*  CO2 18* 16* 16* 18* 15*  GLUCOSE 88 87 90 115* 118*  BUN '11 12 12 15 ' 26*  CREATININE 1.09* 0.92 0.92 0.85 1.06*  CALCIUM 7.2* 7.2* 7.3* 7.4* 7.5*  MG  --  1.4*  --   --  1.8  PHOS  --  1.7* 1.5*  --  2.2*   Liver Function Tests: Recent Labs  Lab 08/09/18 1912 08/13/18 0354  ALBUMIN 2.2* 1.9*   No results for input(s): LIPASE, AMYLASE in the last 168 hours. No results for input(s): AMMONIA in the last 168 hours. CBC: Recent Labs  Lab 08/10/18 1209 08/11/18 0441 08/12/18 1026 08/13/18 1243 08/14/18 0546 08/15/18 0509 08/16/18 1045  WBC 1.1* 1.3* 1.5* 1.5* 1.5* 2.0* 3.2*  NEUTROABS 0.7* 0.7* 0.9* 0.9* 0.9*  --   --   HGB 7.8* 8.1* 7.8* 7.8* 7.3* 7.5* 7.5*  HCT 26.0* 26.4* 26.2* 25.7* 24.6* 25.5* 25.6*  MCV 92.5 93.3 91.9 93.1 92.5 93.4 95.9  PLT 155 178 121* 115* 125*  117* 118* 134*   Cardiac Enzymes: No results for input(s): CKTOTAL, CKMB, CKMBINDEX, TROPONINI in the last 168 hours. BNP: Invalid input(s): POCBNP CBG: No results for input(s): GLUCAP in the last 168 hours. D-Dimer Recent Labs    08/14/18 0546  DDIMER 3.80*   Hgb A1c No results for input(s): HGBA1C in the last 72 hours. Lipid Profile No results for input(s): CHOL, HDL, LDLCALC, TRIG, CHOLHDL, LDLDIRECT in the last 72 hours. Thyroid function studies No results for input(s): TSH, T4TOTAL, T3FREE, THYROIDAB in the last 72 hours.  Invalid input(s): FREET3 Anemia work up No results for input(s): VITAMINB12, FOLATE, FERRITIN, TIBC, IRON, RETICCTPCT in the last 72 hours. Urinalysis    Component Value Date/Time   COLORURINE YELLOW 08/04/2018 2135   APPEARANCEUR HAZY (A) 08/04/2018 2135   LABSPEC 1.018 08/04/2018 2135   PHURINE 6.0 08/04/2018 2135   GLUCOSEU  NEGATIVE 08/04/2018 2135   HGBUR NEGATIVE 08/04/2018 2135   BILIRUBINUR NEGATIVE 08/04/2018 2135   BILIRUBINUR Neg 05/08/2010 1553   KETONESUR NEGATIVE 08/04/2018 2135   PROTEINUR 100 (A) 08/04/2018 2135   UROBILINOGEN 1.0 11/11/2013 0150   NITRITE NEGATIVE 08/04/2018 2135   LEUKOCYTESUR NEGATIVE 08/04/2018 2135   Sepsis Labs Invalid input(s): PROCALCITONIN,  WBC,  LACTICIDVEN Microbiology Recent Results (from  the past 240 hour(s))  C difficile quick scan w PCR reflex     Status: None   Collection Time: 08/07/18 11:29 AM   Specimen: STOOL  Result Value Ref Range Status   C Diff antigen NEGATIVE NEGATIVE Final   C Diff toxin NEGATIVE NEGATIVE Final   C Diff interpretation No C. difficile detected.  Final    Comment: Performed at [facility], [place] [address] [place], [place] [address]  Culture, blood (Routine X 2) w Reflex to ID Panel     Status: None   Collection Time: 08/09/18  7:12 PM   Specimen: BLOOD LEFT HAND  Result Value Ref Range Status   Specimen Description   Final    BLOOD LEFT HAND Performed at [facility] [address] [place], [place] [address]    Special Requests   Final    BOTTLES DRAWN AEROBIC ONLY Blood Culture adequate volume Performed at [place] [address], [place], [place] [address]    Culture   Final    NO GROWTH 5 DAYS Performed at [facility], [place] [address], [place], [place] [address]    Report Status 08/14/2018 FINAL  Final  Culture, blood (Routine X 2) w Reflex to ID Panel     Status: None   Collection Time: 08/09/18  7:12 PM   Specimen: BLOOD LEFT HAND  Result Value Ref Range Status   Specimen Description   Final    BLOOD LEFT HAND Performed at [place] [address] [place], [place] [address]    Special Requests   Final    BOTTLES DRAWN AEROBIC ONLY Blood Culture adequate volume Performed at [place]  [address] [place], [place] [address]    Culture   Final    NO GROWTH 5 DAYS Performed at [facility], [place] [address], [place], [place] [address]    Report Status 08/14/2018 FINAL  Final  Culture, blood (routine x 2)     Status: None (Preliminary result)   Collection Time: 08/11/18  2:59 PM   Specimen: BLOOD  Result Value Ref Range Status   Specimen Description   Final    BLOOD LEFT ANTECUBITAL Performed at [facility] [address], [place], [place] [address]    Special Requests   Final    BOTTLES DRAWN AEROBIC ONLY Blood Culture adequate volume Performed at [facility] [address], [place], [place] [address]    Culture   Final    NO GROWTH 4 DAYS Performed at [facility], [place] [address] [place], [place] [address]    Report Status PENDING  Incomplete  Culture, blood (routine x 2)     Status: None (Preliminary result)   Collection Time: 08/11/18  2:59 PM   Specimen: BLOOD LEFT HAND  Result Value Ref Range Status   Specimen Description   Final    BLOOD LEFT HAND Performed at [facility] [address], [place], [place] [address]    Special Requests   Final    BOTTLES DRAWN AEROBIC ONLY Blood Culture adequate volume Performed at [facility] [address], [place],  [address]    Culture   Final    NO GROWTH 4 DAYS Performed at [facility], [place] [address] [place],  [address]    Report Status PENDING  Incomplete     Time coordinating  discharge in minutes: 65  SIGNED:   Debbe Odea, MD  [facility] 08/16/2018, 11:20 AM Pager   If 7PM-7AM, please contact night-coverage [email or web address] Password [email or web address]

## 2018-08-19 ENCOUNTER — Telehealth: Payer: Self-pay | Admitting: *Deleted

## 2018-08-19 NOTE — Telephone Encounter (Signed)
Amy from [facility] calling for PT verbal orders as follows:  1 time(s) weekly for 3 week(s) to help with gait, balance and LE Strengthening.   You can leave verbal orders on confidential voicemail.  Christen Bame, CMA

## 2018-08-19 NOTE — Telephone Encounter (Signed)
-----   Message from [name], MD sent at 08/18/2018  3:34 PM EDT ----- Regarding: f/u appt Can we schedule this person a hospital follow up appointment with me?   Emily Hicks

## 2018-08-19 NOTE — Telephone Encounter (Signed)
Contacted pt and she is coming in on 7/22 @8 :39 for hospital f/u. April Zimmerman Rumple, CMA

## 2018-08-23 ENCOUNTER — Telehealth: Payer: Self-pay | Admitting: *Deleted

## 2018-08-23 NOTE — Telephone Encounter (Signed)
OT calls to inform us that her BP was 170/100 this am.   Her was up walking around and had her legs crossed.  Asked to have her wait for a few minutes and recheck.  Red flags given for ED/911  Wendelyn Breslow will call me back after 1:30 to report new reading.  Christen Bame, CMA

## 2018-08-23 NOTE — Telephone Encounter (Signed)
Emily Hicks from [facility] calling for OT verbal orders as follows:  2 time(s) weekly for 2 week(s)for decreased mobilty and ADLs  You can leave verbal orders on confidential voicemail.  Christen Bame, CMA

## 2018-08-23 NOTE — Telephone Encounter (Signed)
Mom called back to report a 160/100.  Pt is still asymptomatic.   They are unable to get transportation till Wednesday.  Advised to check BPs 2 time a day for if pt has palpitations, headache or dizziness.  To call 911 if chest pain , SOB or arm pain.  Mom agreeable, will keep record of BPs to bring to appt.  Christen Bame, CMA

## 2018-08-24 NOTE — Telephone Encounter (Signed)
Wendelyn Breslow called to check status.  Advised that MD would be in clinic tomorrow. Christen Bame, CMA

## 2018-08-25 ENCOUNTER — Encounter: Payer: Self-pay | Admitting: Family Medicine

## 2018-08-25 ENCOUNTER — Other Ambulatory Visit: Payer: Self-pay

## 2018-08-25 ENCOUNTER — Ambulatory Visit (INDEPENDENT_AMBULATORY_CARE_PROVIDER_SITE_OTHER): Payer: 59 | Admitting: Family Medicine

## 2018-08-25 VITALS — BP 130/80 | HR 84

## 2018-08-25 DIAGNOSIS — D649 Anemia, unspecified: Secondary | ICD-10-CM | POA: Diagnosis not present

## 2018-08-25 DIAGNOSIS — E878 Other disorders of electrolyte and fluid balance, not elsewhere classified: Secondary | ICD-10-CM

## 2018-08-25 DIAGNOSIS — E440 Moderate protein-calorie malnutrition: Secondary | ICD-10-CM

## 2018-08-25 DIAGNOSIS — G35 Multiple sclerosis: Secondary | ICD-10-CM | POA: Diagnosis not present

## 2018-08-25 DIAGNOSIS — M329 Systemic lupus erythematosus, unspecified: Secondary | ICD-10-CM

## 2018-08-25 DIAGNOSIS — D61818 Other pancytopenia: Secondary | ICD-10-CM

## 2018-08-25 NOTE — Telephone Encounter (Signed)
Called and approved

## 2018-08-25 NOTE — Progress Notes (Signed)
[facility] Phone: [phone]   cc: hospital followup for SLE and MS  Subjective:  Leave of absence last Monday.  Has 15 days.   SLE: the patient was diagnosed with Lupus during her last hospital admission.  She has an appointment with a rheumatologist soon.  Patient was started on 60 mg of prednisone daily which was decreased to 40 mg and she was discharged on this amount until 2 keep continue using it until her physician told her to decrease it.  MS: The patient was diagnosed with multiple sclerosis in March, after some "tingling" on her left side.  She states the symptoms have been going on since 2011 off and on.  She stopped having symptoms during her pregnancy.  Late last year symptoms worsened she had an MRI brain and spine that showed lesions on the left side of her spinal cord, dexamethasone 10.  She states she has had some back ache at the site of the spinal tap since it occurred.  Patient started taking Aubagio on June 1.  She stopped a week before she was admitted to the hospital because she was not feeling great and she was staying inflamed and could not function due to the pain.  Is having pain in her joints and knees.  She has appointment with a new neurologist in September, Dr. Gwenevere Abbot.  Patient is feeling a lot better since her discharge.  Her mother is working with her at her home on regaining her strength and ability to walk.  She says her balance is getting a little better.  It still takes her some time to comprehend things and has to think about what is going on.  Thrush: Patient had significant thrush on admission.  She continue taking Diflucan after discharge and has since finished the course.  Malnutrtion: Patient states her appetite is better.  Before she said everything tasted bad, and she was not eating or drinking anything.    Anemia: Patient had a blood transfusion in the hospital.  She denied bone marrow biopsy.  She still does not want a bone  marrow biopsy to work-up the cause of her anemia.  ROS: See HPI for pertinent positives and negatives  Past Medical History  Family history reviewed for today's visit. No changes.  Social history- patient is a non-smoker  Objective: BP 130/80   Pulse 84   SpO2 100%  Gen: NAD, alert and oriented, cooperative with exam HEENT: NCAT, EOMI, MMM.  No thrush seen on tongue Neck: FROM, supple, no masses CV: normal rate, regular rhythm.  1/6 systolic murmurs, no rubs.  Resp: LCTAB, no wheezes, crackles. normal work of breathing GI: nontender to palpation, BS present, no guarding or organomegaly Msk: No edema.  Patient's gait is slow and deliberate.  Take short steps.  Was able to rise from sitting position to standing on her own. Neuro: CN II-XII grossly intact. no gross deficits Skin: A few hyperpigmented papules on palms. Psych: Appropriate behavior  Assessment/Plan: MS (multiple sclerosis) ([place]) Patient symptoms improving since discharge.  States her strength and gait are improved.  Patient able to stand up on her own but still walks with short deliberate gait.  Has been taken off the Aubagio.  Currently only taking prednisone which was prescribed in the hospital for her lupus exacerbation, but is helping with MS symptoms as well. -Continue prednisone at current 40 mg daily until patient talks with rheumatologist next week. - Follow-up in 4 weeks. -Filled out FMLA paperwork  for patient's mother who is living with her for the time being to help her with ADLs and rehabilitation.  SLE exacerbation ([place]) Patient has had positive anti-dsDNA, ANA,anti-smith, anti-RNP, Anti-SSA.  She was started on prednisone in the hospital and told to continue outpatient.  Patient has follow-up appointment with rheumatology next week.  We will continue prednisone at current dose until patient speaks with them.  Patient's thrush and pancytopenia likely due to SLE exacerbation - Continue prednisone 40 mg daily  - CBC  Electrolyte imbalance Patient had hyponatremia and hypo-kalemia, hypomagnesemia, hypophosphatemia in the hospital. - CMP to check electrolytes. -We will replete as needed.  Protein-calorie malnutrition ([place]) Patient was not eating or drinking adequately prior to admission due to dysfunction in her sense of taste.  Patient states her appetite is better now.  Encourage patient to eat plenty of calories, and used nutritional supplements such as boost. - CMP to check for albumin  Clemetine Marker, MD PGY-2

## 2018-08-25 NOTE — Patient Instructions (Addendum)
It was nice to meet you today,  I would like you to call your neurologist to make sure that they do not want you to come sooner since you have just been to the hospital.  If they say he can still be seen in September, then that should be okay.  Your rheumatologist tomorrow will discuss your lupus more thoroughly, and make any medication changes if needed.  You should continue taking 40 mg prednisone in the meantime.  I will fill out this form today and someone will call you when it is ready to be picked up.  You can ask the front desk for it when it is ready.  Your blood pressure looked good today, therefore we did not need to change in your medication.  I would try to avoid taking her blood pressure too much at home, and try to keep in mind that blood pressure fluctuates throughout the day and 1 elevated reading does not necessarily mean that you need to take an additional blood pressure medicine or come to the doctor.  Glad your appetite is improved, continue to eat healthy foods and supplement with the nutritional shakes.  Have a great day,  Clemetine Marker, MD

## 2018-08-26 ENCOUNTER — Telehealth: Payer: Self-pay | Admitting: Family Medicine

## 2018-08-26 LAB — CBC
Hematocrit: 24.4 % — ABNORMAL LOW (ref 34.0–46.6)
Hemoglobin: 7.7 g/dL — ABNORMAL LOW (ref 11.1–15.9)
MCH: 29.2 pg (ref 26.6–33.0)
MCHC: 31.6 g/dL (ref 31.5–35.7)
MCV: 92 fL (ref 79–97)
Platelets: 180 10*3/uL (ref 150–450)
RBC: 2.64 x10E6/uL — CL (ref 3.77–5.28)
RDW: 20.8 % — ABNORMAL HIGH (ref 11.7–15.4)
WBC: 12 10*3/uL — ABNORMAL HIGH (ref 3.4–10.8)

## 2018-08-26 LAB — COMPREHENSIVE METABOLIC PANEL
ALT: 54 IU/L — ABNORMAL HIGH (ref 0–32)
AST: 35 IU/L (ref 0–40)
Albumin/Globulin Ratio: 1.1 — ABNORMAL LOW (ref 1.2–2.2)
Albumin: 2.9 g/dL — ABNORMAL LOW (ref 3.8–4.8)
Alkaline Phosphatase: 72 IU/L (ref 39–117)
BUN/Creatinine Ratio: 12 (ref 9–23)
BUN: 9 mg/dL (ref 6–20)
Bilirubin Total: 0.6 mg/dL (ref 0.0–1.2)
CO2: 22 mmol/L (ref 20–29)
Calcium: 8.1 mg/dL — ABNORMAL LOW (ref 8.7–10.2)
Chloride: 105 mmol/L (ref 96–106)
Creatinine, Ser: 0.75 mg/dL (ref 0.57–1.00)
GFR calc Af Amer: 122 mL/min/{1.73_m2} (ref >59)
GFR calc non Af Amer: 106 mL/min/{1.73_m2} (ref >59)
Globulin, Total: 2.7 g/dL (ref 1.5–4.5)
Glucose: 102 mg/dL — ABNORMAL HIGH (ref 65–99)
Potassium: 2.5 mmol/L — ABNORMAL LOW (ref 3.5–5.2)
Sodium: 142 mmol/L (ref 134–144)
Total Protein: 5.6 g/dL — ABNORMAL LOW (ref 6.0–8.5)

## 2018-08-26 NOTE — Telephone Encounter (Signed)
Called pt to inform her that FMLA paperwork is ready to be picked up.

## 2018-08-28 DIAGNOSIS — E878 Other disorders of electrolyte and fluid balance, not elsewhere classified: Secondary | ICD-10-CM | POA: Insufficient documentation

## 2018-08-28 DIAGNOSIS — E46 Unspecified protein-calorie malnutrition: Secondary | ICD-10-CM | POA: Insufficient documentation

## 2018-08-28 NOTE — Assessment & Plan Note (Signed)
Patient symptoms improving since discharge.  States her strength and gait are improved.  Patient able to stand up on her own but still walks with short deliberate gait.  Has been taken off the Aubagio.  Currently only taking prednisone which was prescribed in the hospital for her lupus exacerbation, but is helping with MS symptoms as well. -Continue prednisone at current 40 mg daily until patient talks with rheumatologist next week. - Follow-up in 4 weeks. -Filled out FMLA paperwork for patient's mother who is living with her for the time being to help her with ADLs and rehabilitation.

## 2018-08-28 NOTE — Assessment & Plan Note (Signed)
Patient had hyponatremia and hypo-kalemia, hypomagnesemia, hypophosphatemia in the hospital. - CMP to check electrolytes. -We will replete as needed.

## 2018-08-28 NOTE — Assessment & Plan Note (Addendum)
Patient has had positive anti-dsDNA, ANA,anti-smith, anti-RNP, Anti-SSA.  She was started on prednisone in the hospital and told to continue outpatient.  Patient has follow-up appointment with rheumatology next week.  We will continue prednisone at current dose until patient speaks with them.  Patient's thrush and pancytopenia likely due to SLE exacerbation - Continue prednisone 40 mg daily - CBC

## 2018-08-28 NOTE — Assessment & Plan Note (Signed)
Patient was not eating or drinking adequately prior to admission due to dysfunction in her sense of taste.  Patient states her appetite is better now.  Encourage patient to eat plenty of calories, and used nutritional supplements such as boost. - CMP to check for albumin

## 2018-08-28 NOTE — Assessment & Plan Note (Signed)
Patient had decrease in all 3 cell lines in the hospital.  She required 1 unit of packed red blood cells.  At discharge hemoglobin was 7.5.  Patient denied request for bone marrow biopsy in the hospital as well as today. - CBC ordered, follow-up as appropriate. 

## 2018-08-29 ENCOUNTER — Other Ambulatory Visit: Payer: Self-pay | Admitting: Family Medicine

## 2018-08-29 DIAGNOSIS — E878 Other disorders of electrolyte and fluid balance, not elsewhere classified: Secondary | ICD-10-CM

## 2018-08-29 MED ORDER — POTASSIUM CHLORIDE ER 20 MEQ PO TBCR: 40.0000 meq | EXTENDED_RELEASE_TABLET | Freq: Every day | ORAL | 0 refills | Status: DC

## 2018-08-29 NOTE — Progress Notes (Signed)
Called patient to discuss her CBC and CMP results, specifically her low potassium levels.  Told patient I was prescribing her potassium supplement of 40 mEq daily.  Told her I would like her to come back in the next week to 2 weeks to get blood work, notably potassium, magnesium and phosphate levels.  Patient said she recently went to rheumatologist and said they prescribed her a medication that sounded like hydrochlorothiazide.  I do not have any notes of this in epic.  If this is the case, would need to clarify with rheumatology as to the purpose of the HCTZ and concern for further worsening her hypokalemia.  Clemetine Marker, PGY 2

## 2018-08-31 ENCOUNTER — Telehealth: Payer: Self-pay | Admitting: Family Medicine

## 2018-08-31 NOTE — Telephone Encounter (Signed)
Pt called about her short term disability, and wanted to know who she should use as the primary doctor on her paperwork. Please give pt a call back.

## 2018-08-31 NOTE — Telephone Encounter (Signed)
Informed pt that Dr. Jeannine Kitten is her PCP and she asked would he be able to complete the form if it was pertaining to her Rheumatologist and I told her no that he could answer anything pertaining to our office and her previous PCP Dr. Shawna [place]. Emily Hicks, CMA

## 2018-09-01 ENCOUNTER — Telehealth: Payer: Self-pay | Admitting: Neurology

## 2018-09-01 ENCOUNTER — Telehealth: Payer: Self-pay | Admitting: *Deleted

## 2018-09-01 ENCOUNTER — Telehealth: Payer: Self-pay | Admitting: Oncology

## 2018-09-01 ENCOUNTER — Other Ambulatory Visit: Payer: Self-pay | Admitting: Family Medicine

## 2018-09-01 DIAGNOSIS — D649 Anemia, unspecified: Secondary | ICD-10-CM

## 2018-09-01 NOTE — Telephone Encounter (Signed)
Pt returning call please call back  Offered appt , but pt is requesting to speak with someone

## 2018-09-01 NOTE — Telephone Encounter (Signed)
Patient had questions about the CBC appointment that was trying to be scheduled for the [facility]. She told scheduler she did not recall meeting Dr. Benay Spice. Left VM for her that she was seen in the hospital on 08/16/18 by Dr. Gearldine Shown NP, Mikey Bussing and needed to recheck her counts. Requested return call to nurse to schedule the appointment.

## 2018-09-01 NOTE — Telephone Encounter (Signed)
I have talked with her PCP Dr. Tim Lair, patient was recently diagnosed with lupus, is considering treating her with CellCept,  Patient was started on Aubagio for diagnosis of multiple sclerosis in June 2020, which has stopped, the diagnosis was based on abnormal MRI of cervical spine, CSF showed more than 5 oligoclonal banding, MRI of the brain showed no significant abnormality,  Patient want to switch to MS specialist Dr. Felecia Shelling, please schedule follow-up with Dr. Felecia Shelling

## 2018-09-01 NOTE — Telephone Encounter (Signed)
Tried calling pt but went to VM and VM full. Was going to offer appt with Dr. Felecia Shelling on Monday 09/06/18 at 1:00pm if she can take that

## 2018-09-01 NOTE — Telephone Encounter (Signed)
Call placed to the patient's cell number.  I had to leave a voicemail for her to let her know that we would like to recheck her CBC within the next few days.  Scheduling message has been sent to arrange for an appointment.  I left the number to the cancer center to call if she had any questions.  She was advised to assess for Dr. Gearldine Shown nurse when calling the cancer center.  Mikey Bussing, DNP, AGPCNP-BC, AOCNP

## 2018-09-02 NOTE — Telephone Encounter (Addendum)
Called pt back. She saw Rheumatologist and was told they were going to contact our office about her recent diagnoses of lupus. Advised Dr. Krista Blue spoke with them yesterday per phone note. Pt wants to discuss more about possible lupus dx and her MS dx. She is still wanting to switch care to Dr. Felecia Shelling. I scheduled appt for 09/08/18 at 11am with Dr. Felecia Shelling. Asked that she check in at 1030/1045am, wear a mask and temp will be checked. She verbalized understanding.

## 2018-09-03 ENCOUNTER — Telehealth: Payer: Self-pay | Admitting: *Deleted

## 2018-09-03 NOTE — Telephone Encounter (Signed)
done

## 2018-09-03 NOTE — Telephone Encounter (Signed)
Cat from [facility] calling for OT verbal orders as follows:  1 time(s) weekly for 1 week(s), then 2 time(s) weekly for 1 week(s), then 1 time(s) weekly for 1 week(s)  You can leave verbal orders on confidential voicemail.  Christen Bame, CMA

## 2018-09-03 NOTE — Telephone Encounter (Signed)
Left 2nd VM reporting need for CBC in next week. Reminded her again Dr. Benay Spice was consulted hematologist that saw her in the hospital. Please call to schedule lab or be sure your PCP does this.

## 2018-09-08 ENCOUNTER — Telehealth (INDEPENDENT_AMBULATORY_CARE_PROVIDER_SITE_OTHER): Payer: 59 | Admitting: Family Medicine

## 2018-09-08 ENCOUNTER — Other Ambulatory Visit: Payer: Self-pay

## 2018-09-08 ENCOUNTER — Ambulatory Visit (INDEPENDENT_AMBULATORY_CARE_PROVIDER_SITE_OTHER): Payer: 59 | Admitting: Neurology

## 2018-09-08 ENCOUNTER — Encounter: Payer: Self-pay | Admitting: Neurology

## 2018-09-08 ENCOUNTER — Telehealth: Payer: Self-pay

## 2018-09-08 VITALS — BP 137/100 | HR 121 | Temp 98.4°F | Ht 61.0 in | Wt 187.5 lb

## 2018-09-08 DIAGNOSIS — F32A Depression, unspecified: Secondary | ICD-10-CM

## 2018-09-08 DIAGNOSIS — R748 Abnormal levels of other serum enzymes: Secondary | ICD-10-CM | POA: Diagnosis not present

## 2018-09-08 DIAGNOSIS — R609 Edema, unspecified: Secondary | ICD-10-CM

## 2018-09-08 DIAGNOSIS — G35 Multiple sclerosis: Secondary | ICD-10-CM | POA: Diagnosis not present

## 2018-09-08 DIAGNOSIS — E870 Hyperosmolality and hypernatremia: Secondary | ICD-10-CM | POA: Diagnosis not present

## 2018-09-08 DIAGNOSIS — F419 Anxiety disorder, unspecified: Secondary | ICD-10-CM

## 2018-09-08 DIAGNOSIS — R269 Unspecified abnormalities of gait and mobility: Secondary | ICD-10-CM

## 2018-09-08 DIAGNOSIS — F329 Major depressive disorder, single episode, unspecified: Secondary | ICD-10-CM

## 2018-09-08 DIAGNOSIS — R200 Anesthesia of skin: Secondary | ICD-10-CM

## 2018-09-08 DIAGNOSIS — M329 Systemic lupus erythematosus, unspecified: Secondary | ICD-10-CM

## 2018-09-08 MED ORDER — ALPRAZOLAM 1 MG PO TABS: ORAL_TABLET | ORAL | 0 refills | Status: DC

## 2018-09-08 NOTE — Assessment & Plan Note (Addendum)
Noted to bilateral feet and ankles per patient report though difficult to assess over the phone.  Given patient's recent complicated hospital stay with subsequent new immunocompromising medications, recommend in person office visit to fully evaluate.  Per chart review, also has anemia with hemoglobin 7.7 on last check and required a blood transfusion during her hospital stay, so would also likely need labs at that time.  Would also need to recheck blood pressure and heart rate, recommended patient bring her home blood pressure cuff for calibration.  Patient has appointment in a few weeks already scheduled, reviewed return precautions and patient will make appointment if needs more emergent attention.

## 2018-09-08 NOTE — Telephone Encounter (Signed)
yes

## 2018-09-08 NOTE — Telephone Encounter (Signed)
187lb  BP- N/A  T- NO FEVER  [id number] - [place], [place] [address] AT

## 2018-09-08 NOTE — Progress Notes (Signed)
[facility] Telemedicine Visit  Patient consented to have virtual visit. Method of visit: Telephone  Encounter participants: Patient: Emily Hicks - located at home Provider: Rory Percy - located at [facility] Others (if applicable): n/a  Chief Complaint: Leg swelling, high blood pressure  HPI:  Patient states she has been noticing labile blood pressure recently with highest BP 135/115 with heart rates 117-121 at home and wonders if she needs to be on medication for this.  Has also been noticing swelling in her feet and ankles off and on since she was discharged from the hospital 07/2018 but have been worsened recently in the last week.  She was hospitalized for new onset SLE exacerbation, has recently established with rheumatology and is currently taking prednisone and Plaquenil which are both new medications for her.  The swelling is bilateral but sometimes notes her left may be greater than her right.  The swelling goes down when she raises her legs.  She notes that she has been urinating more frequently.  She is prescribed metoprolol for blood pressure control and has been taking this as directed without any missed doses.  She is prescribed duloxetine and as needed Xanax for anxiety but has not taken these in quite some time.  She denies any history of kidney, heart, liver problems or history of cancer.  She denies fevers, chest pain, shortness of breath, recent immobility, dark or bloody bowel movements, history of blood clots.  ROS: per HPI  Pertinent PMHx: obesity, anxiety/depression, migraine, MS, SLE, hidradenitis  Exam:  Respiratory: Speaks in full sentences, no respiratory distress  Assessment/Plan:  Swelling Noted to bilateral feet and ankles per patient report though difficult to assess over the phone.  Given patient's recent complicated hospital stay with subsequent new immunocompromising medications, recommend in person office visit to fully evaluate.   Per chart review, also has anemia with hemoglobin 7.7 on last check and required a blood transfusion during her hospital stay, so would also likely need labs at that time.  Would also need to recheck blood pressure and heart rate, recommended patient bring her home blood pressure cuff for calibration.  Patient has appointment in a few weeks already scheduled, reviewed return precautions and patient will make appointment if needs more emergent attention.   Time spent during visit with patient: 15 minutes

## 2018-09-08 NOTE — Progress Notes (Signed)
[facility]  PATIENT: Emily Hicks DOB: 25-Sep-1985  REFERRING DOCTOR OR PCP:  Dr. Jeannine Kitten (PCP) SOURCE: patient, lab reports, MRI reports, multiple MRI images personally reviewed.  _________________________________   HISTORICAL  CHIEF COMPLAINT:  Chief Complaint  Patient presents with  . Follow-up    RM 13 with mother, Pearl (temp: 98.0). Last saw Dr. Krista Blue 07/06/2018. Switching care from Dr. Krista Blue to Dr. Felecia Shelling. Wanting to discuss recent dx of lupus and MS dx. Mother also concerned about her daughter's high BP. PCP aware but she feels her medications need to be changed. I did discuss PCP would need to make adjustments. She can monitor BP more closely at home, taking at same time every day. She would like to know Dr. Garth Bigness recommendation still.  . Gait Problem    Ambulates with walker.    HISTORY OF PRESENT ILLNESS:  Emily Hicks was seen for an internal second opinion regarding her multiple sclerosis.  She is a 33 year old woman who was diagnosed with MS in early 2020 but has had symptoms since 2010.     The history of her symptoms and diagnoses are below.  Multiple MRIs were personally reviewed and summarized below.  Shortly after starting Aubagio she stopped due to some side effects of not feeling good.  She continues to feel worse and presented to the emergency room 07/27/2018 and was admitted with multiple symptoms including fever, elevated liver function tests, hypernatremia and suspected infection.  Admission note, discharge noted and multiple laboratory tests and imaging studies from the study were reviewed.  Starting a couple weeks before her admission she was fully weaker and weaker and had more difficulty with her gait.  A few days before admission she also had difficulty with cognition and became a little lethargic.  She did physical therapy joint and after the hospitalization.  Currently she is using a walker but is able to walk without one.   She notes  reduced strength in her legs and arms.  She is better but not back to her baseline.  Vision is fine.   She has some urge incontinence.      She has daily fatigue.  Sleep is variable.  She feels cognition is back to baseline though it was off for a couple weeks before and during her admission.  She has depression.  She sees Dr. Kathlene [facility]) for her SLE.     MS Clinical course:   Around 2010, she had numbness/tingling in her left thigh and cramps in her toes.   While pregnant a little time later, she improved.   After her sone was born, the left leg leg symptoms returned.   She saw Dr. Krista Blue and an MRI of the brain showed a few scattered foci but was mostly nonspecific.   She had more intense numbness in the left leg but also numbness in the left face and a repeat 2016 brain MRI showed a few more lesions.    Numbness symptoms did not improve but early 2020 she had more intense numbness and an MRI of the brain and cervical spine was performed.   She had 3-4 lesions in her cervical spine and the brian showed no definite less lesions.   An LP ws then performed and showed > 5 oligoclonal bands.   MS was diagnosed.    She started Aubagio and took only one week before having a hospitalization for fever, hypernatremia, joint inflammation, thrush) and it was stopped.    First  seen by Dr. Felecia Shelling 09/08/2018.    MRI brain 07/27/18: Formal report was Normal - does show a few small T2/Flair lesions, 2 periventricular and the rest deep white matter MRI cervical spine 03/24/18 shows 3-4 lesions (C4 left, C5 left and C7 right also C2 on sagittal only) c/w demyelinating plaque. MRI brain 03/25/18:  Some scattered T2/Flair foci in hemispheres 1-2 perventricular MRI lumbar 08/02/18:  Normal  11/15/2014: By the report:   6 periventricular and subcortical foci - 1 new focus in left posterior temporal white matter and 2 punctate left frontal lobe lesions compared to 2011.   Other lesions stable.  I also see a  right cerebral peduncle lesion that may explain her symptoms better 08/08/2009:  Few small foci  MS and other Lab Studies:  CSF showed > 5 OCB and increased IgG index (1.57) 04/16/2018.   Positive ANA with increased dsDNA, ENARNP, ENA SM, SSA.   ESR = 27, Vit D low (15-17)  Other medical problems: She has SLE and is on Plaquenil (Dr. Kathlene November [phone])  She was hospitalized for SLE, hypernatremia, elevated LFTs and thrush 07/27/18,   Of note LFTs were normal 09/08/2018.   She did take Aubagio x 1 week before she was hospitalized.     REVIEW OF SYSTEMS: Constitutional: No fevers, chills, sweats, or change in appetite Eyes: No visual changes, double vision, eye pain Ear, nose and throat: No hearing loss, ear pain, nasal congestion, sore throat Cardiovascular: No chest pain, palpitations Respiratory: No shortness of breath at rest or with exertion.   No wheezes GastrointestinaI: No nausea, vomiting, diarrhea, abdominal pain, fecal incontinence Genitourinary: No dysuria, urinary retention or frequency.  No nocturia. Musculoskeletal: No neck pain, back pain Integumentary: No rash, pruritus, skin lesions Neurological: as above Psychiatric: No depression at this time.  No anxiety Endocrine: No palpitations, diaphoresis, change in appetite, change in weigh or increased thirst Hematologic/Lymphatic: No anemia, purpura, petechiae. Allergic/Immunologic: No itchy/runny eyes, nasal congestion, recent allergic reactions, rashes  ALLERGIES: No Known Allergies  HOME MEDICATIONS:  Current Outpatient Medications:  .  folic acid (FOLVITE) 1 MG tablet, Take 2 tablets (2 mg total) by mouth daily., Disp: 60 tablet, Rfl: 0 .  hydroxychloroquine (PLAQUENIL) 200 MG tablet, Take 400 mg by mouth daily., Disp: , Rfl:  .  magnesium oxide (MAG-OX) 400 (241.3 Mg) MG tablet, Take 1 tablet (400 mg total) by mouth 2 (two) times daily., Disp: 60 tablet, Rfl: 0 .  medroxyPROGESTERone (DEPO-PROVERA) 150 MG/ML injection,  Inject 1 mL (150 mg total) into the muscle every 3 (three) months., Disp: 1 mL, Rfl: 11 .  metoprolol tartrate (LOPRESSOR) 25 MG tablet, Take 1 tablet (25 mg total) by mouth 2 (two) times daily., Disp: 60 tablet, Rfl: 0 .  Multiple Vitamin (MULTIVITAMIN WITH MINERALS) TABS tablet, Take 1 tablet by mouth daily., Disp: 30 tablet, Rfl: 0 .  potassium chloride 20 MEQ TBCR, Take 40 mEq by mouth daily., Disp: 60 tablet, Rfl: 0 .  predniSONE (DELTASONE) 20 MG tablet, Take 60 mg daily for 7 days and then decrease to 40 mg daily. Your doctor will tell you how to reduce if further. DO NOT STOP SUDDENLY (Patient taking differently: Take 30 mg by mouth daily. Started 21m 09/07/18- will take for 2 weeks and then decrease to 262mdaily), Disp: 60 tablet, Rfl: - .  traZODone (DESYREL) 50 MG tablet, Take 1 tablet (50 mg total) by mouth at bedtime., Disp: 30 tablet, Rfl: 0 .  Amino Acids-Protein Hydrolys (FEEDING SUPPLEMENT,  PRO-STAT SUGAR FREE 64,) LIQD, Take 30 mLs by mouth 2 (two) times daily. (Patient not taking: Reported on 09/08/2018), Disp: 887 mL, Rfl: 0 .  DULoxetine (CYMBALTA) 30 MG capsule, Take 3 capsules (90 mg total) by mouth daily. (Patient not taking: Reported on 09/08/2018), Disp: 90 capsule, Rfl: 0 .  feeding supplement, ENSURE ENLIVE, (ENSURE ENLIVE) LIQD, Take 237 mLs by mouth daily. (Patient not taking: Reported on 09/08/2018), Disp: 237 mL, Rfl: 12 .  fluconazole (DIFLUCAN) 100 MG tablet, Take 1 tablet (100 mg total) by mouth daily. (Patient not taking: Reported on 09/08/2018), Disp: 7 tablet, Rfl: 0 .  nystatin (MYCOSTATIN) 100000 UNIT/ML suspension, Take 5 mLs (500,000 Units total) by mouth 4 (four) times daily. (Patient not taking: Reported on 09/08/2018), Disp: 60 mL, Rfl: 0 No current facility-administered medications for this visit.   Facility-Administered Medications Ordered in Other Visits:  .  gadopentetate dimeglumine (MAGNEVIST) injection 20 mL, 20 mL, Intravenous, Once PRN, Marcial Pacas, MD  PAST  MEDICAL HISTORY: Past Medical History:  Diagnosis Date  . Gallstones   . GERD (gastroesophageal reflux disease)    only prn OTC occasionally  . Headache   . Multiple sclerosis ([place])   . Numbness and tingling of left leg   . Numbness and tingling of right leg     PAST SURGICAL HISTORY: Past Surgical History:  Procedure Laterality Date  . CHOLECYSTECTOMY N/A 12/28/2013   Procedure: LAPAROSCOPIC CHOLECYSTECTOMY;  Surgeon: Fanny Skates, MD;  Location: [facility];  Service: General;  Laterality: N/A;    FAMILY HISTORY: Family History  Problem Relation Age of Onset  . Hypertension Father   . Hypertension Mother   . Hypertension Sister   . Diabetes Maternal Grandmother   . Diabetes Maternal Grandfather     SOCIAL HISTORY:  Social History   Socioeconomic History  . Marital status: Married    Spouse name: Not on file  . Number of children: 1  . Years of education: 51  . Highest education level: Bachelor's degree (e.g., BA, AB, BS)  Occupational History  . Occupation: Press photographer  Social Needs  . Financial resource strain: Not on file  . Food insecurity    Worry: Not on file    Inability: Not on file  . Transportation needs    Medical: Not on file    Non-medical: Not on file  Tobacco Use  . Smoking status: Never Smoker  . Smokeless tobacco: Never Used  Substance and Sexual Activity  . Alcohol use: No  . Drug use: No  . Sexual activity: Not Currently  Lifestyle  . Physical activity    Days per week: Not on file    Minutes per session: Not on file  . Stress: Not on file  Relationships  . Social Herbalist on phone: Not on file    Gets together: Not on file    Attends religious service: Not on file    Active member of club or organization: Not on file    Attends meetings of clubs or organizations: Not on file    Relationship status: Not on file  . Intimate partner violence    Fear of current or ex partner: Not on file    Emotionally abused: Not on file     Physically abused: Not on file    Forced sexual activity: Not on file  Other Topics Concern  . Not on file  Social History Narrative   Lives at home with husband and son.  Right-handed.   No caffeine use.     PHYSICAL EXAM  Vitals:   09/08/18 1100  BP: (!) 137/100  Pulse: (!) 121  Temp: 98.4 F (36.9 C)  Weight: 187 lb 8 oz (85 kg)  Height: '5\' 1"'  (1.549 m)    Body mass index is 35.43 kg/m.   General: The patient is well-developed and well-nourished and in no acute distress  HEENT:  Head is [place]/AT.  Sclera are anicteric.  Funduscopic exam shows normal optic discs and retinal vessels.  Neck: No carotid bruits are noted.  The neck is nontender.  Cardiovascular: The heart has a regular rate and rhythm with a normal S1 and S2. There were no murmurs, gallops or rubs.    Skin: Extremities are without rash or  edema.  Musculoskeletal:  Back is nontender  Neurologic Exam  Mental status: The patient is alert and oriented x 3 at the time of the examination. The patient has apparent normal recent and remote memory, with an apparently normal attention span and concentration ability.   Speech is normal.  Cranial nerves: Extraocular movements are full. Pupils are equal, round, and reactive to light and accomodation.  Visual fields are full.  Facial symmetry is present. There is reduced left facial sensation to soft touch .Facial strength is normal.  Trapezius and sternocleidomastoid strength is normal. No dysarthria is noted.  The tongue is midline, and the patient has symmetric elevation of the soft palate. No obvious hearing deficits are noted.  Motor:  Muscle bulk is normal.   Tone is normal. Strength is  5 / 5 in all 4 extremities except for plus/5 left EHL  Sensory: Sensory testing is intact to pinprick, soft touch and vibration sensation on the right but reduced in the left arm and leg  Coordination: Cerebellar testing reveals good finger-nose-finger but reduced  heel-to-shin bilaterally.  Gait and station: Station is normal.   The gait has reduced stride and is wide.  She cannot tandem walk. Romberg is borderline.   Reflexes: Deep tendon reflexes are symmetric and normal bilaterally.   Plantar responses are flexor.    DIAGNOSTIC DATA (LABS, IMAGING, TESTING) - I reviewed patient records, labs, notes, testing and imaging myself where available.  Lab Results  Component Value Date   WBC 12.0 (H) 08/25/2018   HGB 7.7 (L) 08/25/2018   HCT 24.4 (L) 08/25/2018   MCV 92 08/25/2018   PLT 180 08/25/2018      Component Value Date/Time   NA 142 08/25/2018 1056   K 2.5 (L) 08/25/2018 1056   CL 105 08/25/2018 1056   CO2 22 08/25/2018 1056   GLUCOSE 102 (H) 08/25/2018 1056   GLUCOSE 118 (H) 08/16/2018 1045   BUN 9 08/25/2018 1056   CREATININE 0.75 08/25/2018 1056   CREATININE 0.71 10/29/2010 0934   CALCIUM 8.1 (L) 08/25/2018 1056   PROT 5.6 (L) 08/25/2018 1056   ALBUMIN 2.9 (L) 08/25/2018 1056   ALBUMIN 4.0 04/16/2018 1225   AST 35 08/25/2018 1056   ALT 54 (H) 08/25/2018 1056   ALKPHOS 72 08/25/2018 1056   BILITOT 0.6 08/25/2018 1056   GFRNONAA 106 08/25/2018 1056   GFRAA 122 08/25/2018 1056   Lab Results  Component Value Date   CHOL 79 12/20/2015   HDL 31 (L) 12/20/2015   LDLCALC 34 12/20/2015   TRIG 71 12/20/2015   CHOLHDL 2.5 12/20/2015   Lab Results  Component Value Date   HGBA1C 5.4 06/07/2012   Lab Results  Component Value  Date   AGTXMIWO03 212 08/09/2018   Lab Results  Component Value Date   TSH 0.901 08/01/2018       ASSESSMENT AND PLAN  1. MS (multiple sclerosis) ([place])   2. Multiple sclerosis ([place])   3. Anxiety and depression   4. Elevated liver enzymes   5. Hypernatremia   6. SLE exacerbation ([place])   7. Gait disturbance   8. Numbness    1.   I will speak to Dr. Kathlene November so that we can usually decide upon disease modifying therapies that might be of benefit to her SLE and MS. 2/   Due to her continued poor  gait, we need to also check an MRI of the thoracic spine to determine if there are demyelinating plaques or other process 3.   I will speak to her after we decide upon a disease modifying therapy.  She never really was on Aubagio for over a week so we could reconsider that again. 4.    She will return to see me in 2 to 3 months but sooner based on a treatment decision.   Richard A. Felecia Shelling, MD, [facility] 03/09/8248, 03:70 AM Certified in Neurology, Clinical Neurophysiology, Sleep Medicine and Neuroimaging  [facility] [address], [place], [place] [address] [phone]

## 2018-09-08 NOTE — Telephone Encounter (Signed)
Was Amy called? Christen Bame, CMA

## 2018-09-13 ENCOUNTER — Telehealth: Payer: Self-pay | Admitting: Neurology

## 2018-09-13 NOTE — Telephone Encounter (Signed)
[employer] pending faxed notes 

## 2018-09-13 NOTE — Telephone Encounter (Signed)
no to the covid-19 questions MR Thoracic spine wo contrast Dr. Felecia Shelling [facility] Auth: [phone] (exp. 09/13/18 to 10/28/18). Patient is scheduled at [facility] for 09/14/18.

## 2018-09-14 ENCOUNTER — Ambulatory Visit: Payer: 59

## 2018-09-14 ENCOUNTER — Other Ambulatory Visit: Payer: Self-pay

## 2018-09-14 DIAGNOSIS — G35 Multiple sclerosis: Secondary | ICD-10-CM | POA: Diagnosis not present

## 2018-09-15 ENCOUNTER — Other Ambulatory Visit: Payer: Self-pay

## 2018-09-15 ENCOUNTER — Encounter: Payer: Self-pay | Admitting: Family Medicine

## 2018-09-15 ENCOUNTER — Ambulatory Visit: Payer: 59 | Admitting: Family Medicine

## 2018-09-15 VITALS — BP 128/84 | HR 118 | Ht 61.0 in | Wt 184.0 lb

## 2018-09-15 DIAGNOSIS — E878 Other disorders of electrolyte and fluid balance, not elsewhere classified: Secondary | ICD-10-CM | POA: Diagnosis not present

## 2018-09-15 DIAGNOSIS — D649 Anemia, unspecified: Secondary | ICD-10-CM

## 2018-09-15 DIAGNOSIS — M329 Systemic lupus erythematosus, unspecified: Secondary | ICD-10-CM | POA: Diagnosis not present

## 2018-09-15 DIAGNOSIS — G35 Multiple sclerosis: Secondary | ICD-10-CM

## 2018-09-15 DIAGNOSIS — R3589 Other polyuria: Secondary | ICD-10-CM

## 2018-09-15 DIAGNOSIS — R358 Other polyuria: Secondary | ICD-10-CM

## 2018-09-15 DIAGNOSIS — R000 Tachycardia, unspecified: Secondary | ICD-10-CM

## 2018-09-15 DIAGNOSIS — I10 Essential (primary) hypertension: Secondary | ICD-10-CM

## 2018-09-15 DIAGNOSIS — M7989 Other specified soft tissue disorders: Secondary | ICD-10-CM | POA: Diagnosis not present

## 2018-09-15 MED ORDER — METOPROLOL TARTRATE 25 MG PO TABS: 25.0000 mg | ORAL_TABLET | Freq: Two times a day (BID) | ORAL | 0 refills | Status: DC

## 2018-09-15 NOTE — Patient Instructions (Signed)
It was great to see you again today,  I am glad to see that you are doing better.  I I am going to wait until I get the results of your blood work until I decide if we should stop taking the folate, magnesium, and metoprolol.  I will also wait until I get the results before I start a new blood pressure medication.  If you start to experience any symptoms during these episodes of high blood pressure such as headache, blurry vision, abdominal pain, chest pain you should go to an urgent care or emergency department if our clinic is unavailable.  I would like to see you again in 1 month.  I will call you with the results of your lab work and let you know if I prescribed a new medication in the next few days.  Have a great day,  Clemetine Marker, MD

## 2018-09-15 NOTE — Progress Notes (Signed)
[facility] Phone: [phone]     Emily DublinJamie J Hicks - 33 y.o. female MRN [id number]  Date of birth: [birth year]  Subjective:   cc: blood pressure, leg swelling  HPI:  Blood pressure: She states her blood pressure was 136/116 at home.  She says there were multiple readings like this confirmed by her home health nurse.  Patient complains of swelling in her ankles.  She states her ankles feel "really tight".  She tries to keep them elevated and eventually the swelling goes down.  She states this coincided with her high blood pressure.  Patient was started on metoprolol during her last hospital admission for blood pressure control.  Patient wonders if she still needs to take this medication.  Polyuria: Patient states she is going to the bathroom more often than usual.  She wakes up 4-5 times a night to urinate.  She also states she is much thirstier than normal.  She feels like she "cannot quench" her thirst.  Lupus: Patient is taking prednisone, 30 mg daily now.  Next week she will start taking 20.  She says her appetite is improved.  Her mother no longer needs to stay with her, because her ability to walk and her coordination have improved so much.  Patient has had no issues since starting to take Plaquenil.  MS: Patient states her neurologist wants to do an MRI to look further down her spine for lesions so she with her MS.  She states he is intending to talk with her rheumatologist to determine what the best medication would be.  Electrolyte imbalance: Patient wonders if she still needs to take her magnesium and folate, as well as the potassium.  She takes medications every day.  ROS: See HPI for pertinent positives and negatives  Past Medical History  Family history reviewed for today's visit. No changes.  Social history- patient is a non-smoker  Health Maintenance:  -  Health Maintenance Due  Topic Date Due  . INFLUENZA VACCINE  09/04/2018    Objective:    BP 128/84   Pulse (!) 118   Ht 5\' 1"  (1.549 m)   Wt 184 lb (83.5 kg)   SpO2 99%   BMI 34.77 kg/m  Gen: NAD, alert and oriented, cooperative with exam CV: normal rate, regular rhythm. No murmurs, no rubs.  Resp: LCTAB, no wheezes, crackles. normal work of breathing GI: nontender to palpation, BS present, Msk: Muscle strength and ambulation much improved.  No abnormal gait. Neuro: CN II-XII grossly intact. no gross deficits Psych: Appropriate behavior  Assessment/Plan:   SLE (systemic lupus erythematosus related syndrome) (HCC) Ambulation gait much improved.  Patient feels much better overall.  On prednisone taper as prescribed by rheumatologist.  On Plaquenil with no reported side effects. - Medication management per rheumatology. - CMP  MS (multiple sclerosis) (HCC) Neurologist has ordered MRI to further evaluate lesions.  Symptoms have improved with prednisone.  Neurologist will discuss with rheumatology to determine best medication plan. -Follow-up MRI results  Electrolyte imbalance Patient was hypokalemic and hypomagnesemic in the hospital.  Was prescribed supplementation of folate, magnesium, and potassium at discharge. - CMP, mag, folate - Discontinue if improved  Hypertension Patient was prescribed metoprolol in the hospital for hypertension.  Noted multiple elevated diastolic pressures at home.  Today on exam blood pressure was in normal range.  Will need to start patient on a more effective blood pressure medication and phase off of metoprolol if possible, but not starting the  new blood pressure medicine today until final results of her electrolyte levels and creatinine.  Amlodipine may not be best choice given recent leg swelling complaints.  And ARB is most likely best choice for her given her hypokalemia previously. -CMP, mag, - Prescribe antihypertensive after results  Tachycardia Patient still having tachycardia despite improvement in SLE and MS s/p prednisone and  Plaquenil.  Also unusual given that she is currently taking metoprolol twice daily. -CBC to check status of anemia   Polyuria Patient complaining of polyuria and polydipsia.  No current diagnosis of diabetes.  Patient recently started on high-dose prednisone, which may be causing hyperglycemia - CMP will show this glucose levels.  If these are indicative of diabetes will get A1c   Orders Placed This Encounter  Procedures  . Comprehensive metabolic panel    Order Specific Question:   Has the patient fasted?    Answer:   No  . Magnesium  . Folate  . CBC  . Protein / Creatinine Ratio, Urine    Meds ordered this encounter  Medications  . metoprolol tartrate (LOPRESSOR) 25 MG tablet    Sig: Take 1 tablet (25 mg total) by mouth 2 (two) times daily.    Dispense:  60 tablet    Refill:  0     Clemetine Marker, MD PGY-2 [facility]

## 2018-09-16 LAB — COMPREHENSIVE METABOLIC PANEL
ALT: 18 IU/L (ref 0–32)
AST: 11 IU/L (ref 0–40)
Albumin/Globulin Ratio: 1.4 (ref 1.2–2.2)
Albumin: 3.6 g/dL — ABNORMAL LOW (ref 3.8–4.8)
Alkaline Phosphatase: 67 IU/L (ref 39–117)
BUN/Creatinine Ratio: 8 — ABNORMAL LOW (ref 9–23)
BUN: 5 mg/dL — ABNORMAL LOW (ref 6–20)
Bilirubin Total: 0.3 mg/dL (ref 0.0–1.2)
CO2: 21 mmol/L (ref 20–29)
Calcium: 9.4 mg/dL (ref 8.7–10.2)
Chloride: 106 mmol/L (ref 96–106)
Creatinine, Ser: 0.6 mg/dL (ref 0.57–1.00)
GFR calc Af Amer: 139 mL/min/{1.73_m2} (ref >59)
GFR calc non Af Amer: 121 mL/min/{1.73_m2} (ref >59)
Globulin, Total: 2.6 g/dL (ref 1.5–4.5)
Glucose: 86 mg/dL (ref 65–99)
Potassium: 4.2 mmol/L (ref 3.5–5.2)
Sodium: 140 mmol/L (ref 134–144)
Total Protein: 6.2 g/dL (ref 6.0–8.5)

## 2018-09-16 LAB — CBC
Hematocrit: 31.5 % — ABNORMAL LOW (ref 34.0–46.6)
Hemoglobin: 10.1 g/dL — ABNORMAL LOW (ref 11.1–15.9)
MCH: 30.3 pg (ref 26.6–33.0)
MCHC: 32.1 g/dL (ref 31.5–35.7)
MCV: 95 fL (ref 79–97)
Platelets: 242 10*3/uL (ref 150–450)
RBC: 3.33 x10E6/uL — ABNORMAL LOW (ref 3.77–5.28)
RDW: 16.4 % — ABNORMAL HIGH (ref 11.7–15.4)
WBC: 6.3 10*3/uL (ref 3.4–10.8)

## 2018-09-16 LAB — FOLATE: Folate: >20 ng/mL (ref >3.0)

## 2018-09-16 LAB — PROTEIN / CREATININE RATIO, URINE
Creatinine, Urine: 55.4 mg/dL
Protein, Ur: 7.8 mg/dL
Protein/Creat Ratio: 141 mg/g creat (ref 0–200)

## 2018-09-16 LAB — MAGNESIUM: Magnesium: 1.5 mg/dL — ABNORMAL LOW (ref 1.6–2.3)

## 2018-09-19 DIAGNOSIS — I10 Essential (primary) hypertension: Secondary | ICD-10-CM | POA: Insufficient documentation

## 2018-09-19 DIAGNOSIS — R3589 Other polyuria: Secondary | ICD-10-CM | POA: Insufficient documentation

## 2018-09-19 DIAGNOSIS — R358 Other polyuria: Secondary | ICD-10-CM | POA: Insufficient documentation

## 2018-09-19 NOTE — Assessment & Plan Note (Signed)
Ambulation gait much improved.  Patient feels much better overall.  On prednisone taper as prescribed by rheumatologist.  On Plaquenil with no reported side effects. - Medication management per rheumatology. - CMP

## 2018-09-19 NOTE — Assessment & Plan Note (Signed)
Neurologist has ordered MRI to further evaluate lesions.  Symptoms have improved with prednisone.  Neurologist will discuss with rheumatology to determine best medication plan. -Follow-up MRI results

## 2018-09-19 NOTE — Assessment & Plan Note (Signed)
Patient still having tachycardia despite improvement in SLE and MS s/p prednisone and Plaquenil.  Also unusual given that she is currently taking metoprolol twice daily. -CBC to check status of anemia

## 2018-09-19 NOTE — Assessment & Plan Note (Signed)
Patient was prescribed metoprolol in the hospital for hypertension.  Noted multiple elevated diastolic pressures at home.  Today on exam blood pressure was in normal range.  Will need to start patient on a more effective blood pressure medication and phase off of metoprolol if possible, but not starting the new blood pressure medicine today until final results of her electrolyte levels and creatinine.  Amlodipine may not be best choice given recent leg swelling complaints.  And ARB is most likely best choice for her given her hypokalemia previously. -CMP, mag, - Prescribe antihypertensive after results

## 2018-09-19 NOTE — Assessment & Plan Note (Signed)
Patient complaining of polyuria and polydipsia.  No current diagnosis of diabetes.  Patient recently started on high-dose prednisone, which may be causing hyperglycemia - CMP will show this glucose levels.  If these are indicative of diabetes will get A1c

## 2018-09-19 NOTE — Assessment & Plan Note (Signed)
Patient was hypokalemic and hypomagnesemic in the hospital.  Was prescribed supplementation of folate, magnesium, and potassium at discharge. - CMP, mag, folate - Discontinue if improved

## 2018-09-20 ENCOUNTER — Other Ambulatory Visit: Payer: Self-pay | Admitting: Family Medicine

## 2018-09-20 ENCOUNTER — Telehealth: Payer: Self-pay | Admitting: Neurology

## 2018-09-20 MED ORDER — LOSARTAN POTASSIUM 25 MG PO TABS: 25.0000 mg | ORAL_TABLET | Freq: Every day | ORAL | 1 refills | Status: DC

## 2018-09-20 MED ORDER — FOLIC ACID 1 MG PO TABS: 2.0000 mg | ORAL_TABLET | Freq: Every day | ORAL | 0 refills | Status: DC

## 2018-09-20 MED ORDER — MAGNESIUM OXIDE 400 (241.3 MG) MG PO TABS: 400.0000 mg | ORAL_TABLET | Freq: Two times a day (BID) | ORAL | 0 refills | Status: DC

## 2018-09-20 NOTE — Telephone Encounter (Signed)
I spoke with Dr.Aryal.     She had a major lupus flare about 6 weeks ago and was hospitalized.  He would like to place her on CellCept.  She has several lesions consistent with MS, 2 or 3 in the cervical spinal cord and a few in the periventricular white matter.  CellCept may have some benefit for MS though the benefit may not be large.  It is reasonable to place on the best treatment for her lupus and see if the MS remained stable.  I want to check an MRI of the brain and cervical spine in about 6 months.  If she has an exacerbation or breakthrough activity on MRI, then consider ocrelizumab as it may be beneficial for both the MS and lupus.  Alternatively if she is doing very well on the CellCept for her lupus we could consider adding Copaxone.

## 2018-09-20 NOTE — Progress Notes (Signed)
Called patient to discuss her results.  Kidney function was normal.  Protein creatinine ratio normal.  Potassium level is normal.  Magnesium was slightly low.  Folic acid level is normal.  Advised patient to stop potassium given normal level and started her on losartan for elevated blood pressure.  Asked patient to continue magnesium and folic acid.  Provided refills on those.  We will recheck magnesium and other electrolytes after patient is been taking losartan.  Patient has appointment on Friday.  We will recheck blood pressure and labs then.  Patient also asked if she needs to continue taking metoprolol, advised her to continue taking it given that she has tachycardia even with metoprolol and that we will likely need to work-up her cause of tachycardia further in future visits.

## 2018-09-21 ENCOUNTER — Telehealth: Payer: Self-pay | Admitting: *Deleted

## 2018-09-21 NOTE — Telephone Encounter (Signed)
Called pt back. Per Dr. Felecia Shelling, ok to move f/u from 12/2017 to 03/2018. Scheduled appt for 03/14/18 at 3pm with Dr. Felecia Shelling and cx appt in November 2019. Advised her to call if she has any new or worsening sx. She verbalized understanding.

## 2018-09-21 NOTE — Telephone Encounter (Signed)
-----   Message from Britt Bottom, MD sent at 09/20/2018  4:48 PM EDT ----- Please let her know the following: 1.   The MRI of the thoracic spine showed no MS lesions. 2.    I spoke with Dr. Kathlene November (rheumatology) to come up with the plan.  He is going to place her on CellCept which is often used for lupus and may have some benefit in MS.  I would like to see how she does with this medicine.  In 6 months, I would like to check an MRI of the brain and cervical spine.  If she does have an exacerbation or if she has new lesions on the MRI, we will need to consider adding another medication for the MS.  However, if she is stable she might be able to just stay on the lupus medicine.

## 2018-09-21 NOTE — Telephone Encounter (Signed)
Called and spoke with pt about MRI results per Dr. Felecia Shelling note. She is agreeable with plan. She has appt with rheumatology this Thursday. She has f/u with Dr. Felecia Shelling 12/08/18 with Dr. Felecia Shelling but wondering if that appt is too soon since he wants MRI brain/cervical in 6 months. Advised I will ask Dr. Felecia Shelling and call back to let her know.

## 2018-09-24 ENCOUNTER — Other Ambulatory Visit: Payer: Self-pay

## 2018-09-24 ENCOUNTER — Encounter: Payer: Self-pay | Admitting: Family Medicine

## 2018-09-24 ENCOUNTER — Ambulatory Visit: Payer: 59 | Admitting: Family Medicine

## 2018-09-24 VITALS — BP 114/76 | HR 109 | Wt 185.8 lb

## 2018-09-24 DIAGNOSIS — I10 Essential (primary) hypertension: Secondary | ICD-10-CM

## 2018-09-24 DIAGNOSIS — M329 Systemic lupus erythematosus, unspecified: Secondary | ICD-10-CM

## 2018-09-24 DIAGNOSIS — E878 Other disorders of electrolyte and fluid balance, not elsewhere classified: Secondary | ICD-10-CM | POA: Diagnosis not present

## 2018-09-24 DIAGNOSIS — G35 Multiple sclerosis: Secondary | ICD-10-CM

## 2018-09-24 DIAGNOSIS — R000 Tachycardia, unspecified: Secondary | ICD-10-CM | POA: Diagnosis not present

## 2018-09-24 MED ORDER — METOPROLOL SUCCINATE ER 50 MG PO TB24: 50.0000 mg | ORAL_TABLET | Freq: Every day | ORAL | 3 refills | Status: DC

## 2018-09-24 NOTE — Assessment & Plan Note (Signed)
Patient recently started on mycophenolate by her rheumatologist.  Patient has not taken medication yet.  Will start today.  Previous folate was normal, and patient not on methotrexate so we will discontinue her folate. - Continue to monitor while being managed by rheumatologist. -Discontinue folate supplementation

## 2018-09-24 NOTE — Assessment & Plan Note (Signed)
Still with heart rate over 100 on exam.  EKG from last month appears to show sinus tachycardia.  For now we will continue patient on metoprolol, but does not appear further work-up is needed at this moment. - Switched from metoprolol tartrate to succinate.  Toprol XL 50 mg daily.

## 2018-09-24 NOTE — Patient Instructions (Addendum)
It was great to see you again today,  I will let you know the results of your blood work when I get it.  Your blood pressure looks great today.  Please continue taking your losartan.  You do not need to continue taking the folate.  I would like to see you back in 3 months.  Great day,  Clemetine Marker, MD

## 2018-09-24 NOTE — Progress Notes (Signed)
   [facility] Phone: [phone]     Emily Hicks - 33 y.o. female MRN [id number]  Date of birth: [birth year]  Subjective:   cc: Blood pressure, lupus  HPI:  BP: Patient has been taking her losartan.  At home her blood pressure has been reading with the elevated diastolic pressure above 90 "a few times".  Patient's mother was concerned about a previous recall on losartan and if she should be taking it.  Lupus/MS: Patient recently saw her neurologist who is going to reevaluate her with MRI in 6 months before making any medication changes.  Her rheumatologist recently started her on mycophenolate for lupus patient just picked it up today.  Patient does not report any setbacks in her symptoms.  She says she is doing much better.  Patient would like to know if she still needs to take her  Tachycardia: Patient is not complaining of chest pain, dyspnea, palpitations.  Would like to know she still needs to continue her metoprolol.  ROS: See HPI for pertinent positives and negatives  Past Medical History  Family history reviewed for today's visit. No changes.  Social history- patient is a non-smoker  Health Maintenance:  -  Health Maintenance Due  Topic Date Due  . INFLUENZA VACCINE  09/04/2018    Objective:   BP 114/76   Pulse (!) 109   Wt 185 lb 12.8 oz (84.3 kg)   SpO2 99%   BMI 35.11 kg/m  Gen: NAD, alert and oriented, cooperative with exam CV: normal rate, regular rhythm. No murmurs, no rubs.  Resp: LCTAB, no wheezes, crackles. normal work of breathing Psych: Appropriate behavior  Assessment/Plan:   Hypertension Still with a few reported diastolics above 90.  Today it was 114/76.  Assured patient that losartan was safe to take.  We will continue current dose.  BMP to check for kidney function and electrolytes. - BMP and magnesium - Discontinue home magnesium if normal.  Tachycardia Still with heart rate over 100 on exam.  EKG from last month  appears to show sinus tachycardia.  For now we will continue patient on metoprolol, but does not appear further work-up is needed at this moment. - Switched from metoprolol tartrate to succinate.  Toprol XL 50 mg daily.  SLE (systemic lupus erythematosus related syndrome) ([place]) Patient recently started on mycophenolate by her rheumatologist.  Patient has not taken medication yet.  Will start today.  Previous folate was normal, and patient not on methotrexate so we will discontinue her folate. - Continue to monitor while being managed by rheumatologist. -Discontinue folate supplementation   MS (multiple sclerosis) ([place]) Patient recently saw her neurologist and got an MRI of her lumbar spine which did not show any new lesions.  She states he will see her again in 6 months and reevaluate with another MRI before making any medication changes.   Orders Placed This Encounter  Procedures  . Basic Metabolic Panel  . Magnesium    Meds ordered this encounter  Medications  . metoprolol succinate (TOPROL-XL) 50 MG 24 hr tablet    Sig: Take 1 tablet (50 mg total) by mouth at bedtime. Take with or immediately following a meal.    Dispense:  90 tablet    Refill:  3     Clemetine Marker, MD PGY-2 [facility]

## 2018-09-24 NOTE — Assessment & Plan Note (Signed)
Still with a few reported diastolics above 90.  Today it was 114/76.  Assured patient that losartan was safe to take.  We will continue current dose.  BMP to check for kidney function and electrolytes. - BMP and magnesium - Discontinue home magnesium if normal.

## 2018-09-24 NOTE — Assessment & Plan Note (Signed)
Patient recently saw her neurologist and got an MRI of her lumbar spine which did not show any new lesions.  She states he will see her again in 6 months and reevaluate with another MRI before making any medication changes.

## 2018-09-25 LAB — BASIC METABOLIC PANEL
BUN/Creatinine Ratio: 15 (ref 9–23)
BUN: 10 mg/dL (ref 6–20)
CO2: 21 mmol/L (ref 20–29)
Calcium: 9.2 mg/dL (ref 8.7–10.2)
Chloride: 107 mmol/L — ABNORMAL HIGH (ref 96–106)
Creatinine, Ser: 0.65 mg/dL (ref 0.57–1.00)
GFR calc Af Amer: 136 mL/min/{1.73_m2} (ref >59)
GFR calc non Af Amer: 118 mL/min/{1.73_m2} (ref >59)
Glucose: 83 mg/dL (ref 65–99)
Potassium: 3.6 mmol/L (ref 3.5–5.2)
Sodium: 143 mmol/L (ref 134–144)

## 2018-09-25 LAB — MAGNESIUM: Magnesium: 1.8 mg/dL (ref 1.6–2.3)

## 2018-09-28 ENCOUNTER — Telehealth: Payer: Self-pay | Admitting: Family Medicine

## 2018-09-28 NOTE — Telephone Encounter (Signed)
Left voicemail to tell patient she does not need to take magnesium anymore given her recent normal value.  Also informed her that her kidney function was normal.

## 2018-10-06 ENCOUNTER — Ambulatory Visit: Payer: Self-pay | Admitting: Neurology

## 2018-10-18 ENCOUNTER — Other Ambulatory Visit: Payer: Self-pay

## 2018-10-18 ENCOUNTER — Ambulatory Visit (INDEPENDENT_AMBULATORY_CARE_PROVIDER_SITE_OTHER): Payer: 59 | Admitting: *Deleted

## 2018-10-18 DIAGNOSIS — Z3042 Encounter for surveillance of injectable contraceptive: Secondary | ICD-10-CM | POA: Diagnosis not present

## 2018-10-18 MED ORDER — MEDROXYPROGESTERONE ACETATE 150 MG/ML IM SUSY
150.0000 mg | PREFILLED_SYRINGE | Freq: Once | INTRAMUSCULAR | Status: AC
  Administered 2018-10-18: 150 mg via INTRAMUSCULAR

## 2018-10-18 NOTE — Progress Notes (Signed)
Patient here today for Depo Provera injection and is within her dates.    Last contraceptive appt was 07/2018  Depo given in [place] today.  Site unremarkable & patient tolerated injection.    Next injection due 01/03/19 - 01/17/19 .  Reminder card given.    Christen Bame, CMA

## 2018-11-16 ENCOUNTER — Other Ambulatory Visit: Payer: Self-pay

## 2018-11-17 MED ORDER — LOSARTAN POTASSIUM 25 MG PO TABS: 25.0000 mg | ORAL_TABLET | Freq: Every day | ORAL | 1 refills | Status: DC

## 2018-11-24 LAB — HERPES SIMPLEX VIRUS(HSV) DNA BY PCR
HSV 1 DNA: NEGATIVE
HSV 2 DNA: NEGATIVE

## 2018-12-08 ENCOUNTER — Ambulatory Visit: Payer: 59 | Admitting: Neurology

## 2018-12-28 ENCOUNTER — Other Ambulatory Visit: Payer: Self-pay

## 2018-12-28 ENCOUNTER — Ambulatory Visit: Payer: 59 | Admitting: Family Medicine

## 2018-12-28 VITALS — BP 135/80 | HR 106 | Wt 201.6 lb

## 2018-12-28 DIAGNOSIS — S70261A Insect bite (nonvenomous), right hip, initial encounter: Secondary | ICD-10-CM | POA: Diagnosis not present

## 2018-12-28 DIAGNOSIS — W57XXXA Bitten or stung by nonvenomous insect and other nonvenomous arthropods, initial encounter: Secondary | ICD-10-CM | POA: Diagnosis not present

## 2018-12-28 MED ORDER — CEPHALEXIN 500 MG PO CAPS: 500.0000 mg | ORAL_CAPSULE | Freq: Two times a day (BID) | ORAL | 0 refills | Status: DC

## 2018-12-28 NOTE — Assessment & Plan Note (Signed)
Swelling is minimal and is likely related to inflammation rather than infection given lack of erythema or warmth to the skin, but since patient is immunocompromised due to treatment of her lupus, will send prescription for Keflex 500 mg twice daily for 7 days.  Patient was told that she can start taking it today or wait to see if symptoms will resolve spontaneously and use the antibiotic if they have not improved within the next few days if she wishes.  She was given return precautions, including systemic signs of infection.

## 2018-12-28 NOTE — Patient Instructions (Addendum)
It was nice meeting you today Emily Hicks!  I am sending the antibiotic called Keflex to treat a possible skin infection.  You can choose to start this today or wait to see if it will improve on its own and start it if it does not, either way is fine with me.  If you do start the medicine, make sure to take the whole course.  Please take this once in the morning and once in the evening for 7 days.  Please let us know or seek care if you develop a fever, chills, or worsening swelling or pain in that area.  If you have any questions or concerns, please feel free to call the clinic.   Be well,  Dr. Shan Levans

## 2018-12-28 NOTE — Progress Notes (Signed)
   Subjective:    Emily Hicks - 33 y.o. female MRN [id number]  Date of birth: 1985/07/01  CC:  Emily Hicks is here for an insect bite.  HPI: First noticed 1 week ago Located on right buttock Felt pain on awakening and that pain has since worsened and now will intermittently travel down her lateral right leg Worsened this past Sunday and her sister looked at the area and thought it could be a spider bite Denies systemic symptoms, denies rash Has mild itching in the area Has no history of similar lesions Has a past medical history for lupus, for which she is on Plaquenil and CellCept  Health Maintenance:  Health Maintenance Due  Topic Date Due  . INFLUENZA VACCINE  09/04/2018  . PAP SMEAR-Modifier  12/20/2018    -  reports that she has never smoked. She has never used smokeless tobacco. - Review of Systems: Per HPI. - Past Medical History: Patient Active Problem List   Diagnosis Date Noted  . Insect bite of right hip 12/28/2018  . Hypertension 09/19/2018  . Polyuria 09/19/2018  . Gait disturbance 09/08/2018  . Swelling 09/08/2018  . Electrolyte imbalance 08/28/2018  . Protein-calorie malnutrition ([place]) 08/28/2018  . SIRS (systemic inflammatory response syndrome) ([place]) 08/16/2018  . SLE (systemic lupus erythematosus related syndrome) ([place]) 08/16/2018  . Pancytopenia ([place])   . Anxiety and depression   . Pressure injury of skin 08/02/2018  . Elevated liver enzymes 08/02/2018  . Normocytic anemia   . Hypomagnesemia   . Candida esophagitis ([place])   . Hypokalemia   . AKI (acute kidney injury) ([place]) 07/27/2018  . Hypernatremia 07/27/2018  . Tachycardia 07/16/2018  . MS (multiple sclerosis) ([place]) 03/31/2018  . Chronic migraine 08/26/2017  . Chondromalacia, patella, left 06/24/2017  . Healthcare maintenance 12/20/2015  . Contraceptive management 10/11/2014  . Hidradenitis suppurativa of left axilla 06/07/2013  . Numbness 06/07/2012  . Dysplasia of cervix, low grade  (CIN 1) 07/03/2011  . Class 3 severe obesity due to excess calories without serious comorbidity with body mass index (BMI) of 40.0 to 44.9 in adult [facility]) 05/14/2011   - Medications: reviewed and updated   Objective:   Physical Exam BP 135/80   Pulse (!) 106   Wt 201 lb 9.6 oz (91.4 kg)   SpO2 99%   BMI 38.09 kg/m  Gen: NAD, alert, cooperative with exam, well-appearing Skin: Healing lesion with scab present on right buttock with mild swelling surrounding the area.  No erythema visualized.  Mildly tender to palpation.  Does not feel warm. Psych: good insight, alert and oriented        Assessment & Plan:   Insect bite of right hip Swelling is minimal and is likely related to inflammation rather than infection given lack of erythema or warmth to the skin, but since patient is immunocompromised due to treatment of her lupus, will send prescription for Keflex 500 mg twice daily for 7 days.  Patient was told that she can start taking it today or wait to see if symptoms will resolve spontaneously and use the antibiotic if they have not improved within the next few days if she wishes.  She was given return precautions, including systemic signs of infection.    Maia Breslow, M.D. 12/28/2018, 4:43 PM PGY-3, [place]

## 2019-01-17 ENCOUNTER — Ambulatory Visit (INDEPENDENT_AMBULATORY_CARE_PROVIDER_SITE_OTHER): Payer: 59

## 2019-01-17 ENCOUNTER — Other Ambulatory Visit: Payer: Self-pay

## 2019-01-17 DIAGNOSIS — Z3042 Encounter for surveillance of injectable contraceptive: Secondary | ICD-10-CM

## 2019-01-17 MED ORDER — MEDROXYPROGESTERONE ACETATE 150 MG/ML IM SUSP
150.0000 mg | Freq: Once | INTRAMUSCULAR | Status: AC
  Administered 2019-01-17: 11:00:00 150 mg via INTRAMUSCULAR

## 2019-01-17 NOTE — Progress Notes (Signed)
Patient presents in nurse clinic for depo provera injection. Injection given, LUOQ, site unremarkable. Patient due back for next injection, 04/04/2019-04/18/2019, reminder card given.

## 2019-03-15 ENCOUNTER — Ambulatory Visit: Payer: Self-pay | Admitting: Neurology

## 2019-04-15 ENCOUNTER — Other Ambulatory Visit: Payer: Self-pay

## 2019-04-15 ENCOUNTER — Ambulatory Visit (INDEPENDENT_AMBULATORY_CARE_PROVIDER_SITE_OTHER): Payer: 59

## 2019-04-15 DIAGNOSIS — Z3042 Encounter for surveillance of injectable contraceptive: Secondary | ICD-10-CM | POA: Diagnosis not present

## 2019-04-15 MED ORDER — MEDROXYPROGESTERONE ACETATE 150 MG/ML IM SUSP
150.0000 mg | Freq: Once | INTRAMUSCULAR | Status: AC
  Administered 2019-04-15: 10:00:00 150 mg via INTRAMUSCULAR

## 2019-04-15 NOTE — Progress Notes (Signed)
Patient here today for Depo Provera injection and is within her dates.    Last contraceptive appt was 07/2018  Depo given in [place] today.  Site unremarkable & patient tolerated injection.    Next injection due May 28th - June 11th. Advised patient to schedule next depo as office visit.  Reminder card given.    Veronda Prude, RN

## 2019-05-05 ENCOUNTER — Encounter: Payer: Self-pay | Admitting: Neurology

## 2019-05-05 ENCOUNTER — Ambulatory Visit: Payer: 59 | Admitting: Neurology

## 2019-05-05 ENCOUNTER — Other Ambulatory Visit: Payer: Self-pay

## 2019-05-05 VITALS — BP 145/97 | HR 83 | Temp 97.8°F | Ht 61.0 in | Wt 201.0 lb

## 2019-05-05 DIAGNOSIS — M329 Systemic lupus erythematosus, unspecified: Secondary | ICD-10-CM | POA: Diagnosis not present

## 2019-05-05 DIAGNOSIS — R200 Anesthesia of skin: Secondary | ICD-10-CM | POA: Diagnosis not present

## 2019-05-05 DIAGNOSIS — G35 Multiple sclerosis: Secondary | ICD-10-CM

## 2019-05-05 DIAGNOSIS — R269 Unspecified abnormalities of gait and mobility: Secondary | ICD-10-CM

## 2019-05-05 NOTE — Progress Notes (Signed)
[facility]  PATIENT: Emily Hicks DOB: 12-26-1985  REFERRING DOCTOR OR PCP:  Dr. Jeannine Kitten (PCP) SOURCE: patient, lab reports, MRI reports, multiple MRI images personally reviewed.  _________________________________   HISTORICAL  CHIEF COMPLAINT:  Chief Complaint  Patient presents with  . Follow-up    RM . Last seen 09/08/2018.   Marland Kitchen MS/lupus    On cellcept  . Gait Problem    Uses walker    HISTORY OF PRESENT ILLNESS:  She is a 34 year old woman who was diagnosed with MS in early 2020 but has had symptoms since 2010.     Update 05/05/2019: She was placed on Cellcept for SLE and is doing better  Neulologically, she is stable.   She notes left arm tingling.    Strength is fine.   Gait and balance are fine.   Bladder function is fine.   Vision is fine.   She sees ophthalmology regularly.    She has some headaches a few times a week that respond to OTC analgesics.     She sees Dr. Kathlene November [facility]) for her SLE.   I spoke with him after the last visit.  MS Clinical course:   Around 2010, she had numbness/tingling in her left thigh and cramps in her toes.   While pregnant a little time later, she improved.   After her sone was born, the left leg leg symptoms returned.   She saw Dr. Krista Blue and an MRI of the brain showed a few scattered foci but was mostly nonspecific.   She had more intense numbness in the left leg but also numbness in the left face and a repeat 2016 brain MRI showed a few more lesions.    Numbness symptoms did not improve but early 2020 she had more intense numbness and an MRI of the brain and cervical spine was performed.   She had 3-4 lesions in her cervical spine and the brian showed no definite less lesions.   An LP ws then performed and showed > 5 oligoclonal bands.   MS was diagnosed.    She started Aubagio and took only one week before having a hospitalization for fever, hypernatremia, joint inflammation, thrush) and it was  stopped.    First seen by Dr. Felecia Shelling 09/08/2018.    MRI brain 07/27/18: Formal report was Normal - does show a few small T2/Flair lesions, 2 periventricular and the rest deep white matter MRI cervical spine 03/24/18 shows 3-4 lesions (C4 left, C5 left and C7 right also C2 on sagittal only) c/w demyelinating plaque. MRI brain 03/25/18:  Some scattered T2/Flair foci in hemispheres 1-2 perventricular MRI lumbar 08/02/18:  Normal  11/15/2014: By the report:   6 periventricular and subcortical foci - 1 new focus in left posterior temporal white matter and 2 punctate left frontal lobe lesions compared to 2011.   Other lesions stable.  I also see a right cerebral peduncle lesion that may explain her symptoms better 08/08/2009:  Few small foci  MS and other Lab Studies:  CSF showed > 5 OCB and increased IgG index (1.57) 04/16/2018.   Positive ANA with increased dsDNA, ENARNP, ENA SM, SSA.   ESR = 27, Vit D low (15-17)  Other medical problems: She has SLE and is on Plaquenil (Dr. Kathlene November [phone])  She was hospitalized for SLE, hypernatremia, elevated LFTs and thrush 07/27/18,   Of note LFTs were normal 09/08/2018.   She did take Aubagio x 1 week before she was hospitalized.  REVIEW OF SYSTEMS: Constitutional: No fevers, chills, sweats, or change in appetite Eyes: No visual changes, double vision, eye pain Ear, nose and throat: No hearing loss, ear pain, nasal congestion, sore throat Cardiovascular: No chest pain, palpitations Respiratory: No shortness of breath at rest or with exertion.   No wheezes GastrointestinaI: No nausea, vomiting, diarrhea, abdominal pain, fecal incontinence Genitourinary: No dysuria, urinary retention or frequency.  No nocturia. Musculoskeletal: No neck pain, back pain Integumentary: No rash, pruritus, skin lesions Neurological: as above Psychiatric: No depression at this time.  No anxiety Endocrine: No palpitations, diaphoresis, change in appetite, change in weigh or  increased thirst Hematologic/Lymphatic: No anemia, purpura, petechiae. Allergic/Immunologic: No itchy/runny eyes, nasal congestion, recent allergic reactions, rashes  ALLERGIES: No Known Allergies  HOME MEDICATIONS:  Current Outpatient Medications:  .  hydroxychloroquine (PLAQUENIL) 200 MG tablet, Take 400 mg by mouth daily., Disp: , Rfl:  .  medroxyPROGESTERone (DEPO-PROVERA) 150 MG/ML injection, Inject 1 mL (150 mg total) into the muscle every 3 (three) months., Disp: 1 mL, Rfl: 11 .  mycophenolate (CELLCEPT) 500 MG tablet, Take 500 mg by mouth 2 (two) times daily., Disp: , Rfl:  No current facility-administered medications for this visit.  Facility-Administered Medications Ordered in Other Visits:  .  gadopentetate dimeglumine (MAGNEVIST) injection 20 mL, 20 mL, Intravenous, Once PRN, Marcial Pacas, MD  PAST MEDICAL HISTORY: Past Medical History:  Diagnosis Date  . Gallstones   . GERD (gastroesophageal reflux disease)    only prn OTC occasionally  . Headache   . Multiple sclerosis ([place])   . Numbness and tingling of left leg   . Numbness and tingling of right leg     PAST SURGICAL HISTORY: Past Surgical History:  Procedure Laterality Date  . CHOLECYSTECTOMY N/A 12/28/2013   Procedure: LAPAROSCOPIC CHOLECYSTECTOMY;  Surgeon: Fanny Skates, MD;  Location: [facility];  Service: General;  Laterality: N/A;    FAMILY HISTORY: Family History  Problem Relation Age of Onset  . Hypertension Father   . Hypertension Mother   . Hypertension Sister   . Diabetes Maternal Grandmother   . Diabetes Maternal Grandfather     SOCIAL HISTORY:  Social History   Socioeconomic History  . Marital status: Married    Spouse name: Not on file  . Number of children: 1  . Years of education: 40  . Highest education level: Bachelor's degree (e.g., BA, AB, BS)  Occupational History  . Occupation: Accounting  Tobacco Use  . Smoking status: Never Smoker  . Smokeless tobacco: Never Used   Substance and Sexual Activity  . Alcohol use: No  . Drug use: No  . Sexual activity: Not Currently  Other Topics Concern  . Not on file  Social History Narrative   Lives at home with husband and son.   Right-handed.   No caffeine use.   Social Determinants of Health   Financial Resource Strain:   . Difficulty of Paying Living Expenses:   Food Insecurity:   . Worried About Charity fundraiser in the Last Year:   . Arboriculturist in the Last Year:   Transportation Needs:   . Film/video editor (Medical):   Marland Kitchen Lack of Transportation (Non-Medical):   Physical Activity:   . Days of Exercise per Week:   . Minutes of Exercise per Session:   Stress:   . Feeling of Stress :   Social Connections:   . Frequency of Communication with Friends and Family:   . Frequency of Social  Gatherings with Friends and Family:   . Attends Religious Services:   . Active Member of Clubs or Organizations:   . Attends Archivist Meetings:   Marland Kitchen Marital Status:   Intimate Partner Violence:   . Fear of Current or Ex-Partner:   . Emotionally Abused:   Marland Kitchen Physically Abused:   . Sexually Abused:      PHYSICAL EXAM  Vitals:   05/05/19 1109  BP: (!) 145/97  Pulse: 83  Temp: 97.8 F (36.6 C)  Weight: 201 lb (91.2 kg)  Height: '5\' 1"'  (1.549 m)    Body mass index is 37.98 kg/m.   General: The patient is well-developed and well-nourished and in no acute distress   Skin: Extremities are without rash or  edema.   Neurologic Exam  Mental status: The patient is alert and oriented x 3 at the time of the examination. The patient has apparent normal recent and remote memory, with an apparently normal attention span and concentration ability.   Speech is normal.  Cranial nerves: Extraocular movements are full. Pupils are equal, round, and reactive to light and accomodation. Symmetric color vision..  Facial symmetry is present. There is reduced left facial sensation to soft touch .Facial  strength is normal.  Trapezius and sternocleidomastoid strength is normal. No dysarthria is noted.   No obvious hearing deficits are noted.  Motor:  Muscle bulk is normal.   Tone is normal. Strength is  5 / 5 in all 4 extremities  Sensory: Sensory testing is intact to pinprick, soft touch and vibration sensation on the right but reduced with allodynia to touch in the left arm and leg  Coordination: Cerebellar testing reveals good finger-nose-finger and now normal heel-to-shin bilaterally.  Gait and station: Station is normal.   The gait has normal stride.   Tandem is mildly wide.  Romberg is borderline.  Reflexes: Deep tendon reflexes are symmetric and normal bilaterally.         DIAGNOSTIC DATA (LABS, IMAGING, TESTING) - I reviewed patient records, labs, notes, testing and imaging myself where available.  Lab Results  Component Value Date   WBC 6.3 09/15/2018   HGB 10.1 (L) 09/15/2018   HCT 31.5 (L) 09/15/2018   MCV 95 09/15/2018   PLT 242 09/15/2018      Component Value Date/Time   NA 143 09/24/2018 1128   K 3.6 09/24/2018 1128   CL 107 (H) 09/24/2018 1128   CO2 21 09/24/2018 1128   GLUCOSE 83 09/24/2018 1128   GLUCOSE 118 (H) 08/16/2018 1045   BUN 10 09/24/2018 1128   CREATININE 0.65 09/24/2018 1128   CREATININE 0.71 10/29/2010 0934   CALCIUM 9.2 09/24/2018 1128   PROT 6.2 09/15/2018 0938   ALBUMIN 3.6 (L) 09/15/2018 0938   ALBUMIN 4.0 04/16/2018 1225   AST 11 09/15/2018 0938   ALT 18 09/15/2018 0938   ALKPHOS 67 09/15/2018 0938   BILITOT 0.3 09/15/2018 0938   GFRNONAA 118 09/24/2018 1128   GFRAA 136 09/24/2018 1128   Lab Results  Component Value Date   CHOL 79 12/20/2015   HDL 31 (L) 12/20/2015   LDLCALC 34 12/20/2015   TRIG 71 12/20/2015   CHOLHDL 2.5 12/20/2015   Lab Results  Component Value Date   HGBA1C 5.4 06/07/2012   Lab Results  Component Value Date   VELFYBOF75 102 08/09/2018   Lab Results  Component Value Date   TSH 0.901 08/01/2018        ASSESSMENT AND PLAN  1.  SLE (systemic lupus erythematosus related syndrome) ([place])   2. Multiple sclerosis (HCC)   3. Gait disturbance   4. Numbness    1.   She will continue Cellcept 2.  MRI of brain and cervical spine.   If any new lesions will need to initiate a DMT and will d/w Dr. Kathlene November to make sure the combination with cellcept would be ok for her SLE.  3.  Stay active and exercise as tolerated. 4.    She will return to see me in 6 months but sooner based on a treatment decision.   Aundrey Elahi A. Felecia Shelling, MD, [facility] 05/11/5460, 70:35 AM Certified in Neurology, Clinical Neurophysiology, Sleep Medicine and Neuroimaging  [facility] [address], [place], [place] [address] [phone]

## 2019-05-11 ENCOUNTER — Telehealth: Payer: Self-pay | Admitting: Neurology

## 2019-05-11 MED ORDER — ALPRAZOLAM 0.5 MG PO TABS: ORAL_TABLET | ORAL | 0 refills | Status: DC

## 2019-05-11 NOTE — Telephone Encounter (Signed)
no to the covid questions MR Brain w/wo contrast & MR Cervical spine w/wo contrast Dr. Epimenio Foot [facility] Auth: [id number] & [id number] (exp. 05/09/19 to 06/23/19). Patient is scheduled at [facility] for 05/24/19. Patient also informed me she is claustrophic and would need something to help her. She is aware to have a driver.

## 2019-05-11 NOTE — Addendum Note (Signed)
Addended by: Despina Arias A on: 05/11/2019 01:36 PM   Modules accepted: Orders

## 2019-05-11 NOTE — Addendum Note (Signed)
Addended by: Arther Abbott on: 05/11/2019 11:17 AM   Modules accepted: Orders

## 2019-05-11 NOTE — Addendum Note (Signed)
Addended by: Arther Abbott on: 05/11/2019 11:54 AM   Modules accepted: Orders

## 2019-05-24 ENCOUNTER — Ambulatory Visit: Payer: 59

## 2019-05-24 DIAGNOSIS — G35 Multiple sclerosis: Secondary | ICD-10-CM | POA: Diagnosis not present

## 2019-05-24 MED ORDER — GADOBENATE DIMEGLUMINE 529 MG/ML IV SOLN
20.0000 mL | Freq: Once | INTRAVENOUS | Status: AC | PRN
  Administered 2019-05-24: 20 mL via INTRAVENOUS

## 2019-06-15 ENCOUNTER — Ambulatory Visit: Payer: 59 | Attending: Internal Medicine

## 2019-06-15 DIAGNOSIS — Z23 Encounter for immunization: Secondary | ICD-10-CM

## 2019-06-15 NOTE — Progress Notes (Signed)
   Covid-19 Vaccination Clinic  Name:  Emily Hicks    MRN: [id number] DOB: [birth year]  06/15/2019  Emily Hicks was observed post Covid-19 immunization for 15 minutes without incident. She was provided with Vaccine Information Sheet and instruction to access the V-Safe system.   Emily Hicks was instructed to call 911 with any severe reactions post vaccine: Marland Kitchen Difficulty breathing  . Swelling of face and throat  . A fast heartbeat  . A bad rash all over body  . Dizziness and weakness   Immunizations Administered    Name Date Dose VIS Date Route   Pfizer COVID-19 Vaccine 06/15/2019  8:55 AM 0.3 mL 03/30/2018 Intramuscular   Manufacturer: [employer], [employer]   Lot: [id number]   NDC: [address]-5

## 2019-07-05 ENCOUNTER — Other Ambulatory Visit: Payer: Self-pay

## 2019-07-05 ENCOUNTER — Ambulatory Visit (INDEPENDENT_AMBULATORY_CARE_PROVIDER_SITE_OTHER): Payer: 59

## 2019-07-05 DIAGNOSIS — Z3042 Encounter for surveillance of injectable contraceptive: Secondary | ICD-10-CM | POA: Diagnosis not present

## 2019-07-05 MED ORDER — MEDROXYPROGESTERONE ACETATE 150 MG/ML IM SUSP
150.0000 mg | INTRAMUSCULAR | Status: AC
  Administered 2019-07-05 – 2021-03-18 (×5): 150 mg via INTRAMUSCULAR

## 2019-07-05 NOTE — Progress Notes (Signed)
Patient here today for Depo Provera injection and is within her dates.    Last contraceptive appt was 07/2018. Advised patient to schedule office visit   Depo given in LUOQ today.  Site unremarkable & patient tolerated injection.    Next injection due 8/17-8/31.  Reminder card given.    Veronda Prude, RN

## 2019-07-11 ENCOUNTER — Ambulatory Visit: Payer: 59 | Attending: Internal Medicine

## 2019-07-11 DIAGNOSIS — Z23 Encounter for immunization: Secondary | ICD-10-CM

## 2019-07-11 NOTE — Progress Notes (Signed)
° °  Covid-19 Vaccination Clinic  Name:  Emily Hicks    MRN: [id number] DOB: 12-01-1985  07/11/2019  Emily Hicks was observed post Covid-19 immunization for 15 minutes without incident. She was provided with Vaccine Information Sheet and instruction to access the V-Safe system.   Emily Hicks was instructed to call 911 with any severe reactions post vaccine:  Difficulty breathing   Swelling of face and throat   A fast heartbeat   A bad rash all over body   Dizziness and weakness   Immunizations Administered    Name Date Dose VIS Date Route   Pfizer COVID-19 Vaccine 07/11/2019  4:49 PM 0.3 mL 03/30/2018 Intramuscular   Manufacturer: [employer], [employer]   Lot: [id number]   NDC: [address]-8

## 2019-09-28 NOTE — Progress Notes (Signed)
    SUBJECTIVE:   CHIEF COMPLAINT / HPI:   Pap: Patient is due for Pap smear today.  She denies any vaginal itching or burning, vaginal discharge, abdominal pain.  Sexually active with husband in monogamous relationship.  Lupus: Patient sees the rheumatologist every 3 months.  Has another appointment coming up soon.  Patient is still taking her CellCept and Plaquenil.  No changes to medication regimen.  Symptoms of swelling and pain are much improved.   MS: Patient went to the neurologist in April.  There was no any changes to her management and no changes that they saw on repeat MRI.  Patient still feels an occasional "tingling on her left side".  But this is improved since starting medication.  Fatigue: Patient states she is tired during the day even if she gets adequate sleep during night.  She will sometimes sleep during the day.  She denies dyspnea on exertion or shortness of breath.  No heart palpitations.  She thinks it may be because she had gained weight since starting her medication, since she noticed that when she weighed less when she was sick she felt more energetic.  Patient does not snore.   PERTINENT  PMH / PSH: Lupus, MS  OBJECTIVE:   BP 128/84   Pulse 88   Ht 5\' 1"  (1.549 m)   Wt 216 lb 6.4 oz (98.2 kg)   SpO2 99%   BMI 40.89 kg/m   General: Alert and oriented.  No acute distress. HEENT: PERRLA, EOMI. CV: Regular rate and rhythm, no murmurs. Pulmonary: Lungs clear to auscultation bilaterally, no wheezes or crackles. MSK: 5/5 strength bilaterally in upper and lower extremities. GU: Normal appearing vagina and cervix.  No bleeding or discharge.  No lesions.  ASSESSMENT/PLAN:   Fatigue Patient with reported increased fatigue recently.  Also complained of fatigue in 2018.  Fatigue seem to improve when she lost weight secondary to her illness.  Patient now weighs 216 pounds, got as low as 160 and 2020.  Patient has a variety of possible explanations for fatigue  including her diagnosis of lupus and MS as well as the medication she takes for this, and recent weight gain.  Does not appear to have signs or symptoms of OSA.  Physical exam and history not concerning for heart failure.  We will get TSH to check for hypothyroidism.  Gave patient information for our nutritionist, Dr. 2019.  SLE (systemic lupus erythematosus related syndrome) (HCC) Stable.  Managed by rheumatology.  On cellcept and plaquenil. Will get cbc to check status of her anemia, which was likely caused by her uncontrolled lupus.     MS (multiple sclerosis) (HCC) Managed by neurology.  Currently on cellcept and plaquenil.  Recent MRI showed no change from previous MRI.  Still has some occaisional paresthesia on left side but it is much improved.      Wyona Almas, MD [facility]

## 2019-09-29 ENCOUNTER — Encounter: Payer: Self-pay | Admitting: Family Medicine

## 2019-09-29 ENCOUNTER — Ambulatory Visit: Payer: 59 | Admitting: Family Medicine

## 2019-09-29 ENCOUNTER — Other Ambulatory Visit (HOSPITAL_COMMUNITY)
Admission: RE | Admit: 2019-09-29 | Discharge: 2019-09-29 | Disposition: A | Discharge status: Home / Self Care | Payer: 59 | Source: Ambulatory Visit | Attending: Family Medicine | Admitting: Family Medicine

## 2019-09-29 ENCOUNTER — Other Ambulatory Visit: Payer: Self-pay

## 2019-09-29 VITALS — BP 128/84 | HR 88 | Ht 61.0 in | Wt 216.4 lb

## 2019-09-29 DIAGNOSIS — Z124 Encounter for screening for malignant neoplasm of cervix: Secondary | ICD-10-CM

## 2019-09-29 DIAGNOSIS — R5383 Other fatigue: Secondary | ICD-10-CM

## 2019-09-29 DIAGNOSIS — M329 Systemic lupus erythematosus, unspecified: Secondary | ICD-10-CM | POA: Diagnosis not present

## 2019-09-29 DIAGNOSIS — E669 Obesity, unspecified: Secondary | ICD-10-CM

## 2019-09-29 DIAGNOSIS — Z7251 High risk heterosexual behavior: Secondary | ICD-10-CM | POA: Insufficient documentation

## 2019-09-29 DIAGNOSIS — Z3042 Encounter for surveillance of injectable contraceptive: Secondary | ICD-10-CM

## 2019-09-29 DIAGNOSIS — G35 Multiple sclerosis: Secondary | ICD-10-CM

## 2019-09-29 LAB — POCT WET PREP (WET MOUNT)
Clue Cells Wet Prep Whiff POC: NEGATIVE
Trichomonas Wet Prep HPF POC: ABSENT

## 2019-09-29 NOTE — Patient Instructions (Signed)
It was nice to see you again,  I will call you with the results of your lab tests.  I have attached our nutritionist, Wyona Almas, card.  You can call or email her to schedule an appointment.  She should be back from her vacation in 2 weeks.  I do not need to see you again for least 6 months unless something changes with your blood test.  Have a great day,  Frederic Jericho, MD

## 2019-09-30 DIAGNOSIS — Z124 Encounter for screening for malignant neoplasm of cervix: Secondary | ICD-10-CM | POA: Insufficient documentation

## 2019-09-30 LAB — CBC
Hematocrit: 36.5 % (ref 34.0–46.6)
Hemoglobin: 12.5 g/dL (ref 11.1–15.9)
MCH: 28.7 pg (ref 26.6–33.0)
MCHC: 34.2 g/dL (ref 31.5–35.7)
MCV: 84 fL (ref 79–97)
Platelets: 260 10*3/uL (ref 150–450)
RBC: 4.35 x10E6/uL (ref 3.77–5.28)
RDW: 12.4 % (ref 11.7–15.4)
WBC: 4.8 10*3/uL (ref 3.4–10.8)

## 2019-09-30 LAB — COMPREHENSIVE METABOLIC PANEL
ALT: 10 IU/L (ref 0–32)
AST: 11 IU/L (ref 0–40)
Albumin/Globulin Ratio: 1.7 (ref 1.2–2.2)
Albumin: 4.2 g/dL (ref 3.8–4.8)
Alkaline Phosphatase: 89 IU/L (ref 48–121)
BUN/Creatinine Ratio: 10 (ref 9–23)
BUN: 7 mg/dL (ref 6–20)
Bilirubin Total: 0.6 mg/dL (ref 0.0–1.2)
CO2: 22 mmol/L (ref 20–29)
Calcium: 9.6 mg/dL (ref 8.7–10.2)
Chloride: 107 mmol/L — ABNORMAL HIGH (ref 96–106)
Creatinine, Ser: 0.72 mg/dL (ref 0.57–1.00)
GFR calc Af Amer: 127 mL/min/{1.73_m2} (ref >59)
GFR calc non Af Amer: 110 mL/min/{1.73_m2} (ref >59)
Globulin, Total: 2.5 g/dL (ref 1.5–4.5)
Glucose: 88 mg/dL (ref 65–99)
Potassium: 4 mmol/L (ref 3.5–5.2)
Sodium: 143 mmol/L (ref 134–144)
Total Protein: 6.7 g/dL (ref 6.0–8.5)

## 2019-09-30 LAB — TSH: TSH: 0.401 u[IU]/mL — ABNORMAL LOW (ref 0.450–4.500)

## 2019-09-30 LAB — CERVICOVAGINAL ANCILLARY ONLY
Chlamydia: NEGATIVE
Comment: NEGATIVE
Comment: NORMAL
Neisseria Gonorrhea: NEGATIVE

## 2019-09-30 NOTE — Assessment & Plan Note (Addendum)
Patient with reported increased fatigue recently.  Also complained of fatigue in 2018.  Fatigue seem to improve when she lost weight secondary to her illness.  Patient now weighs 216 pounds, got as low as 160 and 2020.  Patient has a variety of possible explanations for fatigue including her diagnosis of lupus and MS as well as the medication she takes for this, and recent weight gain.  Does not appear to have signs or symptoms of OSA.  Physical exam and history not concerning for heart failure.  We will get TSH to check for hypothyroidism.  Gave patient information for our nutritionist, Dr. Wyona Almas.

## 2019-10-04 LAB — CYTOLOGY - PAP
Comment: NEGATIVE
Diagnosis: HIGH — AB
High risk HPV: POSITIVE — AB

## 2019-10-05 NOTE — Assessment & Plan Note (Signed)
Stable.  Managed by rheumatology.  On cellcept and plaquenil. Will get cbc to check status of her anemia, which was likely caused by her uncontrolled lupus.

## 2019-10-05 NOTE — Assessment & Plan Note (Signed)
Managed by neurology.  Currently on cellcept and plaquenil.  Recent MRI showed no change from previous MRI.  Still has some occaisional paresthesia on left side but it is much improved.

## 2019-11-03 ENCOUNTER — Ambulatory Visit (INDEPENDENT_AMBULATORY_CARE_PROVIDER_SITE_OTHER): Payer: 59 | Admitting: Family Medicine

## 2019-11-03 ENCOUNTER — Other Ambulatory Visit: Payer: Self-pay

## 2019-11-03 VITALS — BP 124/84 | HR 84 | Ht 61.0 in | Wt 215.4 lb

## 2019-11-03 DIAGNOSIS — R87613 High grade squamous intraepithelial lesion on cytologic smear of cervix (HGSIL): Secondary | ICD-10-CM | POA: Diagnosis not present

## 2019-11-03 NOTE — Patient Instructions (Signed)
Thank you for coming to see me today. It was a pleasure. Today we talked about:   I have placed a referral to Gynecology for treatment of your abnormal pap smear.  If you do not hear from them in the next week, please give [place] a call.  If you have any questions or concerns, please do not hesitate to call the office at [phone].  Best,   Luis Abed, DO

## 2019-11-03 NOTE — Progress Notes (Signed)
    SUBJECTIVE:   CHIEF COMPLAINT / HPI:   HSIL Pap Smear Patient presents today for gynecology clinic follow-up for abnormal Pap smear performed on 8/26+ high-risk HPV and HSIL Last Pap was 12/20/2015 and was NILM   PERTINENT  PMH / PSH: Hypertension, multiple sclerosis, HS, lupus  OBJECTIVE:   BP 124/84   Pulse 84   Ht 5\' 1"  (1.549 m)   Wt 215 lb 6.4 oz (97.7 kg)   BMI 40.70 kg/m   Physical Exam:  General: 34 y.o. female in NAD Lungs: Breathing comfortably on room air Skin: warm and dry Psych: Mood and affect appropriate for circumstance, appropriate dress   ASSESSMENT/PLAN:   HSIL (high grade squamous intraepithelial lesion) on Pap smear of cervix Per ASC CP guidelines, patient with H SIL and positive HPV should have colposcopy/treatment.  Discussed with patient that colposcopy is performed in gynecology clinic at [facility] for low risk patients, given HSIL, would refer to gynecology for treatment for possible LEEP versus cryotherapy.  Discussed with patient at length meaning of results and importance of regular Pap smears.  She will follow-up with gynecology, referral placed.     [facility], DO [facility]

## 2019-11-03 NOTE — Assessment & Plan Note (Signed)
Per ASC CP guidelines, patient with H SIL and positive HPV should have colposcopy/treatment.  Discussed with patient that colposcopy is performed in gynecology clinic at [facility] for low risk patients, given HSIL, would refer to gynecology for treatment for possible LEEP versus cryotherapy.  Discussed with patient at length meaning of results and importance of regular Pap smears.  She will follow-up with gynecology, referral placed.

## 2019-11-07 ENCOUNTER — Ambulatory Visit: Payer: 59 | Admitting: Neurology

## 2019-12-05 ENCOUNTER — Ambulatory Visit: Payer: 59 | Admitting: Obstetrics and Gynecology

## 2019-12-05 ENCOUNTER — Other Ambulatory Visit: Payer: Self-pay

## 2019-12-05 ENCOUNTER — Encounter: Payer: Self-pay | Admitting: Obstetrics and Gynecology

## 2019-12-05 VITALS — BP 146/84 | Ht 62.0 in | Wt 216.0 lb

## 2019-12-05 DIAGNOSIS — D069 Carcinoma in situ of cervix, unspecified: Secondary | ICD-10-CM | POA: Diagnosis not present

## 2019-12-05 DIAGNOSIS — R87613 High grade squamous intraepithelial lesion on cytologic smear of cervix (HGSIL): Secondary | ICD-10-CM

## 2019-12-05 NOTE — Progress Notes (Signed)
Emily Hicks March 30, 1985 [id number]  SUBJECTIVE:  34 y.o. G1P0001 female presents as a referral from her primary care doctor for an abnormal Pap smear.  Her routine Pap smear 09/29/2019 was HGSIL/high-grade squamous intraepithelial lesion.  High risk HPV probe was positive.  Of note, she does have autoimmune conditions and is maintained on immunosuppressive medications.  She indicates a history of possible abnormal Pap smear in the past but no cone biopsy or LEEP.  2013 Pap smear was LGSIL cytology with colposcopy biopsy indicating CIN-1, 2014 and 2017 Pap smears with normal cytology.  She indicates she is up-to-date on her Depo-Provera injection for contraception.   Current Outpatient Medications  Medication Sig Dispense Refill  . hydroxychloroquine (PLAQUENIL) 200 MG tablet Take 400 mg by mouth daily.    . mycophenolate (CELLCEPT) 500 MG tablet Take 500 mg by mouth 2 (two) times daily.    . medroxyPROGESTERone (DEPO-PROVERA) 150 MG/ML injection Inject 1 mL (150 mg total) into the muscle every 3 (three) months. 1 mL 11   Current Facility-Administered Medications  Medication Dose Route Frequency Provider Last Rate Last Admin  . medroxyPROGESTERone (DEPO-PROVERA) injection 150 mg  150 mg Intramuscular Q90 days McDiarmid, Leighton Roach, MD   150 mg at 09/29/19 1004   Facility-Administered Medications Ordered in Other Visits  Medication Dose Route Frequency Provider Last Rate Last Admin  . gadopentetate dimeglumine (MAGNEVIST) injection 20 mL  20 mL Intravenous Once PRN Levert Feinstein, MD       Allergies: Patient has no known allergies.  No LMP recorded. Patient has had an injection.  Past medical history,surgical history, problem list, medications, allergies, family history and social history were all reviewed and documented as reviewed in the EPIC chart.   OBJECTIVE:  BP (!) 146/84   Ht 5\' 2"  (1.575 m)   Wt 216 lb (98 kg)   BMI 39.51 kg/m  The patient appears well, alert, oriented, in no  distress. PELVIC EXAM: VULVA: normal appearing vulva with no masses, tenderness or lesions, VAGINA: normal appearing vagina with normal color and discharge, no lesions  Physical Exam Genitourinary:        Procedure Colposcopy The cervix was viewed with a Graves speculum.  Dilute acetic acid was applied to the cervix.  Immediately apparent to cross the posterior half of the cervix was a dense acetowhite plaque that was diffuse and covered nearly the entire posterior cervix, with a 1 cm island of plaque located just off the main plaque at 7:00.  Tischler biopsy forceps were used to sample at 4:00 at the A ring and also at 7:00 at the plaque [place].  Endocervical curettings were collected with a Kevorkian curette and Cytobrush.  The entire transformation zone was not able to be visualized so today's exam is inadequate.  Usual impression consistent with severe cervical dysplasia or possibly worse.  Hemostasis obtained with application of pressure and silver nitrate cautery.  Chaperone: Cuthbert present during the examination and procedure  ASSESSMENT:  34 y.o. G1P0001 here for colposcopy due to HGSIL Pap smear  PLAN:  We discussed that today's findings are likely consistent with more advanced stage cervical dysplasia.  We discussed the role of HPV in mediating dysplastic changes to the cervical tissue.  This process is probably exacerbated by her being on medications with immunosuppressive qualities deemed necessary for treatment of her autoimmune conditions.  We will await the pathology results and if severe dysplasia is confirmed then I would recommend proceeding with cone biopsy in the operating  room under anesthesia rather than doing a LEEP in the office as to sufficiently expose the anatomy and access the large swath of tissue that appears abnormal.   Theresia Majors MD 12/05/19

## 2019-12-07 LAB — TISSUE PATH REPORT 10802

## 2019-12-07 LAB — PATHOLOGY REPORT

## 2019-12-07 NOTE — Progress Notes (Signed)
I would recommend the patient make an appointment with me (virtual or in-person) to discuss the pathology results, basically there is severe stage precancerous disease and potentially even what we call cervical cancer 'in situ' (meaning that the abnormal cells have NOT yet grown deeper into the cervix) at least from what we can tell from the areas that were biopsied. Next step would be to at least perform a cervical cone biopsy but I wanted to discuss the options with her in person (or via video) at an appointment.

## 2019-12-08 ENCOUNTER — Ambulatory Visit: Payer: 59 | Admitting: Obstetrics and Gynecology

## 2019-12-08 ENCOUNTER — Encounter: Payer: Self-pay | Admitting: Obstetrics and Gynecology

## 2019-12-08 ENCOUNTER — Telehealth: Payer: Self-pay

## 2019-12-08 ENCOUNTER — Other Ambulatory Visit: Payer: Self-pay

## 2019-12-08 VITALS — BP 144/90

## 2019-12-08 DIAGNOSIS — D069 Carcinoma in situ of cervix, unspecified: Secondary | ICD-10-CM

## 2019-12-08 NOTE — Telephone Encounter (Signed)
I called patient and let her know that Dr. Penni Bombard wants [place] to obtain medical clearance from her PCP and Rheumatologist prior to surgery.  I advised her I will fax medical clearance form to both and inquire what we will need to do to obtain clearance. I told her oftentimes a visit is required to clear patient for surgery.  Clearance request form faxed to both MD's and confirmation received.  I told patient when I hear from these offices I will be in touch with her.

## 2019-12-08 NOTE — Patient Instructions (Signed)
Cervical Conization Cervical conization (cone biopsy) is a procedure in which a cone-shaped portion of the cervix is cut out so that it can be looked at under a microscope. The cervix is the lowest part of the uterus. This procedure is done to check for cancer cells or cells that might turn into cancer (precancerous cells). You may have this procedure if:  You have abnormal bleeding from your cervix.  You had an abnormal Pap test.  Something abnormal was seen on your cervix during an exam. This procedure is performed in either a health care provider's office or in an operating room. Tell a health care provider about:  Any allergies you have.  All medicines you are taking, including vitamins, herbs, eye drops, creams, and over-the-counter medicines.  Any problems you or family members have had with anesthetic medicines.  Any blood disorders you have.  Any surgeries you have had.  Any medical conditions you have.  Your use of nicotine or tobacco products.  When you normally have your menstrual period.  Whether you are pregnant or may be pregnant. What are the risks? Generally, this is a safe procedure. However, problems may occur, including:  Heavy bleeding for several days or weeks after the procedure.  Allergic reactions to medicines or to dyes or other solutions.  Increased risk of early (preterm) labor in future pregnancies.  Infection. This is rare.  Damage to the cervix or nearby structures or organs. This is rare. What happens before the procedure? Staying hydrated Follow instructions from your health care provider about hydration, which may include:  Up to 2 hours before the procedure - you may continue to drink clear liquids, such as water, clear fruit juice, black coffee, and plain tea. Eating and drinking restrictions Follow instructions from your health care provider about eating and drinking, which may include:  8 hours before the procedure - stop eating  heavy meals or foods, such as meat, fried foods, or fatty foods.  6 hours before the procedure - stop eating light meals or foods, such as toast or cereal.  6 hours before the procedure - stop drinking milk or drinks that contain milk.  2 hours before the procedure - stop drinking clear liquids. Medicines Ask your health care provider about:  Changing or stopping your regular medicines. This is especially important if you are taking diabetes medicines or blood thinners.  Taking medicines such as aspirin and ibuprofen. These medicines can thin your blood. Do not take these medicines unless your health care provider tells you to take them.  Taking over-the-counter medicines, vitamins, herbs, and supplements. General instructions  If told by your health care provider, do not put anything in the vagina before the procedure. Do not douche, have sex, use tampons, or use any vaginal medicines before the procedure.  Ask your health care provider: ? How your surgery site will be marked. ? What steps will be taken to help prevent infection. These may include:  Removing hair at the surgery site.  Washing skin with a germ-killing soap.  Taking antibiotic medicine.  Plan to have someone take you home from the hospital or clinic.  If you will be going home right after the procedure, plan to have someone with you for 24 hours.  You may be asked to empty your bladder and bowel right before the procedure. What happens during the procedure?      You will lie on an examining table and put your feet in footrests (stirrups).  An IV  will be inserted into one of your veins.  You will be given one or more of the following: ? A medicine to help you relax (sedative). ? A medicine to numb the area (local anesthetic). ? A medicine to make you fall asleep (general anesthetic). ? A medicine that numbs the cervix (cervical block).  A lubricated device called a speculum will be inserted into your  vagina. It will be used to spread open the walls of the vagina so your health care provider can better see the inside of the vagina and cervix.  An instrument that has a magnifying lens and a light (colposcope) will let your health care provider examine the cervix more closely.  Your health care provider will apply a solution to your cervix. This turns abnormal areas a pale color.  A tissue sample will be removed from the cervix using one of the following methods: ? The cold knife method. The tissue is cut out with a knife (scalpel). ? The loop electrosurgical excision procedure (LEEP) method. The tissue is cut out with a thin wire that can burn (cauterize) the tissue with an electrical current. ? Laser treatment method. The tissue is cut out and then cauterized with a laser beam to prevent bleeding.  Your health care provider will apply a paste over the biopsy areas to help control bleeding.  The tissue sample will be checked under a microscope. The procedure may vary among health care providers and hospitals. What happens after the procedure?  Your blood pressure, heart rate, breathing rate, and blood oxygen level will be monitored until you leave the hospital or clinic.  If you were given a local anesthetic, you will rest at the clinic or hospital until you are stable and ready to go home.  If you were given a general anesthetic, you may be monitored for a longer period of time.  You may have some cramping.  You may have bloody discharge or light to moderate bleeding.  You may have dark discharge coming from your vagina. This is from the paste used on the cervix to prevent bleeding. Summary  Cervical conization is a procedure in which a cone-shaped portion of the cervix is cut out so that it can be examined under a microscope.  This procedure is done to check for cancer cells or cells that might turn into cancer (precancerous cells).  After the procedure, you may have bloody  discharge or light to moderate bleeding. You may also have dark discharge from the paste that was used on the cervix to prevent bleeding. This information is not intended to replace advice given to you by your health care provider. Make sure you discuss any questions you have with your health care provider. Document Revised: 07/19/2018 Document Reviewed: 07/19/2018 Elsevier Patient Education  2020 [employer]  Cervical Dysplasia  Cervical dysplasia is a condition in which a woman's cervix cells have abnormal changes. The cervix is the opening of the uterus (womb). It is located between the vagina and the uterus. Cervical dysplasia may be an early sign of cervical cancer. If left untreated, this condition may become more severe and may progress to cervical cancer. Early detection, treatment, and follow-up care are very important. What are the causes? Cervical dysplasia can be caused by a human papillomavirus (HPV) infection. HPV is the most common sexually transmitted infection (STI). HPV is spread from person to person through sexual contact. This includes oral, vaginal, or anal sex. What increases the risk? The following factors may  make you more likely to develop this condition:  Having had a sexually transmitted infection (STI), such as herpes, chlamydia or HPV.  Becoming sexually active before age 36.  Having had more than one sexual partner.  Having a sexual partner who has multiple sexual partners.  Not using protection, such as a condom, during sex, especially with new sexual partners.  Having a history of cancer of the vagina or vulva.  Having a weakened body defense (immune) system.  Being the daughter of a woman who took diethylstilbestrol (DES) during pregnancy.  Having a family history of cervical cancer.  Smoking.  Using oral contraceptives, also called birth control pills.  Having had three or more full-term pregnancies. What are the signs or symptoms? There are  usually no symptoms of this condition. If you do have symptoms, they may include:  Abnormal vaginal discharge.  Bleeding between periods or after sex.  Bleeding during menopause.  Pain during sex (dyspareunia). How is this diagnosed? A test called a Pap test may be done. During this test, cells are taken from the cervix and then examined under a microscope. A test in which tissue is removed from the cervix (biopsy) may also be done if the Pap test is abnormal or if the cervix looks abnormal. How is this treated? Treatment varies based on the severity of the condition. Treatment may include:  Cryotherapy. During cryotherapy, the abnormal cells are frozen with a steel-tip instrument.  Loop electrosurgical excision procedure (LEEP). This procedure removes abnormal tissue from the cervix.  Surgery to remove abnormal tissue. This is usually done in more advanced cases. Surgical options include: ? A cone biopsy. This is a procedure in which the cervical canal and a portion of the center of the cervix are removed. ? Hysterectomy. This is a surgery in which the uterus and cervix are removed. Follow these instructions at home:  Take over-the-counter and prescription medicines only as told by your health care provider.  Do not use tampons, have sex, or douche until your health care provider says it is okay.  Keep follow-up visits as told by your health care provider. This is important. Women who have been treated for cervical dysplasia should have regular pelvic exams and Pap tests as told by their health care provider. How is this prevented?  Practice safe sex to help prevent sexually transmitted infections (STI) that may cause this condition.  Have regular Pap tests. Talk with your health care provider about how often you need these tests. Pap tests will help identify cell changes that can lead to cancer. Contact a health care provider if:  You develop genital warts. Get help right away  if:  You have a fever.  You have abnormal vaginal discharge.  Your menstrual period is heavier than normal.  You develop bright red bleeding. This may include blood clots.  You have pain or cramps that get worse, and medicine does not help to relieve your pain.  You feel light-headed and you are unusually weak.  You have fainting spells.  You have pain in the abdomen. Summary  Cervical dysplasia is a condition in which a woman's cervix cells have abnormal changes.  If left untreated, this condition may become more severe and may progress to cervical cancer.  Early detection, treatment, and follow-up care are very important in managing this condition.  Have regular pelvic exams and Pap tests. Talk with your health care provider about how often you need these tests. Pap tests will help identify cell  changes that can lead to cancer. This information is not intended to replace advice given to you by your health care provider. Make sure you discuss any questions you have with your health care provider. Document Revised: 11/11/2017 Document Reviewed: 01/24/2016 Elsevier Patient Education  2020 [employer].

## 2019-12-08 NOTE — Progress Notes (Signed)
   Emily Hicks Dec 14, 1985 [id number]  SUBJECTIVE:  34 y.o. G1P1001 female presents for consultation regarding her recent cervical biopsy results. The biopsy at 7:00 at the '[facility]' indicated CIN 3 bordering on carcinoma in situ.  ECC indicated CIN 2-3.  The other cervical biopsy at 4:00 showed CIN 2.  Past medical history,surgical history, problem list, medications, allergies, family history and social history were all reviewed and documented as reviewed in the EPIC chart.   OBJECTIVE:  BP (!) 144/90  The patient appears well, alert, oriented x 3, in no distress. PELVIC EXAM: deferred   ASSESSMENT:  34 y.o. G1P1001 here for preoperative consultation  PLAN:  Using diagrams we discussed the most concerning finding of CIN 3 and possibly CIS, discussing that this indicates a level of abnormality of the cervical cells that suggest carcinoma features but those cells have not yet invaded through the basement membrane (at least in the small biopsy sample that was obtained).  We discussed that it is possible she could have some invasive cervical cancer but we would need more tissue to determine whether or not this is the case.  This would be obtained by proceeding with cold knife conization.  Severe cervical dysplasia and CIS have a high risk of progression to invasive cervical cancer if left untreated.  Next steps in management would depend on the pathology results from the cervical cone biopsy.  If she is found to have just cervical dysplasia then close interval Pap smear follow-up would be all that would be needed.  If CIS or early stage microinvasive cervical cancer is confirmed then a simple hysterectomy would be treatment (as she indicates that she is not planning to have anymore children).  Any more advanced stage cervical cancer would require referral to gynecologic oncology to discuss the next steps in treatment.    With this surgery I discussed risks of infection bleeding possibly requiring  blood transfusion risk of injury to surrounding organ structures including bladder. In the very rare event of severe uncontrollable bleeding the hysterectomy could be needed emergently but this is not likely.  Risk of anesthesia includes heart attack, stroke, death.  Any surgical procedure has a risk DVT and/or other thromboembolic disease.  I discussed the concept of desiring to obtain clear margins around the severe dysplasia/CIS and that this is not always possible to achieve an may result in residual lesion left behind outside of the excised margin.  This require close follow-up Pap smears and/or possible need for repeat colposcopy and/or excision.  Future pregnancies would have an increased risk of preterm labor and/or birth.  Cervical stenosis is also a potential issue.  We discussed the need for postoperative recovery to include pelvic rest for least 3-4 weeks, avoidance of heavy lifting in the first month after the procedure, and possible need to be off of work for at least a few days after the procedure. She may return to normal function in capabilities including driving which is comfortable do so and off of any narcotic pain medication.  Patient states that she has full understanding of the above-mentioned concepts and would like to proceed with [facility].  I will have staff contact the patient to arrange the procedure and also preoperative medical clearance.  All questions are answered at the end today's visit.   Theresia Majors MD 12/08/19

## 2019-12-12 ENCOUNTER — Telehealth: Payer: Self-pay | Admitting: *Deleted

## 2019-12-12 NOTE — Telephone Encounter (Addendum)
Faxed signed surgical clearance back to [facility] clearing pt neurologically for cervical cone biopsy, endocervical for CIN 3, carcinoma in stitu per Dr. Epimenio Foot. Fax: [phone]. Received fax confirmation.

## 2019-12-15 ENCOUNTER — Telehealth: Payer: Self-pay

## 2019-12-15 NOTE — Telephone Encounter (Signed)
Lynden Ang, from [facility], calls nurse like checking status of surgical clearance form. Have you received this? If not, I will have her send another one.

## 2019-12-17 NOTE — Telephone Encounter (Signed)
I completed this and left it up front to be faxed a few days ago.  They should have received it by now.

## 2019-12-19 NOTE — Telephone Encounter (Signed)
Received returned phone call from [place]. They never received surgical clearance. Re-faxed to [phone], attention Olegario Messier. Placed a copy in batch scanning.   Veronda Prude, RN

## 2019-12-19 NOTE — Telephone Encounter (Signed)
I LVM with Lynden Ang letting her know the form was completed and faxed a few days ago. I advised Lynden Ang to call me back if the surgical clearance was never received.

## 2019-12-22 ENCOUNTER — Encounter: Payer: Self-pay | Admitting: Gynecology

## 2020-01-09 ENCOUNTER — Telehealth: Payer: Self-pay

## 2020-01-09 ENCOUNTER — Ambulatory Visit (INDEPENDENT_AMBULATORY_CARE_PROVIDER_SITE_OTHER): Payer: 59

## 2020-01-09 ENCOUNTER — Encounter (HOSPITAL_BASED_OUTPATIENT_CLINIC_OR_DEPARTMENT_OTHER): Payer: Self-pay | Admitting: Obstetrics and Gynecology

## 2020-01-09 ENCOUNTER — Other Ambulatory Visit: Payer: Self-pay

## 2020-01-09 DIAGNOSIS — Z3042 Encounter for surveillance of injectable contraceptive: Secondary | ICD-10-CM

## 2020-01-09 LAB — POCT URINE PREGNANCY: Preg Test, Ur: NEGATIVE

## 2020-01-09 NOTE — Progress Notes (Signed)
Patient here today for Depo Provera injection and is within her dates.    Last contraceptive appt was 09/29/19  Depo given in RUOQ today.  Site unremarkable & patient tolerated injection.    Next injection due Feb 21-March 7th.  Reminder card given.    Veronda Prude, RN

## 2020-01-09 NOTE — Telephone Encounter (Signed)
Patient returned my call. I informed her that Dr. Deedra Ehrich cleared her for surgery but with directions regarding her medication.  "Hold CellCept for 3 days prior to surgery. If surgery goes well, can restart CellCept 10-14 days after surgery."  I advised patient of this and she wrote it and verbalized understanding.

## 2020-01-09 NOTE — Progress Notes (Addendum)
Spoke w/ via phone for pre-op interview---pt Lab needs dos----cbc, t & s, I stat 8,  urine preg               Lab results------lov dr sater [facility]y 05-05-2019 epic, clearance dr Deanne Coffer 12-20-2019 on chart for 01-16-2020 surgery, note from dr aryal states to hold cellcept 3 days prior to 01-16-2020 surgery and restart cellcelpt 10-14 days after surgery (patient aware) COVID test ------01-12-2020 910 am Arrive at -------530 am 01-16-2020  NPO after MN NO Solid Food.  Clear liquids from MN until---430 am then npo Medications to take morning of surgery -----plaquenil Diabetic medication -----n/a Patient Special Instructions -----none Pre-Op special Istructions -----none Patient verbalized understanding of instructions that were given at this phone interview. Patient denies shortness of breath, chest pain, fever, cough at this phone interview.

## 2020-01-12 ENCOUNTER — Other Ambulatory Visit (HOSPITAL_COMMUNITY)
Admission: RE | Admit: 2020-01-12 | Discharge: 2020-01-12 | Disposition: A | Discharge status: Home / Self Care | Payer: 59 | Source: Ambulatory Visit | Attending: Obstetrics and Gynecology | Admitting: Obstetrics and Gynecology

## 2020-01-12 DIAGNOSIS — Z20822 Contact with and (suspected) exposure to covid-19: Secondary | ICD-10-CM | POA: Insufficient documentation

## 2020-01-12 DIAGNOSIS — Z01812 Encounter for preprocedural laboratory examination: Secondary | ICD-10-CM | POA: Diagnosis not present

## 2020-01-12 LAB — SARS CORONAVIRUS 2 (TAT 6-24 HRS): SARS Coronavirus 2: NEGATIVE

## 2020-01-13 ENCOUNTER — Encounter: Payer: Self-pay | Admitting: Gynecology

## 2020-01-16 ENCOUNTER — Ambulatory Visit (HOSPITAL_BASED_OUTPATIENT_CLINIC_OR_DEPARTMENT_OTHER)
Admission: RE | Admit: 2020-01-16 | Discharge: 2020-01-16 | Disposition: A | Discharge status: Home / Self Care | Payer: 59 | Source: Ambulatory Visit | Attending: Obstetrics and Gynecology | Admitting: Obstetrics and Gynecology

## 2020-01-16 ENCOUNTER — Ambulatory Visit (HOSPITAL_BASED_OUTPATIENT_CLINIC_OR_DEPARTMENT_OTHER): Payer: 59 | Admitting: Anesthesiology

## 2020-01-16 ENCOUNTER — Encounter (HOSPITAL_BASED_OUTPATIENT_CLINIC_OR_DEPARTMENT_OTHER)
Admission: RE | Disposition: A | Discharge status: Home / Self Care | Payer: Self-pay | Source: Ambulatory Visit | Attending: Obstetrics and Gynecology

## 2020-01-16 ENCOUNTER — Encounter (HOSPITAL_BASED_OUTPATIENT_CLINIC_OR_DEPARTMENT_OTHER): Payer: Self-pay | Admitting: Obstetrics and Gynecology

## 2020-01-16 ENCOUNTER — Other Ambulatory Visit: Payer: Self-pay

## 2020-01-16 DIAGNOSIS — D069 Carcinoma in situ of cervix, unspecified: Secondary | ICD-10-CM | POA: Diagnosis not present

## 2020-01-16 DIAGNOSIS — Z79899 Other long term (current) drug therapy: Secondary | ICD-10-CM | POA: Insufficient documentation

## 2020-01-16 HISTORY — PX: CERVICAL CONIZATION W/BX: SHX1330

## 2020-01-16 HISTORY — DX: Carcinoma in situ of cervix, unspecified: D06.9

## 2020-01-16 LAB — POCT I-STAT, CHEM 8
BUN: 6 mg/dL (ref 6–20)
Calcium, Ion: 1.28 mmol/L (ref 1.15–1.40)
Chloride: 109 mmol/L (ref 98–111)
Creatinine, Ser: 0.5 mg/dL (ref 0.44–1.00)
Glucose, Bld: 101 mg/dL — ABNORMAL HIGH (ref 70–99)
HCT: 38 % (ref 36.0–46.0)
Hemoglobin: 12.9 g/dL (ref 12.0–15.0)
Potassium: 3.5 mmol/L (ref 3.5–5.1)
Sodium: 142 mmol/L (ref 135–145)
TCO2: 22 mmol/L (ref 22–32)

## 2020-01-16 LAB — CBC
HCT: 36.4 % (ref 36.0–46.0)
Hemoglobin: 12.3 g/dL (ref 12.0–15.0)
MCH: 28.4 pg (ref 26.0–34.0)
MCHC: 33.8 g/dL (ref 30.0–36.0)
MCV: 84.1 fL (ref 80.0–100.0)
Platelets: 236 10*3/uL (ref 150–400)
RBC: 4.33 MIL/uL (ref 3.87–5.11)
RDW: 12.6 % (ref 11.5–15.5)
WBC: 4.4 10*3/uL (ref 4.0–10.5)
nRBC: 0 % (ref 0.0–0.2)

## 2020-01-16 LAB — POCT PREGNANCY, URINE: Preg Test, Ur: NEGATIVE

## 2020-01-16 SURGERY — CONE BIOPSY, CERVIX
Anesthesia: General | Site: Vagina

## 2020-01-16 MED ORDER — MIDAZOLAM HCL 5 MG/5ML IJ SOLN
INTRAMUSCULAR | Status: DC | PRN
  Administered 2020-01-16: 2 mg via INTRAVENOUS

## 2020-01-16 MED ORDER — LIDOCAINE-EPINEPHRINE (PF) 1 %-1:200000 IJ SOLN
INTRAMUSCULAR | Status: DC | PRN
  Administered 2020-01-16: 10 mL

## 2020-01-16 MED ORDER — ACETAMINOPHEN 325 MG PO TABS: 650.0000 mg | ORAL_TABLET | ORAL | Status: DC | PRN

## 2020-01-16 MED ORDER — OXYCODONE HCL 5 MG PO TABS: 5.0000 mg | ORAL_TABLET | Freq: Four times a day (QID) | ORAL | 0 refills | Status: DC | PRN

## 2020-01-16 MED ORDER — LIDOCAINE 2% (20 MG/ML) 5 ML SYRINGE
INTRAMUSCULAR | Status: DC | PRN
  Administered 2020-01-16: 80 mg via INTRAVENOUS

## 2020-01-16 MED ORDER — ACETAMINOPHEN 500 MG PO TABS
1000.0000 mg | ORAL_TABLET | Freq: Once | ORAL | Status: AC
  Administered 2020-01-16: 1000 mg via ORAL

## 2020-01-16 MED ORDER — DOCUSATE SODIUM 100 MG PO CAPS: 100.0000 mg | ORAL_CAPSULE | Freq: Two times a day (BID) | ORAL | Status: AC | PRN

## 2020-01-16 MED ORDER — OXYCODONE HCL 5 MG PO TABS
5.0000 mg | ORAL_TABLET | ORAL | Status: DC | PRN
Start: 2020-01-16 — End: 2020-01-16

## 2020-01-16 MED ORDER — FERRIC SUBSULFATE SOLN
Status: DC | PRN
  Administered 2020-01-16: 1

## 2020-01-16 MED ORDER — KETOROLAC TROMETHAMINE 30 MG/ML IJ SOLN
INTRAMUSCULAR | Status: DC | PRN
  Administered 2020-01-16: 30 mg via INTRAVENOUS

## 2020-01-16 MED ORDER — POVIDONE-IODINE 10 % EX SWAB: 2.0000 "application " | Freq: Once | CUTANEOUS | Status: DC

## 2020-01-16 MED ORDER — IBUPROFEN 600 MG PO TABS: 600.0000 mg | ORAL_TABLET | Freq: Four times a day (QID) | ORAL | 1 refills | Status: DC

## 2020-01-16 MED ORDER — ACETAMINOPHEN 500 MG PO TABS: 1000.0000 mg | ORAL_TABLET | Freq: Four times a day (QID) | ORAL | Status: AC

## 2020-01-16 MED ORDER — PROPOFOL 10 MG/ML IV BOLUS
INTRAVENOUS | Status: DC | PRN
  Administered 2020-01-16: 150 mg via INTRAVENOUS

## 2020-01-16 MED ORDER — LACTATED RINGERS IV SOLN
INTRAVENOUS | Status: DC
  Administered 2020-01-16: 1000 mL via INTRAVENOUS

## 2020-01-16 MED ORDER — ONDANSETRON HCL 4 MG/2ML IJ SOLN
INTRAMUSCULAR | Status: AC
  Filled 2020-01-16: qty 2

## 2020-01-16 MED ORDER — KETOROLAC TROMETHAMINE 30 MG/ML IJ SOLN: 30.0000 mg | Freq: Four times a day (QID) | INTRAMUSCULAR | Status: DC

## 2020-01-16 MED ORDER — MIDAZOLAM HCL 2 MG/2ML IJ SOLN
INTRAMUSCULAR | Status: AC
  Filled 2020-01-16: qty 2

## 2020-01-16 MED ORDER — ACETAMINOPHEN 325 MG RE SUPP: 650.0000 mg | RECTAL | Status: DC | PRN

## 2020-01-16 MED ORDER — OXYCODONE HCL 5 MG PO TABS
5.0000 mg | ORAL_TABLET | Freq: Once | ORAL | Status: DC | PRN
Start: 2020-01-16 — End: 2020-01-16

## 2020-01-16 MED ORDER — ACETAMINOPHEN 500 MG PO TABS
ORAL_TABLET | ORAL | Status: AC
  Filled 2020-01-16: qty 2

## 2020-01-16 MED ORDER — FENTANYL CITRATE (PF) 250 MCG/5ML IJ SOLN
INTRAMUSCULAR | Status: AC
  Filled 2020-01-16: qty 5

## 2020-01-16 MED ORDER — OXYCODONE HCL 5 MG/5ML PO SOLN: 5.0000 mg | Freq: Once | ORAL | Status: DC | PRN

## 2020-01-16 MED ORDER — SODIUM CHLORIDE 0.9 % IV SOLN: 250.0000 mL | INTRAVENOUS | Status: DC | PRN

## 2020-01-16 MED ORDER — PROPOFOL 10 MG/ML IV BOLUS
INTRAVENOUS | Status: AC
  Filled 2020-01-16: qty 20

## 2020-01-16 MED ORDER — SODIUM CHLORIDE 0.9% FLUSH: 3.0000 mL | Freq: Two times a day (BID) | INTRAVENOUS | Status: DC

## 2020-01-16 MED ORDER — SCOPOLAMINE 1 MG/3DAYS TD PT72
MEDICATED_PATCH | TRANSDERMAL | Status: AC
  Filled 2020-01-16: qty 1

## 2020-01-16 MED ORDER — ACETIC ACID 4% SOLUTION
Status: DC | PRN
  Administered 2020-01-16: 1 via TOPICAL

## 2020-01-16 MED ORDER — DEXAMETHASONE SODIUM PHOSPHATE 10 MG/ML IJ SOLN
INTRAMUSCULAR | Status: AC
  Filled 2020-01-16: qty 1

## 2020-01-16 MED ORDER — FENTANYL CITRATE (PF) 100 MCG/2ML IJ SOLN: 25.0000 ug | INTRAMUSCULAR | Status: DC | PRN

## 2020-01-16 MED ORDER — LACTATED RINGERS IV SOLN: INTRAVENOUS | Status: DC

## 2020-01-16 MED ORDER — ONDANSETRON HCL 4 MG/2ML IJ SOLN
INTRAMUSCULAR | Status: DC | PRN
  Administered 2020-01-16: 4 mg via INTRAVENOUS

## 2020-01-16 MED ORDER — SCOPOLAMINE 1 MG/3DAYS TD PT72
1.0000 | MEDICATED_PATCH | TRANSDERMAL | Status: DC
  Administered 2020-01-16: 1.5 mg via TRANSDERMAL

## 2020-01-16 MED ORDER — PROMETHAZINE HCL 25 MG/ML IJ SOLN: 6.2500 mg | INTRAMUSCULAR | Status: DC | PRN

## 2020-01-16 MED ORDER — FENTANYL CITRATE (PF) 100 MCG/2ML IJ SOLN
INTRAMUSCULAR | Status: DC | PRN
  Administered 2020-01-16: 50 ug via INTRAVENOUS
  Administered 2020-01-16: 25 ug via INTRAVENOUS
  Administered 2020-01-16: 50 ug via INTRAVENOUS

## 2020-01-16 MED ORDER — AMISULPRIDE (ANTIEMETIC) 5 MG/2ML IV SOLN: 10.0000 mg | Freq: Once | INTRAVENOUS | Status: DC | PRN

## 2020-01-16 MED ORDER — SODIUM CHLORIDE 0.9% FLUSH: 3.0000 mL | INTRAVENOUS | Status: DC | PRN

## 2020-01-16 MED ORDER — DEXAMETHASONE SODIUM PHOSPHATE 10 MG/ML IJ SOLN
INTRAMUSCULAR | Status: DC | PRN
  Administered 2020-01-16: 10 mg via INTRAVENOUS

## 2020-01-16 SURGICAL SUPPLY — 36 items
APL SWBSTK 6 STRL LF DISP (MISCELLANEOUS)
APPLICATOR COTTON TIP 6 STRL (MISCELLANEOUS) IMPLANT
APPLICATOR COTTON TIP 6IN STRL (MISCELLANEOUS)
BLADE SURG 11 STRL SS (BLADE) ×2 IMPLANT
CANISTER SUCT 1200ML W/VALVE (MISCELLANEOUS) IMPLANT
CANISTER SUCT 3000ML PPV (MISCELLANEOUS) IMPLANT
COVER BACK TABLE 60X90IN (DRAPES) IMPLANT
COVER WAND RF STERILE (DRAPES) ×2 IMPLANT
DRAPE UNDERBUTTOCKS STRL (DISPOSABLE) ×2 IMPLANT
ELECT BALL LEEP 3MM BLK (ELECTRODE) IMPLANT
ELECT BALL LEEP 5MM RED (ELECTRODE) IMPLANT
GAUZE 4X4 16PLY RFD (DISPOSABLE) ×2 IMPLANT
GLOVE BIO SURGEON STRL SZ7 (GLOVE) ×2 IMPLANT
GLOVE BIO SURGEON STRL SZ8 (GLOVE) ×2 IMPLANT
GLOVE BIOGEL PI IND STRL 7.0 (GLOVE) ×1 IMPLANT
GLOVE BIOGEL PI IND STRL 7.5 (GLOVE) ×1 IMPLANT
GLOVE BIOGEL PI INDICATOR 7.0 (GLOVE) ×1
GLOVE BIOGEL PI INDICATOR 7.5 (GLOVE) ×1
GOWN STRL REUS W/ TWL LRG LVL3 (GOWN DISPOSABLE) ×1 IMPLANT
GOWN STRL REUS W/TWL LRG LVL3 (GOWN DISPOSABLE) ×2
GOWN STRL REUS W/TWL XL LVL3 (GOWN DISPOSABLE) ×2 IMPLANT
KIT TURNOVER CYSTO (KITS) ×2 IMPLANT
MANIFOLD NEPTUNE II (INSTRUMENTS) ×2 IMPLANT
NS IRRIG 500ML POUR BTL (IV SOLUTION) ×2 IMPLANT
PACK VAGINAL WOMENS (CUSTOM PROCEDURE TRAY) ×2 IMPLANT
PAD OB MATERNITY 4.3X12.25 (PERSONAL CARE ITEMS) ×2 IMPLANT
PAD PREP 24X48 CUFFED NSTRL (MISCELLANEOUS) ×2 IMPLANT
SCOPETTES 8  STERILE (MISCELLANEOUS) ×2
SCOPETTES 8 STERILE (MISCELLANEOUS) ×2 IMPLANT
SPONGE SURGIFOAM ABS GEL 12-7 (HEMOSTASIS) IMPLANT
SUT CHROMIC 0 CT 1 (SUTURE) IMPLANT
SUT SILK 0 PSL NDL (SUTURE) ×2 IMPLANT
SUT VIC AB 0 CT1 36 (SUTURE) ×2 IMPLANT
TOWEL OR 17X26 10 PK STRL BLUE (TOWEL DISPOSABLE) ×2 IMPLANT
VACUUM HOSE 7/8X10 W/ WAND (MISCELLANEOUS) ×2 IMPLANT
WATER STERILE IRR 500ML POUR (IV SOLUTION) IMPLANT

## 2020-01-16 NOTE — Transfer of Care (Signed)
Immediate Anesthesia Transfer of Care Note  Patient: Emily Hicks  Procedure(s) Performed: CERVICAL CONE BIOPSY, ENDOCERVICAL CURETTAGE (N/A Vagina )  Patient Location: PACU  Anesthesia Type:General  Level of Consciousness: awake, alert , oriented and patient cooperative  Airway & Oxygen Therapy: Patient Spontanous Breathing  Post-op Assessment: Report given to RN and Post -op Vital signs reviewed and stable  Post vital signs: Reviewed and stable  Last Vitals:  Vitals Value Taken Time  BP 129/78 01/16/20 0816  Temp 36.7 C 01/16/20 0816  Pulse 87 01/16/20 0820  Resp 18 01/16/20 0820  SpO2 98 % 01/16/20 0820  Vitals shown include unvalidated device data.  Last Pain:  Vitals:   01/16/20 0816  TempSrc:   PainSc: 0-No pain      Patients Stated Pain Goal: 7 (01/16/20 0641)  Complications: No complications documented.

## 2020-01-16 NOTE — Anesthesia Procedure Notes (Signed)
Procedure Name: LMA Insertion Date/Time: 01/16/2020 7:31 AM Performed by: Bishop Limbo, CRNA Pre-anesthesia Checklist: Patient identified, Emergency Drugs available, Suction available and Patient being monitored Patient Re-evaluated:Patient Re-evaluated prior to induction Oxygen Delivery Method: Circle System Utilized Preoxygenation: Pre-oxygenation with 100% oxygen Induction Type: IV induction Ventilation: Mask ventilation without difficulty LMA: LMA inserted LMA Size: 4.0 Number of attempts: 1 Placement Confirmation: positive ETCO2 Tube secured with: Tape Dental Injury: Teeth and Oropharynx as per pre-operative assessment

## 2020-01-16 NOTE — Op Note (Signed)
Name: SHENIKA QUINT Age: 34 y.o.  Date of Birth: 06-13-1985  Medical Record #: 10-12-1986   Operative Note  Operative Diagnosis: Severe cervical dysplasia/CIN 3/carcinoma in situ on biopsy Procedure: Cold knife conization, post procedure endocervical curettage Postoperative Diagnosis: Same Estimated Blood Loss: 10 mL Surgeon: Theresia Majors, MD Anesthesia: General LMA, local 1% lidocaine with 1:200,000 epinephrine Specimens: Cervical cone with long tail suture at 12:00, short tail suture at 6:00, double suture at deep margin, Endocervical curettings. Complications: None Date: 01/16/20  Under anesthesia, [name] was placed in dorsolithotomy position in yellofin stirrups with SCDs applied and prepped without disruption of cervix or upper vagina.  A surgical team time out was performed to verify and agree on procedure and patient consent.   Potential lesions were identified with application of dilute acetic acid solution and Lugol's solution to the cervix.  A single tooth tenaculum was applied to the anterior lip of the cervix.  A stay suture with #0 Vicryl was placed at the cervicovaginal interface at the 3 and 9 o'clock positions.  Circumferential injection of local anesthetic into the cervix was performed.  The uterine cavity was sounded.  A number 11 blade was used to make a circumferential incision around the endocervical canal encompassing potential lesions.  A bevel cut completed the cone and the sample was marked with silk suture at the 12 and 6 o'clock positions and the deep margin as noted above.   A serrated curette was used to collect post procedure endocervical curettings.  Tissue was submitted to Pathology for review.  Light fulguration with the ball tip electrosurgical pen and application of pressure with Monsel's solution to the cervix was used for hemostasis.  Instrument, sponge and needle counts were correct at the conclusion of the procedure.  The patient was brought to recovery  in stable condition.   Theresia Majors, M.D., [facility]

## 2020-01-16 NOTE — Discharge Instructions (Signed)
Cervical Conization, Care After This sheet gives you information about how to care for yourself after your procedure. Your doctor may also give you more specific instructions. If you have problems or questions, contact your doctor. What can I expect after the procedure? After the procedure, it is common to have:  A groggy feeling, if you were given medicine to make you fall asleep (general anesthetic).  Cramps that feel like menstrual cramps.  Bloody discharge or light to moderate bleeding.  Dark fluid from the vagina (vaginal discharge). This fluid may look similar to coffee grounds. Follow these instructions at home: Medicines  Take over-the-counter and prescription medicines only as told by your doctor.  Do not take aspirin until your doctor says it is okay. Activity   Rest as told by your doctor.  For 7-14 days after your procedure, avoid: ? Being very active. ? Exercising. ? Heavy lifting.  Do not lift anything that is heavier than 10 lb (4.5 kg), or the limit that you are told, until your doctor says that it is safe.  Return to your normal activities as told by your doctor. Ask your doctor what activities are safe for you. General instructions   You may eat your normal diet unless your doctor tells you not to do so.  Drink enough fluid to keep your pee (urine) pale yellow.  Do not take baths, swim, or use a hot tub until your doctor approves. Ask your doctor if you may take showers. You may only be allowed to take sponge baths.  Do not douche, have sex, or put anything in the vagina, including tampons, until your doctor says it is okay.  Keep all follow-up visits as told by your doctor. This is important. Contact a doctor if:  You get a rash.  You are dizzy or light-headed.  You feel like you may vomit (nauseous).  You vomit.  You have fluid from your vagina that smells bad. Get help right away if:  There are bloody clumps (clots) coming from your  vagina.  You have more bleeding than you would have in a normal menstrual period. For example, you soak a pad in less than 1 hour.  You have a fever.  You have more and more cramps.  You pass out (faint).  You have pain when peeing.  You have very bad pain, or your pain gets worse. Medicine does not help your pain.  You have blood in your pee. Summary  After the procedure, it is common to have cramps, some bleeding, and dark or bloody fluid from your vagina.  Do not douche, have sex, or use tampons until your doctor says it is okay.  For about 7-14 days after your procedure, try not to exercise or lift heavy objects. This information is not intended to replace advice given to you by your health care provider. Make sure you discuss any questions you have with your health care provider. Document Revised: 07/19/2018 Document Reviewed: 07/19/2018 Elsevier Patient Education  2020 [employer].   Post Anesthesia Home Care Instructions  Activity: Get plenty of rest for the remainder of the day. A responsible individual must stay with you for 24 hours following the procedure.  For the next 24 hours, DO NOT: -Drive a car -Advertising copywriter -Drink alcoholic beverages -Take any medication unless instructed by your physician -Make any legal decisions or sign important papers.  Meals: Start with liquid foods such as gelatin or soup. Progress to regular foods as tolerated. Avoid greasy,  spicy, heavy foods. If nausea and/or vomiting occur, drink only clear liquids until the nausea and/or vomiting subsides. Call your physician if vomiting continues.  Special Instructions/Symptoms: Your throat may feel dry or sore from the anesthesia or the breathing tube placed in your throat during surgery. If this causes discomfort, gargle with warm salt water. The discomfort should disappear within 24 hours.  If you had a scopolamine patch placed behind your ear for the management of post- operative  nausea and/or vomiting:  1. The medication in the patch is effective for 72 hours, after which it should be removed.  Wrap patch in a tissue and discard in the trash. Wash hands thoroughly with soap and water. 2. You may remove the patch earlier than 72 hours if you experience unpleasant side effects which may include dry mouth, dizziness or visual disturbances. 3. Avoid touching the patch. Wash your hands with soap and water after contact with the patch.     

## 2020-01-16 NOTE — Anesthesia Postprocedure Evaluation (Signed)
Anesthesia Post Note  Patient: Emily Hicks  Procedure(s) Performed: CERVICAL CONE BIOPSY, ENDOCERVICAL CURETTAGE (N/A Vagina )     Patient location during evaluation: PACU Anesthesia Type: General Level of consciousness: sedated Pain management: pain level controlled Vital Signs Assessment: post-procedure vital signs reviewed and stable Respiratory status: spontaneous breathing and respiratory function stable Cardiovascular status: stable Postop Assessment: no apparent nausea or vomiting Anesthetic complications: no   No complications documented.  Last Vitals:  Vitals:   01/16/20 0845 01/16/20 0934  BP: (!) 143/95 (!) 147/95  Pulse: 73 74  Resp: 14 12  Temp: 36.7 C 36.7 C  SpO2: 99% 99%    Last Pain:  Vitals:   01/16/20 0845  TempSrc:   PainSc: 2                  Mellody Dance

## 2020-01-16 NOTE — H&P (Signed)
   Emily Hicks 1985-02-06 MRN: [id number]  HPI The patient is a 34 y.o. G1P0001 who presents today for scheduled cold knife conization and endocervical curettage for CIN 3/carcinoma in situ identified on recent biopsy obtained during colposcopy for HGSIL pap smear.  No changes to her medical history since her pre op exam, denies CP, SOB, fever/chills, dysuria.  Past Medical History:  Diagnosis Date  . Cervical intraepithelial neoplasia III   . Gallstones   . GERD (gastroesophageal reflux disease)    only prn OTC occasionally  . History of blood transfusion 2020  . Lupus (HCC) dx 2020  . Multiple sclerosis (HCC) dx 2020  . Numbness and tingling of left leg    Past Surgical History:  Procedure Laterality Date  . CHOLECYSTECTOMY N/A 12/28/2013   Procedure: LAPAROSCOPIC CHOLECYSTECTOMY;  Surgeon: Claud Kelp, MD;  Location: [facility];  Service: General;  Laterality: N/A;   No Known Allergies Current Facility-Administered Medications on File Prior to Encounter  Medication Dose Route Frequency Provider Last Rate Last Admin  . gadopentetate dimeglumine (MAGNEVIST) injection 20 mL  20 mL Intravenous Once PRN Levert Feinstein, MD       Current Outpatient Medications on File Prior to Encounter  Medication Sig Dispense Refill  . hydroxychloroquine (PLAQUENIL) 200 MG tablet Take 400 mg by mouth daily.    . mycophenolate (CELLCEPT) 500 MG tablet Take 500 mg by mouth 2 (two) times daily.        Physical Exam   BP (!) 136/94   Pulse 95   Temp 98.5 F (36.9 C) (Oral)   Resp 18   Ht 5\' 1"  (1.549 m)   Wt 100.2 kg   SpO2 100%   BMI 41.76 kg/m    General: Pleasant female, no acute distress, alert and oriented CV: RRR, no murmurs Pulm: good respiratory effort, CTAB     Plan Proceed with cold knife conization and endocervical curettage as planned.  Post operative recovery expectations reviewed.  All questions answered and the patient agrees to proceed.    , MD 01/16/20

## 2020-01-16 NOTE — Anesthesia Preprocedure Evaluation (Signed)
Anesthesia Evaluation  Patient identified by MRN, date of birth, ID band Patient awake    Reviewed: Allergy & Precautions, H&P , Patient's Chart, lab work & pertinent test results, reviewed documented beta blocker date and time   Airway Mallampati: III  TM Distance: >3 FB Neck ROM: full    Dental no notable dental hx.    Pulmonary    Pulmonary exam normal breath sounds clear to auscultation       Cardiovascular Exercise Tolerance: Good hypertension,  Rhythm:regular Rate:Normal     Neuro/Psych  Headaches, Anxiety Depression    GI/Hepatic GERD  ,  Endo/Other  Morbid obesity  Renal/GU Renal disease  negative genitourinary   Musculoskeletal  (+) Arthritis ,   Abdominal   Peds  Hematology   Anesthesia Other Findings   Reproductive/Obstetrics negative OB ROS                             Anesthesia Physical  Anesthesia Plan  ASA: III  Anesthesia Plan: General   Post-op Pain Management:    Induction: Intravenous  PONV Risk Score and Plan: 2 and Scopolamine patch - Pre-op, Ondansetron and Dexamethasone  Airway Management Planned: LMA  Additional Equipment:   Intra-op Plan:   Post-operative Plan: Extubation in OR  Informed Consent: I have reviewed the patients History and Physical, chart, labs and discussed the procedure including the risks, benefits and alternatives for the proposed anesthesia with the patient or authorized representative who has indicated his/her understanding and acceptance.     Dental advisory given  Plan Discussed with: CRNA, Surgeon and Anesthesiologist  Anesthesia Plan Comments: ( )        Anesthesia Quick Evaluation

## 2020-01-17 ENCOUNTER — Encounter (HOSPITAL_BASED_OUTPATIENT_CLINIC_OR_DEPARTMENT_OTHER): Payer: Self-pay | Admitting: Obstetrics and Gynecology

## 2020-01-17 LAB — SURGICAL PATHOLOGY

## 2020-01-20 LAB — TYPE AND SCREEN
ABO/RH(D): O POS
Antibody Screen: POSITIVE
DAT, IgG: POSITIVE
Unit division: 0

## 2020-01-20 LAB — BPAM RBC
Blood Product Expiration Date: 202201122359
Unit Type and Rh: 5100

## 2020-01-30 ENCOUNTER — Other Ambulatory Visit: Payer: Self-pay | Admitting: Obstetrics and Gynecology

## 2020-02-07 ENCOUNTER — Ambulatory Visit: Payer: 59 | Admitting: Obstetrics and Gynecology

## 2020-02-09 ENCOUNTER — Encounter: Payer: Self-pay | Admitting: Obstetrics and Gynecology

## 2020-02-09 ENCOUNTER — Ambulatory Visit (INDEPENDENT_AMBULATORY_CARE_PROVIDER_SITE_OTHER): Payer: 59 | Admitting: Obstetrics and Gynecology

## 2020-02-09 ENCOUNTER — Other Ambulatory Visit: Payer: Self-pay

## 2020-02-09 VITALS — BP 126/80

## 2020-02-09 DIAGNOSIS — Z9889 Other specified postprocedural states: Secondary | ICD-10-CM

## 2020-02-09 DIAGNOSIS — D069 Carcinoma in situ of cervix, unspecified: Secondary | ICD-10-CM | POA: Insufficient documentation

## 2020-02-09 NOTE — Progress Notes (Signed)
   Emily Hicks 09/09/1985 [id number]  SUBJECTIVE   REASON FOR APPOINTMENT Chief Complaint  Patient presents with  . Routine Post Op    CERVICAL CONE BIOPSY, ENDOCERVICAL CURETTAGE     HISTORY OF PRESENT ILLNESS Emily Hicks  presents for routine 3 week post-operative follow up after a cold knife conization and endocervical curettage performed on 01/16/2020 for severe cervical dysplasia/CIN 3/carcinoma in situ on biopsy.  The patient is doing well with no concerns.  She denies vaginal bleeding or discharge. She is tolerating normal diet.  Bowel and bladder function are normal.   OBJECTIVE  BP 126/80 (BP Location: Right Arm, Patient Position: Sitting, Cuff Size: Normal)    PHYSICAL EXAM General:  Patient in no acute distress.  Pelvic: Cervical cone bed appears well-healing, no bleeding or purulence, stay sutures have dissolved  SURGICAL PATHOLOGY  CASE: [id number]  PATIENT: Emily Hicks  Surgical Pathology Report   Clinical History: Cervical intraepithelial neoplasia 3; carcinoma in  SITU (crm)   FINAL MICROSCOPIC DIAGNOSIS:   A. ENDOCERVIX, CURETTAGE:  - Benign inactive endometrium  - Scant benign cervical glandular mucosa   B. CERVIX, CONE:  - High-grade squamous intraepithelial lesion (CIN3, high grade  dysplasia) with extension into underlying cervical glands and involving  all 4 quadrants  - All margins are negative for high-grade dysplasia  - Low-grade dysplasia is present at the ectocervical margin in all 4  quadrants  - No evidence of invasive carcinoma    ASSESSMENT / PLAN  Routine 3 week post operative check.   The patient is doing well and meeting all postoperative milestones.  I reviewed the pathology report which shows there was CIN-3 with all margins on the cone specimen negative for high-grade dysplasia, margins were positive for low-grade dysplasia; there was no evidence of invasive carcinoma.  With her autoimmune condition and immune suppression  medications, I recommended that she return for repeat Pap smear and HPV cotesting in 6 months and will decide from there the Pap smear interval after that she should require follow-up for at least 2 years with Pap smear surveillance.  She may otherwise return to normal activities without restriction.   Theresia Majors MD 02/09/20

## 2020-02-13 ENCOUNTER — Other Ambulatory Visit: Payer: Self-pay | Admitting: Obstetrics and Gynecology

## 2020-02-16 ENCOUNTER — Other Ambulatory Visit: Payer: Self-pay

## 2020-02-16 ENCOUNTER — Ambulatory Visit: Payer: 59 | Admitting: Neurology

## 2020-02-16 ENCOUNTER — Encounter: Payer: Self-pay | Admitting: Neurology

## 2020-02-16 VITALS — BP 129/89 | HR 107 | Ht 61.0 in | Wt 218.5 lb

## 2020-02-16 DIAGNOSIS — R200 Anesthesia of skin: Secondary | ICD-10-CM

## 2020-02-16 DIAGNOSIS — G35 Multiple sclerosis: Secondary | ICD-10-CM | POA: Diagnosis not present

## 2020-02-16 DIAGNOSIS — M329 Systemic lupus erythematosus, unspecified: Secondary | ICD-10-CM | POA: Diagnosis not present

## 2020-02-16 DIAGNOSIS — R269 Unspecified abnormalities of gait and mobility: Secondary | ICD-10-CM

## 2020-02-16 NOTE — Progress Notes (Signed)
[facility]  PATIENT: Emily Hicks DOB: 1985-06-03  REFERRING DOCTOR OR PCP:  Dr. Jeannine Kitten (PCP) SOURCE: patient, lab reports, MRI reports, multiple MRI images personally reviewed.  _________________________________   HISTORICAL  CHIEF COMPLAINT:  Chief Complaint  Patient presents with  . Follow-up    RM 12, alone. Last seen 05/05/2019. On cellcept for MS/SLE. Uses walker while ambulating.     HISTORY OF PRESENT ILLNESS:  She is a 35 year old woman who was diagnosed with MS in early 2020 but has had symptoms since 2010.   She also has SLE.  Update 02/16/2020: She is not on any disease modifying therapy for her MS at this time but is on therapy for SLE.  She was placed on Cellcept 1000 mg bid and plaquenil 200 mg po bid.  Both the lupus and the MS appear to be stable.  Some studies have suggested that CellCept has some benefit in MS though the extent of the benefit is unclear..    Neulologically, she is stable.  Gait and balance are fine. She can go up/down stairs without using the bannister.   She has left arm tingling which is not troublesome..    Limb strength is fine.     Bladder function is fine.   Vision is fine.   She sees ophthalmology regularly and had an exam last week.     She has some fatigue that fluctuates.   Some days she is very tired after work.   She sleeps well most nights.  Mood is fine.  Cognition is fine.   She has some headaches once a month and they respond to OTC analgesics.   This is improved.    She sees Dr. Kathlene November [facility]) for her SLE.    MS Clinical course:   Around 2010, she had numbness/tingling in her left thigh and cramps in her toes.   While pregnant a little time later, she improved.   After her sone was born, the left leg leg symptoms returned.   She saw Dr. Krista Blue and an MRI of the brain showed a few scattered foci but was mostly nonspecific.   She had more intense numbness in the left leg but also numbness in  the left face and a repeat 2016 brain MRI showed a few more lesions.    Numbness symptoms did not improve but early 2020 she had more intense numbness and an MRI of the brain and cervical spine was performed.   She had 3-4 lesions in her cervical spine and the brian showed no definite less lesions.   An LP ws then performed and showed > 5 oligoclonal bands.   MS was diagnosed.    She started Aubagio and took only one week before having a hospitalization for fever, hypernatremia, joint inflammation, thrush) and it was stopped.    First seen by Dr. Felecia Shelling 09/08/2018.    MRI brain 05/24/19 shows foci in the hemispheres and in the right cerebral peduncle consistent with MS.  No changes compared to 07/27/2018.  Normal enhancement pattern. MRI cervical spine 05/24/19 shows T2 hyperintense foci to the right adjacent to C2, to the left adjacent to C4-C5, towards the left adjacent to C5 and towards the right adjacent to C6-C7.  No change compared to the 2020 MRI. MRI brain 07/27/18: Formal report was Normal - does show a few small T2/Flair lesions, 2 periventricular and the rest deep white matter MRI cervical spine 03/24/18 shows 3-4 lesions (C4 left, C5 left  and C7 right also C2 on sagittal only) c/w demyelinating plaque. MRI brain 03/25/18:  Some scattered T2/Flair foci in hemispheres 1-2 perventricular MRI lumbar 08/02/18:  Normal  11/15/2014: By the report:   6 periventricular and subcortical foci - 1 new focus in left posterior temporal white matter and 2 punctate left frontal lobe lesions compared to 2011.   Other lesions stable.  I also see a right cerebral peduncle lesion that may explain her symptoms   MS and other Lab Studies:  CSF showed > 5 OCB and increased IgG index (1.57) 04/16/2018.   Positive ANA with increased dsDNA, ENARNP, ENA SM, SSA.   ESR = 27, Vit D low (15-17)  Other medical problems: She has SLE and is on Plaquenil (Dr. Kathlene November [phone])  She was hospitalized for SLE, hypernatremia, elevated  LFTs and thrush 07/27/18,   Of note LFTs were normal 09/08/2018.   She did take Aubagio x 1 week before she was hospitalized.     REVIEW OF SYSTEMS: Constitutional: No fevers, chills, sweats, or change in appetite Eyes: No visual changes, double vision, eye pain Ear, nose and throat: No hearing loss, ear pain, nasal congestion, sore throat Cardiovascular: No chest pain, palpitations Respiratory: No shortness of breath at rest or with exertion.   No wheezes GastrointestinaI: No nausea, vomiting, diarrhea, abdominal pain, fecal incontinence Genitourinary: No dysuria, urinary retention or frequency.  No nocturia. Musculoskeletal: No neck pain, back pain Integumentary: No rash, pruritus, skin lesions Neurological: as above Psychiatric: No depression at this time.  No anxiety Endocrine: No palpitations, diaphoresis, change in appetite, change in weigh or increased thirst Hematologic/Lymphatic: No anemia, purpura, petechiae. Allergic/Immunologic: No itchy/runny eyes, nasal congestion, recent allergic reactions, rashes  ALLERGIES: No Known Allergies  HOME MEDICATIONS:  Current Outpatient Medications:  .  hydroxychloroquine (PLAQUENIL) 200 MG tablet, Take 400 mg by mouth daily., Disp: , Rfl:  .  mycophenolate (CELLCEPT) 500 MG tablet, Take 500 mg by mouth 2 (two) times daily., Disp: , Rfl:   Current Facility-Administered Medications:  .  medroxyPROGESTERone (DEPO-PROVERA) injection 150 mg, 150 mg, Intramuscular, Q90 days, McDiarmid, Blane Ohara, MD, 150 mg at 01/09/20 1507  Facility-Administered Medications Ordered in Other Visits:  .  gadopentetate dimeglumine (MAGNEVIST) injection 20 mL, 20 mL, Intravenous, Once PRN, Marcial Pacas, MD  PAST MEDICAL HISTORY: Past Medical History:  Diagnosis Date  . Cervical intraepithelial neoplasia III   . Gallstones   . GERD (gastroesophageal reflux disease)    only prn OTC occasionally  . History of blood transfusion 2020  . Lupus ([place]) dx 2020  .  Multiple sclerosis ([place]) dx 2020  . Numbness and tingling of left leg     PAST SURGICAL HISTORY: Past Surgical History:  Procedure Laterality Date  . CERVICAL CONIZATION W/BX N/A 01/16/2020   Procedure: CERVICAL CONE BIOPSY, ENDOCERVICAL CURETTAGE;  Surgeon: Joseph Pierini, MD;  Location: [place];  Service: Gynecology;  Laterality: N/A;  request 7:30am OR start in [place] Gyn block requests 45 minutes  . CHOLECYSTECTOMY N/A 12/28/2013   Procedure: LAPAROSCOPIC CHOLECYSTECTOMY;  Surgeon: Fanny Skates, MD;  Location: [facility];  Service: General;  Laterality: N/A;    FAMILY HISTORY: Family History  Problem Relation Age of Onset  . Hypertension Father   . Hypertension Mother   . Hypertension Sister   . Diabetes Maternal Grandmother   . Diabetes Maternal Grandfather     SOCIAL HISTORY:  Social History   Socioeconomic History  . Marital status: Married    Spouse  name: Not on file  . Number of children: 1  . Years of education: 88  . Highest education level: Bachelor's degree (e.g., BA, AB, BS)  Occupational History  . Occupation: Accounting  Tobacco Use  . Smoking status: Never Smoker  . Smokeless tobacco: Never Used  Vaping Use  . Vaping Use: Never used  Substance and Sexual Activity  . Alcohol use: No  . Drug use: No  . Sexual activity: Yes    Birth control/protection: Injection    Comment: 1st intercourse 35 yo-Fewer than 5 partners  Other Topics Concern  . Not on file  Social History Narrative   Lives at home with husband and son.   Right-handed.   No caffeine use.   Social Determinants of Health   Financial Resource Strain: Not on file  Food Insecurity: Not on file  Transportation Needs: Not on file  Physical Activity: Not on file  Stress: Not on file  Social Connections: Not on file  Intimate Partner Violence: Not on file     PHYSICAL EXAM  Vitals:   02/16/20 0817  BP: 129/89  Pulse: (!) 107  Weight: 218 lb 8 oz (99.1 kg)   Height: '5\' 1"'  (1.549 m)    Body mass index is 41.29 kg/m.   General: The patient is well-developed and well-nourished and in no acute distress   Skin: Extremities are without rash or  edema.   Neurologic Exam  Mental status: The patient is alert and oriented x 3 at the time of the examination. The patient has apparent normal recent and remote memory, with an apparently normal attention span and concentration ability.   Speech is normal.  Cranial nerves: Extraocular movements are full.  Facial strength and sensation is normal..  Trapezius and sternocleidomastoid strength is normal. No dysarthria is noted.   No obvious hearing deficits are noted.  Motor:  Muscle bulk is normal.   Tone is normal. Strength is  5 / 5 in all 4 extremities  Sensory: Sensory testing is intact to pinprick, soft touch and vibration sensation on the right but reduced with allodynia to touch in the left arm and leg  Coordination: Cerebellar testing reveals good finger-nose-finger and now normal heel-to-shin bilaterally.  Gait and station: Station is normal.  Gait is normal.  Tandem gait is mildly wide..  Romberg is borderline.  Reflexes: Deep tendon reflexes are symmetric and normal bilaterally.         DIAGNOSTIC DATA (LABS, IMAGING, TESTING) - I reviewed patient records, labs, notes, testing and imaging myself where available.  Lab Results  Component Value Date   WBC 4.4 01/16/2020   HGB 12.9 01/16/2020   HCT 38.0 01/16/2020   MCV 84.1 01/16/2020   PLT 236 01/16/2020      Component Value Date/Time   NA 142 01/16/2020 0631   NA 143 09/29/2019 1035   K 3.5 01/16/2020 0631   CL 109 01/16/2020 0631   CO2 22 09/29/2019 1035   GLUCOSE 101 (H) 01/16/2020 0631   BUN 6 01/16/2020 0631   BUN 7 09/29/2019 1035   CREATININE 0.50 01/16/2020 0631   CREATININE 0.71 10/29/2010 0934   CALCIUM 9.6 09/29/2019 1035   PROT 6.7 09/29/2019 1035   ALBUMIN 4.2 09/29/2019 1035   ALBUMIN 4.0 04/16/2018 1225    AST 11 09/29/2019 1035   ALT 10 09/29/2019 1035   ALKPHOS 89 09/29/2019 1035   BILITOT 0.6 09/29/2019 1035   GFRNONAA 110 09/29/2019 1035   GFRAA 127 09/29/2019 1035  Lab Results  Component Value Date   CHOL 79 12/20/2015   HDL 31 (L) 12/20/2015   LDLCALC 34 12/20/2015   TRIG 71 12/20/2015   CHOLHDL 2.5 12/20/2015   Lab Results  Component Value Date   HGBA1C 5.4 06/07/2012   Lab Results  Component Value Date   VITAMINB12 545 08/09/2018   Lab Results  Component Value Date   TSH 0.401 (L) 09/29/2019       ASSESSMENT AND PLAN  1. Multiple sclerosis ([place])   2. SLE (systemic lupus erythematosus related syndrome) (HCC)   3. Gait disturbance   4. Numbness   5. Numbness on left side      1.   She will continue Cellcept.  Although there are not extensive studies, there is some evidence that CellCept is beneficial for RRMS. 2.  We will check MRI of brain and cervical spine in April, or sooner if new symptoms, to determine if subclinical progression. in .   If any new lesions will need to initiate a DMT and will d/w Dr. Kathlene November to make sure the combination with cellcept would be ok for her SLE. 3.  Stay active and exercise as tolerated. 4.    Vit D supps - 5000 units daily 5.   She will return to see me in 6 months but sooner based on a treatment decision.   Jedrek Dinovo A. Felecia Shelling, MD, [facility] 0/88/1103, 1:59 AM Certified in Neurology, Clinical Neurophysiology, Sleep Medicine and Neuroimaging  [facility] [address], [place], [place] [address] [phone]

## 2020-02-23 ENCOUNTER — Telehealth: Payer: Self-pay | Admitting: Neurology

## 2020-02-23 NOTE — Telephone Encounter (Signed)
Per Dr. Epimenio Foot notes he wanted the patient to schedule in April. I spoke with the patient she is scheduled at [facility] for 05/16/20.

## 2020-04-02 ENCOUNTER — Other Ambulatory Visit: Payer: Self-pay

## 2020-04-02 ENCOUNTER — Ambulatory Visit (INDEPENDENT_AMBULATORY_CARE_PROVIDER_SITE_OTHER): Payer: 59

## 2020-04-02 DIAGNOSIS — Z23 Encounter for immunization: Secondary | ICD-10-CM | POA: Diagnosis not present

## 2020-04-02 DIAGNOSIS — Z3042 Encounter for surveillance of injectable contraceptive: Secondary | ICD-10-CM | POA: Diagnosis not present

## 2020-04-02 NOTE — Progress Notes (Signed)
   Covid-19 Vaccination Clinic  Name:  Emily Hicks    MRN: [id number] DOB: 1985/08/01  04/02/2020   Patient presents to nurse clinic for COVID booster. Administered in LD, site unremarkable, tolerated injection well.   Emily Hicks was observed post Covid-19 immunization for 15 minutes without incident. She was provided with Vaccine Information Sheet and instruction to access the V-Safe system.   Emily Hicks was instructed to call 911 with any severe reactions post vaccine: Marland Kitchen Difficulty breathing  . Swelling of face and throat  . A fast heartbeat  . A bad rash all over body  . Dizziness and weakness   Immunizations Administered    Name Date Dose VIS Date Route   PFIZER Comrnaty(Gray TOP) Covid-19 Vaccine 04/02/2020  2:44 PM 0.3 mL 01/12/2020 Intramuscular   Manufacturer: [employer], [employer]   Lot: [id number]   NDC: [phone]     Provided patient with updated immunization card.   Veronda Prude, RN

## 2020-04-02 NOTE — Progress Notes (Signed)
Patient here today for Depo Provera injection and is within her dates.    Last contraceptive appt was 09/29/19  Depo given in LUOQ today.  Site unremarkable & patient tolerated injection.    Next injection due 5/16-5/30.  Reminder card given.    Veronda Prude, RN

## 2020-05-15 ENCOUNTER — Telehealth: Payer: Self-pay | Admitting: Neurology

## 2020-05-15 MED ORDER — ALPRAZOLAM 0.5 MG PO TABS: ORAL_TABLET | ORAL | 0 refills | Status: DC

## 2020-05-15 NOTE — Telephone Encounter (Signed)
Pt called wanting to know when the medication will be called in for her to take before her MRI tomorrow. Pt is needing it called in to the [facility] on [address].

## 2020-05-15 NOTE — Telephone Encounter (Signed)
Called pt. Advised I will have MD call this in for her today. She verbalized understanding.

## 2020-05-15 NOTE — Telephone Encounter (Signed)
MR Brain wo contrast & MR Cervical spine wo contrast Dr. Epimen[facility] Berkley Harvey: [phone] (exp. 05/08/20 to 06/22/20) & MR Cervical spine wo contrast [employer] auth: [id number] (exp. 05/14/20 to 06/28/20). Patient is scheduled at [facility] for 05/16/20.

## 2020-05-16 ENCOUNTER — Ambulatory Visit: Payer: 59

## 2020-05-16 DIAGNOSIS — G35 Multiple sclerosis: Secondary | ICD-10-CM | POA: Diagnosis not present

## 2020-05-16 DIAGNOSIS — R200 Anesthesia of skin: Secondary | ICD-10-CM

## 2020-05-21 ENCOUNTER — Other Ambulatory Visit: Payer: Self-pay

## 2020-05-21 ENCOUNTER — Ambulatory Visit: Payer: 59 | Admitting: Family Medicine

## 2020-05-21 VITALS — BP 114/76 | HR 84 | Ht 61.0 in | Wt 218.0 lb

## 2020-05-21 DIAGNOSIS — R21 Rash and other nonspecific skin eruption: Secondary | ICD-10-CM | POA: Insufficient documentation

## 2020-05-21 MED ORDER — HYDROCORTISONE 2.5 % EX OINT: TOPICAL_OINTMENT | Freq: Two times a day (BID) | CUTANEOUS | 0 refills | Status: DC

## 2020-05-21 NOTE — Assessment & Plan Note (Signed)
Appears most consistent with a contact dermatitis.  Unclear cause.  Could consider something in her hair given the location or something on her pillow.  She has not made any changes in these products, could consider contamination.  She is not exposed to any chemicals.  Could also consider that she is inhaling something given that she is having some lesions within her nose.  Does not appear to be impetigo with any crusting lesions.  Patient will try to investigate if anything else has changed.  Encouraged washing of her pillowcase.  We will trial hydrocortisone ointment, as cream formulation could have bothered the patient as well.  Return to care in 2 weeks if no improvement with twice daily dosing.  If does not improve, would consider referral to dermatology.  Advised patient to follow-up right away if she has worsening of her symptoms, especially if they are all over her body and she develops fever.

## 2020-05-21 NOTE — Progress Notes (Signed)
    SUBJECTIVE:   CHIEF COMPLAINT / HPI:   Rash on Face First appeared about a week ago Started on her left ear helix Also known her right ear helix Notices areas on her bilateral eyebrows Has also noticed a spot on her left eyelid Also has a small area on the top of her lip Has otherwise been feeling well Denies any changes in any lotions, shampoos, detergents She does not work with chemicals, she works as an Airline pilot She has lupus, but has never had a lupus flare that has the start of skin changes She states that her mom had a steroid cream at her house that she tried on her left ear which made it much worse and made the area opened up She has also noticed in the last few days that the inside of her nose feels very raw, dry, bleeding and crusty No difficulty breathing   PERTINENT  PMH / PSH: Lupus, hypertension, migraine, MS, HS  OBJECTIVE:   BP 114/76   Pulse 84   Ht 5\' 1"  (1.549 m)   Wt 218 lb (98.9 kg)   SpO2 99%   BMI 41.19 kg/m    Physical Exam:  General: 35 y.o. female in NAD HEENT: b/l nares with scaling skin, inflammation, crusting with dried blood Lungs: Breathing comfortably on room air Skin: scaling, excoriated mildly erythematous lesions on superior helix of bilateral ears and pinna of left ear, slight erythema and scaling noted at bilateral eyebrows, left worse than right, few scaling excoriated papules on left upper eyelid, singular small erythematous papule on upper lip at vermilion border, see images below Extremities: No edema               ASSESSMENT/PLAN:   Rash and nonspecific skin eruption Appears most consistent with a contact dermatitis.  Unclear cause.  Could consider something in her hair given the location or something on her pillow.  She has not made any changes in these products, could consider contamination.  She is not exposed to any chemicals.  Could also consider that she is inhaling something given that she is having some  lesions within her nose.  Does not appear to be impetigo with any crusting lesions.  Patient will try to investigate if anything else has changed.  Encouraged washing of her pillowcase.  We will trial hydrocortisone ointment, as cream formulation could have bothered the patient as well.  Return to care in 2 weeks if no improvement with twice daily dosing.  If does not improve, would consider referral to dermatology.  Advised patient to follow-up right away if she has worsening of her symptoms, especially if they are all over her body and she develops fever.     33, DO [facility]

## 2020-05-21 NOTE — Patient Instructions (Signed)
Thank you for coming to see me today. It was a pleasure. Today we talked about:   Use this ointment on your ears twice a day for two weeks.  Use vaseline on top of it and between.  Don't put the steroid ointment in your nose, just use vaseline and normal saline spray.  Come back if not better in 2 weeks.  If continuing to worsen, or especially if it is all over the body or if you develop fevers, you should be seen right away.   If you have any questions or concerns, please do not hesitate to call the office at [phone].  Best,   Luis Abed, DO

## 2020-06-20 ENCOUNTER — Ambulatory Visit (INDEPENDENT_AMBULATORY_CARE_PROVIDER_SITE_OTHER): Payer: 59 | Admitting: *Deleted

## 2020-06-20 ENCOUNTER — Other Ambulatory Visit: Payer: Self-pay

## 2020-06-20 DIAGNOSIS — Z3042 Encounter for surveillance of injectable contraceptive: Secondary | ICD-10-CM | POA: Diagnosis not present

## 2020-06-20 MED ORDER — MEDROXYPROGESTERONE ACETATE 150 MG/ML IM SUSY
150.0000 mg | PREFILLED_SYRINGE | Freq: Once | INTRAMUSCULAR | Status: AC
  Administered 2020-06-20: 150 mg via INTRAMUSCULAR

## 2020-06-20 NOTE — Progress Notes (Signed)
Patient here today for Depo Provera injection and is within her dates.    Last contraceptive appt was 09/2019.  Advised that next appt will need to be with provider.  Depo given in LUOQ today.  Site unremarkable & patient tolerated injection.    Next injection due 09/09/20 - 09/19/20.  Reminder card given.    Jone Baseman, CMA

## 2020-08-08 ENCOUNTER — Other Ambulatory Visit: Payer: Self-pay

## 2020-08-08 ENCOUNTER — Encounter: Payer: Self-pay | Admitting: Nurse Practitioner

## 2020-08-08 ENCOUNTER — Other Ambulatory Visit (HOSPITAL_COMMUNITY)
Admission: RE | Admit: 2020-08-08 | Discharge: 2020-08-08 | Disposition: A | Discharge status: Home / Self Care | Payer: 59 | Source: Ambulatory Visit | Attending: Nurse Practitioner | Admitting: Nurse Practitioner

## 2020-08-08 ENCOUNTER — Ambulatory Visit: Payer: 59 | Admitting: Nurse Practitioner

## 2020-08-08 VITALS — BP 140/86

## 2020-08-08 DIAGNOSIS — D069 Carcinoma in situ of cervix, unspecified: Secondary | ICD-10-CM

## 2020-08-08 NOTE — Progress Notes (Signed)
   Acute Office Visit  Subjective:    Patient ID: Emily Hicks, female    DOB: 1985/09/19, 35 y.o.   MRN: [id number]   HPI 35 y.o. presents today for 32-month pap smear. Underwent cold knife conization and endocervical curettage 01/16/2020 for severe cervical dysplasia/CIN 3/carcinoma in situ on biopsy (Routine pap was performed at PCP 09/29/2019 showing HGSIL). All margins negative for high-grade dysplasia, low-grade dysplasia present at ectocervical margin in all 4 quadrants, no evidence of invasive carcinoma. 33-month pap recommended due to her history of autoimmune disease and immunosuppressive medications. She reports a history of abnormal pap smear in the past without intervention.  2013 Pap smear was LGSIL cytology with colposcopy biopsy indicating CIN-1, 2014 and 2017 Pap smears with normal cytology.   Review of Systems  Constitutional: Negative.   Genitourinary: Negative.       Objective:    Physical Exam Constitutional:      Appearance: Normal appearance.  Genitourinary:    General: Normal vulva.     Vagina: Normal.     Cervix: Normal.    BP 140/86  Wt Readings from Last 3 Encounters:  05/21/20 218 lb (98.9 kg)  02/16/20 218 lb 8 oz (99.1 kg)  01/16/20 221 lb (100.2 kg)        Assessment & Plan:   Problem List Items Addressed This Visit   None Visit Diagnoses     High grade squamous intraepithelial lesion (HGSIL), grade 3 CIN, on biopsy of cervix    -  Primary   Relevant Orders   Cytology - PAP( [place])      Plan: Pap obtained. Will triage based on results.     Olivia Mackie DNP, 3:20 PM 08/08/2020

## 2020-08-14 LAB — CYTOLOGY - PAP
Comment: NEGATIVE
Diagnosis: NEGATIVE
High risk HPV: POSITIVE — AB

## 2020-09-25 NOTE — Assessment & Plan Note (Addendum)
Managed by rheumatology.  On CellCept and Plaquenil without adverse reactions.  Recently started on Benlysta due to flare.  We will continue to follow with rheumatology.  07/13/20: CBC nonconcerning as it is suggestive of pancytopenia as expected by medications.  CMP unremarkable.  Complement C3 and C4 unremarkable.  Hepatitis B and C testing nonreactive.

## 2020-09-25 NOTE — Patient Instructions (Signed)
It was great to see you today! Thank you for choosing [facility] for your primary care. Emily Hicks was seen for Depo-Provera injection and discussion of other birth control methods.   Our plans for today were:  -Discussed your lupus.  I am glad to see that this is staying well controlled with CellCept and Plaquenil and starting Benlysta.  I do not feel that any changes need to be made at this time.  Please continue to see rheumatology every 3 months. -Discussed your MS.  I am glad to see that this is remained stable as evidenced by MRI. -Discussed potential measures as you have been on Depo-Provera for 10 years.  Do not generally recommend this for long-term birth control measures and I would advise switching as I am concerned for prolonged bone loss due to being on Depo-Provera.  I provided some handouts to discuss at your next visit.   You should return to our clinic in 3 months for birth control discussion.   Take care and seek immediate care sooner if you develop any concerns.   Thank you for allowing me to participate in your care, [name], DO PGY-1

## 2020-09-25 NOTE — Assessment & Plan Note (Addendum)
Stable.  Managed by neurology.  Recent MRIs on 05/16/20 did not reveal any new lesions.  Currently on CellCept and Plaquenil.

## 2020-09-25 NOTE — Assessment & Plan Note (Addendum)
POCT urine pregnancy negative.  Received Depo-Provera today.  Patient has been receiving Depo-Provera for 10 years.  I have voiced concerns of bone loss over time and advised change in contraceptive management in accordance with patient's wariness of long-term use of Depo-Provera.  Provided contraceptive method information packet for patient to review and return with questions during next visit in 3 months.

## 2020-09-25 NOTE — Progress Notes (Signed)
  SUBJECTIVE:   CHIEF COMPLAINT / HPI:   Lupus: Patient sees rheumatologist every 3 months.  Has an appointment coming up soon.  Patient is still taking her CellCept and Plaquenil.  She was recently started on Benlysta for flare.    MS: Patient has upcoming neurology appointment on 10/18/20.  No changes to her management and her last MRI was on 05/16/20 and did not reveal any new lesions.    Contraceptive management: Patient is due for Depo shot today. POCT Urine Pregnancy as patient is out of date for receiving.  She has voiced concerns of how long she has been on Depo-Provera (10 years).  She would like information of birth control options at this time.  She has not heard good stories about IUDs and has less interest in those at this time but is still amenable to information.  She has not considered a barrier methods we will discuss those with her partner.  PERTINENT  PMH / PSH: Lupus, MS, CIN3  OBJECTIVE:   BP 128/88   Pulse (!) 114   Ht 5\' 1"  (1.549 m)   Wt 205 lb 12.8 oz (93.4 kg)   SpO2 100%   BMI 38.89 kg/m    General: NAD, pleasant, able to participate in exam Cardiac: RRR, no murmurs. Respiratory: CTAB, normal effort, No wheezes or rhonchi   ASSESSMENT/PLAN:   Multiple sclerosis (HCC) Stable.  Managed by neurology.  Recent MRIs on 05/16/20 did not reveal any new lesions.  Currently on CellCept and Plaquenil.  SLE (systemic lupus erythematosus related syndrome) (HCC) Managed by rheumatology.  On CellCept and Plaquenil without adverse reactions.  Recently started on Benlysta due to flare.  We will continue to follow with rheumatology.  07/13/20: CBC nonconcerning as it is suggestive of pancytopenia as expected by medications.  CMP unremarkable.  Complement C3 and C4 unremarkable.  Hepatitis B and C testing nonreactive.  Contraceptive management POCT urine pregnancy negative.  Received Depo-Provera today.  Patient has been receiving Depo-Provera for 10 years.  I have voiced  concerns of bone loss over time and advised change in contraceptive management in accordance with patient's wariness of long-term use of Depo-Provera.  Provided contraceptive method information packet for patient to review and return with questions during next visit in 3 months.    09/12/20, DO [facility]

## 2020-09-27 ENCOUNTER — Ambulatory Visit: Payer: 59 | Admitting: Student

## 2020-09-27 ENCOUNTER — Encounter: Payer: Self-pay | Admitting: Student

## 2020-09-27 ENCOUNTER — Other Ambulatory Visit: Payer: Self-pay

## 2020-09-27 VITALS — BP 128/88 | HR 114 | Ht 61.0 in | Wt 205.8 lb

## 2020-09-27 DIAGNOSIS — G35 Multiple sclerosis: Secondary | ICD-10-CM | POA: Diagnosis not present

## 2020-09-27 DIAGNOSIS — M329 Systemic lupus erythematosus, unspecified: Secondary | ICD-10-CM | POA: Diagnosis not present

## 2020-09-27 DIAGNOSIS — Z3042 Encounter for surveillance of injectable contraceptive: Secondary | ICD-10-CM | POA: Diagnosis not present

## 2020-09-27 LAB — POCT URINE PREGNANCY: Preg Test, Ur: NEGATIVE

## 2020-09-27 MED ORDER — MEDROXYPROGESTERONE ACETATE 150 MG/ML IM SUSP
150.0000 mg | Freq: Once | INTRAMUSCULAR | Status: AC
  Administered 2020-09-27: 150 mg via INTRAMUSCULAR

## 2020-09-27 NOTE — Progress Notes (Signed)
Patient here today for Depo Provera injection and is not within her dates.  Pregnancy test performed.    Last contraceptive appt was 06/20/2020.  Advised that next appt will need to be with provider.   Depo given in RUOQ today.  Site unremarkable & patient tolerated injection.     Next injection due 12/13/20 - 12/27/20.  Reminder card given.

## 2020-10-18 ENCOUNTER — Ambulatory Visit: Payer: 59 | Admitting: Neurology

## 2020-12-02 IMAGING — CR CHEST - 2 VIEW
2 series · 2 of 2 positions shown · non-contrast
Comparison: July 16, 2018

CLINICAL DATA: Fever and malaise

EXAM:
CHEST - 2 VIEW

[w chest lat]
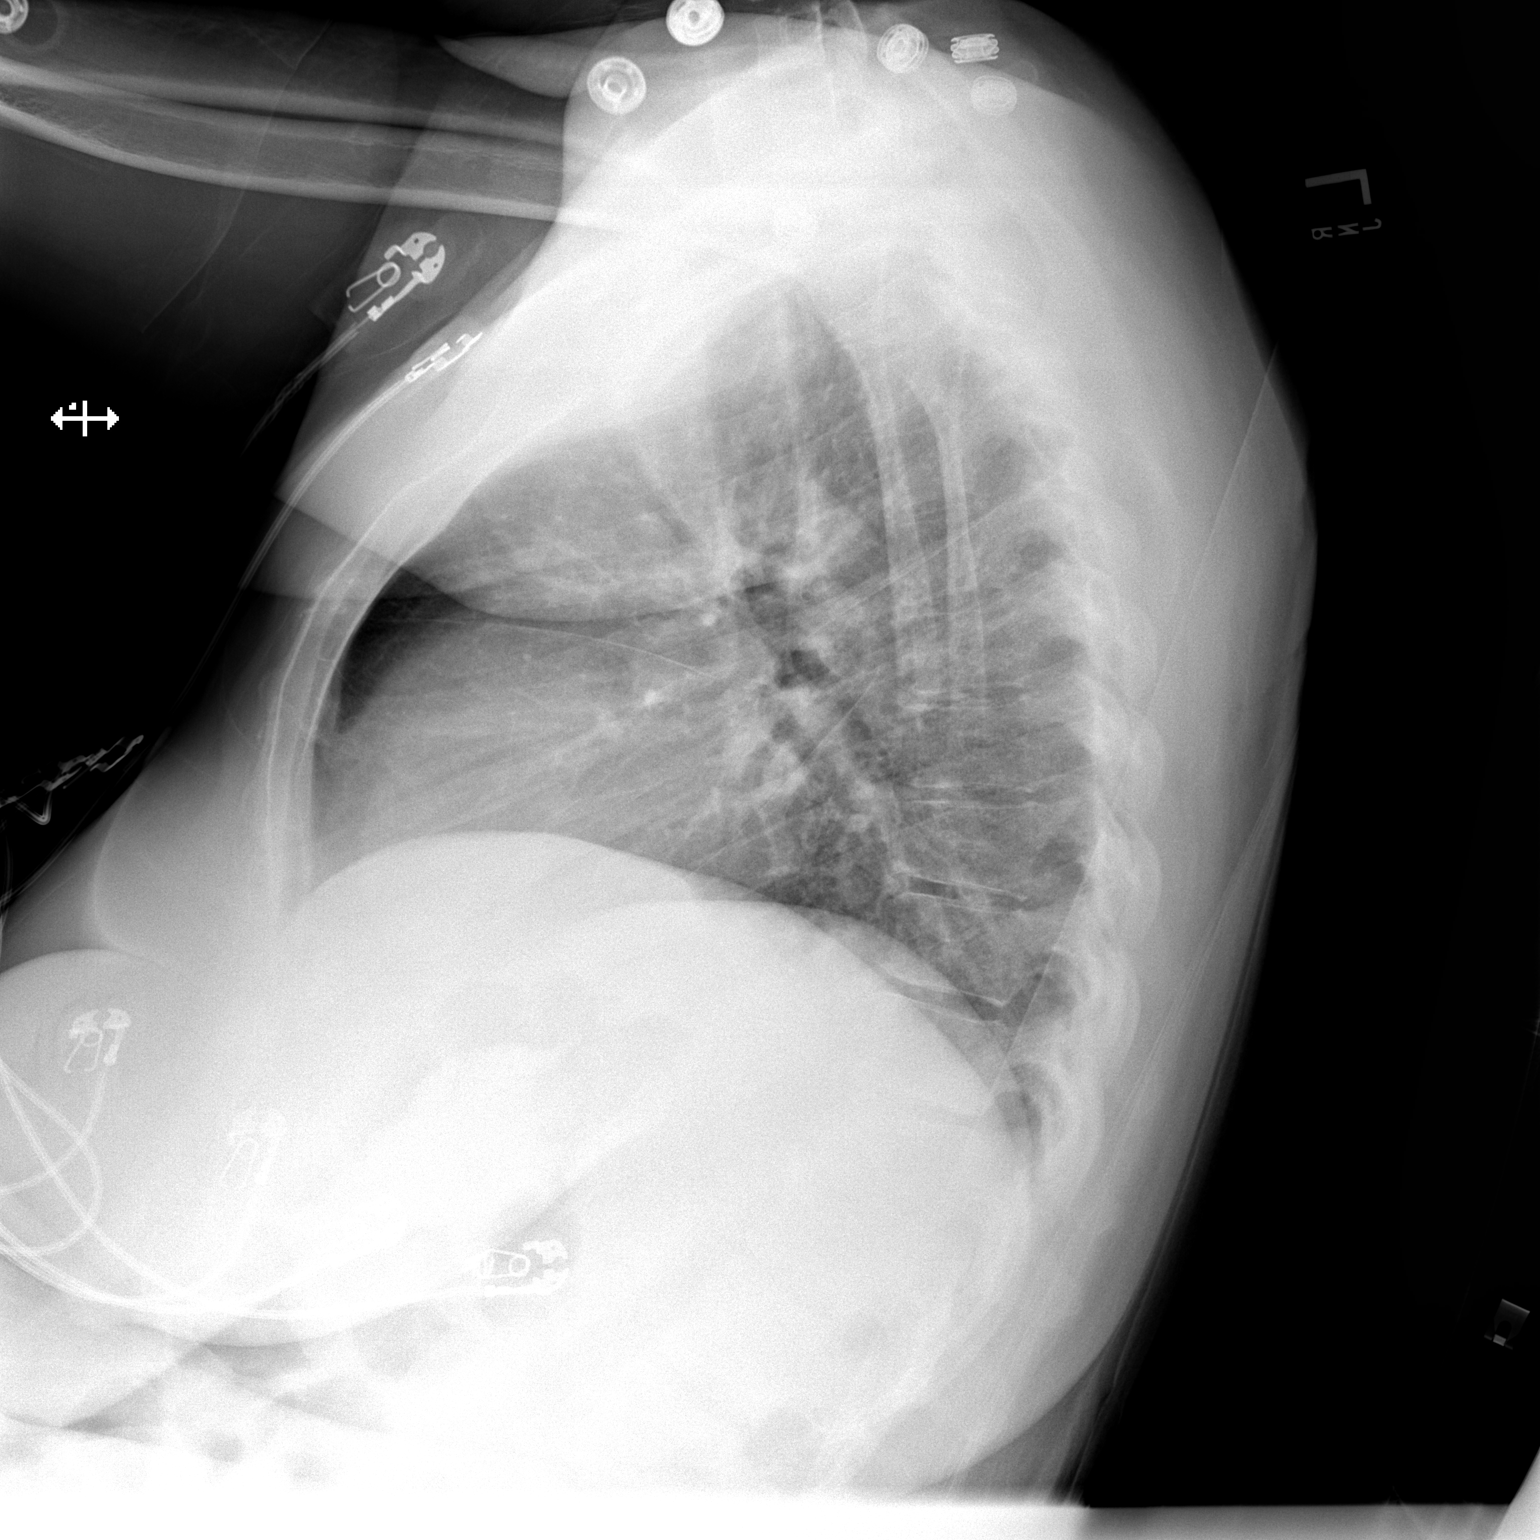

[x chest ap]
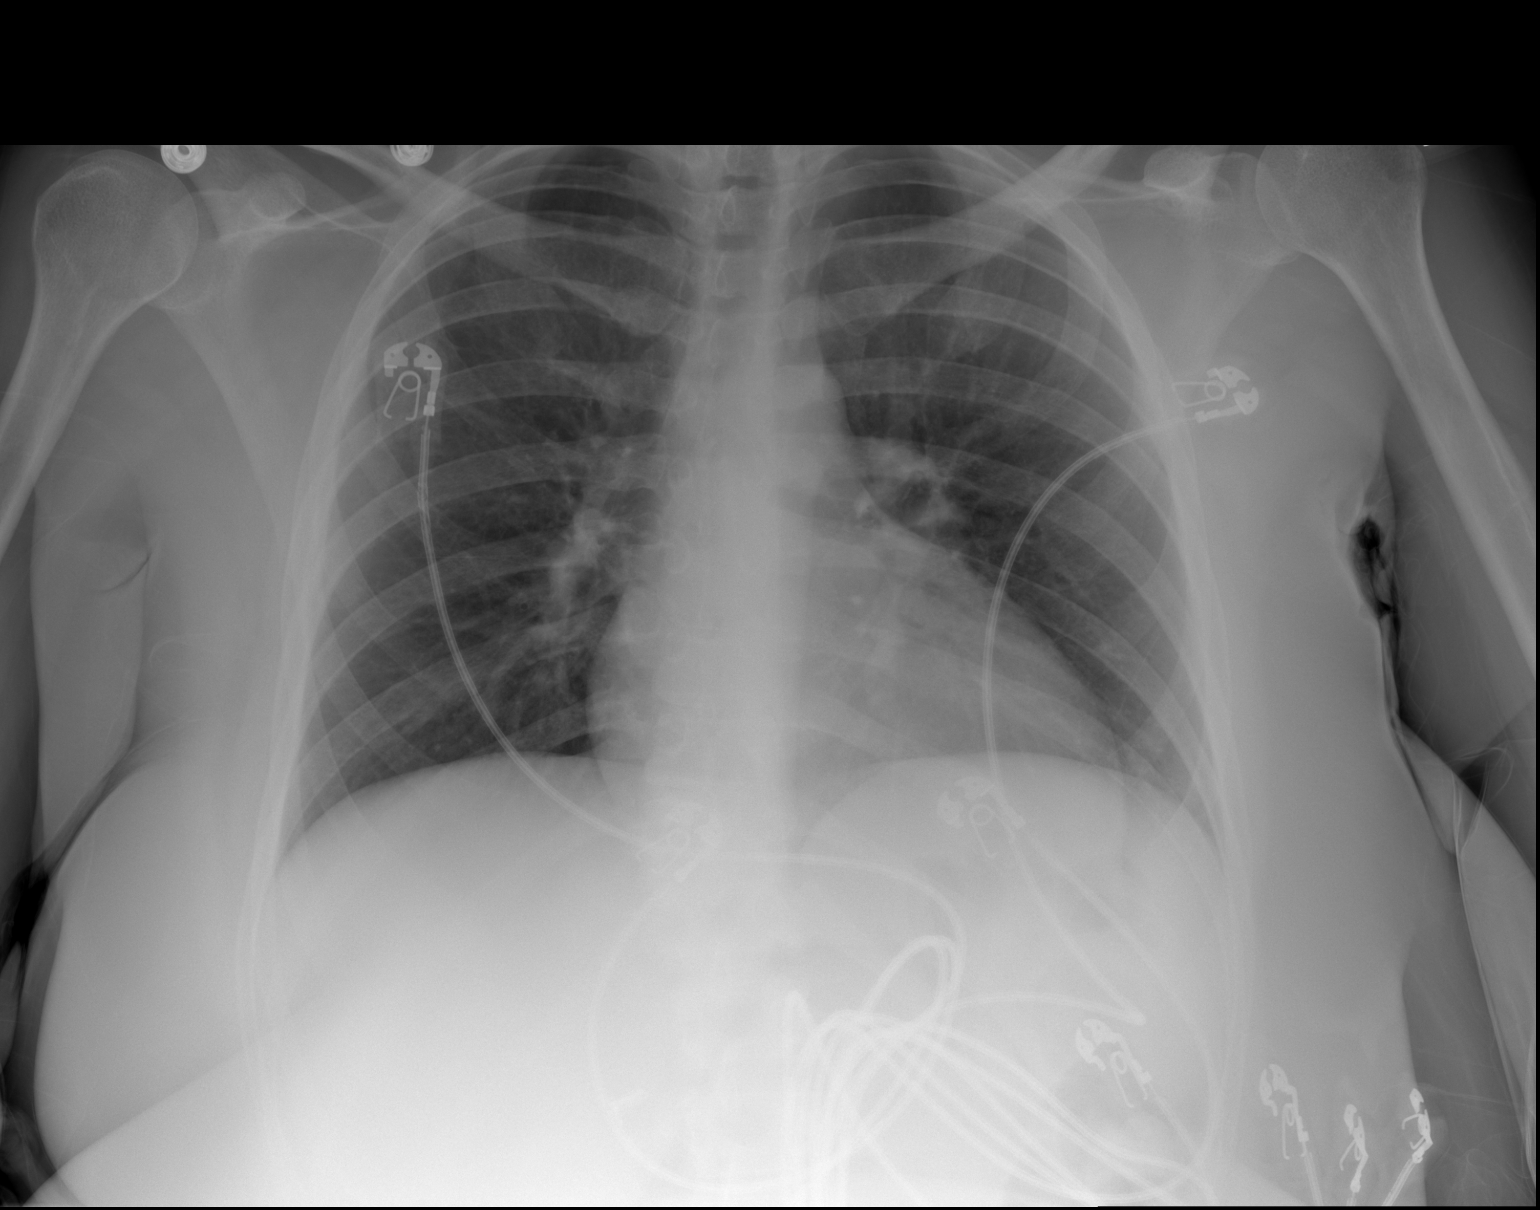

[2 of 2 positions shown; findings below may reference images not displayed]

FINDINGS: Lungs are clear. The heart size and pulmonary vascularity are
normal. No adenopathy. No bone lesions.
IMPRESSION: No edema or consolidation.

## 2020-12-02 IMAGING — MR MRI HEAD WITHOUT CONTRAST
11 of 13 series · 41 of 48 positions shown · non-contrast
Comparison: Brain MRI 03/24/2018

CLINICAL DATA: Encephalopathy with hallucinations

EXAM:
MRI HEAD WITHOUT CONTRAST
TECHNIQUE: Multiplanar, multiecho pulse sequences of the brain and surrounding
structures were obtained without intravenous contrast.

[Series 5: DWI · axial · 3.0mm · 1.09mm/px · z∈[-41,+94]mm · 7 of 94 slices shown (1 of 6)]
[im 1/94]
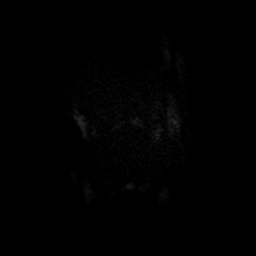
[im 16/94]
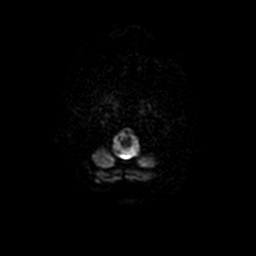
[im 32/94]
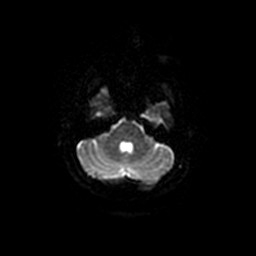
[im 47/94]
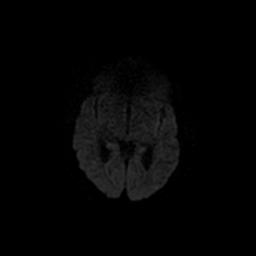
[im 63/94]
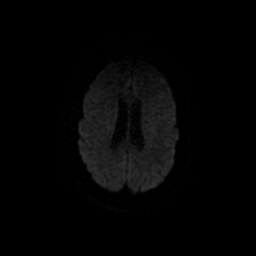
[im 78/94]
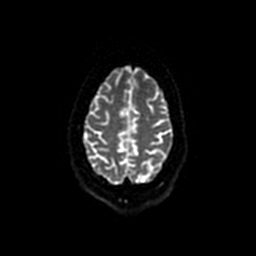
[im 94/94]
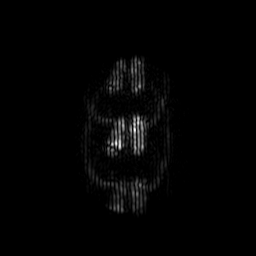

[Series 6: T1 · sagittal · 5.0mm · 0.47mm/px · 2 of 22 slices shown (1 of 2)]
[im 1/22]
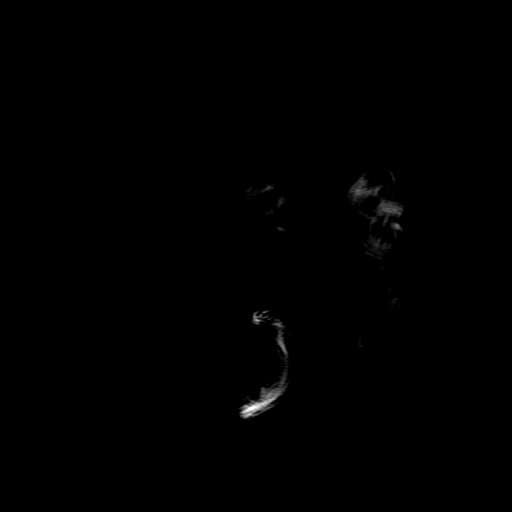
[im 22/22]
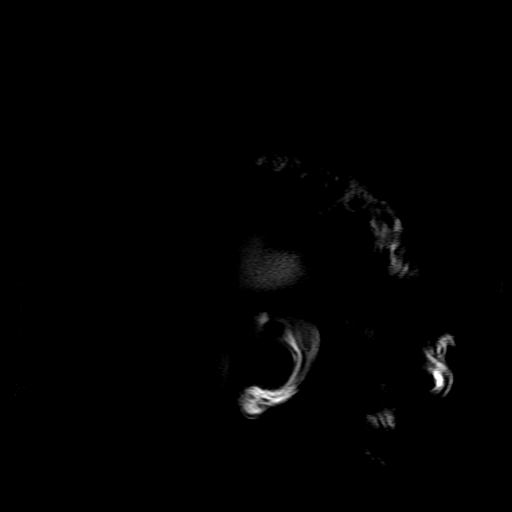

[Series 7: DWI · axial · 3.0mm · 1.09mm/px · z∈[-41,+94]mm · 7 of 94 slices shown (2 of 6)]
[im 1/94]
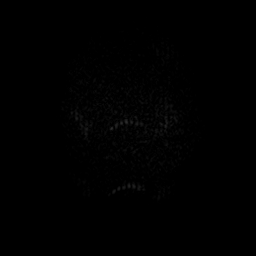
[im 16/94]
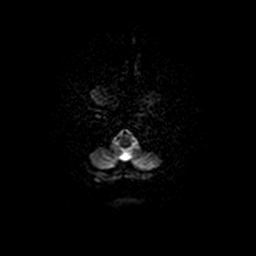
[im 32/94]
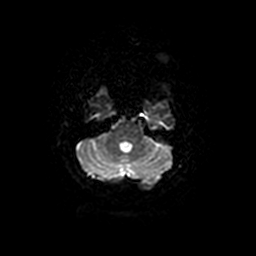
[im 47/94]
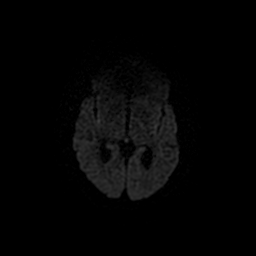
[im 63/94]
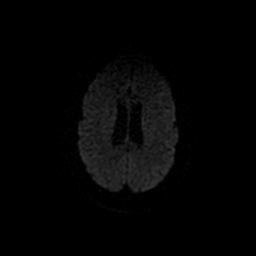
[im 78/94]
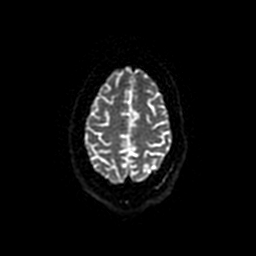
[im 94/94]
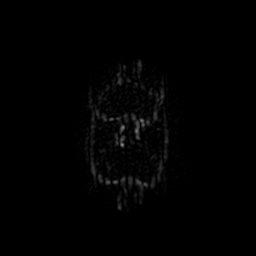

[Series 8: T2 · axial · 5.0mm · 0.47mm/px · z∈[-58,+93]mm · 2 of 27 slices shown (1 of 2)]
[im 1/27]
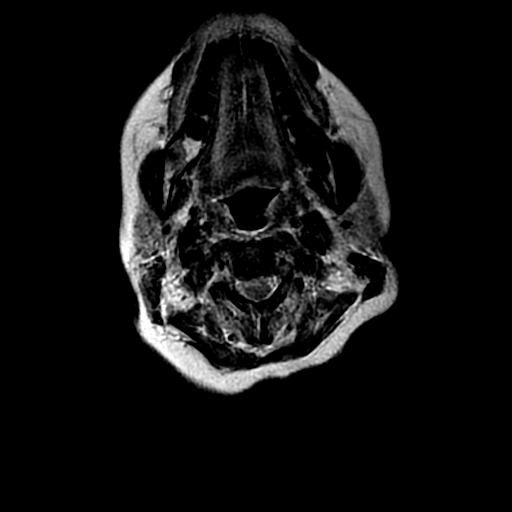
[im 27/27]
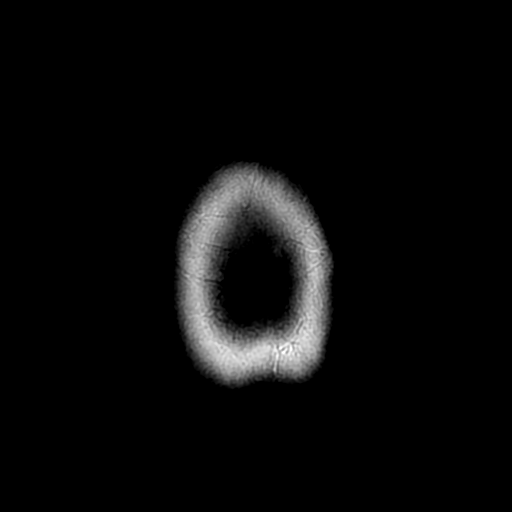

[Series 9: FLAIR · axial · 3.0mm · 0.43mm/px · z∈[-49,+83]mm · 2 of 24 slices shown]
[im 1/24]
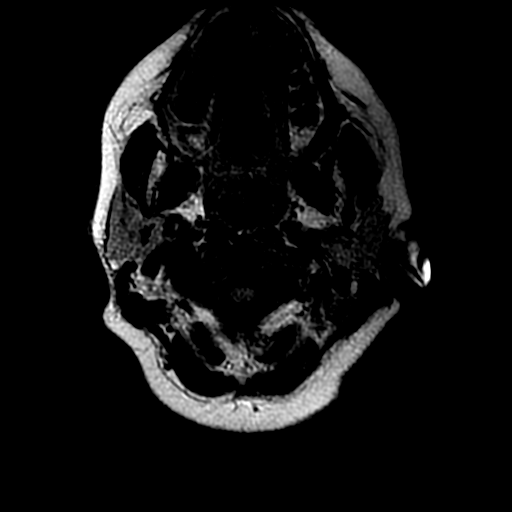
[im 24/24]
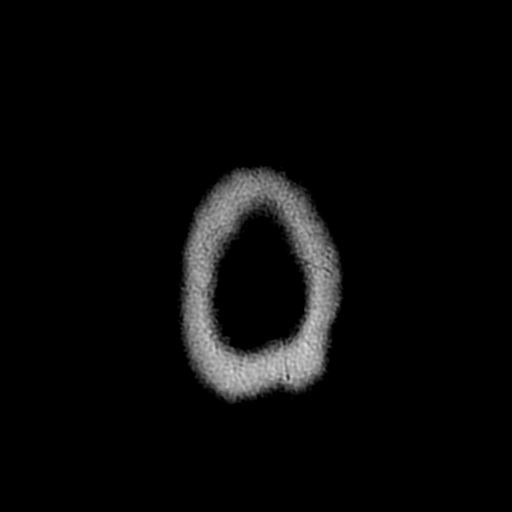

[Series 11: T1 · axial · 1.8mm · 0.47mm/px · z∈[-57,-31]mm · 2 of 78 slices shown (2 of 2)]
[im 1/78]
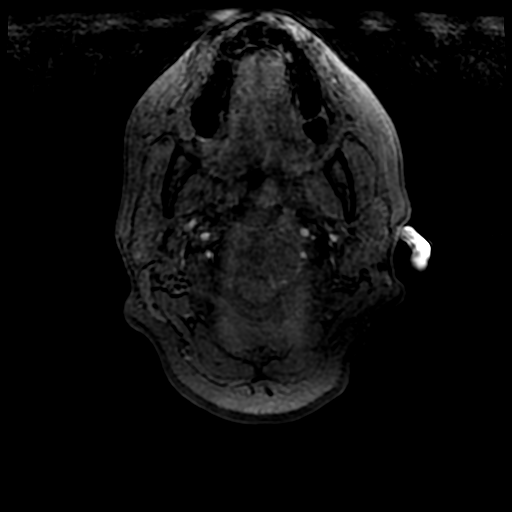
[im 16/78]
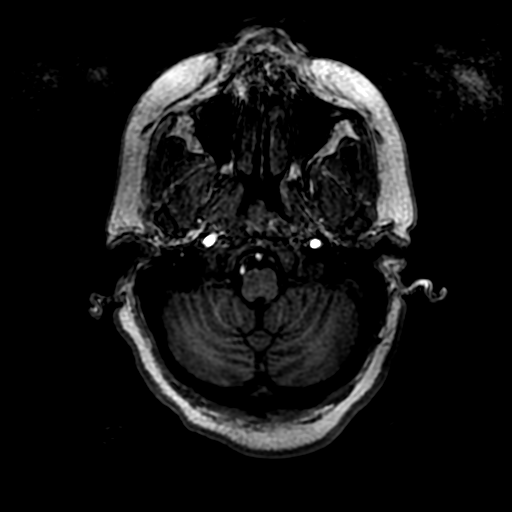

[Series 12: DWI · coronal · 4.0mm · 1.09mm/px · 6 of 74 slices shown (3 of 6)]
[im 1/74]
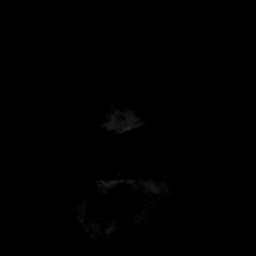
[im 15/74]
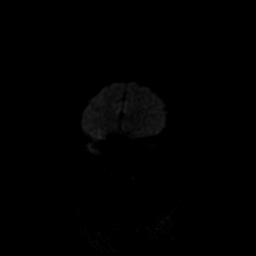
[im 30/74]
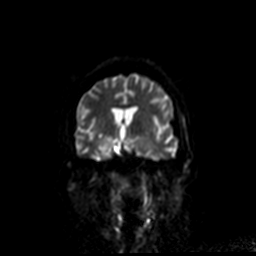
[im 44/74]
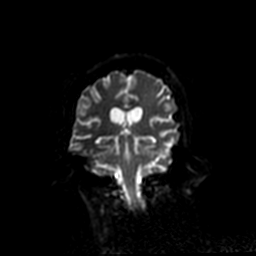
[im 59/74]
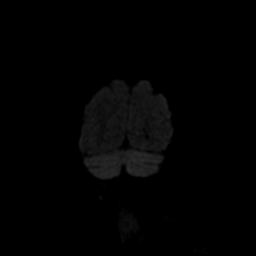
[im 74/74]
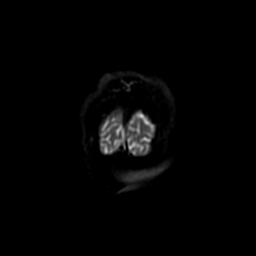

[Series 14: T2 · coronal · 5.0mm · 0.47mm/px · 2 of 24 slices shown (2 of 2)]
[im 1/24]
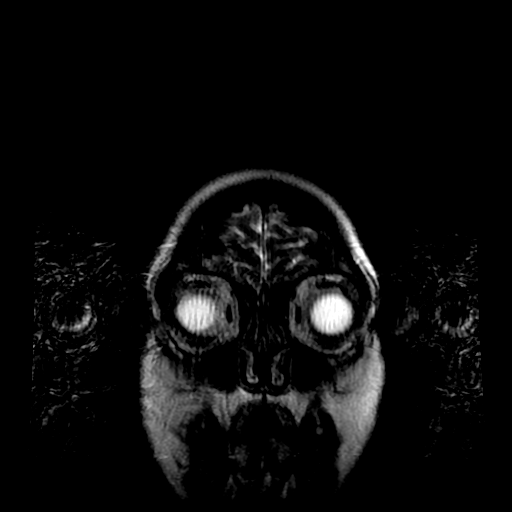
[im 24/24]
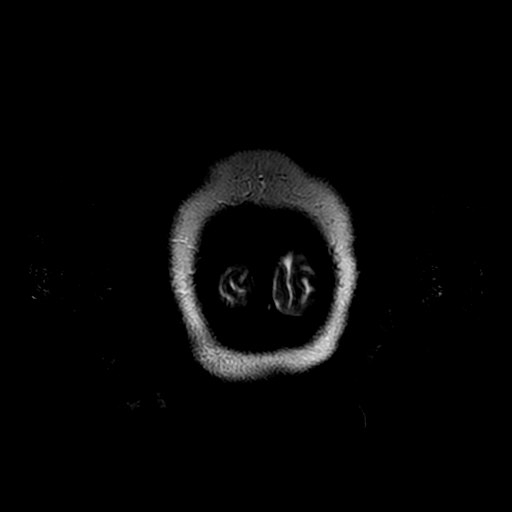

[Series 500: DWI · axial · 3.0mm · 1.09mm/px · z∈[-41,+94]mm · 4 of 47 slices shown (4 of 6)]
[im 1/47]
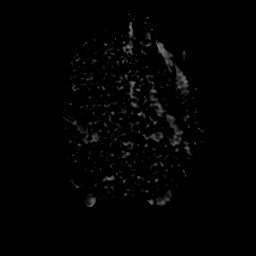
[im 16/47]
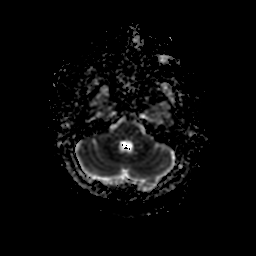
[im 31/47]
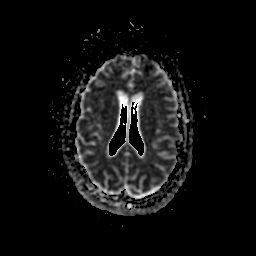
[im 47/47]
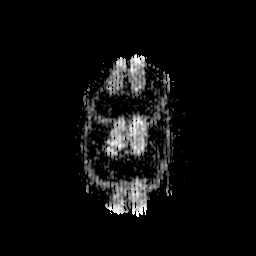

[Series 700: DWI · axial · 3.0mm · 1.09mm/px · z∈[-41,+94]mm · 4 of 47 slices shown (5 of 6)]
[im 1/47]
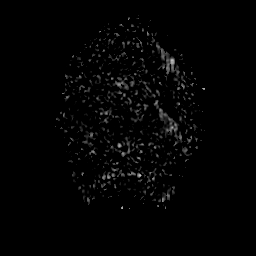
[im 16/47]
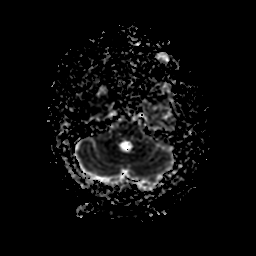
[im 31/47]
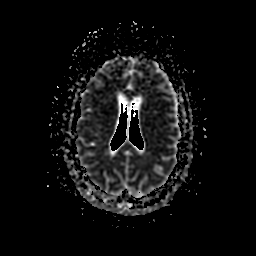
[im 47/47]
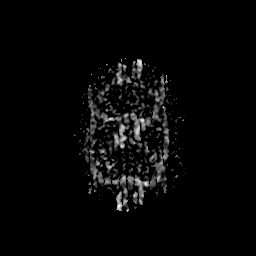

[Series 1200: DWI · coronal · 4.0mm · 1.09mm/px · 3 of 37 slices shown (6 of 6)]
[im 1/37]
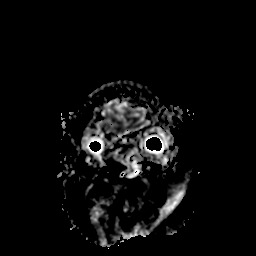
[im 19/37]
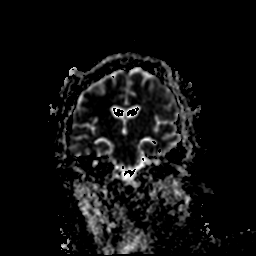
[im 37/37]
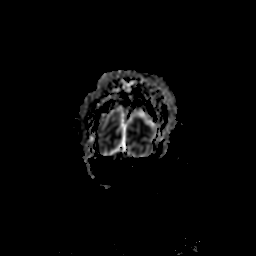

[41 of 48 positions shown; findings below may reference images not displayed]

FINDINGS: BRAIN: There is no acute infarct, acute hemorrhage or extra-axial
collection. The midline structures are normal. The white matter
signal is normal for the patient's age. The cerebral and cerebellar
volume are age-appropriate. No hydrocephalus.
Susceptibility-sensitive sequences show no chronic microhemorrhage
or superficial siderosis. No midline shift or other mass effect.

VASCULAR: The major intracranial arterial and venous sinus flow
voids are normal.

SKULL AND UPPER CERVICAL SPINE: Calvarial bone marrow signal is
normal. There is no skull base mass. Visualized upper cervical spine
and soft tissues are normal.

SINUSES/ORBITS: No fluid levels or advanced mucosal thickening. No
mastoid or middle ear effusion. The orbits are normal.
IMPRESSION: Normal brain.

## 2020-12-17 ENCOUNTER — Other Ambulatory Visit: Payer: Self-pay

## 2020-12-17 ENCOUNTER — Ambulatory Visit (INDEPENDENT_AMBULATORY_CARE_PROVIDER_SITE_OTHER): Payer: 59 | Admitting: Nurse Practitioner

## 2020-12-17 ENCOUNTER — Encounter: Payer: Self-pay | Admitting: Nurse Practitioner

## 2020-12-17 ENCOUNTER — Other Ambulatory Visit (HOSPITAL_COMMUNITY)
Admission: RE | Admit: 2020-12-17 | Discharge: 2020-12-17 | Disposition: A | Discharge status: Home / Self Care | Payer: 59 | Source: Ambulatory Visit | Attending: Nurse Practitioner | Admitting: Nurse Practitioner

## 2020-12-17 VITALS — BP 134/82 | Ht 62.5 in | Wt 192.0 lb

## 2020-12-17 DIAGNOSIS — Z3042 Encounter for surveillance of injectable contraceptive: Secondary | ICD-10-CM | POA: Diagnosis not present

## 2020-12-17 DIAGNOSIS — D069 Carcinoma in situ of cervix, unspecified: Secondary | ICD-10-CM | POA: Diagnosis present

## 2020-12-17 DIAGNOSIS — Z01419 Encounter for gynecological examination (general) (routine) without abnormal findings: Secondary | ICD-10-CM | POA: Diagnosis not present

## 2020-12-17 NOTE — Progress Notes (Signed)
   Emily Hicks 1985/11/02 [id number]   History:  35 y.o. G1P0001 presents for annual exam. Amenorrheic/Depo Provera. 01/16/2020 cold knife conization and endocervical curettage for severe cervical dysplasia/CIN 3/carcinoma in situ on biopsy. All margins negative for high-grade dysplasia, low-grade dysplasia present at ectocervical margin in all 4 quadrants, no evidence of invasive carcinoma. Repeat pap 08/08/2020 showed normal cytology positive HR HPV. History of Lupus, MS.   Gynecologic History No LMP recorded. Patient has had an injection.   Contraception/Family planning: Depo-Provera injections Sexually active: Yes  Health Maintenance Last Pap: 08/08/2020. Results were: Normal cytology positive HR HPV Last mammogram: Not indicated Last colonoscopy: Not indicated Last Dexa: Not indicated   Past medical history, past surgical history, family history and social history were all reviewed and documented in the EPIC chart. Married. Works at [employer] 56 yo son.   ROS:  A ROS was performed and pertinent positives and negatives are included.  Exam:  Vitals:   12/17/20 1154  BP: 134/82  Weight: 192 lb (87.1 kg)  Height: 5' 2.5" (1.588 m)   Body mass index is 34.56 kg/m.  General appearance:  Normal Thyroid:  Symmetrical, normal in size, without palpable masses or nodularity. Respiratory  Auscultation:  Clear without wheezing or rhonchi Cardiovascular  Auscultation:  Regular rate, without rubs, murmurs or gallops  Edema/varicosities:  Not grossly evident Abdominal  Soft,nontender, without masses, guarding or rebound.  Liver/spleen:  No organomegaly noted  Hernia:  None appreciated  Skin  Inspection:  Grossly normal Breasts: Examined lying and sitting.   Right: Without masses, retractions, nipple discharge or axillary adenopathy.   Left: Without masses, retractions, nipple discharge or axillary adenopathy. Genitourinary   Inguinal/mons:  Normal without inguinal  adenopathy  External genitalia:  Normal appearing vulva with no masses, tenderness, or lesions  BUS/Urethra/Skene's glands:  Normal  Vagina:  Normal appearing with normal color and discharge, no lesions  Cervix:  Normal appearing without discharge or lesions  Uterus:  Difficult to palpate but no gross masses or tenderness  Adnexa/parametria:     Rt: Normal in size, without masses or tenderness.   Lt: Normal in size, without masses or tenderness.  Anus and perineum: Normal  Patient informed chaperone available to be present for breast and pelvic exam. Patient has requested no chaperone to be present. Patient has been advised what will be completed during breast and pelvic exam.   Assessment/Plan:  35 y.o. G1P0001 for annual exam.   Well female exam with routine gynecological exam - Education provided on SBEs, importance of preventative screenings, current guidelines, high calcium diet, regular exercise, and multivitamin daily. Labs done elsewhere.   High grade squamous intraepithelial lesion (HGSIL), grade 3 CIN, on biopsy of cervix - Plan: Cytology - PAP( [place]). 01/16/2020 cold knife conization and endocervical curettage for severe cervical dysplasia/CIN 3/carcinoma in situ on biopsy. All margins negative for high-grade dysplasia, low-grade dysplasia present at ectocervical margin in all 4 quadrants, no evidence of invasive carcinoma. Repeat pap 08/08/2020 showed normal cytology positive HR HPV. Pap with HR HPV today.   Encounter for surveillance of injectable contraceptive - Depo Provera provided by PCP's office. Most recent 09/27/2020. No concerns.   Return in 1 year for annual.   Olivia Mackie DNP, 12:20 PM 12/17/2020

## 2020-12-20 ENCOUNTER — Other Ambulatory Visit: Payer: Self-pay

## 2020-12-20 ENCOUNTER — Ambulatory Visit (INDEPENDENT_AMBULATORY_CARE_PROVIDER_SITE_OTHER): Payer: 59

## 2020-12-20 DIAGNOSIS — Z30019 Encounter for initial prescription of contraceptives, unspecified: Secondary | ICD-10-CM

## 2020-12-20 LAB — CYTOLOGY - PAP
Comment: NEGATIVE
Diagnosis: NEGATIVE
High risk HPV: NEGATIVE

## 2020-12-20 MED ORDER — MEDROXYPROGESTERONE ACETATE 150 MG/ML IM SUSP
150.0000 mg | Freq: Once | INTRAMUSCULAR | Status: AC
Start: 2020-12-20 — End: 2020-12-20
  Administered 2020-12-20: 15:00:00 150 mg via INTRAMUSCULAR

## 2020-12-20 NOTE — Progress Notes (Signed)
Patient here today for Depo Provera injection and is within her dates.   Last contraceptive appt was 09/27/2020.   Depo given in LUOQ today. Site unremarkable & patient tolerated injection.     Next injection due 03/07/2021-03/21/2021. Reminder card given.

## 2021-03-15 ENCOUNTER — Other Ambulatory Visit: Payer: Self-pay | Admitting: Student

## 2021-03-18 ENCOUNTER — Other Ambulatory Visit: Payer: Self-pay

## 2021-03-18 ENCOUNTER — Ambulatory Visit (INDEPENDENT_AMBULATORY_CARE_PROVIDER_SITE_OTHER): Payer: 59

## 2021-03-18 DIAGNOSIS — Z3042 Encounter for surveillance of injectable contraceptive: Secondary | ICD-10-CM

## 2021-03-18 NOTE — Progress Notes (Signed)
Patient here today for Depo Provera injection and is within her dates.    Last contraceptive appt was 09/27/2020  Depo given in RUOQ today.  Site unremarkable & patient tolerated injection.    Next injection due 06/03/21-06/17/21.  Reminder card given.    Veronda Prude, RN

## 2021-04-20 ENCOUNTER — Emergency Department (HOSPITAL_COMMUNITY): Payer: 59

## 2021-04-20 ENCOUNTER — Other Ambulatory Visit: Payer: Self-pay

## 2021-04-20 ENCOUNTER — Encounter (HOSPITAL_COMMUNITY): Payer: Self-pay

## 2021-04-20 ENCOUNTER — Emergency Department (HOSPITAL_COMMUNITY)
Admission: EM | Admit: 2021-04-20 | Discharge: 2021-04-20 | Disposition: A | Discharge status: Home / Self Care | Payer: 59 | Attending: Emergency Medicine | Admitting: Emergency Medicine

## 2021-04-20 DIAGNOSIS — I10 Essential (primary) hypertension: Secondary | ICD-10-CM | POA: Diagnosis not present

## 2021-04-20 DIAGNOSIS — R000 Tachycardia, unspecified: Secondary | ICD-10-CM

## 2021-04-20 DIAGNOSIS — Z79899 Other long term (current) drug therapy: Secondary | ICD-10-CM | POA: Diagnosis not present

## 2021-04-20 DIAGNOSIS — R002 Palpitations: Secondary | ICD-10-CM

## 2021-04-20 DIAGNOSIS — Z20822 Contact with and (suspected) exposure to covid-19: Secondary | ICD-10-CM | POA: Insufficient documentation

## 2021-04-20 LAB — CBC WITH DIFFERENTIAL/PLATELET
Abs Immature Granulocytes: 0.07 K/uL (ref 0.00–0.07)
Basophils Absolute: 0 K/uL (ref 0.0–0.1)
Basophils Relative: 0 %
Eosinophils Absolute: 0 K/uL (ref 0.0–0.5)
Eosinophils Relative: 0 %
HCT: 32.2 % — ABNORMAL LOW (ref 36.0–46.0)
Hemoglobin: 10 g/dL — ABNORMAL LOW (ref 12.0–15.0)
Immature Granulocytes: 2 %
Lymphocytes Relative: 19 %
Lymphs Abs: 0.9 K/uL (ref 0.7–4.0)
MCH: 26.9 pg (ref 26.0–34.0)
MCHC: 31.1 g/dL (ref 30.0–36.0)
MCV: 86.6 fL (ref 80.0–100.0)
Monocytes Absolute: 0.3 K/uL (ref 0.1–1.0)
Monocytes Relative: 6 %
Neutro Abs: 3.4 K/uL (ref 1.7–7.7)
Neutrophils Relative %: 73 %
Platelets: 318 K/uL (ref 150–400)
RBC: 3.72 MIL/uL — ABNORMAL LOW (ref 3.87–5.11)
RDW: 13.7 % (ref 11.5–15.5)
WBC: 4.7 K/uL (ref 4.0–10.5)
nRBC: 0 % (ref 0.0–0.2)

## 2021-04-20 LAB — LACTIC ACID, PLASMA
Lactic Acid, Venous: 0.8 mmol/L (ref 0.5–1.9)
Lactic Acid, Venous: 0.9 mmol/L (ref 0.5–1.9)

## 2021-04-20 LAB — TROPONIN I (HIGH SENSITIVITY)
Troponin I (High Sensitivity): 4 ng/L (ref <18)
Troponin I (High Sensitivity): 4 ng/L (ref <18)

## 2021-04-20 LAB — RESP PANEL BY RT-PCR (FLU A&B, COVID) ARPGX2
Influenza A by PCR: NEGATIVE
Influenza B by PCR: NEGATIVE
SARS Coronavirus 2 by RT PCR: NEGATIVE

## 2021-04-20 LAB — COMPREHENSIVE METABOLIC PANEL
ALT: 8 U/L (ref 0–44)
AST: 15 U/L (ref 15–41)
Albumin: 3.3 g/dL — ABNORMAL LOW (ref 3.5–5.0)
Alkaline Phosphatase: 48 U/L (ref 38–126)
Anion gap: 6 (ref 5–15)
BUN: 12 mg/dL (ref 6–20)
CO2: 24 mmol/L (ref 22–32)
Calcium: 8.4 mg/dL — ABNORMAL LOW (ref 8.9–10.3)
Chloride: 107 mmol/L (ref 98–111)
Creatinine, Ser: 0.61 mg/dL (ref 0.44–1.00)
GFR, Estimated: >60 mL/min (ref >60)
Glucose, Bld: 98 mg/dL (ref 70–99)
Potassium: 3.7 mmol/L (ref 3.5–5.1)
Sodium: 137 mmol/L (ref 135–145)
Total Bilirubin: 0.9 mg/dL (ref 0.3–1.2)
Total Protein: 6.9 g/dL (ref 6.5–8.1)

## 2021-04-20 LAB — I-STAT BETA HCG BLOOD, ED (MC, WL, AP ONLY): I-stat hCG, quantitative: <5 m[IU]/mL (ref <5)

## 2021-04-20 LAB — TSH: TSH: 1.29 u[IU]/mL (ref 0.350–4.500)

## 2021-04-20 MED ORDER — SODIUM CHLORIDE 0.9 % IV BOLUS
500.0000 mL | Freq: Once | INTRAVENOUS | Status: AC
  Administered 2021-04-20: 500 mL via INTRAVENOUS

## 2021-04-20 MED ORDER — IOHEXOL 350 MG/ML SOLN
75.0000 mL | Freq: Once | INTRAVENOUS | Status: AC | PRN
  Administered 2021-04-20: 75 mL via INTRAVENOUS

## 2021-04-20 MED ORDER — SODIUM CHLORIDE 0.9 % IV BOLUS
1000.0000 mL | Freq: Once | INTRAVENOUS | Status: AC
  Administered 2021-04-20: 1000 mL via INTRAVENOUS

## 2021-04-20 NOTE — ED Triage Notes (Signed)
Pt states she has been tachycardic since last night. Pt states she was sitting around watching TV when it started. Pt states she has lupus and has had swelling in her feet.  ?

## 2021-04-20 NOTE — ED Provider Notes (Signed)
?[facility]-EMERGENCY DEPT ?Provider Note ? ? ?CSN: [id number] ?Arrival date & time: 04/20/21  1831 ? ?  ? ?History ? ?Chief Complaint  ?Patient presents with  ? Tachycardia  ? ? ?Emily Hicks is a 36 y.o. female. ? ?36 year old female with prior medical history as detailed below presents for evaluation.  Patient reports that starting yesterday evening she developed fast heart rate.  She felt like her heart was racing.  She describes some mild shortness of breath.  She reports diffuse left-sided chest discomfort.  This discomfort has been present for greater than 18 hours. ? ?She denies fever.  She denies nausea or vomiting.  She denies abdominal pain.  She denies recent illness. ? ?Prior medical history is significant for lupus.  She is fairly well controlled on her lupus with CellCept and Plaquenil and prednisone. ? ?She denies prior history of DVT or PE.  She denies prior history of ACS. ? ?The history is provided by the patient and medical records.  ?Illness ?Location:  Palpitations, tachycardia, hypertension, chest discomfort, dyspnea ?Severity:  Moderate ?Onset quality:  Gradual ?Duration:  1 day ?Timing:  Rare ?Progression:  Waxing and waning ?Chronicity:  New ? ?  ? ?Home Medications ?Prior to Admission medications   ?Medication Sig Start Date End Date Taking? Authorizing Provider  ?amLODipine (NORVASC) 5 MG tablet Take 5 mg by mouth daily. 03/07/21  Yes [provider]  ?diphenhydrAMINE (BENADRYL) 25 mg capsule Take 25 mg by mouth every 6 (six) hours as needed for allergies.   Yes [provider]  ?hydrocortisone 2.5 % ointment Apply 1 application. topically daily as needed (rash).   Yes [provider]  ?hydroxychloroquine (PLAQUENIL) 200 MG tablet Take 400 mg by mouth daily.   Yes [provider]  ?ibuprofen (ADVIL) 600 MG tablet Take 600 mg by mouth every 6 (six) hours as needed for moderate pain.   Yes [provider]  ?loratadine (CLARITIN)  10 MG tablet Take 10 mg by mouth daily as needed for allergies.   Yes [provider]  ?mycophenolate (CELLCEPT) 500 MG tablet Take 500 mg by mouth 2 (two) times daily.   Yes [provider]  ?predniSONE (DELTASONE) 5 MG tablet Take 5-30 mg by mouth as directed. Take 6 tablets (30 mg) X 3 Days, Take 5 tablets (25 mg) X 3 Days, Take 4 tablets (20 mg) X 3 Days, Take 3 tablets (15 mg) X 3 Days, Take 2 tablets (10 mg) X 3 Days, Take 1 tablet (5 mg) X 3 Days 12/05/20  Yes [provider]  ?   ? ?Allergies    ?Patient has no known allergies.   ? ?Review of Systems   ?Review of Systems  ?All other systems reviewed and are negative. ? ?Physical Exam ?Updated Vital Signs ?BP (!) 154/105   Pulse (!) 106   Temp 99.5 ?F (37.5 ?C) (Oral)   Resp 14   Ht 5\' 1"  (1.549 m)   Wt 83.5 kg   SpO2 99% Comment: Simultaneous filing. User may not have seen previous data.  BMI 34.77 kg/m?  ?Physical Exam ?Vitals and nursing note reviewed.  ?Constitutional:   ?   General: She is not in acute distress. ?   Appearance: Normal appearance. She is well-developed.  ?HENT:  ?   Head: Normocephalic and atraumatic.  ?Eyes:  ?   Conjunctiva/sclera: Conjunctivae normal.  ?   Pupils: Pupils are equal, round, and reactive to light.  ?Cardiovascular:  ?  Rate and Rhythm: Regular rhythm. Tachycardia present.  ?   Heart sounds: Normal heart sounds.  ?Pulmonary:  ?   Effort: Pulmonary effort is normal. No respiratory distress.  ?   Breath sounds: Normal breath sounds.  ?Abdominal:  ?   General: There is no distension.  ?   Palpations: Abdomen is soft.  ?   Tenderness: There is no abdominal tenderness.  ?Musculoskeletal:     ?   General: No deformity. Normal range of motion.  ?   Cervical back: Normal range of motion and neck supple.  ?Skin: ?   General: Skin is warm and dry.  ?Neurological:  ?   General: No focal deficit present.  ?   Mental Status: She is alert and oriented to person, place, and time.  ? ? ?ED Results /  Procedures / Treatments   ?Labs ?(all labs ordered are listed, but only abnormal results are displayed) ?Labs Reviewed  ?COMPREHENSIVE METABOLIC PANEL - Abnormal; Notable for the following components:  ?    Result Value  ? Calcium 8.4 (*)   ? Albumin 3.3 (*)   ? All other components within normal limits  ?CBC WITH DIFFERENTIAL/PLATELET - Abnormal; Notable for the following components:  ? RBC 3.72 (*)   ? Hemoglobin 10.0 (*)   ? HCT 32.2 (*)   ? All other components within normal limits  ?RESP PANEL BY RT-PCR (FLU A&B, COVID) [id number]  ?CULTURE, BLOOD (ROUTINE X 2)  ?CULTURE, BLOOD (ROUTINE X 2)  ?LACTIC ACID, PLASMA  ?LACTIC ACID, PLASMA  ?TSH  ?I-STAT BETA HCG BLOOD, ED ([facility], [facility], [facility] ONLY)  ?TROPONIN I (HIGH SENSITIVITY)  ?TROPONIN I (HIGH SENSITIVITY)  ? ? ?EKG ?EKG Interpretation ? ?Date/Time:  Saturday April 20 2021 18:44:34 EDT ?Ventricular Rate:  124 ?PR Interval:  136 ?QRS Duration: 80 ?QT Interval:  303 ?QTC Calculation: 436 ?R Axis:   74 ?Text Interpretation: Sinus tachycardia Confirmed by Kristine Royal [phone]) on 04/20/2021 6:52:48 PM ? ?Radiology ?CT Angio Chest PE W and/or Wo Contrast ? ?Result Date: 04/20/2021 ?CLINICAL DATA:  Pulmonary embolism (PE) suspected, unknown D-dimer. EXAM: CT ANGIOGRAPHY CHEST WITH CONTRAST TECHNIQUE: Multidetector CT imaging of the chest was performed using the standard protocol during bolus administration of intravenous contrast. Multiplanar CT image reconstructions and MIPs were obtained to evaluate the vascular anatomy. RADIATION DOSE REDUCTION: This exam was performed according to the departmental dose-optimization program which includes automated exposure control, adjustment of the mA and/or kV according to patient size and/or use of iterative reconstruction technique. CONTRAST:  18mL OMNIPAQUE IOHEXOL 350 MG/ML SOLN COMPARISON:  None. FINDINGS: Cardiovascular: Contrast injection is sufficient to demonstrate satisfactory opacification of the pulmonary arteries to the  segmental level. There is no pulmonary embolus or evidence of right heart strain. The size of the main pulmonary artery is normal. Heart size is normal, with no pericardial effusion. The course and caliber of the aorta are normal. There is no atherosclerotic calcification. Opacification decreased due to pulmonary arterial phase contrast bolus timing. Mediastinum/Nodes: No mediastinal, hilar or axillary lymphadenopathy. Normal visualized thyroid. Thoracic esophageal course is normal. Lungs/Pleura: Airways are patent. No pleural effusion, lobar consolidation, pneumothorax or pulmonary infarction. 3 mm right upper lobe pulmonary nodule is unchanged since 08/11/2018 and thus benign and does not require follow-up. Upper Abdomen: Contrast bolus timing is not optimized for evaluation of the abdominal organs. The visualized portions of the organs of the upper abdomen are normal. Musculoskeletal: No chest wall abnormality. No bony spinal canal stenosis. Unchanged T11  hemivertebra. Review of the MIP images confirms the above findings. IMPRESSION: No pulmonary embolus or other acute thoracic abnormality. Electronically Signed   By: Deatra Robinson M.D.   On: 04/20/2021 20:51  ? ?DG Chest Port 1 View ? ?Result Date: 04/20/2021 ?CLINICAL DATA:  Chest pain.  Tachycardia. EXAM: PORTABLE CHEST 1 VIEW COMPARISON:  08/05/2018 FINDINGS: Upper normal heart size.The mediastinal contours are normal. The lungs are clear. Pulmonary vasculature is normal. No consolidation, pleural effusion, or pneumothorax. No acute osseous abnormalities are seen. IMPRESSION: Upper normal heart size without acute pulmonary process. Electronically Signed   By: Narda Rutherford M.D.   On: 04/20/2021 19:21   ? ?Procedures ?Procedures  ? ? ?Medications Ordered in ED ?Medications  ?sodium chloride 0.9 % bolus 500 mL (has no administration in time range)  ?sodium chloride 0.9 % bolus 1,000 mL (0 mLs Intravenous Stopped 04/20/21 2110)  ?iohexol (OMNIPAQUE) 350 MG/ML  injection 75 mL (75 mLs Intravenous Contrast Given 04/20/21 2035)  ? ? ?ED Course/ Medical Decision Making/ A&P ?  ?                        ?Medical Decision Making ?Amount and/or Complexity of Data Reviewed ?Labs: order

## 2021-04-20 NOTE — Discharge Instructions (Signed)
Return for any problem.  Follow-up closely with your regular care provider as instructed. 

## 2021-04-26 LAB — CULTURE, BLOOD (ROUTINE X 2)
Culture: NO GROWTH
Culture: NO GROWTH
Special Requests: ADEQUATE
Special Requests: ADEQUATE

## 2021-05-20 ENCOUNTER — Ambulatory Visit: Payer: 59 | Admitting: Family Medicine

## 2021-05-20 ENCOUNTER — Emergency Department (HOSPITAL_COMMUNITY)
Admission: EM | Admit: 2021-05-20 | Discharge: 2021-05-20 | Discharge status: Against Medical Advice | Payer: 59 | Attending: Emergency Medicine | Admitting: Emergency Medicine

## 2021-05-20 ENCOUNTER — Emergency Department (HOSPITAL_COMMUNITY): Payer: 59

## 2021-05-20 ENCOUNTER — Encounter: Payer: Self-pay | Admitting: Family Medicine

## 2021-05-20 ENCOUNTER — Encounter (HOSPITAL_COMMUNITY): Payer: Self-pay

## 2021-05-20 ENCOUNTER — Other Ambulatory Visit: Payer: Self-pay

## 2021-05-20 VITALS — BP 144/91 | HR 111 | Wt 185.6 lb

## 2021-05-20 DIAGNOSIS — I10 Essential (primary) hypertension: Secondary | ICD-10-CM | POA: Insufficient documentation

## 2021-05-20 DIAGNOSIS — R000 Tachycardia, unspecified: Secondary | ICD-10-CM | POA: Insufficient documentation

## 2021-05-20 DIAGNOSIS — Z5321 Procedure and treatment not carried out due to patient leaving prior to being seen by health care provider: Secondary | ICD-10-CM | POA: Insufficient documentation

## 2021-05-20 DIAGNOSIS — R0602 Shortness of breath: Secondary | ICD-10-CM | POA: Insufficient documentation

## 2021-05-20 DIAGNOSIS — D649 Anemia, unspecified: Secondary | ICD-10-CM

## 2021-05-20 DIAGNOSIS — R030 Elevated blood-pressure reading, without diagnosis of hypertension: Secondary | ICD-10-CM | POA: Diagnosis not present

## 2021-05-20 DIAGNOSIS — R0789 Other chest pain: Secondary | ICD-10-CM | POA: Diagnosis present

## 2021-05-20 LAB — BASIC METABOLIC PANEL
Anion gap: 4 — ABNORMAL LOW (ref 5–15)
BUN: 9 mg/dL (ref 6–20)
CO2: 24 mmol/L (ref 22–32)
Calcium: 8.8 mg/dL — ABNORMAL LOW (ref 8.9–10.3)
Chloride: 111 mmol/L (ref 98–111)
Creatinine, Ser: 0.51 mg/dL (ref 0.44–1.00)
GFR, Estimated: >60 mL/min (ref >60)
Glucose, Bld: 92 mg/dL (ref 70–99)
Potassium: 3.2 mmol/L — ABNORMAL LOW (ref 3.5–5.1)
Sodium: 139 mmol/L (ref 135–145)

## 2021-05-20 LAB — CBC
HCT: 32.9 % — ABNORMAL LOW (ref 36.0–46.0)
Hemoglobin: 10.4 g/dL — ABNORMAL LOW (ref 12.0–15.0)
MCH: 27.6 pg (ref 26.0–34.0)
MCHC: 31.6 g/dL (ref 30.0–36.0)
MCV: 87.3 fL (ref 80.0–100.0)
Platelets: 299 10*3/uL (ref 150–400)
RBC: 3.77 MIL/uL — ABNORMAL LOW (ref 3.87–5.11)
RDW: 14.7 % (ref 11.5–15.5)
WBC: 6.8 10*3/uL (ref 4.0–10.5)
nRBC: 0 % (ref 0.0–0.2)

## 2021-05-20 LAB — D-DIMER, QUANTITATIVE: D-Dimer, Quant: 2.51 ug/mL-FEU — ABNORMAL HIGH (ref 0.00–0.50)

## 2021-05-20 LAB — TROPONIN I (HIGH SENSITIVITY): Troponin I (High Sensitivity): 4 ng/L (ref <18)

## 2021-05-20 MED ORDER — OLMESARTAN MEDOXOMIL 20 MG PO TABS: 20.0000 mg | ORAL_TABLET | Freq: Every day | ORAL | 3 refills | Status: DC

## 2021-05-20 NOTE — ED Provider Triage Note (Signed)
Emergency Medicine Provider Triage Evaluation Note ? ?Emily Hicks , a 36 y.o. female  was evaluated in triage.  Pt complains of hypertension and tachycardia and feeling "woozy" since Saturday.  Highest BP reading of 200/120.  Endorses accompanying mild shortness of breath and chest pressure over the last 24 hours.  Hx of SLE, anxiety, depression, multiple sclerosis.  Denies vision changes, fever, urinary symptoms, abdominal pain, chest pain, N/V, back pain. ? ?Review of Systems  ?Positive: As above ?Negative: As above ? ?Physical Exam  ?BP (!) 178/116 (BP Location: Left Arm)   Pulse (!) 103   Temp 98.2 ?F (36.8 ?C) (Oral)   Resp 11   SpO2 94%  ?Gen:   Awake, no distress   ?Resp:  Normal effort, CTAB ?MSK:   Moves extremities without difficulty  ?Other:  Chest non-TTP; Regular rhythm with rate of 108 w/o M/R/G ? ?Medical Decision Making  ?Medically screening exam initiated at 1:15 PM.  Appropriate orders placed.  ROTASHA GILL was informed that the remainder of the evaluation will be completed by another provider, this initial triage assessment does not replace that evaluation, and the importance of remaining in the ED until their evaluation is complete. ? ?Labs, imaging, EKG ordered ?  ?Prince Rome, PA-C ?[id number] ? ?

## 2021-05-20 NOTE — ED Triage Notes (Signed)
Pt arrived via POV, c/o tachycardia and HTN that she has been unable to get down.  ?

## 2021-05-20 NOTE — Patient Instructions (Addendum)
It was wonderful to see you today. ? ?Please bring ALL of your medications with you to every visit.  ? ?Today we talked about: ? ?-Your blood pressure today was 158/101. Repeat measurement was 144/91.   ?-Continue your Amlodipine 5 mg daily.  I am also sending a new medication called olmesartan.  Take this once a day along with your amlodipine. ?-You will need to return in 2 weeks for blood work so that we can check on your kidneys and electrolytes.  I have placed this order, I need to do is come into the clinic for lab only appointment in 2 weeks. ?-Take your blood pressure twice a day (morning and evening) and record in a log. Bring this to your follow up appointment.  ?-If you have chest pain, shortness of breath, vision troubles, worsening headache, please go back to the ED for further evaluation.  ? ? ? ?Thank you for choosing [facility].  ? ?Please call [phone] with any questions about today's appointment. ? ?Please be sure to schedule follow up at the front  desk before you leave today.  ? ?Sabino Dick, DO ?PGY-2 Family Medicine   ?

## 2021-05-20 NOTE — ED Notes (Signed)
Pt seen leaving facility, no answer when called. ?

## 2021-05-20 NOTE — Assessment & Plan Note (Signed)
Suspect underlying HTN given intermittent elevated readings throughout the years. Reassuringly, BP is much improved in the office and patient feels back to baseline. Reviewed ED workup, EKG sinus tachy without ST changes. CBC with mild anemia- but at baseline for her. D-dimer slightly elevated but low suspicion for PE. She is tachycardic but without SOB and no other risk factors and no previous history of PE. ?Plan:  ?-Continue Amlodipine 5 mg ?-START Olmesartan 20 mg daily ?-Patient instructed to take BP twice daily and record in log  ?-F/u with Dr. Valentina Lucks for ambulatory BP monitoring  ?-F/u in 2 weeks for lab only appointment for BMP ?-Strict return precautions provided.   ?

## 2021-05-20 NOTE — Assessment & Plan Note (Signed)
Reviewed CBC from ED today. Hgb stable. Denies any active bleeding. No further intervention at this time.  ?

## 2021-05-20 NOTE — Progress Notes (Signed)
? ? ?  SUBJECTIVE:  ? ?CHIEF COMPLAINT / HPI:  ? ?Elevated Blood Pressures ?Emily Hicks is a 36 y.o. female who presents to the [facility] clinic today to discuss elevated blood pressures. She initially presented to the ED for this but left due to wait time.  While in triage she did have some basic blood work done which included a D-dimer that was mildly elevated at 2.51 (elevated on the last two readings from 08/2018 and 07/2018 as well), initial troponin 4, Hgb 10.4 (seems to be her baseline), K 3.2 otherwise BMP unremarkable. current medications include amlodipine 5 mg daily. She takes plaquenil, prednisone and cellcept for her history of Lupus and MS.  ? ?She states that she felt "woozy" yesterday and took her BP, was 203 systolic. She took it again later that evening and it was 150s systolic. She went to the ED today because she felt like she couldn't get her blood pressures down herself. She has a history of fast heart rates, which is what she was told. Currently she feels back to normal but is feeling tired. She has been having flares of her Lupus and is following with her Rheumatologist- next appointment is May 3rd.  ? ?She was started on Amlodipine in February by her Rheumatologist for the Raynaud's. She has not taken her blood pressures today.  ? ?Has slight headache and reports swelling of ankles. Denies shortness of breath or chest pain.  ? ?Per chart review, she was seen in the ED in March for palpitations.  Had associated shortness of breath and left-sided chest discomfort with this.  She had a work-up including CT angio which was negative for PE or other significant pulmonary findings.  Her EKG revealed sinus tachycardia.  Initial blood pressures 170 systolic, improved with fluids.  ? ?PERTINENT  PMH / PSH: SLE, MS, pancytopenia  ? ?OBJECTIVE:  ? ?BP (!) 144/91   Pulse (!) 111   Wt 185 lb 9.6 oz (84.2 kg)   SpO2 100%   BMI 35.07 kg/m?   ? ?General: NAD, pleasant, able to participate in exam ?Cardiac:  Tachycardic, no murmurs. ?Respiratory: CTAB, normal effort, No wheezes, rales or rhonchi ?Extremities: no edema, right foot is in a boot ?Skin: warm and dry, no rashes noted ?Neuro: alert, awake, speech is clear, no focal deficits ?Psych: Normal affect and mood ? ?ASSESSMENT/PLAN:  ? ?Elevated blood pressure reading ?Suspect underlying HTN given intermittent elevated readings throughout the years. Reassuringly, BP is much improved in the office and patient feels back to baseline. Reviewed ED workup, EKG sinus tachy without ST changes. CBC with mild anemia- but at baseline for her. D-dimer slightly elevated but low suspicion for PE. She is tachycardic but without SOB and no other risk factors and no previous history of PE. ?Plan:  ?-Continue Amlodipine 5 mg ?-START Olmesartan 20 mg daily ?-Patient instructed to take BP twice daily and record in log  ?-F/u with Dr. Raymondo Band for ambulatory BP monitoring  ?-F/u in 2 weeks for lab only appointment for BMP ?-Strict return precautions provided.   ? ?Normocytic anemia ?Reviewed CBC from ED today. Hgb stable. Denies any active bleeding. No further intervention at this time.  ?  ? ? ?Sabino Dick, DO ?[facility]  ? ?

## 2021-05-22 ENCOUNTER — Emergency Department (HOSPITAL_COMMUNITY)
Admission: EM | Admit: 2021-05-22 | Discharge: 2021-05-22 | Disposition: A | Discharge status: Home / Self Care | Payer: 59 | Attending: Emergency Medicine | Admitting: Emergency Medicine

## 2021-05-22 ENCOUNTER — Other Ambulatory Visit: Payer: Self-pay

## 2021-05-22 ENCOUNTER — Encounter (HOSPITAL_COMMUNITY): Payer: Self-pay | Admitting: Emergency Medicine

## 2021-05-22 DIAGNOSIS — R000 Tachycardia, unspecified: Secondary | ICD-10-CM | POA: Diagnosis present

## 2021-05-22 DIAGNOSIS — Z79899 Other long term (current) drug therapy: Secondary | ICD-10-CM | POA: Insufficient documentation

## 2021-05-22 DIAGNOSIS — I10 Essential (primary) hypertension: Secondary | ICD-10-CM | POA: Insufficient documentation

## 2021-05-22 LAB — BASIC METABOLIC PANEL
Anion gap: 3 — ABNORMAL LOW (ref 5–15)
BUN: 14 mg/dL (ref 6–20)
CO2: 26 mmol/L (ref 22–32)
Calcium: 8.6 mg/dL — ABNORMAL LOW (ref 8.9–10.3)
Chloride: 110 mmol/L (ref 98–111)
Creatinine, Ser: 0.66 mg/dL (ref 0.44–1.00)
GFR, Estimated: >60 mL/min (ref >60)
Glucose, Bld: 90 mg/dL (ref 70–99)
Potassium: 3.6 mmol/L (ref 3.5–5.1)
Sodium: 139 mmol/L (ref 135–145)

## 2021-05-22 LAB — CBC
HCT: 34 % — ABNORMAL LOW (ref 36.0–46.0)
Hemoglobin: 10.7 g/dL — ABNORMAL LOW (ref 12.0–15.0)
MCH: 27.4 pg (ref 26.0–34.0)
MCHC: 31.5 g/dL (ref 30.0–36.0)
MCV: 87.2 fL (ref 80.0–100.0)
Platelets: 283 10*3/uL (ref 150–400)
RBC: 3.9 MIL/uL (ref 3.87–5.11)
RDW: 14.8 % (ref 11.5–15.5)
WBC: 5 10*3/uL (ref 4.0–10.5)
nRBC: 0 % (ref 0.0–0.2)

## 2021-05-22 MED ORDER — SODIUM CHLORIDE 0.9 % IV BOLUS
1000.0000 mL | Freq: Once | INTRAVENOUS | Status: AC
  Administered 2021-05-22: 1000 mL via INTRAVENOUS

## 2021-05-22 NOTE — ED Triage Notes (Signed)
Pt arrives POV w/ c/o hypertension and tachycardia since saturday. Pt also reports headache.  ?Hx lupus.  ?

## 2021-05-22 NOTE — ED Provider Notes (Signed)
?[facility]-EMERGENCY DEPT ?Provider Note ? ? ?CSN: [id number] ?Arrival date & time: 05/22/21  1141 ? ?  ? ?History ? ?Chief Complaint  ?Patient presents with  ? Hypertension  ? Headache  ? Tachycardia  ? ? ?Emily Hicks is a 36 y.o. female.  Patient presents to the emergency department complaining of tachycardia and hypertension.  Patient states that she woke up this morning feeling slightly woozy with a mild headache.  She states that she typically feels this way when her blood pressure is high.  She checked her blood pressure at home and noticed a diastolic value in the low 100 range.  The patient has lupus and has been dealing with blood pressure issues and tachycardia intermittently over the past month or so.  She currently endorses mild headache, intermittent lightheadedness.  Denies chest pain, denies shortness of breath, denies nausea.  The patient was evaluated for similar complaints on March 18 of this year in the emergency department.  At that visit she was complaining of tachycardia with some mild shortness of breath and diffuse left-sided chest discomfort.  She was supposed to begin a new infusion today, Saphnelo, for her lupus but came to the hospital due to the tachycardia.  She started olmesartan 20 mg Monday night this week and takes amlodipine 5 mg daily.  Past medical history significant for lupus, MS, GERD, gallstones. ? ?HPI ? ?  ? ?Home Medications ?Prior to Admission medications   ?Medication Sig Start Date End Date Taking? Authorizing Provider  ?amLODipine (NORVASC) 5 MG tablet Take 5 mg by mouth daily. 03/07/21   [provider]  ?diphenhydrAMINE (BENADRYL) 25 mg capsule Take 25 mg by mouth every 6 (six) hours as needed for allergies. ?Patient not taking: Reported on 05/20/2021    [provider]  ?hydrocortisone 2.5 % ointment Apply 1 application. topically daily as needed (rash). ?Patient not taking: Reported on 05/20/2021    [provider]   ?hydroxychloroquine (PLAQUENIL) 200 MG tablet Take 400 mg by mouth daily.    [provider]  ?ibuprofen (ADVIL) 600 MG tablet Take 600 mg by mouth every 6 (six) hours as needed for moderate pain.    [provider]  ?loratadine (CLARITIN) 10 MG tablet Take 10 mg by mouth daily as needed for allergies.    [provider]  ?mycophenolate (CELLCEPT) 500 MG tablet Take 500 mg by mouth 2 (two) times daily.    [provider]  ?olmesartan (BENICAR) 20 MG tablet Take 1 tablet (20 mg total) by mouth at bedtime. 05/20/21   Sabino Dick, DO  ?predniSONE (DELTASONE) 5 MG tablet Take 5-30 mg by mouth as directed. Take 6 tablets (30 mg) X 3 Days, Take 5 tablets (25 mg) X 3 Days, Take 4 tablets (20 mg) X 3 Days, Take 3 tablets (15 mg) X 3 Days, Take 2 tablets (10 mg) X 3 Days, Take 1 tablet (5 mg) X 3 Days 12/05/20   [provider]  ?   ? ?Allergies    ?Patient has no known allergies.   ? ?Review of Systems   ?Review of Systems  ?Constitutional:  Negative for fever.  ?Respiratory:  Negative for shortness of breath.   ?Cardiovascular:  Positive for palpitations (Complains of fast heart rate). Negative for chest pain.  ?Gastrointestinal:  Negative for abdominal pain and nausea.  ?Neurological:  Positive for light-headedness and headaches.  ? ?Physical Exam ?Updated Vital Signs ?BP (!) 144/99   Pulse (!) 111  Temp 98.2 ?F (36.8 ?C) (Oral)   Resp 19   SpO2 100%  ?Physical Exam ?Vitals reviewed.  ?Constitutional:   ?   General: She is not in acute distress. ?HENT:  ?   Head: Normocephalic.  ?Eyes:  ?   Extraocular Movements: Extraocular movements intact.  ?Cardiovascular:  ?   Rate and Rhythm: Regular rhythm. Tachycardia present.  ?   Heart sounds: Normal heart sounds.  ?Pulmonary:  ?   Effort: Pulmonary effort is normal.  ?   Breath sounds: Normal breath sounds.  ?Musculoskeletal:     ?   General: Normal range of motion.  ?   Cervical back: Normal range of motion.  ?Skin: ?    General: Skin is warm and dry.  ?Neurological:  ?   Mental Status: She is alert and oriented to person, place, and time.  ? ? ?ED Results / Procedures / Treatments   ?Labs ?(all labs ordered are listed, but only abnormal results are displayed) ?Labs Reviewed  ?BASIC METABOLIC PANEL - Abnormal; Notable for the following components:  ?    Result Value  ? Calcium 8.6 (*)   ? Anion gap 3 (*)   ? All other components within normal limits  ?CBC - Abnormal; Notable for the following components:  ? Hemoglobin 10.7 (*)   ? HCT 34.0 (*)   ? All other components within normal limits  ? ? ?EKG ?EKG Interpretation ? ?Date/Time:  Wednesday May 22 2021 13:25:38 EDT ?Ventricular Rate:  110 ?PR Interval:  145 ?QRS Duration: 79 ?QT Interval:  325 ?QTC Calculation: 440 ?R Axis:   73 ?Text Interpretation: Sinus tachycardia No acute changes No significant change since last tracing Confirmed by Derwood Kaplan [phone]) on 05/22/2021 1:26:58 PM ? ?Radiology ?No results found. ? ?Procedures ?Procedures  ? ? ?Medications Ordered in ED ?Medications  ?sodium chloride 0.9 % bolus 1,000 mL (1,000 mLs Intravenous New Bag/Given 05/22/21 1313)  ? ? ?ED Course/ Medical Decision Making/ A&P ?  ?                        ?Medical Decision Making ?Amount and/or Complexity of Data Reviewed ?Labs: ordered. ? ? ?This patient presents to the ED for concern of tachycardia, this involves an extensive number of treatment options, and is a complaint that carries with it a high risk of complications and morbidity.  The differential diagnosis includes dysrhythmia, pulmonary embolism, dehydration, and others ? ? ?Co morbidities that complicate the patient evaluation ? ?History of SLE ? ? ?Additional history obtained: ? ?External records from outside source obtained and reviewed including care everywhere notes summarizing patient's medical conditions ? ? ?Lab Tests: ? ?I Ordered, and personally interpreted labs.  The pertinent results include: CBC: Hemoglobin 10.7  slightly improved from last visit ? ?Cardiac Monitoring: / EKG: ? ?The patient was maintained on a cardiac monitor.  I personally viewed and interpreted the cardiac monitored which showed an underlying rhythm of: Sinus tachycardia, unchanged from last visit ? ?Problem List / ED Course / Critical interventions / Medication management ? ? ?I ordered medication including normal saline for possible dehydration ?Reevaluation of the patient after these medicines showed that the patient improved ?I have reviewed the patients home medicines and have made adjustments as needed ? ? ?Test / Admission - Considered: ? ?I had a lengthy discussion with the patient about expectations at today's visit.  Patient states that she is here for IV fluids, and is not  concerned about other possible etiologies of her tachycardia.  She was evaluated after emergency department visit on March 18 for pulmonary embolism, ACS, pneumonia, and findings were negative.  I see no reason today with similar symptoms in the patient's current complaints to repeat the same studies.  The patient states that her blood pressure and increased heart rate are side effect of her lupus progression.  She is currently followed by rheumatology and his primary care prescribing antihypertensive medications. ? ?I believe it is reasonable to finish IV fluids and discharge the patient home since she is feeling better.  The patient is appreciative of the plan and agrees with the plan.  Discharged home with return precaution ? ? ?Final Clinical Impression(s) / ED Diagnoses ?Final diagnoses:  ?Tachycardia  ?Hypertension, unspecified type  ? ? ?Rx / DC Orders ?ED Discharge Orders   ? ? None  ? ?  ? ? ?  ?Darrick Grinder, PA-C ?05/22/21 1421 ? ?  ?Derwood Kaplan, MD ?06/01/21 1427 ? ?

## 2021-05-22 NOTE — Discharge Instructions (Signed)
You were seen today for high blood pressure and tachycardia.  Your blood pressure was slightly elevated.  Recommend follow-up with primary care for further evaluation and management of fluctuating blood pressures.  You did have sinus tachycardia, slight increase in your base heart rate today.  This seems consistent with your previous visit.  Recommend discussion with rheumatology and primary care.  Return to the hospital if you develop chest pain, shortness of breath, or other life-threatening conditions ?

## 2021-05-22 NOTE — ED Notes (Signed)
I provided reinforced discharge education based off of discharge instructions. Pt acknowledged and understood my education. Pt had no further questions/concerns for provider/myself.  °

## 2021-06-10 ENCOUNTER — Ambulatory Visit: Payer: 59 | Admitting: Pharmacist

## 2021-06-13 ENCOUNTER — Encounter (HOSPITAL_COMMUNITY): Payer: Self-pay

## 2021-06-13 ENCOUNTER — Emergency Department (HOSPITAL_COMMUNITY): Payer: 59

## 2021-06-13 ENCOUNTER — Observation Stay (HOSPITAL_COMMUNITY)
Admission: EM | Admit: 2021-06-13 | Discharge: 2021-06-15 | Disposition: A | Discharge status: Home / Self Care | Payer: 59 | Attending: Internal Medicine | Admitting: Internal Medicine

## 2021-06-13 ENCOUNTER — Other Ambulatory Visit: Payer: Self-pay

## 2021-06-13 DIAGNOSIS — R700 Elevated erythrocyte sedimentation rate: Secondary | ICD-10-CM | POA: Insufficient documentation

## 2021-06-13 DIAGNOSIS — I10 Essential (primary) hypertension: Secondary | ICD-10-CM | POA: Diagnosis not present

## 2021-06-13 DIAGNOSIS — D849 Immunodeficiency, unspecified: Secondary | ICD-10-CM | POA: Insufficient documentation

## 2021-06-13 DIAGNOSIS — Z8249 Family history of ischemic heart disease and other diseases of the circulatory system: Secondary | ICD-10-CM

## 2021-06-13 DIAGNOSIS — M3212 Pericarditis in systemic lupus erythematosus: Principal | ICD-10-CM | POA: Diagnosis present

## 2021-06-13 DIAGNOSIS — R000 Tachycardia, unspecified: Secondary | ICD-10-CM | POA: Diagnosis not present

## 2021-06-13 DIAGNOSIS — Z86001 Personal history of in-situ neoplasm of cervix uteri: Secondary | ICD-10-CM

## 2021-06-13 DIAGNOSIS — Z6834 Body mass index (BMI) 34.0-34.9, adult: Secondary | ICD-10-CM | POA: Diagnosis not present

## 2021-06-13 DIAGNOSIS — Z833 Family history of diabetes mellitus: Secondary | ICD-10-CM

## 2021-06-13 DIAGNOSIS — G35 Multiple sclerosis: Secondary | ICD-10-CM | POA: Insufficient documentation

## 2021-06-13 DIAGNOSIS — A419 Sepsis, unspecified organism: Secondary | ICD-10-CM | POA: Diagnosis present

## 2021-06-13 DIAGNOSIS — M329 Systemic lupus erythematosus, unspecified: Secondary | ICD-10-CM | POA: Diagnosis present

## 2021-06-13 DIAGNOSIS — R079 Chest pain, unspecified: Secondary | ICD-10-CM | POA: Diagnosis not present

## 2021-06-13 DIAGNOSIS — D649 Anemia, unspecified: Secondary | ICD-10-CM | POA: Diagnosis present

## 2021-06-13 DIAGNOSIS — E669 Obesity, unspecified: Secondary | ICD-10-CM | POA: Insufficient documentation

## 2021-06-13 DIAGNOSIS — R0789 Other chest pain: Principal | ICD-10-CM | POA: Insufficient documentation

## 2021-06-13 DIAGNOSIS — R7982 Elevated C-reactive protein (CRP): Secondary | ICD-10-CM | POA: Diagnosis present

## 2021-06-13 DIAGNOSIS — K219 Gastro-esophageal reflux disease without esophagitis: Secondary | ICD-10-CM | POA: Diagnosis not present

## 2021-06-13 DIAGNOSIS — J9811 Atelectasis: Secondary | ICD-10-CM | POA: Diagnosis not present

## 2021-06-13 DIAGNOSIS — Z20822 Contact with and (suspected) exposure to covid-19: Secondary | ICD-10-CM | POA: Insufficient documentation

## 2021-06-13 DIAGNOSIS — Z79899 Other long term (current) drug therapy: Secondary | ICD-10-CM | POA: Insufficient documentation

## 2021-06-13 DIAGNOSIS — R6510 Systemic inflammatory response syndrome (SIRS) of non-infectious origin without acute organ dysfunction: Secondary | ICD-10-CM | POA: Insufficient documentation

## 2021-06-13 DIAGNOSIS — M940 Chondrocostal junction syndrome [Tietze]: Secondary | ICD-10-CM | POA: Diagnosis present

## 2021-06-13 LAB — URINALYSIS, COMPLETE (UACMP) WITH MICROSCOPIC
Bacteria, UA: NONE SEEN
Bilirubin Urine: NEGATIVE
Glucose, UA: NEGATIVE mg/dL
Hgb urine dipstick: NEGATIVE
Ketones, ur: NEGATIVE mg/dL
Leukocytes,Ua: NEGATIVE
Nitrite: NEGATIVE
Protein, ur: NEGATIVE mg/dL
Specific Gravity, Urine: >1.046 — ABNORMAL HIGH (ref 1.005–1.030)
pH: 6 (ref 5.0–8.0)

## 2021-06-13 LAB — BASIC METABOLIC PANEL
Anion gap: 9 (ref 5–15)
BUN: 14 mg/dL (ref 6–20)
CO2: 22 mmol/L (ref 22–32)
Calcium: 8.9 mg/dL (ref 8.9–10.3)
Chloride: 109 mmol/L (ref 98–111)
Creatinine, Ser: 0.62 mg/dL (ref 0.44–1.00)
GFR, Estimated: >60 mL/min (ref >60)
Glucose, Bld: 95 mg/dL (ref 70–99)
Potassium: 3.8 mmol/L (ref 3.5–5.1)
Sodium: 140 mmol/L (ref 135–145)

## 2021-06-13 LAB — CBC
HCT: 31.5 % — ABNORMAL LOW (ref 36.0–46.0)
Hemoglobin: 10.1 g/dL — ABNORMAL LOW (ref 12.0–15.0)
MCH: 27.7 pg (ref 26.0–34.0)
MCHC: 32.1 g/dL (ref 30.0–36.0)
MCV: 86.5 fL (ref 80.0–100.0)
Platelets: 354 10*3/uL (ref 150–400)
RBC: 3.64 MIL/uL — ABNORMAL LOW (ref 3.87–5.11)
RDW: 13.8 % (ref 11.5–15.5)
WBC: 11.7 10*3/uL — ABNORMAL HIGH (ref 4.0–10.5)
nRBC: 0 % (ref 0.0–0.2)

## 2021-06-13 LAB — CBC WITH DIFFERENTIAL/PLATELET
Abs Immature Granulocytes: 0.08 10*3/uL — ABNORMAL HIGH (ref 0.00–0.07)
Basophils Absolute: 0 10*3/uL (ref 0.0–0.1)
Basophils Relative: 0 %
Eosinophils Absolute: 0 10*3/uL (ref 0.0–0.5)
Eosinophils Relative: 0 %
HCT: 31.3 % — ABNORMAL LOW (ref 36.0–46.0)
Hemoglobin: 9.6 g/dL — ABNORMAL LOW (ref 12.0–15.0)
Immature Granulocytes: 1 %
Lymphocytes Relative: 2 %
Lymphs Abs: 0.2 10*3/uL — ABNORMAL LOW (ref 0.7–4.0)
MCH: 26.7 pg (ref 26.0–34.0)
MCHC: 30.7 g/dL (ref 30.0–36.0)
MCV: 87.2 fL (ref 80.0–100.0)
Monocytes Absolute: 0.3 10*3/uL (ref 0.1–1.0)
Monocytes Relative: 3 %
Neutro Abs: 11.1 10*3/uL — ABNORMAL HIGH (ref 1.7–7.7)
Neutrophils Relative %: 94 %
Platelets: 337 10*3/uL (ref 150–400)
RBC: 3.59 MIL/uL — ABNORMAL LOW (ref 3.87–5.11)
RDW: 13.9 % (ref 11.5–15.5)
WBC: 11.7 10*3/uL — ABNORMAL HIGH (ref 4.0–10.5)
nRBC: 0 % (ref 0.0–0.2)

## 2021-06-13 LAB — D-DIMER, QUANTITATIVE: D-Dimer, Quant: 3.82 ug/mL-FEU — ABNORMAL HIGH (ref 0.00–0.50)

## 2021-06-13 LAB — IRON AND TIBC
Iron: 16 ug/dL — ABNORMAL LOW (ref 28–170)
Saturation Ratios: 6 % — ABNORMAL LOW (ref 10.4–31.8)
TIBC: 290 ug/dL (ref 250–450)
UIBC: 274 ug/dL

## 2021-06-13 LAB — MAGNESIUM: Magnesium: 1.8 mg/dL (ref 1.7–2.4)

## 2021-06-13 LAB — RAPID URINE DRUG SCREEN, HOSP PERFORMED
Amphetamines: NOT DETECTED
Barbiturates: NOT DETECTED
Benzodiazepines: NOT DETECTED
Cocaine: NOT DETECTED
Opiates: NOT DETECTED
Tetrahydrocannabinol: NOT DETECTED

## 2021-06-13 LAB — LACTIC ACID, PLASMA
Lactic Acid, Venous: 1.5 mmol/L (ref 0.5–1.9)
Lactic Acid, Venous: 0.8 mmol/L (ref 0.5–1.9)

## 2021-06-13 LAB — PROCALCITONIN: Procalcitonin: <0.1 ng/mL

## 2021-06-13 LAB — HEPATIC FUNCTION PANEL
ALT: 17 U/L (ref 0–44)
AST: 19 U/L (ref 15–41)
Albumin: 3.2 g/dL — ABNORMAL LOW (ref 3.5–5.0)
Alkaline Phosphatase: 71 U/L (ref 38–126)
Bilirubin, Direct: 0.1 mg/dL (ref 0.0–0.2)
Indirect Bilirubin: 0.8 mg/dL (ref 0.3–0.9)
Total Bilirubin: 0.9 mg/dL (ref 0.3–1.2)
Total Protein: 7.4 g/dL (ref 6.5–8.1)

## 2021-06-13 LAB — RETICULOCYTES
Immature Retic Fract: 8.4 % (ref 2.3–15.9)
RBC.: 3.46 MIL/uL — ABNORMAL LOW (ref 3.87–5.11)
Retic Count, Absolute: 52.6 10*3/uL (ref 19.0–186.0)
Retic Ct Pct: 1.5 % (ref 0.4–3.1)

## 2021-06-13 LAB — C-REACTIVE PROTEIN: CRP: 12.4 mg/dL — ABNORMAL HIGH (ref <1.0)

## 2021-06-13 LAB — RESP PANEL BY RT-PCR (FLU A&B, COVID) ARPGX2
Influenza A by PCR: NEGATIVE
Influenza B by PCR: NEGATIVE
SARS Coronavirus 2 by RT PCR: NEGATIVE

## 2021-06-13 LAB — TROPONIN I (HIGH SENSITIVITY)
Troponin I (High Sensitivity): 3 ng/L (ref <18)
Troponin I (High Sensitivity): 71 ng/L — ABNORMAL HIGH (ref <18)
Troponin I (High Sensitivity): 8 ng/L (ref <18)

## 2021-06-13 LAB — SEDIMENTATION RATE: Sed Rate: 91 mm/hr — ABNORMAL HIGH (ref 0–22)

## 2021-06-13 LAB — PHOSPHORUS: Phosphorus: 2.1 mg/dL — ABNORMAL LOW (ref 2.5–4.6)

## 2021-06-13 LAB — FOLATE: Folate: 7.6 ng/mL (ref >5.9)

## 2021-06-13 LAB — CK: Total CK: 30 U/L — ABNORMAL LOW (ref 38–234)

## 2021-06-13 LAB — VITAMIN B12: Vitamin B-12: 168 pg/mL — ABNORMAL LOW (ref 180–914)

## 2021-06-13 LAB — I-STAT BETA HCG BLOOD, ED (MC, WL, AP ONLY): I-stat hCG, quantitative: <5 m[IU]/mL (ref <5)

## 2021-06-13 LAB — BRAIN NATRIURETIC PEPTIDE: B Natriuretic Peptide: 20.8 pg/mL (ref 0.0–100.0)

## 2021-06-13 LAB — TSH: TSH: 0.444 u[IU]/mL (ref 0.350–4.500)

## 2021-06-13 LAB — FERRITIN: Ferritin: 345 ng/mL — ABNORMAL HIGH (ref 11–307)

## 2021-06-13 LAB — PROTIME-INR
INR: 1.2 (ref 0.8–1.2)
Prothrombin Time: 14.7 seconds (ref 11.4–15.2)

## 2021-06-13 LAB — APTT: aPTT: 32 seconds (ref 24–36)

## 2021-06-13 MED ORDER — HYDROCODONE-ACETAMINOPHEN 5-325 MG PO TABS
1.0000 | ORAL_TABLET | ORAL | Status: DC | PRN
  Administered 2021-06-14: 1 via ORAL
  Filled 2021-06-13: qty 1

## 2021-06-13 MED ORDER — SODIUM CHLORIDE 0.9 % IV SOLN
2.0000 g | Freq: Once | INTRAVENOUS | Status: AC
  Administered 2021-06-14: 2 g via INTRAVENOUS
  Filled 2021-06-13: qty 12.5

## 2021-06-13 MED ORDER — ACETAMINOPHEN 325 MG PO TABS
650.0000 mg | ORAL_TABLET | Freq: Four times a day (QID) | ORAL | Status: DC | PRN
  Administered 2021-06-13: 650 mg via ORAL
  Filled 2021-06-13 (×2): qty 2

## 2021-06-13 MED ORDER — LORAZEPAM 2 MG/ML IJ SOLN
0.5000 mg | Freq: Once | INTRAMUSCULAR | Status: AC
  Administered 2021-06-13: 0.5 mg via INTRAVENOUS
  Filled 2021-06-13: qty 1

## 2021-06-13 MED ORDER — ACETAMINOPHEN 650 MG RE SUPP: 650.0000 mg | Freq: Four times a day (QID) | RECTAL | Status: DC | PRN

## 2021-06-13 MED ORDER — HYDROMORPHONE HCL 1 MG/ML IJ SOLN
1.0000 mg | Freq: Once | INTRAMUSCULAR | Status: AC
  Administered 2021-06-13: 1 mg via INTRAMUSCULAR
  Filled 2021-06-13: qty 1

## 2021-06-13 MED ORDER — SODIUM CHLORIDE 0.9 % IV SOLN
2.0000 g | Freq: Three times a day (TID) | INTRAVENOUS | Status: DC
  Administered 2021-06-14 – 2021-06-15 (×4): 2 g via INTRAVENOUS
  Filled 2021-06-13 (×4): qty 12.5

## 2021-06-13 MED ORDER — SODIUM CHLORIDE 0.9 % IV BOLUS
1000.0000 mL | Freq: Once | INTRAVENOUS | Status: AC
  Administered 2021-06-13: 1000 mL via INTRAVENOUS

## 2021-06-13 MED ORDER — METRONIDAZOLE 500 MG/100ML IV SOLN
500.0000 mg | Freq: Two times a day (BID) | INTRAVENOUS | Status: DC
  Administered 2021-06-14 (×3): 500 mg via INTRAVENOUS
  Filled 2021-06-13 (×3): qty 100

## 2021-06-13 MED ORDER — LORATADINE 10 MG PO TABS: 10.0000 mg | ORAL_TABLET | Freq: Every day | ORAL | Status: DC | PRN

## 2021-06-13 MED ORDER — SODIUM CHLORIDE (PF) 0.9 % IJ SOLN
INTRAMUSCULAR | Status: AC
  Filled 2021-06-13: qty 50

## 2021-06-13 MED ORDER — METOPROLOL TARTRATE 25 MG PO TABS
12.5000 mg | ORAL_TABLET | Freq: Two times a day (BID) | ORAL | Status: DC
  Administered 2021-06-13 – 2021-06-14 (×2): 12.5 mg via ORAL
  Filled 2021-06-13 (×2): qty 1

## 2021-06-13 MED ORDER — IOHEXOL 350 MG/ML SOLN
100.0000 mL | Freq: Once | INTRAVENOUS | Status: AC | PRN
  Administered 2021-06-13: 100 mL via INTRAVENOUS

## 2021-06-13 MED ORDER — SODIUM CHLORIDE 0.9 % IV BOLUS
1000.0000 mL | Freq: Once | INTRAVENOUS | Status: AC
Start: 2021-06-13 — End: 2021-06-13
  Administered 2021-06-13: 1000 mL via INTRAVENOUS

## 2021-06-13 MED ORDER — MORPHINE SULFATE (PF) 2 MG/ML IV SOLN
2.0000 mg | INTRAVENOUS | Status: DC | PRN
  Administered 2021-06-14 – 2021-06-15 (×2): 2 mg via INTRAVENOUS
  Filled 2021-06-13 (×2): qty 1

## 2021-06-13 MED ORDER — SODIUM CHLORIDE 0.9 % IV BOLUS
500.0000 mL | Freq: Once | INTRAVENOUS | Status: AC
  Administered 2021-06-13: 500 mL via INTRAVENOUS

## 2021-06-13 MED ORDER — VANCOMYCIN HCL 1500 MG/300ML IV SOLN
1500.0000 mg | Freq: Once | INTRAVENOUS | Status: AC
  Administered 2021-06-13: 1500 mg via INTRAVENOUS
  Filled 2021-06-13: qty 300

## 2021-06-13 MED ORDER — KETOROLAC TROMETHAMINE 30 MG/ML IJ SOLN
30.0000 mg | Freq: Once | INTRAMUSCULAR | Status: AC
  Administered 2021-06-13: 30 mg via INTRAMUSCULAR
  Filled 2021-06-13: qty 1

## 2021-06-13 MED ORDER — VANCOMYCIN HCL 750 MG/150ML IV SOLN
750.0000 mg | Freq: Two times a day (BID) | INTRAVENOUS | Status: DC
  Administered 2021-06-14 (×2): 750 mg via INTRAVENOUS
  Filled 2021-06-13 (×3): qty 150

## 2021-06-13 MED ORDER — SODIUM CHLORIDE 0.9 % IV SOLN
75.0000 mL/h | INTRAVENOUS | Status: AC
  Administered 2021-06-13: 75 mL/h via INTRAVENOUS

## 2021-06-13 MED ORDER — METHYLPREDNISOLONE SODIUM SUCC 125 MG IJ SOLR
125.0000 mg | Freq: Once | INTRAMUSCULAR | Status: AC
  Administered 2021-06-13: 125 mg via INTRAVENOUS
  Filled 2021-06-13: qty 2

## 2021-06-13 NOTE — Assessment & Plan Note (Signed)
Atypical given elevated troponin we will continue to cycle cardiac enzymes EKG nonischemic but continues to be tachycardic CTA negative for PE. ?ER discussed with cardiology patient will be admitted for further observation obtain echogram ?Supportive management ?Check sed rate CRP complement level to see if lupus is playing a role.  Was given a dose of steroids in the emergency department treating possible lupus flare ?

## 2021-06-13 NOTE — Assessment & Plan Note (Signed)
Add BB and if BP stable can resume benicar ?

## 2021-06-13 NOTE — Progress Notes (Signed)
Pharmacy Antibiotic Note ? ?Emily Hicks is a 36 y.o. female admitted on 06/13/2021 with sepsis.  Pharmacy has been consulted for Vanco, Cefepime dosing. ? ?Active Problem(s): CP radiating to L arm and upper back. SOB, tachycardia, tachypnea ? ?PMH: Lupus, chronic tachycardia,  ? ?ID: r/o sepsis. Temp 99.7, WBC 11.7, Scr <1.  ?Vanco 5/11>> ?Cefepime 5/11>> ? ?Plan: ?Vanco 1500mg  IV x 1 in ED ?Vancomycin 750 mg IV Q 12 hrs. Goal AUC 400-550. ?Expected AUC: 538 ?SCr used: 0.8 ? ?Cefepime 2g IV q8hr ? ? ? ? ? ?Height: 5\' 1"  (154.9 cm) ?Weight: 83.9 kg (185 lb) ?IBW/kg (Calculated) : 47.8 ? ?Temp (24hrs), Avg:99.7 ?F (37.6 ?C), Min:99.7 ?F (37.6 ?C), Max:99.7 ?F (37.6 ?C) ? ?Recent Labs  ?Lab 06/13/21 ?1141 06/13/21 ?1206  ?WBC 11.7*  --   ?CREATININE  --  0.62  ?  ?Estimated Creatinine Clearance: 97.3 mL/min (by C-G formula based on SCr of 0.62 mg/dL).   ? ?No Known Allergies ? ?Antimicrobials this admission: ?Razia Screws S. 08/13/21, PharmD, BCPS ?Clinical Staff Pharmacist [email or web address] ? ? ?08/13/21, Merilynn Finland ?06/13/2021 9:06 PM ? ?

## 2021-06-13 NOTE — Assessment & Plan Note (Addendum)
Chronic unsure if currently playing a role we will check C RP sed rate and complement levels  ?Given possible sepsis we will hold off on CellCept and Plaquenil for tonight if work-up more consistent with lupus flare can restart and add steroids ?May benefit from inpatient rheumatological consult with case discussion rheumatologist in the morning ?

## 2021-06-13 NOTE — ED Triage Notes (Signed)
Patient c/o left chest pain since last night. Patient reports SOB that started today. ?

## 2021-06-13 NOTE — ED Provider Notes (Signed)
Care of the patient assumed at signout.  On signout the patient was awaiting a second troponin value.  This was elevated compared to the first, going from 1 to a follow-up of 71.  On repeat exam the patient continue of tachycardia, in the 140s, tachypnea.  We reviewed CT scan which was reassuring for PE, but given persistent chest soreness, elevated troponin discussed case with cardiology and they will follow as a consulting service.  No heparin currently indicated.  Patient received steroids, additional fluids, admitted to the internal medicine team. ?  ?Gerhard Munch, MD ?06/13/21 2030 ? ?

## 2021-06-13 NOTE — H&P (Signed)
? ? ?Emily Hicks [id number] DOB: 03-11-1985 DOA: 06/13/2021 ? ? ?  ?PCP: Emily Guiles, DO   ?Outpatient Specialists:   ?Rheumatology Emily Hicks ?  ? ?Patient arrived to ER on 06/13/21 at 1002 ?Referred by Attending Emily Muskrat, MD ? ? ?Patient coming from:   ? home Lives With family ?  ? ?Chief Complaint:   ?Chief Complaint  ?Patient presents with  ? Chest Pain  ? Shortness of Breath  ? ? ?HPI: ?Emily Hicks is a 36 y.o. female with medical history significant of lupus hypertension ?  ? ?Presented with chest pain starting from yesterday and lasted all day ?Presents for of left-sided chest pain radiating to his left arm constant since last night worse with sitting up and positional changes has a bit of shortness of breath for that and deep breathing makes it worse.  No cough or fever.  No nausea no vomiting no prior history of DVT or PE.  She is chronically tachycardic.  No known history of CAD reports of palpitations. ?Patient has history of lupus for which she takes Plaquenil and prednisone ? She has been on a medication in the past to control her HR but go off it and the tachycardia has came back ?No fever  ?No tobacco no ETOH ?  ?She came off Prednisone taper 2 wks ago ? ?Regarding pertinent Chronic problems:    ?  ? ? HTN on Benicar Norvasc ?    ? obesity-   ?BMI Readings from Last 1 Encounters:  ?06/13/21 34.96 kg/m?  ? ? SLE - since 2020 on Cell cept ?And plaquenil, says she only takes prednisone ?  ?  ?While in ER: ?  ?White blood cell count elevated 11.7 patient is on prednisone elevated D-dimer noted troponin elevated at 70 ? ?EKG showing persistent tachycardia IV borderline T wave abnormalities with diffuse leads but no different from prior patient was given a dose of Toradol and Dilaudid ?CTA negative for PE pain seem to be reproducible palpation ?  ?Started on steroids ?   ?CXR - Low lung volumes. ?2. Suspect small pleural effusions ?   ?CTA chest -  non-acute, no PE,  no evidence of  infiltrate ?No definite evidence of pulmonary embolus is noted. ?  ?Mild bibasilar subsegmental atelectasis is noted. ?   ?Following Medications were ordered in ER: ?Medications  ?ketorolac (TORADOL) 30 MG/ML injection 30 mg (30 mg Intramuscular Given 06/13/21 1131)  ?HYDROmorphone (DILAUDID) injection 1 mg (1 mg Intramuscular Given 06/13/21 1132)  ?sodium chloride 0.9 % bolus 1,000 mL (0 mLs Intravenous Stopped 06/13/21 1333)  ?iohexol (OMNIPAQUE) 350 MG/ML injection 100 mL (100 mLs Intravenous Contrast Given 06/13/21 1530)  ?sodium chloride (PF) 0.9 % injection (  Given 06/13/21 1530)  ?sodium chloride 0.9 % bolus 1,000 mL (0 mLs Intravenous Stopped 06/13/21 1717)  ?LORazepam (ATIVAN) injection 0.5 mg (0.5 mg Intravenous Given 06/13/21 1644)  ?sodium chloride 0.9 % bolus 1,000 mL (1,000 mLs Intravenous Bolus 06/13/21 1919)  ?methylPREDNISolone sodium succinate (SOLU-MEDROL) 125 mg/2 mL injection 125 mg (125 mg Intravenous Given 06/13/21 1919)  ?  ?_______________________________________________________ ?ER Provider Called:  Cardiology     [place] ?They Recommend admit to medicine   ?Will see in AM    ?  ?ED Triage Vitals  ?Enc Vitals Group  ?   BP 06/13/21 1020 129/82  ?   Pulse Rate 06/13/21 1020 (!) 122  ?   Resp 06/13/21 1020 18  ?   Temp 06/13/21 1020  99.7 ?F (37.6 ?C)  ?   Temp Source 06/13/21 1020 Oral  ?   SpO2 06/13/21 1026 100 %  ?   Weight 06/13/21 1023 185 lb (83.9 kg)  ?   Height 06/13/21 1023 5\' 1"  (1.549 m)  ?   Head Circumference --   ?   Peak Flow --   ?   Pain Score 06/13/21 1022 10  ?   Pain Loc --   ?   Pain Edu? --   ?   Excl. in [place]? --   ?[id number]    ? _________________________________________ ?Significant initial  Findings: ?Abnormal Labs Reviewed  ?CBC - Abnormal; Notable for the following components:  ?    Result Value  ? WBC 11.7 (*)   ? RBC 3.64 (*)   ? Hemoglobin 10.1 (*)   ? HCT 31.5 (*)   ? All other components within normal limits  ?D-DIMER, QUANTITATIVE - Abnormal; Notable for the  following components:  ? D-Dimer, Quant 3.82 (*)   ? All other components within normal limits  ?TROPONIN I (HIGH SENSITIVITY) - Abnormal; Notable for the following components:  ? Troponin I (High Sensitivity) 71 (*)   ? All other components within normal limits  ? ?  ?_________________________ ?Troponin 4 - 71 ?ECG: Ordered ?Personally reviewed by me showing: ?HR :  122 ?Rhythm:Sinus tachycardia ?Borderline T abnormalities, diffuse leads ?QTC 424  ? ?The recent clinical data is shown below. ?Vitals:  ? 06/13/21 1700 06/13/21 1730 06/13/21 1800 06/13/21 1900  ?BP: 127/89 (!) 137/94 (!) 137/101 (!) 132/96  ?Pulse: 88 80 96 (!) 136  ?Resp: (!) 25 (!) 33 (!) 23 (!) 36  ?Temp:      ?TempSrc:      ?SpO2: 96%  97% 96%  ?Weight:      ?Height:      ?  ?WBC ? ?   ?Component Value Date/Time  ? WBC 11.7 (H) 06/13/2021 1141  ? LYMPHSABS 0.9 04/20/2021 1925  ? LYMPHSABS 0.9 05/11/2018 1524  ? MONOABS 0.3 04/20/2021 1925  ? EOSABS 0.0 04/20/2021 1925  ? EOSABS 0.0 05/11/2018 1524  ? BASOSABS 0.0 04/20/2021 1925  ? BASOSABS 0.0 05/11/2018 1524  ?  ? ? UA  ordered ?   ?Results for orders placed or performed during the hospital encounter of 04/20/21  ?Culture, blood (routine x 2)     Status: None  ? Collection Time: 04/20/21  7:08 PM  ? Specimen: BLOOD  ?Result Value Ref Range Status  ? Specimen Description   Final  ?  BLOOD RIGHT ANTECUBITAL ?Performed at [facility], [place] [address] [place], [place] [address] ?  ? Special Requests   Final  ?  BOTTLES DRAWN AEROBIC AND ANAEROBIC Blood Culture adequate volume ?Performed at [facility], [place] [address] [place], [place] [address] ?  ? Culture   Final  ?  NO GROWTH 5 DAYS ?Performed at [facility], [place] [address], [place], [place] [address] ?  ? Report Status 04/26/2021 FINAL  Final  ?Resp Panel by RT-PCR (Flu A&B, Covid) Nasopharyngeal Swab     Status: None  ? Collection Time: 04/20/21  7:25 PM  ? Specimen: Nasopharyngeal Swab;  Nasopharyngeal(NP) swabs in vial transport medium  ?Result Value Ref Range Status  ? SARS Coronavirus 2 by RT PCR NEGATIVE NEGATIVE Final  ?      ? Influenza A by PCR NEGATIVE NEGATIVE Final  ? Influenza B by PCR NEGATIVE NEGATIVE  Final  ?      ?Culture, blood (routine x 2)     Status: None  ? Collection Time: 04/21/21 12:38 AM  ? Specimen: BLOOD  ?Result Value Ref Range Status  ? Specimen Description   Final  ?  BLOOD RIGHT HAND ?Performed at [facility], [place] [address] [place], [place] [address] ?  ? Special Requests   Final  ?  BOTTLES DRAWN AEROBIC AND ANAEROBIC Blood Culture adequate volume ?Performed at [facility], [place] [address] [place], [place] [address] ?  ? Culture   Final  ?  NO GROWTH 5 DAYS ?Performed at [facility], [place] [address] [place],  [address] ?  ? Report Status 04/26/2021 FINAL  Final  ? ? ? ?_______________________________________________ ?Hospitalist was called for admission for   Atypical chest pain ? Tachycardia ?   ?The following Work up has been ordered so far: ? ?Orders Placed This Encounter  ?Procedures  ? DG Chest 2 View  ? CT Angio Chest PE W and/or Wo Contrast  ? CBC  ? Brain natriuretic peptide  ? D-dimer, quantitative  ? Basic metabolic panel  ? TSH  ? Ambulatory referral to Cardiology  ? Document Height and Actual Weight  ? Cardiac monitoring  ? Saline Lock IV, Maintain IV access  ? Inpatient consult to Cardiology  ? Consult to hospitalist  ? Pulse oximetry, continuous  ? I-Stat beta hCG blood, ED  ? ED EKG within 10 minutes  ? EKG 12-Lead  ? EKG 12-Lead  ? EKG 12-Lead  ?  ? ?OTHER Significant initial  Findings: ? ?labs showing: ? ?  ?Recent Labs  ?Lab 06/13/21 ?1206  ?NA 140  ?K 3.8  ?CO2 22  ?GLUCOSE 95  ?BUN 14  ?CREATININE 0.62  ?CALCIUM 8.9  ? ? ?Cr   stable,    ?Lab Results  ?Component Value Date  ? CREATININE 0.62 06/13/2021  ? CREATININE 0.66 05/22/2021  ? CREATININE 0.51 05/20/2021  ? ? ?No results for input(s):  AST, ALT, ALKPHOS, BILITOT, PROT, ALBUMIN in the last 168 hours. ?Lab Results  ?Component Value Date  ? CALCIUM 8.9 06/13/2021  ? PHOS 2.2 (L) 08/16/2018  ? ?    ?   ?Plt: ?Lab Results  ?Component Value Date  ? PLT 354 06/13/2021

## 2021-06-13 NOTE — ED Notes (Signed)
ED TO INPATIENT HANDOFF REPORT ? ?ED Nurse Name and Phone #: [phone] Erick Colace, RN  ? ?S ?Name/Age/Gender ?Emily Hicks ?36 y.o. ?female ?Room/Bed: [id number] ? ?Code Status ?  Code Status: Prior ? ?Home/SNF/Other ?Home ?Patient oriented to: self, place, time, and situation ?Is this baseline? Yes  ? ?Triage Complete: Triage complete  ?Chief Complaint ?Chest pain [R07.9] ? ?Triage Note ?Patient c/o left chest pain since last night. Patient reports SOB that started today.  ? ?Allergies ?No Known Allergies ? ?Level of Care/Admitting Diagnosis ?ED Disposition   ? ? ED Disposition  ?Admit  ? Condition  ?--  ? Comment  ?Hospital [facility]: [facility] [id number] ? Level of Care: Progressive [102] ? Admit to Progressive based on following criteria: CARDIOVASCULAR & THORACIC of moderate stability with acute coronary syndrome symptoms/low risk myocardial infarction/hypertensive urgency/arrhythmias/heart failure potentially compromising stability and stable post cardiovascular intervention patients. ? May place patient in observation at [facility] or [facility] if equivalent level of care is available:: No ? Covid Evaluation: Asymptomatic - no recent exposure (last 10 days) testing not required ? Diagnosis: Chest pain [id number] ? Admitting Physician: Toy Baker [[id number]] ? Attending Physician: Toy Baker [[id number]] ?  ?  ? ?  ? ? ?B ?Medical/Surgery History ?Past Medical History:  ?Diagnosis Date  ? Cervical intraepithelial neoplasia III   ? Gallstones   ? GERD (gastroesophageal reflux disease)   ? only prn OTC occasionally  ? History of blood transfusion 2020  ? Lupus ([place]) dx 2020  ? Multiple sclerosis ([place]) dx 2020  ? Numbness and tingling of left leg   ? ?Past Surgical History:  ?Procedure Laterality Date  ? CERVICAL CONIZATION W/BX N/A 01/16/2020  ? Procedure: CERVICAL CONE BIOPSY, ENDOCERVICAL CURETTAGE;  Surgeon: Joseph Pierini, MD;  Location: [facility];  Service:  Gynecology;  Laterality: N/A;  request 7:30am OR start in [place] Gyn block ?requests 45 minutes  ? CHOLECYSTECTOMY N/A 12/28/2013  ? Procedure: LAPAROSCOPIC CHOLECYSTECTOMY;  Surgeon: Fanny Skates, MD;  Location: [facility];  Service: General;  Laterality: N/A;  ?  ? ?A ?IV Location/Drains/Wounds ?Patient Lines/Drains/Airways Status   ? ? Active Line/Drains/Airways   ? ? Name Placement date Placement time Site Days  ? Peripheral IV 06/13/21 20 G 1" Anterior;Proximal;Right Forearm 06/13/21  1209  Forearm  less than 1  ? Incision (Closed) 01/16/20 Vagina Other (Comment) 01/16/20  0705  -- 514  ? Pressure Injury 08/02/18 Sacrum Medial Stage II -  Partial thickness loss of dermis presenting as a shallow open ulcer with a red, pink wound bed without slough. small pinholes in buttocks 08/02/18  0550  -- 1046  ? ?  ?  ? ?  ? ? ?Intake/Output Last 24 hours ?No intake or output data in the 24 hours ending 06/13/21 2108 ? ?Labs/Imaging ?Results for orders placed or performed during the hospital encounter of 06/13/21 (from the past 48 hour(s))  ?CBC     Status: Abnormal  ? Collection Time: 06/13/21 11:41 AM  ?Result Value Ref Range  ? WBC 11.7 (H) 4.0 - 10.5 K/uL  ? RBC 3.64 (L) 3.87 - 5.11 MIL/uL  ? Hemoglobin 10.1 (L) 12.0 - 15.0 g/dL  ? HCT 31.5 (L) 36.0 - 46.0 %  ? MCV 86.5 80.0 - 100.0 fL  ? MCH 27.7 26.0 - 34.0 pg  ? MCHC 32.1 30.0 - 36.0 g/dL  ? RDW 13.8 11.5 - 15.5 %  ? Platelets 354 150 - 400 K/uL  ?  nRBC 0.0 0.0 - 0.2 %  ?  Comment: Performed at [facility], [place] [address] [place], [place] [address]  ?D-dimer, quantitative     Status: Abnormal  ? Collection Time: 06/13/21 11:41 AM  ?Result Value Ref Range  ? D-Dimer, Quant 3.82 (H) 0.00 - 0.50 ug/mL-FEU  ?  Comment: (NOTE) ?At the manufacturer cut-off value of 0.5 ?g/mL FEU, this assay has a ?negative predictive value of 95-100%.This assay is intended for use ?in conjunction with a clinical pretest probability (PTP) assessment ?model to exclude  pulmonary embolism (PE) and deep venous thrombosis ?(DVT) in outpatients suspected of PE or DVT. ?Results should be correlated with clinical presentation. ?Performed at [facility], [place] [address] ?[place], [place] [address] ?  ?Brain natriuretic peptide     Status: None  ? Collection Time: 06/13/21 11:42 AM  ?Result Value Ref Range  ? B Natriuretic Peptide 20.8 0.0 - 100.0 pg/mL  ?  Comment: Performed at [facility], [place] [address], [place], [place] [address]  ?I-Stat beta hCG blood, ED     Status: None  ? Collection Time: 06/13/21 11:54 AM  ?Result Value Ref Range  ? I-stat hCG, quantitative <5.0 <5 mIU/mL  ? Comment 3          ?  Comment:   GEST. AGE      CONC.  (mIU/mL) ?  <=1 WEEK        5 - 50 ?    2 WEEKS       50 - 500 ?    3 WEEKS       100 - 10,000 ?    4 WEEKS     1,000 - 30,000 ?       ?FEMALE AND NON-PREGNANT FEMALE: ?    LESS THAN 5 mIU/mL ?  ?Basic metabolic panel     Status: None  ? Collection Time: 06/13/21 12:06 PM  ?Result Value Ref Range  ? Sodium 140 135 - 145 mmol/L  ? Potassium 3.8 3.5 - 5.1 mmol/L  ? Chloride 109 98 - 111 mmol/L  ? CO2 22 22 - 32 mmol/L  ? Glucose, Bld 95 70 - 99 mg/dL  ?  Comment: Glucose reference range applies only to samples taken after fasting for at least 8 hours.  ? BUN 14 6 - 20 mg/dL  ? Creatinine, Ser 0.62 0.44 - 1.00 mg/dL  ? Calcium 8.9 8.9 - 10.3 mg/dL  ? GFR, Estimated >60 >60 mL/min  ?  Comment: (NOTE) ?Calculated using the CKD-EPI Creatinine Equation (2021) ?  ? Anion gap 9 5 - 15  ?  Comment: Performed at [facility], [place] [address], [place], [place] [address]  ?Troponin I (High Sensitivity)     Status: None  ? Collection Time: 06/13/21 12:06 PM  ?Result Value Ref Range  ? Troponin I (High Sensitivity) 3 <18 ng/L  ?  Comment: (NOTE) ?Elevated high sensitivity troponin I (hsTnI) values and significant  ?changes across serial measurements may suggest ACS but many other  ?chronic and acute conditions are  known to elevate hsTnI results.  ?Refer to the "Links" section for chest pain algorithms and additional  ?guidance. ?Performed at [facility], [place] [address] ?[place],  [address] ?  ?Troponin I (High Sensitivity)     Status: Abnormal  ? Collection Time: 06/13/21  4:50 PM  ?Result Value Ref Range  ? Troponin I (High Sensitivity) 71 (H) <18 ng/L  ?  Comment: DELTA CHECK NOTED ?(NOTE) ?Elevated high sensitivity troponin I (hsTnI) values and significant  ?changes across serial measurements may suggest ACS but many other  ?chronic and acute conditions are known to elevate hsTnI results.  ?Refer to the Links section for chest pain algorithms and additional  ?guidance. ?Performed at [facility], [place] [address] ?[place], [place] [address] ?  ?TSH     Status: None  ? Collection Time: 06/13/21  4:51 PM  ?Result Value Ref Range  ? TSH 0.444 0.350 - 4.500 uIU/mL  ?  Comment: Performed by a 3rd Generation assay with a functional sensitivity of <=0.01 uIU/mL. ?Performed at [facility], [place] [address], [place], [place] [address] ?  ? ?DG Chest 2 View ? ?Result Date: 06/13/2021 ?CLINICAL DATA:  Chest pain and shortness of breath. History of lupus. EXAM: CHEST - 2 VIEW COMPARISON:  05/20/2021. FINDINGS: Stable cardiomediastinal contours. The lungs are suboptimally inflated. Blunting of the posterior costophrenic angles may reflect small effusions. Mild subsegmental atelectasis noted in the lung bases. No airspace consolidation. IMPRESSION: 1. Low lung volumes. 2. Suspect small pleural effusions. Electronically Signed   By: Kerby Moors M.D.   On: 06/13/2021 10:46  ? ?CT Angio Chest PE W and/or Wo Contrast ? ?Result Date: 06/13/2021 ?CLINICAL DATA:  Chest pain, shortness of breath. EXAM: CT ANGIOGRAPHY CHEST WITH CONTRAST TECHNIQUE: Multidetector CT imaging of the chest was performed using the standard protocol during bolus administration of intravenous contrast.  Multiplanar CT image reconstructions and MIPs were obtained to evaluate the vascular anatomy. RADIATION DOSE REDUCTION: This exam was performed according to the departmental dose-optimization program which inc

## 2021-06-13 NOTE — ED Provider Triage Note (Signed)
Emergency Medicine Provider Triage Evaluation Note ? ?Emily Hicks , a 36 y.o. female  was evaluated in triage.  Pt complains of gradual onset, constant, sharp, left sided chest pain with radiation into left jaw and left shoulder that began last night. Pt also complains of SOB. No nausea, vomiting, diaphoresis. Hx of Lupus. Denies hx of DVT/PE. Reports she always has an elevated heart rate. Never smoker.  ? ?Review of Systems  ?Positive: + chest pain, SOB ?Negative: - nausea, vomiting, diaphoresis ? ?Physical Exam  ?There were no vitals taken for this visit. ?Gen:   Awake, no distress   ?Resp:  Normal effort  ?MSK:   Moves extremities without difficulty  ?Other:  Tachycardic.  ? ?Medical Decision Making  ?Medically screening exam initiated at 10:21 AM.  Appropriate orders placed.  Emily Hicks was informed that the remainder of the evaluation will be completed by another provider, this initial triage assessment does not replace that evaluation, and the importance of remaining in the ED until their evaluation is complete. ? ? ?  ?Tanda Rockers, PA-C ?06/13/21 1021 ? ?

## 2021-06-13 NOTE — Assessment & Plan Note (Addendum)
Obtain anemia panel Hemoccult stool suspect anemia of chronic disease ?Appears stable ?

## 2021-06-13 NOTE — ED Provider Notes (Signed)
?[facility]-EMERGENCY DEPT ?Provider Note ? ? ?CSN: [id number] ?Arrival date & time: 06/13/21  1002 ? ?  ? ?History ? ?Chief Complaint  ?Patient presents with  ? Chest Pain  ? Shortness of Breath  ? ? ?Emily Hicks is a 36 y.o. female. ? ?Patient with history of lupus presenting with left-sided chest pain rating to left arm and upper back that has been constant since last night.  She reports the pain is fairly constant but worse with sitting up and worse with position changes.  Associate with some shortness of breath and hurts with she deep breathes.  Denies any cough or fever.  Denies any nausea or vomiting.  Denies any diaphoresis or sweating.  She is tachycardic with states this is normal for her due to her medications.  Denies any history of previous DVT or PE.  No current blood thinner use.  No cardiac history.  No abdominal pain, nausea or vomiting. ?She reports having this pain in the past but never this severe. ? ?The history is provided by the patient.  ?Chest Pain ?Associated symptoms: palpitations and shortness of breath   ?Associated symptoms: no abdominal pain, no dizziness, no fever, no headache, no nausea, no vomiting and no weakness   ?Shortness of Breath ?Associated symptoms: chest pain   ?Associated symptoms: no abdominal pain, no fever, no headaches, no rash and no vomiting   ? ?  ? ?Home Medications ?Prior to Admission medications   ?Medication Sig Start Date End Date Taking? Authorizing Provider  ?amLODipine (NORVASC) 5 MG tablet Take 5 mg by mouth daily. 03/07/21   [provider]  ?diphenhydrAMINE (BENADRYL) 25 mg capsule Take 25 mg by mouth every 6 (six) hours as needed for allergies. ?Patient not taking: Reported on 05/20/2021    [provider]  ?hydrocortisone 2.5 % ointment Apply 1 application. topically daily as needed (rash). ?Patient not taking: Reported on 05/20/2021    [provider]  ?hydroxychloroquine (PLAQUENIL) 200 MG tablet Take 400  mg by mouth daily.    [provider]  ?ibuprofen (ADVIL) 600 MG tablet Take 600 mg by mouth every 6 (six) hours as needed for moderate pain.    [provider]  ?loratadine (CLARITIN) 10 MG tablet Take 10 mg by mouth daily as needed for allergies.    [provider]  ?mycophenolate (CELLCEPT) 500 MG tablet Take 500 mg by mouth 2 (two) times daily.    [provider]  ?olmesartan (BENICAR) 20 MG tablet Take 1 tablet (20 mg total) by mouth at bedtime. 05/20/21   Sabino Dick, DO  ?predniSONE (DELTASONE) 5 MG tablet Take 5-30 mg by mouth as directed. Take 6 tablets (30 mg) X 3 Days, Take 5 tablets (25 mg) X 3 Days, Take 4 tablets (20 mg) X 3 Days, Take 3 tablets (15 mg) X 3 Days, Take 2 tablets (10 mg) X 3 Days, Take 1 tablet (5 mg) X 3 Days 12/05/20   [provider]  ?   ? ?Allergies    ?Patient has no known allergies.   ? ?Review of Systems   ?Review of Systems  ?Constitutional:  Negative for activity change, appetite change and fever.  ?HENT:  Negative for congestion and rhinorrhea.   ?Respiratory:  Positive for chest tightness and shortness of breath.   ?Cardiovascular:  Positive for chest pain and palpitations.  ?Gastrointestinal:  Negative for abdominal pain, nausea and vomiting.  ?Genitourinary:  Negative for dysuria and hematuria.  ?Musculoskeletal:  Negative for arthralgias and myalgias.  ?Skin:  Negative for rash.  ?Neurological:  Negative for dizziness, weakness and headaches.  ? all other systems are negative except as noted in the HPI and PMH.  ? ?Physical Exam ?Updated Vital Signs ?BP 129/82 (BP Location: Right Arm)   Pulse (!) 122   Temp 99.7 ?F (37.6 ?C) (Oral)   Resp 18   Ht  (1.549 m)   Wt 83.9 kg   SpO2 100%   BMI 34.96 kg/m?  ?Physical Exam ?Vitals and nursing note reviewed.  ?Constitutional:   ?   General: She is in acute distress.  ?   Appearance: She is well-developed.  ?   Comments: Uncomfortable, tachycardic to the 120s  ?HENT:  ?    Head: Normocephalic and atraumatic.  ?   Mouth/Throat:  ?   Pharynx: No oropharyngeal exudate.  ?Eyes:  ?   Conjunctiva/sclera: Conjunctivae normal.  ?   Pupils: Pupils are equal, round, and reactive to light.  ?Neck:  ?   Comments: No meningismus. ?Cardiovascular:  ?   Rate and Rhythm: Regular rhythm. Tachycardia present.  ?   Heart sounds: Normal heart sounds. No murmur heard. ?Pulmonary:  ?   Effort: Pulmonary effort is normal. No respiratory distress.  ?   Breath sounds: Normal breath sounds.  ?   Comments: Pain worse with palpation left arm movement ?Chest:  ?   Chest wall: Tenderness present.  ?Abdominal:  ?   Palpations: Abdomen is soft.  ?   Tenderness: There is no abdominal tenderness. There is no guarding or rebound.  ?Musculoskeletal:     ?   General: No tenderness. Normal range of motion.  ?   Cervical back: Normal range of motion and neck supple.  ?Skin: ?   General: Skin is warm.  ?Neurological:  ?   Mental Status: She is alert and oriented to person, place, and time.  ?   Cranial Nerves: No cranial nerve deficit.  ?   Motor: No abnormal muscle tone.  ?   Coordination: Coordination normal.  ?   Comments:  5/5 strength throughout. CN 2-12 intact.Equal grip strength.   ?Psychiatric:     ?   Behavior: Behavior normal.  ? ? ?ED Results / Procedures / Treatments   ?Labs ?(all labs ordered are listed, but only abnormal results are displayed) ?Labs Reviewed  ?CBC - Abnormal; Notable for the following components:  ?    Result Value  ? WBC 11.7 (*)   ? RBC 3.64 (*)   ? Hemoglobin 10.1 (*)   ? HCT 31.5 (*)   ? All other components within normal limits  ?D-DIMER, QUANTITATIVE - Abnormal; Notable for the following components:  ? D-Dimer, Quant 3.82 (*)   ? All other components within normal limits  ?BRAIN NATRIURETIC PEPTIDE  ?BASIC METABOLIC PANEL  ?TSH  ?I-STAT BETA HCG BLOOD, ED ([facility], [facility], [facility] ONLY)  ?TROPONIN I (HIGH SENSITIVITY)  ?TROPONIN I (HIGH SENSITIVITY)  ? ? ?EKG ?EKG  Interpretation ? ?Date/Time:  Thursday Jun 13 2021 10:14:53 EDT ?Ventricular Rate:  122 ?PR Interval:  160 ?QRS Duration: 85 ?QT Interval:  297 ?QTC Calculation: 424 ?R Axis:   56 ?Text Interpretation: Sinus tachycardia Borderline T abnormalities, diffuse leads No significant change was found Confirmed by Glynn Octave [phone]) on 06/13/2021 11:06:04 AM ? ?Radiology ?DG Chest 2 View ? ?Result Date: 06/13/2021 ?CLINICAL DATA:  Chest pain and shortness of breath. History of lupus. EXAM: CHEST - 2 VIEW COMPARISON:  05/20/2021. FINDINGS: Stable cardiomediastinal contours. The lungs are suboptimally inflated. Blunting of the posterior costophrenic angles may reflect small effusions. Mild subsegmental atelectasis noted in the lung bases. No airspace consolidation. IMPRESSION: 1. Low lung volumes. 2. Suspect small pleural effusions. Electronically Signed   By: Signa Kell M.D.   On: 06/13/2021 10:46  ? ?CT Angio Chest PE W and/or Wo Contrast ? ?Result Date: 06/13/2021 ?CLINICAL DATA:  Chest pain, shortness of breath. EXAM: CT ANGIOGRAPHY CHEST WITH CONTRAST TECHNIQUE: Multidetector CT imaging of the chest was performed using the standard protocol during bolus administration of intravenous contrast. Multiplanar CT image reconstructions and MIPs were obtained to evaluate the vascular anatomy. RADIATION DOSE REDUCTION: This exam was performed according to the departmental dose-optimization program which includes automated exposure control, adjustment of the mA and/or kV according to patient size and/or use of iterative reconstruction technique. CONTRAST:  OMNIPAQUE IOHEXOL 350 MG/ML SOLN COMPARISON:  April 20, 2021. FINDINGS: Cardiovascular: Satisfactory opacification of the pulmonary arteries to the segmental level. No evidence of pulmonary embolism. Mild cardiomegaly is noted. No pericardial effusion. Mediastinum/Nodes: No enlarged mediastinal, hilar, or axillary lymph nodes. Thyroid gland, trachea, and esophagus  demonstrate no significant findings. Lungs/Pleura: No pneumothorax or pleural effusion is noted. Mild bibasilar subsegmental atelectasis is noted. Upper Abdomen: No acute abnormality. Musculoskeletal: No chest wall abnormality. No acute or signi

## 2021-06-13 NOTE — Assessment & Plan Note (Signed)
-  SIRS criteria met with  elevated white blood cell count,    ?   ?Component Value Date/Time  ? WBC 11.7 (H) 06/13/2021 2150  ? LYMPHSABS 0.2 (L) 06/13/2021 2150  ? LYMPHSABS 0.9 05/11/2018 1524  ?  tachycardia   ,  fever   RR >20 ?Today's Vitals  ? 06/13/21 2050 06/13/21 2156 06/13/21 2217 06/13/21 2226  ?BP:  127/84 (!) 148/83   ?Pulse:  (!) 136 (!) 137   ?Resp:  (!) 30 (!) 23   ?Temp: (!) 102.5 ?F (39.2 ?C) 100 ?F (37.8 ?C) (!) 100.5 ?F (38.1 ?C)   ?TempSrc: Oral Oral Oral   ?SpO2:  99% 98%   ?Weight:      ?Height:      ?PainSc:    0-No pain  ? ?Body mass index is 34.96 kg/m?. ?  ?Vitals:  ? 06/13/21 2030 06/13/21 2050 06/13/21 2156 06/13/21 2217  ?BP: 133/86  127/84 (!) 148/83  ?Pulse: (!) 136  (!) 136 (!) 137  ?Resp: (!) 35  (!) 30 (!) 23  ?Temp:  (!) 102.5 ?F (39.2 ?C) 100 ?F (37.8 ?C) (!) 100.5 ?F (38.1 ?C)  ?TempSrc:  Oral Oral Oral  ?SpO2: 98%  99% 98%  ?Weight:      ?Height:      ? ? ?   ? Source of sepsis is unknown but given clinical picture will continue to treat ?    - Obtain serial lactic acid and procalcitonin level. ? - Initiated IV antibiotics in ER: ?Antibiotics Given (last 72 hours)   ? None  ?  ? ? ?Will continue  on : Vanco cefepime metronidazole ? ? - await results of blood and urine culture ? - Rehydrate aggressively  ?Intravenous fluids were administered  ?        ?10:59 PM ? ?

## 2021-06-13 NOTE — Plan of Care (Signed)

## 2021-06-13 NOTE — Assessment & Plan Note (Addendum)
Chronic recurrent TSH within normal limits check T4 to free obtain echogram.  Given elevated D-dimer check Dopplers.  CTA negative for PE. ?Obtain urine toxicity screen ? ?Worsening tachycardia in the setting of fever ?Add a BB if blood pressure allows ?

## 2021-06-13 NOTE — Subjective & Objective (Signed)
Presents for of left-sided chest pain radiating to his left arm constant since last night worse with sitting up and positional changes has a bit of shortness of breath for that and deep breathing makes it worse.  No cough or fever.  No nausea no vomiting no prior history of DVT or PE.  She is chronically tachycardic.  No known history of CAD reports of palpitations. ?Patient has history of lupus for which she takes Plaquenil and prednisone ?

## 2021-06-13 NOTE — ED Notes (Signed)
I attempted to collect labs and was unsuccessful.  I made nurse aware. 

## 2021-06-13 NOTE — Discharge Instructions (Signed)
Your testing today is negative for heart attack or blood clot in the lung.  You are referred to the cardiologist regarding your elevated heart rate and may  to be referred to the cardiologist regarding your elevated heart rate and may benefit from wearing a heart monitor at home.  You should return to the ED if you have exertional chest pain, pain associate with shortness of breath, vomiting, sweating, other concerns ?

## 2021-06-14 ENCOUNTER — Observation Stay (HOSPITAL_COMMUNITY): Payer: 59

## 2021-06-14 ENCOUNTER — Observation Stay (HOSPITAL_BASED_OUTPATIENT_CLINIC_OR_DEPARTMENT_OTHER): Payer: 59

## 2021-06-14 DIAGNOSIS — M3212 Pericarditis in systemic lupus erythematosus: Secondary | ICD-10-CM | POA: Diagnosis not present

## 2021-06-14 DIAGNOSIS — Z20822 Contact with and (suspected) exposure to covid-19: Secondary | ICD-10-CM | POA: Diagnosis not present

## 2021-06-14 DIAGNOSIS — R6510 Systemic inflammatory response syndrome (SIRS) of non-infectious origin without acute organ dysfunction: Secondary | ICD-10-CM | POA: Diagnosis not present

## 2021-06-14 DIAGNOSIS — D849 Immunodeficiency, unspecified: Secondary | ICD-10-CM | POA: Diagnosis not present

## 2021-06-14 DIAGNOSIS — M329 Systemic lupus erythematosus, unspecified: Secondary | ICD-10-CM | POA: Diagnosis not present

## 2021-06-14 DIAGNOSIS — Z6834 Body mass index (BMI) 34.0-34.9, adult: Secondary | ICD-10-CM | POA: Diagnosis not present

## 2021-06-14 DIAGNOSIS — R7982 Elevated C-reactive protein (CRP): Secondary | ICD-10-CM | POA: Diagnosis present

## 2021-06-14 DIAGNOSIS — Z8249 Family history of ischemic heart disease and other diseases of the circulatory system: Secondary | ICD-10-CM | POA: Diagnosis not present

## 2021-06-14 DIAGNOSIS — D649 Anemia, unspecified: Secondary | ICD-10-CM | POA: Diagnosis not present

## 2021-06-14 DIAGNOSIS — R0789 Other chest pain: Secondary | ICD-10-CM | POA: Diagnosis not present

## 2021-06-14 DIAGNOSIS — E669 Obesity, unspecified: Secondary | ICD-10-CM | POA: Diagnosis present

## 2021-06-14 DIAGNOSIS — R700 Elevated erythrocyte sedimentation rate: Secondary | ICD-10-CM | POA: Diagnosis present

## 2021-06-14 DIAGNOSIS — R7989 Other specified abnormal findings of blood chemistry: Secondary | ICD-10-CM

## 2021-06-14 DIAGNOSIS — Z833 Family history of diabetes mellitus: Secondary | ICD-10-CM | POA: Diagnosis not present

## 2021-06-14 DIAGNOSIS — R000 Tachycardia, unspecified: Secondary | ICD-10-CM | POA: Diagnosis present

## 2021-06-14 DIAGNOSIS — Z79899 Other long term (current) drug therapy: Secondary | ICD-10-CM | POA: Diagnosis not present

## 2021-06-14 DIAGNOSIS — I10 Essential (primary) hypertension: Secondary | ICD-10-CM | POA: Diagnosis not present

## 2021-06-14 DIAGNOSIS — R079 Chest pain, unspecified: Secondary | ICD-10-CM | POA: Diagnosis not present

## 2021-06-14 DIAGNOSIS — I308 Other forms of acute pericarditis: Secondary | ICD-10-CM | POA: Diagnosis not present

## 2021-06-14 DIAGNOSIS — M940 Chondrocostal junction syndrome [Tietze]: Secondary | ICD-10-CM | POA: Diagnosis not present

## 2021-06-14 DIAGNOSIS — K219 Gastro-esophageal reflux disease without esophagitis: Secondary | ICD-10-CM | POA: Diagnosis present

## 2021-06-14 DIAGNOSIS — J9811 Atelectasis: Secondary | ICD-10-CM | POA: Diagnosis not present

## 2021-06-14 DIAGNOSIS — G35 Multiple sclerosis: Secondary | ICD-10-CM | POA: Diagnosis not present

## 2021-06-14 DIAGNOSIS — Z86001 Personal history of in-situ neoplasm of cervix uteri: Secondary | ICD-10-CM | POA: Diagnosis not present

## 2021-06-14 LAB — TROPONIN I (HIGH SENSITIVITY): Troponin I (High Sensitivity): 10 ng/L (ref <18)

## 2021-06-14 LAB — CBC
HCT: 29.3 % — ABNORMAL LOW (ref 36.0–46.0)
Hemoglobin: 9 g/dL — ABNORMAL LOW (ref 12.0–15.0)
MCH: 27.1 pg (ref 26.0–34.0)
MCHC: 30.7 g/dL (ref 30.0–36.0)
MCV: 88.3 fL (ref 80.0–100.0)
Platelets: 305 10*3/uL (ref 150–400)
RBC: 3.32 MIL/uL — ABNORMAL LOW (ref 3.87–5.11)
RDW: 13.8 % (ref 11.5–15.5)
WBC: 12.3 10*3/uL — ABNORMAL HIGH (ref 4.0–10.5)
nRBC: 0 % (ref 0.0–0.2)

## 2021-06-14 LAB — RESPIRATORY PANEL BY PCR

## 2021-06-14 LAB — COMPREHENSIVE METABOLIC PANEL
ALT: 16 U/L (ref 0–44)
AST: 17 U/L (ref 15–41)
Albumin: 2.8 g/dL — ABNORMAL LOW (ref 3.5–5.0)
Alkaline Phosphatase: 61 U/L (ref 38–126)
Anion gap: 5 (ref 5–15)
BUN: 12 mg/dL (ref 6–20)
CO2: 19 mmol/L — ABNORMAL LOW (ref 22–32)
Calcium: 9 mg/dL (ref 8.9–10.3)
Chloride: 118 mmol/L — ABNORMAL HIGH (ref 98–111)
Creatinine, Ser: 0.63 mg/dL (ref 0.44–1.00)
GFR, Estimated: >60 mL/min (ref >60)
Glucose, Bld: 162 mg/dL — ABNORMAL HIGH (ref 70–99)
Potassium: 3.8 mmol/L (ref 3.5–5.1)
Sodium: 142 mmol/L (ref 135–145)
Total Bilirubin: 0.7 mg/dL (ref 0.3–1.2)
Total Protein: 6.6 g/dL (ref 6.5–8.1)

## 2021-06-14 LAB — ECHOCARDIOGRAM COMPLETE
AR max vel: 2.31 cm2
AV Peak grad: 13.2 mmHg
Ao pk vel: 1.82 m/s
Area-P 1/2: 5.2 cm2
Height: 61 in
S' Lateral: 3.1 cm
Weight: 2960 oz

## 2021-06-14 LAB — T4, FREE: Free T4: 1.01 ng/dL (ref 0.61–1.12)

## 2021-06-14 LAB — PREALBUMIN: Prealbumin: 7.5 mg/dL — ABNORMAL LOW (ref 18–38)

## 2021-06-14 LAB — HIV ANTIBODY (ROUTINE TESTING W REFLEX): HIV Screen 4th Generation wRfx: NONREACTIVE

## 2021-06-14 LAB — MRSA NEXT GEN BY PCR, NASAL: MRSA by PCR Next Gen: NOT DETECTED

## 2021-06-14 LAB — PROCALCITONIN: Procalcitonin: <0.1 ng/mL

## 2021-06-14 MED ORDER — SENNOSIDES-DOCUSATE SODIUM 8.6-50 MG PO TABS
1.0000 | ORAL_TABLET | Freq: Two times a day (BID) | ORAL | Status: DC
Start: 2021-06-15 — End: 2021-06-15
  Administered 2021-06-15: 1 via ORAL
  Filled 2021-06-14: qty 1

## 2021-06-14 MED ORDER — MAGNESIUM SULFATE IN D5W 1-5 GM/100ML-% IV SOLN
1.0000 g | Freq: Once | INTRAVENOUS | Status: AC
  Administered 2021-06-14: 1 g via INTRAVENOUS
  Filled 2021-06-14: qty 100

## 2021-06-14 MED ORDER — IBUPROFEN 800 MG PO TABS
800.0000 mg | ORAL_TABLET | Freq: Three times a day (TID) | ORAL | Status: DC
Start: 2021-06-14 — End: 2021-06-15
  Administered 2021-06-14 – 2021-06-15 (×3): 800 mg via ORAL
  Filled 2021-06-14 (×3): qty 1

## 2021-06-14 MED ORDER — POTASSIUM CHLORIDE CRYS ER 20 MEQ PO TBCR
20.0000 meq | EXTENDED_RELEASE_TABLET | Freq: Once | ORAL | Status: AC
  Administered 2021-06-14: 20 meq via ORAL
  Filled 2021-06-14: qty 1

## 2021-06-14 MED ORDER — METOPROLOL TARTRATE 25 MG PO TABS
25.0000 mg | ORAL_TABLET | Freq: Two times a day (BID) | ORAL | Status: DC
  Administered 2021-06-14: 25 mg via ORAL
  Filled 2021-06-14: qty 1

## 2021-06-14 MED ORDER — SENNOSIDES-DOCUSATE SODIUM 8.6-50 MG PO TABS: 1.0000 | ORAL_TABLET | Freq: Two times a day (BID) | ORAL | Status: DC

## 2021-06-14 MED ORDER — METOPROLOL TARTRATE 25 MG PO TABS
12.5000 mg | ORAL_TABLET | Freq: Once | ORAL | Status: AC
  Administered 2021-06-14: 12.5 mg via ORAL
  Filled 2021-06-14: qty 1

## 2021-06-14 NOTE — Consult Note (Addendum)
?Cardiology Consultation:  ? ?Patient ID: [name] ?MRN: [id number]; DOB: 07-10-1985 ? ?Admit date: 06/13/2021 ?Date of Consult: 06/14/2021 ? ?PCP:  [name], DO ?  ?[facility] Providers ?Cardiologist:  New to [place] ? ? ?Patient Profile:  ? ?Emily Hicks is a 36 y.o. female with a hx of SLE, multiple sclerosis, anemia, HTN and GERD who is being seen 06/14/2021 for the evaluation of possible pericarditis at the request of Dr. Roda Shutters. ? ?History of Present Illness:  ? ?[name] presented to [facility] ED for 1 day hx of L sided chest pain radiating to back and L arm. Worse with sitting up, deep breath and movement. Associated with SOB. No cough, fever or chills. No nausea, vomiting or diaphoresis. Some palpitations. No tobacco or alcohol use.  ? ? ?Patient notes that she is feeling poorly in 2023. She has had a steady increase in her immunosupressives for the past year.  Recently she had an increase in her Cellcept.  She had never had lupus pericarditis in the past. ? ?One day PT she has had positional chest pain.  No change with exertion but significant enough she cannot lay float.  SOB with the pain.  No fevers, chills.  No productive cough.   Patient exertion notable for doing her ADLS, with no change in symptoms.No PND or orthopnea, just laying down pain.  No weight gain, leg swelling , or abdominal swelling.  No syncope or near syncope . Notes  no palpitations or funny heart beats.  She has a persistently elevated resting heart rate at baseline. ? ?She was tachycardic in 120-140s on arrival with BP of 129/82. Admitted for further evaluation.  Temp of 102.5 F overnight.  ? ?Hs-troponin 3>>71>>8 ?CK total 30 ?CRP 12.4 ?Sed rate 91 ?Low vit B12 ?TSH and Free T4 are normal ?UDS clear  ?CT angio without PE ?LE venous doppler without DVT ? ?Added metoprolol 12.5mg  BID for tachycardia and abx for SIRS criteria  ? ?Cardiology asked to see for evaluation of possible pericarditis.  ? ?Past Medical History:   ?Diagnosis Date  ? Cervical intraepithelial neoplasia III   ? Gallstones   ? GERD (gastroesophageal reflux disease)   ? only prn OTC occasionally  ? History of blood transfusion 2020  ? Lupus (HCC) dx 2020  ? Multiple sclerosis (HCC) dx 2020  ? Numbness and tingling of left leg   ? ? ?Past Surgical History:  ?Procedure Laterality Date  ? CERVICAL CONIZATION W/BX N/A 01/16/2020  ? Procedure: CERVICAL CONE BIOPSY, ENDOCERVICAL CURETTAGE;  Surgeon: Theresia Majors, MD;  Location: [facility];  Service: Gynecology;  Laterality: N/A;  request 7:30am OR start in [place] [facility] block ?requests 45 minutes  ? CHOLECYSTECTOMY N/A 12/28/2013  ? Procedure: LAPAROSCOPIC CHOLECYSTECTOMY;  Surgeon: Claud Kelp, MD;  Location: [facility];  Service: General;  Laterality: N/A;  ?  ? ?Inpatient Medications: ?Scheduled Meds: ? ibuprofen  800 mg Oral TID  ? metoprolol tartrate  12.5 mg Oral Once  ? metoprolol tartrate  25 mg Oral BID  ? ?Continuous Infusions: ? ceFEPime (MAXIPIME) IV 2 g (06/14/21 0536)  ? metronidazole 500 mg (06/14/21 0835)  ? vancomycin 750 mg (06/14/21 1004)  ? ?PRN Meds: ?acetaminophen **OR** acetaminophen, HYDROcodone-acetaminophen, loratadine, morphine injection ? ?Allergies:   No Known Allergies ? ?Social History:   ?Social History  ? ?Socioeconomic History  ? Marital status: Married  ?  Spouse name: Not on file  ? Number of children: 1  ? Years  of education: 16  ? Highest education level: Bachelor's degree (e.g., BA, AB, BS)  ?Occupational History  ? Occupation: Accounting  ?Tobacco Use  ? Smoking status: Never  ? Smokeless tobacco: Never  ?Vaping Use  ? Vaping Use: Never used  ?Substance and Sexual Activity  ? Alcohol use: No  ? Drug use: No  ? Sexual activity: Yes  ?  Birth control/protection: Injection  ?  Comment: 1st intercourse 36 yo-Fewer than 5 partners  ?Other Topics Concern  ? Not on file  ?Social History Narrative  ? Lives at home with husband and son.  ? Right-handed.  ? No caffeine  use.  ? ?Social Determinants of Health  ? ?Financial Resource Strain: Not on file  ?Food Insecurity: Not on file  ?Transportation Needs: Not on file  ?Physical Activity: Not on file  ?Stress: Not on file  ?Social Connections: Not on file  ?Intimate Partner Violence: Not on file  ?  ?Family History:   ?Family History  ?Problem Relation Age of Onset  ? Hypertension Father   ? Hypertension Mother   ? Hypertension Sister   ? Diabetes Maternal Grandmother   ? Diabetes Maternal Grandfather   ?  ? ?ROS:  ?Please see the history of present illness. ?All other ROS reviewed and negative.    ? ?Physical Exam/Data:  ? ?Vitals:  ? 06/13/21 2324 06/14/21 0149 06/14/21 0533 06/14/21 0950  ?BP: 135/76 128/86 131/88 (!) 146/97  ?Pulse: (!) 124 (!) 113 (!) 111 (!) 117  ?Resp:  19 18 (!) 24  ?Temp: 99.5 ?F (37.5 ?C) 99.5 ?F (37.5 ?C) 98.3 ?F (36.8 ?C) 98.7 ?F (37.1 ?C)  ?TempSrc: Oral Oral Oral Oral  ?SpO2:  99% 99% 100%  ?Weight:      ?Height:      ? ?No intake or output data in the 24 hours ending 06/14/21 1039 ? ?  06/13/2021  ? 10:23 AM 05/20/2021  ?  2:59 PM 04/20/2021  ?  6:43 PM  ?Last 3 Weights  ?Weight (lbs) 185 lb 185 lb 9.6 oz 184 lb  ?Weight (kg) 83.915 kg 84.188 kg 83.462 kg  ?   ?Body mass index is 34.96 kg/m?.  ? ?Gen: no distress   ?Neck: No JVD ?Cardiac: No Rubs or Gallops, no Murmur, regular tachycardia, +2 radial pulses ?Respiratory: Clear to auscultation bilaterally,  effort,   respiratory rate ?GI: Soft, nontender, non-distended  ?MS: No  edema;  moves all extremities ?Integument: Slight rash at tome of forehead ?Neuro:  At time of evaluation, alert and oriented to person/place/time/situation  ?Psych: Normal affect, patient feels OK ? ? ?EKG:  The EKG was personally reviewed and demonstrates:  Sinus tachycardia, HR 112 bpm, diffuse T wave abnormality  ?Telemetry:  Telemetry was personally reviewed and demonstrates:  sinus tachycardia rates 110s-120s ? ?Relevant CV Studies: ?Pending echo  ? ?Laboratory Data: ? ?High  Sensitivity Troponin:   ?Recent Labs  ?Lab 05/20/21 ?1338 06/13/21 ?1206 06/13/21 ?1650 06/13/21 ?2146  ?TROPONINIHS 4 3 71* 8  ?   ?Chemistry ?Recent Labs  ?Lab 06/13/21 ?1206 06/13/21 ?2150 06/14/21 ?0324  ?NA 140  --  142  ?K 3.8  --  3.8  ?CL 109  --  118*  ?CO2 22  --  19*  ?GLUCOSE 95  --  162*  ?BUN 14  --  12  ?CREATININE 0.62  --  0.63  ?CALCIUM 8.9  --  9.0  ?MG  --  1.8  --   ?GFRNONAA >60  --  >  60  ?ANIONGAP 9  --  5  ?  ?Recent Labs  ?Lab 06/13/21 ?2150 06/14/21 ?0324  ?PROT 7.4 6.6  ?ALBUMIN 3.2* 2.8*  ?AST 19 17  ?ALT 17 16  ?ALKPHOS 71 61  ?BILITOT 0.9 0.7  ? ? ?Hematology ?Recent Labs  ?Lab 06/13/21 ?1141 06/13/21 ?2146 06/13/21 ?2150 06/14/21 ?0324  ?WBC 11.7*  --  11.7* 12.3*  ?RBC 3.64* 3.46* 3.59* 3.32*  ?HGB 10.1*  --  9.6* 9.0*  ?HCT 31.5*  --  31.3* 29.3*  ?MCV 86.5  --  87.2 88.3  ?MCH 27.7  --  26.7 27.1  ?MCHC 32.1  --  30.7 30.7  ?RDW 13.8  --  13.9 13.8  ?PLT 354  --  337 305  ? ?Thyroid  ?Recent Labs  ?Lab 06/13/21 ?1651 06/13/21 ?2146  ?TSH 0.444  --   ?FREET4  --  1.01  ?  ?BNP ?Recent Labs  ?Lab 06/13/21 ?1142  ?BNP 20.8  ?  ?DDimer  ?Recent Labs  ?Lab 06/13/21 ?1141  ?DDIMER 3.82*  ? ? ? ?Radiology/Studies:  ?DG Chest 2 View ? ?Result Date: 06/13/2021 ?CLINICAL DATA:  Chest pain and shortness of breath. History of lupus. EXAM: CHEST - 2 VIEW COMPARISON:  05/20/2021. FINDINGS: Stable cardiomediastinal contours. The lungs are suboptimally inflated. Blunting of the posterior costophrenic angles may reflect small effusions. Mild subsegmental atelectasis noted in the lung bases. No airspace consolidation. IMPRESSION: 1. Low lung volumes. 2. Suspect small pleural effusions. Electronically Signed   By: Signa Kell M.D.   On: 06/13/2021 10:46  ? ?CT Angio Chest PE W and/or Wo Contrast ? ?Result Date: 06/13/2021 ?CLINICAL DATA:  Chest pain, shortness of breath. EXAM: CT ANGIOGRAPHY CHEST WITH CONTRAST TECHNIQUE: Multidetector CT imaging of the chest was performed using the standard protocol  during bolus administration of intravenous contrast. Multiplanar CT image reconstructions and MIPs were obtained to evaluate the vascular anatomy. RADIATION DOSE REDUCTION: This exam was performed according

## 2021-06-14 NOTE — Progress Notes (Signed)
BLE venous duplex has been completed. ? ? ?Results can be found under chart review under CV PROC. ?06/14/2021 9:37 AM ?Breslin Burklow RVT, RDMS ? ?

## 2021-06-14 NOTE — Progress Notes (Signed)
OT Cancellation Note ? ?Patient Details ?Name: Emily Hicks ?MRN: [id number] ?DOB: August 17, 1985 ? ? ?Cancelled Treatment:    Reason Eval/Treat Not Completed: OT screened, no needs identified, will sign off ? ?Liticia Gasior L Deseree Zemaitis ?06/14/2021, 12:30 PM ?

## 2021-06-14 NOTE — Progress Notes (Signed)
?PROGRESS NOTE ? ? ? ?Emily BERNINGER  KGM:[id number] DOB: 30-Oct-1985 DOA: 06/13/2021 ?PCP: Wells Guiles, DO  ? ? ? ?Brief Narrative:  ? ?Emily Hicks is a 36 y.o. female with medical history significant of lupus hypertension,   ?Presented with chest pain since Wednesday, developed fever while in the ED, has significant tachycardia on presentation  ? ?Subjective: ? ?Fever appear has resolved ?Still have chest pain but is getting better ?Mother at bedside ? ?Assessment & Plan: ? Principal Problem: ?  Chest pain ?Active Problems: ?  Sepsis ([place]) ?  SLE (systemic lupus erythematosus related syndrome) ([place]) ?  Normocytic anemia ?  Tachycardia ?  Essential hypertension ? ? ? ?Assessment and Plan: ? ? ?Chest pain with sinus tachycardia ?-Troponin negative ?-CTA no PE, showed mild bibasilar subsegmental atelectasis but no acute infiltrate ?-Has elevated ESR CRP in the setting of lupus concern for pericarditis, received 1 dose of steroid in the ED ,cardiology consulted input appreciated ?-Also appears to have chest wall tenderness, cannot rule out costochondritis  ?-Started beta-blocker ? ? ?Fever, leukocytosis, sinus tachycardia ?-Need to rule out infection in an immunosuppressed individual ?-UA concentrated but no sign of infection, chest imaging no acute infiltrate, she denies sinus issues, no ear pain, no oral thrush, no dysphagia, no diarrhea, no stomach pain ?-Continue broad-spectrum antibiotic for now, follow-up on culture result and fever curve ?-Hold immunosuppressive drug for now ? ? ?Normocytic anemia ?Does not appear to have overt external signs of bleeding ?Check FOBT ?Monitor CBC ? ?SLE/immunosuppressed status ?-Follows rheumatologist ? ? ? Body mass index is 34.96 kg/m?.,  meet Obesity criteria ? ?. ? ? ?Skin Assessment: ?I have examined the patient?s skin and I agree with the wound assessment as performed by the wound care RN as outlined below: ?  ? ? ? ?I have Reviewed nursing notes, Vitals, pain scores,  I/o's, Lab results and  imaging results since pt's last encounter, details please see discussion above  ?I ordered the following labs:  ?Unresulted Labs (From admission, onward)  ? ?  Start     Ordered  ? 06/15/21 0500  Creatinine, serum  Tomorrow morning,   R       ? 06/14/21 0801  ? 06/14/21 0500  Procalcitonin  Daily,   R     ? 06/13/21 2057  ? 06/13/21 2027  T3  Add-on,   AD       ? 06/13/21 2026  ? 06/13/21 2022  C3 complement  Once,   URGENT       ? 06/13/21 2021  ? 06/13/21 2022  C4 complement  Once,   URGENT       ? 06/13/21 2021  ? Unscheduled  Occult blood card to lab, stool  As needed,   R     ? 06/13/21 2028  ? ?  ?  ? ?  ? ? ? ?DVT prophylaxis: SCDs Start: 06/13/21 2225 ? ? ?Code Status:   Code Status: Full Code ? ?Family Communication: Mother at bedside ?Disposition:  ? ?Dispo: The patient is from: Home ?             Anticipated d/c is to: Home ?             Anticipated d/c date is: TBD ? ?Antimicrobials:   ? ?Anti-infectives (From admission, onward)  ? ? Start     Dose/Rate Route Frequency Ordered Stop  ? 06/14/21 1000  vancomycin (VANCOREADY) IVPB 750 mg/150 mL       ?  750 mg ?150 mL/hr over 60 Minutes Intravenous Every 12 hours 06/13/21 2106    ? 06/14/21 0600  ceFEPIme (MAXIPIME) 2 g in sodium chloride 0.9 % 100 mL IVPB       ? 2 g ?200 mL/hr over 30 Minutes Intravenous Every 8 hours 06/13/21 2104    ? 06/13/21 2100  ceFEPIme (MAXIPIME) 2 g in sodium chloride 0.9 % 100 mL IVPB       ? 2 g ?200 mL/hr over 30 Minutes Intravenous  Once 06/13/21 2056 06/14/21 0042  ? 06/13/21 2100  metroNIDAZOLE (FLAGYL) IVPB 500 mg       ? 500 mg ?100 mL/hr over 60 Minutes Intravenous Every 12 hours 06/13/21 2056 06/20/21 2059  ? 06/13/21 2100  vancomycin (VANCOREADY) IVPB 1500 mg/300 mL       ? 1,500 mg ?150 mL/hr over 120 Minutes Intravenous  Once 06/13/21 2056 06/14/21 0140  ? ?  ? ? ? ? ? ?Objective: ?Vitals:  ? 06/14/21 0533 06/14/21 0950 06/14/21 1146 06/14/21 1300  ?BP: 131/88 (!) 146/97 (!) 139/92 139/85   ?Pulse: (!) 111 (!) 117  (!) 103  ?Resp: 18 (!) 24  (!) 26  ?Temp: 98.3 ?F (36.8 ?C) 98.7 ?F (37.1 ?C)  98 ?F (36.7 ?C)  ?TempSrc: Oral Oral  Oral  ?SpO2: 99% 100%  99%  ?Weight:      ?Height:      ? ? ?Intake/Output Summary (Last 24 hours) at 06/14/2021 2215 ?Last data filed at 06/14/2021 1600 ?Gross per 24 hour  ?Intake 1688.56 ml  ?Output --  ?Net 1688.56 ml  ? ?Filed Weights  ? 06/13/21 1023  ?Weight: 83.9 kg  ? ? ?Examination: ? ?General exam: alert, awake, communicative,calm, NAD ?Respiratory system: Clear to auscultation. Respiratory effort normal. ?Cardiovascular system: Sinus tachycardia.  ?Gastrointestinal system: Abdomen is nondistended, soft and nontender.  Normal bowel sounds heard. ?Central nervous system: Alert and oriented. No focal neurological deficits. ?Extremities:  no edema ?Skin: No rashes, lesions or ulcers ?Psychiatry: Judgement and insight appear normal. Mood & affect appropriate.  ? ? ? ?Data Reviewed: I have personally reviewed  labs and visualized  imaging studies since the last encounter and formulate the plan  ? ? ? ? ? ? ?Scheduled Meds: ? ibuprofen  800 mg Oral Q8H  ? metoprolol tartrate  25 mg Oral BID  ? ?Continuous Infusions: ? ceFEPime (MAXIPIME) IV 2 g (06/14/21 1416)  ? metronidazole 500 mg (06/14/21 2026)  ? vancomycin 750 mg (06/14/21 2150)  ? ? ? LOS: 0 days  ? ? ? ?Florencia Reasons, MD PhD FACP ?[facility] ? ?Available via Epic secure chat 7am-7pm for nonurgent issues ?Please page for urgent issues ?To page the attending provider between 7A-7P or the covering provider during after hours 7P-7A, please log into the web site [email or web address] and access using universal [place] password for that web site. If you do not have the password, please call the hospital operator. ? ? ? ?06/14/2021, 10:15 PM  ? ? ?

## 2021-06-14 NOTE — Evaluation (Signed)
Physical Therapy Evaluation ?Patient Details ?Name: Emily Hicks ?MRN: [id number] ?DOB: 1985-07-31 ?Today's Date: 06/14/2021 ? ?History of Present Illness ? Pt is a 36 y.o. female admitted for atypical chest pain, SIRS, possible lupus flair.  PMH significant for GERD, MS, cholecystectomy (2015), HTN. ?  ?Clinical Impression ? Pt is a 36 y.o. female with above HPI. Pt is independent and lives with spouse and works full time job as an Airline pilot. Upon eval, pt is at baseline mobility level. Ambulated ~124ft without use of AD and modified independence, therapist providing assist for line management and monitoring of symptoms- mild chest discomfort reported- most chest pain with deep breathing per pt report. Recommend home with family support as needed. No further skilled PT needs identified. PT will sign off. Please reconsult if mobility status changes.    ? ?Recommendations for follow up therapy are one component of a multi-disciplinary discharge planning process, led by the attending physician.  Recommendations may be updated based on patient status, additional functional criteria and insurance authorization. ? ?Follow Up Recommendations No PT follow up ? ?  ?Assistance Recommended at Discharge PRN  ?Patient can return home with the following ? Assistance with cooking/housework;A little help with bathing/dressing/bathroom ? ?  ?Equipment Recommendations None recommended by PT  ?Recommendations for Other Services ?    ?  ?Functional Status Assessment Patient has had a recent decline in their functional status and demonstrates the ability to make significant improvements in function in a reasonable and predictable amount of time.  ? ?  ?Precautions / Restrictions Precautions ?Precautions: None ?Restrictions ?Weight Bearing Restrictions: No  ? ?  ? ?Mobility ? Bed Mobility ?Overal bed mobility: Independent ?  ?  ?  ?  ?  ?  ?General bed mobility comments: HOB elevated ?  ? ?Transfers ?Overall transfer level: Modified  independent ?Equipment used: None ?  ?  ?  ?  ?  ?  ?  ?  ?  ? ?Ambulation/Gait ?Ambulation/Gait assistance: Modified independent (Device/Increase time) ?Gait Distance (Feet): 160 Feet ?Assistive device: None ?Gait Pattern/deviations: Step-through pattern ?Gait velocity: decr ?  ?  ?General Gait Details: mild decrease in gait speed with horizontal/vertical head turns, no overt LOB observed. ? ?Stairs ?  ?  ?  ?  ?  ? ?Wheelchair Mobility ?  ? ?Modified Rankin (Stroke Patients Only) ?  ? ?  ? ?Balance Overall balance assessment: Mild deficits observed, not formally tested ?  ?  ?  ?  ?  ?  ?  ?  ?  ?  ?  ?  ?  ?  ?  ?  ?  ?  ?   ? ? ? ?Pertinent Vitals/Pain Pain Assessment ?Pain Assessment: 0-10 ?Pain Score: 2  ?Pain Location: chest ?Pain Descriptors / Indicators: Discomfort ?Pain Intervention(s): Monitored during session  ? ? ?Home Living Family/patient expects to be discharged to:: Private residence ?Living Arrangements: Spouse/significant other;Children (19 y.o. son) ?Available Help at Discharge: Family ?Type of Home: House ?Home Access: Stairs to enter ?Entrance Stairs-Rails: Left ?Entrance Stairs-Number of Steps: 3 ?  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 denies history of falls. Works full time as an Airline pilot  ?  ?Prior Function Prior Level of Function : Independent/Modified Independent;Driving ?  ?  ?  ?  ?  ?  ?Mobility Comments: no AD use ?ADLs Comments: States spouse assist with cleaning and most of cooking, sometimes requires  assist if having a flare and not feeling well, but overall independent with dressing, grooming, bathing, toileting ?  ? ? ?Hand Dominance  ?   ? ?  ?Extremity/Trunk Assessment  ? Upper Extremity Assessment ?Upper Extremity Assessment: Defer to OT evaluation ?  ? ?Lower Extremity Assessment ?Lower Extremity Assessment: Overall WFL for tasks assessed ?  ? ?Cervical / Trunk  Assessment ?Cervical / Trunk Assessment: Normal  ?Communication  ? Communication: No difficulties  ?Cognition Arousal/Alertness: Awake/alert ?Behavior During Therapy: [facility] for tasks assessed/performed ?Overall Cognitive Status: Within Functional Limits for tasks assessed ?  ?  ?  ?  ?  ?  ?  ?  ?  ?  ?  ?  ?  ?  ?  ?  ?  ?  ?  ? ?  ?General Comments   ? ?  ?Exercises    ? ?Assessment/Plan  ?  ?PT Assessment Patient does not need any further PT services  ?PT Problem List   ? ?   ?  ?PT Treatment Interventions     ? ?PT Goals (Current goals can be found in the Care Plan section)  ?Acute Rehab PT Goals ?Patient Stated Goal: Feel better and get back hoem to family ?PT Goal Formulation: With patient/family ?Time For Goal Achievement: 06/28/21 ?Potential to Achieve Goals: Good ? ?  ?Frequency   ?  ? ? ?Co-evaluation   ?  ?  ?  ?  ? ? ?  ?AM-PAC PT "6 Clicks" Mobility  ?Outcome Measure Help needed turning from your back to your side while in a flat bed without using bedrails?: None ?Help needed moving from lying on your back to sitting on the side of a flat bed without using bedrails?: None ?Help needed moving to and from a bed to a chair (including a wheelchair)?: None ?Help needed standing up from a chair using your arms (e.g., wheelchair or bedside chair)?: None ?Help needed to walk in hospital room?: None ?Help needed climbing 3-5 steps with a railing? : A Little ?6 Click Score: 23 ? ?  ?End of Session Equipment Utilized During Treatment: Gait belt ?Activity Tolerance: Patient tolerated treatment well ?Patient left: in bed;with call bell/phone within reach;with nursing/sitter in room;with family/visitor present ?Nurse Communication: Mobility status ?PT Visit Diagnosis: Unsteadiness on feet (R26.81) ?  ? ?Time: 5456-2563 ?PT Time Calculation (min) (ACUTE ONLY): 18 min ? ? ?Charges:   PT Evaluation ?$PT Eval Low Complexity: 1 Low ?  ?  ?   ? ?Lyman Speller., PT, DPT  ?Acute Rehabilitation Services  ?Office  [phone] ? ?06/14/2021, 9:09 AM ? ?

## 2021-06-15 DIAGNOSIS — I308 Other forms of acute pericarditis: Secondary | ICD-10-CM

## 2021-06-15 DIAGNOSIS — R000 Tachycardia, unspecified: Secondary | ICD-10-CM | POA: Diagnosis not present

## 2021-06-15 DIAGNOSIS — D849 Immunodeficiency, unspecified: Secondary | ICD-10-CM

## 2021-06-15 DIAGNOSIS — D649 Anemia, unspecified: Secondary | ICD-10-CM | POA: Diagnosis not present

## 2021-06-15 DIAGNOSIS — M329 Systemic lupus erythematosus, unspecified: Secondary | ICD-10-CM | POA: Diagnosis not present

## 2021-06-15 DIAGNOSIS — R079 Chest pain, unspecified: Secondary | ICD-10-CM | POA: Diagnosis not present

## 2021-06-15 DIAGNOSIS — M3212 Pericarditis in systemic lupus erythematosus: Secondary | ICD-10-CM | POA: Diagnosis not present

## 2021-06-15 LAB — C3 COMPLEMENT: C3 Complement: 129 mg/dL (ref 82–167)

## 2021-06-15 LAB — CREATININE, SERUM
Creatinine, Ser: 0.63 mg/dL (ref 0.44–1.00)
GFR, Estimated: >60 mL/min (ref >60)

## 2021-06-15 LAB — PROCALCITONIN: Procalcitonin: <0.1 ng/mL

## 2021-06-15 LAB — C4 COMPLEMENT: Complement C4, Body Fluid: 11 mg/dL — ABNORMAL LOW (ref 12–38)

## 2021-06-15 LAB — TROPONIN I (HIGH SENSITIVITY): Troponin I (High Sensitivity): 10 ng/L (ref <18)

## 2021-06-15 LAB — T3: T3, Total: 142 ng/dL (ref 71–180)

## 2021-06-15 MED ORDER — AMLODIPINE BESYLATE 5 MG PO TABS: 5.0000 mg | ORAL_TABLET | Freq: Every day | ORAL | Status: DC

## 2021-06-15 MED ORDER — METOPROLOL SUCCINATE ER 100 MG PO TB24: 100.0000 mg | ORAL_TABLET | Freq: Every day | ORAL | 3 refills | Status: DC

## 2021-06-15 MED ORDER — COLCHICINE 0.6 MG PO TABS
0.6000 mg | ORAL_TABLET | Freq: Two times a day (BID) | ORAL | Status: DC
  Administered 2021-06-15: 0.6 mg via ORAL
  Filled 2021-06-15: qty 1

## 2021-06-15 MED ORDER — COLCHICINE 0.6 MG PO TABS: 0.6000 mg | ORAL_TABLET | Freq: Two times a day (BID) | ORAL | 3 refills | Status: DC

## 2021-06-15 MED ORDER — METOPROLOL SUCCINATE ER 100 MG PO TB24
100.0000 mg | ORAL_TABLET | Freq: Every day | ORAL | Status: DC
  Administered 2021-06-15: 100 mg via ORAL
  Filled 2021-06-15: qty 1

## 2021-06-15 MED ORDER — IBUPROFEN 800 MG PO TABS: 800.0000 mg | ORAL_TABLET | Freq: Three times a day (TID) | ORAL | 0 refills | Status: AC

## 2021-06-15 NOTE — Discharge Summary (Signed)
?Discharge Summary ? ANGELEAH LABRAKE GEX:[id number] DOB: 01-09-1986 ? ?PCP: Wells Guiles, DO ? ?Admit date: 06/13/2021 ?Discharge date: 06/15/2021 ? ?Time spent:  41mns ? ?Recommendations for Outpatient Follow-up:  ?F/u with PCP within a week  for hospital discharge follow up, repeat cbc/bmp at follow up ?F/u with rheumatology next week ?F/u with cardiology in three months ? ?Need to repeat cbc/bmp at follow up ?Please follow-up on final blood culture result, no growth at discharge ? ?New meds:  ?- Ibuprofen 800 mg TID will take for two weeks; with plans to extend to four week if persistent pain ?- Started on colchicine 0.6 mg BID with weight of 84 kg for  3 months ?- metoprolol succinate 100 mg ? ?Hold norvasc, Patient to check blood pressure at home, further bp meds adjustment per pcp ? ? ?  ? ?Discharge Diagnoses:  ?Active Hospital Problems  ? Diagnosis Date Noted  ? Chest pain 06/13/2021  ?  Priority: 1.  ? Sepsis ([place] 06/13/2021  ?  Priority: 2.  ? SLE (systemic lupus erythematosus related syndrome) ([place] 08/16/2018  ?  Priority: 3.  ? Normocytic anemia   ?  Priority: 5.  ? Essential hypertension 09/19/2018  ? Tachycardia 07/16/2018  ?  ?Resolved Hospital Problems  ?No resolved problems to display.  ? ? ?Discharge Condition: stable ? ?Diet recommendation: heart healthy ? ?Filed Weights  ? 06/13/21 1023  ?Weight: 83.9 kg  ? ? ?History of present illness: (Per admitting MD Dr. DRoel Cluck ? ?JMIKAIA JANVIERis a 36y.o. female with medical history significant of lupus hypertension ?  ?  ?Presented with chest pain starting from yesterday and lasted all day ?Presents for of left-sided chest pain radiating to his left arm constant since last night worse with sitting up and positional changes has a bit of shortness of breath for that and deep breathing makes it worse.  No cough or fever.  No nausea no vomiting no prior history of DVT or PE.  She is chronically tachycardic.  No known history of CAD reports of  palpitations. ?Patient has history of lupus for which she takes Plaquenil and prednisone ? She has been on a medication in the past to control her HR but go off it and the tachycardia has came back ?No fever  ?No tobacco no ETOH ?  ?She came off Prednisone taper 2 wks ago ? ?Hospital Course:  ?Principal Problem: ?  Chest pain ?Active Problems: ?  [place]epsis ([place] ?  SLE (systemic [place]matosus [place] syndrome) [place] ?  Normocytic anemia ?  Tachycardia ?  Essential hypertension ? ? ?Assessment and Plan: ? ?Chest pain with sinus tachycardia ?-Troponin negative ?-CTA no PE, showed mild bibasilar subsegmental atelectasis but no acute infiltrate, bilateral lower extremity doppler negative for DVT ?-echo unremarkable  ?-PR depression on EKG ?-Has elevated ESR CRP in the setting of lupus concern for pericarditis, received 1 dose of steroid in the ED ,cardiology consulted and recommended: ? ?- Ibuprofen 800 mg TID will take for two weeks; with plans to extend to four week if persistent pain ?- Started on colchicine 0.6 mg BID with weight of 84 kg for  3 months ?- metoprolol succinate 100 mg ? ?-Also appears to have chest wall tenderness, cannot rule out costochondritis  ?-has much improved, desire to go home, she is cleared by cardiology to discharge home, cardiology will arrange follow-up ?-She is also advised to follow-up with her rheumatologist next week ?-Need to repeat CBC BMP next week ?  ?  ?  Fever, leukocytosis, sinus tachycardia ?-Need to rule out infection in an immunosuppressed individual ?-UA concentrated but no sign of infection, chest imaging no acute infiltrate, respiratory viral panel negative, MRSA screening negative ?-she denies sinus issues, no ear pain, no oral thrush, no dysphagia, no diarrhea, no stomach pain ?--Fever resolved, blood culture no growth so far, sepsis ruled out, discontinue antibiotics ? ?  ?  ?Normocytic anemia ?Does not appear to have overt external signs of bleeding ?F/u with  pcp ? ?  ?SLE/immunosuppressed status ?-resume all home meds ?-Follows rheumatologist ?  ?  ? Body mass index is 34.96 kg/m?.,  meet Obesity criteria ?  ?. ?Discharge Exam: ?BP (!) 138/98 (BP Location: Right Arm)   Pulse (!) 108   Temp 98.2 ?F (36.8 ?C) (Oral)   Resp 17   Ht '5\' 1"'  (1.549 m)   Wt 83.9 kg   SpO2 100%   BMI 34.96 kg/m?  ? ?General: NAD, pleasant ?Cardiovascular: Sinus tachycardia has much improved ?Respiratory: Normal respiratory effort ? ? ? ?Discharge Instructions   ? ? Ambulatory referral to Cardiology   Complete by: As directed ?  ? If you have not heard from the Cardiology office within the next 72 hours please call [phone].  ? Diet general   Complete by: As directed ?  ? Increase activity slowly   Complete by: As directed ?  ? ?  ? ?Allergies as of 06/15/2021   ?No Known Allergies ?  ? ?  ?Medication List  ?  ? ?STOP taking these medications   ? ?medroxyPROGESTERone 150 MG/ML injection ?Commonly known as: DEPO-PROVERA ?  ? ?  ? ?TAKE these medications   ? ?amLODipine 5 MG tablet ?Commonly known as: NORVASC ?Take 1 tablet (5 mg total) by mouth daily. Hold this medication for now, please check your blood pressure daily, bring record for your pcp to review, further meds adjustment for blood pressure per pcp ?What changed: additional instructions ?  ?clobetasol ointment 0.05 % ?Commonly known as: TEMOVATE ?Apply 1 application. topically 2 (two) times daily. ?  ?clobetasol 0.05 % external solution ?Commonly known as: TEMOVATE ?Apply 1 application. topically 2 (two) times daily. ?  ?colchicine 0.6 MG tablet ?Take 1 tablet (0.6 mg total) by mouth 2 (two) times daily. ?  ?diphenhydrAMINE 25 mg capsule ?Commonly known as: BENADRYL ?Take 25 mg by mouth every 6 (six) hours as needed for itching. ?  ?hydrocortisone 2.5 % ointment ?Apply 1 application. topically daily as needed (rash). ?  ?hydroxychloroquine 200 MG tablet ?Commonly known as: PLAQUENIL ?Take 200 mg by mouth daily. ?  ?ibuprofen 800  MG tablet ?Commonly known as: ADVIL ?Take 1 tablet (800 mg total) by mouth every 8 (eight) hours for 14 days. ?What changed:  ?medication strength ?how much to take ?when to take this ?reasons to take this ?  ?loratadine 10 MG tablet ?Commonly known as: CLARITIN ?Take 10 mg by mouth daily as needed for allergies. ?  ?metoprolol succinate 100 MG 24 hr tablet ?Commonly known as: TOPROL-XL ?Take 1 tablet (100 mg total) by mouth daily. Take with or immediately following a meal. ?  ?mycophenolate 500 MG tablet ?Commonly known as: CELLCEPT ?Take 500 mg by mouth 2 (two) times daily. ?  ?olmesartan 20 MG tablet ?Commonly known as: BENICAR ?Take 1 tablet (20 mg total) by mouth at bedtime. ?  ?Saphnelo 300 MG/2ML Soln ?Generic drug: Anifrolumab-fnia ?Inject 300 mg into the vein every 30 (thirty) days. ?  ? ?  ? ?No  Known Allergies ? Follow-up Information   ? ? Wells Guiles, DO Follow up.   ?Specialty: Family Medicine ?Contact information: ?[address] ?[place] [address] ?[phone] ? ? ?  ?  ? ? Lahoma Rocker, MD Follow up in 1 week(s).   ?Specialty: Rheumatology ?Why: hospital discharge follow up ?Contact information: ?[address] ?[place] [address] ?[phone] ? ? ?  ?  ? ? Werner Lean, MD Follow up on 09/06/2021.   ?Specialty: Cardiology ?Why: Cardiology Hospital Follow-up on 09/06/2021 at 10:00 AM. ?Contact information: ?[address] ?[place] [address] ?[phone] ? ? ?  ?  ? ?  ?  ? ?  ? ? ? ?The results of significant diagnostics from this hospitalization (including imaging, microbiology, ancillary and laboratory) are listed below for reference.   ? ?Significant Diagnostic Studies: ?DG Chest 2 View ? ?Result Date: 06/13/2021 ?CLINICAL DATA:  Chest pain and shortness of breath. History of lupus. EXAM: CHEST - 2 VIEW COMPARISON:  05/20/2021. FINDINGS: Stable cardiomediastinal contours. The lungs are suboptimally inflated. Blunting of the posterior costophrenic  angles may reflect small effusions. Mild subsegmental atelectasis noted in the lung bases. No airspace consolidation. IMPRESSION: 1. Low lung volumes. 2. Suspect small pleural effusions. Electronically Signed   By: Kerby Moors

## 2021-06-15 NOTE — Progress Notes (Addendum)
[facility] Note ? ?Progress Note ? ?Patient Name: Emily Hicks ?Date of Encounter: 06/15/2021 ? ?Primary Cardiologist: New to Dr. Izora Ribas ? ?Subjective  ? ?Patient notes pain has improved since prior.  Had one episodes overnight.  Is able to lay flat. ?Heart rate has improved with improved palpitations. ?Echo with no WMA ? ?Inpatient Medications  ?  ?Scheduled Meds: ? colchicine  0.6 mg Oral BID  ? ibuprofen  800 mg Oral Q8H  ? metoprolol tartrate  25 mg Oral BID  ? senna-docusate  1 tablet Oral BID  ? ?Continuous Infusions: ? ceFEPime (MAXIPIME) IV Stopped (06/15/21 0542)  ? metronidazole Stopped (06/14/21 2141)  ? vancomycin Stopped (06/14/21 2251)  ? ?PRN Meds: ?acetaminophen **OR** acetaminophen, HYDROcodone-acetaminophen, loratadine, morphine injection  ? ?Vital Signs  ?  ?Vitals:  ? 06/14/21 1146 06/14/21 1300 06/14/21 2300 06/15/21 0325  ?BP: (!) 139/92 139/85 133/69 123/81  ?Pulse:  (!) 103 (!) 110 98  ?Resp:  (!) 26 (!) 23   ?Temp:  98 ?F (36.7 ?C) 98.9 ?F (37.2 ?C) 98.3 ?F (36.8 ?C)  ?TempSrc:  Oral Oral Oral  ?SpO2:  99% 99% 99%  ?Weight:      ?Height:      ? ? ?Intake/Output Summary (Last 24 hours) at 06/15/2021 0727 ?Last data filed at 06/15/2021 0600 ?Gross per 24 hour  ?Intake 2183.37 ml  ?Output --  ?Net 2183.37 ml  ? ?Filed Weights  ? 06/13/21 1023  ?Weight: 83.9 kg  ? ? ?Telemetry  ?  ?Sinus Rhythm to sinus tachycardia - Personally Reviewed ? ?Physical Exam  ? ?Gen: no distress ?Neck: No JVD ?Cardiac: No Rubs or Gallops, no Murmur, RRR + 2 radial pulses ?Respiratory: Clear to auscultation bilaterally, normal effort, normal  respiratory rate ?GI: Soft, nontender, non-distended  ?MS: No  edema;  moves all extremities ?Integument: Skin feels warm, no change in facial rash ?Neuro:  At time of evaluation, alert and oriented to person/place/time/situation  ?Psych: Normal affect, patient feels well ? ? ?Labs  ?  ?Chemistry ?Recent Labs  ?Lab 06/13/21 ?1206 06/13/21 ?2150 06/14/21 ?0324  06/15/21 ?0016  ?NA 140  --  142  --   ?K 3.8  --  3.8  --   ?CL 109  --  118*  --   ?CO2 22  --  19*  --   ?GLUCOSE 95  --  162*  --   ?BUN 14  --  12  --   ?CREATININE 0.62  --  0.63 0.63  ?CALCIUM 8.9  --  9.0  --   ?PROT  --  7.4 6.6  --   ?ALBUMIN  --  3.2* 2.8*  --   ?AST  --  19 17  --   ?ALT  --  17 16  --   ?ALKPHOS  --  71 61  --   ?BILITOT  --  0.9 0.7  --   ?GFRNONAA >60  --  >60 >60  ?ANIONGAP 9  --  5  --   ?  ? ?Hematology ?Recent Labs  ?Lab 06/13/21 ?1141 06/13/21 ?2146 06/13/21 ?2150 06/14/21 ?0324  ?WBC 11.7*  --  11.7* 12.3*  ?RBC 3.64* 3.46* 3.59* 3.32*  ?HGB 10.1*  --  9.6* 9.0*  ?HCT 31.5*  --  31.3* 29.3*  ?MCV 86.5  --  87.2 88.3  ?MCH 27.7  --  26.7 27.1  ?MCHC 32.1  --  30.7 30.7  ?RDW 13.8  --  13.9 13.8  ?  PLT 354  --  337 305  ? ? ?Cardiac EnzymesNo results for input(s): TROPONINI in the last 168 hours. No results for input(s): TROPIPOC in the last 168 hours.  ? ?BNP ?Recent Labs  ?Lab 06/13/21 ?1142  ?BNP 20.8  ?  ? ?DDimer  ?Recent Labs  ?Lab 06/13/21 ?1141  ?DDIMER 3.82*  ?  ? ?Radiology  ?  ?DG Chest 2 View ? ?Result Date: 06/13/2021 ?CLINICAL DATA:  Chest pain and shortness of breath. History of lupus. EXAM: CHEST - 2 VIEW COMPARISON:  05/20/2021. FINDINGS: Stable cardiomediastinal contours. The lungs are suboptimally inflated. Blunting of the posterior costophrenic angles may reflect small effusions. Mild subsegmental atelectasis noted in the lung bases. No airspace consolidation. IMPRESSION: 1. Low lung volumes. 2. Suspect small pleural effusions. Electronically Signed   By: Signa Kell M.D.   On: 06/13/2021 10:46  ? ?CT Angio Chest PE W and/or Wo Contrast ? ?Result Date: 06/13/2021 ?CLINICAL DATA:  Chest pain, shortness of breath. EXAM: CT ANGIOGRAPHY CHEST WITH CONTRAST TECHNIQUE: Multidetector CT imaging of the chest was performed using the standard protocol during bolus administration of intravenous contrast. Multiplanar CT image reconstructions and MIPs were obtained to  evaluate the vascular anatomy. RADIATION DOSE REDUCTION: This exam was performed according to the departmental dose-optimization program which includes automated exposure control, adjustment of the mA and/or kV according to patient size and/or use of iterative reconstruction technique. CONTRAST:  OMNIPAQUE IOHEXOL 350 MG/ML SOLN COMPARISON:  April 20, 2021. FINDINGS: Cardiovascular: Satisfactory opacification of the pulmonary arteries to the segmental level. No evidence of pulmonary embolism. Mild cardiomegaly is noted. No pericardial effusion. Mediastinum/Nodes: No enlarged mediastinal, hilar, or axillary lymph nodes. Thyroid gland, trachea, and esophagus demonstrate no significant findings. Lungs/Pleura: No pneumothorax or pleural effusion is noted. Mild bibasilar subsegmental atelectasis is noted. Upper Abdomen: No acute abnormality. Musculoskeletal: No chest wall abnormality. No acute or significant osseous findings. Review of the MIP images confirms the above findings. IMPRESSION: No definite evidence of pulmonary embolus is noted. Mild bibasilar subsegmental atelectasis is noted. Electronically Signed   By: Lupita Raider M.D.   On: 06/13/2021 15:54  ? ?ECHOCARDIOGRAM COMPLETE ? ?Result Date: 06/14/2021 ?   ECHOCARDIOGRAM REPORT   Patient Name:   Emily Hicks Date of Exam: 06/14/2021 Medical Rec #:  [id number]     Height:       61.0 in Accession #:    [id number]    Weight:       185.0 lb Date of Birth:  03/06/1985     BSA:          1.827 m? Patient Age:    36 years      BP:           146/97 mmHg Patient Gender: F             HR:           104 bpm. Exam Location:  Inpatient Procedure: 2D Echo, Cardiac Doppler and Color Doppler Indications:    Chest pain  History:        Patient has prior history of Echocardiogram examinations, most                 recent 08/02/2018. Risk Factors:Hypertension.  Sonographer:    Eduard Roux Referring Phys: [id number] ANASTASSIA DOUTOVA IMPRESSIONS  1. Left ventricular ejection  fraction, by estimation, is 60 to 65%. The left ventricle has normal function. The left ventricle has no regional wall motion abnormalities. Indeterminate  diastolic filling due to E-A fusion.  2. Right ventricular systolic function is normal. The right ventricular size is normal. There is normal pulmonary artery systolic pressure.  3. Left atrial size was mildly dilated.  4. Trivial mitral valve regurgitation.  5. The aortic valve is normal in structure. Aortic valve regurgitation is not visualized.  6. The inferior vena cava is normal in size with <50% respiratory variability, suggesting right atrial pressure of 8 mmHg. Comparison(s): No significant change from prior study. FINDINGS  Left Ventricle: Left ventricular ejection fraction, by estimation, is 60 to 65%. The left ventricle has normal function. The left ventricle has no regional wall motion abnormalities. The left ventricular internal cavity size was normal in size. There is  borderline left ventricular hypertrophy. Indeterminate diastolic filling due to E-A fusion. Right Ventricle: The right ventricular size is normal. No increase in right ventricular wall thickness. Right ventricular systolic function is normal. There is normal pulmonary artery systolic pressure. The tricuspid regurgitant velocity is 2.33 m/s, and  with an assumed right atrial pressure of 8 mmHg, the estimated right ventricular systolic pressure is 29.7 mmHg. Left Atrium: Left atrial size was mildly dilated. Right Atrium: Right atrial size was normal in size. Pericardium: There is no evidence of pericardial effusion. Mitral Valve: Trivial mitral valve regurgitation. Tricuspid Valve: The tricuspid valve is normal in structure. Tricuspid valve regurgitation is trivial. Aortic Valve: The aortic valve is normal in structure. Aortic valve regurgitation is not visualized. Aortic valve peak gradient measures 13.2 mmHg. Pulmonic Valve: The pulmonic valve was normal in structure. Pulmonic valve  regurgitation is not visualized. Aorta: The aortic root and ascending aorta are structurally normal, with no evidence of dilitation. Venous: The inferior vena cava is normal in size with less than 50% respirato

## 2021-06-15 NOTE — Plan of Care (Signed)
PIV removed. Pt belongings gathered by pt and family. DC paperwork reviewed. ? ? ?Problem: Education: ?Goal: Knowledge of General Education information will improve ?Description: Including pain rating scale, medication(s)/side effects and non-pharmacologic comfort measures ?Outcome: Completed/Met ?  ?Problem: Health Behavior/Discharge Planning: ?Goal: Ability to manage health-related needs will improve ?Outcome: Completed/Met ?  ?Problem: Clinical Measurements: ?Goal: Ability to maintain clinical measurements within normal limits will improve ?Outcome: Completed/Met ?Goal: Will remain free from infection ?Outcome: Completed/Met ?Goal: Diagnostic test results will improve ?Outcome: Completed/Met ?Goal: Respiratory complications will improve ?Outcome: Completed/Met ?Goal: Cardiovascular complication will be avoided ?Outcome: Completed/Met ?  ?Problem: Activity: ?Goal: Risk for activity intolerance will decrease ?Outcome: Completed/Met ?  ?Problem: Nutrition: ?Goal: Adequate nutrition will be maintained ?Outcome: Completed/Met ?  ?Problem: Coping: ?Goal: Level of anxiety will decrease ?Outcome: Completed/Met ?  ?Problem: Elimination: ?Goal: Will not experience complications related to bowel motility ?Outcome: Completed/Met ?Goal: Will not experience complications related to urinary retention ?Outcome: Completed/Met ?  ?Problem: Pain Managment: ?Goal: General experience of comfort will improve ?Outcome: Completed/Met ?  ?Problem: Safety: ?Goal: Ability to remain free from injury will improve ?Outcome: Completed/Met ?  ?Problem: Skin Integrity: ?Goal: Risk for impaired skin integrity will decrease ?Outcome: Completed/Met ?  ?

## 2021-06-16 ENCOUNTER — Encounter (HOSPITAL_COMMUNITY): Payer: Self-pay

## 2021-06-16 ENCOUNTER — Other Ambulatory Visit: Payer: Self-pay

## 2021-06-16 ENCOUNTER — Emergency Department (HOSPITAL_COMMUNITY)
Admission: EM | Admit: 2021-06-16 | Discharge: 2021-06-16 | Disposition: A | Discharge status: Home / Self Care | Payer: 59 | Attending: Emergency Medicine | Admitting: Emergency Medicine

## 2021-06-16 ENCOUNTER — Emergency Department (HOSPITAL_COMMUNITY): Payer: 59

## 2021-06-16 DIAGNOSIS — R0781 Pleurodynia: Secondary | ICD-10-CM

## 2021-06-16 DIAGNOSIS — M3213 Lung involvement in systemic lupus erythematosus: Secondary | ICD-10-CM

## 2021-06-16 DIAGNOSIS — R0602 Shortness of breath: Secondary | ICD-10-CM | POA: Diagnosis present

## 2021-06-16 DIAGNOSIS — R000 Tachycardia, unspecified: Secondary | ICD-10-CM | POA: Diagnosis not present

## 2021-06-16 LAB — CBC WITH DIFFERENTIAL/PLATELET
Abs Immature Granulocytes: 0.11 10*3/uL — ABNORMAL HIGH (ref 0.00–0.07)
Basophils Absolute: 0 10*3/uL (ref 0.0–0.1)
Basophils Relative: 0 %
Eosinophils Absolute: 0 10*3/uL (ref 0.0–0.5)
Eosinophils Relative: 0 %
HCT: 31.9 % — ABNORMAL LOW (ref 36.0–46.0)
Hemoglobin: 10.1 g/dL — ABNORMAL LOW (ref 12.0–15.0)
Immature Granulocytes: 1 %
Lymphocytes Relative: 9 %
Lymphs Abs: 0.8 10*3/uL (ref 0.7–4.0)
MCH: 27.4 pg (ref 26.0–34.0)
MCHC: 31.7 g/dL (ref 30.0–36.0)
MCV: 86.4 fL (ref 80.0–100.0)
Monocytes Absolute: 0.6 10*3/uL (ref 0.1–1.0)
Monocytes Relative: 6 %
Neutro Abs: 7.5 10*3/uL (ref 1.7–7.7)
Neutrophils Relative %: 84 %
Platelets: 352 10*3/uL (ref 150–400)
RBC: 3.69 MIL/uL — ABNORMAL LOW (ref 3.87–5.11)
RDW: 14.2 % (ref 11.5–15.5)
WBC: 9 10*3/uL (ref 4.0–10.5)
nRBC: 0 % (ref 0.0–0.2)

## 2021-06-16 LAB — COMPREHENSIVE METABOLIC PANEL
ALT: 17 U/L (ref 0–44)
AST: 16 U/L (ref 15–41)
Albumin: 3.2 g/dL — ABNORMAL LOW (ref 3.5–5.0)
Alkaline Phosphatase: 64 U/L (ref 38–126)
Anion gap: 6 (ref 5–15)
BUN: 15 mg/dL (ref 6–20)
CO2: 20 mmol/L — ABNORMAL LOW (ref 22–32)
Calcium: 9.5 mg/dL (ref 8.9–10.3)
Chloride: 115 mmol/L — ABNORMAL HIGH (ref 98–111)
Creatinine, Ser: 0.5 mg/dL (ref 0.44–1.00)
GFR, Estimated: >60 mL/min (ref >60)
Glucose, Bld: 108 mg/dL — ABNORMAL HIGH (ref 70–99)
Potassium: 4.4 mmol/L (ref 3.5–5.1)
Sodium: 141 mmol/L (ref 135–145)
Total Bilirubin: 0.4 mg/dL (ref 0.3–1.2)
Total Protein: 7.9 g/dL (ref 6.5–8.1)

## 2021-06-16 LAB — URINALYSIS, ROUTINE W REFLEX MICROSCOPIC
Bilirubin Urine: NEGATIVE
Glucose, UA: NEGATIVE mg/dL
Hgb urine dipstick: NEGATIVE
Ketones, ur: NEGATIVE mg/dL
Leukocytes,Ua: NEGATIVE
Nitrite: NEGATIVE
Protein, ur: NEGATIVE mg/dL
Specific Gravity, Urine: 1.021 (ref 1.005–1.030)
pH: 6 (ref 5.0–8.0)

## 2021-06-16 LAB — TROPONIN I (HIGH SENSITIVITY)
Troponin I (High Sensitivity): 7 ng/L (ref <18)
Troponin I (High Sensitivity): 7 ng/L (ref <18)

## 2021-06-16 MED ORDER — METHOCARBAMOL 500 MG PO TABS
1000.0000 mg | ORAL_TABLET | Freq: Three times a day (TID) | ORAL | 0 refills | Status: DC | PRN
Start: 2021-06-16 — End: 2021-08-15

## 2021-06-16 MED ORDER — MORPHINE SULFATE (PF) 4 MG/ML IV SOLN
4.0000 mg | Freq: Once | INTRAVENOUS | Status: AC
  Administered 2021-06-16: 4 mg via INTRAVENOUS
  Filled 2021-06-16: qty 1

## 2021-06-16 MED ORDER — OXYCODONE-ACETAMINOPHEN 5-325 MG PO TABS
1.0000 | ORAL_TABLET | Freq: Once | ORAL | Status: AC
  Administered 2021-06-16: 1 via ORAL
  Filled 2021-06-16: qty 1

## 2021-06-16 MED ORDER — SIMETHICONE 40 MG/0.6ML PO SUSP
40.0000 mg | Freq: Once | ORAL | Status: AC
  Administered 2021-06-16: 40 mg via ORAL
  Filled 2021-06-16: qty 0.6

## 2021-06-16 MED ORDER — METOPROLOL SUCCINATE ER 50 MG PO TB24
100.0000 mg | ORAL_TABLET | Freq: Every day | ORAL | Status: DC
  Administered 2021-06-16: 100 mg via ORAL
  Filled 2021-06-16: qty 2

## 2021-06-16 MED ORDER — METHOCARBAMOL 500 MG PO TABS
1000.0000 mg | ORAL_TABLET | Freq: Once | ORAL | Status: AC
  Administered 2021-06-16: 1000 mg via ORAL
  Filled 2021-06-16: qty 2

## 2021-06-16 MED ORDER — COLCHICINE 0.6 MG PO TABS
0.6000 mg | ORAL_TABLET | Freq: Once | ORAL | Status: AC
  Administered 2021-06-16: 0.6 mg via ORAL
  Filled 2021-06-16: qty 1

## 2021-06-16 MED ORDER — OXYCODONE-ACETAMINOPHEN 5-325 MG PO TABS: 1.0000 | ORAL_TABLET | Freq: Four times a day (QID) | ORAL | 0 refills | Status: DC | PRN

## 2021-06-16 MED ORDER — KETOROLAC TROMETHAMINE 15 MG/ML IJ SOLN
15.0000 mg | Freq: Once | INTRAMUSCULAR | Status: AC
  Administered 2021-06-16: 15 mg via INTRAVENOUS
  Filled 2021-06-16: qty 1

## 2021-06-16 NOTE — ED Notes (Signed)
Patient ambulated to the bathroom with assistance.

## 2021-06-16 NOTE — ED Triage Notes (Signed)
Pt reports with sharp back pains and SHOB after just recently being discharged from the hospital. Pt states that when she got home she started having pains in her shoulders and down her back that was so severe that she was unable to sit down.  ?

## 2021-06-16 NOTE — ED Provider Notes (Signed)
? [facility]Provider Note ? ? ?CSN: [id number] ?Arrival date & time: 06/16/21  0136 ? ?  ? ?History ? ?Chief Complaint  ?Patient presents with  ? Back Pain  ? Shortness of Breath  ? ? ?Emily Hicks is a 36 y.o. female. ? ?The history is provided by the patient and medical records.  ?Back Pain ?Shortness of Breath ?Emily Hicks is a 36 y.o. female who presents to the Emergency Department complaining of back pain.  She has a history of lupus, MS and is on chronic immunosuppression.  She was admitted to the hospital on Friday for chest pain and discharged home on Saturday morning.  She was feeling improved at time of hospital discharge.  Shortly thereafter she developed severe left-sided back pain that goes to her shoulders.  Her pain is migratory in nature and will switch from one shoulder to the next and then back to her mid back.  She was thinking it might be gas.  She describes it as a sharp pain.  She has pain with coughing and breathing.  No abdominal pain, nausea, vomiting, numbness, weakness. ? ?She was treated for pericarditis thought to be secondary to lupus after evaluation by cardiology and with echo.  She had a negative CTA during her admission.  She was discharged home on 800 mg ibuprofen 3 times daily as well as colchicine twice daily.  She got her morning medicines on Saturday but was unable to get her evening medicines due to the pharmacy being closed.  She did have a fever when she was admitted to the hospital but she has not had any since then.  She is currently taking CellCept, Plaquenil,Saphnelo infusion (first treatment May3).   ?  ? ?Home Medications ?Prior to Admission medications   ?Medication Sig Start Date End Date Taking? Authorizing Provider  ?Anifrolumab-fnia (SAPHNELO) 300 MG/2ML SOLN Inject 300 mg into the vein every 30 (thirty) days. 06/03/21  Yes [provider]  ?clobetasol ointment (TEMOVATE) 0.05 % Apply 1 application. topically 2 (two)  times daily. 06/04/21  Yes [provider]  ?colchicine 0.6 MG tablet Take 1 tablet (0.6 mg total) by mouth 2 (two) times daily. 06/15/21 10/13/21 Yes Albertine Grates, MD  ?diphenhydrAMINE (BENADRYL) 25 mg capsule Take 25 mg by mouth every 6 (six) hours as needed for itching.   Yes [provider]  ?hydroxychloroquine (PLAQUENIL) 200 MG tablet Take 200 mg by mouth daily.   Yes [provider]  ?loratadine (CLARITIN) 10 MG tablet Take 10 mg by mouth daily as needed for allergies.   Yes [provider]  ?methocarbamol (ROBAXIN) 500 MG tablet Take 2 tablets (1,000 mg total) by mouth 3 (three) times daily as needed for muscle spasms. 06/16/21  Yes Tilden Fossa, MD  ?metoprolol succinate (TOPROL-XL) 100 MG 24 hr tablet Take 1 tablet (100 mg total) by mouth daily. Take with or immediately following a meal. 06/15/21  Yes Albertine Grates, MD  ?mycophenolate (CELLCEPT) 500 MG tablet Take 500 mg by mouth 2 (two) times daily.   Yes [provider]  ?olmesartan (BENICAR) 20 MG tablet Take 1 tablet (20 mg total) by mouth at bedtime. 05/20/21  Yes Sabino Dick, DO  ?oxyCODONE-acetaminophen (PERCOCET/ROXICET) 5-325 MG tablet Take 1 tablet by mouth every 6 (six) hours as needed for severe pain. 06/16/21  Yes Tilden Fossa, MD  ?amLODipine (NORVASC) 5 MG tablet Take 1 tablet (5 mg total) by mouth daily. Hold this medication for now, please check your  blood pressure daily, bring record for your pcp to review, further meds adjustment for blood pressure per pcp ?Patient not taking: Reported on 06/16/2021 06/15/21   Albertine Grates, MD  ?ibuprofen (ADVIL) 800 MG tablet Take 1 tablet (800 mg total) by mouth every 8 (eight) hours for 14 days. ?Patient not taking: Reported on 06/16/2021 06/15/21 06/29/21  Albertine Grates, MD  ?   ? ?Allergies    ?Patient has no known allergies.   ? ?Review of Systems   ?Review of Systems  ?Respiratory:  Positive for shortness of breath.   ?Musculoskeletal:  Positive for back pain.  ?All  other systems reviewed and are negative. ? ?Physical Exam ?Updated Vital Signs ?BP (!) 147/95 (BP Location: Right Arm)   Pulse (!) 120   Temp 98 ?F (36.7 ?C) (Oral)   Resp 19   Ht 5\' 1"  (1.549 m)   Wt 83.9 kg   SpO2 98%   BMI 34.96 kg/m?  ?Physical Exam ?Vitals and nursing note reviewed.  ?Constitutional:   ?   General: She is in acute distress.  ?   Appearance: She is well-developed.  ?   Comments: Uncomfortable appearing  ?HENT:  ?   Head: Normocephalic and atraumatic.  ?Cardiovascular:  ?   Rate and Rhythm: Regular rhythm. Tachycardia present.  ?   Heart sounds: No murmur heard. ?Pulmonary:  ?   Effort: Pulmonary effort is normal. No respiratory distress.  ?   Breath sounds: Normal breath sounds.  ?Abdominal:  ?   Palpations: Abdomen is soft.  ?   Tenderness: There is no abdominal tenderness. There is no guarding or rebound.  ?Musculoskeletal:     ?   General: No tenderness.  ?   Comments: Mild edema to all four extremities. 2+ DP pulses bilaterally  ?Skin: ?   General: Skin is warm and dry.  ?Neurological:  ?   Mental Status: She is alert and oriented to person, place, and time.  ?   Comments: 5 out of 5 strength in all 4 extremities  ?Psychiatric:     ?   Behavior: Behavior normal.  ? ? ?ED Results / Procedures / Treatments   ?Labs ?(all labs ordered are listed, but only abnormal results are displayed) ?Labs Reviewed  ?COMPREHENSIVE METABOLIC PANEL - Abnormal; Notable for the following components:  ?    Result Value  ? Chloride 115 (*)   ? CO2 20 (*)   ? Glucose, Bld 108 (*)   ? Albumin 3.2 (*)   ? All other components within normal limits  ?CBC WITH DIFFERENTIAL/PLATELET - Abnormal; Notable for the following components:  ? RBC 3.69 (*)   ? Hemoglobin 10.1 (*)   ? HCT 31.9 (*)   ? Abs Immature Granulocytes 0.11 (*)   ? All other components within normal limits  ?URINALYSIS, ROUTINE W REFLEX MICROSCOPIC  ?TROPONIN I (HIGH SENSITIVITY)  ?TROPONIN I (HIGH SENSITIVITY)  ? ? ?EKG ?EKG  Interpretation ? ?Date/Time:  Sunday Jun 16 2021 01:50:09 EDT ?Ventricular Rate:  128 ?PR Interval:  139 ?QRS Duration: 89 ?QT Interval:  295 ?QTC Calculation: 431 ?R Axis:   45 ?Text Interpretation: Age not entered, assumed to be  36 years old for purpose of ECG interpretation Sinus tachycardia Borderline T wave abnormalities Borderline ST elevation, anterolateral leads No significant change since last tracing Confirmed by Tilden Fossa [phone]) on 06/16/2021 2:12:42 AM ? ?Radiology ?DG Chest 2 View ? ?Result Date: 06/16/2021 ?CLINICAL DATA:  Chest pain EXAM: CHEST - 2  VIEW COMPARISON:  Three days ago FINDINGS: Low volume chest with streaky density at the left more than right base. Small left pleural effusion. Stable generous heart size. No pneumothorax. IMPRESSION: Left more than right lower lobe atelectasis with small left pleural effusion. Electronically Signed   By: Tiburcio Pea M.D.   On: 06/16/2021 05:16  ? ?ECHOCARDIOGRAM COMPLETE ? ?Result Date: 06/14/2021 ?   ECHOCARDIOGRAM REPORT   Patient Name:   Emily Hicks Date of Exam: 06/14/2021 Medical Rec #:  [id number]     Height:       61.0 in Accession #:    [id number]    Weight:       185.0 lb Date of Birth:  12/12/1985     BSA:          1.827 m? Patient Age:    35 years      BP:           146/97 mmHg Patient Gender: F             HR:           104 bpm. Exam Location:  Inpatient Procedure: 2D Echo, Cardiac Doppler and Color Doppler Indications:    Chest pain  History:        Patient has prior history of Echocardiogram examinations, most                 recent 08/02/2018. Risk Factors:Hypertension.  Sonographer:    Eduard Roux Referring Phys: [id number] ANASTASSIA DOUTOVA IMPRESSIONS  1. Left ventricular ejection fraction, by estimation, is 60 to 65%. The left ventricle has normal function. The left ventricle has no regional wall motion abnormalities. Indeterminate diastolic filling due to E-A fusion.  2. Right ventricular systolic function is normal. The right  ventricular size is normal. There is normal pulmonary artery systolic pressure.  3. Left atrial size was mildly dilated.  4. Trivial mitral valve regurgitation.  5. The aortic valve is normal in structure. Aortic valve regurgitation is no

## 2021-06-18 LAB — CULTURE, BLOOD (ROUTINE X 2)
Culture: NO GROWTH
Special Requests: ADEQUATE

## 2021-06-26 ENCOUNTER — Ambulatory Visit: Payer: 59 | Admitting: Student

## 2021-06-26 ENCOUNTER — Encounter: Payer: Self-pay | Admitting: Student

## 2021-06-26 VITALS — BP 107/70 | HR 95 | Ht 61.0 in | Wt 174.8 lb

## 2021-06-26 DIAGNOSIS — Z0000 Encounter for general adult medical examination without abnormal findings: Secondary | ICD-10-CM | POA: Diagnosis not present

## 2021-06-26 DIAGNOSIS — I10 Essential (primary) hypertension: Secondary | ICD-10-CM | POA: Diagnosis not present

## 2021-06-26 DIAGNOSIS — R079 Chest pain, unspecified: Secondary | ICD-10-CM | POA: Diagnosis not present

## 2021-06-26 DIAGNOSIS — D649 Anemia, unspecified: Secondary | ICD-10-CM | POA: Diagnosis not present

## 2021-06-26 NOTE — Assessment & Plan Note (Signed)
Patient declined bivalent COVID booster.

## 2021-06-26 NOTE — Assessment & Plan Note (Signed)
Resolved. Likely related to uncontrolled Lupus. Appropriately managed by rheumatology.

## 2021-06-26 NOTE — Progress Notes (Signed)
  SUBJECTIVE:   CHIEF COMPLAINT / HPI:   Patient presents for chest pain follow-up after being seen in the ED on 06/13/2021 suspect to be pericarditis secondary to lupus.  Discharged home on ibuprofen 8 mg 3 times daily and colchicine twice daily. States her chest pain has improved. She recently stopped Cellcept and started Azathioprine. Continuing Plaquenil and Saphnelo as well. She may be switching care for PCP needs to internal medicine.   PERTINENT  PMH / PSH: Lupus, MS, GERD  OBJECTIVE:  BP 107/70   Pulse 95   Ht 5\' 1"  (1.549 m)   Wt 174 lb 12.8 oz (79.3 kg)   SpO2 99%   BMI 33.03 kg/m   General: NAD, pleasant, able to participate in exam Cardiac: RRR, no murmurs auscultated. Respiratory: CTAB, normal effort, no wheezes, rales or rhonchi  ASSESSMENT/PLAN:  Chest pain Resolved. Likely related to uncontrolled Lupus. Appropriately managed by rheumatology.   Essential hypertension Controlled at 107/70. Advised continuing blood pressure medications: amlodipine 5mg  daily, olmesartan 20mg  daily.  Healthcare maintenance Patient declined bivalent COVID booster.   Normocytic anemia CBC improved from ED visit on 06/13/21. Will not recheck today.  Return if symptoms worsen or fail to improve. Wells Guiles, DO 06/26/2021, 8:32 PM PGY-1, [place]

## 2021-06-26 NOTE — Patient Instructions (Signed)
It was great to see you today! Thank you for choosing [facility] for your primary care. Emily Hicks was seen for ED follow-up from chest pain.  Today we addressed: Given that you recently seen in the ED again and had labs, do not believe further labs for your anemia would be appropriate.  I am glad to see that you are chest pain and breathing difficulty have resolved.  Your blood pressure looks great today, please continue taking your blood pressure medications of amlodipine and olmesartan. Understand you are attempting to switch to an internal medicine physician, please let us know if you are going to continue receiving care here or eventually transition.  We are happy to take care of your medical needs anytime.  If you haven't already, sign up for My Chart to have easy access to your labs results, and communication with your primary care physician.  You should return to our clinic No follow-ups on file.  Please arrive 15 minutes before your appointment to ensure smooth check in process.  We appreciate your efforts in making this happen.  Please call the clinic at [phone] if your symptoms worsen or you have any concerns.  Thank you for allowing me to participate in your care, [name], DO 06/26/2021, 3:02 PM PGY-1, [facility]

## 2021-06-26 NOTE — Assessment & Plan Note (Signed)
Controlled at 107/70. Advised continuing blood pressure medications: amlodipine 5mg  daily, olmesartan 20mg  daily.

## 2021-06-26 NOTE — Assessment & Plan Note (Addendum)
CBC improved from ED visit on 06/13/21. Will not recheck today.

## 2021-07-09 ENCOUNTER — Encounter: Payer: Self-pay | Admitting: *Deleted

## 2021-08-15 ENCOUNTER — Encounter: Payer: Self-pay | Admitting: Internal Medicine

## 2021-08-15 ENCOUNTER — Ambulatory Visit: Payer: 59 | Attending: Internal Medicine | Admitting: Internal Medicine

## 2021-08-15 VITALS — BP 115/81 | HR 91 | Temp 98.5°F | Ht 61.0 in | Wt 184.6 lb

## 2021-08-15 DIAGNOSIS — D649 Anemia, unspecified: Secondary | ICD-10-CM

## 2021-08-15 DIAGNOSIS — G35 Multiple sclerosis: Secondary | ICD-10-CM | POA: Diagnosis not present

## 2021-08-15 DIAGNOSIS — Z3202 Encounter for pregnancy test, result negative: Secondary | ICD-10-CM

## 2021-08-15 DIAGNOSIS — M3219 Other organ or system involvement in systemic lupus erythematosus: Secondary | ICD-10-CM

## 2021-08-15 DIAGNOSIS — E669 Obesity, unspecified: Secondary | ICD-10-CM

## 2021-08-15 DIAGNOSIS — Z3042 Encounter for surveillance of injectable contraceptive: Secondary | ICD-10-CM

## 2021-08-15 DIAGNOSIS — Z7689 Persons encountering health services in other specified circumstances: Secondary | ICD-10-CM

## 2021-08-15 LAB — POCT URINE PREGNANCY: Preg Test, Ur: NEGATIVE

## 2021-08-15 MED ORDER — MEDROXYPROGESTERONE ACETATE 150 MG/ML IM SUSP
150.0000 mg | Freq: Once | INTRAMUSCULAR | Status: AC
  Administered 2021-08-15: 150 mg via INTRAMUSCULAR

## 2021-08-15 NOTE — Progress Notes (Signed)
Patient ID: Emily Hicks, female    DOB: May 10, 1985  MRN: [id number]  CC: New Patient (Initial Visit)   Subjective: Emily Hicks is a 36 y.o. female who presents for new patient visit Her concerns today include:  Patient with history of  obesity, SLE (dx in 2020. Dr. Deanne Coffer), MS (followed by Dr. Epimenio Foot), Raynaud's phenomenon on Norvasc, chronic migraines, anx/dep, abn PAP HSIL, hidradenitis axilla, chronic tachycardia on metoprolol  Previously was being seen at [facility].  She wanted to get est with a single provider/MD  Pt with hx of SLE.  Followed by Dr. Deanne Coffer at [facility] Last seen 06/2021; f/u Q 3 mths On PLQ 200 mg 2 tabs daily, AZP 50 mg daily and Saphnelo infusion Q mth. Previously on Cellcept.  This was D/C 2 mths ago and started on Saphnelo Has eye exam Q yr.  Last 02/2021 Had few flares this yr including pericarditis.  Placed on Colchicine 06/2021.  Her rheumatology told her to stay on it for now.  She has appt to est with [facility] cardiologist next mth.  CP if she coughs or laughs hard but nothing like what it was in May of this yr  Hx of MS: followed by Dr. Epimenio Foot.  Last seen 02/2020.  Ds has remained fairly stable.  Reports being told to follow rheumatologist plan. No recent flare Not taking vit D 5000 daily as was recommended by Dr. Epimenio Foot on her last visit with him.  On Norvasc prescribed by her rheumatologist for Raynaud's not BP. On Metoprolol for persistent tachycardia.  Prescribed in May during SLE flare.   Hx of abn PAP.  Sees gyn specialist. Had [facility] a few yrs ago.  Since then she had 2 paps that have been nl.  Last pap was 12/2020  Obesity:  wgh fluctuates. Loss 10 lbs in May. Feels she can do better with eating habits.  Cut out sodas and eating leaner meats.  Has not been getting in any exercise for several months because of fatigue associated with SLE particularly during her flare in May.  Now that her disease has somewhat quieted down, she  plans to start moving more  Has been on depo x 10 yrs.  Was due May but did not get it due to SLE flare at the time.  Would like to get shot today if possible and continue to get it through our clinic.  Last sexually active 2 days ago.  She has not had a menstrual cycle in years since being on Depo.  Has a mild anemia, reports she was not aware of this. No dizziness  Patient Active Problem List   Diagnosis Date Noted   Chest pain 06/13/2021   Sepsis (HCC) 06/13/2021   Rash and nonspecific skin eruption 05/21/2020   CIN III (cervical intraepithelial neoplasia grade III) with severe dysplasia 02/09/2020   HSIL (high grade squamous intraepithelial lesion) on Pap smear of cervix 11/03/2019   Cervical cancer screening 09/30/2019   Essential hypertension 09/19/2018   SIRS (systemic inflammatory response syndrome) (HCC) 08/16/2018   SLE (systemic lupus erythematosus related syndrome) (HCC) 08/16/2018   Pancytopenia (HCC)    Anxiety and depression    Elevated liver enzymes 08/02/2018   Normocytic anemia    Tachycardia 07/16/2018   Multiple sclerosis (HCC) 03/31/2018   Chronic migraine 08/26/2017   Chondromalacia, patella, left 06/24/2017   Healthcare maintenance 12/20/2015   Contraceptive management 10/11/2014   Hidradenitis suppurativa of left axilla 06/07/2013  Dysplasia of cervix, low grade (CIN 1) 07/03/2011   Obesity, Class I, BMI 30.0-34.9 (see actual BMI) 05/14/2011     Current Outpatient Medications on File Prior to Visit  Medication Sig Dispense Refill   amLODipine (NORVASC) 5 MG tablet Take 1 tablet (5 mg total) by mouth daily. Hold this medication for now, please check your blood pressure daily, bring record for your pcp to review, further meds adjustment for blood pressure per pcp     Anifrolumab-fnia (SAPHNELO) 300 MG/2ML SOLN Inject 300 mg into the vein every 30 (thirty) days.     azaTHIOprine (IMURAN) 50 MG tablet See admin instructions.     clobetasol ointment (TEMOVATE)  0.05 % Apply 1 application. topically 2 (two) times daily.     colchicine 0.6 MG tablet Take 1 tablet (0.6 mg total) by mouth 2 (two) times daily. 60 tablet 3   hydroxychloroquine (PLAQUENIL) 200 MG tablet Take 200 mg by mouth daily.     metoprolol succinate (TOPROL-XL) 100 MG 24 hr tablet Take 1 tablet (100 mg total) by mouth daily. Take with or immediately following a meal. 30 tablet 3   diphenhydrAMINE (BENADRYL) 25 mg capsule Take 25 mg by mouth every 6 (six) hours as needed for itching. (Patient not taking: Reported on 08/15/2021)     loratadine (CLARITIN) 10 MG tablet Take 10 mg by mouth daily as needed for allergies. (Patient not taking: Reported on 08/15/2021)     methocarbamol (ROBAXIN) 500 MG tablet Take 2 tablets (1,000 mg total) by mouth 3 (three) times daily as needed for muscle spasms. (Patient not taking: Reported on 06/26/2021) 30 tablet 0   olmesartan (BENICAR) 20 MG tablet Take 1 tablet (20 mg total) by mouth at bedtime. (Patient not taking: Reported on 08/15/2021) 90 tablet 3   oxyCODONE-acetaminophen (PERCOCET/ROXICET) 5-325 MG tablet Take 1 tablet by mouth every 6 (six) hours as needed for severe pain. (Patient not taking: Reported on 06/26/2021) 12 tablet 0   Current Facility-Administered Medications on File Prior to Visit  Medication Dose Route Frequency Provider Last Rate Last Admin   medroxyPROGESTERone (DEPO-PROVERA) injection 150 mg  150 mg Intramuscular Q90 days McDiarmid, Leighton Roach, MD   150 mg at 03/18/21 1435    No Known Allergies  Social History   Socioeconomic History   Marital status: Married    Spouse name: Not on file   Number of children: 1   Years of education: 16   Highest education level: Bachelor's degree (e.g., BA, AB, BS)  Occupational History   Occupation: Accounting  Tobacco Use   Smoking status: Never   Smokeless tobacco: Never  Vaping Use   Vaping Use: Never used  Substance and Sexual Activity   Alcohol use: No   Drug use: No   Sexual  activity: Yes    Birth control/protection: Injection    Comment: 1st intercourse 36 yo-Fewer than 5 partners  Other Topics Concern   Not on file  Social History Narrative   Lives at home with husband and son.   Right-handed.   No caffeine use.   Social Determinants of Health   Financial Resource Strain: Not on file  Food Insecurity: Not on file  Transportation Needs: Not on file  Physical Activity: Not on file  Stress: Not on file  Social Connections: Not on file  Intimate Partner Violence: Not on file    Family History  Problem Relation Age of Onset   Hypertension Father    Hypertension Mother    Hypertension  Sister    Diabetes Maternal Grandmother    Diabetes Maternal Grandfather     Past Surgical History:  Procedure Laterality Date   CERVICAL CONIZATION W/BX N/A 01/16/2020   Procedure: CERVICAL CONE BIOPSY, ENDOCERVICAL CURETTAGE;  Surgeon: Theresia Majors, MD;  Location: [facility] [place];  Service: Gynecology;  Laterality: N/A;  request 7:30am OR start in [place] Gyn block requests 45 minutes   CHOLECYSTECTOMY N/A 12/28/2013   Procedure: LAPAROSCOPIC CHOLECYSTECTOMY;  Surgeon: Claud Kelp, MD;  Location: [facility];  Service: General;  Laterality: N/A;    ROS: Review of Systems Negative except as stated above  PHYSICAL EXAM: BP 115/81   Pulse 91   Temp 98.5 F (36.9 C) (Oral)   Ht 5\' 1"  (1.549 m)   Wt 184 lb 9.6 oz (83.7 kg)   SpO2 97%   BMI 34.88 kg/m   Wt Readings from Last 3 Encounters:  08/15/21 184 lb 9.6 oz (83.7 kg)  06/26/21 174 lb 12.8 oz (79.3 kg)  06/16/21 185 lb (83.9 kg)    Physical Exam  General appearance - alert, well appearing, obese young to middle-aged African-American female and in no distress Mental status - normal mood, behavior, speech, dress, motor activity, and thought processes Neck - supple, no significant adenopathy Chest - clear to auscultation, no wheezes, rales or rhonchi, symmetric air entry Heart -  normal rate, regular rhythm, normal S1, S2, no murmurs, rubs, clicks or gallops Extremities - peripheral pulses normal, no pedal edema, no clubbing or cyanosis      Latest Ref Rng & Units 06/16/2021    2:35 AM 06/15/2021   12:16 AM 06/14/2021    3:24 AM  CMP  Glucose 70 - 99 mg/dL 761   950   BUN 6 - 20 mg/dL 15   12   Creatinine 9.32 - 1.00 mg/dL 6.71  2.45  8.09   Sodium 135 - 145 mmol/L 141   142   Potassium 3.5 - 5.1 mmol/L 4.4   3.8   Chloride 98 - 111 mmol/L 115   118   CO2 22 - 32 mmol/L 20   19   Calcium 8.9 - 10.3 mg/dL 9.5   9.0   Total Protein 6.5 - 8.1 g/dL 7.9   6.6   Total Bilirubin 0.3 - 1.2 mg/dL 0.4   0.7   Alkaline Phos 38 - 126 U/L 64   61   AST 15 - 41 U/L 16   17   ALT 0 - 44 U/L 17   16    Lipid Panel     Component Value Date/Time   CHOL 79 12/20/2015 1047   TRIG 71 12/20/2015 1047   HDL 31 (L) 12/20/2015 1047   CHOLHDL 2.5 12/20/2015 1047   VLDL 14 12/20/2015 1047   LDLCALC 34 12/20/2015 1047    CBC    Component Value Date/Time   WBC 9.0 06/16/2021 0235   RBC 3.69 (L) 06/16/2021 0235   HGB 10.1 (L) 06/16/2021 0235   HGB 12.5 09/29/2019 1035   HCT 31.9 (L) 06/16/2021 0235   HCT 36.5 09/29/2019 1035   PLT 352 06/16/2021 0235   PLT 260 09/29/2019 1035   MCV 86.4 06/16/2021 0235   MCV 84 09/29/2019 1035   MCH 27.4 06/16/2021 0235   MCHC 31.7 06/16/2021 0235   RDW 14.2 06/16/2021 0235   RDW 12.4 09/29/2019 1035   LYMPHSABS 0.8 06/16/2021 0235   LYMPHSABS 0.9 05/11/2018 1524   MONOABS 0.6 06/16/2021 0235  EOSABS 0.0 06/16/2021 0235   EOSABS 0.0 05/11/2018 1524   BASOSABS 0.0 06/16/2021 0235   BASOSABS 0.0 05/11/2018 1524   Results for orders placed or performed in visit on 08/15/21  POCT urine pregnancy  Result Value Ref Range   Preg Test, Ur Negative Negative    ASSESSMENT AND PLAN: 1. Establishing care with new doctor, encounter for   2. Systemic lupus erythematosus with other organ involvement, unspecified SLE type (HCC) -Stable  and followed by rheumatology.  On azathioprine, Plaquenil and Saphnelo.  She is up-to-date with eye exam given that she is on Plaquenil.   3. MS (multiple sclerosis) (HCC) Stable with no recent flares.  Recommend taking a vitamin D supplement as it is shown to be beneficial in patients with MS - VITAMIN D 25 Hydroxy (Vit-D Deficiency, Fractures)  4. Family planning, Depo-Provera contraception monitoring/administration Discussed family-planning methods today.  She is aware that Depo can cause some weight gain.  Discussed other contraception options for someone who has lupus.  Cough IUD can be considered as it is nonhormonal, does not cause weight gain and not associated with potential for blood clots.  Ultimately patient decided to remain on Depo.  Shot given today. - POCT urine pregnancy - medroxyPROGESTERone (DEPO-PROVERA) injection 150 mg  5. Obesity, Class I, BMI 30.0-34.9 (see actual BMI) Patient advised to eliminate sugary drinks from the diet, cut back on portion sizes especially of white carbohydrates, eat more white lean meat like chicken [place] and seafood instead of beef or pork and incorporate fresh fruits and vegetables into the diet daily. -Encouraged her to start low and go slow with exercise.  If she can start with doing just 10 minutes 3 days a week and then gradually try to increase to 30 minutes. - Lipid panel - Hemoglobin A1c  6. Normocytic anemia Likely associated with SLE.  However we will check iron studies to rule out iron deficiency. - CBC - Iron, TIBC and Ferritin Panel     Patient was given the opportunity to ask questions.  Patient verbalized understanding of the plan and was able to repeat key elements of the plan.   This documentation was completed using Paediatric nurse.  Any transcriptional errors are unintentional.  No orders of the defined types were placed in this encounter.    Requested Prescriptions    No prescriptions  requested or ordered in this encounter    No follow-ups on file.  Jonah Blue, MD, FACP

## 2021-08-15 NOTE — Patient Instructions (Signed)

## 2021-08-16 ENCOUNTER — Other Ambulatory Visit: Payer: Self-pay | Admitting: Internal Medicine

## 2021-08-16 LAB — CBC
Hematocrit: 37.7 % (ref 34.0–46.6)
Hemoglobin: 11.9 g/dL (ref 11.1–15.9)
MCH: 26.3 pg — ABNORMAL LOW (ref 26.6–33.0)
MCHC: 31.6 g/dL (ref 31.5–35.7)
MCV: 83 fL (ref 79–97)
Platelets: 292 10*3/uL (ref 150–450)
RBC: 4.53 x10E6/uL (ref 3.77–5.28)
RDW: 13.7 % (ref 11.7–15.4)
WBC: 5 10*3/uL (ref 3.4–10.8)

## 2021-08-16 LAB — IRON,TIBC AND FERRITIN PANEL
Ferritin: 238 ng/mL — ABNORMAL HIGH (ref 15–150)
Iron Saturation: 14 % — ABNORMAL LOW (ref 15–55)
Iron: 39 ug/dL (ref 27–159)
Total Iron Binding Capacity: 287 ug/dL (ref 250–450)
UIBC: 248 ug/dL (ref 131–425)

## 2021-08-16 LAB — LIPID PANEL
Chol/HDL Ratio: 2.6 ratio (ref 0.0–4.4)
Cholesterol, Total: 126 mg/dL (ref 100–199)
HDL: 49 mg/dL (ref >39)
LDL Chol Calc (NIH): 59 mg/dL (ref 0–99)
Triglycerides: 93 mg/dL (ref 0–149)
VLDL Cholesterol Cal: 18 mg/dL (ref 5–40)

## 2021-08-16 LAB — VITAMIN D 25 HYDROXY (VIT D DEFICIENCY, FRACTURES): Vit D, 25-Hydroxy: 14 ng/mL — ABNORMAL LOW (ref 30.0–100.0)

## 2021-08-16 LAB — HEMOGLOBIN A1C
Est. average glucose Bld gHb Est-mCnc: 114 mg/dL
Hgb A1c MFr Bld: 5.6 % (ref 4.8–5.6)

## 2021-08-16 MED ORDER — VITAMIN D (ERGOCALCIFEROL) 1.25 MG (50000 UNIT) PO CAPS: 50000.0000 [IU] | ORAL_CAPSULE | ORAL | 1 refills | Status: DC

## 2021-09-06 ENCOUNTER — Ambulatory Visit: Payer: 59 | Admitting: Internal Medicine

## 2021-10-04 ENCOUNTER — Ambulatory Visit: Payer: 59 | Admitting: Internal Medicine

## 2021-10-31 ENCOUNTER — Ambulatory Visit: Payer: 59

## 2021-11-19 NOTE — Progress Notes (Deleted)
Cardiology Office Note:    Date:  11/19/2021   ID:  Emily Hicks, DOB 03/25/1985, MRN [id number]  PCP:  Ladell Pier, MD   [place] Providers Cardiologist:  None { Click to update primary MD,subspecialty MD or APP then REFRESH:1}    Referring MD: Wells Guiles, DO   CC: Hospital follow up  Needs EKG  History of Present Illness:    Emily Hicks is a 36 y.o. female with a hx of ***  Past Medical History:  Diagnosis Date   Cervical intraepithelial neoplasia III    Gallstones    GERD (gastroesophageal reflux disease)    only prn OTC occasionally   History of blood transfusion 2020   Lupus ([place]) dx 2020   Multiple sclerosis ([place]) dx 2020   Numbness and tingling of left leg     Past Surgical History:  Procedure Laterality Date   CERVICAL CONIZATION W/BX N/A 01/16/2020   Procedure: CERVICAL CONE BIOPSY, ENDOCERVICAL CURETTAGE;  Surgeon: Joseph Pierini, MD;  Location: [place];  Service: Gynecology;  Laterality: N/A;  request 7:30am OR start in [place] block requests 45 minutes   CHOLECYSTECTOMY N/A 12/28/2013   Procedure: LAPAROSCOPIC CHOLECYSTECTOMY;  Surgeon: Fanny Skates, MD;  Location: [facility];  Service: General;  Laterality: N/A;    Current Medications: No outpatient medications have been marked as taking for the 11/20/21 encounter (Appointment) with Werner Lean, MD.   Current Facility-Administered Medications for the 11/20/21 encounter (Appointment) with Werner Lean, MD  Medication   medroxyPROGESTERone (DEPO-PROVERA) injection 150 mg     Allergies:   Patient has no known allergies.   Social History   Socioeconomic History   Marital status: Married    Spouse name: Not on file   Number of children: 1   Years of education: 16   Highest education level: Bachelor's degree (e.g., BA, AB, BS)  Occupational History   Occupation: Accounting  Tobacco Use   Smoking status: Never   Smokeless  tobacco: Never  Vaping Use   Vaping Use: Never used  Substance and Sexual Activity   Alcohol use: No   Drug use: No   Sexual activity: Yes    Birth control/protection: Injection    Comment: 1st intercourse 36 yo-Fewer than 5 partners  Other Topics Concern   Not on file  Social History Narrative   Lives at home with husband and son.   Right-handed.   No caffeine use.   Social Determinants of Health   Financial Resource Strain: Not on file  Food Insecurity: Not on file  Transportation Needs: Not on file  Physical Activity: Not on file  Stress: Not on file  Social Connections: Not on file     Family History: The patient's ***family history includes Diabetes in her maternal grandfather and maternal grandmother; Hypertension in her father, mother, and sister.  ROS:   Please see the history of present illness.    *** All other systems reviewed and are negative.  EKGs/Labs/Other Studies Reviewed:    The following studies were reviewed today: ***  EKG:  EKG is *** ordered today.  The ekg ordered today demonstrates *** 11/20/21: ***  ECHO COMPLETE WO IMAGING ENHANCING AGENT 06/14/2021  Narrative ECHOCARDIOGRAM REPORT    Patient Name:   Emily Hicks [place] Date of Exam: 06/14/2021 Medical Rec #:  [id number]     Height:       61.0 in Accession #:    [id number]    Weight:  185.0 lb Date of Birth:  July 14, 1985     BSA:          1.827 m Patient Age:    35 years      BP:           146/97 mmHg Patient Gender: F             HR:           104 bpm. Exam Location:  Inpatient  Procedure: 2D Echo, Cardiac Doppler and Color Doppler  Indications:    Chest pain  History:        Patient has prior history of Echocardiogram examinations, most recent 08/02/2018. Risk Factors:Hypertension.  Sonographer:    Jefferey Pica Referring Phys: [place]   1. Left ventricular ejection fraction, by estimation, is 60 to 65%. The left ventricle has normal function.  The left ventricle has no regional wall motion abnormalities. Indeterminate diastolic filling due to E-A fusion. 2. Right ventricular systolic function is normal. The right ventricular size is normal. There is normal pulmonary artery systolic pressure. 3. Left atrial size was mildly dilated. 4. Trivial mitral valve regurgitation. 5. The aortic valve is normal in structure. Aortic valve regurgitation is not visualized. 6. The inferior vena cava is normal in size with <50% respiratory variability, suggesting right atrial pressure of 8 mmHg.  Comparison(s): No significant change from prior study.  FINDINGS Left Ventricle: Left ventricular ejection fraction, by estimation, is 60 to 65%. The left ventricle has normal function. The left ventricle has no regional wall motion abnormalities. The left ventricular internal cavity size was normal in size. There is borderline left ventricular hypertrophy. Indeterminate diastolic filling due to E-A fusion.  Right Ventricle: The right ventricular size is normal. No increase in right ventricular wall thickness. Right ventricular systolic function is normal. There is normal pulmonary artery systolic pressure. The tricuspid regurgitant velocity is 2.33 m/s, and with an assumed right atrial pressure of 8 mmHg, the estimated right ventricular systolic pressure is 16.0 mmHg.  Left Atrium: Left atrial size was mildly dilated.  Right Atrium: Right atrial size was normal in size.  Pericardium: There is no evidence of pericardial effusion.  Mitral Valve: Trivial mitral valve regurgitation.  Tricuspid Valve: The tricuspid valve is normal in structure. Tricuspid valve regurgitation is trivial.  Aortic Valve: The aortic valve is normal in structure. Aortic valve regurgitation is not visualized. Aortic valve peak gradient measures 13.2 mmHg.  Pulmonic Valve: The pulmonic valve was normal in structure. Pulmonic valve regurgitation is not visualized.  Aorta: The  aortic root and ascending aorta are structurally normal, with no evidence of dilitation.  Venous: The inferior vena cava is normal in size with less than 50% respiratory variability, suggesting right atrial pressure of 8 mmHg.  IAS/Shunts: No atrial level shunt detected by color flow Doppler.   LEFT VENTRICLE PLAX 2D LVIDd:         4.50 cm   Diastology LVIDs:         3.10 cm   LV e' medial:    6.83 cm/s LV PW:         1.00 cm   LV E/e' medial:  13.1 LV IVS:        1.10 cm   LV e' lateral:   8.93 cm/s LVOT diam:     2.00 cm   LV E/e' lateral: 10.0 LV SV:         74 LV SV Index:   40 LVOT  Area:     3.14 cm   RIGHT VENTRICLE             IVC RV Basal diam:  3.00 cm     IVC diam: 2.00 cm RV S prime:     14.90 cm/s TAPSE (M-mode): 2.1 cm  LEFT ATRIUM             Index        RIGHT ATRIUM           Index LA diam:        4.40 cm 2.41 cm/m   RA Area:     12.40 cm LA Vol (A2C):   53.5 ml 29.28 ml/m  RA Volume:   26.50 ml  14.50 ml/m LA Vol (A4C):   64.1 ml 35.08 ml/m LA Biplane Vol: 63.3 ml 34.64 ml/m AORTIC VALVE                 PULMONIC VALVE AV Area (Vmax): 2.31 cm     PV Vmax:       1.04 m/s AV Vmax:        182.00 cm/s  PV Peak grad:  4.3 mmHg AV Peak Grad:   13.2 mmHg LVOT Vmax:      134.00 cm/s LVOT Vmean:     82.500 cm/s LVOT VTI:       0.234 m  AORTA Ao Root diam: 3.00 cm Ao Asc diam:  2.70 cm  MITRAL VALVE               TRICUSPID VALVE MV Area (PHT): 5.20 cm    TR Peak grad:   21.7 mmHg MV Decel Time: 146 msec    TR Vmax:        233.00 cm/s MV E velocity: 89.70 cm/s MV A velocity: 98.00 cm/s  SHUNTS MV E/A ratio:  0.92        Systemic VTI:  0.23 m Systemic Diam: 2.00 cm  Phineas Inches Electronically signed by Phineas Inches Signature Date/Time: 06/14/2021/12:15:51 PM    Final     Recent Labs: 06/13/2021: B Natriuretic Peptide 20.8; Magnesium 1.8; TSH 0.444 06/16/2021: ALT 17; BUN 15; Creatinine, Ser 0.50; Potassium 4.4; Sodium 141 08/15/2021: Hemoglobin  11.9; Platelets 292  Recent Lipid Panel    Component Value Date/Time   CHOL 126 08/15/2021 1451   TRIG 93 08/15/2021 1451   HDL 49 08/15/2021 1451   CHOLHDL 2.6 08/15/2021 1451   CHOLHDL 2.5 12/20/2015 1047   VLDL 14 12/20/2015 1047   LDLCALC 59 08/15/2021 1451     Risk Assessment/Calculations:   {Does this patient have ATRIAL FIBRILLATION?:[id number]}  No BP recorded.  {Refresh Note OR Click here to enter BP  :1}***         Physical Exam:    VS:  There were no vitals taken for this visit.    Wt Readings from Last 3 Encounters:  08/15/21 184 lb 9.6 oz (83.7 kg)  06/26/21 174 lb 12.8 oz (79.3 kg)  06/16/21 185 lb (83.9 kg)     GEN: *** Well nourished, well developed in no acute distress HEENT: Normal NECK: No JVD; No carotid bruits LYMPHATICS: No lymphadenopathy CARDIAC: ***RRR, no murmurs, rubs, gallops RESPIRATORY:  Clear to auscultation without rales, wheezing or rhonchi  ABDOMEN: Soft, non-tender, non-distended MUSCULOSKELETAL:  No edema; No deformity  SKIN: Warm and dry NEUROLOGIC:  Alert and oriented x 3 PSYCHIATRIC:  Normal affect   ASSESSMENT:    No diagnosis found. PLAN:  Concern for Acute Lupus Pericarditis Lupus Hx of sinus tachycardia (unclear if inappropriate) - ESR/CRP - repeat EKG:  - BB - Colchicine - Rheumatology - did not have pericardial effusion on hospital echo  PRN or one year     {Are you ordering a CV Procedure (e.g. stress test, cath, DCCV, TEE, etc)?   Press F2        :[id number]}    Medication Adjustments/Labs and Tests Ordered: Current medicines are reviewed at length with the patient today.  Concerns regarding medicines are outlined above.  No orders of the defined types were placed in this encounter.  No orders of the defined types were placed in this encounter.   There are no Patient Instructions on file for this visit.   Signed, Werner Lean, MD  11/19/2021 8:44 AM    [place]

## 2021-11-20 ENCOUNTER — Ambulatory Visit: Payer: 59 | Admitting: Internal Medicine

## 2021-12-16 ENCOUNTER — Ambulatory Visit: Payer: 59 | Admitting: Internal Medicine

## 2022-06-11 ENCOUNTER — Emergency Department (HOSPITAL_COMMUNITY)
Admission: EM | Admit: 2022-06-11 | Discharge: 2022-06-11 | Discharge status: Against Medical Advice | Payer: 59 | Attending: Emergency Medicine | Admitting: Emergency Medicine

## 2022-06-11 ENCOUNTER — Other Ambulatory Visit: Payer: Self-pay

## 2022-06-11 DIAGNOSIS — R232 Flushing: Secondary | ICD-10-CM | POA: Insufficient documentation

## 2022-06-11 DIAGNOSIS — Z5321 Procedure and treatment not carried out due to patient leaving prior to being seen by health care provider: Secondary | ICD-10-CM | POA: Insufficient documentation

## 2022-06-11 DIAGNOSIS — R110 Nausea: Secondary | ICD-10-CM | POA: Diagnosis not present

## 2022-06-11 DIAGNOSIS — R5383 Other fatigue: Secondary | ICD-10-CM | POA: Diagnosis not present

## 2022-06-11 LAB — BASIC METABOLIC PANEL
Anion gap: 9 (ref 5–15)
BUN: 9 mg/dL (ref 6–20)
CO2: 24 mmol/L (ref 22–32)
Calcium: 8.6 mg/dL — ABNORMAL LOW (ref 8.9–10.3)
Chloride: 107 mmol/L (ref 98–111)
Creatinine, Ser: 0.76 mg/dL (ref 0.44–1.00)
GFR, Estimated: >60 mL/min (ref >60)
Glucose, Bld: 92 mg/dL (ref 70–99)
Potassium: 3.4 mmol/L — ABNORMAL LOW (ref 3.5–5.1)
Sodium: 140 mmol/L (ref 135–145)

## 2022-06-11 LAB — CBC
HCT: 37.4 % (ref 36.0–46.0)
Hemoglobin: 12.1 g/dL (ref 12.0–15.0)
MCH: 29.7 pg (ref 26.0–34.0)
MCHC: 32.4 g/dL (ref 30.0–36.0)
MCV: 91.7 fL (ref 80.0–100.0)
Platelets: 299 10*3/uL (ref 150–400)
RBC: 4.08 MIL/uL (ref 3.87–5.11)
RDW: 13.1 % (ref 11.5–15.5)
WBC: 3.4 10*3/uL — ABNORMAL LOW (ref 4.0–10.5)
nRBC: 0 % (ref 0.0–0.2)

## 2022-06-11 NOTE — ED Triage Notes (Signed)
Pt arrived via POV. C/o hot flashes, fatigue, and nausea for 6x days. State it feels like a Lupus flare up.  Aox4

## 2022-06-11 NOTE — ED Notes (Signed)
Urine sent to lab 

## 2022-06-16 ENCOUNTER — Observation Stay (HOSPITAL_COMMUNITY)
Admission: EM | Admit: 2022-06-16 | Discharge: 2022-06-18 | Disposition: A | Discharge status: Home / Self Care | Payer: 59 | Attending: Internal Medicine | Admitting: Internal Medicine

## 2022-06-16 ENCOUNTER — Encounter (HOSPITAL_COMMUNITY): Payer: Self-pay

## 2022-06-16 ENCOUNTER — Inpatient Hospital Stay (HOSPITAL_COMMUNITY): Payer: 59

## 2022-06-16 ENCOUNTER — Other Ambulatory Visit: Payer: Self-pay

## 2022-06-16 ENCOUNTER — Emergency Department (HOSPITAL_COMMUNITY): Payer: 59

## 2022-06-16 DIAGNOSIS — R109 Unspecified abdominal pain: Secondary | ICD-10-CM | POA: Diagnosis present

## 2022-06-16 DIAGNOSIS — E162 Hypoglycemia, unspecified: Principal | ICD-10-CM | POA: Insufficient documentation

## 2022-06-16 DIAGNOSIS — Z79899 Other long term (current) drug therapy: Secondary | ICD-10-CM | POA: Insufficient documentation

## 2022-06-16 DIAGNOSIS — R6510 Systemic inflammatory response syndrome (SIRS) of non-infectious origin without acute organ dysfunction: Secondary | ICD-10-CM | POA: Diagnosis not present

## 2022-06-16 DIAGNOSIS — N390 Urinary tract infection, site not specified: Secondary | ICD-10-CM | POA: Diagnosis present

## 2022-06-16 DIAGNOSIS — M329 Systemic lupus erythematosus, unspecified: Secondary | ICD-10-CM

## 2022-06-16 DIAGNOSIS — M3210 Systemic lupus erythematosus, organ or system involvement unspecified: Secondary | ICD-10-CM | POA: Insufficient documentation

## 2022-06-16 DIAGNOSIS — N12 Tubulo-interstitial nephritis, not specified as acute or chronic: Secondary | ICD-10-CM | POA: Insufficient documentation

## 2022-06-16 LAB — URINALYSIS, ROUTINE W REFLEX MICROSCOPIC
Bilirubin Urine: NEGATIVE
Bilirubin Urine: NEGATIVE
Glucose, UA: NEGATIVE mg/dL
Glucose, UA: NEGATIVE mg/dL
Hgb urine dipstick: NEGATIVE
Hgb urine dipstick: NEGATIVE
Ketones, ur: NEGATIVE mg/dL
Ketones, ur: NEGATIVE mg/dL
Leukocytes,Ua: NEGATIVE
Nitrite: NEGATIVE
Nitrite: NEGATIVE
Protein, ur: NEGATIVE mg/dL
Protein, ur: NEGATIVE mg/dL
Specific Gravity, Urine: 1.008 (ref 1.005–1.030)
Specific Gravity, Urine: 1.008 (ref 1.005–1.030)
pH: 7 (ref 5.0–8.0)
pH: 6 (ref 5.0–8.0)

## 2022-06-16 LAB — CBG MONITORING, ED
Glucose-Capillary: 22 mg/dL — CL (ref 70–99)
Glucose-Capillary: 59 mg/dL — ABNORMAL LOW (ref 70–99)
Glucose-Capillary: 63 mg/dL — ABNORMAL LOW (ref 70–99)
Glucose-Capillary: 205 mg/dL — ABNORMAL HIGH (ref 70–99)
Glucose-Capillary: 95 mg/dL (ref 70–99)

## 2022-06-16 LAB — BASIC METABOLIC PANEL
Anion gap: 6 (ref 5–15)
BUN: 9 mg/dL (ref 6–20)
CO2: 26 mmol/L (ref 22–32)
Calcium: 8.6 mg/dL — ABNORMAL LOW (ref 8.9–10.3)
Chloride: 107 mmol/L (ref 98–111)
Creatinine, Ser: 0.79 mg/dL (ref 0.44–1.00)
GFR, Estimated: >60 mL/min (ref >60)
Glucose, Bld: 72 mg/dL (ref 70–99)
Potassium: 3.4 mmol/L — ABNORMAL LOW (ref 3.5–5.1)
Sodium: 139 mmol/L (ref 135–145)

## 2022-06-16 LAB — CBC
HCT: 35.3 % — ABNORMAL LOW (ref 36.0–46.0)
HCT: 36.8 % (ref 36.0–46.0)
Hemoglobin: 11.4 g/dL — ABNORMAL LOW (ref 12.0–15.0)
Hemoglobin: 11.6 g/dL — ABNORMAL LOW (ref 12.0–15.0)
MCH: 29.7 pg (ref 26.0–34.0)
MCH: 29.3 pg (ref 26.0–34.0)
MCHC: 32.3 g/dL (ref 30.0–36.0)
MCHC: 31.5 g/dL (ref 30.0–36.0)
MCV: 91.9 fL (ref 80.0–100.0)
MCV: 92.9 fL (ref 80.0–100.0)
Platelets: 316 10*3/uL (ref 150–400)
Platelets: 313 10*3/uL (ref 150–400)
RBC: 3.84 MIL/uL — ABNORMAL LOW (ref 3.87–5.11)
RBC: 3.96 MIL/uL (ref 3.87–5.11)
RDW: 13.2 % (ref 11.5–15.5)
RDW: 13.2 % (ref 11.5–15.5)
WBC: 3.5 10*3/uL — ABNORMAL LOW (ref 4.0–10.5)
WBC: 3.7 10*3/uL — ABNORMAL LOW (ref 4.0–10.5)
nRBC: 0 % (ref 0.0–0.2)
nRBC: 0 % (ref 0.0–0.2)

## 2022-06-16 LAB — LACTIC ACID, PLASMA: Lactic Acid, Venous: 1.3 mmol/L (ref 0.5–1.9)

## 2022-06-16 LAB — CREATININE, SERUM
Creatinine, Ser: 0.74 mg/dL (ref 0.44–1.00)
GFR, Estimated: >60 mL/min (ref >60)

## 2022-06-16 LAB — I-STAT BETA HCG BLOOD, ED (MC, WL, AP ONLY): I-stat hCG, quantitative: <5 m[IU]/mL (ref <5)

## 2022-06-16 LAB — HEMOGLOBIN A1C
Hgb A1c MFr Bld: 5.4 % (ref 4.8–5.6)
Hgb A1c MFr Bld: 5.4 % (ref 4.8–5.6)
Mean Plasma Glucose: 108.28 mg/dL
Mean Plasma Glucose: 108.28 mg/dL

## 2022-06-16 LAB — TSH: TSH: 0.436 u[IU]/mL (ref 0.350–4.500)

## 2022-06-16 LAB — SEDIMENTATION RATE: Sed Rate: 20 mm/hr (ref 0–22)

## 2022-06-16 LAB — GLUCOSE, CAPILLARY
Glucose-Capillary: 88 mg/dL (ref 70–99)
Glucose-Capillary: 99 mg/dL (ref 70–99)

## 2022-06-16 LAB — PROCALCITONIN: Procalcitonin: <0.1 ng/mL

## 2022-06-16 LAB — C-REACTIVE PROTEIN: CRP: 0.8 mg/dL (ref <1.0)

## 2022-06-16 MED ORDER — SODIUM CHLORIDE 0.9% FLUSH: 3.0000 mL | INTRAVENOUS | Status: DC | PRN

## 2022-06-16 MED ORDER — ONDANSETRON HCL 4 MG PO TABS: 4.0000 mg | ORAL_TABLET | Freq: Four times a day (QID) | ORAL | Status: DC | PRN

## 2022-06-16 MED ORDER — SODIUM CHLORIDE 0.9 % IV SOLN
1.0000 g | INTRAVENOUS | Status: DC
  Administered 2022-06-17: 1 g via INTRAVENOUS
  Filled 2022-06-16 (×2): qty 10

## 2022-06-16 MED ORDER — SODIUM CHLORIDE (PF) 0.9 % IJ SOLN
INTRAMUSCULAR | Status: AC
  Filled 2022-06-16: qty 50

## 2022-06-16 MED ORDER — LACTATED RINGERS IV BOLUS
1000.0000 mL | Freq: Once | INTRAVENOUS | Status: AC
  Administered 2022-06-16: 1000 mL via INTRAVENOUS

## 2022-06-16 MED ORDER — HYDROCODONE-ACETAMINOPHEN 5-325 MG PO TABS: 1.0000 | ORAL_TABLET | ORAL | Status: DC | PRN

## 2022-06-16 MED ORDER — SODIUM CHLORIDE 0.9 % IV SOLN: 250.0000 mL | INTRAVENOUS | Status: DC | PRN

## 2022-06-16 MED ORDER — KETOROLAC TROMETHAMINE 15 MG/ML IJ SOLN
15.0000 mg | Freq: Four times a day (QID) | INTRAMUSCULAR | Status: DC | PRN
  Administered 2022-06-16 – 2022-06-17 (×2): 15 mg via INTRAVENOUS
  Filled 2022-06-16 (×2): qty 1

## 2022-06-16 MED ORDER — ALBUTEROL SULFATE (2.5 MG/3ML) 0.083% IN NEBU: 2.5000 mg | INHALATION_SOLUTION | RESPIRATORY_TRACT | Status: DC | PRN

## 2022-06-16 MED ORDER — SODIUM CHLORIDE 0.9% FLUSH
3.0000 mL | Freq: Two times a day (BID) | INTRAVENOUS | Status: DC
  Administered 2022-06-16 – 2022-06-17 (×4): 3 mL via INTRAVENOUS

## 2022-06-16 MED ORDER — ONDANSETRON HCL 4 MG/2ML IJ SOLN: 4.0000 mg | Freq: Four times a day (QID) | INTRAMUSCULAR | Status: DC | PRN

## 2022-06-16 MED ORDER — SODIUM CHLORIDE 0.9 % IV SOLN
1.0000 g | Freq: Once | INTRAVENOUS | Status: AC
  Administered 2022-06-16: 1 g via INTRAVENOUS
  Filled 2022-06-16: qty 10

## 2022-06-16 MED ORDER — DEXTROSE 50 % IV SOLN
1.0000 | Freq: Once | INTRAVENOUS | Status: AC
  Administered 2022-06-16: 50 mL via INTRAVENOUS
  Filled 2022-06-16: qty 50

## 2022-06-16 MED ORDER — IOHEXOL 350 MG/ML SOLN
75.0000 mL | Freq: Once | INTRAVENOUS | Status: AC | PRN
  Administered 2022-06-17: 75 mL via INTRAVENOUS

## 2022-06-16 MED ORDER — ALBUTEROL SULFATE (2.5 MG/3ML) 0.083% IN NEBU
2.5000 mg | INHALATION_SOLUTION | Freq: Four times a day (QID) | RESPIRATORY_TRACT | Status: DC
  Administered 2022-06-16: 2.5 mg via RESPIRATORY_TRACT
  Filled 2022-06-16: qty 3

## 2022-06-16 MED ORDER — ACETAMINOPHEN 650 MG RE SUPP: 650.0000 mg | Freq: Four times a day (QID) | RECTAL | Status: DC | PRN

## 2022-06-16 MED ORDER — HEPARIN SODIUM (PORCINE) 5000 UNIT/ML IJ SOLN
5000.0000 [IU] | Freq: Three times a day (TID) | INTRAMUSCULAR | Status: DC
  Administered 2022-06-16 – 2022-06-18 (×5): 5000 [IU] via SUBCUTANEOUS
  Filled 2022-06-16 (×5): qty 1

## 2022-06-16 MED ORDER — ACETAMINOPHEN 325 MG PO TABS
650.0000 mg | ORAL_TABLET | Freq: Four times a day (QID) | ORAL | Status: DC | PRN
  Administered 2022-06-16: 650 mg via ORAL
  Filled 2022-06-16: qty 2

## 2022-06-16 NOTE — ED Notes (Signed)
ED TO INPATIENT HANDOFF REPORT  ED Nurse Name and Phone #: Thamas Jaegers Name/Age/Gender Emily Hicks 37 y.o. female Room/Bed: WA21/WA21  Code Status   Code Status: Full Code  Home/SNF/Other Home Patient oriented to: self, place, time, and situation Is this baseline? Yes   Triage Complete: Triage complete  Chief Complaint Complicated UTI (urinary tract infection) [N39.0]  Triage Note Left sided flank pain, dizziness, feeling like she is going to pass out. Intermittent difficulty urinating, denies pain or burning with urination. Intermittent left sided chest pain BS was 70 with EMS, EMS gave oral glucose Hx of lupus, thinks she is having a flare up   Allergies No Known Allergies  Level of Care/Admitting Diagnosis ED Disposition     ED Disposition  Admit   Condition  --   Comment  Hospital Area: [facility] [[id number]]  Level of Care: Telemetry [5]  Admit to tele based on following criteria: Other see comments  Comments: ....  May admit patient to [facility] or [facility] if equivalent level of care is available:: No  Covid Evaluation: Asymptomatic - no recent exposure (last 10 days) testing not required  Diagnosis: Complicated UTI (urinary tract infection) [960454]  Admitting Physician: Lurline Del [[id number]]  Attending Physician: Lurline Del [[id number]]  Certification:: I certify this patient will need inpatient services for at least 2 midnights  Estimated Length of Stay: 3          B Medical/Surgery History Past Medical History:  Diagnosis Date   Cervical intraepithelial neoplasia III    Gallstones    GERD (gastroesophageal reflux disease)    only prn OTC occasionally   History of blood transfusion 2020   Lupus (HCC) dx 2020   Multiple sclerosis (HCC) dx 2020   Numbness and tingling of left leg    Past Surgical History:  Procedure Laterality Date   CERVICAL CONIZATION W/BX N/A 01/16/2020   Procedure: CERVICAL CONE  BIOPSY, ENDOCERVICAL CURETTAGE;  Surgeon: Theresia Majors, MD;  Location: [facility];  Service: Gynecology;  Laterality: N/A;  request 7:30am OR start in [place] [facility] block requests 45 minutes   CHOLECYSTECTOMY N/A 12/28/2013   Procedure: LAPAROSCOPIC CHOLECYSTECTOMY;  Surgeon: Claud Kelp, MD;  Location: [facility];  Service: General;  Laterality: N/A;     A IV Location/Drains/Wounds Patient Lines/Drains/Airways Status     Active Line/Drains/Airways     Name Placement date Placement time Site Days   Peripheral IV 06/16/22 20 G Left Antecubital 06/16/22  1430  Antecubital  less than 1            Intake/Output Last 24 hours  Intake/Output Summary (Last 24 hours) at 06/16/2022 1752 Last data filed at 06/16/2022 1730 Gross per 24 hour  Intake 1100 ml  Output --  Net 1100 ml    Labs/Imaging Results for orders placed or performed during the hospital encounter of 06/16/22 (from the past 48 hour(s))  CBG monitoring, ED     Status: Abnormal   Collection Time: 06/16/22  2:31 PM  Result Value Ref Range   Glucose-Capillary 59 (L) 70 - 99 mg/dL    Comment: Glucose reference range applies only to samples taken after fasting for at least 8 hours.  CBG monitoring, ED     Status: Abnormal   Collection Time: 06/16/22  2:32 PM  Result Value Ref Range   Glucose-Capillary 63 (L) 70 - 99 mg/dL    Comment: Glucose reference range applies only to samples taken  after fasting for at least 8 hours.  Basic metabolic panel     Status: Abnormal   Collection Time: 06/16/22  2:33 PM  Result Value Ref Range   Sodium 139 135 - 145 mmol/L   Potassium 3.4 (L) 3.5 - 5.1 mmol/L   Chloride 107 98 - 111 mmol/L   CO2 26 22 - 32 mmol/L   Glucose, Bld 72 70 - 99 mg/dL    Comment: Glucose reference range applies only to samples taken after fasting for at least 8 hours.   BUN 9 6 - 20 mg/dL   Creatinine, Ser 1.61 0.44 - 1.00 mg/dL   Calcium 8.6 (L) 8.9 - 10.3 mg/dL   GFR, Estimated >09 >60  mL/min    Comment: (NOTE) Calculated using the CKD-EPI Creatinine Equation (2021)    Anion gap 6 5 - 15    Comment: Performed at [facility], [address] [place], [place] [address]  CBC     Status: Abnormal   Collection Time: 06/16/22  2:33 PM  Result Value Ref Range   WBC 3.5 (L) 4.0 - 10.5 K/uL   RBC 3.84 (L) 3.87 - 5.11 MIL/uL   Hemoglobin 11.4 (L) 12.0 - 15.0 g/dL   HCT 81.1 (L) 91.4 - 78.2 %   MCV 91.9 80.0 - 100.0 fL   MCH 29.7 26.0 - 34.0 pg   MCHC 32.3 30.0 - 36.0 g/dL   RDW 95.6 21.3 - 08.6 %   Platelets 316 150 - 400 K/uL   nRBC 0.0 0.0 - 0.2 %    Comment: Performed at [facility], [address], [place], [place] [address]  Urinalysis, Routine w reflex microscopic -Urine, Clean Catch     Status: Abnormal   Collection Time: 06/16/22  2:48 PM  Result Value Ref Range   Color, Urine YELLOW YELLOW   APPearance HAZY (A) CLEAR   Specific Gravity, Urine 1.008 1.005 - 1.030   pH 7.0 5.0 - 8.0   Glucose, UA NEGATIVE NEGATIVE mg/dL   Hgb urine dipstick NEGATIVE NEGATIVE   Bilirubin Urine NEGATIVE NEGATIVE   Ketones, ur NEGATIVE NEGATIVE mg/dL   Protein, ur NEGATIVE NEGATIVE mg/dL   Nitrite NEGATIVE NEGATIVE   Leukocytes,Ua TRACE (A) NEGATIVE   RBC / HPF 0-5 0 - 5 RBC/hpf   WBC, UA 6-10 0 - 5 WBC/hpf   Bacteria, UA MANY (A) NONE SEEN   Squamous Epithelial / HPF 6-10 0 - 5 /HPF    Comment: Performed at [facility], [address], [place], [place] [address]  I-Stat beta hCG blood, ED     Status: None   Collection Time: 06/16/22  2:53 PM  Result Value Ref Range   I-stat hCG, quantitative <5.0 <5 mIU/mL   Comment 3            Comment:   GEST. AGE      CONC.  (mIU/mL)   <=1 WEEK        5 - 50     2 WEEKS       50 - 500     3 WEEKS       100 - 10,000     4 WEEKS     1,000 - 30,000        FEMALE AND NON-PREGNANT FEMALE:     LESS THAN 5 mIU/mL   CBG monitoring, ED     Status: Abnormal   Collection Time:  06/16/22  3:08 PM  Result  Value Ref Range   Glucose-Capillary 22 (LL) 70 - 99 mg/dL    Comment: Glucose reference range applies only to samples taken after fasting for at least 8 hours.   Comment 1 Document in Chart   CBG monitoring, ED (now and then every hour for 3 hours)     Status: Abnormal   Collection Time: 06/16/22  3:33 PM  Result Value Ref Range   Glucose-Capillary 205 (H) 70 - 99 mg/dL    Comment: Glucose reference range applies only to samples taken after fasting for at least 8 hours.  CBG monitoring, ED (now and then every hour for 3 hours)     Status: None   Collection Time: 06/16/22  5:11 PM  Result Value Ref Range   Glucose-Capillary 95 70 - 99 mg/dL    Comment: Glucose reference range applies only to samples taken after fasting for at least 8 hours.   CT Renal Stone Study  Result Date: 06/16/2022 CLINICAL DATA:  Left flank pain and dizziness associated with intermittent difficulty urinating EXAM: CT ABDOMEN AND PELVIS WITHOUT CONTRAST TECHNIQUE: Multidetector CT imaging of the abdomen and pelvis was performed following the standard protocol without IV contrast. RADIATION DOSE REDUCTION: This exam was performed according to the departmental dose-optimization program which includes automated exposure control, adjustment of the mA and/or kV according to patient size and/or use of iterative reconstruction technique. COMPARISON:  CT abdomen and pelvis dated 08/01/2018 FINDINGS: Lower chest: No focal consolidation or pulmonary nodule in the lung bases. No pleural effusion or pneumothorax demonstrated. Partially imaged heart size is normal. Hepatobiliary: No focal hepatic lesions. No intra or extrahepatic biliary ductal dilation. Cholelithiasis. Pancreas: No focal lesions or main ductal dilation. Spleen: Normal in size without focal abnormality. Adrenals/Urinary Tract: No adrenal nodules. No suspicious renal mass, calculi or hydronephrosis. No focal bladder wall thickening. Stomach/Bowel:  Normal appearance of the stomach. No evidence of bowel wall thickening, distention, or inflammatory changes. Normal appendix. Vascular/Lymphatic: No significant vascular findings are present. Bilateral external iliac lymphadenopathy measuring up to 14 mm (2:80, 82), previously 7 mm on the right and 12 mm on the left. Reproductive: No adnexal masses. Other: No free fluid, fluid collection, or free air. Musculoskeletal: No acute or abnormal lytic or blastic osseous lesions. Subcutaneous emphysema and calcified granulomata in the bilateral gluteal regions may be injection related. Partially imaged T11 vertebral segmentation anomaly. IMPRESSION: 1. Slight interval increase in size of bilateral external iliac lymphadenopathy measuring up to 14 mm, previously 7 mm on the right and 12 mm on the left. While findings are nonspecific and may be reactive, recommend clinical and laboratory evaluation for lymphoproliferative process. 2. Otherwise, no acute infectious/inflammatory findings in the abdomen or pelvis. Electronically Signed   By: Agustin Cree M.D.   On: 06/16/2022 16:10    Pending Labs Unresulted Labs (From admission, onward)     Start     Ordered   06/17/22 0500  CBC  Tomorrow morning,   R        06/16/22 1746   06/17/22 0500  Comprehensive metabolic panel  Tomorrow morning,   R        06/16/22 1746   06/16/22 1746  TSH  Once,   R        06/16/22 1746   06/16/22 1744  CBC  (heparin)  Once,   R       Comments: Baseline for heparin therapy IF NOT ALREADY DRAWN.  Notify MD if PLT < 100 K.  06/16/22 1746   06/16/22 1744  Creatinine, serum  (heparin)  Once,   R       Comments: Baseline for heparin therapy IF NOT ALREADY DRAWN.    06/16/22 1746   06/16/22 1743  HIV Antibody (routine testing w rflx)  (HIV Antibody (Routine testing w reflex) panel)  Once,   R        06/16/22 1746   06/16/22 1743  Hemoglobin A1c  (Glycemic Control (SSI)  Q 4 Hours / Glycemic Control (SSI)  AC +/- HS)  Once,   R        Comments: To assess prior glycemic control    06/16/22 1746            Vitals/Pain Today's Vitals   06/16/22 1530 06/16/22 1615 06/16/22 1630 06/16/22 1714  BP: (!) 130/96 128/89 134/88   Pulse: (!) 109 (!) 118 (!) 117   Resp: 19 20 20    Temp:    99.3 F (37.4 C)  TempSrc:    Oral  SpO2: 99% 99% 99%   Weight:      Height:      PainSc:        Isolation Precautions No active isolations  Medications Medications  sodium chloride flush (NS) 0.9 % injection 3 mL (3 mLs Intravenous Given 06/16/22 1524)  sodium chloride flush (NS) 0.9 % injection 3 mL (has no administration in time range)  0.9 %  sodium chloride infusion (has no administration in time range)  heparin injection 5,000 Units (has no administration in time range)  acetaminophen (TYLENOL) tablet 650 mg (has no administration in time range)    Or  acetaminophen (TYLENOL) suppository 650 mg (has no administration in time range)  HYDROcodone-acetaminophen (NORCO/VICODIN) 5-325 MG per tablet 1-2 tablet (has no administration in time range)  ondansetron (ZOFRAN) tablet 4 mg (has no administration in time range)    Or  ondansetron (ZOFRAN) injection 4 mg (has no administration in time range)  ketorolac (TORADOL) 15 MG/ML injection 15 mg (has no administration in time range)  albuterol (PROVENTIL) (2.5 MG/3ML) 0.083% nebulizer solution 2.5 mg (has no administration in time range)  dextrose 50 % solution 50 mL (50 mLs Intravenous Given 06/16/22 1522)  lactated ringers bolus 1,000 mL (0 mLs Intravenous Stopped 06/16/22 1730)  cefTRIAXone (ROCEPHIN) 1 g in sodium chloride 0.9 % 100 mL IVPB (0 g Intravenous Stopped 06/16/22 1730)    Mobility walks     Focused Assessments    R Recommendations: See Admitting Provider Note  Report given to:   Additional Notes:

## 2022-06-16 NOTE — ED Notes (Signed)
Pt awake, alert and oriented. Dr. Karene Fry notified that blood glucose reading of 22 was obtained from finger and pt has Raynaud's. Blood glucose will be rechecked from earlobe to ensure accuracy.

## 2022-06-16 NOTE — ED Provider Notes (Signed)
[place] EMERGENCY DEPARTMENT AT [facility] Provider Note   CSN: [id number] Arrival date & time: 06/16/22  1404     History  Chief Complaint  Patient presents with   Flank Pain   Dizziness    Emily Hicks is a 37 y.o. female.   Flank Pain  Dizziness    37 year old female with medical history significant for MS, lupus, Raynaud's syndrome, GERD, gallstones, on immunosuppression who presents to the emergency department with a chief complaint of fatigue, lightheadedness, left-sided flank pain, difficulty intermittently urinating.  The patient also was feeling lightheaded and fatigued and was found to have a blood sugar of 70 with EMS and was given oral glucose.  She denies any chest pain, shortness of breath, fever, endorses mild chills.  Home Medications Prior to Admission medications   Medication Sig Start Date End Date Taking? Authorizing Provider  amLODipine (NORVASC) 5 MG tablet Take 1 tablet (5 mg total) by mouth daily. Hold this medication for now, please check your blood pressure daily, bring record for your pcp to review, further meds adjustment for blood pressure per pcp 06/15/21   Albertine Grates, MD  Anifrolumab-fnia [facility]) 300 MG/2ML SOLN Inject 300 mg into the vein every 30 (thirty) days. 06/03/21   [provider]  azaTHIOprine (IMURAN) 50 MG tablet See admin instructions. 06/25/21   [provider]  clobetasol ointment (TEMOVATE) 0.05 % Apply 1 application. topically 2 (two) times daily. 06/04/21   [provider]  colchicine 0.6 MG tablet Take 1 tablet (0.6 mg total) by mouth 2 (two) times daily. 06/15/21 10/13/21  Albertine Grates, MD  hydroxychloroquine (PLAQUENIL) 200 MG tablet Take 200 mg by mouth daily.    [provider]  metoprolol succinate (TOPROL-XL) 100 MG 24 hr tablet Take 1 tablet (100 mg total) by mouth daily. Take with or immediately following a meal. 06/15/21   Albertine Grates, MD  Vitamin D, Ergocalciferol, (DRISDOL) 1.25 MG  (50000 UNIT) CAPS capsule Take 1 capsule (50,000 Units total) by mouth every 7 (seven) days. 08/16/21   Marcine Matar, MD      Allergies    Patient has no known allergies.    Review of Systems   Review of Systems  Constitutional:  Positive for chills and fatigue.  Genitourinary:  Positive for flank pain.  Neurological:  Positive for dizziness.  All other systems reviewed and are negative.   Physical Exam Updated Vital Signs BP (!) 130/96   Pulse (!) 109   Temp 98.2 F (36.8 C) (Oral)   Resp 19   Ht 5\' 1"  (1.549 m)   Wt 77.1 kg   SpO2 99%   BMI 32.12 kg/m  Physical Exam Vitals and nursing note reviewed.  Constitutional:      General: She is not in acute distress.    Appearance: She is well-developed.  HENT:     Head: Normocephalic and atraumatic.  Eyes:     Conjunctiva/sclera: Conjunctivae normal.  Cardiovascular:     Rate and Rhythm: Regular rhythm. Tachycardia present.     Pulses: Normal pulses.  Pulmonary:     Effort: Pulmonary effort is normal. No respiratory distress.     Breath sounds: Normal breath sounds.  Abdominal:     Palpations: Abdomen is soft.     Tenderness: There is abdominal tenderness. There is left CVA tenderness.  Musculoskeletal:        General: No swelling.     Cervical back: Neck supple.  Skin:  General: Skin is warm and dry.     Capillary Refill: Capillary refill takes less than 2 seconds.  Neurological:     Mental Status: She is alert.  Psychiatric:        Mood and Affect: Mood normal.     ED Results / Procedures / Treatments   Labs (all labs ordered are listed, but only abnormal results are displayed) Labs Reviewed  BASIC METABOLIC PANEL - Abnormal; Notable for the following components:      Result Value   Potassium 3.4 (*)    Calcium 8.6 (*)    All other components within normal limits  CBC - Abnormal; Notable for the following components:   WBC 3.5 (*)    RBC 3.84 (*)    Hemoglobin 11.4 (*)    HCT 35.3 (*)    All  other components within normal limits  URINALYSIS, ROUTINE W REFLEX MICROSCOPIC - Abnormal; Notable for the following components:   APPearance HAZY (*)    Leukocytes,Ua TRACE (*)    Bacteria, UA MANY (*)    All other components within normal limits  CBG MONITORING, ED - Abnormal; Notable for the following components:   Glucose-Capillary 59 (*)    All other components within normal limits  CBG MONITORING, ED - Abnormal; Notable for the following components:   Glucose-Capillary 63 (*)    All other components within normal limits  CBG MONITORING, ED - Abnormal; Notable for the following components:   Glucose-Capillary 22 (*)    All other components within normal limits  CBG MONITORING, ED - Abnormal; Notable for the following components:   Glucose-Capillary 205 (*)    All other components within normal limits  I-STAT BETA HCG BLOOD, ED ([facility], [facility], [facility] ONLY)  CBG MONITORING, ED  CBG MONITORING, ED    EKG EKG Interpretation  Date/Time:  Monday Jun 16 2022 14:22:52 EDT Ventricular Rate:  101 PR Interval:  156 QRS Duration: 81 QT Interval:  324 QTC Calculation: 420 R Axis:   51 Text Interpretation: Sinus tachycardia Borderline T wave abnormalities Confirmed by Ernie Avena ([id number]) on 06/16/2022 2:28:54 PM  Radiology CT Renal Stone Study  Result Date: 06/16/2022 CLINICAL DATA:  Left flank pain and dizziness associated with intermittent difficulty urinating EXAM: CT ABDOMEN AND PELVIS WITHOUT CONTRAST TECHNIQUE: Multidetector CT imaging of the abdomen and pelvis was performed following the standard protocol without IV contrast. RADIATION DOSE REDUCTION: This exam was performed according to the departmental dose-optimization program which includes automated exposure control, adjustment of the mA and/or kV according to patient size and/or use of iterative reconstruction technique. COMPARISON:  CT abdomen and pelvis dated 08/01/2018 FINDINGS: Lower chest: No focal consolidation or pulmonary  nodule in the lung bases. No pleural effusion or pneumothorax demonstrated. Partially imaged heart size is normal. Hepatobiliary: No focal hepatic lesions. No intra or extrahepatic biliary ductal dilation. Cholelithiasis. Pancreas: No focal lesions or main ductal dilation. Spleen: Normal in size without focal abnormality. Adrenals/Urinary Tract: No adrenal nodules. No suspicious renal mass, calculi or hydronephrosis. No focal bladder wall thickening. Stomach/Bowel: Normal appearance of the stomach. No evidence of bowel wall thickening, distention, or inflammatory changes. Normal appendix. Vascular/Lymphatic: No significant vascular findings are present. Bilateral external iliac lymphadenopathy measuring up to 14 mm (2:80, 82), previously 7 mm on the right and 12 mm on the left. Reproductive: No adnexal masses. Other: No free fluid, fluid collection, or free air. Musculoskeletal: No acute or abnormal lytic or blastic osseous lesions. Subcutaneous emphysema and calcified granulomata  in the bilateral gluteal regions may be injection related. Partially imaged T11 vertebral segmentation anomaly. IMPRESSION: 1. Slight interval increase in size of bilateral external iliac lymphadenopathy measuring up to 14 mm, previously 7 mm on the right and 12 mm on the left. While findings are nonspecific and may be reactive, recommend clinical and laboratory evaluation for lymphoproliferative process. 2. Otherwise, no acute infectious/inflammatory findings in the abdomen or pelvis. Electronically Signed   By: Agustin Cree M.D.   On: 06/16/2022 16:10    Procedures .Critical Care  Performed by: Ernie Avena, MD Authorized by: Ernie Avena, MD   Critical care provider statement:    Critical care time (minutes):  30   Critical care was necessary to treat or prevent imminent or life-threatening deterioration of the following conditions:  Endocrine crisis   Critical care was time spent personally by me on the following activities:   Development of treatment plan with patient or surrogate, discussions with consultants, evaluation of patient's response to treatment, examination of patient, ordering and review of laboratory studies, ordering and review of radiographic studies, ordering and performing treatments and interventions, pulse oximetry, re-evaluation of patient's condition and review of old charts   Care discussed with: admitting provider       Medications Ordered in ED Medications  sodium chloride flush (NS) 0.9 % injection 3 mL (3 mLs Intravenous Given 06/16/22 1524)  sodium chloride flush (NS) 0.9 % injection 3 mL (has no administration in time range)  0.9 %  sodium chloride infusion (has no administration in time range)  cefTRIAXone (ROCEPHIN) 1 g in sodium chloride 0.9 % 100 mL IVPB (1 g Intravenous New Bag/Given 06/16/22 1630)  dextrose 50 % solution 50 mL (50 mLs Intravenous Given 06/16/22 1522)  lactated ringers bolus 1,000 mL (1,000 mLs Intravenous New Bag/Given 06/16/22 1556)    ED Course/ Medical Decision Making/ A&P Clinical Course as of 06/16/22 1631  Mon Jun 16, 2022  1511 Glucose-Capillary(!!): 22 [JL]  1511 Glucose-Capillary(!!): 22 [JL]    Clinical Course User Index [JL] Ernie Avena, MD                             Medical Decision Making Amount and/or Complexity of Data Reviewed Labs: ordered. Decision-making details documented in ED Course. Radiology: ordered.  Risk Prescription drug management. Decision regarding hospitalization.    37 year old female with medical history significant for MS, lupus, Raynaud's syndrome, GERD, gallstones, on immunosuppression who presents to the emergency department with a chief complaint of fatigue, lightheadedness, left-sided flank pain, difficulty intermittently urinating.  The patient also was feeling lightheaded and fatigued and was found to have a blood sugar of 70 with EMS and was given oral glucose.  She denies any chest pain, shortness of  breath, fever, endorses mild chills.  On arrival, the patient was afebrile, tachycardic heart rate 103, BP 138/87, saturating 100% room air.  Physical exam significant for left-sided CVA tenderness, sinus tachycardia noted.  Concern for developing infection/sepsis from urinary source, concern for intra-abdominal source, considered nephrolithiasis.    Laboratory evaluation significant for CBG of 22.  The patient was administered D50 with improvement to 205 on recheck.  CBC revealed a leukopenia to 3.5, anemia to 11.4, BMP without evidence of AKI, no significant electrolyte abnormality, mild hypokalemia 3.4 noted.  Urinalysis was positive for UTI with trace leukocytes, 6-10 WBCs and many bacteria.  Patient meeting SIRS criteria, tachycardic, leukopenia noted, evidence of UTI with intermittent difficulty  urinating.  CT stone study was performed: IMPRESSION:  1. Slight interval increase in size of bilateral external iliac  lymphadenopathy measuring up to 14 mm, previously 7 mm on the right  and 12 mm on the left. While findings are nonspecific and may be  reactive, recommend clinical and laboratory evaluation for  lymphoproliferative process.  2. Otherwise, no acute infectious/inflammatory findings in the  abdomen or pelvis.    Have concern for developing pyelonephritis.  I recommended admission for observation for IV antibiotics.  IV Rocephin was administered in addition to an IV fluid bolus following correction of the patient's blood glucose.  I also recommended admission for observation in the setting of the patient's hypoglycemic episode today.  Hospitalist medicine consulted for admission.   Final Clinical Impression(s) / ED Diagnoses Final diagnoses:  Hypoglycemia  Pyelonephritis  Systemic lupus erythematosus, unspecified SLE type, unspecified organ involvement status [facility])    Rx / DC Orders ED Discharge Orders     None         Ernie Avena, MD 06/16/22 1631

## 2022-06-16 NOTE — ED Notes (Signed)
Pt to ct via stretcher

## 2022-06-16 NOTE — ED Notes (Signed)
Pt was given a [place] sandwich and a cranberry juice.

## 2022-06-16 NOTE — ED Triage Notes (Signed)
Left sided flank pain, dizziness, feeling like she is going to pass out. Intermittent difficulty urinating, denies pain or burning with urination. Intermittent left sided chest pain BS was 70 with EMS, EMS gave oral glucose Hx of lupus, thinks she is having a flare up

## 2022-06-16 NOTE — H&P (Addendum)
History and Physical    Emily Hicks ZOX:[id number] DOB: May 22, 1985 DOA: 06/16/2022  PCP: Marcine Matar, MD  Patient coming from: home  I have personally briefly reviewed patient's old medical records in [facility]  Chief Complaint: left sided flank pain , dizziness/ difficulty urinating  HPI: Emily Hicks is a 37 y.o. 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 However notes has been having urinary urgency x 1 week. She notes no n/v/ or fever or chills at home. She also denies cough, uri sxs/ or sick contact.  She notes she was diagnosed with lupus 4 years ago and does not have set typical symptoms with each flare.  In reference to her history of pericarditis she notes she  has f/u with cardiologist in July. She does not have chest pain currently or pleurisy.   ED Course:  Vitals:  Tmx 101.1, bp 138/87, hr 103, rr 19 sat 100% Wbc 3.5, K 11.4, UA: abn + bacteria + squams normal wbc  Fs 59,63  CTAB/pelvis IMPRESSION: 1. Slight interval increase in size of bilateral external iliac lymphadenopathy measuring up to 14 mm, previously 7 mm on the right and 12 mm on the left. While findings are nonspecific and may be reactive, recommend clinical and laboratory evaluation for lymphoproliferative process. 2. Otherwise, no acute infectious/inflammatory findings in the abdomen or pelvis.    EKG:  NS no hyperacute st-twave changes , tx LR ctx   Review of Systems: As per HPI otherwise 10 point review of systems negative.   Past Medical History:  Diagnosis Date   Cervical intraepithelial neoplasia III    Gallstones    GERD (gastroesophageal reflux disease)    only prn OTC occasionally   History of blood transfusion 2020   Lupus  (HCC) dx 2020   Multiple sclerosis (HCC) dx 2020   Numbness and tingling of left leg     Past Surgical History:  Procedure Laterality Date   CERVICAL CONIZATION W/BX N/A 01/16/2020   Procedure: CERVICAL CONE BIOPSY, ENDOCERVICAL CURETTAGE;  Surgeon: Theresia Majors, MD;  Location: [facility];  Service: Gynecology;  Laterality: N/A;  request 7:30am OR start in [place] [facility] block requests 45 minutes   CHOLECYSTECTOMY N/A 12/28/2013   Procedure: LAPAROSCOPIC CHOLECYSTECTOMY;  Surgeon: Claud Kelp, MD;  Location: [facility];  Service: General;  Laterality: N/A;     reports that she has never smoked. She has never used smokeless tobacco. She reports that she does not drink alcohol and does not use drugs.  No Known Allergies  Family History  Problem Relation Age of Onset   Hypertension Father    Hypertension Mother    Hypertension Sister    Diabetes Maternal Grandmother    Diabetes Maternal Grandfather     Prior to Admission medications   Medication Sig Start Date End Date Taking? Authorizing Provider  amLODipine (NORVASC) 5 MG tablet Take 1 tablet (5 mg total) by mouth daily. Hold this medication for now, please check your blood pressure daily, bring record for your pcp to review, further meds adjustment for blood pressure per pcp 06/15/21   Albertine Grates, MD  Anifrolumab-fnia [facility]) 300 MG/2ML SOLN Inject 300 mg into the vein every 30 (thirty) days. 06/03/21   [provider]  azaTHIOprine (  IMURAN) 50 MG tablet See admin instructions. 06/25/21   [provider]  clobetasol ointment (TEMOVATE) 0.05 % Apply 1 application. topically 2 (two) times daily. 06/04/21   [provider]  colchicine 0.6 MG tablet Take 1 tablet (0.6 mg total) by mouth 2 (two) times daily. 06/15/21 10/13/21  Albertine Grates, MD  hydroxychloroquine (PLAQUENIL) 200 MG tablet Take 200 mg by mouth daily.    [provider]  metoprolol succinate (TOPROL-XL) 100 MG 24 hr tablet Take  1 tablet (100 mg total) by mouth daily. Take with or immediately following a meal. 06/15/21   Albertine Grates, MD  Vitamin D, Ergocalciferol, (DRISDOL) 1.25 MG (50000 UNIT) CAPS capsule Take 1 capsule (50,000 Units total) by mouth every 7 (seven) days. 08/16/21   Marcine Matar, MD    Physical Exam: Vitals:   06/16/22 1530 06/16/22 1615 06/16/22 1630 06/16/22 1714  BP: (!) 130/96 128/89 134/88   Pulse: (!) 109 (!) 118 (!) 117   Resp: 19 20 20    Temp:    99.3 F (37.4 C)  TempSrc:    Oral  SpO2: 99% 99% 99%   Weight:      Height:        Constitutional: NAD, calm, comfortable Vitals:   06/16/22 1530 06/16/22 1615 06/16/22 1630 06/16/22 1714  BP: (!) 130/96 128/89 134/88   Pulse: (!) 109 (!) 118 (!) 117   Resp: 19 20 20    Temp:    99.3 F (37.4 C)  TempSrc:    Oral  SpO2: 99% 99% 99%   Weight:      Height:       Eyes: PERRL, lids and conjunctivae normal ENMT: Mucous membranes are moist. Posterior pharynx clear of any exudate or lesions.Normal dentition.  Neck: normal, supple, no masses, no thyromegaly Respiratory: clear to auscultation bilaterally, no wheezing, no crackles. Normal respiratory effort. No accessory muscle use.  Cardiovascular: Regular rate and rhythm, no murmurs / rubs / gallops. No extremity edema. 2+ pedal pulses. No carotid bruits.  Abdomen: no tenderness, no masses palpated. No hepatosplenomegaly. Bowel sounds positive.  Musculoskeletal: no clubbing / cyanosis. No joint deformity upper and lower extremities. Good ROM, no contractures. Normal muscle tone.  Cva tenderness on left  Skin: no rashes, lesions, ulcers. No induration Neurologic: CN 2-12 grossly intact. Sensation intact, Strength 5/5 in all 4.  Psychiatric: Normal judgment and insight. Alert and oriented x 3. Normal mood.    Labs on Admission: I have personally reviewed following labs and imaging studies  CBC: Recent Labs  Lab 06/11/22 1135 06/16/22 1433  WBC 3.4* 3.5*  HGB 12.1 11.4*  HCT 37.4  35.3*  MCV 91.7 91.9  PLT 299 316   Basic Metabolic Panel: Recent Labs  Lab 06/11/22 1135 06/16/22 1433  NA 140 139  K 3.4* 3.4*  CL 107 107  CO2 24 26  GLUCOSE 92 72  BUN 9 9  CREATININE 0.76 0.79  CALCIUM 8.6* 8.6*   GFR: Estimated Creatinine Clearance: 92.2 mL/min (by C-G formula based on SCr of 0.79 mg/dL). Liver Function Tests: No results for input(s): "AST", "ALT", "ALKPHOS", "BILITOT", "PROT", "ALBUMIN" in the last 168 hours. No results for input(s): "LIPASE", "AMYLASE" in the last 168 hours. No results for input(s): "AMMONIA" in the last 168 hours. Coagulation Profile: No results for input(s): "INR", "PROTIME" in the last 168 hours. Cardiac Enzymes: No results for input(s): "CKTOTAL", "CKMB", "CKMBINDEX", "TROPONINI" in the last 168 hours. BNP (last 3 results) No results for input(s): "  PROBNP" in the last 8760 hours. HbA1C: No results for input(s): "HGBA1C" in the last 72 hours. CBG: Recent Labs  Lab 06/16/22 1431 06/16/22 1432 06/16/22 1508 06/16/22 1533 06/16/22 1711  GLUCAP 59* 63* 22* 205* 95   Lipid Profile: No results for input(s): "CHOL", "HDL", "LDLCALC", "TRIG", "CHOLHDL", "LDLDIRECT" in the last 72 hours. Thyroid Function Tests: No results for input(s): "TSH", "T4TOTAL", "FREET4", "T3FREE", "THYROIDAB" in the last 72 hours. Anemia Panel: No results for input(s): "VITAMINB12", "FOLATE", "FERRITIN", "TIBC", "IRON", "RETICCTPCT" in the last 72 hours. Urine analysis:    Component Value Date/Time   COLORURINE YELLOW 06/16/2022 1448   APPEARANCEUR HAZY (A) 06/16/2022 1448   LABSPEC 1.008 06/16/2022 1448   PHURINE 7.0 06/16/2022 1448   GLUCOSEU NEGATIVE 06/16/2022 1448   HGBUR NEGATIVE 06/16/2022 1448   BILIRUBINUR NEGATIVE 06/16/2022 1448   BILIRUBINUR Neg 05/08/2010 1553   KETONESUR NEGATIVE 06/16/2022 1448   PROTEINUR NEGATIVE 06/16/2022 1448   UROBILINOGEN 1.0 11/11/2013 0150   NITRITE NEGATIVE 06/16/2022 1448   LEUKOCYTESUR TRACE (A)  06/16/2022 1448    Radiological Exams on Admission: CT Renal Stone Study  Result Date: 06/16/2022 CLINICAL DATA:  Left flank pain and dizziness associated with intermittent difficulty urinating EXAM: CT ABDOMEN AND PELVIS WITHOUT CONTRAST TECHNIQUE: Multidetector CT imaging of the abdomen and pelvis was performed following the standard protocol without IV contrast. RADIATION DOSE REDUCTION: This exam was performed according to the departmental dose-optimization program which includes automated exposure control, adjustment of the mA and/or kV according to patient size and/or use of iterative reconstruction technique. COMPARISON:  CT abdomen and pelvis dated 08/01/2018 FINDINGS: Lower chest: No focal consolidation or pulmonary nodule in the lung bases. No pleural effusion or pneumothorax demonstrated. Partially imaged heart size is normal. Hepatobiliary: No focal hepatic lesions. No intra or extrahepatic biliary ductal dilation. Cholelithiasis. Pancreas: No focal lesions or main ductal dilation. Spleen: Normal in size without focal abnormality. Adrenals/Urinary Tract: No adrenal nodules. No suspicious renal mass, calculi or hydronephrosis. No focal bladder wall thickening. Stomach/Bowel: Normal appearance of the stomach. No evidence of bowel wall thickening, distention, or inflammatory changes. Normal appendix. Vascular/Lymphatic: No significant vascular findings are present. Bilateral external iliac lymphadenopathy measuring up to 14 mm (2:80, 82), previously 7 mm on the right and 12 mm on the left. Reproductive: No adnexal masses. Other: No free fluid, fluid collection, or free air. Musculoskeletal: No acute or abnormal lytic or blastic osseous lesions. Subcutaneous emphysema and calcified granulomata in the bilateral gluteal regions may be injection related. Partially imaged T11 vertebral segmentation anomaly. IMPRESSION: 1. Slight interval increase in size of bilateral external iliac lymphadenopathy  measuring up to 14 mm, previously 7 mm on the right and 12 mm on the left. While findings are nonspecific and may be reactive, recommend clinical and laboratory evaluation for lymphoproliferative process. 2. Otherwise, no acute infectious/inflammatory findings in the abdomen or pelvis. Electronically Signed   By: Agustin Cree M.D.   On: 06/16/2022 16:10    EKG: Independently reviewed. See above   Assessment/Plan  Presumed Complicated Urinary tract infection in immunocompromised patient  - continue CTX  - repeat UA due to poor specimen, and f/u on culture data   Hypoglycemia -responsive to d50  -no hx of DMII - A1c pending  -monitor q4h rfs  SIRS -concern for other possible source of infection vs lupus flare with serositis  - will f/u with blood cultures, dedicated chest imaging, respiratory panel,lactic acid -check inflammatory markers and complements  -supportive ivfs  Hx of Tachycardia NOS  - however with fever appears appropriate  - will check tsh to be complete   Hx of hypertension  -resume amlodipine /metoprolol  as able   Lupus  -concern for possible flare  - however due to concern for possible infection will hold immunosuppressives until infection rules out  -resume home regimen as able  -if note low complements consider rheum consult        DVT prophylaxis: heparin Code Status: full/ as discussed per patient wishes in event of cardiac arrest  Family Communication: Mcgriff,Kadeem (Spouse) [phone] (Mobile)  at bedside Disposition Plan: patient  expected to be admitted greater than 2 midnights  Consults called: n/a Admission status: med tele   Lurline Del MD [facility]   If 7PM-7AM, please contact night-coverage [email or web address] Password [facility]  06/16/2022, 5:47 PM

## 2022-06-16 NOTE — ED Notes (Signed)
Pt given juice and sandwich for low blood glucose. Will recheck after pt eats

## 2022-06-17 DIAGNOSIS — N390 Urinary tract infection, site not specified: Secondary | ICD-10-CM | POA: Diagnosis not present

## 2022-06-17 LAB — RESPIRATORY PANEL BY PCR

## 2022-06-17 LAB — CBC
HCT: 31.8 % — ABNORMAL LOW (ref 36.0–46.0)
Hemoglobin: 10.3 g/dL — ABNORMAL LOW (ref 12.0–15.0)
MCH: 29.8 pg (ref 26.0–34.0)
MCHC: 32.4 g/dL (ref 30.0–36.0)
MCV: 91.9 fL (ref 80.0–100.0)
Platelets: 283 10*3/uL (ref 150–400)
RBC: 3.46 MIL/uL — ABNORMAL LOW (ref 3.87–5.11)
RDW: 13.1 % (ref 11.5–15.5)
WBC: 3.5 10*3/uL — ABNORMAL LOW (ref 4.0–10.5)
nRBC: 0 % (ref 0.0–0.2)

## 2022-06-17 LAB — COMPREHENSIVE METABOLIC PANEL
ALT: 10 U/L (ref 0–44)
AST: 15 U/L (ref 15–41)
Albumin: 2.7 g/dL — ABNORMAL LOW (ref 3.5–5.0)
Alkaline Phosphatase: 50 U/L (ref 38–126)
Anion gap: 5 (ref 5–15)
BUN: 12 mg/dL (ref 6–20)
CO2: 25 mmol/L (ref 22–32)
Calcium: 8.6 mg/dL — ABNORMAL LOW (ref 8.9–10.3)
Chloride: 109 mmol/L (ref 98–111)
Creatinine, Ser: 0.65 mg/dL (ref 0.44–1.00)
GFR, Estimated: >60 mL/min (ref >60)
Glucose, Bld: 91 mg/dL (ref 70–99)
Potassium: 3.6 mmol/L (ref 3.5–5.1)
Sodium: 139 mmol/L (ref 135–145)
Total Bilirubin: 0.3 mg/dL (ref 0.3–1.2)
Total Protein: 5.9 g/dL — ABNORMAL LOW (ref 6.5–8.1)

## 2022-06-17 LAB — GLUCOSE, CAPILLARY
Glucose-Capillary: 111 mg/dL — ABNORMAL HIGH (ref 70–99)
Glucose-Capillary: 111 mg/dL — ABNORMAL HIGH (ref 70–99)
Glucose-Capillary: 116 mg/dL — ABNORMAL HIGH (ref 70–99)
Glucose-Capillary: 67 mg/dL — ABNORMAL LOW (ref 70–99)
Glucose-Capillary: 83 mg/dL (ref 70–99)
Glucose-Capillary: 89 mg/dL (ref 70–99)
Glucose-Capillary: 97 mg/dL (ref 70–99)

## 2022-06-17 LAB — CULTURE, BLOOD (ROUTINE X 2): Special Requests: ADEQUATE

## 2022-06-17 LAB — HIV ANTIBODY (ROUTINE TESTING W REFLEX): HIV Screen 4th Generation wRfx: NONREACTIVE

## 2022-06-17 LAB — LACTIC ACID, PLASMA: Lactic Acid, Venous: 1 mmol/L (ref 0.5–1.9)

## 2022-06-17 MED ORDER — PREDNISONE 10 MG PO TABS
10.0000 mg | ORAL_TABLET | Freq: Every day | ORAL | Status: DC
  Administered 2022-06-17 – 2022-06-18 (×2): 10 mg via ORAL
  Filled 2022-06-17 (×2): qty 1

## 2022-06-17 NOTE — Progress Notes (Addendum)
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  She has been having the symptoms for the past 3 days.  Urinary urgency was for the past week.  She denies any proper burning but difficulties in urinating.  No nausea, vomiting, fever or chills.  On admission she also complained of chest pain.  CT angio in the ED without evidence of pulmonary emboli or any other acute abnormalities, and a CT renal stone showed slight interval increase in external iliac lymphadenopathy, possibly reactive versus lymphoproliferative process.  She was also febrile to 101.1 in the ED last night  Subjective / 24h Interval events: Doing well this morning, low-grade temp this morning of 99, feels well overall.  Denies any flank pain.  No nausea or vomiting.  No chest pain, no shortness of breath.  Assesement and Plan: Principal problem SIRS -patient with fever, leukopenia, tachycardia in the ED.  Presumed urinary source given symptoms with flank pain as well as urgency, however urinalysis looks fairly unremarkable.  Monitor cultures  Active problems Lupus -has a history of pericarditis about a year ago.  Continue home medications with Plaquenil as well as prednisone.  Hold Imuran for now.  Fortunately CRP as well as sed rate are normal suggesting against lupus flareup.  C3-4 complements are pending though  Hypoglycemia -possibly related to #1.  She is not diabetic, A1c is 5.4.  Encourage p.o. intake  History of MS-outpatient follow up  Obesity, class I-based on a BMI of 32.  Scheduled Meds:  heparin  5,000 Units Subcutaneous Q8H   predniSONE  10 mg Oral Q breakfast   sodium chloride flush  3 mL  Intravenous Q12H   Continuous Infusions:  sodium chloride     cefTRIAXone (ROCEPHIN)  IV     PRN Meds:.sodium chloride, acetaminophen **OR** acetaminophen, albuterol, HYDROcodone-acetaminophen, ketorolac, ondansetron **OR** ondansetron (ZOFRAN) IV, sodium chloride flush  Current Outpatient Medications  Medication Instructions   amLODipine (NORVASC) 5 mg, Oral, Daily, Hold this medication for now, please check your blood pressure daily, bring record for your pcp to review, further meds adjustment for blood pressure per pcp   azathioprine (IMURAN) 150 mg, Oral, Daily   clobetasol ointment (TEMOVATE) 0.05 % 1 application , Topical, 2 times daily PRN   colchicine 0.6 mg, Oral, 2 times daily   EXCEDRIN EXTRA STRENGTH 250-250-65 MG tablet 1 tablet, Oral, Every 6 hours PRN   hydroxychloroquine (PLAQUENIL) 200 mg, Oral, Daily   ibuprofen (ADVIL) 200-400 mg, Oral, Every 6 hours PRN   ketoconazole (NIZORAL) 2 % shampoo 1 Application, Topical, 2 times weekly   metoprolol succinate (TOPROL-XL) 100 mg, Oral, Daily, Take with or immediately following a meal.   predniSONE (DELTASONE) 10-40 mg, Oral, See admin instructions, Take 40 mg WITH FOOD by mouth once daily for 7 days then 30 mg once daily for 7 days then 20 mg once daily for 7 days then 10 mg once a day thereafter OR AS OTHERWISE INSTRUCTED   Saphnelo 300 mg, Every 30 days   Tylenol 325-650 mg, Oral, Every 6 hours PRN   Vitamin D (Ergocalciferol) (DRISDOL) 50,000 Units, Oral, Every 7 days    Diet Orders (From admission, onward)  Start     Ordered   06/16/22 1745  Diet regular Room service appropriate? Yes; Fluid consistency: Thin  Diet effective now       Question Answer Comment  Room service appropriate? Yes   Fluid consistency: Thin      06/16/22 1746            DVT prophylaxis: heparin injection 5,000 Units Start: 06/16/22 2200   Lab Results  Component Value Date   PLT 283 06/17/2022      Code Status: Full  Code  Family Communication: mother at bedside   Status is: Inpatient Remains inpatient appropriate because: severity of illness, febrile last night  Level of care: Telemetry  Consultants:  none  Objective: Vitals:   06/16/22 2041 06/16/22 2350 06/17/22 0407 06/17/22 0835  BP: 134/73 126/80 (!) 131/96 (!) 137/93  Pulse: (!) 119 95 93 92  Resp: 20 18 18 18   Temp: 98.5 F (36.9 C) 98.9 F (37.2 C) 99.8 F (37.7 C) 99.5 F (37.5 C)  TempSrc: Oral Oral Oral Oral  SpO2: 98% 100% 100% 100%  Weight:      Height:        Intake/Output Summary (Last 24 hours) at 06/17/2022 1035 Last data filed at 06/17/2022 1610 Gross per 24 hour  Intake 2220 ml  Output 1050 ml  Net 1170 ml   Wt Readings from Last 3 Encounters:  06/16/22 77.1 kg  06/11/22 77.1 kg  08/15/21 83.7 kg    Examination:  Constitutional: NAD Eyes: no scleral icterus ENMT: Mucous membranes are moist.  Neck: normal, supple Respiratory: clear to auscultation bilaterally, no wheezing, no crackles. Normal respiratory effort. No accessory muscle use.  Cardiovascular: Regular rate and rhythm, no murmurs / rubs / gallops. No LE edema.  Abdomen: non distended, no tenderness. Bowel sounds positive.  Musculoskeletal: no clubbing / cyanosis.   Data Reviewed: I have independently reviewed following labs and imaging studies   CBC Recent Labs  Lab 06/11/22 1135 06/16/22 1433 06/16/22 1835 06/17/22 0142  WBC 3.4* 3.5* 3.7* 3.5*  HGB 12.1 11.4* 11.6* 10.3*  HCT 37.4 35.3* 36.8 31.8*  PLT 299 316 313 283  MCV 91.7 91.9 92.9 91.9  MCH 29.7 29.7 29.3 29.8  MCHC 32.4 32.3 31.5 32.4  RDW 13.1 13.2 13.2 13.1    Recent Labs  Lab 06/11/22 1135 06/16/22 1433 06/16/22 1835 06/16/22 1835 06/16/22 2118 06/16/22 2334 06/17/22 0142  NA 140 139  --   --   --   --  139  K 3.4* 3.4*  --   --   --   --  3.6  CL 107 107  --   --   --   --  109  CO2 24 26  --   --   --   --  25  GLUCOSE 92 72  --   --   --   --  91  BUN 9  9  --   --   --   --  12  CREATININE 0.76 0.79 0.74  --   --   --  0.65  CALCIUM 8.6* 8.6*  --   --   --   --  8.6*  AST  --   --   --   --   --   --  15  ALT  --   --   --   --   --   --  10  ALKPHOS  --   --   --   --   --   --  50  BILITOT  --   --   --   --   --   --  0.3  ALBUMIN  --   --   --   --   --   --  2.7*  CRP  --   --   --   --  0.8  --   --   PROCALCITON  --   --   --   --  <0.10  --   --   LATICACIDVEN  --   --   --   --  1.3 1.0  --   TSH  --   --  0.436  --   --   --   --   HGBA1C  --   --  5.4   < > 5.4  --   --    < > = values in this interval not displayed.    ------------------------------------------------------------------------------------------------------------------ No results for input(s): "CHOL", "HDL", "LDLCALC", "TRIG", "CHOLHDL", "LDLDIRECT" in the last 72 hours.  Lab Results  Component Value Date   HGBA1C 5.4 06/16/2022   ------------------------------------------------------------------------------------------------------------------ Recent Labs    06/16/22 1835  TSH 0.436    Cardiac Enzymes No results for input(s): "CKMB", "TROPONINI", "MYOGLOBIN" in the last 168 hours.  Invalid input(s): "CK" ------------------------------------------------------------------------------------------------------------------    Component Value Date/Time   BNP 20.8 06/13/2021 1142    CBG: Recent Labs  Lab 06/16/22 2001 06/16/22 2350 06/17/22 0408 06/17/22 0447 06/17/22 0712  GLUCAP 88 99 67* 97 89    Recent Results (from the past 240 hour(s))  Respiratory (~20 pathogens) panel by PCR     Status: None   Collection Time: 06/16/22  8:00 PM   Specimen: Nasopharyngeal Swab; Respiratory  Result Value Ref Range Status   Adenovirus NOT DETECTED NOT DETECTED Final   Coronavirus 229E NOT DETECTED NOT DETECTED Final    Comment: (NOTE) The Coronavirus on the Respiratory Panel, DOES NOT test for the novel  Coronavirus (2019 nCoV)    Coronavirus HKU1  NOT DETECTED NOT DETECTED Final   Coronavirus NL63 NOT DETECTED NOT DETECTED Final   Coronavirus OC43 NOT DETECTED NOT DETECTED Final   Metapneumovirus NOT DETECTED NOT DETECTED Final   Rhinovirus / Enterovirus NOT DETECTED NOT DETECTED Final   Influenza A NOT DETECTED NOT DETECTED Final   Influenza B NOT DETECTED NOT DETECTED Final   Parainfluenza Virus 1 NOT DETECTED NOT DETECTED Final   Parainfluenza Virus 2 NOT DETECTED NOT DETECTED Final   Parainfluenza Virus 3 NOT DETECTED NOT DETECTED Final   Parainfluenza Virus 4 NOT DETECTED NOT DETECTED Final   Respiratory Syncytial Virus NOT DETECTED NOT DETECTED Final   Bordetella pertussis NOT DETECTED NOT DETECTED Final   Bordetella Parapertussis NOT DETECTED NOT DETECTED Final   Chlamydophila pneumoniae NOT DETECTED NOT DETECTED Final   Mycoplasma pneumoniae NOT DETECTED NOT DETECTED Final    Comment: Performed at [facility], [address] [place], [place] [address]  Culture, blood (Routine X 2) w Reflex to ID Panel     Status: None (Preliminary result)   Collection Time: 06/16/22  9:18 PM   Specimen: BLOOD RIGHT ARM  Result Value Ref Range Status   Specimen Description BLOOD RIGHT ARM  Final   Special Requests BOTTLES DRAWN AEROBIC AND ANAEROBIC  Final   Culture   Final    NO GROWTH < 12 HOURS Performed at [facility], [address], [place], [place] [address]    Report Status PENDING  Incomplete  Culture, blood (Routine X 2) w Reflex to ID Panel     Status: None (Preliminary result)   Collection Time: 06/16/22  9:21 PM   Specimen: BLOOD RIGHT HAND  Result Value Ref Range Status   Specimen Description   Final    BLOOD RIGHT HAND Performed at [facility], [address] [place], [place] [address]    Special Requests   Final    BOTTLES DRAWN AEROBIC AND ANAEROBIC Blood Culture adequate volume Performed at [facility], [address], [place], [place] [address]    Culture   Final     NO GROWTH < 12 HOURS Performed at [facility], [address] [place], [place] [address]    Report Status PENDING  Incomplete     Radiology Studies: CT Angio Chest Pulmonary Embolism (PE) W or WO Contrast  Result Date: 06/17/2022 CLINICAL DATA:  Left-sided flank pain and fevers, initial encounter EXAM: CT ANGIOGRAPHY CHEST WITH CONTRAST TECHNIQUE: Multidetector CT imaging of the chest was performed using the standard protocol during bolus administration of intravenous contrast. Multiplanar CT image reconstructions and MIPs were obtained to evaluate the vascular anatomy. RADIATION DOSE REDUCTION: This exam was performed according to the departmental dose-optimization program which includes automated exposure control, adjustment of the mA and/or kV according to patient size and/or use of iterative reconstruction technique. CONTRAST:  75mL OMNIPAQUE IOHEXOL 350 MG/ML SOLN COMPARISON:  Chest x-ray from the previous day. FINDINGS: Cardiovascular: Thoracic aorta and its branches are within normal limits. No cardiac enlargement is seen. No coronary calcifications are noted. Pulmonary artery shows a normal branching pattern bilaterally. No intraluminal filling defect to suggest pulmonary embolism is seen. Mediastinum/Nodes: Thoracic inlet is within normal limits. No hilar or mediastinal adenopathy is noted. The esophagus as visualized is within normal limits. Lungs/Pleura: Lungs are well aerated bilaterally. No focal infiltrate or sizable effusion is seen. Upper Abdomen: Gallbladder has been surgically removed. The remainder of the upper abdomen is unremarkable. Musculoskeletal: No chest wall abnormality. No acute or significant osseous findings. Stable T11 hemivertebra Review of the MIP images confirms the above findings. IMPRESSION: No evidence of pulmonary emboli. No acute abnormality noted. Electronically Signed   By: Alcide Clever M.D.   On: 06/17/2022 00:36   DG CHEST PORT 1 VIEW  Result Date:  06/16/2022 CLINICAL DATA:  Fever. EXAM: PORTABLE CHEST 1 VIEW COMPARISON:  06/16/2021. FINDINGS: Low lung volumes accentuate the pulmonary vasculature and cardiomediastinal silhouette. No consolidation or pulmonary edema. No pleural effusion or pneumothorax. IMPRESSION: Low lung volumes without acute cardiopulmonary disease. Electronically Signed   By: Orvan Falconer M.D.   On: 06/16/2022 20:42   CT Renal Stone Study  Result Date: 06/16/2022 CLINICAL DATA:  Left flank pain and dizziness associated with intermittent difficulty urinating EXAM: CT ABDOMEN AND PELVIS WITHOUT CONTRAST TECHNIQUE: Multidetector CT imaging of the abdomen and pelvis was performed following the standard protocol without IV contrast. RADIATION DOSE REDUCTION: This exam was performed according to the departmental dose-optimization program which includes automated exposure control, adjustment of the mA and/or kV according to patient size and/or use of iterative reconstruction technique. COMPARISON:  CT abdomen and pelvis dated 08/01/2018 FINDINGS: Lower chest: No focal consolidation or pulmonary nodule in the lung bases. No pleural effusion or pneumothorax demonstrated. Partially imaged heart size is normal. Hepatobiliary: No focal hepatic lesions. No intra or extrahepatic biliary ductal dilation. Cholelithiasis. Pancreas: No focal lesions or main ductal dilation. Spleen: Normal in size without focal  abnormality. Adrenals/Urinary Tract: No adrenal nodules. No suspicious renal mass, calculi or hydronephrosis. No focal bladder wall thickening. Stomach/Bowel: Normal appearance of the stomach. No evidence of bowel wall thickening, distention, or inflammatory changes. Normal appendix. Vascular/Lymphatic: No significant vascular findings are present. Bilateral external iliac lymphadenopathy measuring up to 14 mm (2:80, 82), previously 7 mm on the right and 12 mm on the left. Reproductive: No adnexal masses. Other: No free fluid, fluid collection,  or free air. Musculoskeletal: No acute or abnormal lytic or blastic osseous lesions. Subcutaneous emphysema and calcified granulomata in the bilateral gluteal regions may be injection related. Partially imaged T11 vertebral segmentation anomaly. IMPRESSION: 1. Slight interval increase in size of bilateral external iliac lymphadenopathy measuring up to 14 mm, previously 7 mm on the right and 12 mm on the left. While findings are nonspecific and may be reactive, recommend clinical and laboratory evaluation for lymphoproliferative process. 2. Otherwise, no acute infectious/inflammatory findings in the abdomen or pelvis. Electronically Signed   By: Agustin Cree M.D.   On: 06/16/2022 16:10     Pamella Pert, MD, PhD [facility]  Between 7 am - 7 pm I am available, please contact me via Amion (for emergencies) or Securechat (non urgent messages)  Between 7 pm - 7 am I am not available, please contact night coverage MD/APP via Amion

## 2022-06-17 NOTE — Progress Notes (Signed)
Hypoglycemic Event  CBG: 67  Treatment: 8 oz juice/soda  Symptoms: None  Follow-up CBG: Time: 0445 CBG Result: 97  Possible Reasons for Event: Unknown  Comments/MD notified:Daniels NP    Kerrin Champagne

## 2022-06-18 DIAGNOSIS — N390 Urinary tract infection, site not specified: Secondary | ICD-10-CM | POA: Diagnosis not present

## 2022-06-18 LAB — URINE CULTURE

## 2022-06-18 LAB — GLUCOSE, CAPILLARY
Glucose-Capillary: 107 mg/dL — ABNORMAL HIGH (ref 70–99)
Glucose-Capillary: 74 mg/dL (ref 70–99)

## 2022-06-18 LAB — C4 COMPLEMENT: Complement C4, Body Fluid: 6 mg/dL — ABNORMAL LOW (ref 12–38)

## 2022-06-18 LAB — C3 COMPLEMENT: C3 Complement: 72 mg/dL — ABNORMAL LOW (ref 82–167)

## 2022-06-18 LAB — CULTURE, BLOOD (ROUTINE X 2): Culture: NO GROWTH

## 2022-06-18 MED ORDER — HYDROXYCHLOROQUINE SULFATE 200 MG PO TABS
200.0000 mg | ORAL_TABLET | Freq: Every day | ORAL | Status: DC
  Filled 2022-06-18: qty 1

## 2022-06-18 NOTE — Discharge Summary (Signed)
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 Hospitalist called for admission.   Discharge Diagnoses:  Principal Problem:   Complicated UTI (urinary tract infection)  SIRS -no source identified, patient did not meet sepsis criteria, urine culture was dirty, UA unremarkable and patient's symptoms resolved rapidly   Lupus -has a history of pericarditis about a year ago.  Continues to be well-controlled on Plaquenil and steroids.  Patient CRP and ESR are normal, however C3 and C4 are low.  Unclear if patient's complement levels are chronically low given her chart review or if they are only checks during lupus flare.  Follow-up with rheumatology on Monday, no further inpatient treatment or evaluation necessary.   Hypoglycemia -secondary to poor p.o. intake, A1c 5.4, patient is not diabetic   History of MS-outpatient follow up with rheumatology on Monday   Obesity, class I-based on a BMI of 32.  Discharge Instructions   Allergies as of 06/18/2022   No Known Allergies      Medication List     STOP taking these medications    amLODipine 5 MG tablet Commonly known as: NORVASC   colchicine 0.6 MG tablet   metoprolol succinate 100 MG 24 hr tablet Commonly known as: TOPROL-XL   Saphnelo 300 MG/2ML Soln  injection Generic drug: anifrolumab-fnia       TAKE these medications    azathioprine 100 MG tablet Commonly known as: IMURAN Take 150 mg by mouth daily.   clobetasol ointment 0.05 % Commonly known as: TEMOVATE Apply 1 application  topically 2 (two) times daily as needed (as directed).   Excedrin Extra Strength 250-250-65 MG tablet Generic drug: aspirin-acetaminophen-caffeine Take 1 tablet by mouth every 6 (six) hours as needed for headache or migraine.   hydroxychloroquine 200 MG tablet Commonly known as: PLAQUENIL Take 200 mg by mouth daily.   ibuprofen 200 MG tablet Commonly known as: ADVIL Take 200-400 mg by mouth every 6 (six) hours as needed for mild pain (or headaches).   ketoconazole 2 % shampoo Commonly known as: NIZORAL Apply 1 Application topically 2 (two) times a week.   predniSONE 10 MG tablet Commonly known as: DELTASONE Take 10-40 mg by mouth See admin instructions. Take 40 mg WITH FOOD by mouth once daily for 7 days then 30 mg once daily for 7 days then 20 mg once daily for 7 days then 10 mg once a day thereafter OR AS OTHERWISE INSTRUCTED   Tylenol 325 MG Caps Generic drug: Acetaminophen Take 325-650 mg by mouth every 6 (six) hours as needed (for headaches or pain).   Vitamin D (Ergocalciferol) 1.25 MG (50000 UNIT) Caps capsule Commonly known as: DRISDOL Take 1 capsule (50,000 Units total) by mouth every 7 (seven) days. What changed: when to take this        No Known Allergies  Consultations: None  Procedures/Studies: CT Angio Chest Pulmonary Embolism (PE) W or WO Contrast  Result Date:  06/17/2022 CLINICAL DATA:  Left-sided flank pain and fevers, initial encounter EXAM: CT ANGIOGRAPHY CHEST WITH CONTRAST TECHNIQUE: Multidetector CT imaging of the chest was performed using the standard protocol during bolus administration of intravenous contrast. Multiplanar CT image reconstructions and MIPs were obtained to evaluate the vascular anatomy.  RADIATION DOSE REDUCTION: This exam was performed according to the departmental dose-optimization program which includes automated exposure control, adjustment of the mA and/or kV according to patient size and/or use of iterative reconstruction technique. CONTRAST:  75mL OMNIPAQUE IOHEXOL 350 MG/ML SOLN COMPARISON:  Chest x-ray from the previous day. FINDINGS: Cardiovascular: Thoracic aorta and its branches are within normal limits. No cardiac enlargement is seen. No coronary calcifications are noted. Pulmonary artery shows a normal branching pattern bilaterally. No intraluminal filling defect to suggest pulmonary embolism is seen. Mediastinum/Nodes: Thoracic inlet is within normal limits. No hilar or mediastinal adenopathy is noted. The esophagus as visualized is within normal limits. Lungs/Pleura: Lungs are well aerated bilaterally. No focal infiltrate or sizable effusion is seen. Upper Abdomen: Gallbladder has been surgically removed. The remainder of the upper abdomen is unremarkable. Musculoskeletal: No chest wall abnormality. No acute or significant osseous findings. Stable T11 hemivertebra Review of the MIP images confirms the above findings. IMPRESSION: No evidence of pulmonary emboli. No acute abnormality noted. Electronically Signed   By: Alcide Clever M.D.   On: 06/17/2022 00:36   DG CHEST PORT 1 VIEW  Result Date: 06/16/2022 CLINICAL DATA:  Fever. EXAM: PORTABLE CHEST 1 VIEW COMPARISON:  06/16/2021. FINDINGS: Low lung volumes accentuate the pulmonary vasculature and cardiomediastinal silhouette. No consolidation or pulmonary edema. No pleural effusion or pneumothorax. IMPRESSION: Low lung volumes without acute cardiopulmonary disease. Electronically Signed   By: Orvan Falconer M.D.   On: 06/16/2022 20:42   CT Renal Stone Study  Result Date: 06/16/2022 CLINICAL DATA:  Left flank pain and dizziness associated with intermittent difficulty urinating EXAM: CT ABDOMEN AND PELVIS WITHOUT CONTRAST  TECHNIQUE: Multidetector CT imaging of the abdomen and pelvis was performed following the standard protocol without IV contrast. RADIATION DOSE REDUCTION: This exam was performed according to the departmental dose-optimization program which includes automated exposure control, adjustment of the mA and/or kV according to patient size and/or use of iterative reconstruction technique. COMPARISON:  CT abdomen and pelvis dated 08/01/2018 FINDINGS: Lower chest: No focal consolidation or pulmonary nodule in the lung bases. No pleural effusion or pneumothorax demonstrated. Partially imaged heart size is normal. Hepatobiliary: No focal hepatic lesions. No intra or extrahepatic biliary ductal dilation. Cholelithiasis. Pancreas: No focal lesions or main ductal dilation. Spleen: Normal in size without focal abnormality. Adrenals/Urinary Tract: No adrenal nodules. No suspicious renal mass, calculi or hydronephrosis. No focal bladder wall thickening. Stomach/Bowel: Normal appearance of the stomach. No evidence of bowel wall thickening, distention, or inflammatory changes. Normal appendix. Vascular/Lymphatic: No significant vascular findings are present. Bilateral external iliac lymphadenopathy measuring up to 14 mm (2:80, 82), previously 7 mm on the right and 12 mm on the left. Reproductive: No adnexal masses. Other: No free fluid, fluid collection, or free air. Musculoskeletal: No acute or abnormal lytic or blastic osseous lesions. Subcutaneous emphysema and calcified granulomata in the bilateral gluteal regions may be injection related. Partially imaged T11 vertebral segmentation anomaly. IMPRESSION: 1. Slight interval increase in size of bilateral external iliac lymphadenopathy measuring up to 14 mm, previously 7 mm on the right and 12 mm on the left. While findings are nonspecific and may be reactive, recommend clinical and laboratory evaluation for lymphoproliferative process.  2. Otherwise, no acute infectious/inflammatory  findings in the abdomen or pelvis. Electronically Signed   By: Agustin Cree M.D.   On: 06/16/2022 16:10     Subjective: No acute issues or events overnight, symptoms have resolved otherwise requesting discharge home which is certainly reasonable.  Mother at bedside concurs.   Discharge Exam: Vitals:   06/17/22 1928 06/18/22 0504  BP: (!) 153/94 (!) 146/104  Pulse: 93 80  Resp: 16 16  Temp: 98.6 F (37 C) 97.7 F (36.5 C)  SpO2: 100% 100%   Vitals:   06/17/22 0835 06/17/22 1206 06/17/22 1928 06/18/22 0504  BP: (!) 137/93 (!) 134/91 (!) 153/94 (!) 146/104  Pulse: 92 98 93 80  Resp: 18 17 16 16   Temp: 99.5 F (37.5 C) 99.4 F (37.4 C) 98.6 F (37 C) 97.7 F (36.5 C)  TempSrc: Oral Oral Oral Oral  SpO2: 100% 100% 100% 100%  Weight:      Height:        General: Pt is alert, awake, not in acute distress Cardiovascular: RRR, S1/S2 +, no rubs, no gallops Respiratory: CTA bilaterally, no wheezing, no rhonchi Abdominal: Soft, NT, ND, bowel sounds + Extremities: no edema, no cyanosis    The results of significant diagnostics from this hospitalization (including imaging, microbiology, ancillary and laboratory) are listed below for reference.     Microbiology: Recent Results (from the past 240 hour(s))  Respiratory (~20 pathogens) panel by PCR     Status: None   Collection Time: 06/16/22  8:00 PM   Specimen: Nasopharyngeal Swab; Respiratory  Result Value Ref Range Status   Adenovirus NOT DETECTED NOT DETECTED Final   Coronavirus 229E NOT DETECTED NOT DETECTED Final    Comment: (NOTE) The Coronavirus on the Respiratory Panel, DOES NOT test for the novel  Coronavirus (2019 nCoV)    Coronavirus HKU1 NOT DETECTED NOT DETECTED Final   Coronavirus NL63 NOT DETECTED NOT DETECTED Final   Coronavirus OC43 NOT DETECTED NOT DETECTED Final   Metapneumovirus NOT DETECTED NOT DETECTED Final   Rhinovirus / Enterovirus NOT DETECTED NOT DETECTED Final   Influenza A NOT DETECTED NOT  DETECTED Final   Influenza B NOT DETECTED NOT DETECTED Final   Parainfluenza Virus 1 NOT DETECTED NOT DETECTED Final   Parainfluenza Virus 2 NOT DETECTED NOT DETECTED Final   Parainfluenza Virus 3 NOT DETECTED NOT DETECTED Final   Parainfluenza Virus 4 NOT DETECTED NOT DETECTED Final   Respiratory Syncytial Virus NOT DETECTED NOT DETECTED Final   Bordetella pertussis NOT DETECTED NOT DETECTED Final   Bordetella Parapertussis NOT DETECTED NOT DETECTED Final   Chlamydophila pneumoniae NOT DETECTED NOT DETECTED Final   Mycoplasma pneumoniae NOT DETECTED NOT DETECTED Final    Comment: Performed at [facility], [address] [place], [place] [address]  Urine Culture (for pregnant, neutropenic or urologic patients or patients with an indwelling urinary catheter)     Status: Abnormal   Collection Time: 06/16/22  8:00 PM   Specimen: Urine, Clean Catch  Result Value Ref Range Status   Specimen Description   Final    URINE, CLEAN CATCH Performed at [facility], [address], [place], [place] [address]    Special Requests   Final    NONE Performed at [facility], [address], [place], [place] [address]    Culture MULTIPLE SPECIES PRESENT, SUGGEST RECOLLECTION (A)  Final   Report Status 06/18/2022 FINAL  Final  Culture, blood (Routine X 2)  w Reflex to ID Panel     Status: None (Preliminary result)   Collection Time: 06/16/22  9:18 PM   Specimen: BLOOD RIGHT ARM  Result Value Ref Range Status   Specimen Description BLOOD RIGHT ARM  Final   Special Requests BOTTLES DRAWN AEROBIC AND ANAEROBIC  Final   Culture   Final    NO GROWTH 2 DAYS Performed at [facility], [address], [place], [place] [address]    Report Status PENDING  Incomplete  Culture, blood (Routine X 2) w Reflex to ID Panel     Status: None (Preliminary result)   Collection Time: 06/16/22  9:21 PM   Specimen: BLOOD RIGHT HAND  Result Value Ref Range Status    Specimen Description   Final    BLOOD RIGHT HAND Performed at [facility], [address] [place], [place] [address]    Special Requests   Final    BOTTLES DRAWN AEROBIC AND ANAEROBIC Blood Culture adequate volume Performed at [facility], [address], [place], [place] [address]    Culture   Final    NO GROWTH 2 DAYS Performed at [facility], [address] [place], [place] [address]    Report Status PENDING  Incomplete     Labs: BNP (last 3 results) No results for input(s): "BNP" in the last 8760 hours. Basic Metabolic Panel: Recent Labs  Lab 06/11/22 1135 06/16/22 1433 06/16/22 1835 06/17/22 0142  NA 140 139  --  139  K 3.4* 3.4*  --  3.6  CL 107 107  --  109  CO2 24 26  --  25  GLUCOSE 92 72  --  91  BUN 9 9  --  12  CREATININE 0.76 0.79 0.74 0.65  CALCIUM 8.6* 8.6*  --  8.6*   Liver Function Tests: Recent Labs  Lab 06/17/22 0142  AST 15  ALT 10  ALKPHOS 50  BILITOT 0.3  PROT 5.9*  ALBUMIN 2.7*   No results for input(s): "LIPASE", "AMYLASE" in the last 168 hours. No results for input(s): "AMMONIA" in the last 168 hours. CBC: Recent Labs  Lab 06/11/22 1135 06/16/22 1433 06/16/22 1835 06/17/22 0142  WBC 3.4* 3.5* 3.7* 3.5*  HGB 12.1 11.4* 11.6* 10.3*  HCT 37.4 35.3* 36.8 31.8*  MCV 91.7 91.9 92.9 91.9  PLT 299 316 313 283   Cardiac Enzymes: No results for input(s): "CKTOTAL", "CKMB", "CKMBINDEX", "TROPONINI" in the last 168 hours. BNP: Invalid input(s): "POCBNP" CBG: Recent Labs  Lab 06/17/22 1635 06/17/22 1927 06/17/22 2337 06/18/22 0336 06/18/22 0752  GLUCAP 111* 111* 116* 107* 74   D-Dimer No results for input(s): "DDIMER" in the last 72 hours. Hgb A1c Recent Labs    06/16/22 1835 06/16/22 2118  HGBA1C 5.4 5.4   Lipid Profile No results for input(s): "CHOL", "HDL", "LDLCALC", "TRIG", "CHOLHDL", "LDLDIRECT" in the last 72 hours. Thyroid function studies Recent Labs    06/16/22 1835  TSH  0.436   Anemia work up No results for input(s): "VITAMINB12", "FOLATE", "FERRITIN", "TIBC", "IRON", "RETICCTPCT" in the last 72 hours. Urinalysis    Component Value Date/Time   COLORURINE YELLOW 06/16/2022 2000   APPEARANCEUR CLEAR 06/16/2022 2000   LABSPEC 1.008 06/16/2022 2000   PHURINE 6.0 06/16/2022 2000   GLUCOSEU NEGATIVE 06/16/2022 2000   HGBUR NEGATIVE 06/16/2022 2000   BILIRUBINUR NEGATIVE 06/16/2022 2000   BILIRUBINUR Neg 05/08/2010 1553   KETONESUR NEGATIVE 06/16/2022 2000   PROTEINUR  NEGATIVE 06/16/2022 2000   UROBILINOGEN 1.0 11/11/2013 0150   NITRITE NEGATIVE 06/16/2022 2000   LEUKOCYTESUR NEGATIVE 06/16/2022 2000   Sepsis Labs Recent Labs  Lab 06/11/22 1135 06/16/22 1433 06/16/22 1835 06/17/22 0142  WBC 3.4* 3.5* 3.7* 3.5*   Microbiology Recent Results (from the past 240 hour(s))  Respiratory (~20 pathogens) panel by PCR     Status: None   Collection Time: 06/16/22  8:00 PM   Specimen: Nasopharyngeal Swab; Respiratory  Result Value Ref Range Status   Adenovirus NOT DETECTED NOT DETECTED Final   Coronavirus 229E NOT DETECTED NOT DETECTED Final    Comment: (NOTE) The Coronavirus on the Respiratory Panel, DOES NOT test for the novel  Coronavirus (2019 nCoV)    Coronavirus HKU1 NOT DETECTED NOT DETECTED Final   Coronavirus NL63 NOT DETECTED NOT DETECTED Final   Coronavirus OC43 NOT DETECTED NOT DETECTED Final   Metapneumovirus NOT DETECTED NOT DETECTED Final   Rhinovirus / Enterovirus NOT DETECTED NOT DETECTED Final   Influenza A NOT DETECTED NOT DETECTED Final   Influenza B NOT DETECTED NOT DETECTED Final   Parainfluenza Virus 1 NOT DETECTED NOT DETECTED Final   Parainfluenza Virus 2 NOT DETECTED NOT DETECTED Final   Parainfluenza Virus 3 NOT DETECTED NOT DETECTED Final   Parainfluenza Virus 4 NOT DETECTED NOT DETECTED Final   Respiratory Syncytial Virus NOT DETECTED NOT DETECTED Final   Bordetella pertussis NOT DETECTED NOT DETECTED Final    Bordetella Parapertussis NOT DETECTED NOT DETECTED Final   Chlamydophila pneumoniae NOT DETECTED NOT DETECTED Final   Mycoplasma pneumoniae NOT DETECTED NOT DETECTED Final    Comment: Performed at [facility], [address], [place], [place] [address]  Urine Culture (for pregnant, neutropenic or urologic patients or patients with an indwelling urinary catheter)     Status: Abnormal   Collection Time: 06/16/22  8:00 PM   Specimen: Urine, Clean Catch  Result Value Ref Range Status   Specimen Description   Final    URINE, CLEAN CATCH Performed at [facility], [address], [place], [place] [address]    Special Requests   Final    NONE Performed at [facility], [address], [place], [place] [address]    Culture MULTIPLE SPECIES PRESENT, SUGGEST RECOLLECTION (A)  Final   Report Status 06/18/2022 FINAL  Final  Culture, blood (Routine X 2) w Reflex to ID Panel     Status: None (Preliminary result)   Collection Time: 06/16/22  9:18 PM   Specimen: BLOOD RIGHT ARM  Result Value Ref Range Status   Specimen Description BLOOD RIGHT ARM  Final   Special Requests BOTTLES DRAWN AEROBIC AND ANAEROBIC  Final   Culture   Final    NO GROWTH 2 DAYS Performed at [facility], [address] [place], [place] [address]    Report Status PENDING  Incomplete  Culture, blood (Routine X 2) w Reflex to ID Panel     Status: None (Preliminary result)   Collection Time: 06/16/22  9:21 PM   Specimen: BLOOD RIGHT HAND  Result Value Ref Range Status   Specimen Description   Final    BLOOD RIGHT HAND Performed at [facility], [address], [place], [place] [address]    Special Requests   Final    BOTTLES DRAWN AEROBIC AND ANAEROBIC Blood Culture adequate volume Performed at [facility], [address], [place], [place] [address]    Culture  Final    NO GROWTH 2 DAYS Performed at [facility], [address]  [place], [place] [address]    Report Status PENDING  Incomplete     Time coordinating discharge: Over 30 minutes  SIGNED:   Azucena Fallen, DO [facility] 06/18/2022, 10:11 AM Pager   If 7PM-7AM, please contact night-coverage [email or web address]

## 2022-06-19 ENCOUNTER — Telehealth: Payer: Self-pay

## 2022-06-19 LAB — CULTURE, BLOOD (ROUTINE X 2): Culture: NO GROWTH

## 2022-06-19 NOTE — Transitions of Care (Post Inpatient/ED Visit) (Signed)
   06/19/2022  Name: SHANTAE NOON MRN: [id number] DOB: 1985-05-06  Today's TOC FU Call Status: Today's TOC FU Call Status:: Unsuccessul Call (1st Attempt) Unsuccessful Call (1st Attempt) Date: 06/19/22  Attempted to reach the patient regarding the most recent Inpatient/ED visit.  Follow Up Plan: Additional outreach attempts will be made to reach the patient to complete the Transitions of Care (Post Inpatient/ED visit) call.   Signature Robyne Peers, RN

## 2022-06-21 LAB — CULTURE, BLOOD (ROUTINE X 2)

## 2022-06-23 ENCOUNTER — Telehealth: Payer: Self-pay

## 2022-06-23 NOTE — Transitions of Care (Post Inpatient/ED Visit) (Signed)
   06/23/2022  Name: Emily Hicks MRN: [id number] DOB: 1985-03-15  Today's TOC FU Call Status: Today's TOC FU Call Status:: Unsuccessful Call (2nd Attempt) Unsuccessful Call (1st Attempt) Date: 06/19/22 Unsuccessful Call (2nd Attempt) Date: 06/23/22  Attempted to reach the patient regarding the most recent Inpatient/ED visit.  Follow Up Plan: Additional outreach attempts will be made to reach the patient to complete the Transitions of Care (Post Inpatient/ED visit) call.   Signature   Robyne Peers, RN

## 2022-06-24 ENCOUNTER — Telehealth: Payer: Self-pay

## 2022-06-24 NOTE — Transitions of Care (Post Inpatient/ED Visit) (Signed)
   06/24/2022  Name: Emily Hicks MRN: [id number] DOB: September 27, 1985  Today's TOC FU Call Status: Today's TOC FU Call Status:: Unsuccessful Call (3rd Attempt) Unsuccessful Call (1st Attempt) Date: 06/19/22 Unsuccessful Call (2nd Attempt) Date: 06/23/22 Unsuccessful Call (3rd Attempt) Date: 06/24/22  Attempted to reach the patient regarding the most recent Inpatient/ED visit.  Follow Up Plan: No further outreach attempts will be made at this time. We have been unable to contact the patient.  Letter sent to patient via MyChart requesting she contact [facility] to schedule a follow up appointment as we have not been able to reach her   Signature  Robyne Peers, RN

## 2022-07-28 ENCOUNTER — Ambulatory Visit: Payer: 59 | Admitting: Neurology

## 2022-07-28 ENCOUNTER — Encounter: Payer: Self-pay | Admitting: Neurology

## 2022-07-28 VITALS — BP 123/89 | HR 121 | Ht 61.0 in | Wt 183.0 lb

## 2022-07-28 DIAGNOSIS — G35 Multiple sclerosis: Secondary | ICD-10-CM

## 2022-07-28 DIAGNOSIS — G43009 Migraine without aura, not intractable, without status migrainosus: Secondary | ICD-10-CM | POA: Diagnosis not present

## 2022-07-28 DIAGNOSIS — M329 Systemic lupus erythematosus, unspecified: Secondary | ICD-10-CM

## 2022-07-28 DIAGNOSIS — R29898 Other symptoms and signs involving the musculoskeletal system: Secondary | ICD-10-CM

## 2022-07-28 DIAGNOSIS — R269 Unspecified abnormalities of gait and mobility: Secondary | ICD-10-CM

## 2022-07-28 DIAGNOSIS — N319 Neuromuscular dysfunction of bladder, unspecified: Secondary | ICD-10-CM

## 2022-07-28 MED ORDER — TAMSULOSIN HCL 0.4 MG PO CAPS: 0.4000 mg | ORAL_CAPSULE | Freq: Every day | ORAL | 11 refills | Status: DC

## 2022-07-28 NOTE — Progress Notes (Signed)
[facility]  PATIENT: Emily Hicks DOB: 06-Sep-1985  REFERRING DOCTOR OR PCP:  Dr. Constance Goltz (PCP) SOURCE: patient, lab reports, MRI reports, multiple MRI images personally reviewed.  _________________________________   HISTORICAL  CHIEF COMPLAINT:  Chief Complaint  Patient presents with   Room 11    Pt is here Alone. Pt states that things have been going well since last appointment. Pt states that she had a 2 Lupus flare ups recently. Pt states since her Lupus flare up in May of this year she is unable to walk without her walker. Pt states that she has numbness from her Sciatic down to her legs.    HISTORY OF PRESENT ILLNESS:  She is a 37 year old woman who was diagnosed with MS in early 2020 but has had symptoms since 2010.   She also has SLE.  Update 07/28/2022: I last saw her 2+ years ago.    She is not on any disease modifying therapy for her MS at this time but is on therapy for SLE.  She was placed on Azathioprine 150 mg daily and plaquenil 200 mg po bid.  Both the lupus and the MS appear to be stable.  Some studies have suggested that CellCept has some benefit in MS though the extent of the benefit is unclear..  She just started Saphnelo again, an IFN-1 receptor antagonist IV.    Her first infusion went well  She previously had been on it in 2023.    She had a flare up of numbness, bladder issues and leg strength.  Gait is worse.   Her bladder issues developed with this flare-up.   This is mostly urgency though she has considerable hesitancy, and double voids/incomplete empying.   Neulologically, she is stable.  Gait and balance are fine. She can go up/down stairs without using the bannister.   She has left arm tingling which is not troublesome..    Limb strength is fine.     Bladder function is fine.   Vision is fine.   She sees ophthalmology regularly     She has some fatigue that fluctuates.  She is often very tired after work.   She sleeps well most nights.   Mood is fine.  Cognition is fine.   She has some headaches once a month and they respond to OTC analgesics.   This is improved.    She sees Dr. Deanne Coffer [facility]) for her SLE.    MS Clinical course:   Around 2010, she had numbness/tingling in her left thigh and cramps in her toes.   While pregnant a little time later, she improved.   After her sone was born, the left leg leg symptoms returned.   She saw Dr. Terrace Arabia and an MRI of the brain showed a few scattered foci but was mostly nonspecific.   She had more intense numbness in the left leg but also numbness in the left face and a repeat 2016 brain MRI showed a few more lesions.    Numbness symptoms did not improve but early 2020 she had more intense numbness and an MRI of the brain and cervical spine was performed.   She had 3-4 lesions in her cervical spine and the brian showed no definite less lesions.   An LP ws then performed and showed > 5 oligoclonal bands.   MS was diagnosed.    She started Aubagio and took only one week before having a hospitalization for fever, hypernatremia, joint inflammation, thrush) and  it was stopped.    First seen by Dr. Epimenio Foot 09/08/2018.    MRI brain 05/16/2020 was unchanged compared to 2021  MRI cervical spine 05/16/2020 showed  no definite new lesions (one possible new lesion had been seen on a prior MRI). "There is a subtle T2 hyperintense focus posterolaterally to the right adjacent to C2. There is a focus toward the left adjacent to C4-C5 also noted on the previous MRIs. These were present, and actually more apparent, on the MRI from 05/24/2019. There is a subtle focus towards the right adjacent to C6-C7 seen more prominently on previous MRIs. There is a small focus towards the left C5-C6 seen on the sagittal images today but not noted on the4/20/2021 MRI. However, the focus does appear to be subtle on the 03/24/2018 MRI.. "    MRI brain 05/24/19 shows foci in the hemispheres and in the right cerebral  peduncle consistent with MS.  No changes compared to 07/27/2018.  Normal enhancement pattern. MRI cervical spine 05/24/19 shows T2 hyperintense foci to the right adjacent to C2, to the left adjacent to C4-C5, towards the left adjacent to C5 and towards the right adjacent to C6-C7.  No change compared to the 2020 MRI. MRI brain 07/27/18: Formal report was Normal - does show a few small T2/Flair lesions, 2 periventricular and the rest deep white matter MRI cervical spine 03/24/18 shows 3-4 lesions (C4 left, C5 left and C7 right also C2 on sagittal only) c/w demyelinating plaque. MRI brain 03/25/18:  Some scattered T2/Flair foci in hemispheres 1-2 perventricular MRI lumbar 08/02/18:  Normal  11/15/2014: By the report:   6 periventricular and subcortical foci - 1 new focus in left posterior temporal white matter and 2 punctate left frontal lobe lesions compared to 2011.   Other lesions stable.  I also see a right cerebral peduncle lesion that may explain her symptoms   MS and other Lab Studies:  CSF showed > 5 OCB and increased IgG index (1.57) 04/16/2018.   Positive ANA with increased dsDNA, ENARNP, ENA SM, SSA.   ESR = 27, Vit D low (15-17)  Other medical problems: She has SLE and is on Plaquenil (Dr. Deanne Coffer [phone])  She was hospitalized for SLE, hypernatremia, elevated LFTs and thrush 07/27/18,   Of note LFTs were normal 09/08/2018.   She did take Aubagio x 1 week before she was hospitalized.     REVIEW OF SYSTEMS: Constitutional: No fevers, chills, sweats, or change in appetite Eyes: No visual changes, double vision, eye pain Ear, nose and throat: No hearing loss, ear pain, nasal congestion, sore throat Cardiovascular: No chest pain, palpitations Respiratory:  No shortness of breath at rest or with exertion.   No wheezes GastrointestinaI: No nausea, vomiting, diarrhea, abdominal pain, fecal incontinence Genitourinary:  No dysuria, urinary retention or frequency.  No nocturia. Musculoskeletal:  No  neck pain, back pain Integumentary: No rash, pruritus, skin lesions Neurological: as above Psychiatric: No depression at this time.  No anxiety Endocrine: No palpitations, diaphoresis, change in appetite, change in weigh or increased thirst Hematologic/Lymphatic:  No anemia, purpura, petechiae. Allergic/Immunologic: No itchy/runny eyes, nasal congestion, recent allergic reactions, rashes  ALLERGIES: No Known Allergies  HOME MEDICATIONS:  Current Outpatient Medications:    azathioprine (IMURAN) 100 MG tablet, Take 150 mg by mouth daily., Disp: , Rfl:    clobetasol ointment (TEMOVATE) 0.05 %, Apply 1 application  topically 2 (two) times daily as needed (as directed)., Disp: , Rfl:    EXCEDRIN EXTRA STRENGTH  250-250-65 MG tablet, Take 1 tablet by mouth every 6 (six) hours as needed for headache or migraine., Disp: , Rfl:    hydroxychloroquine (PLAQUENIL) 200 MG tablet, Take 200 mg by mouth daily., Disp: , Rfl:    ibuprofen (ADVIL) 200 MG tablet, Take 200-400 mg by mouth every 6 (six) hours as needed for mild pain (or headaches)., Disp: , Rfl:    ketoconazole (NIZORAL) 2 % shampoo, Apply 1 Application topically 2 (two) times a week., Disp: , Rfl:    predniSONE (DELTASONE) 10 MG tablet, Take 10-40 mg by mouth See admin instructions. Take 40 mg WITH FOOD by mouth once daily for 7 days then 30 mg once daily for 7 days then 20 mg once daily for 7 days then 10 mg once a day thereafter OR AS OTHERWISE INSTRUCTED, Disp: , Rfl:    TYLENOL 325 MG CAPS, Take 325-650 mg by mouth every 6 (six) hours as needed (for headaches or pain)., Disp: , Rfl:    Vitamin D, Ergocalciferol, (DRISDOL) 1.25 MG (50000 UNIT) CAPS capsule, Take 1 capsule (50,000 Units total) by mouth every 7 (seven) days. (Patient taking differently: Take 50,000 Units by mouth every Tuesday.), Disp: 12 capsule, Rfl: 1  Current Facility-Administered Medications:    medroxyPROGESTERone (DEPO-PROVERA) injection 150 mg, 150 mg, Intramuscular, Q90  days, McDiarmid, Leighton Roach, MD, 150 mg at 03/18/21 1435  PAST MEDICAL HISTORY: Past Medical History:  Diagnosis Date   Cervical intraepithelial neoplasia III    Gallstones    GERD (gastroesophageal reflux disease)    only prn OTC occasionally   History of blood transfusion 2020   Lupus (HCC) dx 2020   Multiple sclerosis (HCC) dx 2020   Numbness and tingling of left leg     PAST SURGICAL HISTORY: Past Surgical History:  Procedure Laterality Date   CERVICAL CONIZATION W/BX N/A 01/16/2020   Procedure: CERVICAL CONE BIOPSY, ENDOCERVICAL CURETTAGE;  Surgeon: Theresia Majors, MD;  Location: [facility] [place];  Service: Gynecology;  Laterality: N/A;  request 7:30am OR start in [place] Gyn block requests 45 minutes   CHOLECYSTECTOMY N/A 12/28/2013   Procedure: LAPAROSCOPIC CHOLECYSTECTOMY;  Surgeon: Claud Kelp, MD;  Location: [facility];  Service: General;  Laterality: N/A;    FAMILY HISTORY: Family History  Problem Relation Age of Onset   Hypertension Father    Hypertension Mother    Hypertension Sister    Diabetes Maternal Grandmother    Diabetes Maternal Grandfather     SOCIAL HISTORY:  Social History   Socioeconomic History   Marital status: Married    Spouse name: Not on file   Number of children: 1   Years of education: 16   Highest education level: Bachelor's degree (e.g., BA, AB, BS)  Occupational History   Occupation: Accounting  Tobacco Use   Smoking status: Never   Smokeless tobacco: Never  Vaping Use   Vaping Use: Never used  Substance and Sexual Activity   Alcohol use: No   Drug use: No   Sexual activity: Yes    Birth control/protection: Injection    Comment: 1st intercourse 37 yo-Fewer than 5 partners  Other Topics Concern   Not on file  Social History Narrative   Lives at home with husband and son.   Right-handed.   No caffeine use.   Social Determinants of Health   Financial Resource Strain: Not on file  Food Insecurity: No Food  Insecurity (06/16/2022)   Hunger Vital Sign    Worried About Running Out of Food  in the Last Year: Never true    Ran Out of Food in the Last Year: Never true  Transportation Needs: No Transportation Needs (06/16/2022)   PRAPARE - Administrator, Civil Service (Medical): No    Lack of Transportation (Non-Medical): No  Physical Activity: Not on file  Stress: Not on file  Social Connections: Not on file  Intimate Partner Violence: Not At Risk (06/16/2022)   Humiliation, Afraid, Rape, and Kick questionnaire    Fear of Current or Ex-Partner: No    Emotionally Abused: No    Physically Abused: No    Sexually Abused: No     PHYSICAL EXAM  Vitals:   07/28/22 1114  BP: 123/89  Pulse: (!) 121  Weight: 183 lb (83 kg)  Height: 5\' 1"  (1.549 m)    Body mass index is 34.58 kg/m.   General: The patient is well-developed and well-nourished and in no acute distress   Skin: Extremities are without rash or  edema.   Neurologic Exam  Mental status: The patient is alert and oriented x 3 at the time of the examination. The patient has apparent normal recent and remote memory, with an apparently normal attention span and concentration ability.   Speech is normal.  Cranial nerves: Extraocular movements are full.  Facial strength and sensation is normal..  Trapezius and sternocleidomastoid strength is normal. No dysarthria is noted.   No obvious hearing deficits are noted.  Motor:  Muscle bulk is normal.   Tone is normal. Strength is  4-/5 right leg, 4/5 left leg.  Sensory: Sensory testing shows reduced sensation to touch and vibration on right arm and leg.    Coordination: Cerebellar testing reveals good finger-nose-finger and now normal heel-to-shin bilaterally.  Gait and station: Station is normal.  She needs walker. Was able to walk independently at the last visit.   Cannot tandem.  Romberg is borderline.  Reflexes: Deep tendon reflexes are symmetric and normal bilaterally.          DIAGNOSTIC DATA (LABS, IMAGING, TESTING) - I reviewed patient records, labs, notes, testing and imaging myself where available.  Lab Results  Component Value Date   WBC 3.5 (L) 06/17/2022   HGB 10.3 (L) 06/17/2022   HCT 31.8 (L) 06/17/2022   MCV 91.9 06/17/2022   PLT 283 06/17/2022      Component Value Date/Time   NA 139 06/17/2022 0142   NA 143 09/29/2019 1035   K 3.6 06/17/2022 0142   CL 109 06/17/2022 0142   CO2 25 06/17/2022 0142   GLUCOSE 91 06/17/2022 0142   BUN 12 06/17/2022 0142   BUN 7 09/29/2019 1035   CREATININE 0.65 06/17/2022 0142   CREATININE 0.71 10/29/2010 0934   CALCIUM 8.6 (L) 06/17/2022 0142   PROT 5.9 (L) 06/17/2022 0142   PROT 6.7 09/29/2019 1035   ALBUMIN 2.7 (L) 06/17/2022 0142   ALBUMIN 4.2 09/29/2019 1035   ALBUMIN 4.0 04/16/2018 1225   AST 15 06/17/2022 0142   ALT 10 06/17/2022 0142   ALKPHOS 50 06/17/2022 0142   BILITOT 0.3 06/17/2022 0142   BILITOT 0.6 09/29/2019 1035   GFRNONAA >60 06/17/2022 0142   GFRAA 127 09/29/2019 1035   Lab Results  Component Value Date   CHOL 126 08/15/2021   HDL 49 08/15/2021   LDLCALC 59 08/15/2021   TRIG 93 08/15/2021   CHOLHDL 2.6 08/15/2021   Lab Results  Component Value Date   HGBA1C 5.4 06/16/2022   Lab Results  Component  Value Date   VITAMINB12 168 (L) 06/13/2021   Lab Results  Component Value Date   TSH 0.436 06/16/2022       ASSESSMENT AND PLAN  1. Multiple sclerosis (HCC)   2. SLE (systemic lupus erythematosus related syndrome) (HCC)   3. Migraine without aura and without status migrainosus, not intractable       1.   She likely had an MS exacerbation in May 2024 and now has gait and leg strength impairment. She will continue her SLE med's for now.   We need to check MRI brain and spine to determine if new lesions.  If so may need to begin a DMT specific for MS (will coordinate with Rheumatologist - Dr. Deanne Coffer at [facility] if changes)  2.   We discussed PT and she  will consider after studies 3.  Stay active and exercise as tolerated. 4.    Vit D supps - 5000 units daily 5.    Tamsulosin for bladder 6.   She will return to see me in 6 months but sooner based on a treatment decision.  40-minute office visit with the majority of the time spent face-to-face for history and physical, discussion/counseling and decision-making.  Additional time with record review and documentation.   Bell Carbo A. Epimenio Foot, MD, [facility] 07/28/2022, 11:39 AM Certified in Neurology, Clinical Neurophysiology, Sleep Medicine and Neuroimaging  [facility] [address] [place], [place] [address] [phone]

## 2022-07-29 ENCOUNTER — Other Ambulatory Visit: Payer: Self-pay | Admitting: Neurology

## 2022-07-29 ENCOUNTER — Telehealth: Payer: Self-pay | Admitting: Neurology

## 2022-07-29 MED ORDER — ALPRAZOLAM 0.5 MG PO TABS: ORAL_TABLET | ORAL | 0 refills | Status: DC

## 2022-07-29 NOTE — Telephone Encounter (Signed)
Pt scheduled for 135 mins MR brain/cervical/thoracic w/wo contrast at [facility] for 08/04/22 at 1pm. Pt is requesting medication to help with claustrophobia.  [facility] auth# [id number] (07/29/22-09/12/22)

## 2022-07-29 NOTE — Telephone Encounter (Signed)
Dr. Epimenio Foot- are you ok with calling in xanax for her? If so, I have it ready for you to e-scribe below. Thank you

## 2022-07-31 NOTE — Progress Notes (Signed)
Cardiology Office Note:  .   Date:  08/04/2022  ID:  Emily Hicks, DOB 05-Apr-1985, MRN [id number] PCP: Marcine Matar, MD  [facility] Providers Cardiologist:  Christell Constant, MD    History of Present Illness: .    KAELEY Hicks is a 37 y.o. female with history of HTN, MS, SLE.   2023: Had lupus pericarditis.  ESR and CRP elevated  Improved on colchicine and ibuprofen  Patient notes that she is doing better.   Since last visit notes  she she has had return of some of her prior chest pain from last year.  Had the tachycardia she had at that time as well There are no interval hospital/ED visit.    No SOB/DOE and no PND/Orthopnea.  No weight gain or leg swelling. Feels the tachycardia worse with activity. She had elevated heart rates   Relevant histories: .  Social: Met her with her mother  ROS: See HPI   Studies Reviewed: .   Cardiac Studies & Procedures       ECHOCARDIOGRAM  ECHOCARDIOGRAM COMPLETE 06/14/2021  Narrative ECHOCARDIOGRAM REPORT    Patient Name:   Emily Hicks Date of Exam: 06/14/2021 Medical Rec #:  [id number]     Height:       61.0 in Accession #:    [id number]    Weight:       185.0 lb Date of Birth:  15-Nov-1985     BSA:          1.827 m Patient Age:    35 years      BP:           146/97 mmHg Patient Gender: F             HR:           104 bpm. Exam Location:  Inpatient  Procedure: 2D Echo, Cardiac Doppler and Color Doppler  Indications:    Chest pain  History:        Patient has prior history of Echocardiogram examinations, most recent 08/02/2018. Risk Factors:Hypertension.  Sonographer:    Eduard Roux Referring Phys: [id number] ANASTASSIA DOUTOVA  IMPRESSIONS   1. Left ventricular ejection fraction, by estimation, is 60 to 65%. The left ventricle has normal function. The left ventricle has no regional wall motion abnormalities. Indeterminate diastolic filling due to E-A fusion. 2. Right ventricular systolic function is  normal. The right ventricular size is normal. There is normal pulmonary artery systolic pressure. 3. Left atrial size was mildly dilated. 4. Trivial mitral valve regurgitation. 5. The aortic valve is normal in structure. Aortic valve regurgitation is not visualized. 6. The inferior vena cava is normal in size with <50% respiratory variability, suggesting right atrial pressure of 8 mmHg.  Comparison(s): No significant change from prior study.  FINDINGS Left Ventricle: Left ventricular ejection fraction, by estimation, is 60 to 65%. The left ventricle has normal function. The left ventricle has no regional wall motion abnormalities. The left ventricular internal cavity size was normal in size. There is borderline left ventricular hypertrophy. Indeterminate diastolic filling due to E-A fusion.  Right Ventricle: The right ventricular size is normal. No increase in right ventricular wall thickness. Right ventricular systolic function is normal. There is normal pulmonary artery systolic pressure. The tricuspid regurgitant velocity is 2.33 m/s, and with an assumed right atrial pressure of 8 mmHg, the estimated right ventricular systolic pressure is 29.7 mmHg.  Left Atrium: Left atrial size was mildly dilated.  Right  Atrium: Right atrial size was normal in size.  Pericardium: There is no evidence of pericardial effusion.  Mitral Valve: Trivial mitral valve regurgitation.  Tricuspid Valve: The tricuspid valve is normal in structure. Tricuspid valve regurgitation is trivial.  Aortic Valve: The aortic valve is normal in structure. Aortic valve regurgitation is not visualized. Aortic valve peak gradient measures 13.2 mmHg.  Pulmonic Valve: The pulmonic valve was normal in structure. Pulmonic valve regurgitation is not visualized.  Aorta: The aortic root and ascending aorta are structurally normal, with no evidence of dilitation.  Venous: The inferior vena cava is normal in size with less than 50%  respiratory variability, suggesting right atrial pressure of 8 mmHg.  IAS/Shunts: No atrial level shunt detected by color flow Doppler.   LEFT VENTRICLE PLAX 2D LVIDd:         4.50 cm   Diastology LVIDs:         3.10 cm   LV e' medial:    6.83 cm/s LV PW:         1.00 cm   LV E/e' medial:  13.1 LV IVS:        1.10 cm   LV e' lateral:   8.93 cm/s LVOT diam:     2.00 cm   LV E/e' lateral: 10.0 LV SV:         74 LV SV Index:   40 LVOT Area:     3.14 cm   RIGHT VENTRICLE             IVC RV Basal diam:  3.00 cm     IVC diam: 2.00 cm RV S prime:     14.90 cm/s TAPSE (M-mode): 2.1 cm  LEFT ATRIUM             Index        RIGHT ATRIUM           Index LA diam:        4.40 cm 2.41 cm/m   RA Area:     12.40 cm LA Vol (A2C):   53.5 ml 29.28 ml/m  RA Volume:   26.50 ml  14.50 ml/m LA Vol (A4C):   64.1 ml 35.08 ml/m LA Biplane Vol: 63.3 ml 34.64 ml/m AORTIC VALVE                 PULMONIC VALVE AV Area (Vmax): 2.31 cm     PV Vmax:       1.04 m/s AV Vmax:        182.00 cm/s  PV Peak grad:  4.3 mmHg AV Peak Grad:   13.2 mmHg LVOT Vmax:      134.00 cm/s LVOT Vmean:     82.500 cm/s LVOT VTI:       0.234 m  AORTA Ao Root diam: 3.00 cm Ao Asc diam:  2.70 cm  MITRAL VALVE               TRICUSPID VALVE MV Area (PHT): 5.20 cm    TR Peak grad:   21.7 mmHg MV Decel Time: 146 msec    TR Vmax:        233.00 cm/s MV E velocity: 89.70 cm/s MV A velocity: 98.00 cm/s  SHUNTS MV E/A ratio:  0.92        Systemic VTI:  0.23 m Systemic Diam: 2.00 cm  Carolan Clines Electronically signed by Carolan Clines Signature Date/Time: 06/14/2021/12:15:51 PM    Final  Physical Exam:   VS:  BP 118/70   Pulse (!) 103   Ht 5\' 1"  (1.549 m)   Wt 191 lb (86.6 kg)   SpO2 99%   BMI 36.09 kg/m    Wt Readings from Last 3 Encounters:  08/04/22 191 lb (86.6 kg)  07/28/22 183 lb (83 kg)  06/16/22 170 lb (77.1 kg)    Gen: no distress, morbid obesity   Neck: No JVD Cardiac: Regular  tachycardia with friction rub Respiratory: Clear to auscultation bilaterally, normal effort, normal  respiratory rate GI: Soft, nontender, non-distended  MS: No edema;  moves all extremities Integument: Skin feels warm Neuro:  At time of evaluation, alert and oriented to person/place/time/situation  Psych: Normal affect, patient feels ok   ASSESSMENT AND PLAN: .     Recurrent Pericarditis in the setting of Lupus and Multiple Sclerosis - will get ESR and CRP - start low dose metoprolol for tachycardia - limited echo for effusions - will likely Start on Ibuprofen 800 mg TID will take for two-4 weeks - Started on colchicine 0.6 mg BID unless diarrhea - We may be starting rilionacept for recurrent pericarditis- will need to reach out to [facility] clinical specialist [phone]) and [facility] - dosing is two 160 mg [place] injections day one then one 160 mg injection once weekly, will need enrollment for and injection site teaching  Three months APP to see how she does off Colchicine Six months with me to decide rilonacept    Riley Lam, MD FASE [facility] Cardiologist [facility]  [address] [place], [address] [place], [place] [address] [phone]  7:43 PM

## 2022-08-04 ENCOUNTER — Encounter: Payer: Self-pay | Admitting: Internal Medicine

## 2022-08-04 ENCOUNTER — Ambulatory Visit: Payer: 59 | Attending: Internal Medicine | Admitting: Internal Medicine

## 2022-08-04 VITALS — BP 118/70 | HR 103 | Ht 61.0 in | Wt 191.0 lb

## 2022-08-04 DIAGNOSIS — M329 Systemic lupus erythematosus, unspecified: Secondary | ICD-10-CM

## 2022-08-04 DIAGNOSIS — I300 Acute nonspecific idiopathic pericarditis: Secondary | ICD-10-CM | POA: Diagnosis not present

## 2022-08-04 DIAGNOSIS — G35 Multiple sclerosis: Secondary | ICD-10-CM | POA: Diagnosis not present

## 2022-08-04 MED ORDER — METOPROLOL SUCCINATE ER 25 MG PO TB24: 25.0000 mg | ORAL_TABLET | Freq: Every day | ORAL | 3 refills | Status: DC

## 2022-08-04 NOTE — Patient Instructions (Signed)
Medication Instructions:  Your physician has recommended you make the following change in your medication:  START: metoprolol succinate (Toprol-XL) 25 mg by mouth once daily at bedtime  *If you need a refill on your cardiac medications before your next appointment, please call your pharmacy*   Lab Work: CRP, ESR  If you have labs (blood work) drawn today and your tests are completely normal, you will receive your results only by: MyChart Message (if you have MyChart) OR A paper copy in the mail If you have any lab test that is abnormal or we need to change your treatment, we will call you to review the results.   Testing/Procedures: Your physician has requested that you have an echocardiogram. Echocardiography is a painless test that uses sound waves to create images of your heart. It provides your doctor with information about the size and shape of your heart and how well your heart's chambers and valves are working. This procedure takes approximately one hour. There are no restrictions for this procedure. Please do NOT wear cologne, perfume, aftershave, or lotions (deodorant is allowed). Please arrive 15 minutes prior to your appointment time.    Follow-Up: At [facility], you and your health needs are our priority.  As part of our continuing mission to provide you with exceptional heart care, we have created designated Provider Care Teams.  These Care Teams include your primary Cardiologist (physician) and Advanced Practice Providers (APPs -  Physician Assistants and Nurse Practitioners) who all work together to provide you with the care you need, when you need it.   Your next appointment:   3 month(s)  Provider:   Jari Favre, PA-C, Robin Searing, NP, or Eligha Bridegroom, NP     Then, Christell Constant, MD will plan to see you again in 6 month(s).

## 2022-08-05 ENCOUNTER — Ambulatory Visit (INDEPENDENT_AMBULATORY_CARE_PROVIDER_SITE_OTHER): Payer: 59

## 2022-08-05 ENCOUNTER — Other Ambulatory Visit: Payer: 59

## 2022-08-05 DIAGNOSIS — G35D Multiple sclerosis, unspecified: Secondary | ICD-10-CM

## 2022-08-05 DIAGNOSIS — R29898 Other symptoms and signs involving the musculoskeletal system: Secondary | ICD-10-CM

## 2022-08-05 DIAGNOSIS — N319 Neuromuscular dysfunction of bladder, unspecified: Secondary | ICD-10-CM

## 2022-08-05 DIAGNOSIS — G35 Multiple sclerosis: Secondary | ICD-10-CM | POA: Diagnosis not present

## 2022-08-05 DIAGNOSIS — R269 Unspecified abnormalities of gait and mobility: Secondary | ICD-10-CM

## 2022-08-05 MED ORDER — GADOBENATE DIMEGLUMINE 529 MG/ML IV SOLN
18.0000 mL | Freq: Once | INTRAVENOUS | Status: AC | PRN
  Administered 2022-08-05: 18 mL via INTRAVENOUS

## 2022-08-06 ENCOUNTER — Other Ambulatory Visit (HOSPITAL_COMMUNITY)
Admission: RE | Admit: 2022-08-06 | Discharge: 2022-08-06 | Disposition: A | Discharge status: Home / Self Care | Payer: 59 | Source: Ambulatory Visit | Attending: Nurse Practitioner | Admitting: Nurse Practitioner

## 2022-08-06 ENCOUNTER — Encounter: Payer: Self-pay | Admitting: Nurse Practitioner

## 2022-08-06 ENCOUNTER — Ambulatory Visit (INDEPENDENT_AMBULATORY_CARE_PROVIDER_SITE_OTHER): Payer: 59 | Admitting: Nurse Practitioner

## 2022-08-06 VITALS — BP 124/78 | HR 76 | Ht 61.0 in | Wt 187.0 lb

## 2022-08-06 DIAGNOSIS — Z9889 Other specified postprocedural states: Secondary | ICD-10-CM

## 2022-08-06 DIAGNOSIS — Z124 Encounter for screening for malignant neoplasm of cervix: Secondary | ICD-10-CM | POA: Diagnosis present

## 2022-08-06 DIAGNOSIS — Z01419 Encounter for gynecological examination (general) (routine) without abnormal findings: Secondary | ICD-10-CM | POA: Diagnosis not present

## 2022-08-06 NOTE — Progress Notes (Signed)
   Emily Hicks 09-Feb-1985 [id number]   History:  37 y.o. G1P0001 presents for annual exam. Stopped Depo fall 2023. LMP in April 2024. Not sexually active due to health but will plan to restart contraception later. 01/16/2020 cold knife conization and endocervical curettage for severe cervical dysplasia/CIN 3/carcinoma in situ on biopsy. All margins negative for high-grade dysplasia, low-grade dysplasia present at ectocervical margin in all 4 quadrants, no evidence of invasive carcinoma. Pap 08/08/2020 normal cytology positive HR HPV, 12/2020 normal neg HPV. History of Lupus, MS. Hospitalized in May for MS flare. Doing better but still having issues with mobility.   Gynecologic History Patient's last menstrual period was 05/19/2022 (approximate).   Contraception/Family planning: coitus interruptus Sexually active: No  Health Maintenance Last Pap: 12/17/2020. Results were: Normal neg HPV, 1-year repeat Last mammogram: Not indicated Last colonoscopy: Not indicated Last Dexa: Not indicated   Past medical history, past surgical history, family history and social history were all reviewed and documented in the EPIC chart. Married. Works at [employer] 75 yo son.   ROS:  A ROS was performed and pertinent positives and negatives are included.  Exam:  Vitals:   08/06/22 1451  BP: 124/78  Pulse: 76  SpO2: 100%  Weight: 187 lb (84.8 kg)  Height: 5\' 1"  (1.549 m)    Body mass index is 35.33 kg/m.  General appearance:  Normal Thyroid:  Symmetrical, normal in size, without palpable masses or nodularity. Respiratory  Auscultation:  Clear without wheezing or rhonchi Cardiovascular  Auscultation:  Regular rate, without rubs, murmurs or gallops  Edema/varicosities:  Not grossly evident Abdominal  Soft,nontender, without masses, guarding or rebound.  Liver/spleen:  No organomegaly noted  Hernia:  None appreciated  Skin  Inspection:  Grossly normal Breasts: Examined lying and  sitting.   Right: Without masses, retractions, nipple discharge or axillary adenopathy.   Left: Without masses, retractions, nipple discharge or axillary adenopathy. Genitourinary   Inguinal/mons:  Normal without inguinal adenopathy  External genitalia:  Normal appearing vulva with no masses, tenderness, or lesions  BUS/Urethra/Skene's glands:  Normal  Vagina:  Normal appearing with normal color and discharge, no lesions  Cervix:  Normal appearing without discharge or lesions  Uterus:  Difficult to palpate but no gross masses or tenderness  Adnexa/parametria:     Rt: Normal in size, without masses or tenderness.   Lt: Normal in size, without masses or tenderness.  Anus and perineum: Normal  Patient informed chaperone available to be present for breast and pelvic exam. Patient has requested no chaperone to be present. Patient has been advised what will be completed during breast and pelvic exam.   Assessment/Plan:  37 y.o. G1P0001 for annual exam.   Well female exam with routine gynecological exam - Education provided on SBEs, importance of preventative screenings, current guidelines, high calcium diet, regular exercise, and multivitamin daily. Labs done elsewhere.    History of conization of cervix - Plan: Cytology - PAP( [place]). 2021. See HPI for history.   Screening for cervical cancer - Plan: Cytology - PAP( [place]).   Return in 1 year for annual.     Olivia Mackie DNP, 3:02 PM 08/06/2022

## 2022-08-12 ENCOUNTER — Encounter: Payer: Self-pay | Admitting: Nurse Practitioner

## 2022-08-12 LAB — CYTOLOGY - PAP
Adequacy: ABSENT
Comment: NEGATIVE
Diagnosis: NEGATIVE
High risk HPV: NEGATIVE

## 2022-08-13 ENCOUNTER — Other Ambulatory Visit: Payer: Self-pay

## 2022-08-13 DIAGNOSIS — B379 Candidiasis, unspecified: Secondary | ICD-10-CM

## 2022-08-13 MED ORDER — FLUCONAZOLE 150 MG PO TABS
150.0000 mg | ORAL_TABLET | Freq: Once | ORAL | 0 refills | Status: AC
Start: 2022-08-13 — End: 2022-08-13

## 2022-08-17 ENCOUNTER — Encounter: Payer: Self-pay | Admitting: Neurology

## 2022-08-20 ENCOUNTER — Other Ambulatory Visit: Payer: Self-pay | Admitting: Neurology

## 2022-08-20 MED ORDER — GABAPENTIN 300 MG PO CAPS: 300.0000 mg | ORAL_CAPSULE | Freq: Three times a day (TID) | ORAL | 11 refills | Status: DC

## 2022-09-02 ENCOUNTER — Ambulatory Visit (HOSPITAL_COMMUNITY): Payer: 59 | Attending: Cardiovascular Disease

## 2022-09-02 DIAGNOSIS — I300 Acute nonspecific idiopathic pericarditis: Secondary | ICD-10-CM

## 2022-09-02 LAB — ECHOCARDIOGRAM LIMITED
Area-P 1/2: 5.38 cm2
S' Lateral: 2.6 cm

## 2022-09-17 ENCOUNTER — Other Ambulatory Visit: Payer: Self-pay | Admitting: Internal Medicine

## 2022-09-26 ENCOUNTER — Ambulatory Visit: Payer: 59 | Admitting: Nurse Practitioner

## 2022-11-04 ENCOUNTER — Inpatient Hospital Stay (HOSPITAL_COMMUNITY): Payer: 59

## 2022-11-04 ENCOUNTER — Emergency Department (HOSPITAL_COMMUNITY): Payer: 59

## 2022-11-04 ENCOUNTER — Other Ambulatory Visit: Payer: Self-pay

## 2022-11-04 ENCOUNTER — Inpatient Hospital Stay (HOSPITAL_COMMUNITY)
Admission: EM | Admit: 2022-11-04 | Discharge: 2022-11-09 | DRG: 501 | Disposition: A | Discharge status: Home / Self Care | Payer: 59 | Attending: Student | Admitting: Student

## 2022-11-04 ENCOUNTER — Encounter (HOSPITAL_COMMUNITY): Payer: Self-pay

## 2022-11-04 DIAGNOSIS — R748 Abnormal levels of other serum enzymes: Secondary | ICD-10-CM | POA: Diagnosis not present

## 2022-11-04 DIAGNOSIS — N3941 Urge incontinence: Secondary | ICD-10-CM | POA: Diagnosis present

## 2022-11-04 DIAGNOSIS — R29898 Other symptoms and signs involving the musculoskeletal system: Secondary | ICD-10-CM

## 2022-11-04 DIAGNOSIS — R700 Elevated erythrocyte sedimentation rate: Secondary | ICD-10-CM | POA: Diagnosis present

## 2022-11-04 DIAGNOSIS — D61818 Other pancytopenia: Secondary | ICD-10-CM | POA: Diagnosis present

## 2022-11-04 DIAGNOSIS — G4700 Insomnia, unspecified: Secondary | ICD-10-CM | POA: Diagnosis not present

## 2022-11-04 DIAGNOSIS — E66812 Obesity, class 2: Secondary | ICD-10-CM | POA: Diagnosis present

## 2022-11-04 DIAGNOSIS — G35 Multiple sclerosis: Secondary | ICD-10-CM | POA: Diagnosis present

## 2022-11-04 DIAGNOSIS — Z79899 Other long term (current) drug therapy: Secondary | ICD-10-CM

## 2022-11-04 DIAGNOSIS — F419 Anxiety disorder, unspecified: Secondary | ICD-10-CM | POA: Diagnosis present

## 2022-11-04 DIAGNOSIS — I10 Essential (primary) hypertension: Secondary | ICD-10-CM | POA: Diagnosis present

## 2022-11-04 DIAGNOSIS — F32A Depression, unspecified: Secondary | ICD-10-CM | POA: Diagnosis present

## 2022-11-04 DIAGNOSIS — Z8679 Personal history of other diseases of the circulatory system: Secondary | ICD-10-CM

## 2022-11-04 DIAGNOSIS — M3219 Other organ or system involvement in systemic lupus erythematosus: Secondary | ICD-10-CM | POA: Diagnosis present

## 2022-11-04 DIAGNOSIS — M6282 Rhabdomyolysis: Secondary | ICD-10-CM | POA: Diagnosis present

## 2022-11-04 DIAGNOSIS — E870 Hyperosmolality and hypernatremia: Secondary | ICD-10-CM | POA: Diagnosis not present

## 2022-11-04 DIAGNOSIS — R29810 Facial weakness: Secondary | ICD-10-CM | POA: Diagnosis present

## 2022-11-04 DIAGNOSIS — Z1152 Encounter for screening for COVID-19: Secondary | ICD-10-CM

## 2022-11-04 DIAGNOSIS — D638 Anemia in other chronic diseases classified elsewhere: Secondary | ICD-10-CM | POA: Diagnosis present

## 2022-11-04 DIAGNOSIS — K5900 Constipation, unspecified: Secondary | ICD-10-CM | POA: Diagnosis present

## 2022-11-04 DIAGNOSIS — R682 Dry mouth, unspecified: Secondary | ICD-10-CM | POA: Diagnosis not present

## 2022-11-04 DIAGNOSIS — Z86001 Personal history of in-situ neoplasm of cervix uteri: Secondary | ICD-10-CM

## 2022-11-04 DIAGNOSIS — R7401 Elevation of levels of liver transaminase levels: Secondary | ICD-10-CM | POA: Diagnosis present

## 2022-11-04 DIAGNOSIS — F413 Other mixed anxiety disorders: Secondary | ICD-10-CM | POA: Diagnosis not present

## 2022-11-04 DIAGNOSIS — Z8249 Family history of ischemic heart disease and other diseases of the circulatory system: Secondary | ICD-10-CM

## 2022-11-04 DIAGNOSIS — D649 Anemia, unspecified: Secondary | ICD-10-CM | POA: Diagnosis present

## 2022-11-04 DIAGNOSIS — E66811 Obesity, class 1: Secondary | ICD-10-CM | POA: Diagnosis not present

## 2022-11-04 DIAGNOSIS — Z9049 Acquired absence of other specified parts of digestive tract: Secondary | ICD-10-CM

## 2022-11-04 DIAGNOSIS — E878 Other disorders of electrolyte and fluid balance, not elsewhere classified: Secondary | ICD-10-CM | POA: Diagnosis not present

## 2022-11-04 DIAGNOSIS — K219 Gastro-esophageal reflux disease without esophagitis: Secondary | ICD-10-CM | POA: Diagnosis present

## 2022-11-04 DIAGNOSIS — Z6838 Body mass index (BMI) 38.0-38.9, adult: Secondary | ICD-10-CM

## 2022-11-04 DIAGNOSIS — M3214 Glomerular disease in systemic lupus erythematosus: Secondary | ICD-10-CM | POA: Diagnosis present

## 2022-11-04 DIAGNOSIS — M329 Systemic lupus erythematosus, unspecified: Secondary | ICD-10-CM | POA: Diagnosis present

## 2022-11-04 DIAGNOSIS — Z87440 Personal history of urinary (tract) infections: Secondary | ICD-10-CM

## 2022-11-04 DIAGNOSIS — R809 Proteinuria, unspecified: Secondary | ICD-10-CM | POA: Diagnosis present

## 2022-11-04 DIAGNOSIS — Z833 Family history of diabetes mellitus: Secondary | ICD-10-CM

## 2022-11-04 DIAGNOSIS — Z7952 Long term (current) use of systemic steroids: Secondary | ICD-10-CM

## 2022-11-04 DIAGNOSIS — E8729 Other acidosis: Secondary | ICD-10-CM | POA: Diagnosis present

## 2022-11-04 DIAGNOSIS — M3212 Pericarditis in systemic lupus erythematosus: Secondary | ICD-10-CM | POA: Diagnosis present

## 2022-11-04 DIAGNOSIS — N179 Acute kidney failure, unspecified: Secondary | ICD-10-CM | POA: Diagnosis present

## 2022-11-04 DIAGNOSIS — Z8269 Family history of other diseases of the musculoskeletal system and connective tissue: Secondary | ICD-10-CM

## 2022-11-04 DIAGNOSIS — Z8619 Personal history of other infectious and parasitic diseases: Secondary | ICD-10-CM

## 2022-11-04 DIAGNOSIS — G35A Relapsing-remitting multiple sclerosis: Secondary | ICD-10-CM | POA: Diagnosis present

## 2022-11-04 DIAGNOSIS — E876 Hypokalemia: Secondary | ICD-10-CM | POA: Diagnosis not present

## 2022-11-04 DIAGNOSIS — R590 Localized enlarged lymph nodes: Secondary | ICD-10-CM | POA: Diagnosis present

## 2022-11-04 DIAGNOSIS — Z9889 Other specified postprocedural states: Secondary | ICD-10-CM

## 2022-11-04 DIAGNOSIS — M609 Myositis, unspecified: Secondary | ICD-10-CM | POA: Diagnosis not present

## 2022-11-04 DIAGNOSIS — Z79624 Long term (current) use of inhibitors of nucleotide synthesis: Secondary | ICD-10-CM

## 2022-11-04 LAB — CBC
HCT: 34.5 % — ABNORMAL LOW (ref 36.0–46.0)
Hemoglobin: 10.9 g/dL — ABNORMAL LOW (ref 12.0–15.0)
MCH: 26.8 pg (ref 26.0–34.0)
MCHC: 31.6 g/dL (ref 30.0–36.0)
MCV: 84.8 fL (ref 80.0–100.0)
Platelets: 185 10*3/uL (ref 150–400)
RBC: 4.07 MIL/uL (ref 3.87–5.11)
RDW: 13.9 % (ref 11.5–15.5)
WBC: 3.3 10*3/uL — ABNORMAL LOW (ref 4.0–10.5)
nRBC: 0 % (ref 0.0–0.2)

## 2022-11-04 LAB — BLOOD GAS, VENOUS
Acid-base deficit: 4.5 mmol/L — ABNORMAL HIGH (ref 0.0–2.0)
Bicarbonate: 21.1 mmol/L (ref 20.0–28.0)
O2 Saturation: 56.1 %
Patient temperature: 37
pCO2, Ven: 40 mm[Hg] — ABNORMAL LOW (ref 44–60)
pH, Ven: 7.33 (ref 7.25–7.43)
pO2, Ven: 35 mm[Hg] (ref 32–45)

## 2022-11-04 LAB — URINALYSIS, ROUTINE W REFLEX MICROSCOPIC
Bilirubin Urine: NEGATIVE
Glucose, UA: NEGATIVE mg/dL
Ketones, ur: NEGATIVE mg/dL
Nitrite: NEGATIVE
Protein, ur: 100 mg/dL — AB
Specific Gravity, Urine: 1.021 (ref 1.005–1.030)
pH: 5 (ref 5.0–8.0)

## 2022-11-04 LAB — RETICULOCYTES
Immature Retic Fract: 4.9 % (ref 2.3–15.9)
RBC.: 4.06 MIL/uL (ref 3.87–5.11)
Retic Ct Pct: <0.4 % — ABNORMAL LOW (ref 0.4–3.1)

## 2022-11-04 LAB — COMPREHENSIVE METABOLIC PANEL
ALT: 105 U/L — ABNORMAL HIGH (ref 0–44)
AST: 391 U/L — ABNORMAL HIGH (ref 15–41)
Albumin: 3 g/dL — ABNORMAL LOW (ref 3.5–5.0)
Alkaline Phosphatase: 39 U/L (ref 38–126)
Anion gap: 14 (ref 5–15)
BUN: 57 mg/dL — ABNORMAL HIGH (ref 6–20)
CO2: 20 mmol/L — ABNORMAL LOW (ref 22–32)
Calcium: 8.9 mg/dL (ref 8.9–10.3)
Chloride: 104 mmol/L (ref 98–111)
Creatinine, Ser: 2.24 mg/dL — ABNORMAL HIGH (ref 0.44–1.00)
GFR, Estimated: 28 mL/min — ABNORMAL LOW (ref >60)
Glucose, Bld: 97 mg/dL (ref 70–99)
Potassium: 3.6 mmol/L (ref 3.5–5.1)
Sodium: 138 mmol/L (ref 135–145)
Total Bilirubin: 0.6 mg/dL (ref 0.3–1.2)
Total Protein: 7.3 g/dL (ref 6.5–8.1)

## 2022-11-04 LAB — PHOSPHORUS: Phosphorus: 3.4 mg/dL (ref 2.5–4.6)

## 2022-11-04 LAB — TROPONIN I (HIGH SENSITIVITY): Troponin I (High Sensitivity): 13 ng/L (ref <18)

## 2022-11-04 LAB — MAGNESIUM: Magnesium: 2.1 mg/dL (ref 1.7–2.4)

## 2022-11-04 LAB — TSH: TSH: 0.661 u[IU]/mL (ref 0.350–4.500)

## 2022-11-04 LAB — HCG, SERUM, QUALITATIVE: Preg, Serum: NEGATIVE

## 2022-11-04 LAB — PROCALCITONIN: Procalcitonin: 0.67 ng/mL

## 2022-11-04 LAB — SARS CORONAVIRUS 2 BY RT PCR: SARS Coronavirus 2 by RT PCR: NEGATIVE

## 2022-11-04 MED ORDER — GABAPENTIN 300 MG PO CAPS
300.0000 mg | ORAL_CAPSULE | Freq: Three times a day (TID) | ORAL | Status: DC
  Administered 2022-11-05 – 2022-11-09 (×14): 300 mg via ORAL
  Filled 2022-11-04 (×14): qty 1

## 2022-11-04 MED ORDER — ONDANSETRON HCL 4 MG PO TABS: 4.0000 mg | ORAL_TABLET | Freq: Four times a day (QID) | ORAL | Status: DC | PRN

## 2022-11-04 MED ORDER — HYDROXYCHLOROQUINE SULFATE 200 MG PO TABS
200.0000 mg | ORAL_TABLET | Freq: Two times a day (BID) | ORAL | Status: DC
  Administered 2022-11-05 – 2022-11-09 (×10): 200 mg via ORAL
  Filled 2022-11-04 (×12): qty 1

## 2022-11-04 MED ORDER — SODIUM CHLORIDE 0.9 % IV BOLUS
1000.0000 mL | Freq: Once | INTRAVENOUS | Status: AC
  Administered 2022-11-04: 1000 mL via INTRAVENOUS

## 2022-11-04 MED ORDER — ONDANSETRON HCL 4 MG/2ML IJ SOLN
4.0000 mg | Freq: Four times a day (QID) | INTRAMUSCULAR | Status: DC | PRN
  Administered 2022-11-08: 4 mg via INTRAVENOUS
  Filled 2022-11-04: qty 2

## 2022-11-04 MED ORDER — AZATHIOPRINE 50 MG PO TABS
150.0000 mg | ORAL_TABLET | Freq: Every day | ORAL | Status: DC
  Administered 2022-11-05 – 2022-11-09 (×5): 150 mg via ORAL
  Filled 2022-11-04 (×5): qty 3

## 2022-11-04 MED ORDER — SODIUM CHLORIDE 0.9 % IV SOLN: INTRAVENOUS | Status: DC

## 2022-11-04 NOTE — ED Notes (Signed)
[facility] transport setup for pt

## 2022-11-04 NOTE — Assessment & Plan Note (Signed)
Could be potentially secondary to dehydration versus lupus renal disease.  Appreciate nephrology consult.  Rehydrate obtain electrolytes check complement levels ANA and double-stranded DNA At this point no indication for dialysis if renal function does not improved with IV fluids may need biopsy keep n.p.o. postmidnight

## 2022-11-04 NOTE — Assessment & Plan Note (Signed)
Check CK, check hepatitis serologies, Rehydrate, obtain right upper quadrant ultrasound Evaluate if related to lupus flare versus dehydration versus anything else.  If persists will need GI consult

## 2022-11-04 NOTE — Assessment & Plan Note (Addendum)
Allow permissive hypertension for tonight when stable she has been off her toprol

## 2022-11-04 NOTE — Assessment & Plan Note (Signed)
ER provider discussed case with neurology.  They feel like her weakness currently is more secondary to medical disease.  If persists despite stabilization would need MRI head and spine with without contrast once canal function improves please consult neurology if symptoms persist when she is being rehydrated and kidney function improves

## 2022-11-04 NOTE — Assessment & Plan Note (Signed)
Need to be followed up as an outpatient for nutrition

## 2022-11-04 NOTE — ED Triage Notes (Signed)
Patient reports intermittent weakness x "a couple weeks." Worse today. Hx of lupus and MS. Patient states it feels like a flare up of her lupus.

## 2022-11-04 NOTE — ED Provider Notes (Signed)
[place] EMERGENCY DEPARTMENT AT [facility] Provider Note   CSN: [id number] Arrival date & time: 11/04/22  1732     History  Chief Complaint  Patient presents with   Weakness    Emily Hicks is a 37 y.o. femalewith history of MS, lupus, history of lupus pericarditis .  Patient reports progressively worsening weakness of the lower extremities below the knees with pain in her lower extremities.  She has had this in the past.  Patient states that she was admitted back in May of this past year and when she was discharged her symptoms began in her lower extremities.  She had MRI head neck and C-spine performed in the outpatient setting in July 2024 which showed no acute MS lesions.  Patient reports that she was on a steroid taper at that time.  The month before last she was doing well and was walking without a walker however over the past month she has had progressively worsening weakness and is now back trying to use a walker.  She reports no appetite, difficulty ambulating in her home,   Weakness      Home Medications Prior to Admission medications   Medication Sig Start Date End Date Taking? Authorizing Provider  azathioprine (IMURAN) 100 MG tablet Take 150 mg by mouth daily.    [provider]  clobetasol ointment (TEMOVATE) 0.05 % Apply 1 application  topically 2 (two) times daily as needed (as directed). 06/04/21   [provider]  EXCEDRIN EXTRA STRENGTH [phone] MG tablet Take 1 tablet by mouth every 6 (six) hours as needed for headache or migraine.    [provider]  gabapentin (NEURONTIN) 300 MG capsule Take 1 capsule (300 mg total) by mouth 3 (three) times daily. 08/20/22   Sater, Pearletha Furl, MD  hydroxychloroquine (PLAQUENIL) 200 MG tablet Take 200 mg by mouth daily.    [provider]  ibuprofen (ADVIL) 200 MG tablet Take 200-400 mg by mouth every 6 (six) hours as needed for mild pain (or headaches).    [provider]   ketoconazole (NIZORAL) 2 % shampoo Apply 1 Application topically 2 (two) times a week.    [provider]  metoprolol succinate (TOPROL XL) 25 MG 24 hr tablet Take 1 tablet (25 mg total) by mouth at bedtime. 08/04/22   Chandrasekhar, Mahesh A, MD  predniSONE (DELTASONE) 10 MG tablet Take 10-40 mg by mouth See admin instructions. Take 40 mg WITH FOOD by mouth once daily for 7 days then 30 mg once daily for 7 days then 20 mg once daily for 7 days then 10 mg once a day thereafter OR AS OTHERWISE INSTRUCTED 06/02/22   [provider]  tamsulosin (FLOMAX) 0.4 MG CAPS capsule Take 1 capsule (0.4 mg total) by mouth daily. 07/28/22   Sater, Pearletha Furl, MD  TYLENOL 325 MG CAPS Take 325-650 mg by mouth every 6 (six) hours as needed (for headaches or pain).    [provider]  Vitamin D, Ergocalciferol, (DRISDOL) 1.25 MG (50000 UNIT) CAPS capsule Take 1 capsule (50,000 Units total) by mouth every 7 (seven) days. 08/16/21   Marcine Matar, MD      Allergies    Patient has no known allergies.    Review of Systems   Review of Systems  Neurological:  Positive for weakness.    Physical Exam Updated Vital Signs BP 108/67 (BP Location: Right Arm)   Pulse (!) 104   Temp 98.7 F (37.1 C) (  Oral)   Resp 14   Ht 5\' 1"  (1.549 m)   Wt 93.4 kg   SpO2 99%   BMI 38.92 kg/m  Physical Exam Vitals and nursing note reviewed.  Constitutional:      General: She is not in acute distress.    Appearance: She is well-developed. She is not diaphoretic.  HENT:     Head: Normocephalic and atraumatic.     Right Ear: External ear normal.     Left Ear: External ear normal.     Nose: Nose normal.     Mouth/Throat:     Mouth: Mucous membranes are moist.  Eyes:     General: No scleral icterus.    Conjunctiva/sclera: Conjunctivae normal.  Cardiovascular:     Rate and Rhythm: Normal rate and regular rhythm.     Heart sounds: Normal heart sounds. No murmur heard.    No friction rub. No gallop.   Pulmonary:     Effort: Pulmonary effort is normal. No respiratory distress.     Breath sounds: Normal breath sounds.  Abdominal:     General: Bowel sounds are normal. There is no distension.     Palpations: Abdomen is soft. There is no mass.     Tenderness: There is no abdominal tenderness. There is no guarding.  Musculoskeletal:     Cervical back: Normal range of motion.     Comments: Mild BL tenderness , LE Dp/pt pulses 2+, Patient Strength 5/5 dorsi and plantar flex 4/5 with hip flex, knee extension BL  Skin:    General: Skin is warm and dry.  Neurological:     Mental Status: She is alert and oriented to person, place, and time.     Deep Tendon Reflexes:     Reflex Scores:      Patellar reflexes are 3+ on the right side and 3+ on the left side. Psychiatric:        Behavior: Behavior normal.     ED Results / Procedures / Treatments   Labs (all labs ordered are listed, but only abnormal results are displayed) Labs Reviewed  CBC - Abnormal; Notable for the following components:      Result Value   WBC 3.3 (*)    Hemoglobin 10.9 (*)    HCT 34.5 (*)    All other components within normal limits  URINALYSIS, ROUTINE W REFLEX MICROSCOPIC - Abnormal; Notable for the following components:   APPearance HAZY (*)    Hgb urine dipstick LARGE (*)    Protein, ur 100 (*)    Leukocytes,Ua TRACE (*)    Bacteria, UA RARE (*)    All other components within normal limits  COMPREHENSIVE METABOLIC PANEL - Abnormal; Notable for the following components:   CO2 20 (*)    BUN 57 (*)    Creatinine, Ser 2.24 (*)    Albumin 3.0 (*)    AST 391 (*)    ALT 105 (*)    GFR, Estimated 28 (*)    All other components within normal limits  URINE CULTURE  HCG, SERUM, QUALITATIVE    EKG EKG Interpretation Date/Time:  Tuesday November 04 2022 17:57:31 EDT Ventricular Rate:  105 PR Interval:  148 QRS Duration:  92 QT Interval:  342 QTC Calculation: 452 R Axis:   50  Text  Interpretation: Sinus tachycardia Otherwise normal ECG When compared with ECG of 16-Jun-2022 14:22, PREVIOUS ECG IS PRESENT Confirmed by Beckey Downing [phone]) on 11/04/2022 6:00:49 PM  Radiology No results found.  Procedures .Critical Care  Performed by: Arthor Captain, PA-C Authorized by: Arthor Captain, PA-C   Critical care provider statement:    Critical care time (minutes):  60   Critical care time was exclusive of:  Separately billable procedures and treating other patients   Critical care was necessary to treat or prevent imminent or life-threatening deterioration of the following conditions:  Dehydration and renal failure   Critical care was time spent personally by me on the following activities:  Development of treatment plan with patient or surrogate, discussions with consultants, evaluation of patient's response to treatment, examination of patient, ordering and review of laboratory studies, ordering and review of radiographic studies, ordering and performing treatments and interventions, pulse oximetry, re-evaluation of patient's condition and review of old charts     Medications Ordered in ED Medications - No data to display  ED Course/ Medical Decision Making/ A&P Clinical Course as of 11/04/22 2130  Tue Nov 04, 2022  2119 Comprehensive metabolic panel(!) [AH]  2119 Creatinine(!): 2.24 [AH]    Clinical Course User Index [AH] Arthor Captain, PA-C                                 Medical Decision Making This patient presents to the ED for concern of LE weakness , loss of appetite, this involves an extensive number of treatment options, and is a complaint that carries with it a high risk of complications and morbidity.  The differential diagnosis of weakness includes but is not limited to neurologic causes (GBS, myasthenia gravis, CVA, MS, ALS, transverse myelitis, spinal cord injury, CVA, botulism, ) and other causes: ACS, Arrhythmia, syncope, orthostatic hypotension,  sepsis, hypoglycemia, electrolyte disturbance, hypothyroidism, respiratory failure, symptomatic anemia, dehydration, heat injury, polypharmacy, malignancy.    Co morbidities:      MS/ Lupus  Social Determinants of Health:       SDOH Screenings Food Insecurity: No Food Insecurity (11/05/2022) Housing: Low Risk  (11/05/2022) Transportation Needs: No Transportation Needs (11/05/2022) Utilities: Not At Risk (11/05/2022) Depression (PHQ2-9): Low Risk  (08/15/2021) Tobacco Use: Low Risk  (11/06/2022)   Additional history:  {Additional history obtained from emr, siblings at bedside {External records from outside source obtained and reviewed including neuro notes  Lab Tests:  I Ordered, and personally interpreted labs.  The pertinent results include:   CR 2.24>>0.65 BUN 57>>12 AST 391>>15 ALT 105 >> 10  Imaging Studies:  N/A  Cardiac Monitoring/ECG:    Medicines ordered and prescription drug management:  Test Considered:       CT renal - however she has no flankpain   Critical Interventions:       FLuids  Consultations Obtained: Dr. Adela Glimpse for admission  Problem List / ED Course:       (N17.9) AKI (acute kidney injury) (HCC)  (primary encounter diagnosis)   (R74.01) Transaminitis  (R29.898) Weakness of both lower extremities   MDM: Compliocated patient with a hx of lupus and MS with weakness, pain and fatigue. New AKI/ transaminitis. Nephritis, UTI? Dehydration? MS or Lupus flare. Needs inpatient work up.   Dispostion:  After consideration of the diagnostic results and the patients response to treatment, I feel that the patent would benefit from admission.    Amount and/or Complexity of Data Reviewed Labs: ordered. Decision-making details documented in ED Course. Radiology: ordered.  Risk Decision regarding hospitalization.           Final Clinical Impression(s) / ED  Diagnoses Final diagnoses:  None    Rx / DC Orders ED Discharge  Orders     None         Arthor Captain, PA-C 11/06/22 1845    Terald Sleeper, MD 11/07/22 0830

## 2022-11-04 NOTE — Assessment & Plan Note (Addendum)
Discussed with nephrology In the past ANA and double-stranded DNA positive also in the past prior Smith positive and low complements.  Will repeat complements level today Current appears to be on Plaquenil and Imuran will continue She finished Prednisone Seems that she was on CellCept in the past but it stopped working Hold off on increasing dose of steroids for right now until seen by nephrology in a.m.

## 2022-11-04 NOTE — Subjective & Objective (Signed)
Weakness for the past few weeks Has known hx of Lupus and MS Reports feels like a lupus flair Endorses lower extremity below the knees pain she was admitted in May and since then has been having worsening weakness of her lower extremities had MRI of her neck and C-spine done in July which did not show any acute MS lesions she was using steroids Transiently gotten better for about a month and was even able to walk without a walker but then months later became again weak and now using a walker again no appetite

## 2022-11-04 NOTE — Assessment & Plan Note (Signed)
Obtain anemia panel transfuse as needed for hemoglobin below 7 hemoglobin appears to be stable

## 2022-11-04 NOTE — H&P (Signed)
Emily Hicks QIO:[id number] DOB: Oct 05, 1985 DOA: 11/04/2022     PCP: Patient, No Pcp Per   Outpatient Specialists:   CARDS:  Dr. Christell Constant, MD Rheumatology Dr. Deanne Coffer   NEurology    Dr. Leilani Merl    Patient arrived to ER on 11/04/22 at 1732 Referred by Attending Trifan, Kermit Balo, MD   Patient coming from:    home Lives  With family    Chief Complaint:   Chief Complaint  Patient presents with   Weakness    HPI: Emily Hicks is a 37 y.o. female with medical history significant of complex medical history including multiple sclerosis, lupus, pericarditis, pancytopenia    Presented with generalized weakness worsening leg strength Weakness for the past few weeks Has known hx of Lupus and MS Reports feels like a lupus flair Endorses lower extremity below the knees pain she was admitted in May and since then has been having worsening weakness of her lower extremities had MRI of her neck and C-spine done in July which did not show any acute MS lesions she was using steroids Transiently gotten better for about a month and was even able to walk without a walker but then months later became again weak and now using a walker again no appetite    Reports family felt she was a bit warm No sick contacts Has been off prednisone for about month still on Imuran and plaquenill Cellcept was not helping in the past  Decreased PO intake occasional constipation but had some diarrhea last week   Has not been taking tylenol  Has been a while since had advil   Denies significant ETOH intake   Does not smoke   Lab Results  Component Value Date   SARSCOV2NAA NEGATIVE 06/13/2021   SARSCOV2NAA NEGATIVE 04/20/2021   SARSCOV2NAA NEGATIVE 01/12/2020   SARSCOV2NAA NEGATIVE 07/27/2018      Regarding pertinent Chronic problems:    MS on Imuran  Lupus on Plaquenil, prednisone     HTN on Toprol  last echo  Recent Results (from the past 32440 hour(s))  ECHOCARDIOGRAM COMPLETE    Collection Time: 06/14/21 11:33 AM  Result Value   Weight 2,960   Height 61   BP 146/97   S' Lateral 3.10   AR max vel 2.31   AV Peak grad 13.2   Ao pk vel 1.82   Area-P 1/2 5.20   Narrative      ECHOCARDIOGRAM REPORT      IMPRESSIONS     1. Left ventricular ejection fraction, by estimation, is 60 to 65%. The left ventricle has normal function. The left ventricle has no regional wall motion abnormalities. Indeterminate diastolic filling due to E-A fusion.  2. Right ventricular systolic function is normal. The right ventricular size is normal. There is normal pulmonary artery systolic pressure.  3. Left atrial size was mildly dilated.  4. Trivial mitral valve regurgitation.  5. The aortic valve is normal in structure. Aortic valve regurgitation is not visualized.  6. The inferior vena cava is normal in size with <50% respiratory variability, suggesting right atrial pressure of 8 mmHg.  Comparison(s): No significant change from prior study.          obesity-   BMI Readings from Last 1 Encounters:  11/04/22 38.92 kg/m        Chronic anemia - baseline hg Hemoglobin & Hematocrit  Recent Labs    06/16/22 1835 06/17/22 0142 11/04/22 1811  HGB 11.6* 10.3* 10.9*  Iron/TIBC/Ferritin/ %Sat    Component Value Date/Time   IRON 39 08/15/2021 1451   TIBC 287 08/15/2021 1451   FERRITIN 238 (H) 08/15/2021 1451   IRONPCTSAT 14 (L) 08/15/2021 1451    While in ER: Clinical Course as of 11/04/22 2149  Tue Nov 04, 2022  2119 Comprehensive metabolic panel(!) [AH]  2119 Creatinine(!): 2.24 [AH]    Clinical Course User Index [AH] Arthor Captain, PA-C       Lab Orders         Urine Culture         CBC         Urinalysis, Routine w reflex microscopic -Urine, Clean Catch         hCG, serum, qualitative         Comprehensive metabolic panel      CXR -  NON acute KUB non acute Right upper quadrant ultrasound  Renal ultrasound   following Medications were ordered in  ER: Medications  sodium chloride 0.9 % bolus 1,000 mL (1,000 mLs Intravenous New Bag/Given 11/04/22 2140)    _______________________________________________________ ER Provider Called:      Neurology Dr. Wilford Corner They Recommend admit to medicine  treat medically At some point will need MRI w/wo brain and C-spine when renal function improves, if cont to be weak will be happy to consult      ED Triage Vitals  Encounter Vitals Group     BP 11/04/22 1750 108/67     Systolic BP Percentile --      Diastolic BP Percentile --      Pulse Rate 11/04/22 1750 (!) 104     Resp 11/04/22 1750 14     Temp 11/04/22 1750 98.7 F (37.1 C)     Temp Source 11/04/22 1750 Oral     SpO2 11/04/22 1750 99 %     Weight 11/04/22 1750 206 lb (93.4 kg)     Height 11/04/22 1750 5\' 1"  (1.549 m)     Head Circumference --      Peak Flow --      Pain Score 11/04/22 1804 3     Pain Loc --      Pain Education --      Exclude from Growth Chart --   MPNT(61)@     _________________________________________ Significant initial  Findings: Abnormal Labs Reviewed  CBC - Abnormal; Notable for the following components:      Result Value   WBC 3.3 (*)    Hemoglobin 10.9 (*)    HCT 34.5 (*)    All other components within normal limits  URINALYSIS, ROUTINE W REFLEX MICROSCOPIC - Abnormal; Notable for the following components:   APPearance HAZY (*)    Hgb urine dipstick LARGE (*)    Protein, ur 100 (*)    Leukocytes,Ua TRACE (*)    Bacteria, UA RARE (*)    All other components within normal limits  COMPREHENSIVE METABOLIC PANEL - Abnormal; Notable for the following components:   CO2 20 (*)    BUN 57 (*)    Creatinine, Ser 2.24 (*)    Albumin 3.0 (*)    AST 391 (*)    ALT 105 (*)    GFR, Estimated 28 (*)    All other components within normal limits       Cardiac Panel (last 3 results) No results for input(s): "CKTOTAL", "CKMB", "TROPONINIHS", "RELINDX" in the last 72 hours.   ECG: Ordered Personally reviewed  and interpreted by me showing: HR :  105 Rhythm:Sinus tachycardia Otherwise normal ECG When compared with ECG of 16-Jun-2022 14:22, PREVIOUS ECG IS PRESENT QTC 452   ____________________ This patient meets SIRS Criteria and may be septic.    The recent clinical data is shown below. Vitals:   11/04/22 1750 11/04/22 2000  BP: 108/67 129/82  Pulse: (!) 104 96  Resp: 14 16  Temp: 98.7 F (37.1 C)   TempSrc: Oral   SpO2: 99% 99%  Weight: 93.4 kg   Height: 5\' 1"  (1.549 m)     WBC     Component Value Date/Time   WBC 3.3 (L) 11/04/2022 1811   LYMPHSABS 0.8 06/16/2021 0235   LYMPHSABS 0.9 05/11/2018 1524   MONOABS 0.6 06/16/2021 0235   EOSABS 0.0 06/16/2021 0235   EOSABS 0.0 05/11/2018 1524   BASOSABS 0.0 06/16/2021 0235   BASOSABS 0.0 05/11/2018 1524    Lactic Acid, Venous    Component Value Date/Time   LATICACIDVEN 1.0 06/16/2022 2334    Procalcitonin   Ordered      UA   no evidence of UTI    Urine analysis:    Component Value Date/Time   COLORURINE YELLOW 11/04/2022 1811   APPEARANCEUR HAZY (A) 11/04/2022 1811   LABSPEC 1.021 11/04/2022 1811   PHURINE 5.0 11/04/2022 1811   GLUCOSEU NEGATIVE 11/04/2022 1811   HGBUR LARGE (A) 11/04/2022 1811   BILIRUBINUR NEGATIVE 11/04/2022 1811   BILIRUBINUR Neg 05/08/2010 1553   KETONESUR NEGATIVE 11/04/2022 1811   PROTEINUR 100 (A) 11/04/2022 1811   UROBILINOGEN 1.0 11/11/2013 0150   NITRITE NEGATIVE 11/04/2022 1811   LEUKOCYTESUR TRACE (A) 11/04/2022 1811    Results for orders placed or performed during the hospital encounter of 06/16/22  Respiratory (~20 pathogens) panel by PCR     Status: None   Collection Time: 06/16/22  8:00 PM   Specimen: Nasopharyngeal Swab; Respiratory  Result Value Ref Range Status   Adenovirus NOT DETECTED NOT DETECTED Final   Coronavirus 229E NOT DETECTED NOT DETECTED Final    Comment: (NOTE) The Coronavirus on the Respiratory Panel, DOES NOT test for the novel  Coronavirus (2019 nCoV)     Coronavirus HKU1 NOT DETECTED NOT DETECTED Final   Coronavirus NL63 NOT DETECTED NOT DETECTED Final   Coronavirus OC43 NOT DETECTED NOT DETECTED Final   Metapneumovirus NOT DETECTED NOT DETECTED Final   Rhinovirus / Enterovirus NOT DETECTED NOT DETECTED Final   Influenza A NOT DETECTED NOT DETECTED Final   Influenza B NOT DETECTED NOT DETECTED Final   Parainfluenza Virus 1 NOT DETECTED NOT DETECTED Final   Parainfluenza Virus 2 NOT DETECTED NOT DETECTED Final   Parainfluenza Virus 3 NOT DETECTED NOT DETECTED Final   Parainfluenza Virus 4 NOT DETECTED NOT DETECTED Final   Respiratory Syncytial Virus NOT DETECTED NOT DETECTED Final   Bordetella pertussis NOT DETECTED NOT DETECTED Final   Bordetella Parapertussis NOT DETECTED NOT DETECTED Final   Chlamydophila pneumoniae NOT DETECTED NOT DETECTED Final   Mycoplasma pneumoniae NOT DETECTED NOT DETECTED Final    Comment: Performed at [facility], [address], [place], [place] [address]  Urine Culture (for pregnant, neutropenic or urologic patients or patients with an indwelling urinary catheter)     Status: Abnormal   Collection Time: 06/16/22  8:00 PM   Specimen: Urine, Clean Catch  Result Value Ref Range Status   Specimen Description   Final    URINE, CLEAN CATCH Performed at [facility], [address] [place], [place] [address]  Special Requests   Final    NONE Performed at [facility] [address] [place], [place] [address]    Culture MULTIPLE SPECIES PRESENT, SUGGEST RECOLLECTION (A)  Final   Report Status 06/18/2022 FINAL  Final  Culture, blood (Routine X 2) w Reflex to ID Panel     Status: None   Collection Time: 06/16/22  9:18 PM   Specimen: BLOOD RIGHT ARM  Result Value Ref Range Status   Specimen Description BLOOD RIGHT ARM  Final   Special Requests BOTTLES DRAWN AEROBIC AND ANAEROBIC  Final   Culture   Final    NO GROWTH 5 DAYS Performed at [facility]  Lab, [address] [place], [place] [address]    Report Status 06/21/2022 FINAL  Final  Culture, blood (Routine X 2) w Reflex to ID Panel     Status: None   Collection Time: 06/16/22  9:21 PM   Specimen: BLOOD RIGHT HAND  Result Value Ref Range Status   Specimen Description   Final    BLOOD RIGHT HAND Performed at [facility], [address], [place], [place] [address]    Special Requests   Final    BOTTLES DRAWN AEROBIC AND ANAEROBIC Blood Culture adequate volume Performed at [facility], [address] [place], [place] [address]    Culture   Final    NO GROWTH 5 DAYS Performed at [facility], [address] [place], [place] [address]    Report Status 06/21/2022 FINAL  Final    VBG pending ABG    Component Value Date/Time   HCO3 23.9 07/16/2018 1537   HCO3 23.4 07/16/2018 1537   TCO2 22 01/16/2020 0631   ACIDBASEDEF 1.0 07/16/2018 1537   ACIDBASEDEF 1.0 07/16/2018 1537   O2SAT 64.0 07/16/2018 1537   O2SAT 99.0 07/16/2018 1537    __________________________________________________________ Recent Labs  Lab 11/04/22 1811  NA 138  K 3.6  CO2 20*  GLUCOSE 97  BUN 57*  CREATININE 2.24*  CALCIUM 8.9    Cr  Up from baseline see below Lab Results  Component Value Date   CREATININE 2.24 (H) 11/04/2022   CREATININE 0.65 06/17/2022   CREATININE 0.74 06/16/2022    Recent Labs  Lab 11/04/22 1811  AST 391*  ALT 105*  ALKPHOS 39  BILITOT 0.6  PROT 7.3  ALBUMIN 3.0*   Lab Results  Component Value Date   CALCIUM 8.9 11/04/2022   PHOS 2.1 (L) 06/13/2021        Plt: Lab Results  Component Value Date   PLT 185 11/04/2022    Recent Labs  Lab 11/04/22 1811  WBC 3.3*  HGB 10.9*  HCT 34.5*  MCV 84.8  PLT 185    HG/HCT stable,      Component Value Date/Time   HGB 10.9 (L) 11/04/2022 1811   HGB 11.9 08/15/2021 1451   HCT 34.5 (L) 11/04/2022 1811   HCT 37.7 08/15/2021 1451   MCV 84.8 11/04/2022 1811   MCV 83 08/15/2021  1451    _______________________________________________ Hospitalist was called for admission for   AKI  Transaminitis,   Weakness of both lower extremities     The following Work up has been ordered so far:  Orders Placed This Encounter  Procedures   Urine Culture   CBC   Urinalysis, Routine w reflex microscopic -Urine, Clean Catch   hCG, serum, qualitative   Comprehensive metabolic panel   Consult to hospitalist  EKG 12-Lead     OTHER Significant initial  Findings:  labs showing:     DM  labs:  HbA1C: Recent Labs    06/16/22 1835 06/16/22 2118  HGBA1C 5.4 5.4    Cultures:    Component Value Date/Time   SDES  06/16/2022 2121    BLOOD RIGHT HAND Performed at [facility], [address] [place], [place] [address]    SPECREQUEST  06/16/2022 2121    BOTTLES DRAWN AEROBIC AND ANAEROBIC Blood Culture adequate volume Performed at [facility], [address], [place], [place] [address]    CULT  06/16/2022 2121    NO GROWTH 5 DAYS Performed at [facility], [address] [place], [place] [address]    REPTSTATUS 06/21/2022 FINAL 06/16/2022 2121     Radiological Exams on Admission: US RENAL  Result Date: 11/04/2022 CLINICAL DATA:  Acute kidney injury EXAM: RENAL / URINARY TRACT ULTRASOUND COMPLETE COMPARISON:  CT 06/16/2022 FINDINGS: Right Kidney: Renal measurements: 11.5 x 4.8 x 5.7 cm = volume: 165 ML. Mildly increased echotexture. No mass or hydronephrosis. Left Kidney: Renal measurements: 12.7 x 6.3 x 5.7 cm = volume: 238 mL. Mildly increased echotexture. No mass or hydronephrosis. Bladder: Appears normal for degree of bladder distention. Other: None. IMPRESSION: No acute findings.  No hydronephrosis. Increased echotexture within the kidneys compatible with chronic medical renal disease. Electronically Signed   By: Charlett Nose M.D.   On: 11/04/2022 23:47   US Abdomen Limited RUQ (LIVER/GB)  Result Date: 11/04/2022 CLINICAL  DATA:  Elevated LFTs. EXAM: ULTRASOUND ABDOMEN LIMITED RIGHT UPPER QUADRANT COMPARISON:  Right upper quadrant ultrasound dated 10/30/2010. FINDINGS: Gallbladder: Cholecystectomy Common bile duct: Diameter: 2 mm Liver: The liver is unremarkable as visualized. Portal vein is patent on color Doppler imaging with normal direction of blood flow towards the liver. Other: None. IMPRESSION: Cholecystectomy, otherwise unremarkable right upper quadrant ultrasound. Electronically Signed   By: Elgie Collard M.D.   On: 11/04/2022 23:23   DG Abd 1 View  Result Date: 11/04/2022 CLINICAL DATA:  Nausea and abdominal pain EXAM: ABDOMEN - 1 VIEW COMPARISON:  None Available. FINDINGS: Scattered large and small bowel gas is noted. No obstructive changes are noted. Changes of prior cholecystectomy are seen. No free air is noted. No bony abnormality is noted. IMPRESSION: No acute abnormality seen. Electronically Signed   By: Alcide Clever M.D.   On: 11/04/2022 22:45   DG Chest Port 1 View  Result Date: 11/04/2022 CLINICAL DATA:  Weakness EXAM: PORTABLE CHEST 1 VIEW COMPARISON:  06/16/2018 FINDINGS: The heart size and mediastinal contours are within normal limits. Both lungs are clear. The visualized skeletal structures are unremarkable. IMPRESSION: No active disease. Electronically Signed   By: Alcide Clever M.D.   On: 11/04/2022 22:44   _______________________________________________________________________________________________________ Latest  Blood pressure 129/82, pulse 96, temperature 98.7 F (37.1 C), temperature source Oral, resp. rate 16, height 5\' 1"  (1.549 m), weight 93.4 kg, SpO2 99%.   Vitals  labs and radiology finding personally reviewed  Review of Systems:    Pertinent positives include:   fatigue,  Constitutional:  No weight loss, night sweats, Fevers, chills, weight loss  HEENT:  No headaches, Difficulty swallowing,Tooth/dental problems,Sore throat,  No sneezing, itching, ear ache, nasal  congestion, post nasal drip,  Cardio-vascular:  No chest pain, Orthopnea, PND, anasarca, dizziness, palpitations.no Bilateral lower extremity swelling  GI:  No heartburn, indigestion, abdominal pain, nausea, vomiting, diarrhea, change in bowel habits, loss of appetite,  melena, blood in stool, hematemesis Resp:  no shortness of breath at rest. No dyspnea on exertion, No excess mucus, no productive cough, No non-productive cough, No coughing up of blood.No change in color of mucus.No wheezing. Skin:  no rash or lesions. No jaundice GU:  no dysuria, change in color of urine, no urgency or frequency. No straining to urinate.  No flank pain.  Musculoskeletal:  No joint pain or no joint swelling. No decreased range of motion. No back pain.  Psych:  No change in mood or affect. No depression or anxiety. No memory loss.  Neuro: no localizing neurological complaints, no tingling, no weakness, no double vision, no gait abnormality, no slurred speech, no confusion  All systems reviewed and apart from HOPI all are negative _______________________________________________________________________________________________ Past Medical History:   Past Medical History:  Diagnosis Date   Cervical intraepithelial neoplasia III    Gallstones    GERD (gastroesophageal reflux disease)    only prn OTC occasionally   History of blood transfusion 2020   Lupus dx 2020   Multiple sclerosis (HCC) dx 2020   Numbness and tingling of left leg     Past Surgical History:  Procedure Laterality Date   CERVICAL CONIZATION W/BX N/A 01/16/2020   Procedure: CERVICAL CONE BIOPSY, ENDOCERVICAL CURETTAGE;  Surgeon: Theresia Majors, MD;  Location: [facility] ;  Service: Gynecology;  Laterality: N/A;  request 7:30am OR start in [place] Gyn block requests 45 minutes   CHOLECYSTECTOMY N/A 12/28/2013   Procedure: LAPAROSCOPIC CHOLECYSTECTOMY;  Surgeon: Claud Kelp, MD;  Location: [facility];  Service:  General;  Laterality: N/A;    Social History:  Ambulatory  walker       reports that she has never smoked. She has never used smokeless tobacco. She reports that she does not drink alcohol and does not use drugs.  Family History:   Family History  Problem Relation Age of Onset   Hypertension Father    Hypertension Mother    Hypertension Sister    Diabetes Maternal Grandmother    Diabetes Maternal Grandfather    ______________________________________________________________________________________________ Allergies: No Known Allergies   Prior to Admission medications   Medication Sig Start Date End Date Taking? Authorizing Provider  azathioprine (IMURAN) 100 MG tablet Take 150 mg by mouth daily.    [provider]  clobetasol ointment (TEMOVATE) 0.05 % Apply 1 application  topically 2 (two) times daily as needed (as directed). 06/04/21   [provider]  EXCEDRIN EXTRA STRENGTH [id number] MG tablet Take 1 tablet by mouth every 6 (six) hours as needed for headache or migraine.    [provider]  gabapentin (NEURONTIN) 300 MG capsule Take 1 capsule (300 mg total) by mouth 3 (three) times daily. 08/20/22   Sater, Pearletha Furl, MD  hydroxychloroquine (PLAQUENIL) 200 MG tablet Take 200 mg by mouth daily.    [provider]  ibuprofen (ADVIL) 200 MG tablet Take 200-400 mg by mouth every 6 (six) hours as needed for mild pain (or headaches).    [provider]  ketoconazole (NIZORAL) 2 % shampoo Apply 1 Application topically 2 (two) times a week.    [provider]  metoprolol succinate (TOPROL XL) 25 MG 24 hr tablet Take 1 tablet (25 mg total) by mouth at bedtime. 08/04/22   Chandrasekhar, Mahesh A, MD  predniSONE (DELTASONE) 10 MG tablet Take 10-40 mg by mouth See admin instructions. Take 40 mg WITH FOOD by mouth once daily for 7 days then 30 mg once  daily for 7 days then 20 mg once daily for 7 days then 10 mg once a day thereafter OR AS  OTHERWISE INSTRUCTED 06/02/22   [provider]  tamsulosin (FLOMAX) 0.4 MG CAPS capsule Take 1 capsule (0.4 mg total) by mouth daily. 07/28/22   Sater, Pearletha Furl, MD  TYLENOL 325 MG CAPS Take 325-650 mg by mouth every 6 (six) hours as needed (for headaches or pain).    [provider]  Vitamin D, Ergocalciferol, (DRISDOL) 1.25 MG (50000 UNIT) CAPS capsule Take 1 capsule (50,000 Units total) by mouth every 7 (seven) days. 08/16/21   Marcine Matar, MD    ___________________________________________________________________________________________________ Physical Exam:    11/04/2022    8:00 PM 11/04/2022    5:50 PM 08/06/2022    2:51 PM  Vitals with BMI  Height  5\' 1"  5\' 1"   Weight  206 lbs 187 lbs  BMI  38.94 35.35  Systolic 129 108 829  Diastolic 82 67 78  Pulse 96 104 76    1. General:  in No  Acute distress   Chronically ill   -appearing 2. Psychological: Alert and   Oriented 3. Head/ENT:    Dry Mucous Membranes                          Head Non traumatic, neck supple                          Poor Dentition 4. SKIN: decreased Skin turgor,  Skin clean Dry and intact no rash    5. Heart: Regular rate and rhythm no  Murmur, no Rub or gallop 6. Lungs:  Clear to auscultation bilaterally, no wheezes or crackles   7. Abdomen: Soft,  non-tender, Non distended   obese  bowel sounds present 8. Lower extremities: no clubbing, cyanosis, no  edema 9. Neurologically  diminished strengthstrength 3 out of 5 in all 4 extremities  10. MSK: Normal range of motion    Chart has been reviewed  ______________________________________________________________________________________________  Assessment/Plan  37 y.o. female with medical history significant of complex medical history including multiple sclerosis, lupus, pericarditis, pancytopenia   Admitted for AKI, Transaminitis,    Weakness of both lower extremities     Present on Admission:  AKI (acute kidney injury) (HCC)   SLE (systemic lupus erythematosus related syndrome) (HCC)  Normocytic anemia  Obesity, Class I, BMI 30.0-34.9 (see actual BMI)  Multiple sclerosis (HCC)  Elevated liver enzymes  Essential hypertension  Rhabdomyolysis    SLE (systemic lupus erythematosus related syndrome) (HCC) Discussed with nephrology In the past ANA and double-stranded DNA positive also in the past prior Smith positive and low complements.  Will repeat complements level today Current appears to be on Plaquenil and Imuran will continue She finished Prednisone Seems that she was on CellCept in the past but it stopped working Hold off on increasing dose of steroids for right now until seen by nephrology in a.m.  Normocytic anemia Obtain anemia panel transfuse as needed for hemoglobin below 7 hemoglobin appears to be stable  AKI (acute kidney injury) (HCC) Could be potentially secondary to dehydration versus lupus renal disease.  Appreciate nephrology consult.  Rehydrate obtain electrolytes check complement levels ANA and double-stranded DNA At this point no indication for dialysis if renal function does not improved with IV fluids may need biopsy keep n.p.o. postmidnight  Obesity, Class I, BMI 30.0-34.9 (see actual BMI) Need to  be followed up as an outpatient for nutrition  Multiple sclerosis [facility]) ER provider discussed case with neurology.  They feel like her weakness currently is more secondary to medical disease.  If persists despite stabilization would need MRI head and spine with without contrast once canal function improves please consult neurology if symptoms persist when she is being rehydrated and kidney function improves  Elevated liver enzymes Check CK, check hepatitis serologies, Rehydrate, obtain right upper quadrant ultrasound Evaluate if related to lupus flare versus dehydration versus anything else.  If persists will need GI consult   Essential hypertension Allow permissive hypertension for tonight  when stable she has been off her toprol  Rhabdomyolysis CK came back elevated.  At 24000 will rehydrate aggressively and follow fluid status appreciate nephrology consult SLE myositis although rare can also be on the differential    Other plan as per orders.  DVT prophylaxis:  SCD      Code Status:    Code Status: Prior FULL CODE as per patient   I had personally discussed CODE STATUS with patient and family  ACP   none   Family Communication:   Family  at  Bedside  plan of care was discussed   with   Brother   Diet heart healthy npo post midnight    Disposition Plan:     To home once workup is complete and patient is stable   Following barriers for discharge:                                                         Electrolytes corrected                               Anemia corrected h/H stable                                                        Will need consultants to evaluate patient prior to discharge       Consult Orders  (From admission, onward)           Start     Ordered   11/04/22 2129  Consult to hospitalist  Once       Provider:  (Not yet assigned)  Question Answer Comment  Place call to: [facility]   Reason for Consult Admit      11/04/22 2128                               Would benefit from PT/OT eval prior to DC  Ordered                      Consults called:  nephrology is aware will see inAM , Neurology is aware pls reconsult as needed   Admission status:  ED Disposition     ED Disposition  Admit   Condition  --   Comment  Hospital Area: [facility] [[id number]]  Level of Care: Telemetry Medical [[id number]]  May admit patient to [facility] or  [facility] if equivalent level of care is available:: No  Covid Evaluation: Asymptomatic - no recent exposure (last 10 days) testing not required  Diagnosis: AKI (acute kidney injury) [facility] [[id number]]  Admitting Physician: Therisa Doyne [[id number]]  Attending Physician: Therisa Doyne [[id number]]  Certification:: I certify this patient will need inpatient services for at least 2 midnights  Expected Medical Readiness: 11/08/2022             inpatient     I Expect 2 midnight stay secondary to severity of patient's current illness need for inpatient interventions justified by the following:    Severe lab/radiological/exam abnormalities including:    AKI and extensive comorbidities including: Lupus , MS  That are currently affecting medical management.   I expect  patient to be hospitalized for 2 midnights requiring inpatient medical care.  Patient is at high risk for adverse outcome (such as loss of life or disability) if not treated.  Indication for inpatient stay as follows:    Need for operative/procedural  intervention    Need for   IV fluids,    Level of care     tele  For  24H       Ivette Castronova 11/05/2022, 12:01 AM    [facility]     after 2 AM please page floor coverage PA If 7AM-7PM, please contact the day team taking care of the patient using [email or web address]

## 2022-11-05 ENCOUNTER — Inpatient Hospital Stay (HOSPITAL_COMMUNITY): Payer: 59

## 2022-11-05 DIAGNOSIS — N179 Acute kidney failure, unspecified: Secondary | ICD-10-CM | POA: Diagnosis not present

## 2022-11-05 DIAGNOSIS — M609 Myositis, unspecified: Secondary | ICD-10-CM

## 2022-11-05 DIAGNOSIS — D649 Anemia, unspecified: Secondary | ICD-10-CM | POA: Diagnosis not present

## 2022-11-05 DIAGNOSIS — R748 Abnormal levels of other serum enzymes: Secondary | ICD-10-CM | POA: Diagnosis not present

## 2022-11-05 DIAGNOSIS — I10 Essential (primary) hypertension: Secondary | ICD-10-CM | POA: Diagnosis not present

## 2022-11-05 DIAGNOSIS — M6282 Rhabdomyolysis: Secondary | ICD-10-CM | POA: Diagnosis present

## 2022-11-05 LAB — CBC
HCT: 29.7 % — ABNORMAL LOW (ref 36.0–46.0)
Hemoglobin: 9.4 g/dL — ABNORMAL LOW (ref 12.0–15.0)
MCH: 26.3 pg (ref 26.0–34.0)
MCHC: 31.6 g/dL (ref 30.0–36.0)
MCV: 83 fL (ref 80.0–100.0)
Platelets: 189 10*3/uL (ref 150–400)
RBC: 3.58 MIL/uL — ABNORMAL LOW (ref 3.87–5.11)
RDW: 13.9 % (ref 11.5–15.5)
WBC: 2.7 10*3/uL — ABNORMAL LOW (ref 4.0–10.5)
nRBC: 0 % (ref 0.0–0.2)

## 2022-11-05 LAB — COMPREHENSIVE METABOLIC PANEL
ALT: 88 U/L — ABNORMAL HIGH (ref 0–44)
AST: 317 U/L — ABNORMAL HIGH (ref 15–41)
Albumin: 2.2 g/dL — ABNORMAL LOW (ref 3.5–5.0)
Alkaline Phosphatase: 35 U/L — ABNORMAL LOW (ref 38–126)
Anion gap: 8 (ref 5–15)
BUN: 49 mg/dL — ABNORMAL HIGH (ref 6–20)
CO2: 20 mmol/L — ABNORMAL LOW (ref 22–32)
Calcium: 8.2 mg/dL — ABNORMAL LOW (ref 8.9–10.3)
Chloride: 110 mmol/L (ref 98–111)
Creatinine, Ser: 1.82 mg/dL — ABNORMAL HIGH (ref 0.44–1.00)
GFR, Estimated: 36 mL/min — ABNORMAL LOW (ref >60)
Glucose, Bld: 105 mg/dL — ABNORMAL HIGH (ref 70–99)
Potassium: 3.4 mmol/L — ABNORMAL LOW (ref 3.5–5.1)
Sodium: 138 mmol/L (ref 135–145)
Total Bilirubin: 0.3 mg/dL (ref 0.3–1.2)
Total Protein: 5.9 g/dL — ABNORMAL LOW (ref 6.5–8.1)

## 2022-11-05 LAB — SODIUM, URINE, RANDOM: Sodium, Ur: 30 mmol/L

## 2022-11-05 LAB — HEPATITIS PANEL, ACUTE
HCV Ab: NONREACTIVE
Hep A IgM: NONREACTIVE
Hep B C IgM: NONREACTIVE
Hepatitis B Surface Ag: NONREACTIVE

## 2022-11-05 LAB — URINALYSIS, COMPLETE (UACMP) WITH MICROSCOPIC
Bilirubin Urine: NEGATIVE
Glucose, UA: NEGATIVE mg/dL
Ketones, ur: NEGATIVE mg/dL
Leukocytes,Ua: NEGATIVE
Nitrite: NEGATIVE
Protein, ur: 30 mg/dL — AB
Specific Gravity, Urine: 1.017 (ref 1.005–1.030)
pH: 6 (ref 5.0–8.0)

## 2022-11-05 LAB — C-REACTIVE PROTEIN
CRP: 7.3 mg/dL — ABNORMAL HIGH (ref <1.0)
CRP: 5.2 mg/dL — ABNORMAL HIGH (ref <1.0)

## 2022-11-05 LAB — FOLATE: Folate: 9.6 ng/mL (ref >5.9)

## 2022-11-05 LAB — RAPID URINE DRUG SCREEN, HOSP PERFORMED
Amphetamines: NOT DETECTED
Barbiturates: NOT DETECTED
Benzodiazepines: NOT DETECTED
Cocaine: NOT DETECTED
Opiates: NOT DETECTED
Tetrahydrocannabinol: NOT DETECTED

## 2022-11-05 LAB — FERRITIN: Ferritin: 721 ng/mL — ABNORMAL HIGH (ref 11–307)

## 2022-11-05 LAB — IRON AND TIBC
Iron: 24 ug/dL — ABNORMAL LOW (ref 28–170)
Saturation Ratios: 11 % (ref 10.4–31.8)
TIBC: 210 ug/dL — ABNORMAL LOW (ref 250–450)
UIBC: 186 ug/dL

## 2022-11-05 LAB — CK
Total CK: 23975 U/L — ABNORMAL HIGH (ref 38–234)
Total CK: 49442 U/L — ABNORMAL HIGH (ref 38–234)
Total CK: 10899 U/L — ABNORMAL HIGH (ref 38–234)
Total CK: 10783 U/L — ABNORMAL HIGH (ref 38–234)

## 2022-11-05 LAB — PREALBUMIN: Prealbumin: 6 mg/dL — ABNORMAL LOW (ref 18–38)

## 2022-11-05 LAB — CREATININE, URINE, RANDOM: Creatinine, Urine: 159 mg/dL

## 2022-11-05 LAB — SEDIMENTATION RATE: Sed Rate: 90 mm/h — ABNORMAL HIGH (ref 0–22)

## 2022-11-05 LAB — MAGNESIUM: Magnesium: 2.1 mg/dL (ref 1.7–2.4)

## 2022-11-05 LAB — OSMOLALITY, URINE: Osmolality, Ur: 597 mosm/kg (ref 300–900)

## 2022-11-05 LAB — OSMOLALITY: Osmolality: 316 mosm/kg — ABNORMAL HIGH (ref 275–295)

## 2022-11-05 LAB — VITAMIN B12: Vitamin B-12: 309 pg/mL (ref 180–914)

## 2022-11-05 LAB — PHOSPHORUS: Phosphorus: 3.5 mg/dL (ref 2.5–4.6)

## 2022-11-05 MED ORDER — ADULT MULTIVITAMIN W/MINERALS CH
1.0000 | ORAL_TABLET | Freq: Every day | ORAL | Status: DC
  Administered 2022-11-06 – 2022-11-09 (×4): 1 via ORAL
  Filled 2022-11-05 (×4): qty 1

## 2022-11-05 MED ORDER — SODIUM CHLORIDE 0.9 % IV BOLUS
1000.0000 mL | Freq: Once | INTRAVENOUS | Status: AC
  Administered 2022-11-05: 1000 mL via INTRAVENOUS

## 2022-11-05 MED ORDER — SODIUM CHLORIDE 0.9 % IV SOLN: INTRAVENOUS | Status: DC

## 2022-11-05 MED ORDER — ENSURE ENLIVE PO LIQD
237.0000 mL | Freq: Two times a day (BID) | ORAL | Status: DC
  Administered 2022-11-06 – 2022-11-09 (×4): 237 mL via ORAL

## 2022-11-05 MED ORDER — SODIUM CHLORIDE 0.9 % IV BOLUS
2000.0000 mL | Freq: Once | INTRAVENOUS | Status: AC
  Administered 2022-11-05: 2000 mL via INTRAVENOUS

## 2022-11-05 MED ORDER — LORAZEPAM 2 MG/ML IJ SOLN
1.0000 mg | Freq: Once | INTRAMUSCULAR | Status: AC | PRN
  Administered 2022-11-05: 1 mg via INTRAVENOUS
  Filled 2022-11-05: qty 1

## 2022-11-05 MED ORDER — POTASSIUM CHLORIDE CRYS ER 20 MEQ PO TBCR
40.0000 meq | EXTENDED_RELEASE_TABLET | ORAL | Status: AC
  Administered 2022-11-05 (×2): 40 meq via ORAL
  Filled 2022-11-05 (×2): qty 2

## 2022-11-05 NOTE — Evaluation (Signed)
Physical Therapy Evaluation Patient Details Name: Emily Hicks MRN: [id number] DOB: 1985-12-12 Today's Date: 11/05/2022  History of Present Illness  Pt is 37 yo presenting with generalized weakness and worsening leg strength over the past few weeks. PMH: Lupus, MS  Clinical Impression  Pt is presenting below baseline level of functioning. Prior to current exacerbation pt was Ind with activities. Currently Pt is Min A for sitting to supine and CGA for sit to stand/gait. Due to pt current functional status, home set up and co-morbidities currently no recommended skilled physical therapy services at this time on discharge from acute care hospital setting. Will continue to follow in acute care setting in order to decrease risk for falls, injury and re-hospitalization to return pt to Mod I-Ind level of functioning. Pt tolerated treatment session well; ended early due to transport arrived to take pt to MRI.      If plan is discharge home, recommend the following: Help with stairs or ramp for entrance;Assist for transportation;Assistance with cooking/housework     Equipment Recommendations None recommended by PT     Functional Status Assessment Patient has had a recent decline in their functional status and demonstrates the ability to make significant improvements in function in a reasonable and predictable amount of time.     Precautions / Restrictions Precautions Precautions: Fall Restrictions Weight Bearing Restrictions: No      Mobility  Bed Mobility Overal bed mobility: Needs Assistance Bed Mobility: Supine to Sit, Sit to Supine     Supine to sit: Modified independent (Device/Increase time) Sit to supine: Min assist   General bed mobility comments: Min A with the bil LE to get from sitting to supine    Transfers Overall transfer level: Needs assistance Equipment used: Rolling walker (2 wheels) Transfers: Sit to/from Stand Sit to Stand: Contact guard assist            General transfer comment: CGA for safety for stability    Ambulation/Gait Ambulation/Gait assistance: Contact guard assist Gait Distance (Feet): 150 Feet Assistive device: Rolling walker (2 wheels) Gait Pattern/deviations: Step-through pattern, Decreased step length - right, Decreased step length - left, Decreased stride length Gait velocity: decreased Gait velocity interpretation: 1.31 - 2.62 ft/sec, indicative of limited community ambulator   General Gait Details: Very slight decrease in stance time on the L compared to the R. Pt states the L feels weaker than the R. CGA for stability.  Stairs Stairs:  (not performed today. Overall strength wtih sit to stand and gait demonstrates pt is able to navigate stairs per home set up with assistance from family)              Balance Overall balance assessment: No apparent balance deficits (not formally assessed)       Pertinent Vitals/Pain Pain Assessment Pain Assessment: No/denies pain    Home Living Family/patient expects to be discharged to:: Private residence Living Arrangements: Children;Spouse/significant other Available Help at Discharge: Family;Available PRN/intermittently Type of Home: House Home Access: Stairs to enter Entrance Stairs-Rails: None (has a post she uses) Secretary/administrator of Steps: 2   Home Layout: One level Home Equipment: Rollator (4 wheels);BSC/3in1;Shower seat      Prior Function Prior Level of Function : Independent/Modified Independent;Driving;Working/employed             Mobility Comments: normally no use of AD. For the past couple of weeks has used tehr [employer] ADLs Comments: Overall prior to feeling poor over the past couple of weeks pt is ind  with dressing, grooming, bathing and toileting.     Extremity/Trunk Assessment   Upper Extremity Assessment Upper Extremity Assessment: Generalized weakness    Lower Extremity Assessment Lower Extremity Assessment:  Generalized weakness    Cervical / Trunk Assessment Cervical / Trunk Assessment: Normal  Communication   Communication Communication: No apparent difficulties  Cognition Arousal: Alert Behavior During Therapy: WFL for tasks assessed/performed Overall Cognitive Status: Within Functional Limits for tasks assessed        General Comments General comments (skin integrity, edema, etc.): Warm, dry and intact        Assessment/Plan    PT Assessment Patient needs continued PT services  PT Problem List Decreased strength;Decreased mobility       PT Treatment Interventions DME instruction;Therapeutic exercise;Stair training;Gait training;Balance training;Functional mobility training;Therapeutic activities;Patient/family education    PT Goals (Current goals can be found in the Care Plan section)  Acute Rehab PT Goals Patient Stated Goal: return home with family PT Goal Formulation: With patient Time For Goal Achievement: 11/19/22 Potential to Achieve Goals: Good    Frequency Min 1X/week        AM-PAC PT "6 Clicks" Mobility  Outcome Measure Help needed turning from your back to your side while in a flat bed without using bedrails?: None Help needed moving from lying on your back to sitting on the side of a flat bed without using bedrails?: None Help needed moving to and from a bed to a chair (including a wheelchair)?: A Little Help needed standing up from a chair using your arms (e.g., wheelchair or bedside chair)?: A Little Help needed to walk in hospital room?: A Little Help needed climbing 3-5 steps with a railing? : A Little 6 Click Score: 20    End of Session Equipment Utilized During Treatment: Gait belt Activity Tolerance: Patient tolerated treatment well Patient left: in bed;with family/visitor present;with nursing/sitter in room;with call bell/phone within reach (transport arrived to take pt to MRI) Nurse Communication: Mobility status PT Visit Diagnosis: Other  abnormalities of gait and mobility (R26.89);Muscle weakness (generalized) (M62.81)    Time: 5366-4403 PT Time Calculation (min) (ACUTE ONLY): 16 min   Charges:   PT Evaluation $PT Eval Low Complexity: 1 Low   PT General Charges $$ ACUTE PT VISIT: 1 Visit       Harrel Carina, DPT, CLT  Acute Rehabilitation Services Office: [phone] (Secure chat preferred)   Claudia Desanctis 11/05/2022, 2:15 PM

## 2022-11-05 NOTE — Progress Notes (Signed)
Patient is back on the floor at 1635 via bed after MRI. Alert and oriented. Will continue to monitor

## 2022-11-05 NOTE — Progress Notes (Signed)
SCD placed per odrer

## 2022-11-05 NOTE — Assessment & Plan Note (Addendum)
CK came back elevated.  At 24000 will rehydrate aggressively and follow fluid status appreciate nephrology consult SLE myositis although rare can also be on the differential

## 2022-11-05 NOTE — Progress Notes (Signed)
OT Cancellation Note  Patient Details Name: Emily Hicks MRN: [id number] DOB: Nov 19, 1985   Cancelled Treatment:    Reason Eval/Treat Not Completed: Patient at procedure or test/ unavailable (attempted x 2, pt with another discipline or at MRI). Will continue to follow.   Evern Bio 11/05/2022, 3:43 PM Berna Spare, OTR/L Acute Rehabilitation Services Office: [phone]

## 2022-11-05 NOTE — Progress Notes (Signed)
Initial Nutrition Assessment  DOCUMENTATION CODES:   Obesity unspecified  INTERVENTION:  Liberalize diet to regular to remove restrictions and encourage intake.  Discussed importance of adequate intake at meals. Encouraged pt to choose a good source of protein at meals.  Provide Ensure Enlive po BID, each supplement provides 350 kcal and 20 grams of protein.  Provide multivitamin with minerals po daily.  Recommend obtaining measured weight to assess weight trends.  NUTRITION DIAGNOSIS:   Inadequate oral intake related to poor appetite as evidenced by per patient/family report.  GOAL:   Patient will meet greater than or equal to 90% of their needs  MONITOR:   PO intake, Supplement acceptance, Labs, Weight trends, I & O's  REASON FOR ASSESSMENT:   Consult (assess nutritional status)    ASSESSMENT:   37 year old female with PMHx of MS, lupus, pericarditis, pancytopenia admitted with generalized and lower extremity weakness and pain, nontraumatic rhabdomyolysis, possible flare of SLE, AKI.  Plan per chart is for muscle biopsy.  Met with pt and sister at bedside after pt returned from MRI. [name]. Sister reports pt has had absence of hunger (anorexia). She has attempted to eat at meals but may only have small amount of chicken, rice, corn once or twice daily. Typically eats well at baseline. Sister reports that over the summer when pt was on steroids she was eating more than typical and had weight gain. Pt was not able to eat well at breakfast this morning. She had part of her breakfast sausage and then only about 25% of her omelet. Denies food allergies or intolerances. Reports had some nausea the past week, especially when trying to drink water. Denies abdominal pain or difficulty chewing/swallowing. Discussed importance of adequate intake. Encouraged intake of small, frequent meals in setting of decreased appetite/lack of hunger. Pt  agreeable to drink Ensure supplements to help meet calorie and protein needs with poor appetite.  Sister reports pt had a history of significant weight loss in 2023 and lost down to 170 lbs (77.3 kg). Pt then had weight gain throughout the summer on steroids and now has weight higher than before she had previously lost weight. Current documented weight is 93.4 kg (206 lbs), but unsure if this was a true measured weight.  Medications reviewed and include: gabapentin  Labs reviewed: Potassium 3.4, CO2 20, BUN 49, Creatinine 1.82  UOP: 1 occurrence unmeasured UOP in previous 24 hrs  I/O: +1000 mL since admission  NUTRITION - FOCUSED PHYSICAL EXAM:  Flowsheet Row Most Recent Value  Orbital Region No depletion  Upper Arm Region No depletion  Thoracic and Lumbar Region No depletion  Buccal Region No depletion  Temple Region No depletion  Clavicle Bone Region No depletion  Clavicle and Acromion Bone Region No depletion  Scapular Bone Region No depletion  Dorsal Hand No depletion  Patellar Region No depletion  Anterior Thigh Region No depletion  Posterior Calf Region No depletion  Edema (RD Assessment) None  Hair Reviewed  Eyes Reviewed  Mouth Reviewed  Skin Reviewed  Nails Reviewed       Diet Order:   Diet Order             Diet NPO time specified Except for: Sips with Meds  Diet effective midnight           Diet Heart Room service appropriate? Yes; Fluid consistency: Thin  Diet effective now  EDUCATION NEEDS:   No education needs have been identified at this time  Skin:  Skin Assessment: Reviewed RN Assessment  Last BM:  11/28/22  Height:   Ht Readings from Last 1 Encounters:  11/04/22 5\' 1"  (1.549 m)   Weight:   Wt Readings from Last 1 Encounters:  11/04/22 93.4 kg   BMI:  Body mass index is 38.92 kg/m.  Estimated Nutritional Needs:   Kcal:  2100-2300  Protein:  105-115 grams  Fluid:  2.1-2.3 L/day  Letta Median, MS, RD,  LDN, CNSC Pager number available on Amion

## 2022-11-05 NOTE — Plan of Care (Signed)

## 2022-11-05 NOTE — Consult Note (Addendum)
Neurology Consultation  Reason for Consult: Bilateral lower extremity weakness Referring Physician: Dr. Alanda Slim  CC: Bilateral lower extremity weakness along with some upper extremity weakness  History is obtained from: Patient and chart  HPI: Emily Hicks is a 37 y.o. female with a history of multiple sclerosis, lupus, pericarditis and pancytopenia who presents with upper and lower extremity weakness which is worsening over the last few weeks. She also has experienced what she perceives as some mild facial weakness with nasal speech that is not normal for her. Patient states that in May, she developed some lower extremity weakness and needed to walk with a walker.  She did see Dr. Epimenio Foot of Neurology at [facility] around that time, and he felt that this was due to an MS flare.  However, MRI of brain and spine performed in July with and without contrast demonstrated no new enhancing lesions.  Patient states that for a few months the weakness persisted but that in August it began to get better and she was able to ambulate without a walker.  However, in September, the lower extremity weakness returned and patient returned to ambulating with a walker.  She states she has not had any numbness but that the sensation on her left side feels a little different than the right; this is baseline and has been present for years.  She has not had any recent falls or periods of immobility.  She states that since May, she has had some problems with urinary urgency and urge incontinence, for which she was started on Flomax.  About a week ago, she had some diarrhea with bowel incontinence, but this has resolved.  Patient denies any problems with breathing, chewing or swallowing. She has not had any pain in her muscles and no rash.   ROS: A complete ROS was performed and is negative except as noted in the HPI.   Past Medical History:  Diagnosis Date   Cervical intraepithelial neoplasia III    Gallstones    GERD  (gastroesophageal reflux disease)    only prn OTC occasionally   History of blood transfusion 2020   Lupus dx 2020   Multiple sclerosis (HCC) dx 2020   Numbness and tingling of left leg      Family History  Problem Relation Age of Onset   Hypertension Father    Hypertension Mother    Hypertension Sister    Diabetes Maternal Grandmother    Diabetes Maternal Grandfather      Social History:   reports that she has never smoked. She has never used smokeless tobacco. She reports that she does not drink alcohol and does not use drugs.  Medications  Current Facility-Administered Medications:    [COMPLETED] sodium chloride 0.9 % bolus 2,000 mL, 2,000 mL, Intravenous, Once, Last Rate: 500 mL/hr at 11/05/22 0802, 2,000 mL at 11/05/22 0802 **FOLLOWED BY** 0.9 %  sodium chloride infusion, , Intravenous, Continuous, Gonfa, Taye T, MD, Last Rate: 250 mL/hr at 11/05/22 0900, New Bag at 11/05/22 0900   azaTHIOprine (IMURAN) tablet 150 mg, 150 mg, Oral, Daily, Doutova, Anastassia, MD, 150 mg at 11/05/22 0955   gabapentin (NEURONTIN) capsule 300 mg, 300 mg, Oral, TID, Doutova, Anastassia, MD, 300 mg at 11/05/22 0955   hydroxychloroquine (PLAQUENIL) tablet 200 mg, 200 mg, Oral, BID, Doutova, Anastassia, MD, 200 mg at 11/05/22 0955   ondansetron (ZOFRAN) tablet 4 mg, 4 mg, Oral, Q6H PRN **OR** ondansetron (ZOFRAN) injection 4 mg, 4 mg, Intravenous, Q6H PRN, Therisa Doyne, MD  Exam: Current vital signs: BP 109/78 (BP Location: Right Arm)   Pulse 96   Temp (!) 97.4 F (36.3 C) (Oral)   Resp 17   Ht 5\' 1"  (1.549 m)   Wt 93.4 kg   SpO2 99%   BMI 38.92 kg/m  Vital signs in last 24 hours: Temp:  [97.4 F (36.3 C)-98.7 F (37.1 C)] 97.4 F (36.3 C) (10/02 0733) Pulse Rate:  [84-104] 96 (10/02 1130) Resp:  [14-17] 17 (10/02 0733) BP: (101-129)/(66-83) 109/78 (10/02 1130) SpO2:  [96 %-100 %] 99 % (10/02 1130) Weight:  [93.4 kg] 93.4 kg (10/01 1750)  GENERAL: Awake, alert, in no acute  distress Psych: Affect appropriate for situation, patient is calm and cooperative with examination Head: Normocephalic and atraumatic, without obvious abnormality EENT: Normal conjunctivae, moist mucous membranes, no OP obstruction LUNGS: Normal respiratory effort. Non-labored breathing on room air Extremities: warm, well perfused, without obvious deformity  NEURO:  Mental Status: Awake, alert, and oriented to person, place, time, and situation. She is able to provide a clear and coherent history of present illness. Speech/Language: speech is clear and fluent, without dysarthria, but with a nasal quality. Comprehension intact.  No neglect is noted Cranial Nerves:  II: PERRL III, IV, VI: EOMI. Lid elevation symmetric and full without ptosis. No double vision. No nystagmus.  V: Sensation is intact to light touch but slightly diminished on the left VII: Face is symmetric resting and smiling. Able to puff cheeks and raise eyebrows. There is mild diffuse weakness of her muscles of facial expression.  VIII: Hearing intact to voice IX, X: Phonation without hoarseness or hypophonia, but nasal quality is noted.  XI: Normal sternocleidomastoid and trapezius muscle strength XII: Tongue protrudes midline without fasciculations. No lingual dysarthria.  Motor: 4/5 strength proximally to bilateral upper extremities, 4-/5 distally.  4/5 strength to bilateral lower extremities, proximal and distal. No muscle tenderness.  Tone is normal. Bulk is normal.  Sensation: Intact to light touch bilaterally in all four extremities but slightly different on the left as per patient's baseline Coordination: FTN intact bilaterally.  No pronator drift.  DTRs: 2+ throughout.  Gait: Deferred  Labs I have reviewed labs in epic and the results pertinent to this consultation are:  CBC    Component Value Date/Time   WBC 2.7 (L) 11/05/2022 0534   RBC 3.58 (L) 11/05/2022 0534   HGB 9.4 (L) 11/05/2022 0534   HGB 11.9  08/15/2021 1451   HCT 29.7 (L) 11/05/2022 0534   HCT 37.7 08/15/2021 1451   PLT 189 11/05/2022 0534   PLT 292 08/15/2021 1451   MCV 83.0 11/05/2022 0534   MCV 83 08/15/2021 1451   MCH 26.3 11/05/2022 0534   MCHC 31.6 11/05/2022 0534   RDW 13.9 11/05/2022 0534   RDW 13.7 08/15/2021 1451   LYMPHSABS 0.8 06/16/2021 0235   LYMPHSABS 0.9 05/11/2018 1524   MONOABS 0.6 06/16/2021 0235   EOSABS 0.0 06/16/2021 0235   EOSABS 0.0 05/11/2018 1524   BASOSABS 0.0 06/16/2021 0235   BASOSABS 0.0 05/11/2018 1524    CMP     Component Value Date/Time   NA 138 11/05/2022 0534   NA 143 09/29/2019 1035   K 3.4 (L) 11/05/2022 0534   CL 110 11/05/2022 0534   CO2 20 (L) 11/05/2022 0534   GLUCOSE 105 (H) 11/05/2022 0534   BUN 49 (H) 11/05/2022 0534   BUN 7 09/29/2019 1035   CREATININE 1.82 (H) 11/05/2022 0534   CREATININE 0.71 10/29/2010 0934  CALCIUM 8.2 (L) 11/05/2022 0534   PROT 5.9 (L) 11/05/2022 0534   PROT 6.7 09/29/2019 1035   ALBUMIN 2.2 (L) 11/05/2022 0534   ALBUMIN 4.2 09/29/2019 1035   ALBUMIN 4.0 04/16/2018 1225   AST 317 (H) 11/05/2022 0534   ALT 88 (H) 11/05/2022 0534   ALKPHOS 35 (L) 11/05/2022 0534   BILITOT 0.3 11/05/2022 0534   BILITOT 0.6 09/29/2019 1035   GFRNONAA 36 (L) 11/05/2022 0534   GFRAA 127 09/29/2019 1035    Lipid Panel     Component Value Date/Time   CHOL 126 08/15/2021 1451   TRIG 93 08/15/2021 1451   HDL 49 08/15/2021 1451   CHOLHDL 2.6 08/15/2021 1451   CHOLHDL 2.5 12/20/2015 1047   VLDL 14 12/20/2015 1047   LDLCALC 59 08/15/2021 1451   CK initially 23,975, now 49,442  Imaging I have reviewed the images obtained:  MRI examination of the pelvis and thighs: Pending  Assessment: 37 year old female with a history of lupus, multiple sclerosis and pericarditis presents with about a 1 month history of lower extremity weakness as well as acute kidney injury in the setting of severely elevated CK level suggestive of muscle breakdown/inflammation.   Weakness is symmetrical, and she verbalizes some weakness also in upper extremities, worse distally than proximally.  - On exam proximal and distal weakness of the BUE and BLE is confirmed, in addition to mild weakness of facial musculature, mild neck weakness in flexion and nasal speech. No muscle tenderness is present and reflexes are preserved.  - Labs: - CK was noted to be quite elevated on admission at 23,975, increased to 49,442 this morning but down to the 10,000's with blood draws this afternoon and evening.  - LFTs are elevated with AST 391 and ALT 105. - ESR markedly elevated at 105, consistent with an inflammatory process. CRP also elevated at 7.3 - Na normal. Ca mildly low, but in the context of low albumin.  - Elevated BUN and Cr with eGFR of 36 - Mg normal - Mild hypokalemia at 3.4, but was normal on admission yesterday.  - DDx: - Suspect that this is a myositis related to lupus. No rash to suggest dermatomyositis.  - MS exacerbation is low on the DDx.   - While patient has proximal weakness at least in the upper extremities, reflexes are brisk making GBS much less likely.   - Will obtain MRI of the pelvis and thighs to evaluate musculature for a specific myositis pattern and following that we will obtain muscle biopsy.   - May at some point wish to obtain MRI of brain and spine with and without contrast, although MS exacerbation is less likely given symmetrical nature of weakness and elevated CK.  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 Note/plan to be edited by MD as needed.  Cortney E Ernestina Columbia , MSN, AGACNP-BC [facility] See Amion for schedule and pager information 11/05/2022 12:31 PM   I have seen and examined the patient. I have formulated the assessment and recommendations. 37 year old female presenting with diffuse weakness, most  likely secondary to myositis. Exam reveals 4/5 weakness x 4, in addition to mild bulbar and neck weakness. Reflexes preserved. Sensation mildly abnormal but at baseline in the context of her history of MS. Recommendations as above.  Electronically signed: Dr. Caryl Pina

## 2022-11-05 NOTE — Consult Note (Addendum)
[facility] Resident Consultation Note  Requesting MD: Almon Hercules, MD Indication for Consultation:  AKI  Chief complaint: Generalized weakness  HPI:  Emily Hicks is a 37 y.o. female with history of lupus and multiple sclerosis admitted for generalized weakness, found to have rhabdomyolysis with AKI.  She was admitted in May 2024 for lupus flare, treated with steroid taper and was doing pretty well. Had returned to work, symptoms were pretty well-controlled. Within the last few weeks however she started to have worsening fatigue, describing symptoms as severe and thus she has spent most of the last week or so in bed. She has also had leg muscle weakness. Her family encouraged her to go the hospital for care for these new symptoms. She reports some mild muscle aches. No recent falls with prolonged down-time. No seizure history noted.  Family history notable for lupus in maternal cousins.  Workup thus far notable for CK of ~20,000 > ~50,000. She has AKI with creatine trend per below, improving with aggressive IV fluid hydration.  Creatinine  Date/Time Value Ref Range Status  05/03/2010 06:20 PM 0.55 0.40 - 1.20 mg/dL Final   Creat  Date/Time Value Ref Range Status  10/29/2010 09:34 AM 0.71 0.50 - 1.10 mg/dL Final  16/11/9602 54:09 PM 0.69 0.40 - 1.20 mg/dL Final  81/19/1478 29:56 PM 0.56 0.40 - 1.20 mg/dL Final   Creatinine, Ser  Date/Time Value Ref Range Status  11/05/2022 05:34 AM 1.82 (H) 0.44 - 1.00 mg/dL Final  21/30/8657 84:69 PM 2.24 (H) 0.44 - 1.00 mg/dL Final  62/95/2841 32:44 AM 0.65 0.44 - 1.00 mg/dL Final  02/05/7251 66:44 PM 0.74 0.44 - 1.00 mg/dL Final  03/47/4259 56:38 PM 0.79 0.44 - 1.00 mg/dL Final  75/64/3329 51:88 AM 0.76 0.44 - 1.00 mg/dL Final  41/66/0630 16:01 AM 0.50 0.44 - 1.00 mg/dL Final  09/32/3557 32:20 AM 0.63 0.44 - 1.00 mg/dL Final  25/42/7062 37:62 AM 0.63 0.44 - 1.00 mg/dL Final  83/15/1761 60:73 PM 0.62 0.44 - 1.00 mg/dL Final   71/07/2692 85:46 PM 0.66 0.44 - 1.00 mg/dL Final  27/04/5007 38:18 PM 0.51 0.44 - 1.00 mg/dL Final  29/93/7169 67:89 PM 0.61 0.44 - 1.00 mg/dL Final  38/11/1749 02:58 AM 0.50 0.44 - 1.00 mg/dL Final  52/77/8242 35:36 AM 0.72 0.57 - 1.00 mg/dL Final  14/43/1540 08:67 AM 0.65 0.57 - 1.00 mg/dL Final  61/95/0932 67:12 AM 0.60 0.57 - 1.00 mg/dL Final  45/80/9983 38:25 AM 0.75 0.57 - 1.00 mg/dL Final  05/39/7673 41:93 AM 1.06 (H) 0.44 - 1.00 mg/dL Final  79/03/4095 35:32 AM 0.85 0.44 - 1.00 mg/dL Final  99/24/2683 41:96 AM 0.92 0.44 - 1.00 mg/dL Final  22/29/7989 21:19 AM 0.92 0.44 - 1.00 mg/dL Final  41/74/0814 48:18 AM 1.09 (H) 0.44 - 1.00 mg/dL Final  56/31/4970 26:37 PM 1.06 (H) 0.44 - 1.00 mg/dL Final  85/88/5027 74:12 AM 1.08 (H) 0.44 - 1.00 mg/dL Final  87/86/7672 09:47 AM 0.84 0.44 - 1.00 mg/dL Final  09/62/8366 29:47 AM 0.63 0.44 - 1.00 mg/dL Final  65/46/5035 46:56 AM 0.51 0.44 - 1.00 mg/dL Final  81/27/5170 01:74 PM 0.49 0.44 - 1.00 mg/dL Final  94/49/6759 16:38 AM 0.44 0.44 - 1.00 mg/dL Final  46/65/9935 70:17 AM 0.57 0.44 - 1.00 mg/dL Final  79/39/0300 92:33 AM 0.57 0.44 - 1.00 mg/dL Final  00/76/2263 33:54 AM 0.62 0.44 - 1.00 mg/dL Final  56/25/6389 37:34 AM 0.69 0.44 - 1.00 mg/dL Final  28/76/8115 72:62 PM 0.69 0.44 -  1.00 mg/dL Final  82/95/6213 08:65 AM 0.69 0.44 - 1.00 mg/dL Final  78/46/9629 52:84 PM 0.80 0.44 - 1.00 mg/dL Final  13/24/4010 27:25 PM 0.98 0.44 - 1.00 mg/dL Final  36/64/4034 74:25 AM 0.90 0.44 - 1.00 mg/dL Final  95/63/8756 43:32 PM 0.94 0.44 - 1.00 mg/dL Final  95/18/8416 60:63 PM 1.21 (H) 0.44 - 1.00 mg/dL Final  01/60/1093 23:55 PM 0.83 0.44 - 1.00 mg/dL Final  73/22/0254 27:06 PM 0.64 0.57 - 1.00 mg/dL Final  23/76/2831 51:76 PM 0.64 0.57 - 1.00 mg/dL Final  16/08/3708 62:69 PM 0.54 (L) 0.57 - 1.00 mg/dL Final  48/54/6270 35:00 PM 0.68 0.57 - 1.00 mg/dL Final  93/81/8299 37:16 PM 0.70 0.57 - 1.00 mg/dL Final  96/78/9381 01:75 AM 0.71 0.50 - 1.10  mg/dL Final  11/27/8525 78:24 AM 0.70 0.50 - 1.10 mg/dL Final  23/53/6144 31:54 PM 0.59 0.4 - 1.2 mg/dL Final  00/86/7619 50:93 PM 0.55 0.4 - 1.2 mg/dL Final  26/71/2458 09:98 PM 0.64 0.40 - 1.20 mg/dL Final     PMHx: Past Medical History:  Diagnosis Date   Cervical intraepithelial neoplasia III    Gallstones    GERD (gastroesophageal reflux disease)    only prn OTC occasionally   History of blood transfusion 2020   Lupus dx 2020   Multiple sclerosis (HCC) dx 2020   Numbness and tingling of left leg     Past Surgical History:  Procedure Laterality Date   CERVICAL CONIZATION W/BX N/A 01/16/2020   Procedure: CERVICAL CONE BIOPSY, ENDOCERVICAL CURETTAGE;  Surgeon: Theresia Majors, MD;  Location: [facility];  Service: Gynecology;  Laterality: N/A;  request 7:30am OR start in [place] [facility] block requests 45 minutes   CHOLECYSTECTOMY N/A 12/28/2013   Procedure: LAPAROSCOPIC CHOLECYSTECTOMY;  Surgeon: Claud Kelp, MD;  Location: [facility];  Service: General;  Laterality: N/A;    Family Hx:  Family History  Problem Relation Age of Onset   Hypertension Father    Hypertension Mother    Hypertension Sister    Diabetes Maternal Grandmother    Diabetes Maternal Grandfather   Cousin with lupus and a cousin with MS - both on her mother's side   Social History:  reports that she has never smoked. She has never used smokeless tobacco. She reports that she does not drink alcohol and does not use drugs.  Allergies: No Known Allergies  Medications: Prior to Admission medications   Medication Sig Start Date End Date Taking? Authorizing Provider  azathioprine (IMURAN) 100 MG tablet Take 150 mg by mouth daily.   Yes [provider]  clobetasol ointment (TEMOVATE) 0.05 % Apply 1 application  topically 2 (two) times daily as needed (as directed). 06/04/21  Yes [provider]  EXCEDRIN EXTRA STRENGTH [phone] MG tablet Take 1 tablet by mouth every 6 (six)  hours as needed for headache or migraine.   Yes [provider]  gabapentin (NEURONTIN) 300 MG capsule Take 1 capsule (300 mg total) by mouth 3 (three) times daily. 08/20/22  Yes Sater, Pearletha Furl, MD  hydroxychloroquine (PLAQUENIL) 200 MG tablet Take 200 mg by mouth 2 (two) times daily.   Yes [provider]  ibuprofen (ADVIL) 200 MG tablet Take 200-400 mg by mouth every 6 (six) hours as needed for mild pain (or headaches).   Yes [provider]  ketoconazole (NIZORAL) 2 % shampoo Apply 1 Application topically 2 (two) times a week.   Yes [provider]  TYLENOL 325 MG CAPS Take [phone]  mg by mouth every 6 (six) hours as needed (for headaches or pain).   Yes [provider]  metoprolol succinate (TOPROL XL) 25 MG 24 hr tablet Take 1 tablet (25 mg total) by mouth at bedtime. Patient not taking: Reported on 11/04/2022 08/04/22   Riley Lam A, MD  predniSONE (DELTASONE) 10 MG tablet Take 10-40 mg by mouth See admin instructions. Take 40 mg WITH FOOD by mouth once daily for 7 days then 30 mg once daily for 7 days then 20 mg once daily for 7 days then 10 mg once a day thereafter OR AS OTHERWISE INSTRUCTED Patient not taking: Reported on 11/04/2022 06/02/22   [provider]  tamsulosin (FLOMAX) 0.4 MG CAPS capsule Take 1 capsule (0.4 mg total) by mouth daily. Patient not taking: Reported on 11/04/2022 07/28/22   Sater, Pearletha Furl, MD  Vitamin D, Ergocalciferol, (DRISDOL) 1.25 MG (50000 UNIT) CAPS capsule Take 1 capsule (50,000 Units total) by mouth every 7 (seven) days. Patient not taking: Reported on 11/04/2022 08/16/21   Marcine Matar, MD    I have reviewed the patient's current medications. Additionally she is on Saphnelo, an interferon 1 antagonist. She reports infrequent use of NSAIDs for pain.  Labs:     Latest Ref Rng & Units 11/05/2022    5:34 AM 11/04/2022    6:11 PM 06/17/2022    1:42 AM  BMP  Glucose 70 - 99 mg/dL 433  97  91    BUN 6 - 20 mg/dL 49  57  12   Creatinine 0.44 - 1.00 mg/dL 2.95  1.88  4.16   Sodium 135 - 145 mmol/L 138  138  139   Potassium 3.5 - 5.1 mmol/L 3.4  3.6  3.6   Chloride 98 - 111 mmol/L 110  104  109   CO2 22 - 32 mmol/L 20  20  25    Calcium 8.9 - 10.3 mg/dL 8.2  8.9  8.6     Urinalysis    Component Value Date/Time   COLORURINE YELLOW 11/04/2022 1811   APPEARANCEUR HAZY (A) 11/04/2022 1811   LABSPEC 1.021 11/04/2022 1811   PHURINE 5.0 11/04/2022 1811   GLUCOSEU NEGATIVE 11/04/2022 1811   HGBUR LARGE (A) 11/04/2022 1811   BILIRUBINUR NEGATIVE 11/04/2022 1811   BILIRUBINUR Neg 05/08/2010 1553   KETONESUR NEGATIVE 11/04/2022 1811   PROTEINUR 100 (A) 11/04/2022 1811   UROBILINOGEN 1.0 11/11/2013 0150   NITRITE NEGATIVE 11/04/2022 1811   LEUKOCYTESUR TRACE (A) 11/04/2022 1811     ROS:  Pertinent items are noted in HPI.  Physical Exam: Vitals:   11/05/22 0042 11/05/22 0733  BP: 122/71 101/66  Pulse: 94 86  Resp:  17  Temp: 98.7 F (37.1 C) (!) 97.4 F (36.3 C)  SpO2: 96% 97%      No distress, tired-appearing Heart rate and rhythm normal, strong radial pulses, no rubs appreciated, no LE edema Breathing comfortably on room air, lungs sound clear Abdomen is soft and non-tender Skin without rashes on exposed areas Some thigh tenderness bilaterally Able to sit up in bed with significant effort, alert and oriented, no focal deficits  Assessment/Plan:  37 year old female with lupus and multiple sclerosis on immunomodulators presents with weakness and workup notable for rhabdomyolysis and AKI, myopathy of uncertain etiology.  Non-traumatic rhabdomyolysis Tenderness and increasing CK is concerning for ongoing muscle breakdown. Myopathy of uncertain etiology. No recent falls with prolonged down-time, no seizure history. Culprit medications include hydroxychloroquine. She also takes azathioprine  and Saphnelo for lupus. Recently stopped prednisone taper for lupus flare. Query  drug-induced myopathy verus lupus myositis. Wonder about association with MS. Will consult with pharmacy for medication review for other culprit medications--there are some case reports of gabapentin-associated myositis, apparently. Appreciate neurology and surgery consultation for help with differential and management. Spoke with her outpatient rheumatologist who recommends muscle biopsy. Continue IV fluids at present rate, 250 mL/h. Continue trending CK. Deferring steroids until after biopsy, per neurology.  Lupus Follows with rheumatology in outpatient setting. Typically 1-2 flares per year. Was hospitalized in May 2024 for a flare. History of lupus pericarditis. AKI improving with fluids, urine is bland except for few WBC, makes lupus nephritis less likely. Azathioprine and hydroxychloroquine continued.  AKI Per above, more likely rhabdomyolysis than lupus nephritis. Continue IV fluid infusion and trending creatinine.  Multiple sclerosis Follows with neurology in outpatient setting. Query whether May hospitalization was due to MS exacerbation rather than SLE, per last clinic note. History of cervical spinal lesion and oligoclonal bands in CSF. Not on any disease modifying therapy for this at present.  Hypertension Agree with holding home medications for now.  Anemia Likely from inflammation, chronic.  Final recommendations pending attending attestation.  Marrianne Mood MD 11/05/2022, 10:33 AM     Seen and examined independently this morning.  Agree with note and exam as documented above by resident physician Dr. Benito Mccreedy and as noted here.  Emily Hicks is a 37 year old female with a history of lupus and multiple sclerosis who presented to the hospital with severe fatigue.  This has been worsening over the last several weeks.  She has not been eating as much.  She states that she has some soreness in her legs which has been going on for the last few weeks.  She has been in bed a sizable  amount of time in the last week.  She has had some weakness especially in her legs.  Her family encouraged her to go to the hospital.  Here she was found to have AKI and add CK level of 24,000 which is now almost 50,000.  She was hydrated overnight and fluids were increased after noting the sizable elevation in CK.  She does not have a history of seizures and no history of fall with prolonged downtime.  She is not on a statin.  Her last lupus flare was in May.  At that time she had severe fatigue and was treated with prednisone.  I called her outpatient rheumatologist, Dr. Casimer Lanius .  She has since been started on Saphnelo and has been doing well per her rheumatology.  She denies any new medications.  She states that she is not on any medications specifically directed at her MS.  She states that she has 1 or 2 flares of lupus per year.  She was diagnosed in 2020.  She had lupus pericarditis last year.  She states that she was changed to azathioprine from CellCept "a while back."  She is not aware of previous issues with her kidneys.  She cannot recall her last use of NSAIDs but has used them before, not regularly.  Her sister was at bedside and supplemented her history  Rheumatology recommended a muscle biopsy and agreed with a neurology consult.    General adult female in bed in no acute distress but quite weak HEENT normocephalic atraumatic extraocular movements intact sclera anicteric Neck supple trachea midline Lungs clear to auscultation bilaterally normal work of breathing at rest  Heart S1S2 no  rub Abdomen soft nontender nondistended Extremities no pitting edema  Psych normal mood and affect Neuro - she requires assistance to sit up on the side of the bed from a reclining position; alert and oriented x 3 provides a history and follows commands   # AKI - Secondary to rhabdomyolysis - Continue fluids - Less concern for lupus nephritis given her improvement with supportive measures thus  far and her UA - would have a low threshold to reduce gabapentin to 300 mg twice a day dosing   # Rhabdomyolysis - Continue aggressive fluids - We have reached out to pharmacy to see if any causative agents.  From my very basic review hydroxychloroquine can cause myositis but unsure how likely this would be vs lupus or MS   # Muscle weakness - unclear if MS or myositis from lupus or something else.   - I have asked the team to consult neurology and appreciate their assistance - Steroids are per discretion of neurology - Spoke with primary team this AM re: Muscle biopsy and appreciate their assistance  # SLE/lupus - patient had been doing well on saphnelo per her rheumatologist  # MS  - Per neurology  # Normocytic anemia - may be secondary to chronic disease - s/p aggressive fluid resuscitation with rhabdo  # Hypokalemia - has been repleted   Thank you for the consult.  Please do not hesitate to contact me with any questions regarding our patient  Estanislado Emms, MD 11/05/2022 6:11 PM

## 2022-11-05 NOTE — Progress Notes (Signed)
Patient was taken for MRI via bed at 1345PM. I was informed that patient is claustrophobic by the MRI staff. Made provider aware and medication given per order at the MRI.

## 2022-11-05 NOTE — Progress Notes (Signed)
PROGRESS NOTE  Emily Hicks ZOX:[id number] DOB: [birth year]   PCP: Patient, No Pcp Per  Patient is from: Home.  DOA: 11/04/2022 LOS: 1  Chief complaints Chief Complaint  Patient presents with   Weakness     Brief Narrative / Interim history: 37 year old F with PMH of MS, lupus pericarditis, pancytopenia and obesity presenting with generalized weakness and progressive lower extremity weakness with pain for about 2 weeks, and admitted with working diagnosis of SLE, AKI and rhabdomyolysis.  Per patient, symptoms felt like lupus flare.  In ED, vital stable. Cr 2.24 (baseline about 0.6).  BUN 57.  AST 391.  ALT 105.  CK20 3975.  Troponin negative.  CRP 7.3.  WBC 3.3.  Hgb 10.9.  UA without significant finding other than large Hgb without significant RBC.  CXR and KUB without significant finding.  Renal ultrasound ordered.  Neurology and nephrology consulted.  Patient was started on IV fluid.    Subjective: Seen and examined earlier this morning.  No major events overnight of this morning.  No complaints other than feeling uncomfortable in hospital bed.  Reports improvement in pain and weakness.  Renal function and LFT improved but CK after 49,400.   Objective: Vitals:   11/05/22 0733 11/05/22 1123 11/05/22 1125 11/05/22 1130  BP: 101/66 117/78 119/83 109/78  Pulse: 86 84 85 96  Resp: 17     Temp: (!) 97.4 F (36.3 C)     TempSrc: Oral     SpO2: 97% 100% 100% 99%  Weight:      Height:        Examination:  GENERAL: No apparent distress.  Nontoxic. HEENT: MMM.  Vision and hearing grossly intact.  NECK: Supple.  No apparent JVD.  RESP:  No IWOB.  Fair aeration bilaterally. CVS:  RRR. Heart sounds normal.  ABD/GI/GU: BS+. Abd soft, NTND.  MSK/EXT: Symmetric weakness in all extremities, about 4/5.  No apparent deformity. No edema.  SKIN: no apparent skin lesion or wound NEURO: Awake, alert and oriented appropriately.  No apparent focal neuro deficit.  Motor 4/5 in all  extremities. PSYCH: Calm. Normal affect.   Procedures:  None  Microbiology summarized: COVID-19 PCR nonreactive Urine culture pending  Assessment and plan: Active Problems:   SLE (systemic lupus erythematosus related syndrome) (HCC)   Normocytic anemia   Obesity, Class I, BMI 30.0-34.9 (see actual BMI)   Multiple sclerosis (HCC)   Elevated liver enzymes   Essential hypertension   AKI (acute kidney injury) (HCC)   Rhabdomyolysis   Generalized and lower extremity weakness and pain: Likely due to rhabdo.  No report of trauma, exertion or excessive dehydration.  Concerned about myopathy with lupus flareup.  She reports some improvement in lower extremity strength today.  No significant pain, numbness or tingling.  No chest pain or dyspnea. -Will give IV fluid bolus 2 L followed by 250 cc an hour -Appreciate input by nephrology -Check UDS.  Nontraumatic rhabdomyolysis: Concerned about myopathy from lupus flareup.  -Management as above -Continue monitoring   SLE with possible flare: On Plaquenil, azathioprine and Saphnelo outpatient.  Recently finished steroid course.  Reportedly she was on CellCept in the past but it stopped working. -Nephrology discussed with patient's outpatient rheumatology who recommended muscle biopsy and a steroid -Neurology and general surgery consulted -Follow serologies  AKI: Could be due to rhabdo and possibly lupus.  Admits to occasional ibuprofen as well.  Renal US without acute finding Recent Labs    06/11/22 1135 06/16/22 1433 06/16/22  1835 06/17/22 0142 11/04/22 1811 11/05/22 0534  BUN 9 9  --  12 57* 49*  CREATININE 0.76 0.79 0.74 0.65 2.24* 1.82*  -IV fluid as above -Follow serologies -Nephrology on board     Normocytic anemia: Anemia panel consistent with anemia of chronic disease.  Drop in Hgb likely dilutional Recent Labs    06/11/22 1135 06/16/22 1433 06/16/22 1835 06/17/22 0142 11/04/22 1811 11/05/22 0534  HGB 12.1 11.4*  11.6* 10.3* 10.9* 9.4*  -Continue monitoring    Multiple sclerosis (HCC) -Defer to neurology   Elevated liver enzymes: Pattern consistent with rhabdo.  Acute hepatitis panel negative.  RUQ [place] without acute finding.  Improved. -Continue monitoring   Essential hypertension: Normotensive. -Continue holding home meds  Class II obesity Body mass index is 38.92 kg/m.          DVT prophylaxis:  SCDs Start: 11/04/22 2238  Code Status: Full code Family Communication: Updated patient's sister at bedside Level of care: Telemetry Medical Status is: Inpatient Remains inpatient appropriate because: Rhabdomyolysis, bilateral lower extremity weakness and AKI   Final disposition: Home Consultants:  Nephrology Neurology General Surgery Patient's outpatient rheumatology  55 minutes with more than 50% spent in reviewing records, counseling patient/family and coordinating care.   Sch Meds:  Scheduled Meds:  azaTHIOprine  150 mg Oral Daily   gabapentin  300 mg Oral TID   hydroxychloroquine  200 mg Oral BID   Continuous Infusions:  sodium chloride 250 mL/hr at 11/05/22 0900   PRN Meds:.ondansetron **OR** ondansetron (ZOFRAN) IV  Antimicrobials: Anti-infectives (From admission, onward)    Start     Dose/Rate Route Frequency Ordered Stop   11/05/22 0100  hydroxychloroquine (PLAQUENIL) tablet 200 mg        200 mg Oral 2 times daily 11/04/22 2237          I have personally reviewed the following labs and images: CBC: Recent Labs  Lab 11/04/22 1811 11/05/22 0534  WBC 3.3* 2.7*  HGB 10.9* 9.4*  HCT 34.5* 29.7*  MCV 84.8 83.0  PLT 185 189   BMP &GFR Recent Labs  Lab 11/04/22 1811 11/04/22 2243 11/05/22 0534  NA 138  --  138  K 3.6  --  3.4*  CL 104  --  110  CO2 20*  --  20*  GLUCOSE 97  --  105*  BUN 57*  --  49*  CREATININE 2.24*  --  1.82*  CALCIUM 8.9  --  8.2*  MG  --  2.1 2.1  PHOS  --  3.4 3.5   Estimated Creatinine Clearance: 44.5 mL/min (A) (by  C-G formula based on SCr of 1.82 mg/dL (H)). Liver & Pancreas: Recent Labs  Lab 11/04/22 1811 11/05/22 0534  AST 391* 317*  ALT 105* 88*  ALKPHOS 39 35*  BILITOT 0.6 0.3  PROT 7.3 5.9*  ALBUMIN 3.0* 2.2*   No results for input(s): "LIPASE", "AMYLASE" in the last 168 hours. No results for input(s): "AMMONIA" in the last 168 hours. Diabetic: No results for input(s): "HGBA1C" in the last 72 hours. No results for input(s): "GLUCAP" in the last 168 hours. Cardiac Enzymes: Recent Labs  Lab 11/04/22 2243 11/05/22 0534  CKTOTAL 23,975* 49,442*   No results for input(s): "PROBNP" in the last 8760 hours. Coagulation Profile: No results for input(s): "INR", "PROTIME" in the last 168 hours. Thyroid Function Tests: Recent Labs    11/04/22 2243  TSH 0.661   Lipid Profile: No results for input(s): "CHOL", "HDL", "LDLCALC", "TRIG", "  CHOLHDL", "LDLDIRECT" in the last 72 hours. Anemia Panel: Recent Labs    11/04/22 2243 11/05/22 0534  VITAMINB12  --  309  FOLATE  --  9.6  FERRITIN 721*  --   TIBC 210*  --   IRON 24*  --   RETICCTPCT <0.4*  --    Urine analysis:    Component Value Date/Time   COLORURINE YELLOW 11/04/2022 1811   APPEARANCEUR HAZY (A) 11/04/2022 1811   LABSPEC 1.021 11/04/2022 1811   PHURINE 5.0 11/04/2022 1811   GLUCOSEU NEGATIVE 11/04/2022 1811   HGBUR LARGE (A) 11/04/2022 1811   BILIRUBINUR NEGATIVE 11/04/2022 1811   BILIRUBINUR Neg 05/08/2010 1553   KETONESUR NEGATIVE 11/04/2022 1811   PROTEINUR 100 (A) 11/04/2022 1811   UROBILINOGEN 1.0 11/11/2013 0150   NITRITE NEGATIVE 11/04/2022 1811   LEUKOCYTESUR TRACE (A) 11/04/2022 1811   Sepsis Labs: Invalid input(s): "PROCALCITONIN", "LACTICIDVEN"  Microbiology: Recent Results (from the past 240 hour(s))  SARS Coronavirus 2 by RT PCR (hospital order, performed in [facility] hospital lab) *cepheid single result test* Anterior Nasal Swab     Status: None   Collection Time: 11/04/22 10:50 PM   Specimen:  Anterior Nasal Swab  Result Value Ref Range Status   SARS Coronavirus 2 by RT PCR NEGATIVE NEGATIVE Final    Comment: (NOTE) SARS-CoV-2 target nucleic acids are NOT DETECTED.  The SARS-CoV-2 RNA is generally detectable in upper and lower respiratory specimens during the acute phase of infection. The lowest concentration of SARS-CoV-2 viral copies this assay can detect is 250 copies / mL. A negative result does not preclude SARS-CoV-2 infection and should not be used as the sole basis for treatment or other patient management decisions.  A negative result may occur with improper specimen collection / handling, submission of specimen other than nasopharyngeal swab, presence of viral mutation(s) within the areas targeted by this assay, and inadequate number of viral copies (<250 copies / mL). A negative result must be combined with clinical observations, patient history, and epidemiological information.  Fact Sheet for Patients:   RoadLapTop.co.za  Fact Sheet for Healthcare Providers: http://kim-miller.com/  This test is not yet approved or  cleared by the [place] FDA and has been authorized for detection and/or diagnosis of SARS-CoV-2 by FDA under an Emergency Use Authorization (EUA).  This EUA will remain in effect (meaning this test can be used) for the duration of the COVID-19 declaration under Section 564(b)(1) of the Act, 21 U.S.C. section 360bbb-3(b)(1), unless the authorization is terminated or revoked sooner.  Performed at [facility], [address] [place], [place] [address]     Radiology Studies: US RENAL  Result Date: 11/04/2022 CLINICAL DATA:  Acute kidney injury EXAM: RENAL / URINARY TRACT ULTRASOUND COMPLETE COMPARISON:  CT 06/16/2022 FINDINGS: Right Kidney: Renal measurements: 11.5 x 4.8 x 5.7 cm = volume: 165 ML. Mildly increased echotexture. No mass or hydronephrosis. Left Kidney: Renal  measurements: 12.7 x 6.3 x 5.7 cm = volume: 238 mL. Mildly increased echotexture. No mass or hydronephrosis. Bladder: Appears normal for degree of bladder distention. Other: None. IMPRESSION: No acute findings.  No hydronephrosis. Increased echotexture within the kidneys compatible with chronic medical renal disease. Electronically Signed   By: Charlett Nose M.D.   On: 11/04/2022 23:47   US Abdomen Limited RUQ (LIVER/GB)  Result Date: 11/04/2022 CLINICAL DATA:  Elevated LFTs. EXAM: ULTRASOUND ABDOMEN LIMITED RIGHT UPPER QUADRANT COMPARISON:  Right upper quadrant ultrasound dated 10/30/2010. FINDINGS: Gallbladder: Cholecystectomy Common bile duct: Diameter:  2 mm Liver: The liver is unremarkable as visualized. Portal vein is patent on color Doppler imaging with normal direction of blood flow towards the liver. Other: None. IMPRESSION: Cholecystectomy, otherwise unremarkable right upper quadrant ultrasound. Electronically Signed   By: Elgie Collard M.D.   On: 11/04/2022 23:23   DG Abd 1 View  Result Date: 11/04/2022 CLINICAL DATA:  Nausea and abdominal pain EXAM: ABDOMEN - 1 VIEW COMPARISON:  None Available. FINDINGS: Scattered large and small bowel gas is noted. No obstructive changes are noted. Changes of prior cholecystectomy are seen. No free air is noted. No bony abnormality is noted. IMPRESSION: No acute abnormality seen. Electronically Signed   By: Alcide Clever M.D.   On: 11/04/2022 22:45   DG Chest Port 1 View  Result Date: 11/04/2022 CLINICAL DATA:  Weakness EXAM: PORTABLE CHEST 1 VIEW COMPARISON:  06/16/2018 FINDINGS: The heart size and mediastinal contours are within normal limits. Both lungs are clear. The visualized skeletal structures are unremarkable. IMPRESSION: No active disease. Electronically Signed   By: Alcide Clever M.D.   On: 11/04/2022 22:44      Rance Smithson T. Jaegar Croft [facility]  If 7PM-7AM, please contact night-coverage [email or web address] 11/05/2022, 12:18 PM

## 2022-11-06 ENCOUNTER — Encounter (HOSPITAL_COMMUNITY)
Admission: EM | Disposition: A | Discharge status: Home / Self Care | Payer: Self-pay | Source: Home / Self Care | Attending: Student

## 2022-11-06 ENCOUNTER — Encounter (HOSPITAL_COMMUNITY): Payer: Self-pay | Admitting: Internal Medicine

## 2022-11-06 ENCOUNTER — Other Ambulatory Visit: Payer: Self-pay

## 2022-11-06 ENCOUNTER — Inpatient Hospital Stay (HOSPITAL_COMMUNITY): Payer: 59 | Admitting: Anesthesiology

## 2022-11-06 DIAGNOSIS — F413 Other mixed anxiety disorders: Secondary | ICD-10-CM

## 2022-11-06 DIAGNOSIS — M609 Myositis, unspecified: Secondary | ICD-10-CM

## 2022-11-06 DIAGNOSIS — N179 Acute kidney failure, unspecified: Secondary | ICD-10-CM | POA: Diagnosis not present

## 2022-11-06 DIAGNOSIS — I10 Essential (primary) hypertension: Secondary | ICD-10-CM | POA: Diagnosis not present

## 2022-11-06 DIAGNOSIS — D649 Anemia, unspecified: Secondary | ICD-10-CM | POA: Diagnosis not present

## 2022-11-06 DIAGNOSIS — R748 Abnormal levels of other serum enzymes: Secondary | ICD-10-CM | POA: Diagnosis not present

## 2022-11-06 HISTORY — PX: MUSCLE BIOPSY: SHX716

## 2022-11-06 LAB — CBC
HCT: 29.4 % — ABNORMAL LOW (ref 36.0–46.0)
Hemoglobin: 9 g/dL — ABNORMAL LOW (ref 12.0–15.0)
MCH: 25.9 pg — ABNORMAL LOW (ref 26.0–34.0)
MCHC: 30.6 g/dL (ref 30.0–36.0)
MCV: 84.5 fL (ref 80.0–100.0)
Platelets: 278 10*3/uL (ref 150–400)
RBC: 3.48 MIL/uL — ABNORMAL LOW (ref 3.87–5.11)
RDW: 14.1 % (ref 11.5–15.5)
WBC: 2.6 10*3/uL — ABNORMAL LOW (ref 4.0–10.5)
nRBC: 0 % (ref 0.0–0.2)

## 2022-11-06 LAB — ENA+DNA/DS+ANTICH+CENTRO+JO...
Anti JO-1: <0.2 AI (ref 0.0–0.9)
Centromere Ab Screen: <0.2 AI (ref 0.0–0.9)
Chromatin Ab SerPl-aCnc: >8 AI — ABNORMAL HIGH (ref 0.0–0.9)
ENA SM Ab Ser-aCnc: >8 AI — ABNORMAL HIGH (ref 0.0–0.9)
Ribonucleic Protein: >8 AI — ABNORMAL HIGH (ref 0.0–0.9)
SSA (Ro) (ENA) Antibody, IgG: 2.6 AI — ABNORMAL HIGH (ref 0.0–0.9)
SSB (La) (ENA) Antibody, IgG: <0.2 AI (ref 0.0–0.9)
Scleroderma (Scl-70) (ENA) Antibody, IgG: <0.2 AI (ref 0.0–0.9)
ds DNA Ab: 64 [IU]/mL — ABNORMAL HIGH (ref 0–9)

## 2022-11-06 LAB — COMPREHENSIVE METABOLIC PANEL
ALT: 70 U/L — ABNORMAL HIGH (ref 0–44)
AST: 215 U/L — ABNORMAL HIGH (ref 15–41)
Albumin: 2 g/dL — ABNORMAL LOW (ref 3.5–5.0)
Alkaline Phosphatase: 32 U/L — ABNORMAL LOW (ref 38–126)
Anion gap: 7 (ref 5–15)
BUN: 26 mg/dL — ABNORMAL HIGH (ref 6–20)
CO2: 17 mmol/L — ABNORMAL LOW (ref 22–32)
Calcium: 7.9 mg/dL — ABNORMAL LOW (ref 8.9–10.3)
Chloride: 118 mmol/L — ABNORMAL HIGH (ref 98–111)
Creatinine, Ser: 1.51 mg/dL — ABNORMAL HIGH (ref 0.44–1.00)
GFR, Estimated: 46 mL/min — ABNORMAL LOW (ref >60)
Glucose, Bld: 94 mg/dL (ref 70–99)
Potassium: 4.1 mmol/L (ref 3.5–5.1)
Sodium: 142 mmol/L (ref 135–145)
Total Bilirubin: 0.4 mg/dL (ref 0.3–1.2)
Total Protein: 5.2 g/dL — ABNORMAL LOW (ref 6.5–8.1)

## 2022-11-06 LAB — URINE CULTURE

## 2022-11-06 LAB — ANTI-DNA ANTIBODY, DOUBLE-STRANDED: ds DNA Ab: 62 [IU]/mL — ABNORMAL HIGH (ref 0–9)

## 2022-11-06 LAB — PHOSPHORUS: Phosphorus: 2.6 mg/dL (ref 2.5–4.6)

## 2022-11-06 LAB — C4 COMPLEMENT: Complement C4, Body Fluid: 5 mg/dL — ABNORMAL LOW (ref 12–38)

## 2022-11-06 LAB — ALDOLASE: Aldolase: 45.6 U/L — ABNORMAL HIGH (ref 3.3–10.3)

## 2022-11-06 LAB — ANA W/REFLEX IF POSITIVE: Anti Nuclear Antibody (ANA): POSITIVE — AB

## 2022-11-06 LAB — C3 COMPLEMENT: C3 Complement: 68 mg/dL — ABNORMAL LOW (ref 82–167)

## 2022-11-06 LAB — CK
Total CK: 8947 U/L — ABNORMAL HIGH (ref 38–234)
Total CK: 7772 U/L — ABNORMAL HIGH (ref 38–234)

## 2022-11-06 LAB — MAGNESIUM: Magnesium: 1.8 mg/dL (ref 1.7–2.4)

## 2022-11-06 SURGERY — MUSCLE BIOPSY
Anesthesia: General | Site: Thigh | Laterality: Right

## 2022-11-06 MED ORDER — FENTANYL CITRATE (PF) 100 MCG/2ML IJ SOLN: 25.0000 ug | INTRAMUSCULAR | Status: DC | PRN

## 2022-11-06 MED ORDER — PHENYLEPHRINE HCL (PRESSORS) 10 MG/ML IV SOLN
INTRAVENOUS | Status: DC | PRN
Start: 2022-11-06 — End: 2022-11-06
  Administered 2022-11-06 (×3): 80 ug via INTRAVENOUS

## 2022-11-06 MED ORDER — BUPIVACAINE-EPINEPHRINE 0.25% -1:200000 IJ SOLN
INTRAMUSCULAR | Status: DC | PRN
Start: 2022-11-06 — End: 2022-11-06
  Administered 2022-11-06: 10 mL

## 2022-11-06 MED ORDER — ONDANSETRON HCL 4 MG/2ML IJ SOLN
INTRAMUSCULAR | Status: AC
  Filled 2022-11-06: qty 2

## 2022-11-06 MED ORDER — ROCURONIUM BROMIDE 10 MG/ML (PF) SYRINGE
PREFILLED_SYRINGE | INTRAVENOUS | Status: AC
  Filled 2022-11-06: qty 10

## 2022-11-06 MED ORDER — OXYCODONE HCL 5 MG PO TABS: 5.0000 mg | ORAL_TABLET | Freq: Once | ORAL | Status: DC | PRN

## 2022-11-06 MED ORDER — LACTATED RINGERS IV SOLN: INTRAVENOUS | Status: DC

## 2022-11-06 MED ORDER — OXYCODONE HCL 5 MG/5ML PO SOLN: 5.0000 mg | Freq: Once | ORAL | Status: DC | PRN

## 2022-11-06 MED ORDER — MIDAZOLAM HCL 2 MG/2ML IJ SOLN
INTRAMUSCULAR | Status: AC
  Filled 2022-11-06: qty 2

## 2022-11-06 MED ORDER — PHENYLEPHRINE 80 MCG/ML (10ML) SYRINGE FOR IV PUSH (FOR BLOOD PRESSURE SUPPORT)
PREFILLED_SYRINGE | INTRAVENOUS | Status: AC
  Filled 2022-11-06: qty 10

## 2022-11-06 MED ORDER — PROPOFOL 500 MG/50ML IV EMUL
INTRAVENOUS | Status: DC | PRN
Start: 2022-11-06 — End: 2022-11-06
  Administered 2022-11-06: 200 mg via INTRAVENOUS

## 2022-11-06 MED ORDER — FENTANYL CITRATE (PF) 100 MCG/2ML IJ SOLN
INTRAMUSCULAR | Status: DC | PRN
Start: 2022-11-06 — End: 2022-11-06
  Administered 2022-11-06: 100 ug via INTRAVENOUS
  Administered 2022-11-06: 50 ug via INTRAVENOUS

## 2022-11-06 MED ORDER — 0.9 % SODIUM CHLORIDE (POUR BTL) OPTIME
TOPICAL | Status: DC | PRN
Start: 2022-11-06 — End: 2022-11-06
  Administered 2022-11-06: 1000 mL

## 2022-11-06 MED ORDER — BUPIVACAINE-EPINEPHRINE (PF) 0.25% -1:200000 IJ SOLN
INTRAMUSCULAR | Status: AC
  Filled 2022-11-06: qty 30

## 2022-11-06 MED ORDER — CHLORHEXIDINE GLUCONATE 0.12 % MT SOLN
OROMUCOSAL | Status: AC
  Administered 2022-11-06: 15 mL via OROMUCOSAL
  Filled 2022-11-06: qty 15

## 2022-11-06 MED ORDER — DEXAMETHASONE SODIUM PHOSPHATE 10 MG/ML IJ SOLN
INTRAMUSCULAR | Status: DC | PRN
Start: 2022-11-06 — End: 2022-11-06
  Administered 2022-11-06: 10 mg via INTRAVENOUS

## 2022-11-06 MED ORDER — HEMOSTATIC AGENTS (NO CHARGE) OPTIME
TOPICAL | Status: DC | PRN
Start: 2022-11-06 — End: 2022-11-06
  Administered 2022-11-06: 1 via TOPICAL

## 2022-11-06 MED ORDER — FENTANYL CITRATE (PF) 250 MCG/5ML IJ SOLN
INTRAMUSCULAR | Status: AC
  Filled 2022-11-06: qty 5

## 2022-11-06 MED ORDER — ORAL CARE MOUTH RINSE: 15.0000 mL | Freq: Once | OROMUCOSAL | Status: AC

## 2022-11-06 MED ORDER — PROPOFOL 10 MG/ML IV BOLUS
INTRAVENOUS | Status: AC
  Filled 2022-11-06: qty 20

## 2022-11-06 MED ORDER — LIDOCAINE HCL (CARDIAC) PF 100 MG/5ML IV SOSY
PREFILLED_SYRINGE | INTRAVENOUS | Status: DC | PRN
Start: 2022-11-06 — End: 2022-11-06
  Administered 2022-11-06: 30 mg via INTRAVENOUS

## 2022-11-06 MED ORDER — CHLORHEXIDINE GLUCONATE 0.12 % MT SOLN: 15.0000 mL | Freq: Once | OROMUCOSAL | Status: AC

## 2022-11-06 MED ORDER — MIDAZOLAM HCL 5 MG/5ML IJ SOLN
INTRAMUSCULAR | Status: DC | PRN
Start: 2022-11-06 — End: 2022-11-06
  Administered 2022-11-06: 2 mg via INTRAVENOUS

## 2022-11-06 MED ORDER — LIDOCAINE 2% (20 MG/ML) 5 ML SYRINGE
INTRAMUSCULAR | Status: AC
  Filled 2022-11-06: qty 5

## 2022-11-06 MED ORDER — CEFAZOLIN SODIUM-DEXTROSE 2-4 GM/100ML-% IV SOLN
2.0000 g | INTRAVENOUS | Status: AC
  Administered 2022-11-06: 2 g via INTRAVENOUS
  Filled 2022-11-06: qty 100

## 2022-11-06 MED ORDER — CALCIUM CHLORIDE 10 % IV SOLN
INTRAVENOUS | Status: DC | PRN
Start: 2022-11-06 — End: 2022-11-06
  Administered 2022-11-06 (×2): 250 mg via INTRAVENOUS

## 2022-11-06 MED ORDER — DEXAMETHASONE SODIUM PHOSPHATE 10 MG/ML IJ SOLN
INTRAMUSCULAR | Status: AC
  Filled 2022-11-06: qty 1

## 2022-11-06 MED ORDER — SUCCINYLCHOLINE CHLORIDE 200 MG/10ML IV SOSY
PREFILLED_SYRINGE | INTRAVENOUS | Status: AC
  Filled 2022-11-06: qty 10

## 2022-11-06 MED ORDER — ONDANSETRON HCL 4 MG/2ML IJ SOLN
INTRAMUSCULAR | Status: DC | PRN
Start: 2022-11-06 — End: 2022-11-06
  Administered 2022-11-06: 4 mg via INTRAVENOUS

## 2022-11-06 SURGICAL SUPPLY — 30 items
ADH SKN CLS APL DERMABOND .7 (GAUZE/BANDAGES/DRESSINGS) ×1
APL PRP STRL LF DISP 70% ISPRP (MISCELLANEOUS) ×1
CHLORAPREP W/TINT 26 (MISCELLANEOUS) IMPLANT
COVER SURGICAL LIGHT HANDLE (MISCELLANEOUS) IMPLANT
DERMABOND ADVANCED .7 DNX12 (GAUZE/BANDAGES/DRESSINGS) IMPLANT
DRAPE LAPAROTOMY 100X72 PEDS (DRAPES) IMPLANT
ELECT REM PT RETURN 9FT ADLT (ELECTROSURGICAL) ×1
ELECTRODE REM PT RTRN 9FT ADLT (ELECTROSURGICAL) IMPLANT
GAUZE 4X4 16PLY ~~LOC~~+RFID DBL (SPONGE) IMPLANT
GLOVE SURG 8.5 LATEX PF (GLOVE) IMPLANT
GOWN STRL REUS W/ TWL LRG LVL3 (GOWN DISPOSABLE) IMPLANT
GOWN STRL REUS W/TWL LRG LVL3 (GOWN DISPOSABLE) ×2
HEMOSTAT ARISTA ABSORB 3G PWDR (HEMOSTASIS) IMPLANT
KIT BASIN OR (CUSTOM PROCEDURE TRAY) IMPLANT
KIT TURNOVER KIT B (KITS) IMPLANT
NDL HYPO 25GX1X1/2 BEV (NEEDLE) IMPLANT
NEEDLE HYPO 25GX1X1/2 BEV (NEEDLE) ×1 IMPLANT
NS IRRIG 1000ML POUR BTL (IV SOLUTION) IMPLANT
PACK BASIC III (CUSTOM PROCEDURE TRAY) ×1
PACK SRG BSC III STRL LF ECLPS (CUSTOM PROCEDURE TRAY) IMPLANT
PAD ARMBOARD 7.5X6 YLW CONV (MISCELLANEOUS) IMPLANT
PENCIL SMOKE EVACUATOR (MISCELLANEOUS) IMPLANT
SUT MON AB 4-0 PC3 18 (SUTURE) IMPLANT
SUT SILK 2 0 TIES 17X18 (SUTURE) ×1
SUT SILK 2-0 18XBRD TIE BLK (SUTURE) IMPLANT
SUT VIC AB 3-0 SH 18 (SUTURE) IMPLANT
SYR CONTROL 10ML LL (SYRINGE) IMPLANT
TOWEL GREEN STERILE (TOWEL DISPOSABLE) IMPLANT
TOWEL GREEN STERILE FF (TOWEL DISPOSABLE) IMPLANT
TUBE CONNECTING 12X1/4 (SUCTIONS) IMPLANT

## 2022-11-06 NOTE — Evaluation (Signed)
Occupational Therapy Evaluation Patient Details Name: Emily Hicks MRN: [id number] DOB: 04-29-1985 Today's Date: 11/06/2022   History of Present Illness Pt is 37 yo presenting with generalized weakness and worsening leg strength over the past few weeks. PMH: Lupus, MS   Clinical Impression   Pt is typically independent, drives and works. She has been needing to use her rollator intermittently and assist for some IADLs in the weeks leading up to admission. Pt presents with generalized weakness and impaired standing balance requiring up to CGA for OOB and ADLs. Will follow acutely, but do not anticipate pt will need post acute OT..       If plan is discharge home, recommend the following: A little help with walking and/or transfers;Assistance with cooking/housework;Assist for transportation;Help with stairs or ramp for entrance    Functional Status Assessment  Patient has had a recent decline in their functional status and demonstrates the ability to make significant improvements in function in a reasonable and predictable amount of time.  Equipment Recommendations  None recommended by OT    Recommendations for Other Services       Precautions / Restrictions Precautions Precautions: Fall Restrictions Weight Bearing Restrictions: No      Mobility Bed Mobility Overal bed mobility: Modified Independent             General bed mobility comments: HOB up slightly    Transfers Overall transfer level: Needs assistance   Transfers: Sit to/from Stand, Bed to chair/wheelchair/BSC Sit to Stand: Contact guard assist     Step pivot transfers: Contact guard assist            Balance Overall balance assessment: Mild deficits observed, not formally tested                                         ADL either performed or assessed with clinical judgement   ADL Overall ADL's : Needs assistance/impaired Eating/Feeding: Independent;Sitting   Grooming:  Wash/dry hands;Sitting;Set up   Upper Body Bathing: Set up;Sitting   Lower Body Bathing: Contact guard assist;Sit to/from stand   Upper Body Dressing : Set up;Sitting   Lower Body Dressing: Contact guard assist;Sit to/from stand Lower Body Dressing Details (indicate cue type and reason): bends forward to reach feet Toilet Transfer: Contact guard assist;Stand-pivot;BSC/3in1   Toileting- Clothing Manipulation and Hygiene: Set up;Sitting/lateral lean               Vision Baseline Vision/History: 0 No visual deficits       Perception         Praxis         Pertinent Vitals/Pain Pain Assessment Pain Assessment: No/denies pain     Extremity/Trunk Assessment Upper Extremity Assessment Upper Extremity Assessment: Generalized weakness;Overall [facility] for tasks assessed   Lower Extremity Assessment Lower Extremity Assessment: Defer to PT evaluation   Cervical / Trunk Assessment Cervical / Trunk Assessment: Normal   Communication Communication Communication: No apparent difficulties   Cognition Arousal: Alert Behavior During Therapy: WFL for tasks assessed/performed Overall Cognitive Status: Within Functional Limits for tasks assessed                                       General Comments       Exercises     Shoulder Instructions  Home Living Family/patient expects to be discharged to:: Private residence Living Arrangements: Children;Spouse/significant other Available Help at Discharge: Family;Available PRN/intermittently Type of Home: House Home Access: Stairs to enter [employer] of Steps: 2 Entrance Stairs-Rails: None (uses a post) Home Layout: One level     Bathroom Shower/Tub: Chief Strategy Officer: Handicapped height     Home Equipment: Rollator (4 wheels);BSC/3in1;Shower seat          Prior Functioning/Environment Prior Level of Function : Independent/Modified Independent;Driving;Working/employed              Mobility Comments: normally no use of AD. For the past couple of weeks has used a rollator at times ADLs Comments: Overall prior to feeling poor over the past couple of weeks pt is ind with dressing, grooming, bathing and toileting. Relies on family for some IADLs.        OT Problem List: Decreased strength;Decreased activity tolerance;Impaired balance (sitting and/or standing)      OT Treatment/Interventions: Self-care/ADL training;DME and/or AE instruction;Therapeutic activities;Patient/family education;Balance training    OT Goals(Current goals can be found in the care plan section) Acute Rehab OT Goals OT Goal Formulation: With patient Time For Goal Achievement: 11/20/22 Potential to Achieve Goals: Good ADL Goals Pt Will Perform Grooming: with modified independence;standing Pt Will Perform Lower Body Bathing: with modified independence;sit to/from stand Pt Will Perform Lower Body Dressing: with modified independence;sit to/from stand Pt Will Transfer to Toilet: with modified independence;ambulating;regular height toilet Pt Will Perform Toileting - Clothing Manipulation and hygiene: with modified independence;sit to/from stand Additional ADL Goal #1: Pt will gather items necessary for ADLs around her room mod I.  OT Frequency: Min 1X/week    Co-evaluation              AM-PAC OT "6 Clicks" Daily Activity     Outcome Measure Help from another person eating meals?: None Help from another person taking care of personal grooming?: A Little Help from another person toileting, which includes using toliet, bedpan, or urinal?: A Little Help from another person bathing (including washing, rinsing, drying)?: A Little Help from another person to put on and taking off regular upper body clothing?: A Little Help from another person to put on and taking off regular lower body clothing?: A Little 6 Click Score: 19   End of Session    Activity Tolerance: Patient  tolerated treatment well Patient left: in bed;with call bell/phone within reach;with family/visitor present  OT Visit Diagnosis: Unsteadiness on feet (R26.81);Other abnormalities of gait and mobility (R26.89);Muscle weakness (generalized) (M62.81)                Time: 2440-1027 OT Time Calculation (min): 20 min Charges:  OT General Charges $OT Visit: 1 Visit OT Evaluation $OT Eval Low Complexity: 1 Low  Berna Spare, OTR/L Acute Rehabilitation Services Office: [phone]   Evern Bio 11/06/2022, 12:21 PM

## 2022-11-06 NOTE — Anesthesia Preprocedure Evaluation (Addendum)
Anesthesia Evaluation  Patient identified by MRN, date of birth, ID band Patient awake    Reviewed: Allergy & Precautions, NPO status , Patient's Chart, lab work & pertinent test results  History of Anesthesia Complications Negative for: history of anesthetic complications  Airway Mallampati: III  TM Distance: >3 FB Neck ROM: Full  Mouth opening: Limited Mouth Opening Comment: Previous grade I view with [facility]  (+) Dental Advisory Given   Pulmonary neg pulmonary ROS   Pulmonary exam normal breath sounds clear to auscultation       Cardiovascular hypertension (metoprolol), Pt. on home beta blockers (-) angina (-) Past MI, (-) Cardiac Stents and (-) CABG (-) dysrhythmias  Rhythm:Regular Rate:Normal  TTE 09/02/2022: IMPRESSIONS     1. Left ventricular ejection fraction, by estimation, is 55 to 60%. The  left ventricle has normal function. The left ventricle has no regional  wall motion abnormalities. Indeterminate diastolic filling due to E-A  fusion.   2. Right ventricular systolic function is normal. The right ventricular  size is normal. Tricuspid regurgitation signal is inadequate for assessing  PA pressure.   3. The mitral valve is grossly normal. Trivial mitral valve  regurgitation. No evidence of mitral stenosis.   4. The aortic valve is tricuspid. Aortic valve regurgitation is not  visualized. No aortic stenosis is present.   5. The inferior vena cava is normal in size with greater than 50%  respiratory variability, suggesting right atrial pressure of 3 mmHg.     Neuro/Psych  Headaches, neg Seizures PSYCHIATRIC DISORDERS Anxiety Depression    Bilateral lower and upper extremity weakness  Neuromuscular disease (multiple sclerosis)    GI/Hepatic ,GERD  ,,Elevated LFTs   Endo/Other  negative endocrine ROS    Renal/GU ARFRenal disease     Musculoskeletal  (+) Arthritis ,    Abdominal  (+) + obese   Peds  Hematology  (+) Blood dyscrasia, anemia Lab Results      Component                Value               Date                      WBC                      2.6 (L)             11/06/2022                HGB                      9.0 (L)             11/06/2022                HCT                      29.4 (L)            11/06/2022                MCV                      84.5                11/06/2022                PLT  278                 11/06/2022              Anesthesia Other Findings Lupus - on Plaquenil  Reproductive/Obstetrics                             Anesthesia Physical Anesthesia Plan  ASA: 3  Anesthesia Plan: General   Post-op Pain Management:    Induction: Intravenous  PONV Risk Score and Plan: 3 and Ondansetron, Dexamethasone and Treatment may vary due to age or medical condition  Airway Management Planned: LMA  Additional Equipment:   Intra-op Plan:   Post-operative Plan: Extubation in OR  Informed Consent: I have reviewed the patients History and Physical, chart, labs and discussed the procedure including the risks, benefits and alternatives for the proposed anesthesia with the patient or authorized representative who has indicated his/her understanding and acceptance.     Dental advisory given  Plan Discussed with: Anesthesiologist and CRNA  Anesthesia Plan Comments:        Anesthesia Quick Evaluation

## 2022-11-06 NOTE — Plan of Care (Signed)

## 2022-11-06 NOTE — Transfer of Care (Signed)
Immediate Anesthesia Transfer of Care Note  Patient: Emily Hicks  Procedure(s) Performed: RIGHT THIGH MUSCLE BIOPSY (Right: Thigh)  Patient Location: PACU  Anesthesia Type:General  Level of Consciousness: sedated and drowsy  Airway & Oxygen Therapy: Patient Spontanous Breathing and Patient connected to face mask oxygen  Post-op Assessment: Report given to RN and Post -op Vital signs reviewed and stable  Post vital signs: Reviewed and stable  Last Vitals:  Vitals Value Taken Time  BP 121/85 11/06/22 1212  Temp 97.4   Pulse 83 11/06/22 1215  Resp 19 11/06/22 1215  SpO2 100 % 11/06/22 1215  Vitals shown include unfiled device data.  Last Pain:  Vitals:   11/06/22 1012  TempSrc: Oral  PainSc: 0-No pain         Complications: No notable events documented.

## 2022-11-06 NOTE — Op Note (Signed)
11/06/2022  12:26 PM  PATIENT:  Emily Hicks  37 y.o. female  Patient Care Team: Patient, No Pcp Per as PCP - General (General Practice) Christell Constant, MD as PCP - Cardiology (Cardiology) Currie Paris, MD (Inactive) (Emergency Medicine)  PRE-OPERATIVE DIAGNOSIS:  Myositis  POST-OPERATIVE DIAGNOSIS:  Same  PROCEDURE:  Right thigh muscle biopsy - quadricept   SURGEON:  Stephanie Coup. Emberlee Sortino, MD  ASSISTANT: Lysle Rubens, MD  ANESTHESIA:   local and general (LMA)  COUNTS:  Sponge, needle and instrument counts were reported correct x2 at the conclusion of the operation.  EBL: 10 mL  DRAINS: None  SPECIMEN: Right thigh quadricept muscle - taken fresh directly to pathology  COMPLICATIONS: None  FINDINGS: Right rectus femoris muscle biopsy obtained - ~ 1/2 inch by 2 inch piece x 2.  DISPOSITION: PACU in satisfactory condition  DESCRIPTION: The patient was identified in preop holding and taken to the OR where she was placed on the operating room table. SCDs were placed. General anesthesia (with LMA) was induced without difficulty. She was then prepped and draped in the usual sterile fashion. A surgical timeout was performed indicating the correct patient, procedure, positioning and need for preoperative antibiotics.   The right thigh skin was marked anteriorly.  This was then incised with a 15 blade.  Subcutaneous tissue divided electrocautery.  Dissection is carried down to the level of the fascial sheath overlying the rectus femoris.  The fascia was then incised sharply.  Were able to dissect away the muscle from the overlying fascia.  A wheat Zada Girt was placed for assistance in maintaining retraction.  The muscle was then dissected out sharply using a right angle clamp.  We dissected out and approximately 2 inch long by 1/2 inch wide bundle of muscle tissue.  This was on the proximal portion of the muscle.  Both ends were subsequently suture-ligated with 2-0 silk  ties.  The muscle bundle was then divided sharply using Metzenbaum scissors.  This was dissected free of the other muscle bundle fibers using Metzenbaum scissors.  Hemostasis was achieved with electrocautery.  The specimen was passed off fresh.  We also obtained a similar sized piece from a neighboring bundle of muscle within the rectus femoris for additional tissue to ensure more than adequate amount was present for the pathologist to evaluate.  Hemostasis was achieved electrocautery.  The wound is irrigated.  Hemostasis is verified.  We went ahead and placed Arista powder to assist in maintaining hemostasis.  The fascial incision was closed using a 3-0 Vicryl suture.  Subcutaneous tissue and dead space was closed using 3-0 Vicryl suture.  All sponge, needle, and instrument counts are reported correct.  The skin was then approximated with a subcuticular 4-0 Monocryl suture.  The wound was washed and dried.  Dermabond was applied.  She was then awake from anesthesia, extubated, transferred to a stretcher for transport to recovery in satisfactory condition.  I have attempted on 2 separate occasions to reach her husband, Emily Hicks, to update him on her status but have been unsuccessful at reaching and thus far.

## 2022-11-06 NOTE — Consult Note (Signed)
[facility] Surgery Consult Note  Emily Hicks 01/25/1986  [id number].    Requesting MD: Candelaria Stagers Chief Complaint/Reason for Consult: muscle biopsy  HPI:  Emily Hicks is a 37 y.o. female PMH multiple sclerosis and lupus who presented to the ED 10/1 with complaints of lower extremity weakness. Patient states that in May she developed some lower extremity weakness that was initially felt to be due to an MS flare. Weakness improved, but then recurred a few weeks ago and has been progressive. Upon presentation to the ED she was noted to have an acute kidney injury and severely elevated CK. Nephrology and neurology are following. We are asked to see for consideration of a muscle biopsy. She has had nothing to eat/drink since midnight.  Anticoagulants: none   Family History  Problem Relation Age of Onset   Hypertension Father    Hypertension Mother    Hypertension Sister    Diabetes Maternal Grandmother    Diabetes Maternal Grandfather     Past Medical History:  Diagnosis Date   Cervical intraepithelial neoplasia III    Gallstones    GERD (gastroesophageal reflux disease)    only prn OTC occasionally   History of blood transfusion 2020   Lupus dx 2020   Multiple sclerosis (HCC) dx 2020   Numbness and tingling of left leg     Past Surgical History:  Procedure Laterality Date   CERVICAL CONIZATION W/BX N/A 01/16/2020   Procedure: CERVICAL CONE BIOPSY, ENDOCERVICAL CURETTAGE;  Surgeon: Theresia Majors, MD;  Location: [facility];  Service: Gynecology;  Laterality: N/A;  request 7:30am OR start in [place] [facility] block requests 45 minutes   CHOLECYSTECTOMY N/A 12/28/2013   Procedure: LAPAROSCOPIC CHOLECYSTECTOMY;  Surgeon: Claud Kelp, MD;  Location: [facility];  Service: General;  Laterality: N/A;    Social History:  reports that she has never smoked. She has never used smokeless tobacco. She reports that she does not drink alcohol and does not use  drugs.  Allergies: No Known Allergies  Facility-Administered Medications Prior to Admission  Medication Dose Route Frequency Provider Last Rate Last Admin   medroxyPROGESTERone (DEPO-PROVERA) injection 150 mg  150 mg Intramuscular Q90 days McDiarmid, Leighton Roach, MD   150 mg at 03/18/21 1435   Medications Prior to Admission  Medication Sig Dispense Refill   azathioprine (IMURAN) 100 MG tablet Take 150 mg by mouth daily.     clobetasol ointment (TEMOVATE) 0.05 % Apply 1 application  topically 2 (two) times daily as needed (as directed).     EXCEDRIN EXTRA STRENGTH 250-250-65 MG tablet Take 1 tablet by mouth every 6 (six) hours as needed for headache or migraine.     gabapentin (NEURONTIN) 300 MG capsule Take 1 capsule (300 mg total) by mouth 3 (three) times daily. 90 capsule 11   hydroxychloroquine (PLAQUENIL) 200 MG tablet Take 200 mg by mouth 2 (two) times daily.     ibuprofen (ADVIL) 200 MG tablet Take 200-400 mg by mouth every 6 (six) hours as needed for mild pain (or headaches).     ketoconazole (NIZORAL) 2 % shampoo Apply 1 Application topically 2 (two) times a week.     TYLENOL 325 MG CAPS Take 325-650 mg by mouth every 6 (six) hours as needed (for headaches or pain).     metoprolol succinate (TOPROL XL) 25 MG 24 hr tablet Take 1 tablet (25 mg total) by mouth at bedtime. (Patient not taking: Reported on 11/04/2022) 90 tablet 3   predniSONE (DELTASONE)  10 MG tablet Take 10-40 mg by mouth See admin instructions. Take 40 mg WITH FOOD by mouth once daily for 7 days then 30 mg once daily for 7 days then 20 mg once daily for 7 days then 10 mg once a day thereafter OR AS OTHERWISE INSTRUCTED (Patient not taking: Reported on 11/04/2022)     tamsulosin (FLOMAX) 0.4 MG CAPS capsule Take 1 capsule (0.4 mg total) by mouth daily. (Patient not taking: Reported on 11/04/2022) 30 capsule 11   Vitamin D, Ergocalciferol, (DRISDOL) 1.25 MG (50000 UNIT) CAPS capsule Take 1 capsule (50,000 Units total) by mouth every  7 (seven) days. (Patient not taking: Reported on 11/04/2022) 12 capsule 1    Prior to Admission medications   Medication Sig Start Date End Date Taking? Authorizing Provider  azathioprine (IMURAN) 100 MG tablet Take 150 mg by mouth daily.   Yes [provider]  clobetasol ointment (TEMOVATE) 0.05 % Apply 1 application  topically 2 (two) times daily as needed (as directed). 06/04/21  Yes [provider]  EXCEDRIN EXTRA STRENGTH [phone] MG tablet Take 1 tablet by mouth every 6 (six) hours as needed for headache or migraine.   Yes [provider]  gabapentin (NEURONTIN) 300 MG capsule Take 1 capsule (300 mg total) by mouth 3 (three) times daily. 08/20/22  Yes Sater, Pearletha Furl, MD  hydroxychloroquine (PLAQUENIL) 200 MG tablet Take 200 mg by mouth 2 (two) times daily.   Yes [provider]  ibuprofen (ADVIL) 200 MG tablet Take 200-400 mg by mouth every 6 (six) hours as needed for mild pain (or headaches).   Yes [provider]  ketoconazole (NIZORAL) 2 % shampoo Apply 1 Application topically 2 (two) times a week.   Yes [provider]  TYLENOL 325 MG CAPS Take 325-650 mg by mouth every 6 (six) hours as needed (for headaches or pain).   Yes [provider]  metoprolol succinate (TOPROL XL) 25 MG 24 hr tablet Take 1 tablet (25 mg total) by mouth at bedtime. Patient not taking: Reported on 11/04/2022 08/04/22   Riley Lam A, MD  predniSONE (DELTASONE) 10 MG tablet Take 10-40 mg by mouth See admin instructions. Take 40 mg WITH FOOD by mouth once daily for 7 days then 30 mg once daily for 7 days then 20 mg once daily for 7 days then 10 mg once a day thereafter OR AS OTHERWISE INSTRUCTED Patient not taking: Reported on 11/04/2022 06/02/22   [provider]  tamsulosin (FLOMAX) 0.4 MG CAPS capsule Take 1 capsule (0.4 mg total) by mouth daily. Patient not taking: Reported on 11/04/2022 07/28/22   Sater, Pearletha Furl, MD  Vitamin D,  Ergocalciferol, (DRISDOL) 1.25 MG (50000 UNIT) CAPS capsule Take 1 capsule (50,000 Units total) by mouth every 7 (seven) days. Patient not taking: Reported on 11/04/2022 08/16/21   Marcine Matar, MD    Blood pressure 135/89, pulse 83, temperature 98 F (36.7 C), temperature source Oral, resp. rate 17, height 5\' 1"  (1.549 m), weight 93.4 kg, SpO2 99%. Physical Exam: General: pleasant, WD/WN female who is laying in bed in NAD HEENT: head is normocephalic, atraumatic.  Sclera are noninjected.  Pupils equal and round.  Ears and nose without any masses or lesions.  Mouth is pink and moist. Dentition fair Heart: regular, rate, and rhythm  Lungs: CTAB, no wheezes, rhonchi, or rales noted.  Respiratory effort nonlabored on room air Abd: soft, NT/ND MS: extremities WWP without gross deformity. Calves soft and nontender Skin:  warm and dry with no masses, lesions, or rashes Psych: A&Ox4 with an appropriate affect Neuro: weakness noted to BLE, sensation intact  Results for orders placed or performed during the hospital encounter of 11/04/22 (from the past 48 hour(s))  CBC     Status: Abnormal   Collection Time: 11/04/22  6:11 PM  Result Value Ref Range   WBC 3.3 (L) 4.0 - 10.5 K/uL   RBC 4.07 3.87 - 5.11 MIL/uL   Hemoglobin 10.9 (L) 12.0 - 15.0 g/dL   HCT 02.7 (L) 25.3 - 66.4 %   MCV 84.8 80.0 - 100.0 fL   MCH 26.8 26.0 - 34.0 pg   MCHC 31.6 30.0 - 36.0 g/dL   RDW 40.3 47.4 - 25.9 %   Platelets 185 150 - 400 K/uL   nRBC 0.0 0.0 - 0.2 %    Comment: Performed at [facility], [address], [place], [place] [address]  Urinalysis, Routine w reflex microscopic -Urine, Clean Catch     Status: Abnormal   Collection Time: 11/04/22  6:11 PM  Result Value Ref Range   Color, Urine YELLOW YELLOW   APPearance HAZY (A) CLEAR   Specific Gravity, Urine 1.021 1.005 - 1.030   pH 5.0 5.0 - 8.0   Glucose, UA NEGATIVE NEGATIVE mg/dL   Hgb urine dipstick LARGE (A) NEGATIVE   Bilirubin  Urine NEGATIVE NEGATIVE   Ketones, ur NEGATIVE NEGATIVE mg/dL   Protein, ur 564 (A) NEGATIVE mg/dL   Nitrite NEGATIVE NEGATIVE   Leukocytes,Ua TRACE (A) NEGATIVE   RBC / HPF 0-5 0 - 5 RBC/hpf   WBC, UA 6-10 0 - 5 WBC/hpf   Bacteria, UA RARE (A) NONE SEEN   Squamous Epithelial / HPF 0-5 0 - 5 /HPF   Mucus PRESENT    Hyaline Casts, UA PRESENT    Granular Casts, UA PRESENT    Amorphous Crystal PRESENT     Comment: Performed at [facility], [address], [place], [place] [address]  hCG, serum, qualitative     Status: None   Collection Time: 11/04/22  6:11 PM  Result Value Ref Range   Preg, Serum NEGATIVE NEGATIVE    Comment:        THE SENSITIVITY OF THIS METHODOLOGY IS >10 mIU/mL. Performed at [facility], [address] [place], [place] [address]   Comprehensive metabolic panel     Status: Abnormal   Collection Time: 11/04/22  6:11 PM  Result Value Ref Range   Sodium 138 135 - 145 mmol/L   Potassium 3.6 3.5 - 5.1 mmol/L   Chloride 104 98 - 111 mmol/L   CO2 20 (L) 22 - 32 mmol/L   Glucose, Bld 97 70 - 99 mg/dL    Comment: Glucose reference range applies only to samples taken after fasting for at least 8 hours.   BUN 57 (H) 6 - 20 mg/dL   Creatinine, Ser 6.60 (H) 0.44 - 1.00 mg/dL   Calcium 8.9 8.9 - 63.0 mg/dL   Total Protein 7.3 6.5 - 8.1 g/dL   Albumin 3.0 (L) 3.5 - 5.0 g/dL   AST 160 (H) 15 - 41 U/L   ALT 105 (H) 0 - 44 U/L   Alkaline Phosphatase 39 38 - 126 U/L   Total Bilirubin 0.6 0.3 - 1.2 mg/dL   GFR, Estimated 28 (L) >60 mL/min    Comment: (NOTE) Calculated using the CKD-EPI Creatinine Equation (2021)    Anion gap 14 5 -  15    Comment: Performed at [facility], [address], [place], [place] [address]  CK     Status: Abnormal   Collection Time: 11/04/22 10:43 PM  Result Value Ref Range   Total CK 23,975 (H) 38 - 234 U/L    Comment: RESULT CONFIRMED BY MANUAL DILUTION Performed at [facility], [address] [place], [place] [address]   Magnesium     Status: None   Collection Time: 11/04/22 10:43 PM  Result Value Ref Range   Magnesium 2.1 1.7 - 2.4 mg/dL    Comment: Performed at [facility], [address] [place], [place] [address]  Phosphorus     Status: None   Collection Time: 11/04/22 10:43 PM  Result Value Ref Range   Phosphorus 3.4 2.5 - 4.6 mg/dL    Comment: Performed at [facility], [address] [place], [place] [address]  Osmolality     Status: Abnormal   Collection Time: 11/04/22 10:43 PM  Result Value Ref Range   Osmolality 316 (H) 275 - 295 mOsm/kg    Comment: PERFORMED AT [facility] Performed at [facility], [address] [place] [address], [place], [place] [address]   Troponin I (High Sensitivity)     Status: None   Collection Time: 11/04/22 10:43 PM  Result Value Ref Range   Troponin I (High Sensitivity) 13 <18 ng/L    Comment: (NOTE) Elevated high sensitivity troponin I (hsTnI) values and significant  changes across serial measurements may suggest ACS but many other  chronic and acute conditions are known to elevate hsTnI results.  Refer to the "Links" section for chest pain algorithms and additional  guidance. Performed at [facility], [address], [place], [place] [address]   TSH     Status: None   Collection Time: 11/04/22 10:43 PM  Result Value Ref Range   TSH 0.661 0.350 - 4.500 uIU/mL    Comment: Performed by a 3rd Generation assay with a functional sensitivity of <=0.01 uIU/mL. Performed at [facility], [address] [place], [place] [address]   Procalcitonin     Status: None   Collection Time: 11/04/22 10:43 PM  Result Value Ref Range   Procalcitonin 0.67 ng/mL    Comment:        Interpretation: PCT > 0.5 ng/mL and <= 2 ng/mL: Systemic infection (sepsis) is possible, but other conditions are known to elevate PCT as  well. (NOTE)       Sepsis PCT Algorithm           Lower Respiratory Tract                                      Infection PCT Algorithm    ----------------------------     ----------------------------         PCT < 0.25 ng/mL                PCT < 0.10 ng/mL          Strongly encourage             Strongly discourage   discontinuation of antibiotics    initiation of antibiotics    ----------------------------     -----------------------------       PCT 0.25 - 0.50 ng/mL  PCT 0.10 - 0.25 ng/mL               OR       >80% decrease in PCT            Discourage initiation of                                            antibiotics      Encourage discontinuation           of antibiotics    ----------------------------     -----------------------------         PCT >= 0.50 ng/mL              PCT 0.26 - 0.50 ng/mL                AND       <80% decrease in PCT             Encourage initiation of                                             antibiotics       Encourage continuation           of antibiotics    ----------------------------     -----------------------------        PCT >= 0.50 ng/mL                  PCT > 0.50 ng/mL               AND         increase in PCT                  Strongly encourage                                      initiation of antibiotics    Strongly encourage escalation           of antibiotics                                     -----------------------------                                           PCT <= 0.25 ng/mL                                                 OR                                        > 80% decrease in PCT  Discontinue / Do not initiate                                             antibiotics  Performed at [facility], [address] [place], [place] [address]   Iron and TIBC     Status: Abnormal   Collection Time: 11/04/22 10:43 PM  Result Value Ref Range   Iron 24 (L) 28  - 170 ug/dL   TIBC 564 (L) 332 - 951 ug/dL   Saturation Ratios 11 10.4 - 31.8 %   UIBC 186 ug/dL    Comment: Performed at [facility], [address] [place], [place] [address]  Ferritin     Status: Abnormal   Collection Time: 11/04/22 10:43 PM  Result Value Ref Range   Ferritin 721 (H) 11 - 307 ng/mL    Comment: Performed at [facility], [address] [place], [place] [address]  Reticulocytes     Status: Abnormal   Collection Time: 11/04/22 10:43 PM  Result Value Ref Range   Retic Ct Pct <0.4 (L) 0.4 - 3.1 %    Comment: REPEATED TO VERIFY   RBC. 4.06 3.87 - 5.11 MIL/uL   Retic Count, Absolute Not Measured 19.0 - 186.0 K/uL   Immature Retic Fract 4.9 2.3 - 15.9 %    Comment: Performed at [facility], [address], [place], [place] [address]  Blood gas, venous     Status: Abnormal   Collection Time: 11/04/22 10:43 PM  Result Value Ref Range   pH, Ven 7.33 7.25 - 7.43   pCO2, Ven 40 (L) 44 - 60 mmHg   pO2, Ven 35 32 - 45 mmHg   Bicarbonate 21.1 20.0 - 28.0 mmol/L   Acid-base deficit 4.5 (H) 0.0 - 2.0 mmol/L   O2 Saturation 56.1 %   Patient temperature 37.0     Comment: Performed at [facility], [address], [place], [place] [address]  C3 complement     Status: Abnormal   Collection Time: 11/04/22 10:43 PM  Result Value Ref Range   C3 Complement 68 (L) 82 - 167 mg/dL    Comment: (NOTE) Performed At: [facility] [address] [place], [place] [id number] Jolene Schimke MD [id number]   C4 complement     Status: Abnormal   Collection Time: 11/04/22 10:43 PM  Result Value Ref Range   Complement C4, Body Fluid 5 (L) 12 - 38 mg/dL    Comment: (NOTE) Performed At: [facility] [address] [place], [place] [id number] Jolene Schimke MD [id number]   Sedimentation rate     Status: Abnormal   Collection Time: 11/04/22 10:43 PM  Result Value Ref Range   Sed Rate 90 (H)  0 - 22 mm/hr    Comment: Performed at [facility], [address], [place], [place] [address]  C-reactive protein     Status: Abnormal   Collection Time: 11/04/22 10:43 PM  Result Value Ref Range   CRP 7.3 (H) <1.0 mg/dL    Comment: Performed at [facility], [address], [place], [place] [address]  SARS Coronavirus 2 by RT PCR (hospital order, performed in [facility] hospital lab) *cepheid single result test* Anterior Nasal Swab     Status: None   Collection Time: 11/04/22 10:50 PM  Specimen: Anterior Nasal Swab  Result Value Ref Range   SARS Coronavirus 2 by RT PCR NEGATIVE NEGATIVE    Comment: (NOTE) SARS-CoV-2 target nucleic acids are NOT DETECTED.  The SARS-CoV-2 RNA is generally detectable in upper and lower respiratory specimens during the acute phase of infection. The lowest concentration of SARS-CoV-2 viral copies this assay can detect is 250 copies / mL. A negative result does not preclude SARS-CoV-2 infection and should not be used as the sole basis for treatment or other patient management decisions.  A negative result may occur with improper specimen collection / handling, submission of specimen other than nasopharyngeal swab, presence of viral mutation(s) within the areas targeted by this assay, and inadequate number of viral copies (<250 copies / mL). A negative result must be combined with clinical observations, patient history, and epidemiological information.  Fact Sheet for Patients:   RoadLapTop.co.za  Fact Sheet for Healthcare Providers: http://kim-miller.com/  This test is not yet approved or  cleared by the [place] FDA and has been authorized for detection and/or diagnosis of SARS-CoV-2 by FDA under an Emergency Use Authorization (EUA).  This EUA will remain in effect (meaning this test can be used) for the duration of the COVID-19 declaration under Section 564(b)(1) of the  Act, 21 U.S.C. section 360bbb-3(b)(1), unless the authorization is terminated or revoked sooner.  Performed at [facility], [address], [place], [place] [address]   Prealbumin     Status: Abnormal   Collection Time: 11/05/22  5:34 AM  Result Value Ref Range   Prealbumin 6 (L) 18 - 38 mg/dL    Comment: Performed at [facility], [address] [place], [place] [address]  Vitamin B12     Status: None   Collection Time: 11/05/22  5:34 AM  Result Value Ref Range   Vitamin B-12 309 180 - 914 pg/mL    Comment: (NOTE) This assay is not validated for testing neonatal or myeloproliferative syndrome specimens for Vitamin B12 levels. Performed at [facility], [address], [place], [place] [address]   Folate     Status: None   Collection Time: 11/05/22  5:34 AM  Result Value Ref Range   Folate 9.6 >5.9 ng/mL    Comment: Performed at [facility], [address], [place], [place] [address]  Anti-DNA antibody, double-stranded     Status: Abnormal   Collection Time: 11/05/22  5:34 AM  Result Value Ref Range   ds DNA Ab 62 (H) 0 - 9 IU/mL    Comment: (NOTE)                                   Negative      <5                                   Equivocal  5 - 9                                   Positive      >9 Performed At: [facility] [address] [place], [place] [id number] Jolene Schimke MD [id number]   Hepatitis panel, acute     Status: None   Collection Time: 11/05/22  5:34 AM  Result Value  Ref Range   Hepatitis B Surface Ag NON REACTIVE NON REACTIVE   HCV Ab NON REACTIVE NON REACTIVE    Comment: (NOTE) Nonreactive HCV antibody screen is consistent with no HCV infections,  unless recent infection is suspected or other evidence exists to indicate HCV infection.     Hep A IgM NON REACTIVE NON REACTIVE   Hep B C IgM NON REACTIVE NON REACTIVE    Comment: Performed at [facility], [address], [place], [place]  [address]  Magnesium     Status: None   Collection Time: 11/05/22  5:34 AM  Result Value Ref Range   Magnesium 2.1 1.7 - 2.4 mg/dL    Comment: Performed at [facility], [address] [place], [place] [address]  Phosphorus     Status: None   Collection Time: 11/05/22  5:34 AM  Result Value Ref Range   Phosphorus 3.5 2.5 - 4.6 mg/dL    Comment: Performed at [facility], [address], [place], [place] [address]  Comprehensive metabolic panel     Status: Abnormal   Collection Time: 11/05/22  5:34 AM  Result Value Ref Range   Sodium 138 135 - 145 mmol/L   Potassium 3.4 (L) 3.5 - 5.1 mmol/L   Chloride 110 98 - 111 mmol/L   CO2 20 (L) 22 - 32 mmol/L   Glucose, Bld 105 (H) 70 - 99 mg/dL    Comment: Glucose reference range applies only to samples taken after fasting for at least 8 hours.   BUN 49 (H) 6 - 20 mg/dL   Creatinine, Ser 7.84 (H) 0.44 - 1.00 mg/dL   Calcium 8.2 (L) 8.9 - 10.3 mg/dL   Total Protein 5.9 (L) 6.5 - 8.1 g/dL   Albumin 2.2 (L) 3.5 - 5.0 g/dL   AST 696 (H) 15 - 41 U/L   ALT 88 (H) 0 - 44 U/L   Alkaline Phosphatase 35 (L) 38 - 126 U/L   Total Bilirubin 0.3 0.3 - 1.2 mg/dL   GFR, Estimated 36 (L) >60 mL/min    Comment: (NOTE) Calculated using the CKD-EPI Creatinine Equation (2021)    Anion gap 8 5 - 15    Comment: Performed at [facility], [address] [place], [place] [address]  CBC     Status: Abnormal   Collection Time: 11/05/22  5:34 AM  Result Value Ref Range   WBC 2.7 (L) 4.0 - 10.5 K/uL   RBC 3.58 (L) 3.87 - 5.11 MIL/uL   Hemoglobin 9.4 (L) 12.0 - 15.0 g/dL   HCT 41.3 (L) 24.4 - 01.0 %   MCV 83.0 80.0 - 100.0 fL   MCH 26.3 26.0 - 34.0 pg   MCHC 31.6 30.0 - 36.0 g/dL   RDW 27.2 53.6 - 64.4 %   Platelets 189 150 - 400 K/uL   nRBC 0.0 0.0 - 0.2 %    Comment: Performed at [facility], [address], [place], [place] [address]  CK     Status: Abnormal   Collection Time: 11/05/22  5:34 AM  Result Value Ref Range   Total CK  49,442 (H) 38 - 234 U/L    Comment: RESULT CONFIRMED BY MANUAL DILUTION Performed at [facility], [address], [place], [place] [address]   Aldolase     Status: Abnormal   Collection Time: 11/05/22  5:34 AM  Result Value Ref Range   Aldolase 45.6 (H) 3.3 -  10.3 U/L    Comment: (NOTE) **Results verified by repeat testing** Performed At: [facility] [address] [place], [place] [id number] Jolene Schimke MD [id number]   Osmolality, urine     Status: None   Collection Time: 11/05/22 12:40 PM  Result Value Ref Range   Osmolality, Ur 597 300 - 900 mOsm/kg    Comment: Performed at [facility], [address], [place], [place] [address]  Creatinine, urine, random     Status: None   Collection Time: 11/05/22 12:40 PM  Result Value Ref Range   Creatinine, Urine 159 mg/dL    Comment: Performed at [facility], [address] [place], [place] [address]  Sodium, urine, random     Status: None   Collection Time: 11/05/22 12:40 PM  Result Value Ref Range   Sodium, Ur 30 mmol/L    Comment: Performed at [facility], [address], [place], [place] [address]  Urinalysis, Complete w Microscopic -Urine, Clean Catch     Status: Abnormal   Collection Time: 11/05/22 12:40 PM  Result Value Ref Range   Color, Urine YELLOW YELLOW   APPearance HAZY (A) CLEAR   Specific Gravity, Urine 1.017 1.005 - 1.030   pH 6.0 5.0 - 8.0   Glucose, UA NEGATIVE NEGATIVE mg/dL   Hgb urine dipstick MODERATE (A) NEGATIVE   Bilirubin Urine NEGATIVE NEGATIVE   Ketones, ur NEGATIVE NEGATIVE mg/dL   Protein, ur 30 (A) NEGATIVE mg/dL   Nitrite NEGATIVE NEGATIVE   Leukocytes,Ua NEGATIVE NEGATIVE   RBC / HPF 0-5 0 - 5 RBC/hpf   WBC, UA 0-5 0 - 5 WBC/hpf   Bacteria, UA RARE (A) NONE SEEN   Squamous Epithelial / HPF 6-10 0 - 5 /HPF    Comment: Performed at [facility], [address] [place], [place] [address]  Rapid urine drug screen (hospital performed)     Status:  None   Collection Time: 11/05/22 12:40 PM  Result Value Ref Range   Opiates NONE DETECTED NONE DETECTED   Cocaine NONE DETECTED NONE DETECTED   Benzodiazepines NONE DETECTED NONE DETECTED   Amphetamines NONE DETECTED NONE DETECTED   Tetrahydrocannabinol NONE DETECTED NONE DETECTED   Barbiturates NONE DETECTED NONE DETECTED    Comment: (NOTE) DRUG SCREEN FOR MEDICAL PURPOSES ONLY.  IF CONFIRMATION IS NEEDED FOR ANY PURPOSE, NOTIFY LAB WITHIN 5 DAYS.  LOWEST DETECTABLE LIMITS FOR URINE DRUG SCREEN Drug Class                     Cutoff (ng/mL) Amphetamine and metabolites    1000 Barbiturate and metabolites    200 Benzodiazepine                 200 Opiates and metabolites        300 Cocaine and metabolites        300 THC                            50 Performed at [facility], [address] [place], [place] [address]   CK     Status: Abnormal   Collection Time: 11/05/22  1:15 PM  Result Value Ref Range   Total CK 10,899 (H) 38 - 234 U/L    Comment: RESULT CONFIRMED BY MANUAL DILUTION Performed at [facility], [address] [place], [place] [address]   C-reactive protein  Status: Abnormal   Collection Time: 11/05/22  1:15 PM  Result Value Ref Range   CRP 5.2 (H) <1.0 mg/dL    Comment: Performed at [facility], [address] [place], [place] [address]  CK     Status: Abnormal   Collection Time: 11/05/22  9:57 PM  Result Value Ref Range   Total CK 10,783 (H) 38 - 234 U/L    Comment: RESULT CONFIRMED BY MANUAL DILUTION Performed at [facility], [address], [place], [place] [address]   CK     Status: Abnormal   Collection Time: 11/06/22  6:20 AM  Result Value Ref Range   Total CK 8,947 (H) 38 - 234 U/L    Comment: RESULT CONFIRMED BY MANUAL DILUTION Performed at [facility], [address] [place], [place] [address]   Comprehensive metabolic panel     Status: Abnormal   Collection Time: 11/06/22  6:20 AM  Result Value Ref  Range   Sodium 142 135 - 145 mmol/L   Potassium 4.1 3.5 - 5.1 mmol/L   Chloride 118 (H) 98 - 111 mmol/L   CO2 17 (L) 22 - 32 mmol/L   Glucose, Bld 94 70 - 99 mg/dL    Comment: Glucose reference range applies only to samples taken after fasting for at least 8 hours.   BUN 26 (H) 6 - 20 mg/dL   Creatinine, Ser 1.32 (H) 0.44 - 1.00 mg/dL   Calcium 7.9 (L) 8.9 - 10.3 mg/dL   Total Protein 5.2 (L) 6.5 - 8.1 g/dL   Albumin 2.0 (L) 3.5 - 5.0 g/dL   AST 440 (H) 15 - 41 U/L   ALT 70 (H) 0 - 44 U/L   Alkaline Phosphatase 32 (L) 38 - 126 U/L   Total Bilirubin 0.4 0.3 - 1.2 mg/dL   GFR, Estimated 46 (L) >60 mL/min    Comment: (NOTE) Calculated using the CKD-EPI Creatinine Equation (2021)    Anion gap 7 5 - 15    Comment: Performed at [facility], [address] [place], [place] [address]  Magnesium     Status: None   Collection Time: 11/06/22  6:20 AM  Result Value Ref Range   Magnesium 1.8 1.7 - 2.4 mg/dL    Comment: Performed at [facility], [address] [place], [place] [address]  Phosphorus     Status: None   Collection Time: 11/06/22  6:20 AM  Result Value Ref Range   Phosphorus 2.6 2.5 - 4.6 mg/dL    Comment: Performed at [facility], [address] [place], [place] [address]  CBC     Status: Abnormal   Collection Time: 11/06/22  6:20 AM  Result Value Ref Range   WBC 2.6 (L) 4.0 - 10.5 K/uL   RBC 3.48 (L) 3.87 - 5.11 MIL/uL   Hemoglobin 9.0 (L) 12.0 - 15.0 g/dL   HCT 42.5 (L) 95.6 - 38.7 %   MCV 84.5 80.0 - 100.0 fL   MCH 25.9 (L) 26.0 - 34.0 pg   MCHC 30.6 30.0 - 36.0 g/dL   RDW 56.4 33.2 - 95.1 %   Platelets 278 150 - 400 K/uL   nRBC 0.0 0.0 - 0.2 %    Comment: Performed at [facility], [address] [place], [place] [address]   MR FEMUR LEFT WO CONTRAST  Result Date: 11/05/2022 CLINICAL DATA:  Bilateral hip with thigh and leg pain. History  of systemic lupus erythematosus and multiple sclerosis. Evaluate for myositis. EXAM: MR OF THE  LEFT FEMUR WITHOUT CONTRAST; MRI OF THE RIGHT FEMUR WITHOUT CONTRAST TECHNIQUE: Multiplanar, multisequence MR imaging of both thighs was performed. No intravenous contrast was administered. COMPARISON:  Pelvic CT 06/16/2022. FINDINGS: Pelvic MRI findings dictated separately. Bones/Joint/Cartilage No evidence of acute fracture, dislocation or osteonecrosis. No significant hip joint effusions. Moderate size nearly symmetric knee joint effusions bilaterally. Ligaments Examination not tailored for ligamentous evaluation. No ligamentous abnormalities identified. Muscles and Tendons There is severe, nearly symmetric T2 hyperintensity throughout the musculature of the lower pelvis and proximal thighs. Pelvic findings are dictated separately. There is diffuse involvement of the hip adductor musculature bilaterally. There is also lesser fairly symmetric involvement of the vastus intermedius muscles bilaterally. There is asymmetric involvement of the left semitendinosus and biceps muscles. No focal atrophy, fluid collection or tendon tear identified. Soft tissues No significant subcutaneous inflammatory changes or focal fluid collections. As described on separate pelvic MRI, there is bilateral external and inguinal lymphadenopathy which has progressed from previous CT. IMPRESSION: 1. Severe, nearly symmetric T2 hyperintensity throughout the musculature of the lower pelvis and proximal thighs bilaterally consistent with myositis. There is asymmetric involvement of the left semitendinosus and biceps muscles. 2. No focal muscular atrophy or fluid collection identified. 3. No acute osseous findings. 4. Bilateral external and inguinal lymphadenopathy, further discussed on pelvic MRI. Electronically Signed   By: Carey Bullocks M.D.   On: 11/05/2022 16:54   MR [id number] RIGHT WO CONTRAST  Result Date: 11/05/2022 CLINICAL DATA:  Bilateral hip with thigh and leg pain. History of systemic lupus erythematosus and multiple sclerosis.  Evaluate for myositis. EXAM: MR OF THE LEFT FEMUR WITHOUT CONTRAST; MRI OF THE RIGHT FEMUR WITHOUT CONTRAST TECHNIQUE: Multiplanar, multisequence MR imaging of both thighs was performed. No intravenous contrast was administered. COMPARISON:  Pelvic CT 06/16/2022. FINDINGS: Pelvic MRI findings dictated separately. Bones/Joint/Cartilage No evidence of acute fracture, dislocation or osteonecrosis. No significant hip joint effusions. Moderate size nearly symmetric knee joint effusions bilaterally. Ligaments Examination not tailored for ligamentous evaluation. No ligamentous abnormalities identified. Muscles and Tendons There is severe, nearly symmetric T2 hyperintensity throughout the musculature of the lower pelvis and proximal thighs. Pelvic findings are dictated separately. There is diffuse involvement of the hip adductor musculature bilaterally. There is also lesser fairly symmetric involvement of the vastus intermedius muscles bilaterally. There is asymmetric involvement of the left semitendinosus and biceps muscles. No focal atrophy, fluid collection or tendon tear identified. Soft tissues No significant subcutaneous inflammatory changes or focal fluid collections. As described on separate pelvic MRI, there is bilateral external and inguinal lymphadenopathy which has progressed from previous CT. IMPRESSION: 1. Severe, nearly symmetric T2 hyperintensity throughout the musculature of the lower pelvis and proximal thighs bilaterally consistent with myositis. There is asymmetric involvement of the left semitendinosus and biceps muscles. 2. No focal muscular atrophy or fluid collection identified. 3. No acute osseous findings. 4. Bilateral external and inguinal lymphadenopathy, further discussed on pelvic MRI. Electronically Signed   By: Carey Bullocks M.D.   On: 11/05/2022 16:54   MR PELVIS WO CONTRAST  Result Date: 11/05/2022 CLINICAL DATA:  Bilateral hip with thigh and leg pain. History of systemic lupus  erythematosus and multiple sclerosis. Evaluate for myositis. EXAM: MRI PELVIS WITHOUT CONTRAST TECHNIQUE: Multiplanar multisequence MR imaging of the pelvis was performed. No intravenous contrast was administered. COMPARISON:  Abdominopelvic CT 06/16/2022. FINDINGS: Bilateral thigh studies are dictated separately. Urinary Tract: The visualized distal ureters  and bladder appear unremarkable. Bowel: No bowel wall thickening, distention or surrounding inflammation identified within the pelvis. Mildly prominent stool within the distal colon. Vascular/Lymphatic: Persistent bilateral external iliac and inguinal adenopathy, progressive from prior CT. Representative nodes include a right external iliac node measuring 1.1 cm short axis on image 29/6, a right inguinal node measuring 1.2 cm on image 38/6 and a left external iliac node measuring 1.5 cm on image 30/6. No significant vascular findings on noncontrast imaging. Reproductive: The uterus and ovaries appear unremarkable. No adnexal mass. Other: No significant pelvic ascites. Musculoskeletal: No acute or worrisome osseous findings. Mildly heterogeneous marrow signal. The hip and sacroiliac joints appear unremarkable. No significant hip joint effusion. There is severe nearly symmetric T2 hyperintensity throughout the visualized musculature, including the psoas, iliacus, pelvic sidewall, gluteus and adductor musculature of both thighs. No focal fluid collection, focal muscular atrophy or acute tendon abnormalities are identified. IMPRESSION: 1. Severe, nearly symmetric T2 hyperintensity throughout the visualized musculature of the pelvis and thighs, consistent with multifocal myositis. No focal fluid collection or focal muscular atrophy identified. 2. Persistent nonspecific bilateral external iliac and inguinal adenopathy, progressive from prior CT. Cannot exclude lymphoproliferative process. 3. No acute osseous findings or significant hip arthropathy. 4. Bilateral thigh  studies are dictated separately. Electronically Signed   By: Carey Bullocks M.D.   On: 11/05/2022 16:42   US RENAL  Result Date: 11/04/2022 CLINICAL DATA:  Acute kidney injury EXAM: RENAL / URINARY TRACT ULTRASOUND COMPLETE COMPARISON:  CT 06/16/2022 FINDINGS: Right Kidney: Renal measurements: 11.5 x 4.8 x 5.7 cm = volume: 165 ML. Mildly increased echotexture. No mass or hydronephrosis. Left Kidney: Renal measurements: 12.7 x 6.3 x 5.7 cm = volume: 238 mL. Mildly increased echotexture. No mass or hydronephrosis. Bladder: Appears normal for degree of bladder distention. Other: None. IMPRESSION: No acute findings.  No hydronephrosis. Increased echotexture within the kidneys compatible with chronic medical renal disease. Electronically Signed   By: Charlett Nose M.D.   On: 11/04/2022 23:47   US Abdomen Limited RUQ (LIVER/GB)  Result Date: 11/04/2022 CLINICAL DATA:  Elevated LFTs. EXAM: ULTRASOUND ABDOMEN LIMITED RIGHT UPPER QUADRANT COMPARISON:  Right upper quadrant ultrasound dated 10/30/2010. FINDINGS: Gallbladder: Cholecystectomy Common bile duct: Diameter: 2 mm Liver: The liver is unremarkable as visualized. Portal vein is patent on color Doppler imaging with normal direction of blood flow towards the liver. Other: None. IMPRESSION: Cholecystectomy, otherwise unremarkable right upper quadrant ultrasound. Electronically Signed   By: Elgie Collard M.D.   On: 11/04/2022 23:23   DG Abd 1 View  Result Date: 11/04/2022 CLINICAL DATA:  Nausea and abdominal pain EXAM: ABDOMEN - 1 VIEW COMPARISON:  None Available. FINDINGS: Scattered large and small bowel gas is noted. No obstructive changes are noted. Changes of prior cholecystectomy are seen. No free air is noted. No bony abnormality is noted. IMPRESSION: No acute abnormality seen. Electronically Signed   By: Alcide Clever M.D.   On: 11/04/2022 22:45   DG Chest Port 1 View  Result Date: 11/04/2022 CLINICAL DATA:  Weakness EXAM: PORTABLE CHEST 1 VIEW  COMPARISON:  06/16/2018 FINDINGS: The heart size and mediastinal contours are within normal limits. Both lungs are clear. The visualized skeletal structures are unremarkable. IMPRESSION: No active disease. Electronically Signed   By: Alcide Clever M.D.   On: 11/04/2022 22:44      Assessment/Plan Bilateral lower extremity weakness - Patient with h/o lupus and MS presents with progressive BLE weakness, AKI, and elevated CK. We are asked to see  for consideration of muscle biopsy. The planned procedure as well as risks and benefits were discussed with the patient. Risks include but are not limited to anesthesia, pain, bleeding, infection. The patient's questions were answered to their satisfaction, they voiced understanding and elected to proceed with surgery. Keep NPO. Plan for surgery later today.  ID - [id number] on call to OR VTE - SCDs, hold chemical dvt ppx for surgery FEN - NPO Foley - none   I reviewed Consultant neurology, nephrology notes, hospitalist notes, last 24 h vitals and pain scores, last 48 h intake and output, last 24 h labs and trends, and last 24 h imaging results.   Franne Forts, [facility] 11/06/2022, 9:40 AM Please see Amion for pager number during day hours 7:00am-4:30pm

## 2022-11-06 NOTE — Progress Notes (Signed)
PROGRESS NOTE  Emily Hicks JYN:[id number] DOB: 1985-10-06   PCP: Patient, No Pcp Per  Patient is from: Home.  DOA: 11/04/2022 LOS: 2  Chief complaints Chief Complaint  Patient presents with   Weakness     Brief Narrative / Interim history: 37 year old F with PMH of MS, lupus pericarditis, pancytopenia and obesity presenting with generalized weakness and progressive lower extremity weakness with pain for about 2 weeks, and admitted with working diagnosis of SLE, AKI and rhabdomyolysis.  Per patient, symptoms felt like lupus flare.  In ED, vital stable. Cr 2.24 (baseline about 0.6).  BUN 57.  AST 391.  ALT 105.  CK [id number].  Troponin negative.  CRP 7.3.  WBC 3.3.  Hgb 10.9.  UA without significant finding other than large Hgb without significant RBC.  CXR and KUB without significant finding.  Renal US ordered.  Neurology and nephrology consulted.  Patient was started on IV fluid.   Renal ultrasound without acute finding.  CK trended up to 49,400.  Nephrology discussed with patient's rheumatologist who recommended muscle biopsy and steroid.  MRI pelvis and bilateral femur multifocal myositis, persistent and nonspecific bilateral external iliac and inguinal LAD that has progressed from prior CT. patient underwent muscle biopsy by general surgery on 10/3.  Subjective: Seen and examined earlier this morning before she went for procedure.  No major events overnight of this morning.  No complaints.  Denies pain, numbness or tingling.  Reports improvement in weakness.  She says she was able to walk in the hallway yesterday.  CK improved to 9000.  Renal function improved as well.  Objective: Vitals:   11/06/22 1012 11/06/22 1215 11/06/22 1230 11/06/22 1245  BP: (!) 151/95 121/85 (!) 134/95 (!) 143/94  Pulse: 82 81 79 74  Resp: 16 19 19  (!) 23  Temp: 98.5 F (36.9 C) 97.9 F (36.6 C)  97.9 F (36.6 C)  TempSrc: Oral     SpO2: 100% 98% 94% 98%  Weight:      Height:         Examination:  GENERAL: No apparent distress.  Nontoxic. HEENT: MMM.  Vision and hearing grossly intact.  NECK: Supple.  No apparent JVD.  RESP:  No IWOB.  Fair aeration bilaterally. CVS:  RRR. Heart sounds normal.  ABD/GI/GU: BS+. Abd soft, NTND.  MSK/EXT: BLE weakness, right slightly greater than left SKIN: no apparent skin lesion or wound NEURO: Awake, alert and oriented appropriately.  BLE weakness, right slightly greater than left PSYCH: Calm. Normal affect.   Procedures:  10/3-muscle biopsy by general surgery  Microbiology summarized: COVID-19 PCR nonreactive Urine culture with multiple species.  Assessment and plan: Active Problems:   SLE (systemic lupus erythematosus related syndrome) (HCC)   Normocytic anemia   Obesity, Class I, BMI 30.0-34.9 (see actual BMI)   Multiple sclerosis (HCC)   Elevated liver enzymes   Essential hypertension   AKI (acute kidney injury) (HCC)   Rhabdomyolysis   Generalized and lower extremity weakness and pain: Likely due to rhabdo and myositis.  No report of trauma, exertion or excessive dehydration.  MRI pelvis and bilateral femur showed myositis.  Inflammatory markers elevated.  Complement levels low.  dsDNA slightly elevated.  She reports some improvement.  No significant pain, numbness or tingling.  No chest pain or dyspnea.  CK peaked at 49,000 and trended down to 9000. -Appreciate help by nephrology, neurology, rheumatology and surgery -Continue IVF at 250 cc an hour -Defer steroid to neurology -Follow biopsy results  Nontraumatic  rhabdomyolysis: Concerned about myopathy from lupus flareup.  MRI pelvis and bilateral femur concerning for severe myositis.  Inflammatory markers elevated.  Improving. -Management as above -Continue monitoring  SLE with possible flare: On Plaquenil, azathioprine and Saphnelo outpatient.  Recently finished steroid course.  Reportedly she was on CellCept in the past but it stopped working.  Complement  levels low.  ds DNA slightly elevated. -Continue Plaquenil and Imuran -Defer steroid to neurology/rheumatology  AKI: Could be due to rhabdo.  Renal US without acute finding.  Low suspicion for lupus nephritis per nephrology Recent Labs    06/11/22 1135 06/16/22 1433 06/16/22 1835 06/17/22 0142 11/04/22 1811 11/05/22 0534 11/06/22 0620  BUN 9 9  --  12 57* 49* 26*  CREATININE 0.76 0.79 0.74 0.65 2.24* 1.82* 1.51*  -IV fluid as above -Nephrology on board   Normocytic anemia: Anemia panel consistent with anemia of chronic disease.  Drop in Hgb likely dilutional Recent Labs    06/11/22 1135 06/16/22 1433 06/16/22 1835 06/17/22 0142 11/04/22 1811 11/05/22 0534 11/06/22 0620  HGB 12.1 11.4* 11.6* 10.3* 10.9* 9.4* 9.0*  -Continue monitoring  Multiple sclerosis (HCC) -Defer to neurology   Elevated liver enzymes: Pattern consistent with rhabdo.  Acute hepatitis panel negative.  RUQ [place] without acute finding.  Improved. -Continue monitoring   Essential hypertension: Normotensive. -Continue holding home meds  Leukopenia: Not acute but lower than baseline.  Iatrogenic?  MRI pelvis showed persistent nonspecific bilateral external iliac and inguinal LAD.  -May need outpatient follow-up with hematology  Class II obesity/inadequate oral intake: Prealbumin very low. Body mass index is 38.92 kg/m. Nutrition Problem: Inadequate oral intake Etiology: poor appetite Signs/Symptoms: per patient/family report Interventions: Refer to RD note for recommendations   DVT prophylaxis:  SCDs Start: 11/04/22 2238  Code Status: Full code Family Communication: Updated patient's sister at bedside Level of care: Telemetry Medical Status is: Inpatient Remains inpatient appropriate because: Rhabdomyolysis, bilateral lower extremity weakness and AKI   Final disposition: Home Consultants:  Nephrology Neurology General Surgery Patient's outpatient rheumatology  55 minutes with more than  50% spent in reviewing records, counseling patient/family and coordinating care.   Sch Meds:  Scheduled Meds:  azaTHIOprine  150 mg Oral Daily   feeding supplement  237 mL Oral BID BM   gabapentin  300 mg Oral TID   hydroxychloroquine  200 mg Oral BID   multivitamin with minerals  1 tablet Oral Daily   Continuous Infusions:  sodium chloride 250 mL/hr at 11/06/22 0421   PRN Meds:.ondansetron **OR** ondansetron (ZOFRAN) IV  Antimicrobials: Anti-infectives (From admission, onward)    Start     Dose/Rate Route Frequency Ordered Stop   11/06/22 1030  ceFAZolin (ANCEF) IVPB 2g/100 mL premix        2 g 200 mL/hr over 30 Minutes Intravenous On call to O.R. 11/06/22 0942 11/06/22 1132   11/05/22 0100  hydroxychloroquine (PLAQUENIL) tablet 200 mg        200 mg Oral 2 times daily 11/04/22 2237          I have personally reviewed the following labs and images: CBC: Recent Labs  Lab 11/04/22 1811 11/05/22 0534 11/06/22 0620  WBC 3.3* 2.7* 2.6*  HGB 10.9* 9.4* 9.0*  HCT 34.5* 29.7* 29.4*  MCV 84.8 83.0 84.5  PLT 185 189 278   BMP &GFR Recent Labs  Lab 11/04/22 1811 11/04/22 2243 11/05/22 0534 11/06/22 0620  NA 138  --  138 142  K 3.6  --  3.4* 4.1  CL 104  --  110 118*  CO2 20*  --  20* 17*  GLUCOSE 97  --  105* 94  BUN 57*  --  49* 26*  CREATININE 2.24*  --  1.82* 1.51*  CALCIUM 8.9  --  8.2* 7.9*  MG  --  2.1 2.1 1.8  PHOS  --  3.4 3.5 2.6   Estimated Creatinine Clearance: 53.7 mL/min (A) (by C-G formula based on SCr of 1.51 mg/dL (H)). Liver & Pancreas: Recent Labs  Lab 11/04/22 1811 11/05/22 0534 11/06/22 0620  AST 391* 317* 215*  ALT 105* 88* 70*  ALKPHOS 39 35* 32*  BILITOT 0.6 0.3 0.4  PROT 7.3 5.9* 5.2*  ALBUMIN 3.0* 2.2* 2.0*   No results for input(s): "LIPASE", "AMYLASE" in the last 168 hours. No results for input(s): "AMMONIA" in the last 168 hours. Diabetic: No results for input(s): "HGBA1C" in the last 72 hours. No results for input(s):  "GLUCAP" in the last 168 hours. Cardiac Enzymes: Recent Labs  Lab 11/04/22 2243 11/05/22 0534 11/05/22 1315 11/05/22 2157 11/06/22 0620  CKTOTAL 23,975* 09,811* 10,899* 10,783* 8,947*   No results for input(s): "PROBNP" in the last 8760 hours. Coagulation Profile: No results for input(s): "INR", "PROTIME" in the last 168 hours. Thyroid Function Tests: Recent Labs    11/04/22 2243  TSH 0.661   Lipid Profile: No results for input(s): "CHOL", "HDL", "LDLCALC", "TRIG", "CHOLHDL", "LDLDIRECT" in the last 72 hours. Anemia Panel: Recent Labs    11/04/22 2243 11/05/22 0534  VITAMINB12  --  309  FOLATE  --  9.6  FERRITIN 721*  --   TIBC 210*  --   IRON 24*  --   RETICCTPCT <0.4*  --    Urine analysis:    Component Value Date/Time   COLORURINE YELLOW 11/05/2022 1240   APPEARANCEUR HAZY (A) 11/05/2022 1240   LABSPEC 1.017 11/05/2022 1240   PHURINE 6.0 11/05/2022 1240   GLUCOSEU NEGATIVE 11/05/2022 1240   HGBUR MODERATE (A) 11/05/2022 1240   BILIRUBINUR NEGATIVE 11/05/2022 1240   BILIRUBINUR Neg 05/08/2010 1553   KETONESUR NEGATIVE 11/05/2022 1240   PROTEINUR 30 (A) 11/05/2022 1240   UROBILINOGEN 1.0 11/11/2013 0150   NITRITE NEGATIVE 11/05/2022 1240   LEUKOCYTESUR NEGATIVE 11/05/2022 1240   Sepsis Labs: Invalid input(s): "PROCALCITONIN", "LACTICIDVEN"  Microbiology: Recent Results (from the past 240 hour(s))  Urine Culture     Status: Abnormal   Collection Time: 11/04/22  6:11 PM   Specimen: Urine, Clean Catch  Result Value Ref Range Status   Specimen Description   Final    URINE, CLEAN CATCH Performed at [facility], [address] [place], [place] [address]    Special Requests   Final    NONE Performed at [facility], [address] [place], [place] [address]    Culture MULTIPLE SPECIES PRESENT, SUGGEST RECOLLECTION (A)  Final   Report Status 11/06/2022 FINAL  Final  SARS Coronavirus 2 by RT PCR (hospital order,  performed in [facility] hospital lab) *cepheid single result test* Anterior Nasal Swab     Status: None   Collection Time: 11/04/22 10:50 PM   Specimen: Anterior Nasal Swab  Result Value Ref Range Status   SARS Coronavirus 2 by RT PCR NEGATIVE NEGATIVE Final    Comment: (NOTE) SARS-CoV-2 target nucleic acids are NOT DETECTED.  The SARS-CoV-2 RNA is generally detectable in upper and lower respiratory specimens during the acute phase of infection. The lowest concentration of SARS-CoV-2 viral  copies this assay can detect is 250 copies / mL. A negative result does not preclude SARS-CoV-2 infection and should not be used as the sole basis for treatment or other patient management decisions.  A negative result may occur with improper specimen collection / handling, submission of specimen other than nasopharyngeal swab, presence of viral mutation(s) within the areas targeted by this assay, and inadequate number of viral copies (<250 copies / mL). A negative result must be combined with clinical observations, patient history, and epidemiological information.  Fact Sheet for Patients:   RoadLapTop.co.za  Fact Sheet for Healthcare Providers: http://kim-miller.com/  This test is not yet approved or  cleared by the [place] FDA and has been authorized for detection and/or diagnosis of SARS-CoV-2 by FDA under an Emergency Use Authorization (EUA).  This EUA will remain in effect (meaning this test can be used) for the duration of the COVID-19 declaration under Section 564(b)(1) of the Act, 21 U.S.C. section 360bbb-3(b)(1), unless the authorization is terminated or revoked sooner.  Performed at [facility], [address], [place], [place] [address]     Radiology Studies: MR FEMUR LEFT WO CONTRAST  Result Date: 11/05/2022 CLINICAL DATA:  Bilateral hip with thigh and leg pain. History of systemic lupus erythematosus and  multiple sclerosis. Evaluate for myositis. EXAM: MR OF THE LEFT FEMUR WITHOUT CONTRAST; MRI OF THE RIGHT FEMUR WITHOUT CONTRAST TECHNIQUE: Multiplanar, multisequence MR imaging of both thighs was performed. No intravenous contrast was administered. COMPARISON:  Pelvic CT 06/16/2022. FINDINGS: Pelvic MRI findings dictated separately. Bones/Joint/Cartilage No evidence of acute fracture, dislocation or osteonecrosis. No significant hip joint effusions. Moderate size nearly symmetric knee joint effusions bilaterally. Ligaments Examination not tailored for ligamentous evaluation. No ligamentous abnormalities identified. Muscles and Tendons There is severe, nearly symmetric T2 hyperintensity throughout the musculature of the lower pelvis and proximal thighs. Pelvic findings are dictated separately. There is diffuse involvement of the hip adductor musculature bilaterally. There is also lesser fairly symmetric involvement of the vastus intermedius muscles bilaterally. There is asymmetric involvement of the left semitendinosus and biceps muscles. No focal atrophy, fluid collection or tendon tear identified. Soft tissues No significant subcutaneous inflammatory changes or focal fluid collections. As described on separate pelvic MRI, there is bilateral external and inguinal lymphadenopathy which has progressed from previous CT. IMPRESSION: 1. Severe, nearly symmetric T2 hyperintensity throughout the musculature of the lower pelvis and proximal thighs bilaterally consistent with myositis. There is asymmetric involvement of the left semitendinosus and biceps muscles. 2. No focal muscular atrophy or fluid collection identified. 3. No acute osseous findings. 4. Bilateral external and inguinal lymphadenopathy, further discussed on pelvic MRI. Electronically Signed   By: Carey Bullocks M.D.   On: 11/05/2022 16:54   MR [id number] RIGHT WO CONTRAST  Result Date: 11/05/2022 CLINICAL DATA:  Bilateral hip with thigh and leg pain. History  of systemic lupus erythematosus and multiple sclerosis. Evaluate for myositis. EXAM: MR OF THE LEFT FEMUR WITHOUT CONTRAST; MRI OF THE RIGHT FEMUR WITHOUT CONTRAST TECHNIQUE: Multiplanar, multisequence MR imaging of both thighs was performed. No intravenous contrast was administered. COMPARISON:  Pelvic CT 06/16/2022. FINDINGS: Pelvic MRI findings dictated separately. Bones/Joint/Cartilage No evidence of acute fracture, dislocation or osteonecrosis. No significant hip joint effusions. Moderate size nearly symmetric knee joint effusions bilaterally. Ligaments Examination not tailored for ligamentous evaluation. No ligamentous abnormalities identified. Muscles and Tendons There is severe, nearly symmetric T2 hyperintensity throughout the musculature of the lower pelvis and proximal thighs. Pelvic findings are dictated separately.  There is diffuse involvement of the hip adductor musculature bilaterally. There is also lesser fairly symmetric involvement of the vastus intermedius muscles bilaterally. There is asymmetric involvement of the left semitendinosus and biceps muscles. No focal atrophy, fluid collection or tendon tear identified. Soft tissues No significant subcutaneous inflammatory changes or focal fluid collections. As described on separate pelvic MRI, there is bilateral external and inguinal lymphadenopathy which has progressed from previous CT. IMPRESSION: 1. Severe, nearly symmetric T2 hyperintensity throughout the musculature of the lower pelvis and proximal thighs bilaterally consistent with myositis. There is asymmetric involvement of the left semitendinosus and biceps muscles. 2. No focal muscular atrophy or fluid collection identified. 3. No acute osseous findings. 4. Bilateral external and inguinal lymphadenopathy, further discussed on pelvic MRI. Electronically Signed   By: Carey Bullocks M.D.   On: 11/05/2022 16:54   MR PELVIS WO CONTRAST  Result Date: 11/05/2022 CLINICAL DATA:  Bilateral hip  with thigh and leg pain. History of systemic lupus erythematosus and multiple sclerosis. Evaluate for myositis. EXAM: MRI PELVIS WITHOUT CONTRAST TECHNIQUE: Multiplanar multisequence MR imaging of the pelvis was performed. No intravenous contrast was administered. COMPARISON:  Abdominopelvic CT 06/16/2022. FINDINGS: Bilateral thigh studies are dictated separately. Urinary Tract: The visualized distal ureters and bladder appear unremarkable. Bowel: No bowel wall thickening, distention or surrounding inflammation identified within the pelvis. Mildly prominent stool within the distal colon. Vascular/Lymphatic: Persistent bilateral external iliac and inguinal adenopathy, progressive from prior CT. Representative nodes include a right external iliac node measuring 1.1 cm short axis on image 29/6, a right inguinal node measuring 1.2 cm on image 38/6 and a left external iliac node measuring 1.5 cm on image 30/6. No significant vascular findings on noncontrast imaging. Reproductive: The uterus and ovaries appear unremarkable. No adnexal mass. Other: No significant pelvic ascites. Musculoskeletal: No acute or worrisome osseous findings. Mildly heterogeneous marrow signal. The hip and sacroiliac joints appear unremarkable. No significant hip joint effusion. There is severe nearly symmetric T2 hyperintensity throughout the visualized musculature, including the psoas, iliacus, pelvic sidewall, gluteus and adductor musculature of both thighs. No focal fluid collection, focal muscular atrophy or acute tendon abnormalities are identified. IMPRESSION: 1. Severe, nearly symmetric T2 hyperintensity throughout the visualized musculature of the pelvis and thighs, consistent with multifocal myositis. No focal fluid collection or focal muscular atrophy identified. 2. Persistent nonspecific bilateral external iliac and inguinal adenopathy, progressive from prior CT. Cannot exclude lymphoproliferative process. 3. No acute osseous findings  or significant hip arthropathy. 4. Bilateral thigh studies are dictated separately. Electronically Signed   By: Carey Bullocks M.D.   On: 11/05/2022 16:42      Deannie Resetar T. Danyal Whitenack [facility]  If 7PM-7AM, please contact night-coverage [email or web address] 11/06/2022, 1:05 PM

## 2022-11-06 NOTE — Progress Notes (Addendum)
[facility] Resident Progress Note  Name: Emily Hicks MRN: [id number] DOB: 1985-07-06  Subjective:  Doing okay today. Still tired and weak, but no significant changes in condition since yesterday. Is ready for muscle biopsy at time of my encounter.  Intake/Output Summary (Last 24 hours) at 11/06/2022 1306 Last data filed at 11/06/2022 1200 Gross per 24 hour  Intake 900 ml  Output 10 ml  Net 890 ml    Vitals:  Vitals:   11/06/22 1012 11/06/22 1215 11/06/22 1230 11/06/22 1245  BP: (!) 151/95 121/85 (!) 134/95 (!) 143/94  Pulse: 82 81 79 74  Resp: 16 19 19  (!) 23  Temp: 98.5 F (36.9 C) 97.9 F (36.6 C)  97.9 F (36.6 C)  TempSrc: Oral     SpO2: 100% 98% 94% 98%  Weight:      Height:         Physical Exam:  No distress Heart rate and rhythm normal Breathing comfortably on room air No peripheral edema Anterior thigh compartments soft, mildly tender to palpation Alert and oriented  Medications reviewed   Labs:     Latest Ref Rng & Units 11/06/2022    6:20 AM 11/05/2022    5:34 AM 11/04/2022    6:11 PM  BMP  Glucose 70 - 99 mg/dL 94  409  97   BUN 6 - 20 mg/dL 26  49  57   Creatinine 0.44 - 1.00 mg/dL 8.11  9.14  7.82   Sodium 135 - 145 mmol/L 142  138  138   Potassium 3.5 - 5.1 mmol/L 4.1  3.4  3.6   Chloride 98 - 111 mmol/L 118  110  104   CO2 22 - 32 mmol/L 17  20  20    Calcium 8.9 - 10.3 mg/dL 7.9  8.2  8.9      Assessment/Plan:   37 year old with lupus and MS on immunomodulators presents with weakness and is admitted for myositis complicated by rhabdomyolysis.  Rhabdomyolysis AKI Secondary to rhabdomyolysis, improving with fluids. AKI is less likely lupus nephritis given her improvement with supportive care. Continue fluids.  Myositis Bilateral, symmetric inflammation of muscles of anterior thighs and pelvis on MRI, consistent with myositis. Query lupus-associated myositis. Less likely drug-associated and appreciate pharmacy assistance  with review of medications for culprits, although hydroxychloroquine is rarely associated with myositis. No seizures or falls with prolonged downtime. Muscle biopsy with surgery today, those results are pending. Anticipate starting steroids after biopsy.   SLE Low complement, elevated anti-DS DNA, elevated ESR, consistent with lupus flare. Is on hydroxychloroquine and Saphnelo in outpatient setting.  Normocytic anemia Likely from chronic inflammation.  Final recommendations pending attending attestation.  Marrianne Mood MD 11/06/2022, 1:06 PM   Seen and examined independently this AM.  Agree with note and exam as documented above by resident physician and as noted here.  Seen prior to being taken off of the floor for the muscle biopsy.  She feels a little better today.  Glad to hear about interim improvement in several labs   # AKI - Secondary to rhabdomyolysis - Continue fluids - Less concern for lupus nephritis given her improvement with supportive measures thus far and her UA   # Rhabdomyolysis - Continue aggressive fluids - We have reached out to pharmacy to see if any causative agents.  From my very basic review hydroxychloroquine can cause myositis but unsure how likely this would be vs lupus or MS.  Seems more likely due to  lupus     # Muscle weakness - unclear if MS or myositis from lupus or something else.   - appreciate neurology and appreciate their assistance - Steroids are per discretion of neurology - muscle biopsy today    # SLE/lupus - patient had otherwise been doing well on saphnelo per her rheumatologist   # MS  - Per neurology   # Normocytic anemia - may be secondary to chronic disease - s/p aggressive fluid resuscitation with rhabdo  Would continue inpatient monitoring    Estanislado Emms, MD 11/06/2022 6:10 PM

## 2022-11-06 NOTE — Progress Notes (Addendum)
Patient is back on the floor after procedure at 1300PM. Alert and oriented. On RA. No bleeding from procedure site. Patient connected to the continous pulse oxy meter and telemetry. SCD placed. Spirometry introduced. Will continue to monitor

## 2022-11-06 NOTE — Progress Notes (Signed)
Patient was taken for the procedure via bed at 0955AM. Alert and oriented

## 2022-11-06 NOTE — Anesthesia Postprocedure Evaluation (Signed)
Anesthesia Post Note  Patient: Emily Hicks  Procedure(s) Performed: RIGHT THIGH MUSCLE BIOPSY (Right: Thigh)     Patient location during evaluation: PACU Anesthesia Type: General Level of consciousness: awake and alert Pain management: pain level controlled Vital Signs Assessment: post-procedure vital signs reviewed and stable Respiratory status: spontaneous breathing, nonlabored ventilation, respiratory function stable and patient connected to nasal cannula oxygen Cardiovascular status: blood pressure returned to baseline and stable Postop Assessment: no apparent nausea or vomiting Anesthetic complications: no   No notable events documented.  Last Vitals:  Vitals:   11/06/22 1245 11/06/22 1317  BP: (!) 143/94 (!) 144/92  Pulse: 74 63  Resp: (!) 23   Temp: 36.6 C (!) 36.4 C  SpO2: 98% 95%    Last Pain:  Vitals:   11/06/22 1317  TempSrc: Oral  PainSc:                  [place]

## 2022-11-07 ENCOUNTER — Ambulatory Visit: Payer: 59 | Attending: Nurse Practitioner | Admitting: Nurse Practitioner

## 2022-11-07 ENCOUNTER — Encounter (HOSPITAL_COMMUNITY): Payer: Self-pay | Admitting: Surgery

## 2022-11-07 DIAGNOSIS — D649 Anemia, unspecified: Secondary | ICD-10-CM | POA: Diagnosis not present

## 2022-11-07 DIAGNOSIS — N179 Acute kidney failure, unspecified: Secondary | ICD-10-CM | POA: Diagnosis not present

## 2022-11-07 DIAGNOSIS — M609 Myositis, unspecified: Secondary | ICD-10-CM | POA: Diagnosis not present

## 2022-11-07 DIAGNOSIS — R748 Abnormal levels of other serum enzymes: Secondary | ICD-10-CM | POA: Diagnosis not present

## 2022-11-07 DIAGNOSIS — I10 Essential (primary) hypertension: Secondary | ICD-10-CM | POA: Diagnosis not present

## 2022-11-07 LAB — CBC
HCT: 28.1 % — ABNORMAL LOW (ref 36.0–46.0)
Hemoglobin: 8.6 g/dL — ABNORMAL LOW (ref 12.0–15.0)
MCH: 26.1 pg (ref 26.0–34.0)
MCHC: 30.6 g/dL (ref 30.0–36.0)
MCV: 85.4 fL (ref 80.0–100.0)
Platelets: 283 10*3/uL (ref 150–400)
RBC: 3.29 MIL/uL — ABNORMAL LOW (ref 3.87–5.11)
RDW: 14.1 % (ref 11.5–15.5)
WBC: 3.5 10*3/uL — ABNORMAL LOW (ref 4.0–10.5)
nRBC: 0 % (ref 0.0–0.2)

## 2022-11-07 LAB — COMPREHENSIVE METABOLIC PANEL
ALT: 61 U/L — ABNORMAL HIGH (ref 0–44)
AST: 134 U/L — ABNORMAL HIGH (ref 15–41)
Albumin: 1.8 g/dL — ABNORMAL LOW (ref 3.5–5.0)
Alkaline Phosphatase: 37 U/L — ABNORMAL LOW (ref 38–126)
Anion gap: 10 (ref 5–15)
BUN: 26 mg/dL — ABNORMAL HIGH (ref 6–20)
CO2: 15 mmol/L — ABNORMAL LOW (ref 22–32)
Calcium: 8.3 mg/dL — ABNORMAL LOW (ref 8.9–10.3)
Chloride: 122 mmol/L — ABNORMAL HIGH (ref 98–111)
Creatinine, Ser: 1.34 mg/dL — ABNORMAL HIGH (ref 0.44–1.00)
GFR, Estimated: 53 mL/min — ABNORMAL LOW (ref >60)
Glucose, Bld: 136 mg/dL — ABNORMAL HIGH (ref 70–99)
Potassium: 4.8 mmol/L (ref 3.5–5.1)
Sodium: 147 mmol/L — ABNORMAL HIGH (ref 135–145)
Total Bilirubin: 0.3 mg/dL (ref 0.3–1.2)
Total Protein: 5 g/dL — ABNORMAL LOW (ref 6.5–8.1)

## 2022-11-07 LAB — MAGNESIUM: Magnesium: 1.5 mg/dL — ABNORMAL LOW (ref 1.7–2.4)

## 2022-11-07 LAB — CK: Total CK: 3208 U/L — ABNORMAL HIGH (ref 38–234)

## 2022-11-07 MED ORDER — SULFAMETHOXAZOLE-TRIMETHOPRIM 800-160 MG PO TABS
1.0000 | ORAL_TABLET | Freq: Two times a day (BID) | ORAL | Status: DC
  Administered 2022-11-07: 1 via ORAL
  Filled 2022-11-07: qty 1

## 2022-11-07 MED ORDER — SODIUM BICARBONATE 650 MG PO TABS
650.0000 mg | ORAL_TABLET | Freq: Three times a day (TID) | ORAL | Status: DC
  Administered 2022-11-07: 650 mg via ORAL
  Filled 2022-11-07: qty 1

## 2022-11-07 MED ORDER — FREE WATER: 200.0000 mL | Freq: Four times a day (QID) | Status: DC

## 2022-11-07 MED ORDER — MELATONIN 5 MG PO TABS
5.0000 mg | ORAL_TABLET | Freq: Every evening | ORAL | Status: DC | PRN
  Administered 2022-11-07: 5 mg via ORAL
  Filled 2022-11-07: qty 1

## 2022-11-07 MED ORDER — PREDNISONE 20 MG PO TABS
50.0000 mg | ORAL_TABLET | Freq: Every day | ORAL | Status: DC
  Administered 2022-11-07 – 2022-11-09 (×3): 50 mg via ORAL
  Filled 2022-11-07 (×3): qty 1

## 2022-11-07 MED ORDER — MAGNESIUM SULFATE 2 GM/50ML IV SOLN
2.0000 g | Freq: Once | INTRAVENOUS | Status: AC
  Administered 2022-11-07: 2 g via INTRAVENOUS
  Filled 2022-11-07: qty 50

## 2022-11-07 MED ORDER — PREDNISONE 10 MG PO TABS
ORAL_TABLET | ORAL | 0 refills | Status: DC
Start: 2022-12-20 — End: 2022-11-09

## 2022-11-07 MED ORDER — PREDNISONE 50 MG PO TABS: 50.0000 mg | ORAL_TABLET | Freq: Every day | ORAL | 0 refills | Status: DC

## 2022-11-07 MED ORDER — PANTOPRAZOLE SODIUM 40 MG PO TBEC
40.0000 mg | DELAYED_RELEASE_TABLET | Freq: Every day | ORAL | Status: DC
  Administered 2022-11-07 – 2022-11-09 (×3): 40 mg via ORAL
  Filled 2022-11-07 (×3): qty 1

## 2022-11-07 MED ORDER — SODIUM CHLORIDE 0.45 % IV SOLN: INTRAVENOUS | Status: DC

## 2022-11-07 MED ORDER — FREE WATER
200.0000 mL | Freq: Four times a day (QID) | Status: DC
  Administered 2022-11-07 – 2022-11-08 (×4): 200 mL via ORAL

## 2022-11-07 NOTE — Progress Notes (Signed)
Physical Therapy Treatment Patient Details Name: Emily Hicks MRN: [id number] DOB: September 12, 1985 Today's Date: 11/07/2022   History of Present Illness Pt is a 37 y/o F admitted on 11/04/22 after presenting with c/o generalized weakness & progressive LE weakness with pain for ~2 weeks. Nephrology discussed with patient's rheumatologist who recommended muscle biopsy and steroid.  MRI pelvis & bilateral femur multifocal myositis, persistent & nonspecific bilateral external iliac & inguinal LAD that has progressed from prior CT. Patient underwent muscle biopsy by general surgery on 10/3.  Pathology result pending. PMH: MS, lupus pericarditis, pancytopenia, obesity    PT Comments  Pt seen for PT tx with pt agreeable. Pt is able to complete bed mobility with mod I, STS with supervision with cuing re: hand placement & rollator brake management. Pt ambulates in room, bathroom, & hallway with rollator & supervision, taking rest breaks PRN. Pt is motivated to participate. Husband present during session. Will continue to follow pt acutely to address balance, strengthening, activity tolerance.   If plan is discharge home, recommend the following: Help with stairs or ramp for entrance;Assist for transportation;Assistance with cooking/housework   Can travel by private vehicle        Equipment Recommendations  None recommended by PT    Recommendations for Other Services       Precautions / Restrictions Precautions Precautions: Fall Restrictions Weight Bearing Restrictions: No     Mobility  Bed Mobility Overal bed mobility: Modified Independent Bed Mobility: Supine to Sit     Supine to sit: Modified independent (Device/Increase time), HOB elevated, Used rails          Transfers Overall transfer level: Needs assistance Equipment used: Rollator (4 wheels) Transfers: Sit to/from Stand Sit to Stand: Supervision           General transfer comment: education re: hand placement, rollator  brake management    Ambulation/Gait Ambulation/Gait assistance: Supervision Gait Distance (Feet): 75 Feet (+ 150 ft) Assistive device: Rollator (4 wheels) Gait Pattern/deviations: Decreased dorsiflexion - right, Decreased dorsiflexion - left, Decreased step length - left, Decreased step length - right, Decreased stride length Gait velocity: decreased     General Gait Details: slow, guarded gait. 1 seated rest break with PT providing education re: rollator management/parking near wall if/when able, management of rollator brakes.   Stairs             Wheelchair Mobility     Tilt Bed    Modified Rankin (Stroke Patients Only)       Balance Overall balance assessment: Needs assistance   Sitting balance-Leahy Scale: Good     Standing balance support: During functional activity, No upper extremity supported Standing balance-Leahy Scale: Fair Standing balance comment: Pt able to stand to manage underwear after toileting without assistance without LOB.                            Cognition Arousal: Alert Behavior During Therapy: WFL for tasks assessed/performed Overall Cognitive Status: Within Functional Limits for tasks assessed                                 General Comments: somewhat delayed response time        Exercises      General Comments General comments (skin integrity, edema, etc.): Pt with continent void on toilet.      Pertinent Vitals/Pain Pain Assessment Pain Assessment: No/denies  pain    Home Living                          Prior Function            PT Goals (current goals can now be found in the care plan section) Acute Rehab PT Goals Patient Stated Goal: return home with family PT Goal Formulation: With patient Time For Goal Achievement: 11/19/22 Potential to Achieve Goals: Good Progress towards PT goals: Progressing toward goals    Frequency    Min 1X/week      PT Plan       Co-evaluation              AM-PAC PT "6 Clicks" Mobility   Outcome Measure  Help needed turning from your back to your side while in a flat bed without using bedrails?: None Help needed moving from lying on your back to sitting on the side of a flat bed without using bedrails?: None Help needed moving to and from a bed to a chair (including a wheelchair)?: A Little Help needed standing up from a chair using your arms (e.g., wheelchair or bedside chair)?: A Little Help needed to walk in hospital room?: A Little Help needed climbing 3-5 steps with a railing? : A Little 6 Click Score: 20    End of Session   Activity Tolerance: Patient tolerated treatment well Patient left: in chair;with call bell/phone within reach;with family/visitor present;with nursing/sitter in room   PT Visit Diagnosis: Other abnormalities of gait and mobility (R26.89);Muscle weakness (generalized) (M62.81)     Time: 6063-0160 PT Time Calculation (min) (ACUTE ONLY): 25 min  Charges:    $Therapeutic Activity: 23-37 mins PT General Charges $$ ACUTE PT VISIT: 1 Visit                     Aleda Grana, PT, DPT 11/07/22, 11:25 AM  Sandi Mariscal 11/07/2022, 11:24 AM

## 2022-11-07 NOTE — Progress Notes (Addendum)
PROGRESS NOTE  Emily Hicks UJW:[id number] DOB: 1985/02/22   PCP: Patient, No Pcp Per  Patient is from: Home.  DOA: 11/04/2022 LOS: 3  Chief complaints Chief Complaint  Patient presents with   Weakness     Brief Narrative / Interim history: 37 year old F with PMH of MS, lupus pericarditis, pancytopenia and obesity presenting with generalized weakness and progressive lower extremity weakness with pain for about 2 weeks, and admitted with working diagnosis of SLE, AKI and rhabdomyolysis.  Per patient, symptoms felt like lupus flare.  In ED, vital stable. Cr 2.24 (baseline about 0.6).  BUN 57.  AST 391.  ALT 105.  CK [id number].  Troponin negative.  CRP 7.3.  WBC 3.3.  Hgb 10.9.  UA without significant finding other than large Hgb without significant RBC.  CXR and KUB without significant finding.  Renal US ordered.  Neurology and nephrology consulted.  Patient was started on IV fluid.   Renal ultrasound without acute finding.  CK trended up to 49,400.  Nephrology discussed with patient's rheumatologist who recommended muscle biopsy and steroid.  MRI pelvis and bilateral femur multifocal myositis, persistent and nonspecific bilateral external iliac and inguinal LAD that has progressed from prior CT. Patient underwent muscle biopsy by general surgery on 10/3.  Pathology result pending.  Renal function and rhabdomyolysis improved.  Subjective: Seen and examined earlier this morning.  No major events overnight of this morning.  Feels "okay".  Denies pain, numbness or tingling.  She denies chest pain or dyspnea.  Feels weak.  Likes to get out of the bed and move more.  Objective: Vitals:   11/06/22 1851 11/06/22 2034 11/06/22 2355 11/07/22 0454  BP: (!) 147/108 (!) 152/91 (!) 150/99 137/87  Pulse: 60 80 63 (!) 59  Resp:  18 18 18   Temp: (!) 95.5 F (35.3 C) 97.8 F (36.6 C) 98.2 F (36.8 C) (!) 97.5 F (36.4 C)  TempSrc:  Oral Oral Oral  SpO2: 99% 99% 99% 97%  Weight:      Height:         Examination:  GENERAL: No apparent distress.  Nontoxic. HEENT: MMM.  Vision and hearing grossly intact.  NECK: Supple.  No apparent JVD.  RESP:  No IWOB.  Fair aeration bilaterally. CVS:  RRR. Heart sounds normal.  ABD/GI/GU: BS+. Abd soft, NTND.  MSK/EXT: BLE weakness, right slightly greater than left SKIN: no apparent skin lesion or wound.  Biopsy site DCI. NEURO: Awake, alert and oriented appropriately.  BLE weakness, right slightly greater than left PSYCH: Calm. Normal affect.   Procedures:  10/3-muscle biopsy by general surgery  Microbiology summarized: COVID-19 PCR nonreactive Urine culture with multiple species.  Assessment and plan: Active Problems:   SLE (systemic lupus erythematosus related syndrome) (HCC)   Normocytic anemia   Obesity, Class I, BMI 30.0-34.9 (see actual BMI)   Multiple sclerosis (HCC)   Elevated liver enzymes   Essential hypertension   AKI (acute kidney injury) (HCC)   Rhabdomyolysis   Generalized and lower extremity weakness and pain: Likely due to rhabdo and myositis.  No report of trauma, exertion or excessive dehydration.  MRI pelvis and bilateral femur showed myositis.  Inflammatory markers elevated.  Complement levels low.  dsDNA slightly elevated.  She reports some improvement.  No significant pain, numbness or tingling.  No chest pain or dyspnea.  CK peaked at 49,000 and trended down to 3000. -Appreciate help by nephrology, neurology, rheumatology and surgery -Change IV NS to half NS and decrease rate  to 125 cc an hour -Defer steroid to neurology -Follow muscle biopsy results -PT/OT  Nontraumatic rhabdomyolysis: Concerned about myopathy from lupus flareup.  MRI pelvis and bilateral femur concerning for severe myositis.  Inflammatory markers elevated.  Improving. -Management as above -Continue monitoring  SLE with possible flare: On Plaquenil, azathioprine and Saphnelo outpatient.  Recently finished steroid course.  Reportedly she  was on CellCept in the past but it stopped working.  Complement levels low.  ds DNA slightly elevated. -Continue Plaquenil and Imuran -Defer steroid to neurology/rheumatology  AKI: Likely due to rhabdo.  Renal US without acute finding.  Low suspicion for lupus nephritis per nephrology.  Improved. Recent Labs    06/11/22 1135 06/16/22 1433 06/16/22 1835 06/17/22 0142 11/04/22 1811 11/05/22 0534 11/06/22 0620 11/07/22 0607  BUN 9 9  --  12 57* 49* 26* 26*  CREATININE 0.76 0.79 0.74 0.65 2.24* 1.82* 1.51* 1.34*  -IV fluid as above -Nephrology on board  Hypernatremia/hyperchloremia: Likely due to NS infusion -Changed NS to half NS -Recheck in the morning  Non-anion gap metabolic acidosis: Likely due to IV fluid. -P.o. sodium bicarb mg -Decreased IV fluid rate   Normocytic anemia: Anemia panel consistent with anemia of chronic disease.  Drop in Hgb likely dilutional Recent Labs    06/11/22 1135 06/16/22 1433 06/16/22 1835 06/17/22 0142 11/04/22 1811 11/05/22 0534 11/06/22 0620 11/07/22 0607  HGB 12.1 11.4* 11.6* 10.3* 10.9* 9.4* 9.0* 8.6*  -Continue monitoring  Multiple sclerosis (HCC) -Defer to neurology   Elevated liver enzymes: Pattern consistent with rhabdo.  Acute hepatitis panel negative.  RUQ [place] without acute finding.  Improved. -Continue monitoring   Essential hypertension: Normotensive. -Continue holding home meds  Pancytopenia: History of this.  Not acute but lower than baseline.  Iatrogenic?  MRI pelvis showed persistent nonspecific bilateral external iliac and inguinal LAD.  Improved. -May need outpatient follow-up with hematology  Hypomagnesemia -Monitor replenish as appropriate  Class II obesity/inadequate oral intake: Prealbumin very low. Body mass index is 38.92 kg/m. Nutrition Problem: Inadequate oral intake Etiology: poor appetite Signs/Symptoms: per patient/family report Interventions: Refer to RD note for recommendations   DVT  prophylaxis:  SCDs Start: 11/04/22 2238  Code Status: Full code Family Communication: None at bedside today. Level of care: Telemetry Medical Status is: Inpatient Remains inpatient appropriate because: Rhabdomyolysis, bilateral lower extremity weakness and AKI   Final disposition: Home Consultants:  Nephrology Neurology General Surgery Patient's outpatient rheumatologist  35 minutes with more than 50% spent in reviewing records, counseling patient/family and coordinating care.   Sch Meds:  Scheduled Meds:  azaTHIOprine  150 mg Oral Daily   feeding supplement  237 mL Oral BID BM   free water  200 mL Per Tube Q6H   gabapentin  300 mg Oral TID   hydroxychloroquine  200 mg Oral BID   multivitamin with minerals  1 tablet Oral Daily   Continuous Infusions:  sodium chloride 125 mL/hr at 11/07/22 1040   PRN Meds:.ondansetron **OR** ondansetron (ZOFRAN) IV  Antimicrobials: Anti-infectives (From admission, onward)    Start     Dose/Rate Route Frequency Ordered Stop   11/06/22 1030  ceFAZolin (ANCEF) IVPB 2g/100 mL premix        2 g 200 mL/hr over 30 Minutes Intravenous On call to O.R. 11/06/22 0942 11/06/22 1132   11/05/22 0100  hydroxychloroquine (PLAQUENIL) tablet 200 mg        200 mg Oral 2 times daily 11/04/22 2237  I have personally reviewed the following labs and images: CBC: Recent Labs  Lab 11/04/22 1811 11/05/22 0534 11/06/22 0620 11/07/22 0607  WBC 3.3* 2.7* 2.6* 3.5*  HGB 10.9* 9.4* 9.0* 8.6*  HCT 34.5* 29.7* 29.4* 28.1*  MCV 84.8 83.0 84.5 85.4  PLT 185 189 278 283   BMP &GFR Recent Labs  Lab 11/04/22 1811 11/04/22 2243 11/05/22 0534 11/06/22 0620 11/07/22 0607  NA 138  --  138 142 147*  K 3.6  --  3.4* 4.1 4.8  CL 104  --  110 118* 122*  CO2 20*  --  20* 17* 15*  GLUCOSE 97  --  105* 94 136*  BUN 57*  --  49* 26* 26*  CREATININE 2.24*  --  1.82* 1.51* 1.34*  CALCIUM 8.9  --  8.2* 7.9* 8.3*  MG  --  2.1 2.1 1.8 1.5*  PHOS  --   3.4 3.5 2.6  --    Estimated Creatinine Clearance: 60.5 mL/min (A) (by C-G formula based on SCr of 1.34 mg/dL (H)). Liver & Pancreas: Recent Labs  Lab 11/04/22 1811 11/05/22 0534 11/06/22 0620 11/07/22 0607  AST 391* 317* 215* 134*  ALT 105* 88* 70* 61*  ALKPHOS 39 35* 32* 37*  BILITOT 0.6 0.3 0.4 0.3  PROT 7.3 5.9* 5.2* 5.0*  ALBUMIN 3.0* 2.2* 2.0* 1.8*   No results for input(s): "LIPASE", "AMYLASE" in the last 168 hours. No results for input(s): "AMMONIA" in the last 168 hours. Diabetic: No results for input(s): "HGBA1C" in the last 72 hours. No results for input(s): "GLUCAP" in the last 168 hours. Cardiac Enzymes: Recent Labs  Lab 11/05/22 1315 11/05/22 2157 11/06/22 0620 11/06/22 1503 11/07/22 0607  CKTOTAL 10,899* 10,783* 8,947* 7,772* 3,208*   No results for input(s): "PROBNP" in the last 8760 hours. Coagulation Profile: No results for input(s): "INR", "PROTIME" in the last 168 hours. Thyroid Function Tests: Recent Labs    11/04/22 2243  TSH 0.661   Lipid Profile: No results for input(s): "CHOL", "HDL", "LDLCALC", "TRIG", "CHOLHDL", "LDLDIRECT" in the last 72 hours. Anemia Panel: Recent Labs    11/04/22 2243 11/05/22 0534  VITAMINB12  --  309  FOLATE  --  9.6  FERRITIN 721*  --   TIBC 210*  --   IRON 24*  --   RETICCTPCT <0.4*  --    Urine analysis:    Component Value Date/Time   COLORURINE YELLOW 11/05/2022 1240   APPEARANCEUR HAZY (A) 11/05/2022 1240   LABSPEC 1.017 11/05/2022 1240   PHURINE 6.0 11/05/2022 1240   GLUCOSEU NEGATIVE 11/05/2022 1240   HGBUR MODERATE (A) 11/05/2022 1240   BILIRUBINUR NEGATIVE 11/05/2022 1240   BILIRUBINUR Neg 05/08/2010 1553   KETONESUR NEGATIVE 11/05/2022 1240   PROTEINUR 30 (A) 11/05/2022 1240   UROBILINOGEN 1.0 11/11/2013 0150   NITRITE NEGATIVE 11/05/2022 1240   LEUKOCYTESUR NEGATIVE 11/05/2022 1240   Sepsis Labs: Invalid input(s): "PROCALCITONIN", "LACTICIDVEN"  Microbiology: Recent Results (from  the past 240 hour(s))  Urine Culture     Status: Abnormal   Collection Time: 11/04/22  6:11 PM   Specimen: Urine, Clean Catch  Result Value Ref Range Status   Specimen Description   Final    URINE, CLEAN CATCH Performed at [facility], [address], [place], [place] [address]    Special Requests   Final    NONE Performed at [facility], [address], [place], [place] [address]    Culture MULTIPLE SPECIES PRESENT, SUGGEST  RECOLLECTION (A)  Final   Report Status 11/06/2022 FINAL  Final  SARS Coronavirus 2 by RT PCR (hospital order, performed in [facility] hospital lab) *cepheid single result test* Anterior Nasal Swab     Status: None   Collection Time: 11/04/22 10:50 PM   Specimen: Anterior Nasal Swab  Result Value Ref Range Status   SARS Coronavirus 2 by RT PCR NEGATIVE NEGATIVE Final    Comment: (NOTE) SARS-CoV-2 target nucleic acids are NOT DETECTED.  The SARS-CoV-2 RNA is generally detectable in upper and lower respiratory specimens during the acute phase of infection. The lowest concentration of SARS-CoV-2 viral copies this assay can detect is 250 copies / mL. A negative result does not preclude SARS-CoV-2 infection and should not be used as the sole basis for treatment or other patient management decisions.  A negative result may occur with improper specimen collection / handling, submission of specimen other than nasopharyngeal swab, presence of viral mutation(s) within the areas targeted by this assay, and inadequate number of viral copies (<250 copies / mL). A negative result must be combined with clinical observations, patient history, and epidemiological information.  Fact Sheet for Patients:   RoadLapTop.co.za  Fact Sheet for Healthcare Providers: http://kim-miller.com/  This test is not yet approved or  cleared by the [place] FDA and has been authorized for detection  and/or diagnosis of SARS-CoV-2 by FDA under an Emergency Use Authorization (EUA).  This EUA will remain in effect (meaning this test can be used) for the duration of the COVID-19 declaration under Section 564(b)(1) of the Act, 21 U.S.C. section 360bbb-3(b)(1), unless the authorization is terminated or revoked sooner.  Performed at [facility], [address], [place], [place] [address]     Radiology Studies: No results found.    Nazariah Cadet T. Andalyn Heckstall [facility]  If 7PM-7AM, please contact night-coverage [email or web address] 11/07/2022, 10:57 AM

## 2022-11-07 NOTE — Progress Notes (Signed)
[facility] night cross cover note:   I was notified by RN of the patient's request for a sleep aid. I subsequently placed order for prn melatonin for insomnia.     Keelan Tripodi, DO Hospitalist  

## 2022-11-07 NOTE — Progress Notes (Addendum)
[facility] Resident Progress Note  Name: Emily Hicks MRN: [id number] DOB: [birth year]  Subjective:  Feeling okay today. Thigh biopsy is not uncomfortable or painful at present. She notes some mouth dryness and thirst. She reports that nursing concerned that she is not having enough urine output.   Intake/Output Summary (Last 24 hours) at 11/07/2022 1011 Last data filed at 11/07/2022 0730 Gross per 24 hour  Intake 4549.48 ml  Output 410 ml  Net 4139.48 ml    Vitals:  Vitals:   11/06/22 1851 11/06/22 2034 11/06/22 2355 11/07/22 0454  BP: (!) 147/108 (!) 152/91 (!) 150/99 137/87  Pulse: 60 80 63 (!) 59  Resp:  18 18 18   Temp: (!) 95.5 F (35.3 C) 97.8 F (36.6 C) 98.2 F (36.8 C) (!) 97.5 F (36.4 C)  TempSrc:  Oral Oral Oral  SpO2: 99% 99% 99% 97%  Weight:      Height:         Physical Exam:  No distress Dry lips and oropharyngeal mucous membranes Heart rate and rhythm normal, no appreciable murmurs Breathing comfortably on room air, anterior lung fields clear Right anterior thigh with incision closed with skin glue that looks clean and intact Abdomen non-tender Skin warm and dry Alert and oriented  Medications reviewed   Labs:     Latest Ref Rng & Units 11/07/2022    6:07 AM 11/06/2022    6:20 AM 11/05/2022    5:34 AM  BMP  Glucose 70 - 99 mg/dL 160  94  737   BUN 6 - 20 mg/dL 26  26  49   Creatinine 0.44 - 1.00 mg/dL 1.06  2.69  4.85   Sodium 135 - 145 mmol/L 147  142  138   Potassium 3.5 - 5.1 mmol/L 4.8  4.1  3.4   Chloride 98 - 111 mmol/L 122  118  110   CO2 22 - 32 mmol/L 15  17  20    Calcium 8.9 - 10.3 mg/dL 8.3  7.9  8.2      Assessment/Plan:  37 year old with lupus and MS on immunomodulators presents with weakness and is admitted for myositis complicated by rhabdomyolysis.   Rhabdomyolysis AKI Secondary to rhabdomyolysis, improving with fluids. AKI is less likely lupus nephritis given her improvement with supportive care.  Continue fluids.  Hypernatremia Hyperchloremic acidosis Agree with changing to 1/2 NS infusion for more free water given hypernatremia and dry-appearing exam with thirst.   Myositis Bilateral, symmetric inflammation of muscles of anterior thighs and pelvis on MRI, consistent with myositis. Query lupus-associated myositis. Less likely drug-associated and appreciate pharmacy assistance with review of medications for culprits, although hydroxychloroquine is rarely associated with myositis. No seizures or falls with prolonged downtime. Status-post biopsy of right rectus femoris. Deferring steroids to neurology.   SLE Low complement, elevated anti-DS DNA, elevated ESR, consistent with lupus flare. Is on hydroxychloroquine and Saphnelo in outpatient setting.   Normocytic anemia Likely from chronic inflammation and aggressive IV fluid therapy.  Final recommendations pending attending attestation.  Marrianne Mood MD 11/07/2022, 10:11 AM    Seen and examined independently.  Agree with note and exam as documented above by resident physician and as noted here.  General adult female in bed in no acute distress HEENT normocephalic atraumatic extraocular movements intact sclera anicteric Neck supple trachea midline Lungs clear to auscultation bilaterally normal work of breathing at rest  Heart S1S2 no rub Abdomen soft nontender nondistended Extremities no edema  Psych normal  mood and affect Neuro - sitting on side of bed and about to get up with PT; alert and oriented x 3 provides hx and follows commands  # AKI - Secondary to rhabdomyolysis - Continue fluids - Less concern for lupus nephritis given her improvement with supportive measures thus far and her UA   # Rhabdomyolysis - Continue aggressive fluids - We have reached out to pharmacy to see if any causative agents.  Hydroxychloroquine can theoretically cause myositis but unsure how likely this would be vs lupus or MS.  Seems more  likely due to lupus     # Muscle weakness - unclear if MS or myositis from lupus or something else.   - appreciate neurology and appreciate their assistance - Steroids are per discretion of neurology - s/p muscle biopsy on 10/3   # SLE/lupus - patient had otherwise been doing well on saphnelo per her rheumatologist   # MS  - Per neurology   # Normocytic anemia - may be secondary to chronic disease - s/p aggressive fluid resuscitation with rhabdo   Would continue inpatient monitoring   Estanislado Emms, MD 11/07/2022 2:16 PM

## 2022-11-07 NOTE — Plan of Care (Signed)

## 2022-11-07 NOTE — Progress Notes (Signed)
  Subjective No acute events. Feeling well. Denies any right leg pain or other issue at incision.  Objective: Vital signs in last 24 hours: Temp:  [95.5 F (35.3 C)-98.5 F (36.9 C)] 97.5 F (36.4 C) (10/04 0454) Pulse Rate:  [59-84] 59 (10/04 0454) Resp:  [16-23] 18 (10/04 0454) BP: (121-152)/(85-108) 137/87 (10/04 0454) SpO2:  [94 %-100 %] 97 % (10/04 0454) Last BM Date : 11/05/22 (per patient and sister)  Intake/Output from previous day: 10/03 0701 - 10/04 0700 In: 4249.6 [P.O.:790; I.V.:3359.6; IV Piggyback:100] Out: 260 [Urine:250; Blood:10] Intake/Output this shift: Total I/O In: 299.9 [I.V.:299.9] Out: -   Gen: NAD, comfortable CV: RRR Pulm: Normal work of breathing Ext: RLE incision c/d/I with dermabond in place, no erythema  Lab Results: CBC  Recent Labs    11/06/22 0620 11/07/22 0607  WBC 2.6* 3.5*  HGB 9.0* 8.6*  HCT 29.4* 28.1*  PLT 278 283   BMET Recent Labs    11/06/22 0620 11/07/22 0607  NA 142 147*  K 4.1 4.8  CL 118* 122*  CO2 17* 15*  GLUCOSE 94 136*  BUN 26* 26*  CREATININE 1.51* 1.34*  CALCIUM 7.9* 8.3*   PT/INR No results for input(s): "LABPROT", "INR" in the last 72 hours. ABG Recent Labs    11/04/22 2243  HCO3 21.1    Studies/Results:  Anti-infectives: Anti-infectives (From admission, onward)    Start     Dose/Rate Route Frequency Ordered Stop   11/06/22 1030  ceFAZolin (ANCEF) IVPB 2g/100 mL premix        2 g 200 mL/hr over 30 Minutes Intravenous On call to O.R. 11/06/22 0942 11/06/22 1132   11/05/22 0100  hydroxychloroquine (PLAQUENIL) tablet 200 mg        200 mg Oral 2 times daily 11/04/22 2237          Assessment/Plan: Patient Active Problem List   Diagnosis Date Noted   Rhabdomyolysis 11/05/2022   AKI (acute kidney injury) (HCC) 11/04/2022   Recurrent idiopathic pericarditis 08/04/2022   Complicated UTI (urinary tract infection) 06/16/2022   Chest pain 06/13/2021   Sepsis (HCC) 06/13/2021   Rash and  nonspecific skin eruption 05/21/2020   CIN III (cervical intraepithelial neoplasia grade III) with severe dysplasia 02/09/2020   HSIL (high grade squamous intraepithelial lesion) on Pap smear of cervix 11/03/2019   Cervical cancer screening 09/30/2019   Essential hypertension 09/19/2018   SIRS (systemic inflammatory response syndrome) (HCC) 08/16/2018   SLE (systemic lupus erythematosus related syndrome) (HCC) 08/16/2018   Pancytopenia (HCC)    Anxiety and depression    Elevated liver enzymes 08/02/2018   Normocytic anemia    Tachycardia 07/16/2018   Multiple sclerosis (HCC) 03/31/2018   Common migraine 08/26/2017   Chondromalacia, patella, left 06/24/2017   Healthcare maintenance 12/20/2015   Contraceptive management 10/11/2014   Hidradenitis suppurativa of left axilla 06/07/2013   Dysplasia of cervix, low grade (CIN 1) 07/03/2011   Obesity, Class I, BMI 30.0-34.9 (see actual BMI) 05/14/2011   s/p Procedure(s): RIGHT THIGH MUSCLE BIOPSY 11/06/2022  Reviewed her procedure, findings, plans moving forward. Discussed pathology underway but will defer to primary and neurology on results and counseling therein  We will remain available for questions or concerns moving forward  No activity restriction from our perspective    LOS: 3 days   Marin Olp, MD [facility]

## 2022-11-07 NOTE — Progress Notes (Signed)
Subjective: Follows closely with Dr. Epimenio Foot [facility]) and Dr. Deanne Coffer [facility]).  Did reach out to Dr. Epimenio Foot 10/4- awaiting a reply. Next neurology appt is 12/31. Unable to see Rheumatology records in [facility]. She tolerates steroids well and is willing to start a 6 week course followed by a taper. She believes her next Rheumatology appt is in December as well. States she feels about 35% back to baseline.   Objective: Current vital signs: BP 137/87 (BP Location: Left Arm)   Pulse (!) 59   Temp (!) 97.5 F (36.4 C) (Oral)   Resp 18   Ht 5\' 1"  (1.549 m)   Wt 93.4 kg   LMP 04/14/2022   SpO2 97%   BMI 38.92 kg/m  Vital signs in last 24 hours: Temp:  [95.5 F (35.3 C)-98.5 F (36.9 C)] 97.5 F (36.4 C) (10/04 0454) Pulse Rate:  [59-84] 59 (10/04 0454) Resp:  [16-23] 18 (10/04 0454) BP: (121-152)/(85-108) 137/87 (10/04 0454) SpO2:  [94 %-100 %] 97 % (10/04 0454)  Intake/Output from previous day: 10/03 0701 - 10/04 0700 In: 4249.6 [P.O.:790; I.V.:3359.6; IV Piggyback:100] Out: 260 [Urine:250; Blood:10] Intake/Output this shift: Total I/O In: 299.9 [I.V.:299.9] Out: -  Nutritional status:  Diet Order             Diet regular Room service appropriate? Yes; Fluid consistency: Thin  Diet effective now                   Neurologic Exam: GENERAL: Awake, alert, in no acute distress Psych: Affect appropriate for situation, patient is calm and cooperative with examination Head: Normocephalic and atraumatic, without obvious abnormality EENT: Normal conjunctivae, moist mucous membranes, no OP obstruction LUNGS: Normal respiratory effort. Non-labored breathing on room air Extremities: warm, well perfused, without obvious deformity   NEURO:  Mental Status: Awake, alert, and oriented to person, place, time, and situation. She is able to provide a clear and coherent history of present illness. Speech/Language: speech is clear and fluent,  without dysarthria, but with a nasal quality. Comprehension intact.  No neglect is noted Cranial Nerves:  II: PERRL III, IV, VI: EOMI. Lid elevation symmetric and full without ptosis. No double vision. No nystagmus.  V: Sensation is intact to light touch but slightly diminished on the left VII: Face is symmetric resting and smiling. Able to puff cheeks and raise eyebrows. There is mild diffuse weakness of her muscles of facial expression.  VIII: Hearing intact to voice IX, X: Phonation without hoarseness or hypophonia, but nasal quality is noted.  XI: Normal sternocleidomastoid and trapezius muscle strength XII: Tongue protrudes midline without fasciculations. No lingual dysarthria.  Motor: 4/5 strength proximally to bilateral upper extremities, 4-/5 distally.  4/5 strength to bilateral lower extremities, proximal and distal. No muscle tenderness.  Tone is normal. Bulk is normal.  Sensation: Intact to light touch bilaterally in all four extremities but slightly different on the left as per patient's baseline Coordination: FTN intact bilaterally.  No pronator drift.  DTRs: 2+ throughout.  Gait: Deferred  Lab Results: Results for orders placed or performed during the hospital encounter of 11/04/22 (from the past 48 hour(s))  Osmolality, urine     Status: None   Collection Time: 11/05/22 12:40 PM  Result Value Ref Range   Osmolality, Ur 597 300 - 900 mOsm/kg    Comment: Performed at [facility], [address], [place], [place] [address]  Creatinine, urine, random     Status: None  Collection Time: 11/05/22 12:40 PM  Result Value Ref Range   Creatinine, Urine 159 mg/dL    Comment: Performed at [facility], [address] [place], [place] [address]  Sodium, urine, random     Status: None   Collection Time: 11/05/22 12:40 PM  Result Value Ref Range   Sodium, Ur 30 mmol/L    Comment: Performed at [facility], [address], [place], [place] [address]  Urinalysis,  Complete w Microscopic -Urine, Clean Catch     Status: Abnormal   Collection Time: 11/05/22 12:40 PM  Result Value Ref Range   Color, Urine YELLOW YELLOW   APPearance HAZY (A) CLEAR   Specific Gravity, Urine 1.017 1.005 - 1.030   pH 6.0 5.0 - 8.0   Glucose, UA NEGATIVE NEGATIVE mg/dL   Hgb urine dipstick MODERATE (A) NEGATIVE   Bilirubin Urine NEGATIVE NEGATIVE   Ketones, ur NEGATIVE NEGATIVE mg/dL   Protein, ur 30 (A) NEGATIVE mg/dL   Nitrite NEGATIVE NEGATIVE   Leukocytes,Ua NEGATIVE NEGATIVE   RBC / HPF 0-5 0 - 5 RBC/hpf   WBC, UA 0-5 0 - 5 WBC/hpf   Bacteria, UA RARE (A) NONE SEEN   Squamous Epithelial / HPF 6-10 0 - 5 /HPF    Comment: Performed at [facility], [address], [place], [place] [address]  Rapid urine drug screen (hospital performed)     Status: None   Collection Time: 11/05/22 12:40 PM  Result Value Ref Range   Opiates NONE DETECTED NONE DETECTED   Cocaine NONE DETECTED NONE DETECTED   Benzodiazepines NONE DETECTED NONE DETECTED   Amphetamines NONE DETECTED NONE DETECTED   Tetrahydrocannabinol NONE DETECTED NONE DETECTED   Barbiturates NONE DETECTED NONE DETECTED    Comment: (NOTE) DRUG SCREEN FOR MEDICAL PURPOSES ONLY.  IF CONFIRMATION IS NEEDED FOR ANY PURPOSE, NOTIFY LAB WITHIN 5 DAYS.  LOWEST DETECTABLE LIMITS FOR URINE DRUG SCREEN Drug Class                     Cutoff (ng/mL) Amphetamine and metabolites    1000 Barbiturate and metabolites    200 Benzodiazepine                 200 Opiates and metabolites        300 Cocaine and metabolites        300 THC                            50 Performed at [facility], [address] [place], [place] [address]   CK     Status: Abnormal   Collection Time: 11/05/22  1:15 PM  Result Value Ref Range   Total CK 10,899 (H) 38 - 234 U/L    Comment: RESULT CONFIRMED BY MANUAL DILUTION Performed at [facility], [address] [place], [place] [address]   C-reactive protein     Status:  Abnormal   Collection Time: 11/05/22  1:15 PM  Result Value Ref Range   CRP 5.2 (H) <1.0 mg/dL    Comment: Performed at [facility], [address], [place], [place] [address]  CK     Status: Abnormal   Collection Time: 11/05/22  9:57 PM  Result Value Ref Range   Total CK 10,783 (H) 38 - 234 U/L    Comment: RESULT CONFIRMED BY MANUAL DILUTION Performed at [facility], [address] [place], [place] [address]   CK     Status: Abnormal   Collection Time: 11/06/22  6:20 AM  Result Value Ref Range   Total CK 8,947 (H) 38 - 234 U/L    Comment: RESULT CONFIRMED BY MANUAL DILUTION Performed at [facility], [address] [place], [place] [address]   Comprehensive metabolic panel     Status: Abnormal   Collection Time: 11/06/22  6:20 AM  Result Value Ref Range   Sodium 142 135 - 145 mmol/L   Potassium 4.1 3.5 - 5.1 mmol/L   Chloride 118 (H) 98 - 111 mmol/L   CO2 17 (L) 22 - 32 mmol/L   Glucose, Bld 94 70 - 99 mg/dL    Comment: Glucose reference range applies only to samples taken after fasting for at least 8 hours.   BUN 26 (H) 6 - 20 mg/dL   Creatinine, Ser 8.65 (H) 0.44 - 1.00 mg/dL   Calcium 7.9 (L) 8.9 - 10.3 mg/dL   Total Protein 5.2 (L) 6.5 - 8.1 g/dL   Albumin 2.0 (L) 3.5 - 5.0 g/dL   AST 784 (H) 15 - 41 U/L   ALT 70 (H) 0 - 44 U/L   Alkaline Phosphatase 32 (L) 38 - 126 U/L   Total Bilirubin 0.4 0.3 - 1.2 mg/dL   GFR, Estimated 46 (L) >60 mL/min    Comment: (NOTE) Calculated using the CKD-EPI Creatinine Equation (2021)    Anion gap 7 5 - 15    Comment: Performed at [facility], [address] [place], [place] [address]  Magnesium     Status: None   Collection Time: 11/06/22  6:20 AM  Result Value Ref Range   Magnesium 1.8 1.7 - 2.4 mg/dL    Comment: Performed at [facility], [address], [place], [place] [address]  Phosphorus     Status: None   Collection Time: 11/06/22  6:20 AM  Result Value Ref Range   Phosphorus 2.6 2.5 -  4.6 mg/dL    Comment: Performed at [facility], [address] [place], [place] [address]  CBC     Status: Abnormal   Collection Time: 11/06/22  6:20 AM  Result Value Ref Range   WBC 2.6 (L) 4.0 - 10.5 K/uL   RBC 3.48 (L) 3.87 - 5.11 MIL/uL   Hemoglobin 9.0 (L) 12.0 - 15.0 g/dL   HCT 10.2 (L) 72.5 - 36.6 %   MCV 84.5 80.0 - 100.0 fL   MCH 25.9 (L) 26.0 - 34.0 pg   MCHC 30.6 30.0 - 36.0 g/dL   RDW 44.0 34.7 - 42.5 %   Platelets 278 150 - 400 K/uL   nRBC 0.0 0.0 - 0.2 %    Comment: Performed at [facility], [address] [place], [place] [address]  CK     Status: Abnormal   Collection Time: 11/06/22  3:03 PM  Result Value Ref Range   Total CK 7,772 (H) 38 - 234 U/L    Comment: RESULT CONFIRMED BY MANUAL DILUTION Performed at [facility], [address] [place], [place] [address]   Comprehensive metabolic panel     Status: Abnormal   Collection Time: 11/07/22  6:07 AM  Result Value Ref Range   Sodium 147 (H) 135 - 145 mmol/L   Potassium 4.8 3.5 - 5.1 mmol/L   Chloride 122 (H) 98 - 111 mmol/L   CO2  15 (L) 22 - 32 mmol/L   Glucose, Bld 136 (H) 70 - 99 mg/dL    Comment: Glucose reference range applies only to samples taken after fasting for at least 8 hours.   BUN 26 (H) 6 - 20 mg/dL   Creatinine, Ser 2.44 (H) 0.44 - 1.00 mg/dL   Calcium 8.3 (L) 8.9 - 10.3 mg/dL   Total Protein 5.0 (L) 6.5 - 8.1 g/dL   Albumin 1.8 (L) 3.5 - 5.0 g/dL   AST 010 (H) 15 - 41 U/L   ALT 61 (H) 0 - 44 U/L   Alkaline Phosphatase 37 (L) 38 - 126 U/L   Total Bilirubin 0.3 0.3 - 1.2 mg/dL   GFR, Estimated 53 (L) >60 mL/min    Comment: (NOTE) Calculated using the CKD-EPI Creatinine Equation (2021)    Anion gap 10 5 - 15    Comment: Performed at [facility], [address], [place], [place] [address]  CK     Status: Abnormal   Collection Time: 11/07/22  6:07 AM  Result Value Ref Range   Total CK 3,208 (H) 38 - 234 U/L    Comment: Performed at [facility],  [address], [place], [place] [address]  Magnesium     Status: Abnormal   Collection Time: 11/07/22  6:07 AM  Result Value Ref Range   Magnesium 1.5 (L) 1.7 - 2.4 mg/dL    Comment: Performed at [facility], [address], [place], [place] [address]  CBC     Status: Abnormal   Collection Time: 11/07/22  6:07 AM  Result Value Ref Range   WBC 3.5 (L) 4.0 - 10.5 K/uL   RBC 3.29 (L) 3.87 - 5.11 MIL/uL   Hemoglobin 8.6 (L) 12.0 - 15.0 g/dL   HCT 59.5 (L) 63.8 - 75.6 %   MCV 85.4 80.0 - 100.0 fL   MCH 26.1 26.0 - 34.0 pg   MCHC 30.6 30.0 - 36.0 g/dL   RDW 43.3 29.5 - 18.8 %   Platelets 283 150 - 400 K/uL   nRBC 0.0 0.0 - 0.2 %    Comment: Performed at [facility], [address] [place], [place] [address]    Recent Results (from the past 240 hour(s))  Urine Culture     Status: Abnormal   Collection Time: 11/04/22  6:11 PM   Specimen: Urine, Clean Catch  Result Value Ref Range Status   Specimen Description   Final    URINE, CLEAN CATCH Performed at [facility], [address], [place], [place] [address]    Special Requests   Final    NONE Performed at [facility], [address], [place], [place] [address]    Culture MULTIPLE SPECIES PRESENT, SUGGEST RECOLLECTION (A)  Final   Report Status 11/06/2022 FINAL  Final  SARS Coronavirus 2 by RT PCR (hospital order, performed in [facility] hospital lab) *cepheid single result test* Anterior Nasal Swab     Status: None   Collection Time: 11/04/22 10:50 PM   Specimen: Anterior Nasal Swab  Result Value Ref Range Status   SARS Coronavirus 2 by RT PCR NEGATIVE NEGATIVE Final    Comment: (NOTE) SARS-CoV-2 target nucleic acids are NOT DETECTED.  The SARS-CoV-2 RNA is generally detectable in upper and lower respiratory specimens during the acute phase of infection. The lowest concentration of SARS-CoV-2 viral copies this assay can detect is 250 copies / mL. A negative  result does not  preclude SARS-CoV-2 infection and should not be used as the sole basis for treatment or other patient management decisions.  A negative result may occur with improper specimen collection / handling, submission of specimen other than nasopharyngeal swab, presence of viral mutation(s) within the areas targeted by this assay, and inadequate number of viral copies (<250 copies / mL). A negative result must be combined with clinical observations, patient history, and epidemiological information.  Fact Sheet for Patients:   RoadLapTop.co.za  Fact Sheet for Healthcare Providers: http://kim-miller.com/  This test is not yet approved or  cleared by the [place] FDA and has been authorized for detection and/or diagnosis of SARS-CoV-2 by FDA under an Emergency Use Authorization (EUA).  This EUA will remain in effect (meaning this test can be used) for the duration of the COVID-19 declaration under Section 564(b)(1) of the Act, 21 U.S.C. section 360bbb-3(b)(1), unless the authorization is terminated or revoked sooner.  Performed at [facility], [address] [place], [place] [address]     Lipid Panel No results for input(s): "CHOL", "TRIG", "HDL", "CHOLHDL", "VLDL", "LDLCALC" in the last 72 hours.  Studies/Results: MR FEMUR LEFT WO CONTRAST  Result Date: 11/05/2022 CLINICAL DATA:  Bilateral hip with thigh and leg pain. History of systemic lupus erythematosus and multiple sclerosis. Evaluate for myositis. EXAM: MR OF THE LEFT FEMUR WITHOUT CONTRAST; MRI OF THE RIGHT FEMUR WITHOUT CONTRAST TECHNIQUE: Multiplanar, multisequence MR imaging of both thighs was performed. No intravenous contrast was administered. COMPARISON:  Pelvic CT 06/16/2022. FINDINGS: Pelvic MRI findings dictated separately. Bones/Joint/Cartilage No evidence of acute fracture, dislocation or osteonecrosis. No significant hip joint effusions. Moderate size  nearly symmetric knee joint effusions bilaterally. Ligaments Examination not tailored for ligamentous evaluation. No ligamentous abnormalities identified. Muscles and Tendons There is severe, nearly symmetric T2 hyperintensity throughout the musculature of the lower pelvis and proximal thighs. Pelvic findings are dictated separately. There is diffuse involvement of the hip adductor musculature bilaterally. There is also lesser fairly symmetric involvement of the vastus intermedius muscles bilaterally. There is asymmetric involvement of the left semitendinosus and biceps muscles. No focal atrophy, fluid collection or tendon tear identified. Soft tissues No significant subcutaneous inflammatory changes or focal fluid collections. As described on separate pelvic MRI, there is bilateral external and inguinal lymphadenopathy which has progressed from previous CT. IMPRESSION: 1. Severe, nearly symmetric T2 hyperintensity throughout the musculature of the lower pelvis and proximal thighs bilaterally consistent with myositis. There is asymmetric involvement of the left semitendinosus and biceps muscles. 2. No focal muscular atrophy or fluid collection identified. 3. No acute osseous findings. 4. Bilateral external and inguinal lymphadenopathy, further discussed on pelvic MRI. Electronically Signed   By: Carey Bullocks M.D.   On: 11/05/2022 16:54   MR UEAVW RIGHT WO CONTRAST  Result Date: 11/05/2022 CLINICAL DATA:  Bilateral hip with thigh and leg pain. History of systemic lupus erythematosus and multiple sclerosis. Evaluate for myositis. EXAM: MR OF THE LEFT FEMUR WITHOUT CONTRAST; MRI OF THE RIGHT FEMUR WITHOUT CONTRAST TECHNIQUE: Multiplanar, multisequence MR imaging of both thighs was performed. No intravenous contrast was administered. COMPARISON:  Pelvic CT 06/16/2022. FINDINGS: Pelvic MRI findings dictated separately. Bones/Joint/Cartilage No evidence of acute fracture, dislocation or osteonecrosis. No  significant hip joint effusions. Moderate size nearly symmetric knee joint effusions bilaterally. Ligaments Examination not tailored for ligamentous evaluation. No ligamentous abnormalities identified. Muscles and Tendons There is severe, nearly symmetric T2 hyperintensity throughout the musculature of the lower pelvis and proximal  thighs. Pelvic findings are dictated separately. There is diffuse involvement of the hip adductor musculature bilaterally. There is also lesser fairly symmetric involvement of the vastus intermedius muscles bilaterally. There is asymmetric involvement of the left semitendinosus and biceps muscles. No focal atrophy, fluid collection or tendon tear identified. Soft tissues No significant subcutaneous inflammatory changes or focal fluid collections. As described on separate pelvic MRI, there is bilateral external and inguinal lymphadenopathy which has progressed from previous CT. IMPRESSION: 1. Severe, nearly symmetric T2 hyperintensity throughout the musculature of the lower pelvis and proximal thighs bilaterally consistent with myositis. There is asymmetric involvement of the left semitendinosus and biceps muscles. 2. No focal muscular atrophy or fluid collection identified. 3. No acute osseous findings. 4. Bilateral external and inguinal lymphadenopathy, further discussed on pelvic MRI. Electronically Signed   By: Carey Bullocks M.D.   On: 11/05/2022 16:54   MR PELVIS WO CONTRAST  Result Date: 11/05/2022 CLINICAL DATA:  Bilateral hip with thigh and leg pain. History of systemic lupus erythematosus and multiple sclerosis. Evaluate for myositis. EXAM: MRI PELVIS WITHOUT CONTRAST TECHNIQUE: Multiplanar multisequence MR imaging of the pelvis was performed. No intravenous contrast was administered. COMPARISON:  Abdominopelvic CT 06/16/2022. FINDINGS: Bilateral thigh studies are dictated separately. Urinary Tract: The visualized distal ureters and bladder appear unremarkable. Bowel: No  bowel wall thickening, distention or surrounding inflammation identified within the pelvis. Mildly prominent stool within the distal colon. Vascular/Lymphatic: Persistent bilateral external iliac and inguinal adenopathy, progressive from prior CT. Representative nodes include a right external iliac node measuring 1.1 cm short axis on image 29/6, a right inguinal node measuring 1.2 cm on image 38/6 and a left external iliac node measuring 1.5 cm on image 30/6. No significant vascular findings on noncontrast imaging. Reproductive: The uterus and ovaries appear unremarkable. No adnexal mass. Other: No significant pelvic ascites. Musculoskeletal: No acute or worrisome osseous findings. Mildly heterogeneous marrow signal. The hip and sacroiliac joints appear unremarkable. No significant hip joint effusion. There is severe nearly symmetric T2 hyperintensity throughout the visualized musculature, including the psoas, iliacus, pelvic sidewall, gluteus and adductor musculature of both thighs. No focal fluid collection, focal muscular atrophy or acute tendon abnormalities are identified. IMPRESSION: 1. Severe, nearly symmetric T2 hyperintensity throughout the visualized musculature of the pelvis and thighs, consistent with multifocal myositis. No focal fluid collection or focal muscular atrophy identified. 2. Persistent nonspecific bilateral external iliac and inguinal adenopathy, progressive from prior CT. Cannot exclude lymphoproliferative process. 3. No acute osseous findings or significant hip arthropathy. 4. Bilateral thigh studies are dictated separately. Electronically Signed   By: Carey Bullocks M.D.   On: 11/05/2022 16:42    Medications: Scheduled:  azaTHIOprine  150 mg Oral Daily   feeding supplement  237 mL Oral BID BM   gabapentin  300 mg Oral TID   hydroxychloroquine  200 mg Oral BID   multivitamin with minerals  1 tablet Oral Daily   sodium bicarbonate  650 mg Oral TID   Continuous:  sodium chloride      magnesium sulfate bolus IVPB      Assessment: 37 year old female with a history of lupus, multiple sclerosis and pericarditis presents with about a 1 month history of lower extremity weakness as well as acute kidney injury in the setting of severely elevated CK level suggestive of muscle breakdown/inflammation.  Weakness is symmetrical, and she verbalizes some weakness also in upper extremities, worse distally than proximally.  - On exam continues to have proximal and distal weakness of the  BUE and BLE, in addition to mild weakness of facial musculature, mild neck weakness in flexion and nasal speech. No muscle tenderness is present and reflexes are preserved.  - Labs: - CK was noted to be quite elevated on admission at 23,975, peaking at 49,442 on 10/2, then consistently has improved to most recent level of 3,208 this morning.   - LFTs were elevated on admission with AST 391 and ALT 105. Have trended downward since then, now with AST 134 and ALT 61 today (Friday) - ESR markedly elevated at 105, consistent with an inflammatory process. CRP also elevated at 7.3 - Na normal. Ca mildly low, but in the context of low albumin.  - Elevated BUN and Cr with eGFR of 36 - Mg normal. K slightly low on admission but now normalized - Imaging: - MRI pelvis without contrast: Severe, nearly symmetric T2 hyperintensity throughout the visualized musculature of the pelvis and thighs, consistent with multifocal myositis. No focal fluid collection or focal muscular atrophy identified. Persistent nonspecific bilateral external iliac and inguinal adenopathy, progressive from prior CT. Cannot exclude lymphoproliferative process.  - MRI of bilateral thighs without contrast: Severe, nearly symmetric T2 hyperintensity throughout the musculature of the lower pelvis and proximal thighs bilaterally consistent with myositis. There is asymmetric involvement of the left semitendinosus and biceps muscles. No focal muscular atrophy  or fluid collection identified. No acute osseous findings.   - Right rectus femoris muscle was biopsied yesterday and sent to the lab.  - Overall impression: MRI confirms that this is a myositis. 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 No rash to suggest dermatomyositis.     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 Clin Pract. 2011) reported that all of their patients with overlap syndrome of systemic lupus erythematosus and myositis had been treated with oral prednisone for 6 weeks, after which the dose was gradually tapered. Over a mean follow-up of 6 years, all patients had full remission of myositis. In the event of either poor response or adverse effects of corticosteroids, a second line of treatment with immunosuppressant may be required.  -Bitencourt N, Solow EB, Wright T, Bermas BL. Inflammatory myositis in systemic lupus erythematosus. Lupus. 2020 Jun;29(7):776-781. doi: 10.1177/0961203320918021. Epub 2020 Apr 11. PMID: 16109604. "In our SLE patient population, 6.3% presented with concurrent inflammatory myositis. Dermatomyositis-specific rashes, clinical features of systemic sclerosis, arthralgias and arthritis, and cytopenias were common coexisting clinical manifestations. A high frequency of RNP, MSA, and MAA were found. People of black race and with childhood-onset disease had a higher prevalence of myositis. Our findings suggest that SLE patients of  black race, with childhood-onset SLE, and who possess MSA or MAA should be routinely screened for myositis." -Continue with hydration in the setting of elevated CK -Continue to trend CK, which has significantly improved -Monitor and correct electrolytes as indicated by lab values      LOS: 3 days   Patient seen and examined by NP/APP  Elmer Picker, DNP, FNP-BC [facility] Pager: [phone]   @  Electronically signed: Dr. Caryl Pina 11/07/2022  8:26 AM

## 2022-11-08 DIAGNOSIS — N179 Acute kidney failure, unspecified: Secondary | ICD-10-CM | POA: Diagnosis not present

## 2022-11-08 DIAGNOSIS — I10 Essential (primary) hypertension: Secondary | ICD-10-CM | POA: Diagnosis not present

## 2022-11-08 DIAGNOSIS — R29898 Other symptoms and signs involving the musculoskeletal system: Secondary | ICD-10-CM | POA: Diagnosis not present

## 2022-11-08 DIAGNOSIS — D649 Anemia, unspecified: Secondary | ICD-10-CM | POA: Diagnosis not present

## 2022-11-08 DIAGNOSIS — R7401 Elevation of levels of liver transaminase levels: Secondary | ICD-10-CM | POA: Diagnosis not present

## 2022-11-08 DIAGNOSIS — R748 Abnormal levels of other serum enzymes: Secondary | ICD-10-CM | POA: Diagnosis not present

## 2022-11-08 DIAGNOSIS — M609 Myositis, unspecified: Secondary | ICD-10-CM | POA: Diagnosis not present

## 2022-11-08 LAB — CBC
HCT: 30 % — ABNORMAL LOW (ref 36.0–46.0)
Hemoglobin: 9.3 g/dL — ABNORMAL LOW (ref 12.0–15.0)
MCH: 26.3 pg (ref 26.0–34.0)
MCHC: 31 g/dL (ref 30.0–36.0)
MCV: 84.7 fL (ref 80.0–100.0)
Platelets: 355 10*3/uL (ref 150–400)
RBC: 3.54 MIL/uL — ABNORMAL LOW (ref 3.87–5.11)
RDW: 14.5 % (ref 11.5–15.5)
WBC: 7.8 10*3/uL (ref 4.0–10.5)
nRBC: 0 % (ref 0.0–0.2)

## 2022-11-08 LAB — COMPREHENSIVE METABOLIC PANEL
ALT: 56 U/L — ABNORMAL HIGH (ref 0–44)
AST: 92 U/L — ABNORMAL HIGH (ref 15–41)
Albumin: 2.2 g/dL — ABNORMAL LOW (ref 3.5–5.0)
Alkaline Phosphatase: 38 U/L (ref 38–126)
Anion gap: 6 (ref 5–15)
BUN: 21 mg/dL — ABNORMAL HIGH (ref 6–20)
CO2: 16 mmol/L — ABNORMAL LOW (ref 22–32)
Calcium: 8.2 mg/dL — ABNORMAL LOW (ref 8.9–10.3)
Chloride: 118 mmol/L — ABNORMAL HIGH (ref 98–111)
Creatinine, Ser: 1.08 mg/dL — ABNORMAL HIGH (ref 0.44–1.00)
GFR, Estimated: >60 mL/min (ref >60)
Glucose, Bld: 127 mg/dL — ABNORMAL HIGH (ref 70–99)
Potassium: 4.6 mmol/L (ref 3.5–5.1)
Sodium: 140 mmol/L (ref 135–145)
Total Bilirubin: 0.3 mg/dL (ref 0.3–1.2)
Total Protein: 5.9 g/dL — ABNORMAL LOW (ref 6.5–8.1)

## 2022-11-08 LAB — SEDIMENTATION RATE: Sed Rate: 42 mm/h — ABNORMAL HIGH (ref 0–22)

## 2022-11-08 LAB — PHOSPHORUS: Phosphorus: 3.3 mg/dL (ref 2.5–4.6)

## 2022-11-08 LAB — PROTEIN / CREATININE RATIO, URINE
Creatinine, Urine: 54 mg/dL
Protein Creatinine Ratio: 0.44 mg/mg{creat} — ABNORMAL HIGH (ref 0.00–0.15)
Total Protein, Urine: 24 mg/dL

## 2022-11-08 LAB — MAGNESIUM: Magnesium: 1.6 mg/dL — ABNORMAL LOW (ref 1.7–2.4)

## 2022-11-08 LAB — CK: Total CK: 2003 U/L — ABNORMAL HIGH (ref 38–234)

## 2022-11-08 LAB — C-REACTIVE PROTEIN: CRP: 0.9 mg/dL (ref <1.0)

## 2022-11-08 MED ORDER — HYDRALAZINE HCL 20 MG/ML IJ SOLN
10.0000 mg | INTRAMUSCULAR | Status: DC | PRN
  Administered 2022-11-08: 10 mg via INTRAVENOUS
  Filled 2022-11-08: qty 1

## 2022-11-08 MED ORDER — LABETALOL HCL 5 MG/ML IV SOLN: 10.0000 mg | INTRAVENOUS | Status: DC | PRN

## 2022-11-08 MED ORDER — SULFAMETHOXAZOLE-TRIMETHOPRIM 800-160 MG PO TABS: 1.0000 | ORAL_TABLET | Freq: Every day | ORAL | Status: DC

## 2022-11-08 MED ORDER — METOPROLOL SUCCINATE ER 25 MG PO TB24
25.0000 mg | ORAL_TABLET | Freq: Every day | ORAL | Status: DC
  Administered 2022-11-08 – 2022-11-09 (×2): 25 mg via ORAL
  Filled 2022-11-08 (×2): qty 1

## 2022-11-08 MED ORDER — MAGNESIUM SULFATE 2 GM/50ML IV SOLN
2.0000 g | Freq: Once | INTRAVENOUS | Status: AC
  Administered 2022-11-08: 2 g via INTRAVENOUS
  Filled 2022-11-08: qty 50

## 2022-11-08 NOTE — Progress Notes (Signed)
Subjective: First dose of steroids overnight, did have some hypertension overnight. Does endorse an upset stomach from dinner last night, but this morning she is feeling better. No change in neurological exam but she states she feels better this morning. Has not been out of bed yet this morning.   Objective: Current vital signs: BP (!) 146/94 (BP Location: Left Arm)   Pulse 76   Temp 97.9 F (36.6 C) (Oral)   Resp 17   Ht 5\' 1"  (1.549 m)   Wt 93.4 kg   LMP 04/14/2022   SpO2 98%   BMI 38.92 kg/m  Vital signs in last 24 hours: Temp:  [97.4 F (36.3 C)-98.2 F (36.8 C)] 97.9 F (36.6 C) (10/05 0818) Pulse Rate:  [67-83] 76 (10/05 0818) Resp:  [17-20] 17 (10/05 0818) BP: (129-162)/(81-110) 146/94 (10/05 0818) SpO2:  [96 %-100 %] 98 % (10/05 0818)  Intake/Output from previous day: 10/04 0701 - 10/05 0700 In: 2484.9 [P.O.:120; I.V.:2364.9] Out: 550 [Urine:150; Stool:400] Intake/Output this shift: Total I/O In: 99.7 [I.V.:99.7] Out: -  Nutritional status:  Diet Order             Diet regular Room service appropriate? Yes; Fluid consistency: Thin  Diet effective now                   Neurologic Exam: GENERAL: Awake, alert, in no acute distress Psych: Affect appropriate for situation, patient is calm and cooperative with examination Head: Normocephalic and atraumatic, without obvious abnormality EENT: Normal conjunctivae, moist mucous membranes, no OP obstruction LUNGS: Normal respiratory effort. Non-labored breathing on room air Extremities: warm, well perfused, without obvious deformity   NEURO:  Mental Status: Awake, alert, and oriented to person, place, time, and situation. She is able to provide a clear and coherent history of present illness. Speech/Language: speech is clear and fluent, without dysarthria, but with a nasal quality. Comprehension intact.  No neglect is noted Cranial Nerves:  II: PERRL III, IV, VI: EOMI. Lid elevation symmetric and full  without ptosis. No double vision. No nystagmus.  V: Sensation is intact to light touch but slightly diminished on the left VII: Face is symmetric resting and smiling. Able to puff cheeks and raise eyebrows. There is mild diffuse weakness of her muscles of facial expression.  VIII: Hearing intact to voice IX, X: Phonation without hoarseness or hypophonia, but nasal quality is noted.  XI: Normal sternocleidomastoid and trapezius muscle strength XII: Tongue protrudes midline without fasciculations. No lingual dysarthria.  Motor: 4/5 strength proximally to bilateral upper extremities, 4-/5 distally.  4/5 strength to bilateral lower extremities, proximal and distal. No muscle tenderness.  Tone is normal. Bulk is normal.  Sensation: Intact to light touch bilaterally in all four extremities but slightly different on the left as per patient's baseline Coordination: FTN intact bilaterally.  No pronator drift.  DTRs: 2+ throughout.  Gait: Deferred  Lab Results: Results for orders placed or performed during the hospital encounter of 11/04/22 (from the past 48 hour(s))  CK     Status: Abnormal   Collection Time: 11/06/22  3:03 PM  Result Value Ref Range   Total CK 7,772 (H) 38 - 234 U/L    Comment: RESULT CONFIRMED BY MANUAL DILUTION Performed at [facility], [address], [place], [place] [address]   Comprehensive metabolic panel     Status: Abnormal   Collection Time: 11/07/22  6:07 AM  Result Value Ref Range   Sodium 147 (H) 135 - 145 mmol/L  Potassium 4.8 3.5 - 5.1 mmol/L   Chloride 122 (H) 98 - 111 mmol/L   CO2 15 (L) 22 - 32 mmol/L   Glucose, Bld 136 (H) 70 - 99 mg/dL    Comment: Glucose reference range applies only to samples taken after fasting for at least 8 hours.   BUN 26 (H) 6 - 20 mg/dL   Creatinine, Ser 9.16 (H) 0.44 - 1.00 mg/dL   Calcium 8.3 (L) 8.9 - 10.3 mg/dL   Total Protein 5.0 (L) 6.5 - 8.1 g/dL   Albumin 1.8 (L) 3.5 - 5.0 g/dL   AST 945 (H) 15 - 41 U/L   ALT  61 (H) 0 - 44 U/L   Alkaline Phosphatase 37 (L) 38 - 126 U/L   Total Bilirubin 0.3 0.3 - 1.2 mg/dL   GFR, Estimated 53 (L) >60 mL/min    Comment: (NOTE) Calculated using the CKD-EPI Creatinine Equation (2021)    Anion gap 10 5 - 15    Comment: Performed at [facility], [address] [place], [place] [address]  CK     Status: Abnormal   Collection Time: 11/07/22  6:07 AM  Result Value Ref Range   Total CK 3,208 (H) 38 - 234 U/L    Comment: Performed at [facility], [address], [place], [place] [address]  Magnesium     Status: Abnormal   Collection Time: 11/07/22  6:07 AM  Result Value Ref Range   Magnesium 1.5 (L) 1.7 - 2.4 mg/dL    Comment: Performed at [facility], [address] [place], [place] [address]  CBC     Status: Abnormal   Collection Time: 11/07/22  6:07 AM  Result Value Ref Range   WBC 3.5 (L) 4.0 - 10.5 K/uL   RBC 3.29 (L) 3.87 - 5.11 MIL/uL   Hemoglobin 8.6 (L) 12.0 - 15.0 g/dL   HCT 15.0 (L) 56.9 - 79.4 %   MCV 85.4 80.0 - 100.0 fL   MCH 26.1 26.0 - 34.0 pg   MCHC 30.6 30.0 - 36.0 g/dL   RDW 80.1 65.5 - 37.4 %   Platelets 283 150 - 400 K/uL   nRBC 0.0 0.0 - 0.2 %    Comment: Performed at [facility], [address], [place], [place] [address]  Comprehensive metabolic panel     Status: Abnormal   Collection Time: 11/08/22  5:17 AM  Result Value Ref Range   Sodium 140 135 - 145 mmol/L   Potassium 4.6 3.5 - 5.1 mmol/L   Chloride 118 (H) 98 - 111 mmol/L   CO2 16 (L) 22 - 32 mmol/L   Glucose, Bld 127 (H) 70 - 99 mg/dL    Comment: Glucose reference range applies only to samples taken after fasting for at least 8 hours.   BUN 21 (H) 6 - 20 mg/dL   Creatinine, Ser 8.67 (H) 0.44 - 1.00 mg/dL   Calcium 8.2 (L) 8.9 - 10.3 mg/dL   Total Protein 5.9 (L) 6.5 - 8.1 g/dL   Albumin 2.2 (L) 3.5 - 5.0 g/dL   AST 92 (H) 15 - 41 U/L   ALT 56 (H) 0 - 44 U/L   Alkaline Phosphatase 38 38 - 126 U/L   Total Bilirubin 0.3 0.3 - 1.2 mg/dL    GFR, Estimated >54 >49 mL/min    Comment: (NOTE) Calculated using the CKD-EPI Creatinine Equation (2021)    Anion gap 6 5 - 15  Comment: Performed at [facility], [address] [place], [place] [address]  CK     Status: Abnormal   Collection Time: 11/08/22  5:17 AM  Result Value Ref Range   Total CK 2,003 (H) 38 - 234 U/L    Comment: Performed at [facility], [address], [place], [place] [address]  CBC     Status: Abnormal   Collection Time: 11/08/22  5:17 AM  Result Value Ref Range   WBC 7.8 4.0 - 10.5 K/uL   RBC 3.54 (L) 3.87 - 5.11 MIL/uL   Hemoglobin 9.3 (L) 12.0 - 15.0 g/dL   HCT 62.9 (L) 52.8 - 41.3 %   MCV 84.7 80.0 - 100.0 fL   MCH 26.3 26.0 - 34.0 pg   MCHC 31.0 30.0 - 36.0 g/dL   RDW 24.4 01.0 - 27.2 %   Platelets 355 150 - 400 K/uL   nRBC 0.0 0.0 - 0.2 %    Comment: Performed at [facility], [address], [place], [place] [address]  Magnesium     Status: Abnormal   Collection Time: 11/08/22  5:17 AM  Result Value Ref Range   Magnesium 1.6 (L) 1.7 - 2.4 mg/dL    Comment: Performed at [facility], [address], [place], [place] [address]  Phosphorus     Status: None   Collection Time: 11/08/22  5:17 AM  Result Value Ref Range   Phosphorus 3.3 2.5 - 4.6 mg/dL    Comment: Performed at [facility], [address] [place], [place] [address]  C-reactive protein     Status: None   Collection Time: 11/08/22  5:17 AM  Result Value Ref Range   CRP 0.9 <1.0 mg/dL    Comment: Performed at [facility], [address], [place], [place] [address]    Recent Results (from the past 240 hour(s))  Urine Culture     Status: Abnormal   Collection Time: 11/04/22  6:11 PM   Specimen: Urine, Clean Catch  Result Value Ref Range Status   Specimen Description   Final    URINE, CLEAN CATCH Performed at [facility], [address], [place], [place] [address]    Special Requests   Final    NONE Performed at  [facility], [address], [place], [place] [address]    Culture MULTIPLE SPECIES PRESENT, SUGGEST RECOLLECTION (A)  Final   Report Status 11/06/2022 FINAL  Final  SARS Coronavirus 2 by RT PCR (hospital order, performed in [facility] hospital lab) *cepheid single result test* Anterior Nasal Swab     Status: None   Collection Time: 11/04/22 10:50 PM   Specimen: Anterior Nasal Swab  Result Value Ref Range Status   SARS Coronavirus 2 by RT PCR NEGATIVE NEGATIVE Final    Comment: (NOTE) SARS-CoV-2 target nucleic acids are NOT DETECTED.  The SARS-CoV-2 RNA is generally detectable in upper and lower respiratory specimens during the acute phase of infection. The lowest concentration of SARS-CoV-2 viral copies this assay can detect is 250 copies / mL. A negative result does not preclude SARS-CoV-2 infection and should not be used as the sole basis for treatment or other patient management decisions.  A negative result may occur with improper specimen collection / handling, submission of specimen other than nasopharyngeal swab, presence of viral mutation(s) within the areas targeted by this assay, and inadequate number of viral copies (<250 copies /  mL). A negative result must be combined with clinical observations, patient history, and epidemiological information.  Fact Sheet for Patients:   RoadLapTop.co.za  Fact Sheet for Healthcare Providers: http://kim-miller.com/  This test is not yet approved or  cleared by the [place] FDA and has been authorized for detection and/or diagnosis of SARS-CoV-2 by FDA under an Emergency Use Authorization (EUA).  This EUA will remain in effect (meaning this test can be used) for the duration of the COVID-19 declaration under Section 564(b)(1) of the Act, 21 U.S.C. section 360bbb-3(b)(1), unless the authorization is terminated or revoked sooner.  Performed at [facility], [address] [place], [place] [address]     Lipid Panel No results for input(s): "CHOL", "TRIG", "HDL", "CHOLHDL", "VLDL", "LDLCALC" in the last 72 hours.  Studies/Results: No results found.  Medications: Scheduled:  azaTHIOprine  150 mg Oral Daily   feeding supplement  237 mL Oral BID BM   gabapentin  300 mg Oral TID   hydroxychloroquine  200 mg Oral BID   multivitamin with minerals  1 tablet Oral Daily   pantoprazole  40 mg Oral Daily   predniSONE  50 mg Oral Q breakfast   Continuous:  magnesium sulfate bolus IVPB      Assessment: 37 year old female with a history of lupus, multiple sclerosis and pericarditis who presents with an approximately 1 month history of lower extremity weakness as well as acute kidney injury in the setting of severely elevated CK level suggestive of muscle breakdown/inflammation.  Weakness is symmetrical, and she verbalizes some weakness also in upper extremities, worse distally than proximally.  - On exam continues to have proximal and distal weakness of the BUE and BLE, in addition to mild weakness of facial musculature, mild neck weakness in flexion and nasal speech. No muscle tenderness is present and reflexes are preserved.  - Labs: - CK was noted to be quite elevated on admission at 23,975, peaking at 49,442 on 10/2, then consistently has improved to most recent level of 2003 this morning (Saturday).   - LFTs were elevated on admission with AST 391 and ALT 105. Have trended downward since then, now with AST 92 and ALT 56 - ESR markedly elevated at 105, consistent with an inflammatory process. CRP also elevated at 7.3 - Na normal. Ca mildly low, but in the context of low albumin.  - Elevated BUN and Cr -> improved with GFR >60 - Mg normal. K slightly low on admission but now normalized - Imaging: - MRI pelvis without contrast: Severe, nearly symmetric T2 hyperintensity throughout the visualized musculature of the pelvis and thighs,  consistent with multifocal myositis. No focal fluid collection or focal muscular atrophy identified. Persistent nonspecific bilateral external iliac and inguinal adenopathy, progressive from prior CT. Cannot exclude lymphoproliferative process.  - MRI of bilateral thighs without contrast: Severe, nearly symmetric T2 hyperintensity throughout the musculature of the lower pelvis and proximal thighs bilaterally consistent with myositis. There is asymmetric involvement of the left semitendinosus and biceps muscles. No focal muscular atrophy or fluid collection identified. No acute osseous findings.   - Right rectus femoris muscle was biopsied Thursday and sent to the lab.  - Overall impression: MRI confirms that this is a myositis. 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 No rash to suggest dermatomyositis.     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 First dose was on Friday 10/4.  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 Clin Pract. 2011) reported that all of their patients with overlap syndrome of systemic lupus erythematosus and myositis had been treated with oral prednisone for 6 weeks, after which the dose was gradually tapered. Over a mean follow-up of 6 years, all patients had full remission of myositis. In the event of either poor response or adverse effects of corticosteroids, a second line of treatment with immunosuppressant may be required.  -Bitencourt N, Solow EB, Wright T, Bermas BL. Inflammatory  myositis in systemic lupus erythematosus. Lupus. 2020 Jun;29(7):776-781. doi: 10.1177/0961203320918021. Epub 2020 Apr 11. PMID: 91478295. "In our SLE patient population, 6.3% presented with concurrent inflammatory myositis. Dermatomyositis-specific rashes, clinical features of systemic sclerosis, arthralgias and arthritis, and cytopenias were common coexisting clinical manifestations. A high frequency of RNP, MSA, and MAA were found. People of black race and with childhood-onset disease had a higher prevalence of myositis. Our findings suggest that SLE patients of black race, with childhood-onset SLE, and who possess MSA or MAA should be routinely screened for myositis." -Continue with hydration in the setting of elevated CK -Trend CK, which continues to significantly improve -Monitor and correct electrolytes as indicated by lab values      LOS: 4 days   Patient seen and examined by NP/APP  Elmer Picker, DNP, FNP-BC [facility] Pager: [phone]   @Electronically  signed: Dr. Caryl Pina 11/08/2022  8:36 AM

## 2022-11-08 NOTE — Progress Notes (Addendum)
[facility] Progress Note  Name: Emily Hicks MRN: [id number] DOB: 1985/02/28  Chief Complaint:  Weakness   Subjective:  Strict ins/outs not available.  She had 150 mL uop charted over 10/4 as well as 4 unmeasured urine voids.  She was noted to be hypertensive overnight. She has been started on steroids per neurology.  She confirms on toprol-XL  Review of systems:  She denies shortness of breath or chest pain She had nausea with vomiting this AM - threw up the meatloaf from last night.  She hadn't been having nausea prior to that    Intake/Output Summary (Last 24 hours) at 11/08/2022 0835 Last data filed at 11/08/2022 0743 Gross per 24 hour  Intake 2284.6 ml  Output 400 ml  Net 1884.6 ml    Vitals:  Vitals:   11/07/22 2256 11/08/22 0427 11/08/22 0612 11/08/22 0818  BP: (!) 155/101 (!) 162/110 (!) 151/103 (!) 146/94  Pulse: 67 72 83 76  Resp: 20 18  17   Temp: (!) 97.4 F (36.3 C) 98 F (36.7 C)  97.9 F (36.6 C)  TempSrc: Axillary Oral  Oral  SpO2: 96% 98% 97% 98%  Weight:      Height:         Physical Exam:  General adult female in bed in no acute distress HEENT normocephalic atraumatic extraocular movements intact sclera anicteric Neck supple trachea midline Lungs clear to auscultation bilaterally normal work of breathing at rest  Heart S1S2 no rub Abdomen soft nontender nondistended Extremities no edema  Psych normal mood and affect Neuro - alert and oriented x 3 provides hx and follows commands; seems a little stronger   Medications reviewed   Labs:     Latest Ref Rng & Units 11/08/2022    5:17 AM 11/07/2022    6:07 AM 11/06/2022    6:20 AM  BMP  Glucose 70 - 99 mg/dL 409  811  94   BUN 6 - 20 mg/dL 21  26  26    Creatinine 0.44 - 1.00 mg/dL 9.14  7.82  9.56   Sodium 135 - 145 mmol/L 140  147  142   Potassium 3.5 - 5.1 mmol/L 4.6  4.8  4.1   Chloride 98 - 111 mmol/L 118  122  118   CO2 22 - 32 mmol/L 16  15  17    Calcium 8.9 - 10.3  mg/dL 8.2  8.3  7.9      Assessment/Plan:   # AKI - Secondary to rhabdomyolysis - Less concern for lupus nephritis given her improvement with supportive measures thus far and her UA - can stop IV fluids (see these are off) - sending urine protein/cr ratio as not yet done    # Rhabdomyolysis - s/p aggressive fluids - We have reached out to pharmacy to see if any causative agents.  Per my basic review, hydroxychloroquine can theoretically cause myositis but would seem less likely than lupus or MS.    # Muscle weakness - unclear if related to MS or myositis from lupus or something else.    - appreciate neurology - they feel most likely secondary to lupus myositis - She is on prednisone per neurology.  She is on GI ppx - s/p muscle biopsy on 10/3 - results pending    # SLE/lupus - patient had otherwise been doing well on saphnelo per her rheumatologist   # MS  - Per neurology   # HTN  - start back home metoprolol succinate 25  mg daily  # Normocytic anemia - may be secondary to chronic disease - s/p aggressive fluid resuscitation with rhabdo   Disposition per primary team.  Nephrology will sign off.  Given presence of protein in the urine, I will set up outpatient nephrology follow-up in 4-6 weeks.  Getting up/cr ratio today as she was not yet done  Estanislado Emms, MD 11/08/2022 8:58 AM   Mild proteinuria in lupus patient   Up/cr ratio is 440 mg/g  Follow-up with nephrology as above.  We will sign off.   Estanislado Emms, MD 2:00 PM 11/08/2022

## 2022-11-08 NOTE — Plan of Care (Signed)
  Problem: Education: Goal: Knowledge of General Education information will improve Description: Including pain rating scale, medication(s)/side effects and non-pharmacologic comfort measures Outcome: Progressing   Problem: Health Behavior/Discharge Planning: Goal: Ability to manage health-related needs will improve Outcome: Progressing   Problem: Clinical Measurements: Goal: Ability to maintain clinical measurements within normal limits will improve Outcome: Progressing   Problem: Clinical Measurements: Goal: Will remain free from infection Outcome: Progressing   Problem: Skin Integrity: Goal: Risk for impaired skin integrity will decrease Outcome: Progressing   Problem: Safety: Goal: Ability to remain free from injury will improve Outcome: Progressing   Problem: Pain Managment: Goal: General experience of comfort will improve Outcome: Progressing

## 2022-11-08 NOTE — Progress Notes (Signed)
PROGRESS NOTE  Emily Hicks:[id number] DOB: 1985/06/09   PCP: Patient, No Pcp Per  Patient is from: Home.  DOA: 11/04/2022 LOS: 4  Chief complaints Chief Complaint  Patient presents with   Weakness     Brief Narrative / Interim history: 37 year old F with PMH of MS, lupus pericarditis, pancytopenia and obesity presenting with generalized weakness and progressive lower extremity weakness with pain for about 2 weeks, and admitted with working diagnosis of SLE, AKI and rhabdomyolysis.  Per patient, symptoms felt like lupus flare.  In ED, vital stable. Cr 2.24 (baseline about 0.6).  BUN 57.  AST 391.  ALT 105.  CK [id number].  Troponin negative.  CRP 7.3.  WBC 3.3.  Hgb 10.9.  UA without significant finding other than large Hgb without significant RBC.  CXR and KUB without significant finding.  Renal US ordered.  Neurology and nephrology consulted.  Patient was started on IV fluid.   Renal ultrasound without acute finding.  CK trended up to 49,400.  Nephrology discussed with patient's rheumatologist who recommended muscle biopsy and steroid.  MRI pelvis and bilateral femur multifocal myositis, persistent and nonspecific bilateral external iliac and inguinal LAD that has progressed from prior CT. Patient underwent muscle biopsy by general surgery on 10/3.  Pathology result pending.  Started on prednisone by neurology.  Renal function and rhabdomyolysis improved.  Nephrology signed off but will arrange outpatient follow-up for proteinuria  Subjective: Seen and examined earlier this morning.  No major events overnight of this morning.  No complaints.  Feels better.  Walked in the hallway with therapy yesterday.  AKI and rhabdomyolysis improved.  BP elevated.  Objective: Vitals:   11/07/22 2256 11/08/22 0427 11/08/22 0612 11/08/22 0818  BP: (!) 155/101 (!) 162/110 (!) 151/103 (!) 146/94  Pulse: 67 72 83 76  Resp: 20 18  17   Temp: (!) 97.4 F (36.3 C) 98 F (36.7 C)  97.9 F (36.6 C)   TempSrc: Axillary Oral  Oral  SpO2: 96% 98% 97% 98%  Weight:      Height:        Examination:  GENERAL: No apparent distress.  Nontoxic. HEENT: MMM.  Vision and hearing grossly intact.  NECK: Supple.  No apparent JVD.  RESP:  No IWOB.  Fair aeration bilaterally. CVS:  RRR. Heart sounds normal.  ABD/GI/GU: BS+. Abd soft, NTND.  MSK/EXT: Extremity weakness improved.  No edema. SKIN: no apparent skin lesion or wound.  Biopsy site DCI. NEURO: Awake, alert and oriented appropriately.  Extremity weakness improved.  About 4+/5 in all extremities PSYCH: Calm. Normal affect.   Procedures:  10/3-muscle biopsy by general surgery  Microbiology summarized: COVID-19 PCR nonreactive Urine culture with multiple species.  Assessment and plan: Active Problems:   SLE (systemic lupus erythematosus related syndrome) (HCC)   Normocytic anemia   Obesity, Class I, BMI 30.0-34.9 (see actual BMI)   Multiple sclerosis (HCC)   Elevated liver enzymes   Essential hypertension   AKI (acute kidney injury) (HCC)   Rhabdomyolysis   Generalized and lower extremity weakness and pain: Likely due to rhabdo and myositis.  No report of trauma, exertion or excessive dehydration.  MRI pelvis and bilateral femur showed myositis.  Concerned about lupus myositis.  Inflammatory markers elevated.  Complement levels low.  dsDNA slightly elevated.  No chest pain or dyspnea.  CK 23K>49k>>2k.  Symptoms improved. -Appreciate help by nephrology, neurology, rheumatology and surgery -Discontinue IV fluid -Neurology recommended prednisone for 6 weeks with slow taper after that -  Protonix for GI prophylaxis -Will start Bactrim DS daily 2 weeks later for PJP prophylaxis -Follow muscle biopsy results -PT/OT  Nontraumatic rhabdomyolysis: Concerned about myopathy from lupus flareup.  MRI pelvis and bilateral femur concerning for severe myositis.  Inflammatory markers elevated.  Improving. -Monitor off IV fluid  SLE with  possible flare: On Plaquenil, azathioprine and Saphnelo outpatient.  Recently finished steroid course.  Reportedly she was on CellCept in the past but it stopped working.  Complement levels low.  ds DNA slightly elevated. -Continue Plaquenil and Imuran -Steroid as above.  AKI: Likely due to rhabdo.  Renal US without acute finding.  Low suspicion for lupus nephritis per nephrology.  Improved. Recent Labs    06/11/22 1135 06/16/22 1433 06/16/22 1835 06/17/22 0142 11/04/22 1811 11/05/22 0534 11/06/22 0620 11/07/22 0607 11/08/22 0517  BUN 9 9  --  12 57* 49* 26* 26* 21*  CREATININE 0.76 0.79 0.74 0.65 2.24* 1.82* 1.51* 1.34* 1.08*  -Monitor off IV fluid -Nephrology signed off and will arrange outpatient follow-up  Hypernatremia/hyperchloremia: Resolved -Continue IV fluid  Non-anion gap metabolic acidosis: Likely due to IV fluid.  Improved -Continue p.o. sodium bicarbonate -Stop IV fluid   Normocytic anemia: Anemia panel consistent with anemia of chronic disease.  Drop in Hgb likely dilutional Recent Labs    06/11/22 1135 06/16/22 1433 06/16/22 1835 06/17/22 0142 11/04/22 1811 11/05/22 0534 11/06/22 0620 11/07/22 0607 11/08/22 0517  HGB 12.1 11.4* 11.6* 10.3* 10.9* 9.4* 9.0* 8.6* 9.3*  -Continue monitoring  Multiple sclerosis (HCC) -Defer to neurology   Elevated liver enzymes: Pattern consistent with rhabdo.  Acute hepatitis panel negative.  RUQ [place] without acute finding.  Improved. -Continue monitoring   Essential hypertension: BP elevated -Resume home Toprol-XL -Discontinue IV fluid -IV labetalol as needed  Leukopenia and anemia: Leukopenia resolved.  Anemia improved.  MRI pelvis showed persistent nonspecific bilateral external iliac and inguinal LAD.  Improved. -May need outpatient follow-up with hematology  Hypomagnesemia -Monitor replenish as appropriate  Class II obesity/inadequate oral intake: Prealbumin very low. Body mass index is 38.92  kg/m. Nutrition Problem: Inadequate oral intake Etiology: poor appetite Signs/Symptoms: per patient/family report Interventions: Refer to RD note for recommendations   DVT prophylaxis:  SCDs Start: 11/04/22 2238  Code Status: Full code Family Communication: None at bedside today. Level of care: Med-Surg Status is: Inpatient Remains inpatient appropriate because: Rhabdomyolysis, bilateral lower extremity weakness and AKI   Final disposition: Home Consultants:  Nephrology Neurology General Surgery Patient's outpatient rheumatologist  35 minutes with more than 50% spent in reviewing records, counseling patient/family and coordinating care.   Sch Meds:  Scheduled Meds:  azaTHIOprine  150 mg Oral Daily   feeding supplement  237 mL Oral BID BM   gabapentin  300 mg Oral TID   hydroxychloroquine  200 mg Oral BID   metoprolol succinate  25 mg Oral Daily   multivitamin with minerals  1 tablet Oral Daily   pantoprazole  40 mg Oral Daily   predniSONE  50 mg Oral Q breakfast   Continuous Infusions:  magnesium sulfate bolus IVPB 2 g (11/08/22 0917)   PRN Meds:.labetalol, melatonin, ondansetron **OR** ondansetron (ZOFRAN) IV  Antimicrobials: Anti-infectives (From admission, onward)    Start     Dose/Rate Route Frequency Ordered Stop   11/08/22 1000  sulfamethoxazole-trimethoprim (BACTRIM DS) 800-160 MG per tablet 1 tablet  Status:  Discontinued       Note to Pharmacy: While on steroids   1 tablet Oral Daily 11/08/22 0741 11/08/22 0741  11/07/22 2200  sulfamethoxazole-trimethoprim (BACTRIM DS) 800-160 MG per tablet 1 tablet  Status:  Discontinued       Note to Pharmacy: While on steroids   1 tablet Oral Every 12 hours 11/07/22 1510 11/08/22 0741   11/06/22 1030  ceFAZolin (ANCEF) IVPB 2g/100 mL premix        2 g 200 mL/hr over 30 Minutes Intravenous On call to O.R. 11/06/22 0942 11/06/22 1132   11/05/22 0100  hydroxychloroquine (PLAQUENIL) tablet 200 mg        200 mg Oral 2  times daily 11/04/22 2237          I have personally reviewed the following labs and images: CBC: Recent Labs  Lab 11/04/22 1811 11/05/22 0534 11/06/22 0620 11/07/22 0607 11/08/22 0517  WBC 3.3* 2.7* 2.6* 3.5* 7.8  HGB 10.9* 9.4* 9.0* 8.6* 9.3*  HCT 34.5* 29.7* 29.4* 28.1* 30.0*  MCV 84.8 83.0 84.5 85.4 84.7  PLT 185 189 278 283 355   BMP &GFR Recent Labs  Lab 11/04/22 1811 11/04/22 2243 11/05/22 0534 11/06/22 0620 11/07/22 0607 11/08/22 0517  NA 138  --  138 142 147* 140  K 3.6  --  3.4* 4.1 4.8 4.6  CL 104  --  110 118* 122* 118*  CO2 20*  --  20* 17* 15* 16*  GLUCOSE 97  --  105* 94 136* 127*  BUN 57*  --  49* 26* 26* 21*  CREATININE 2.24*  --  1.82* 1.51* 1.34* 1.08*  CALCIUM 8.9  --  8.2* 7.9* 8.3* 8.2*  MG  --  2.1 2.1 1.8 1.5* 1.6*  PHOS  --  3.4 3.5 2.6  --  3.3   Estimated Creatinine Clearance: 75 mL/min (A) (by C-G formula based on SCr of 1.08 mg/dL (H)). Liver & Pancreas: Recent Labs  Lab 11/04/22 1811 11/05/22 0534 11/06/22 0620 11/07/22 0607 11/08/22 0517  AST 391* 317* 215* 134* 92*  ALT 105* 88* 70* 61* 56*  ALKPHOS 39 35* 32* 37* 38  BILITOT 0.6 0.3 0.4 0.3 0.3  PROT 7.3 5.9* 5.2* 5.0* 5.9*  ALBUMIN 3.0* 2.2* 2.0* 1.8* 2.2*   No results for input(s): "LIPASE", "AMYLASE" in the last 168 hours. No results for input(s): "AMMONIA" in the last 168 hours. Diabetic: No results for input(s): "HGBA1C" in the last 72 hours. No results for input(s): "GLUCAP" in the last 168 hours. Cardiac Enzymes: Recent Labs  Lab 11/05/22 2157 11/06/22 0620 11/06/22 1503 11/07/22 0607 11/08/22 0517  CKTOTAL 10,783* 8,947* 7,772* 3,208* 2,003*   No results for input(s): "PROBNP" in the last 8760 hours. Coagulation Profile: No results for input(s): "INR", "PROTIME" in the last 168 hours. Thyroid Function Tests: No results for input(s): "TSH", "T4TOTAL", "FREET4", "T3FREE", "THYROIDAB" in the last 72 hours.  Lipid Profile: No results for input(s):  "CHOL", "HDL", "LDLCALC", "TRIG", "CHOLHDL", "LDLDIRECT" in the last 72 hours. Anemia Panel: No results for input(s): "VITAMINB12", "FOLATE", "FERRITIN", "TIBC", "IRON", "RETICCTPCT" in the last 72 hours.  Urine analysis:    Component Value Date/Time   COLORURINE YELLOW 11/05/2022 1240   APPEARANCEUR HAZY (A) 11/05/2022 1240   LABSPEC 1.017 11/05/2022 1240   PHURINE 6.0 11/05/2022 1240   GLUCOSEU NEGATIVE 11/05/2022 1240   HGBUR MODERATE (A) 11/05/2022 1240   BILIRUBINUR NEGATIVE 11/05/2022 1240   BILIRUBINUR Neg 05/08/2010 1553   KETONESUR NEGATIVE 11/05/2022 1240   PROTEINUR 30 (A) 11/05/2022 1240   UROBILINOGEN 1.0 11/11/2013 0150   NITRITE NEGATIVE 11/05/2022 1240   LEUKOCYTESUR NEGATIVE  11/05/2022 1240   Sepsis Labs: Invalid input(s): "PROCALCITONIN", "LACTICIDVEN"  Microbiology: Recent Results (from the past 240 hour(s))  Urine Culture     Status: Abnormal   Collection Time: 11/04/22  6:11 PM   Specimen: Urine, Clean Catch  Result Value Ref Range Status   Specimen Description   Final    URINE, CLEAN CATCH Performed at [facility], [address], [place], [place] [address]    Special Requests   Final    NONE Performed at [facility], [address] [place], [place] [address]    Culture MULTIPLE SPECIES PRESENT, SUGGEST RECOLLECTION (A)  Final   Report Status 11/06/2022 FINAL  Final  SARS Coronavirus 2 by RT PCR (hospital order, performed in [facility] hospital lab) *cepheid single result test* Anterior Nasal Swab     Status: None   Collection Time: 11/04/22 10:50 PM   Specimen: Anterior Nasal Swab  Result Value Ref Range Status   SARS Coronavirus 2 by RT PCR NEGATIVE NEGATIVE Final    Comment: (NOTE) SARS-CoV-2 target nucleic acids are NOT DETECTED.  The SARS-CoV-2 RNA is generally detectable in upper and lower respiratory specimens during the acute phase of infection. The lowest concentration of SARS-CoV-2 viral copies  this assay can detect is 250 copies / mL. A negative result does not preclude SARS-CoV-2 infection and should not be used as the sole basis for treatment or other patient management decisions.  A negative result may occur with improper specimen collection / handling, submission of specimen other than nasopharyngeal swab, presence of viral mutation(s) within the areas targeted by this assay, and inadequate number of viral copies (<250 copies / mL). A negative result must be combined with clinical observations, patient history, and epidemiological information.  Fact Sheet for Patients:   RoadLapTop.co.za  Fact Sheet for Healthcare Providers: http://kim-miller.com/  This test is not yet approved or  cleared by the [place] FDA and has been authorized for detection and/or diagnosis of SARS-CoV-2 by FDA under an Emergency Use Authorization (EUA).  This EUA will remain in effect (meaning this test can be used) for the duration of the COVID-19 declaration under Section 564(b)(1) of the Act, 21 U.S.C. section 360bbb-3(b)(1), unless the authorization is terminated or revoked sooner.  Performed at [facility], [address] [place], [place] [address]     Radiology Studies: No results found.    Averie Meiner T. Sinthia Karabin [facility]  If 7PM-7AM, please contact night-coverage [email or web address] 11/08/2022, 9:36 AM

## 2022-11-08 NOTE — Progress Notes (Signed)
[facility] night cross cover note:   I was notified by RN of the patient's most recent blood pressure of 162/110, with heart rates in the 70s.  Not currently on any antihypertensive medications.  I subsequently added prn IV hydralazine for systolic blood pressure greater than 170 mmHg or diastolic blood pressure greater than 100 mmHg.     Newton Pigg, DO Hospitalist

## 2022-11-08 NOTE — Plan of Care (Signed)

## 2022-11-09 DIAGNOSIS — R748 Abnormal levels of other serum enzymes: Secondary | ICD-10-CM | POA: Diagnosis not present

## 2022-11-09 DIAGNOSIS — D649 Anemia, unspecified: Secondary | ICD-10-CM | POA: Diagnosis not present

## 2022-11-09 DIAGNOSIS — I10 Essential (primary) hypertension: Secondary | ICD-10-CM | POA: Diagnosis not present

## 2022-11-09 DIAGNOSIS — N179 Acute kidney failure, unspecified: Secondary | ICD-10-CM | POA: Diagnosis not present

## 2022-11-09 DIAGNOSIS — M609 Myositis, unspecified: Secondary | ICD-10-CM | POA: Diagnosis not present

## 2022-11-09 DIAGNOSIS — R29898 Other symptoms and signs involving the musculoskeletal system: Secondary | ICD-10-CM | POA: Diagnosis not present

## 2022-11-09 LAB — COMPREHENSIVE METABOLIC PANEL
ALT: 44 U/L (ref 0–44)
AST: 56 U/L — ABNORMAL HIGH (ref 15–41)
Albumin: 2.3 g/dL — ABNORMAL LOW (ref 3.5–5.0)
Alkaline Phosphatase: 45 U/L (ref 38–126)
Anion gap: 6 (ref 5–15)
BUN: 21 mg/dL — ABNORMAL HIGH (ref 6–20)
CO2: 18 mmol/L — ABNORMAL LOW (ref 22–32)
Calcium: 8.4 mg/dL — ABNORMAL LOW (ref 8.9–10.3)
Chloride: 118 mmol/L — ABNORMAL HIGH (ref 98–111)
Creatinine, Ser: 1.38 mg/dL — ABNORMAL HIGH (ref 0.44–1.00)
GFR, Estimated: 51 mL/min — ABNORMAL LOW (ref >60)
Glucose, Bld: 99 mg/dL (ref 70–99)
Potassium: 4.2 mmol/L (ref 3.5–5.1)
Sodium: 142 mmol/L (ref 135–145)
Total Bilirubin: 0.3 mg/dL (ref 0.3–1.2)
Total Protein: 5.9 g/dL — ABNORMAL LOW (ref 6.5–8.1)

## 2022-11-09 LAB — CBC
HCT: 34.1 % — ABNORMAL LOW (ref 36.0–46.0)
Hemoglobin: 10.5 g/dL — ABNORMAL LOW (ref 12.0–15.0)
MCH: 26.3 pg (ref 26.0–34.0)
MCHC: 30.8 g/dL (ref 30.0–36.0)
MCV: 85.5 fL (ref 80.0–100.0)
Platelets: 389 10*3/uL (ref 150–400)
RBC: 3.99 MIL/uL (ref 3.87–5.11)
RDW: 14.6 % (ref 11.5–15.5)
WBC: 7.8 10*3/uL (ref 4.0–10.5)
nRBC: 0 % (ref 0.0–0.2)

## 2022-11-09 LAB — CK: Total CK: 836 U/L — ABNORMAL HIGH (ref 38–234)

## 2022-11-09 LAB — MAGNESIUM: Magnesium: 1.9 mg/dL (ref 1.7–2.4)

## 2022-11-09 MED ORDER — PREDNISONE 50 MG PO TABS: 50.0000 mg | ORAL_TABLET | Freq: Every day | ORAL | 0 refills | Status: AC

## 2022-11-09 MED ORDER — SULFAMETHOXAZOLE-TRIMETHOPRIM 800-160 MG PO TABS: 1.0000 | ORAL_TABLET | Freq: Every day | ORAL | 0 refills | Status: DC

## 2022-11-09 MED ORDER — PANTOPRAZOLE SODIUM 40 MG PO TBEC: 40.0000 mg | DELAYED_RELEASE_TABLET | Freq: Every day | ORAL | 0 refills | Status: DC

## 2022-11-09 NOTE — Discharge Summary (Signed)
Physician Discharge Summary  Emily Hicks WUJ:[id number] DOB: 10/26/1985 DOA: 11/04/2022  PCP: Patient, No Pcp Per  Admit date: 11/04/2022 Discharge date: 11/09/2022 Admitted From: Home Disposition: Home Recommendations for Outpatient Follow-up:  Follow-up with PCP and rheumatology in 1 to 2 weeks Nephrology to arrange outpatient follow-up. Consider hematology referral for iliac and inguinal lymphadenopathy Check CK, CBC and CMP at follow-up Patient is discharged on prednisone 50 mg daily for 6 weeks.  Patient needs needs slow taper after 6 weeks. Please follow up on the following pending results: Muscle biopsy result  Home Health: None Equipment/Devices: None  Discharge Condition: Stable CODE STATUS: Full code  Follow-up Information     [facility] Follow up.   Specialty: General Surgery Why: As needed, for wound re-check if any issues or concers. Please let us know! Contact information: [address] [place] [address] [phone]                Hospital course 37 year old F with PMH of MS, lupus pericarditis, pancytopenia and obesity presenting with generalized weakness and progressive lower extremity weakness with pain for about 2 weeks, and admitted with working diagnosis of SLE, AKI and rhabdomyolysis.  Per patient, symptoms felt like lupus flare.  In ED, vital stable. Cr 2.24 (baseline about 0.6).  BUN 57.  AST 391.  ALT 105.  CK [id number].  Troponin negative.  CRP 7.3.  WBC 3.3.  Hgb 10.9.  UA without significant finding other than large Hgb without significant RBC.  CXR and KUB without significant finding.  Renal US ordered.  Neurology and nephrology consulted.  Patient was started on IV fluid.    Renal ultrasound without acute finding.  CK trended up to 49,400.  Nephrology discussed with patient's rheumatologist who recommended muscle biopsy and steroid.  MRI pelvis and bilateral femur multifocal myositis,  persistent and nonspecific bilateral external iliac and inguinal LAD that has progressed from prior CT. Patient underwent muscle biopsy by general surgery on 10/3.  Pathology result pending.  Started on prednisone by neurology.  Renal function and rhabdomyolysis improved.  Nephrology signed off but will arrange outpatient follow-up for proteinuria.  Patient is discharged on prednisone 50 mg daily for 6 weeks per recommendation by neurology.  Needs slow taper after 6 weeks.  She will be on p.o. Protonix 40 mg daily for GI prophylaxis.  Will start Bactrim DS daily for PJP prophylaxis after 2 weeks.  See individual problem list below for more.   Problems addressed during this hospitalization Active Problems:   SLE (systemic lupus erythematosus related syndrome) (HCC)   Normocytic anemia   Obesity, Class I, BMI 30.0-34.9 (see actual BMI)   Multiple sclerosis (HCC)   Elevated liver enzymes   Essential hypertension   AKI (acute kidney injury) (HCC)   Rhabdomyolysis   Generalized and lower extremity weakness and pain: Likely due to rhabdo and myositis.  No report of trauma, exertion or excessive dehydration.  MRI pelvis and bilateral femur showed myositis.  Concerned about lupus myositis.  Inflammatory markers elevated.  Complement levels low.  dsDNA slightly elevated.  No chest pain or dyspnea.  CK 23K>49k>>>> 800.   Motor 5/5 in all extremities on day of discharge. -Prednisone 50 mg daily for 6 weeks followed by slow taper per neurology -P.o. tonics for GI prophylaxis -Bactrim DS daily for PJP prophylaxis to start 2 weeks later -Follow muscle biopsy results   Nontraumatic rhabdomyolysis: Concerned about myopathy from lupus flareup.  MRI  pelvis and bilateral femur concerning for severe myositis.  Inflammatory markers elevated.  Improved and continued to improve off IV fluid -Management as above   SLE with possible flare: On Plaquenil, azathioprine and Saphnelo outpatient.  Recently finished  steroid course.  Reportedly she was on CellCept in the past but it stopped working.  Complement levels low.  ds DNA slightly elevated. -Continue Plaquenil and Imuran -Steroid as above.   AKI: Likely due to rhabdo.  Renal US without acute finding.  Low suspicion for lupus nephritis per nephrology.  UPC 0.44.  Creatinine slightly up after stopping IV fluid Recent Labs    06/11/22 1135 06/16/22 1433 06/16/22 1835 06/17/22 0142 11/04/22 1811 11/05/22 0534 11/06/22 0620 11/07/22 0607 11/08/22 0517 11/09/22 0725  BUN 9 9  --  12 57* 49* 26* 26* 21* 21*  CREATININE 0.76 0.79 0.74 0.65 2.24* 1.82* 1.51* 1.34* 1.08* 1.38*  -Recheck renal function in 1 week -Encouraged to maintain good hydration -Advised to avoid NSAID -Nephrology to arrange outpatient follow-up  Hypernatremia/hyperchloremia: Resolved   Non-anion gap metabolic acidosis: Likely due to IV fluid.  Improved -Recheck at follow-up  Normocytic anemia: Anemia panel consistent with anemia of chronic disease.  Drop in Hgb likely dilutional Recent Labs    06/11/22 1135 06/16/22 1433 06/16/22 1835 06/17/22 0142 11/04/22 1811 11/05/22 0534 11/06/22 0620 11/07/22 0607 11/08/22 0517 11/09/22 0725  HGB 12.1 11.4* 11.6* 10.3* 10.9* 9.4* 9.0* 8.6* 9.3* 10.5*  -Check CBC in 1 week  Multiple sclerosis (HCC) -Defer to neurology   Elevated liver enzymes: Pattern consistent with rhabdo.  Acute hepatitis panel negative.  RUQ [place] without acute finding.  Resolving.   Essential hypertension: BP elevated -Resume home Toprol-XL -Reassess blood pressure and adjust meds as appropriate   Leukopenia and anemia: Leukopenia resolved.  Anemia improved.  MRI pelvis showed persistent nonspecific bilateral external iliac and inguinal LAD.   -Consider ambulatory referral to hematology outpatient   Hypomagnesemia: Resolved   Class II obesity/inadequate oral intake: Prealbumin very low. Body mass index is 38.92 kg/m. Nutrition Problem:  Inadequate oral intake Etiology: poor appetite Signs/Symptoms: per patient/family report Interventions: Refer to RD note for recommendations     Time spent 45 minutes  Vital signs Vitals:   11/08/22 1611 11/08/22 1948 11/09/22 0334 11/09/22 0808  BP: (!) 150/98 (!) 148/91 (!) 157/103 135/84  Pulse: 73 71 72 78  Temp: 98 F (36.7 C) 98 F (36.7 C) 98 F (36.7 C) 97.9 F (36.6 C)  Resp: 18 18 18 18   Height:      Weight:      SpO2: 98% 98% 98% 96%  TempSrc: Oral Oral  Oral  BMI (Calculated):         Discharge exam  GENERAL: No apparent distress.  Nontoxic. HEENT: MMM.  Vision and hearing grossly intact.  NECK: Supple.  No apparent JVD.  RESP:  No IWOB.  Fair aeration bilaterally. CVS:  RRR. Heart sounds normal.  ABD/GI/GU: BS+. Abd soft, NTND.  MSK/EXT:  Moves extremities. No apparent deformity. No edema.  SKIN: no apparent skin lesion or wound NEURO: Awake and alert. Oriented appropriately.  No apparent focal neuro deficit.  Motor 5/5 in all extremities PSYCH: Calm. Normal affect.   Discharge Instructions Discharge Instructions     Diet - low sodium heart healthy   Complete by: As directed    Discharge instructions   Complete by: As directed    It has been a pleasure taking care of you!  You were hospitalized  due to rhabdomyolysis (muscle cell breakdown) likely from lupus for which you have been treated with intravenous fluid and a steroid.  We are discharging you on more steroid to complete treatment course.  Recommend maintaining good hydration.  Avoid any over-the-counter pain medication other than plain Tylenol.  Follow-up with your primary care doctor and rheumatologist in 1 to 2 weeks or sooner if needed.  Nephrology (kidney doctor) will arrange outpatient follow-up.  Please review your new medication list and the directions on your medications before you take them.   Take care,   Increase activity slowly   Complete by: As directed       Allergies as of  11/09/2022   No Known Allergies      Medication List     STOP taking these medications    Excedrin Extra Strength 250-250-65 MG tablet Generic drug: aspirin-acetaminophen-caffeine   ibuprofen 200 MG tablet Commonly known as: ADVIL   tamsulosin 0.4 MG Caps capsule Commonly known as: FLOMAX       TAKE these medications    azathioprine 100 MG tablet Commonly known as: IMURAN Take 150 mg by mouth daily.   clobetasol ointment 0.05 % Commonly known as: TEMOVATE Apply 1 application  topically 2 (two) times daily as needed (as directed).   gabapentin 300 MG capsule Commonly known as: NEURONTIN Take 1 capsule (300 mg total) by mouth 3 (three) times daily.   hydroxychloroquine 200 MG tablet Commonly known as: PLAQUENIL Take 200 mg by mouth 2 (two) times daily.   ketoconazole 2 % shampoo Commonly known as: NIZORAL Apply 1 Application topically 2 (two) times a week.   metoprolol succinate 25 MG 24 hr tablet Commonly known as: Toprol XL Take 1 tablet (25 mg total) by mouth at bedtime.   pantoprazole 40 MG tablet Commonly known as: PROTONIX Take 1 tablet (40 mg total) by mouth daily. Start taking on: November 10, 2022   predniSONE 50 MG tablet Commonly known as: DELTASONE Take 1 tablet (50 mg total) by mouth daily with breakfast. What changed:  medication strength how much to take when to take this additional instructions   sulfamethoxazole-trimethoprim 800-160 MG tablet Commonly known as: BACTRIM DS Take 1 tablet by mouth daily. Start taking on: November 23, 2022   Tylenol 325 MG Caps Generic drug: Acetaminophen Take 325-650 mg by mouth every 6 (six) hours as needed (for headaches or pain).   Vitamin D (Ergocalciferol) 1.25 MG (50000 UNIT) Caps capsule Commonly known as: DRISDOL Take 1 capsule (50,000 Units total) by mouth every 7 (seven) days.        Consultations: Neurology Nephrology Patient's outpatient rheumatologist over the phone General  surgery  Procedures/Studies: Right thigh muscle biopsy   MR FEMUR LEFT WO CONTRAST  Result Date: 11/05/2022 CLINICAL DATA:  Bilateral hip with thigh and leg pain. History of systemic lupus erythematosus and multiple sclerosis. Evaluate for myositis. EXAM: MR OF THE LEFT FEMUR WITHOUT CONTRAST; MRI OF THE RIGHT FEMUR WITHOUT CONTRAST TECHNIQUE: Multiplanar, multisequence MR imaging of both thighs was performed. No intravenous contrast was administered. COMPARISON:  Pelvic CT 06/16/2022. FINDINGS: Pelvic MRI findings dictated separately. Bones/Joint/Cartilage No evidence of acute fracture, dislocation or osteonecrosis. No significant hip joint effusions. Moderate size nearly symmetric knee joint effusions bilaterally. Ligaments Examination not tailored for ligamentous evaluation. No ligamentous abnormalities identified. Muscles and Tendons There is severe, nearly symmetric T2 hyperintensity throughout the musculature of the lower pelvis and proximal thighs. Pelvic findings are dictated separately. There is diffuse involvement of the  hip adductor musculature bilaterally. There is also lesser fairly symmetric involvement of the vastus intermedius muscles bilaterally. There is asymmetric involvement of the left semitendinosus and biceps muscles. No focal atrophy, fluid collection or tendon tear identified. Soft tissues No significant subcutaneous inflammatory changes or focal fluid collections. As described on separate pelvic MRI, there is bilateral external and inguinal lymphadenopathy which has progressed from previous CT. IMPRESSION: 1. Severe, nearly symmetric T2 hyperintensity throughout the musculature of the lower pelvis and proximal thighs bilaterally consistent with myositis. There is asymmetric involvement of the left semitendinosus and biceps muscles. 2. No focal muscular atrophy or fluid collection identified. 3. No acute osseous findings. 4. Bilateral external and inguinal lymphadenopathy, further  discussed on pelvic MRI. Electronically Signed   By: Carey Bullocks M.D.   On: 11/05/2022 16:54   MR BOFBP RIGHT WO CONTRAST  Result Date: 11/05/2022 CLINICAL DATA:  Bilateral hip with thigh and leg pain. History of systemic lupus erythematosus and multiple sclerosis. Evaluate for myositis. EXAM: MR OF THE LEFT FEMUR WITHOUT CONTRAST; MRI OF THE RIGHT FEMUR WITHOUT CONTRAST TECHNIQUE: Multiplanar, multisequence MR imaging of both thighs was performed. No intravenous contrast was administered. COMPARISON:  Pelvic CT 06/16/2022. FINDINGS: Pelvic MRI findings dictated separately. Bones/Joint/Cartilage No evidence of acute fracture, dislocation or osteonecrosis. No significant hip joint effusions. Moderate size nearly symmetric knee joint effusions bilaterally. Ligaments Examination not tailored for ligamentous evaluation. No ligamentous abnormalities identified. Muscles and Tendons There is severe, nearly symmetric T2 hyperintensity throughout the musculature of the lower pelvis and proximal thighs. Pelvic findings are dictated separately. There is diffuse involvement of the hip adductor musculature bilaterally. There is also lesser fairly symmetric involvement of the vastus intermedius muscles bilaterally. There is asymmetric involvement of the left semitendinosus and biceps muscles. No focal atrophy, fluid collection or tendon tear identified. Soft tissues No significant subcutaneous inflammatory changes or focal fluid collections. As described on separate pelvic MRI, there is bilateral external and inguinal lymphadenopathy which has progressed from previous CT. IMPRESSION: 1. Severe, nearly symmetric T2 hyperintensity throughout the musculature of the lower pelvis and proximal thighs bilaterally consistent with myositis. There is asymmetric involvement of the left semitendinosus and biceps muscles. 2. No focal muscular atrophy or fluid collection identified. 3. No acute osseous findings. 4. Bilateral external  and inguinal lymphadenopathy, further discussed on pelvic MRI. Electronically Signed   By: Carey Bullocks M.D.   On: 11/05/2022 16:54   MR PELVIS WO CONTRAST  Result Date: 11/05/2022 CLINICAL DATA:  Bilateral hip with thigh and leg pain. History of systemic lupus erythematosus and multiple sclerosis. Evaluate for myositis. EXAM: MRI PELVIS WITHOUT CONTRAST TECHNIQUE: Multiplanar multisequence MR imaging of the pelvis was performed. No intravenous contrast was administered. COMPARISON:  Abdominopelvic CT 06/16/2022. FINDINGS: Bilateral thigh studies are dictated separately. Urinary Tract: The visualized distal ureters and bladder appear unremarkable. Bowel: No bowel wall thickening, distention or surrounding inflammation identified within the pelvis. Mildly prominent stool within the distal colon. Vascular/Lymphatic: Persistent bilateral external iliac and inguinal adenopathy, progressive from prior CT. Representative nodes include a right external iliac node measuring 1.1 cm short axis on image 29/6, a right inguinal node measuring 1.2 cm on image 38/6 and a left external iliac node measuring 1.5 cm on image 30/6. No significant vascular findings on noncontrast imaging. Reproductive: The uterus and ovaries appear unremarkable. No adnexal mass. Other: No significant pelvic ascites. Musculoskeletal: No acute or worrisome osseous findings. Mildly heterogeneous marrow signal. The hip and sacroiliac joints appear unremarkable. No significant  hip joint effusion. There is severe nearly symmetric T2 hyperintensity throughout the visualized musculature, including the psoas, iliacus, pelvic sidewall, gluteus and adductor musculature of both thighs. No focal fluid collection, focal muscular atrophy or acute tendon abnormalities are identified. IMPRESSION: 1. Severe, nearly symmetric T2 hyperintensity throughout the visualized musculature of the pelvis and thighs, consistent with multifocal myositis. No focal fluid  collection or focal muscular atrophy identified. 2. Persistent nonspecific bilateral external iliac and inguinal adenopathy, progressive from prior CT. Cannot exclude lymphoproliferative process. 3. No acute osseous findings or significant hip arthropathy. 4. Bilateral thigh studies are dictated separately. Electronically Signed   By: Carey Bullocks M.D.   On: 11/05/2022 16:42   US RENAL  Result Date: 11/04/2022 CLINICAL DATA:  Acute kidney injury EXAM: RENAL / URINARY TRACT ULTRASOUND COMPLETE COMPARISON:  CT 06/16/2022 FINDINGS: Right Kidney: Renal measurements: 11.5 x 4.8 x 5.7 cm = volume: 165 ML. Mildly increased echotexture. No mass or hydronephrosis. Left Kidney: Renal measurements: 12.7 x 6.3 x 5.7 cm = volume: 238 mL. Mildly increased echotexture. No mass or hydronephrosis. Bladder: Appears normal for degree of bladder distention. Other: None. IMPRESSION: No acute findings.  No hydronephrosis. Increased echotexture within the kidneys compatible with chronic medical renal disease. Electronically Signed   By: Charlett Nose M.D.   On: 11/04/2022 23:47   US Abdomen Limited RUQ (LIVER/GB)  Result Date: 11/04/2022 CLINICAL DATA:  Elevated LFTs. EXAM: ULTRASOUND ABDOMEN LIMITED RIGHT UPPER QUADRANT COMPARISON:  Right upper quadrant ultrasound dated 10/30/2010. FINDINGS: Gallbladder: Cholecystectomy Common bile duct: Diameter: 2 mm Liver: The liver is unremarkable as visualized. Portal vein is patent on color Doppler imaging with normal direction of blood flow towards the liver. Other: None. IMPRESSION: Cholecystectomy, otherwise unremarkable right upper quadrant ultrasound. Electronically Signed   By: Elgie Collard M.D.   On: 11/04/2022 23:23   DG Abd 1 View  Result Date: 11/04/2022 CLINICAL DATA:  Nausea and abdominal pain EXAM: ABDOMEN - 1 VIEW COMPARISON:  None Available. FINDINGS: Scattered large and small bowel gas is noted. No obstructive changes are noted. Changes of prior cholecystectomy are  seen. No free air is noted. No bony abnormality is noted. IMPRESSION: No acute abnormality seen. Electronically Signed   By: Alcide Clever M.D.   On: 11/04/2022 22:45   DG Chest Port 1 View  Result Date: 11/04/2022 CLINICAL DATA:  Weakness EXAM: PORTABLE CHEST 1 VIEW COMPARISON:  06/16/2018 FINDINGS: The heart size and mediastinal contours are within normal limits. Both lungs are clear. The visualized skeletal structures are unremarkable. IMPRESSION: No active disease. Electronically Signed   By: Alcide Clever M.D.   On: 11/04/2022 22:44       The results of significant diagnostics from this hospitalization (including imaging, microbiology, ancillary and laboratory) are listed below for reference.     Microbiology: Recent Results (from the past 240 hour(s))  Urine Culture     Status: Abnormal   Collection Time: 11/04/22  6:11 PM   Specimen: Urine, Clean Catch  Result Value Ref Range Status   Specimen Description   Final    URINE, CLEAN CATCH Performed at [facility], [address] [place], [place] [address]    Special Requests   Final    NONE Performed at [facility], [address], [place], [place] [address]    Culture MULTIPLE SPECIES PRESENT, SUGGEST RECOLLECTION (A)  Final   Report Status 11/06/2022 FINAL  Final  SARS Coronavirus 2 by RT PCR (hospital order, performed  in [facility] hospital lab) *cepheid single result test* Anterior Nasal Swab     Status: None   Collection Time: 11/04/22 10:50 PM   Specimen: Anterior Nasal Swab  Result Value Ref Range Status   SARS Coronavirus 2 by RT PCR NEGATIVE NEGATIVE Final    Comment: (NOTE) SARS-CoV-2 target nucleic acids are NOT DETECTED.  The SARS-CoV-2 RNA is generally detectable in upper and lower respiratory specimens during the acute phase of infection. The lowest concentration of SARS-CoV-2 viral copies this assay can detect is 250 copies / mL. A negative result does not preclude  SARS-CoV-2 infection and should not be used as the sole basis for treatment or other patient management decisions.  A negative result may occur with improper specimen collection / handling, submission of specimen other than nasopharyngeal swab, presence of viral mutation(s) within the areas targeted by this assay, and inadequate number of viral copies (<250 copies / mL). A negative result must be combined with clinical observations, patient history, and epidemiological information.  Fact Sheet for Patients:   RoadLapTop.co.za  Fact Sheet for Healthcare Providers: http://kim-miller.com/  This test is not yet approved or  cleared by the [place] FDA and has been authorized for detection and/or diagnosis of SARS-CoV-2 by FDA under an Emergency Use Authorization (EUA).  This EUA will remain in effect (meaning this test can be used) for the duration of the COVID-19 declaration under Section 564(b)(1) of the Act, 21 U.S.C. section 360bbb-3(b)(1), unless the authorization is terminated or revoked sooner.  Performed at [facility], [address] [place], [place] [address]      Labs:  CBC: Recent Labs  Lab 11/05/22 0534 11/06/22 0620 11/07/22 0607 11/08/22 0517 11/09/22 0725  WBC 2.7* 2.6* 3.5* 7.8 7.8  HGB 9.4* 9.0* 8.6* 9.3* 10.5*  HCT 29.7* 29.4* 28.1* 30.0* 34.1*  MCV 83.0 84.5 85.4 84.7 85.5  PLT 189 278 283 355 389   BMP &GFR Recent Labs  Lab 11/04/22 2243 11/05/22 0534 11/06/22 0620 11/07/22 0607 11/08/22 0517 11/09/22 0725  NA  --  138 142 147* 140 142  K  --  3.4* 4.1 4.8 4.6 4.2  CL  --  110 118* 122* 118* 118*  CO2  --  20* 17* 15* 16* 18*  GLUCOSE  --  105* 94 136* 127* 99  BUN  --  49* 26* 26* 21* 21*  CREATININE  --  1.82* 1.51* 1.34* 1.08* 1.38*  CALCIUM  --  8.2* 7.9* 8.3* 8.2* 8.4*  MG 2.1 2.1 1.8 1.5* 1.6* 1.9  PHOS 3.4 3.5 2.6  --  3.3  --    Estimated Creatinine Clearance:  58.7 mL/min (A) (by C-G formula based on SCr of 1.38 mg/dL (H)). Liver & Pancreas: Recent Labs  Lab 11/05/22 0534 11/06/22 0620 11/07/22 0607 11/08/22 0517 11/09/22 0725  AST 317* 215* 134* 92* 56*  ALT 88* 70* 61* 56* 44  ALKPHOS 35* 32* 37* 38 45  BILITOT 0.3 0.4 0.3 0.3 0.3  PROT 5.9* 5.2* 5.0* 5.9* 5.9*  ALBUMIN 2.2* 2.0* 1.8* 2.2* 2.3*   No results for input(s): "LIPASE", "AMYLASE" in the last 168 hours. No results for input(s): "AMMONIA" in the last 168 hours. Diabetic: No results for input(s): "HGBA1C" in the last 72 hours. No results for input(s): "GLUCAP" in the last 168 hours. Cardiac Enzymes: Recent Labs  Lab 11/06/22 0620 11/06/22 1503 11/07/22 0607 11/08/22 0517 11/09/22 0725  CKTOTAL 8,947* 7,772* 3,208* 2,003* 836*   No results  for input(s): "PROBNP" in the last 8760 hours. Coagulation Profile: No results for input(s): "INR", "PROTIME" in the last 168 hours. Thyroid Function Tests: No results for input(s): "TSH", "T4TOTAL", "FREET4", "T3FREE", "THYROIDAB" in the last 72 hours. Lipid Profile: No results for input(s): "CHOL", "HDL", "LDLCALC", "TRIG", "CHOLHDL", "LDLDIRECT" in the last 72 hours. Anemia Panel: No results for input(s): "VITAMINB12", "FOLATE", "FERRITIN", "TIBC", "IRON", "RETICCTPCT" in the last 72 hours. Urine analysis:    Component Value Date/Time   COLORURINE YELLOW 11/05/2022 1240   APPEARANCEUR HAZY (A) 11/05/2022 1240   LABSPEC 1.017 11/05/2022 1240   PHURINE 6.0 11/05/2022 1240   GLUCOSEU NEGATIVE 11/05/2022 1240   HGBUR MODERATE (A) 11/05/2022 1240   BILIRUBINUR NEGATIVE 11/05/2022 1240   BILIRUBINUR Neg 05/08/2010 1553   KETONESUR NEGATIVE 11/05/2022 1240   PROTEINUR 30 (A) 11/05/2022 1240   UROBILINOGEN 1.0 11/11/2013 0150   NITRITE NEGATIVE 11/05/2022 1240   LEUKOCYTESUR NEGATIVE 11/05/2022 1240   Sepsis Labs: Invalid input(s): "PROCALCITONIN", "LACTICIDVEN"   SIGNED:  Almon Hercules, MD  [facility] 11/09/2022, 12:53 PM

## 2022-11-09 NOTE — Progress Notes (Addendum)
Subjective: Feeling good this morning, feeling more than 50% better. No stomach issues or trouble sleeping, eager to get home. Felt good standing and walking to the commode, did use the wall for balance.   Objective: Current vital signs: BP 135/84 (BP Location: Left Arm)   Pulse 78   Temp 97.9 F (36.6 C) (Oral)   Resp 18   Ht 5\' 1"  (1.549 m)   Wt 93.4 kg   LMP 04/14/2022   SpO2 96%   BMI 38.92 kg/m  Vital signs in last 24 hours: Temp:  [97.9 F (36.6 C)-98 F (36.7 C)] 97.9 F (36.6 C) (10/06 1610) Pulse Rate:  [71-78] 78 (10/06 0808) Resp:  [18] 18 (10/06 0808) BP: (135-157)/(84-103) 135/84 (10/06 0808) SpO2:  [96 %-98 %] 96 % (10/06 0808)  Intake/Output from previous day: 10/05 0701 - 10/06 0700 In: 1018.2 [P.O.:840; I.V.:128.2; IV Piggyback:50] Out: 900 [Urine:900] Intake/Output this shift: No intake/output data recorded. Nutritional status:  Diet Order             Diet regular Room service appropriate? Yes; Fluid consistency: Thin  Diet effective now                   Neurologic Exam: GENERAL: Awake, alert, in no acute distress Psych: Affect appropriate for situation, patient is calm and cooperative with examination Head: Normocephalic and atraumatic, without obvious abnormality EENT: Normal conjunctivae, moist mucous membranes, no OP obstruction LUNGS: Normal respiratory effort. Non-labored breathing on room air Extremities: warm, well perfused, without obvious deformity   NEURO:  Mental Status: Awake, alert, and oriented to person, place, time, and situation. She is able to provide a clear and coherent history of present illness. Speech/Language: speech is clear and fluent, without dysarthria, but with a nasal quality. Comprehension intact.  No neglect is noted Cranial Nerves:  II: PERRL III, IV, VI: EOMI. Lid elevation symmetric and full without ptosis. No double vision. No nystagmus.  V: Sensation is intact to light touch but slightly diminished  on the left VII: Face is symmetric resting and smiling. Able to puff cheeks and raise eyebrows. There is mild diffuse weakness of her muscles of facial expression.  VIII: Hearing intact to voice IX, X: Phonation without hoarseness or hypophonia, but nasal quality is noted.  XI: Normal sternocleidomastoid and trapezius muscle strength XII: Tongue protrudes midline without fasciculations. No lingual dysarthria.  Motor: 4/5 strength proximally to bilateral upper extremities, 4/5 distally.  4+/5 strength to bilateral lower extremities, proximal and distal. No muscle tenderness.  Tone is normal. Bulk is normal.  Sensation: Intact to light touch bilaterally in all four extremities but slightly different on the left as per patient's baseline Coordination: FTN intact bilaterally.  No pronator drift.  DTRs: 2+ throughout.  Gait: Deferred  Lab Results: Results for orders placed or performed during the hospital encounter of 11/04/22 (from the past 48 hour(s))  Comprehensive metabolic panel     Status: Abnormal   Collection Time: 11/08/22  5:17 AM  Result Value Ref Range   Sodium 140 135 - 145 mmol/L   Potassium 4.6 3.5 - 5.1 mmol/L   Chloride 118 (H) 98 - 111 mmol/L   CO2 16 (L) 22 - 32 mmol/L   Glucose, Bld 127 (H) 70 - 99 mg/dL    Comment: Glucose reference range applies only to samples taken after fasting for at least 8 hours.   BUN 21 (H) 6 - 20 mg/dL   Creatinine, Ser 9.60 (H) 0.44 - 1.00 mg/dL  Calcium 8.2 (L) 8.9 - 10.3 mg/dL   Total Protein 5.9 (L) 6.5 - 8.1 g/dL   Albumin 2.2 (L) 3.5 - 5.0 g/dL   AST 92 (H) 15 - 41 U/L   ALT 56 (H) 0 - 44 U/L   Alkaline Phosphatase 38 38 - 126 U/L   Total Bilirubin 0.3 0.3 - 1.2 mg/dL   GFR, Estimated >16 >10 mL/min    Comment: (NOTE) Calculated using the CKD-EPI Creatinine Equation (2021)    Anion gap 6 5 - 15    Comment: Performed at [facility], [address] [place], [place] [address]  CK     Status: Abnormal   Collection Time:  11/08/22  5:17 AM  Result Value Ref Range   Total CK 2,003 (H) 38 - 234 U/L    Comment: Performed at [facility], [address] [place], [place] [address]  CBC     Status: Abnormal   Collection Time: 11/08/22  5:17 AM  Result Value Ref Range   WBC 7.8 4.0 - 10.5 K/uL   RBC 3.54 (L) 3.87 - 5.11 MIL/uL   Hemoglobin 9.3 (L) 12.0 - 15.0 g/dL   HCT 19.1 (L) 47.8 - 29.5 %   MCV 84.7 80.0 - 100.0 fL   MCH 26.3 26.0 - 34.0 pg   MCHC 31.0 30.0 - 36.0 g/dL   RDW 62.1 30.8 - 65.7 %   Platelets 355 150 - 400 K/uL   nRBC 0.0 0.0 - 0.2 %    Comment: Performed at [facility], [address], [place], [place] [address]  Magnesium     Status: Abnormal   Collection Time: 11/08/22  5:17 AM  Result Value Ref Range   Magnesium 1.6 (L) 1.7 - 2.4 mg/dL    Comment: Performed at [facility], [address] [place], [place] [address]  Phosphorus     Status: None   Collection Time: 11/08/22  5:17 AM  Result Value Ref Range   Phosphorus 3.3 2.5 - 4.6 mg/dL    Comment: Performed at [facility], [address] [place], [place] [address]  Sedimentation rate     Status: Abnormal   Collection Time: 11/08/22  5:17 AM  Result Value Ref Range   Sed Rate 42 (H) 0 - 22 mm/hr    Comment: Performed at [facility], [address], [place], [place] [address]  C-reactive protein     Status: None   Collection Time: 11/08/22  5:17 AM  Result Value Ref Range   CRP 0.9 <1.0 mg/dL    Comment: Performed at [facility], [address] [place], [place] [address]  Protein / creatinine ratio, urine     Status: Abnormal   Collection Time: 11/08/22  9:44 AM  Result Value Ref Range   Creatinine, Urine 54 mg/dL   Total Protein, Urine 24 mg/dL    Comment: NO NORMAL RANGE ESTABLISHED FOR THIS TEST   Protein Creatinine Ratio 0.44 (H) 0.00 - 0.15 mg/mg[Cre]    Comment: Performed at [facility], [address], [place], [place] [address]  CBC     Status: Abnormal   Collection  Time: 11/09/22  7:25 AM  Result Value Ref Range   WBC 7.8 4.0 - 10.5 K/uL   RBC 3.99 3.87 - 5.11 MIL/uL   Hemoglobin 10.5 (L) 12.0 - 15.0 g/dL   HCT 47.4 (L) 25.9 - 56.3 %   MCV  85.5 80.0 - 100.0 fL   MCH 26.3 26.0 - 34.0 pg   MCHC 30.8 30.0 - 36.0 g/dL   RDW 16.1 09.6 - 04.5 %   Platelets 389 150 - 400 K/uL   nRBC 0.0 0.0 - 0.2 %    Comment: Performed at [facility], [address], [place], [place] [address]    Recent Results (from the past 240 hour(s))  Urine Culture     Status: Abnormal   Collection Time: 11/04/22  6:11 PM   Specimen: Urine, Clean Catch  Result Value Ref Range Status   Specimen Description   Final    URINE, CLEAN CATCH Performed at [facility], [address], [place], [place] [address]    Special Requests   Final    NONE Performed at [facility], [address], [place], [place] [address]    Culture MULTIPLE SPECIES PRESENT, SUGGEST RECOLLECTION (A)  Final   Report Status 11/06/2022 FINAL  Final  SARS Coronavirus 2 by RT PCR (hospital order, performed in [facility] hospital lab) *cepheid single result test* Anterior Nasal Swab     Status: None   Collection Time: 11/04/22 10:50 PM   Specimen: Anterior Nasal Swab  Result Value Ref Range Status   SARS Coronavirus 2 by RT PCR NEGATIVE NEGATIVE Final    Comment: (NOTE) SARS-CoV-2 target nucleic acids are NOT DETECTED.  The SARS-CoV-2 RNA is generally detectable in upper and lower respiratory specimens during the acute phase of infection. The lowest concentration of SARS-CoV-2 viral copies this assay can detect is 250 copies / mL. A negative result does not preclude SARS-CoV-2 infection and should not be used as the sole basis for treatment or other patient management decisions.  A negative result may occur with improper specimen collection / handling, submission of specimen other than nasopharyngeal swab, presence of viral mutation(s) within the areas  targeted by this assay, and inadequate number of viral copies (<250 copies / mL). A negative result must be combined with clinical observations, patient history, and epidemiological information.  Fact Sheet for Patients:   RoadLapTop.co.za  Fact Sheet for Healthcare Providers: http://kim-miller.com/  This test is not yet approved or  cleared by the [place] FDA and has been authorized for detection and/or diagnosis of SARS-CoV-2 by FDA under an Emergency Use Authorization (EUA).  This EUA will remain in effect (meaning this test can be used) for the duration of the COVID-19 declaration under Section 564(b)(1) of the Act, 21 U.S.C. section 360bbb-3(b)(1), unless the authorization is terminated or revoked sooner.  Performed at [facility], [address], [place], [place] [address]     Lipid Panel No results for input(s): "CHOL", "TRIG", "HDL", "CHOLHDL", "VLDL", "LDLCALC" in the last 72 hours.  Studies/Results: No results found.  Medications: Scheduled:  azaTHIOprine  150 mg Oral Daily   feeding supplement  237 mL Oral BID BM   gabapentin  300 mg Oral TID   hydroxychloroquine  200 mg Oral BID   metoprolol succinate  25 mg Oral Daily   multivitamin with minerals  1 tablet Oral Daily   pantoprazole  40 mg Oral Daily   predniSONE  50 mg Oral Q breakfast   Continuous:   Assessment: 37 year old female with a history of lupus, multiple sclerosis and pericarditis who presents with an approximately 1 month history of lower extremity weakness as well as acute kidney injury in the setting of severely elevated CK level suggestive of muscle  breakdown/inflammation.  Weakness is symmetrical, and she verbalizes some weakness also in upper extremities, worse distally than proximally.  - On exam she continues to have proximal and distal weakness of the BUE and BLE, in addition to mild weakness of facial musculature, mild  neck weakness in flexion and nasal speech. No muscle tenderness is present and reflexes are preserved. She was able to ambulate to the bathroom this morning. She states that subjectively she feels significantly better. On day 3 of prednisone at 50 mg po every day.  - Labs: - CK was noted to be quite elevated on admission at 23,975, peaking at 49,442 on 10/2, then steadily has improved. Most recent CK level is 836 (10/6) - LFTs were elevated on admission with AST 391 and ALT 105. Have trended downward since then, now with AST56 and ALT 44 - ESR markedly elevated at 105, consistent with an inflammatory process. CRP also elevated at 7.3 - Na normal. Ca mildly low, but in the context of low albumin.  - Elevated BUN and Cr -> improved with GFR >60 - Mg normal. K slightly low on admission but now normalized - ANA continues to be positive, as it has been with prior lab draws documented in Epic - Imaging: - MRI pelvis without contrast: Severe, nearly symmetric T2 hyperintensity throughout the visualized musculature of the pelvis and thighs, consistent with multifocal myositis. No focal fluid collection or focal muscular atrophy identified. Persistent nonspecific bilateral external iliac and inguinal adenopathy, progressive from prior CT. Cannot exclude lymphoproliferative process.  - MRI of bilateral thighs without contrast: Severe, nearly symmetric T2 hyperintensity throughout the musculature of the lower pelvis and proximal thighs bilaterally consistent with myositis. There is asymmetric involvement of the left semitendinosus and biceps muscles. No focal muscular atrophy or fluid collection identified. No acute osseous findings.   - Right rectus femoris muscle was biopsied Thursday and sent to the lab.  - Overall impression: MRI confirms that this is a myositis. 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 No rash to suggest dermatomyositis.     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 First dose was on Friday 10/4.  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 Clin Pract. 2011) reported that all of their patients with overlap syndrome of systemic lupus erythematosus and myositis had been treated with oral prednisone for 6 weeks, after which the dose was gradually tapered. Over a mean follow-up of 6 years, all patients had full remission of myositis. In the event of either poor response or adverse effects of corticosteroids, a second line of treatment with immunosuppressant may be required.  -Bitencourt N, Solow EB, Wright T, Bermas BL. Inflammatory myositis in systemic lupus erythematosus. Lupus. 2020 Jun;29(7):776-781. doi: 10.1177/0961203320918021. Epub 2020 Apr 11. PMID: 57846962. "In our SLE patient population, 6.3% presented with concurrent inflammatory myositis. Dermatomyositis-specific rashes, clinical features of systemic sclerosis, arthralgias and arthritis, and cytopenias were common coexisting clinical manifestations. A high frequency of RNP, MSA, and MAA were found. People of black race and with childhood-onset  disease had a higher prevalence of myositis. Our findings suggest that SLE patients of black race, with childhood-onset SLE, and who possess MSA or MAA should be routinely screened for myositis." -Continue with hydration in the setting of elevated CK -Trend CK, which continues to significantly improve -Monitor and correct electrolytes as indicated by lab values -Will need close  outpatient Neurology follow up for follow up of her muscle biopsy results (still in pathology today, biopsied 10/3), reassessment of her strength, CK level, LFTs and for monitoring of her response to continued steroid treatment, followed by management/monitoring of her prednisone taper (taper should start November 15).       LOS: 5 days   Patient seen and examined by NP/APP  Elmer Picker, DNP, FNP-BC [facility] Pager: [phone]   @Electronically  signed: Dr. Caryl Pina 11/09/2022  8:24 AM

## 2022-11-09 NOTE — Plan of Care (Signed)
  Problem: Education: °Goal: Knowledge of General Education information will improve °Description: Including pain rating scale, medication(s)/side effects and non-pharmacologic comfort measures °Outcome: Progressing °  °Problem: Health Behavior/Discharge Planning: °Goal: Ability to manage health-related needs will improve °Outcome: Progressing °  °Problem: Clinical Measurements: °Goal: Ability to maintain clinical measurements within normal limits will improve °Outcome: Progressing °Goal: Will remain free from infection °Outcome: Progressing °Goal: Diagnostic test results will improve °Outcome: Progressing °Goal: Respiratory complications will improve °Outcome: Progressing °Goal: Cardiovascular complication will be avoided °Outcome: Progressing °  °Problem: Activity: °Goal: Risk for activity intolerance will decrease °Outcome: Progressing °  °Problem: Nutrition: °Goal: Adequate nutrition will be maintained °Outcome: Progressing °  °Problem: Coping: °Goal: Level of anxiety will decrease °Outcome: Progressing °  °Problem: Elimination: °Goal: Will not experience complications related to bowel motility °Outcome: Progressing °Goal: Will not experience complications related to urinary retention °Outcome: Progressing °  °Problem: Pain Managment: °Goal: General experience of comfort will improve °Outcome: Progressing °  °

## 2022-11-10 ENCOUNTER — Encounter: Payer: Self-pay | Admitting: Nurse Practitioner

## 2022-11-11 ENCOUNTER — Telehealth: Payer: Self-pay

## 2022-11-11 NOTE — Transitions of Care (Post Inpatient/ED Visit) (Signed)
11/11/2022  Name: Emily Hicks MRN: [id number] DOB: 30-Sep-1985  Today's TOC FU Call Status: Today's TOC FU Call Status:: Unsuccessful Call (1st Attempt) Unsuccessful Call (1st Attempt) Date: 11/11/22  Attempted to reach the patient regarding the most recent Inpatient/ED visit.  Follow Up Plan: Additional outreach attempts will be made to reach the patient to complete the Transitions of Care (Post Inpatient/ED visit) call.   Susa Loffler , BSN, RN Care Management Coordinator [place]   [facility] [email or web address] Direct Dial: [phone]

## 2022-11-12 ENCOUNTER — Telehealth: Payer: Self-pay

## 2022-11-12 NOTE — Transitions of Care (Post Inpatient/ED Visit) (Signed)
11/12/2022  Name: Emily Hicks MRN: [id number] DOB: Aug 06, 1985  Today's TOC FU Call Status: Today's TOC FU Call Status:: Unsuccessful Call (2nd Attempt) Unsuccessful Call (2nd Attempt) Date: 11/12/22  Attempted to reach the patient regarding the most recent Inpatient/ED visit.  Follow Up Plan: Additional outreach attempts will be made to reach the patient to complete the Transitions of Care (Post Inpatient/ED visit) call.   Susa Loffler , BSN, RN Care Management Coordinator [place]   [facility] [email or web address] Direct Dial: [phone]

## 2022-11-13 ENCOUNTER — Telehealth: Payer: Self-pay

## 2022-11-13 NOTE — Transitions of Care (Post Inpatient/ED Visit) (Signed)
11/13/2022  Name: Emily Hicks MRN: [id number] DOB: [birth year]  Today's TOC FU Call Status: Today's TOC FU Call Status:: Unsuccessful Call (3rd Attempt) Unsuccessful Call (3rd Attempt) Date: 11/13/22  Attempted to reach the patient regarding the most recent Inpatient/ED visit.  Follow Up Plan: No further outreach attempts will be made at this time. We have been unable to contact the patient.   Susa Loffler , BSN, RN Care Management Coordinator [place]   [facility] [email or web address] Direct Dial: [phone]

## 2022-11-13 NOTE — Telephone Encounter (Signed)
This encounter was created in error - please disregard.

## 2022-11-18 ENCOUNTER — Encounter (HOSPITAL_COMMUNITY): Payer: Self-pay

## 2022-11-18 LAB — SURGICAL PATHOLOGY

## 2022-11-19 NOTE — Plan of Care (Signed)
Muscle biopsy results came back positive for mild inflammation. Report conclusions state "very mild inflammation with no clear evidence of necrosis, except for 1 regenerating fiber. These findings are seen in connective tissue disorders and clinical correlation is suggested".   I have forwarded the results to Dr. Epimenio Foot, her Neuroimmunologist. Her Rheumatologist, Dr. Casimer Lanius of Rheumatology here in [place], unfortunately is not available on Epic email and I was unable to forward this to him. I have also called the patient today and informed her of the results. All questions were answered.   She continues on her prednisone at 50 mg po every day. She states that her muscle strength is slowly but gradually improving.   Electronically signed: Dr. Caryl Pina

## 2022-12-11 ENCOUNTER — Ambulatory Visit: Payer: 59 | Admitting: Neurology

## 2022-12-11 ENCOUNTER — Encounter: Payer: Self-pay | Admitting: Neurology

## 2022-12-11 VITALS — BP 137/100 | HR 53 | Ht 61.0 in | Wt 195.5 lb

## 2022-12-11 DIAGNOSIS — M6282 Rhabdomyolysis: Secondary | ICD-10-CM | POA: Diagnosis not present

## 2022-12-11 DIAGNOSIS — M329 Systemic lupus erythematosus, unspecified: Secondary | ICD-10-CM

## 2022-12-11 DIAGNOSIS — G35 Multiple sclerosis: Secondary | ICD-10-CM

## 2022-12-11 DIAGNOSIS — N319 Neuromuscular dysfunction of bladder, unspecified: Secondary | ICD-10-CM

## 2022-12-11 DIAGNOSIS — Z79899 Other long term (current) drug therapy: Secondary | ICD-10-CM

## 2022-12-11 DIAGNOSIS — R269 Unspecified abnormalities of gait and mobility: Secondary | ICD-10-CM

## 2022-12-11 DIAGNOSIS — G43009 Migraine without aura, not intractable, without status migrainosus: Secondary | ICD-10-CM

## 2022-12-11 MED ORDER — BACLOFEN 10 MG PO TABS: 10.0000 mg | ORAL_TABLET | Freq: Three times a day (TID) | ORAL | 5 refills | Status: DC

## 2022-12-11 NOTE — Progress Notes (Signed)
[facility]  PATIENT: LAURIANA Hicks DOB: 03-14-1985  REFERRING DOCTOR OR PCP:  Dr. Constance Goltz (PCP) SOURCE: patient, lab reports, MRI reports, multiple MRI images personally reviewed.  _________________________________   HISTORICAL  CHIEF COMPLAINT:  Chief Complaint  Patient presents with   Room 10    Pt is here Alone. Pt states that in September she wasn't feeling well and had to go to the hospital for 1 week and her legs were weak. Pt states that her kidney function was high when she was at the hospital. Pt states that from her waist down her lower body has been weak. Pt states that she hasn't been able to control her bladder. Pt states that she is concern about her gait issues. Pt states that she had stood up one morning and bowls had released and she had an accident on the floor.    HISTORY OF PRESENT ILLNESS:  She is a 37 year old woman who was diagnosed with MS in early 2020 but has had symptoms since 2010.   She also has SLE.  Update 12/11/2022: She is having more pain and muscle tightness I her lower back and legs.   Muscles are stiff.   She is walking better than earlier this year but sometimes will use the Rollator.    She was feeling weaker and had thigh pain.  She went to the hospital 11/06/2022.   She was felt to have very elevated CK (high of [id number]) and had a muscle biopsy (just showed general inflammation).  She was placed on prednisone 50 mg po daily.  Dr Deanne Coffer felt the myositis was due to her SLE  For SLE currently on azathioprine 150 mg daily and HCQ 200 mg po bid.   Not currently on a biologic but was on  .  Saphnelo , an IFN-1 receptor antagonist IV.      Around May 2024, she had a flare up of numbness, bladder issues and leg strength.  Gait is worse.   Her bladder issues developed with this flare-up.   This is mostly urgency though she has considerable hesitancy, and double voids/incomplete empying.   I saw her in June 2024.  Back in July 2024, we had  rechecked MRI brain and spiune and she had no new MS lesions.     Neulologically, she is stable.  Gait and balance are fine. She can go up/down stairs without using the bannister.   She has left arm tingling which is not troublesome..    Limb strength is fine.     Bladder function is fine.   Vision is fine.   She sees ophthalmology regularly     She has some fatigue that fluctuates.  She is often very tired after work.   She sleeps well most nights.  Mood is fine.  Cognition is fine.   She has some headaches once a month and they respond to OTC analgesics.   This is improved.    She sees Dr. Deanne Coffer [facility]) for her SLE.    MS Clinical course:   Around 2010, she had numbness/tingling in her left thigh and cramps in her toes.   While pregnant a little time later, she improved.   After her sone was born, the left leg leg symptoms returned.   She saw Dr. Terrace Arabia and an MRI of the brain showed a few scattered foci but was mostly nonspecific.   She had more intense numbness in the left leg but also numbness in  the left face and a repeat 2016 brain MRI showed a few more lesions.    Numbness symptoms did not improve but early 2020 she had more intense numbness and an MRI of the brain and cervical spine was performed.   She had 3-4 lesions in her cervical spine and the brian showed no definite less lesions.   An LP ws then performed and showed > 5 oligoclonal bands.   MS was diagnosed.    She started Aubagio and took only one week before having a hospitalization for fever, hypernatremia, joint inflammation, thrush) and it was stopped.    First seen by Dr. Epimenio Foot 09/08/2018.     MR cervical spine 08/05/2022 showed  This MRI of the cervical spine with and without contrast shows the following: T2 hyperintense foci noted in the brainstem and adjacent to C1-C2, C2, C4-C5 and C6-C7 as detailed above.  None of the foci enhanced.  Compared to the MRI from 05/16/2020, there do not appear to be any new  lesions. No disc degenerative changes.  No spinal stenosis or nerve root compression. Normal enhancement pattern.   MRI thoracic spine 08/05/2022 showed normal spinal cord  MRI brain 08/05/2022 showed Small number of T2/FLAIR hypertense foci in the cerebral hemispheres and 1 focus in the right upper pons in a pattern consistent with chronic demyelinating plaque associated with multiple sclerosis. None of the foci appear to be acute. Compared to previous MRIs, there were no new lesions.    MRI brain 05/16/2020 was unchanged compared to 2021  MRI cervical spine 05/16/2020 showed  no definite new lesions (one possible new lesion had been seen on a prior MRI). "There is a subtle T2 hyperintense focus posterolaterally to the right adjacent to C2. There is a focus toward the left adjacent to C4-C5 also noted on the previous MRIs. These were present, and actually more apparent, on the MRI from 05/24/2019. There is a subtle focus towards the right adjacent to C6-C7 seen more prominently on previous MRIs. There is a small focus towards the left C5-C6 seen on the sagittal images today but not noted on the4/20/2021 MRI. However, the focus does appear to be subtle on the 03/24/2018 MRI.. "    MRI brain 05/24/19 shows foci in the hemispheres and in the right cerebral peduncle consistent with MS.  No changes compared to 07/27/2018.  Normal enhancement pattern. MRI cervical spine 05/24/19 shows T2 hyperintense foci to the right adjacent to C2, to the left adjacent to C4-C5, towards the left adjacent to C5 and towards the right adjacent to C6-C7.  No change compared to the 2020 MRI. MRI brain 07/27/18: Formal report was Normal - does show a few small T2/Flair lesions, 2 periventricular and the rest deep white matter MRI cervical spine 03/24/18 shows 3-4 lesions (C4 left, C5 left and C7 right also C2 on sagittal only) c/w demyelinating plaque. MRI brain 03/25/18:  Some scattered T2/Flair foci in hemispheres 1-2  perventricular MRI lumbar 08/02/18:  Normal  11/15/2014: By the report:   6 periventricular and subcortical foci - 1 new focus in left posterior temporal white matter and 2 punctate left frontal lobe lesions compared to 2011.   Other lesions stable.  I also see a right cerebral peduncle lesion that may explain her symptoms   MS and other Lab Studies:  CSF showed > 5 OCB and increased IgG index (1.57) 04/16/2018.   Positive ANA with increased dsDNA, ENARNP, ENA SM, SSA.   ESR = 27, Vit D  low (15-17)  Other medical problems: She has SLE and is on Plaquenil (Dr. Deanne Coffer [phone])  She was hospitalized for SLE, hypernatremia, elevated LFTs and thrush 07/27/18,   Of note LFTs were normal 09/08/2018.   She did take Aubagio x 1 week before she was hospitalized.     REVIEW OF SYSTEMS: Constitutional: No fevers, chills, sweats, or change in appetite Eyes: No visual changes, double vision, eye pain Ear, nose and throat: No hearing loss, ear pain, nasal congestion, sore throat Cardiovascular: No chest pain, palpitations Respiratory:  No shortness of breath at rest or with exertion.   No wheezes GastrointestinaI: No nausea, vomiting, diarrhea, abdominal pain, fecal incontinence Genitourinary:  No dysuria, urinary retention or frequency.  No nocturia. Musculoskeletal:  No neck pain, back pain Integumentary: No rash, pruritus, skin lesions Neurological: as above Psychiatric: No depression at this time.  No anxiety Endocrine: No palpitations, diaphoresis, change in appetite, change in weigh or increased thirst Hematologic/Lymphatic:  No anemia, purpura, petechiae. Allergic/Immunologic: No itchy/runny eyes, nasal congestion, recent allergic reactions, rashes  ALLERGIES: No Known Allergies  HOME MEDICATIONS:  Current Outpatient Medications:    azathioprine (IMURAN) 100 MG tablet, Take 150 mg by mouth daily., Disp: , Rfl:    clobetasol ointment (TEMOVATE) 0.05 %, Apply 1 application  topically 2 (two)  times daily as needed (as directed)., Disp: , Rfl:    gabapentin (NEURONTIN) 300 MG capsule, Take 1 capsule (300 mg total) by mouth 3 (three) times daily., Disp: 90 capsule, Rfl: 11   hydroxychloroquine (PLAQUENIL) 200 MG tablet, Take 200 mg by mouth 2 (two) times daily., Disp: , Rfl:    ketoconazole (NIZORAL) 2 % shampoo, Apply 1 Application topically 2 (two) times a week., Disp: , Rfl:    predniSONE (DELTASONE) 50 MG tablet, Take 1 tablet (50 mg total) by mouth daily with breakfast., Disp: 42 tablet, Rfl: 0   sulfamethoxazole-trimethoprim (BACTRIM DS) 800-160 MG tablet, Take 1 tablet by mouth daily., Disp: 90 tablet, Rfl: 0   TYLENOL 325 MG CAPS, Take 325-650 mg by mouth every 6 (six) hours as needed (for headaches or pain)., Disp: , Rfl:    metoprolol succinate (TOPROL XL) 25 MG 24 hr tablet, Take 1 tablet (25 mg total) by mouth at bedtime. (Patient not taking: Reported on 11/04/2022), Disp: 90 tablet, Rfl: 3   pantoprazole (PROTONIX) 40 MG tablet, Take 1 tablet (40 mg total) by mouth daily. (Patient not taking: Reported on 12/11/2022), Disp: 90 tablet, Rfl: 0   Vitamin D, Ergocalciferol, (DRISDOL) 1.25 MG (50000 UNIT) CAPS capsule, Take 1 capsule (50,000 Units total) by mouth every 7 (seven) days. (Patient not taking: Reported on 11/04/2022), Disp: 12 capsule, Rfl: 1  Current Facility-Administered Medications:    medroxyPROGESTERone (DEPO-PROVERA) injection 150 mg, 150 mg, Intramuscular, Q90 days, McDiarmid, Leighton Roach, MD, 150 mg at 03/18/21 1435  PAST MEDICAL HISTORY: Past Medical History:  Diagnosis Date   Cervical intraepithelial neoplasia III    Gallstones    GERD (gastroesophageal reflux disease)    only prn OTC occasionally   History of blood transfusion 2020   Lupus dx 2020   Multiple sclerosis (HCC) dx 2020   Numbness and tingling of left leg     PAST SURGICAL HISTORY: Past Surgical History:  Procedure Laterality Date   CERVICAL CONIZATION W/BX N/A 01/16/2020   Procedure:  CERVICAL CONE BIOPSY, ENDOCERVICAL CURETTAGE;  Surgeon: Theresia Majors, MD;  Location: [facility];  Service: Gynecology;  Laterality: N/A;  request 7:30am OR start in  [place] [facility] block requests 45 minutes   CHOLECYSTECTOMY N/A 12/28/2013   Procedure: LAPAROSCOPIC CHOLECYSTECTOMY;  Surgeon: Claud Kelp, MD;  Location: [facility];  Service: General;  Laterality: N/A;   MUSCLE BIOPSY Right 11/06/2022   Procedure: RIGHT THIGH MUSCLE BIOPSY;  Surgeon: Andria Meuse, MD;  Location: [facility] OR;  Service: General;  Laterality: Right;    FAMILY HISTORY: Family History  Problem Relation Age of Onset   Hypertension Father    Hypertension Mother    Hypertension Sister    Diabetes Maternal Grandmother    Diabetes Maternal Grandfather     SOCIAL HISTORY:  Social History   Socioeconomic History   Marital status: Married    Spouse name: Not on file   Number of children: 1   Years of education: 16   Highest education level: Bachelor's degree (e.g., BA, AB, BS)  Occupational History   Occupation: Accounting  Tobacco Use   Smoking status: Never   Smokeless tobacco: Never  Vaping Use   Vaping status: Never Used  Substance and Sexual Activity   Alcohol use: No   Drug use: No   Sexual activity: Yes    Birth control/protection: None    Comment: 1st intercourse 37 yo-Fewer than 5 partners  Other Topics Concern   Not on file  Social History Narrative   Lives at home with husband and son.   Right-handed   Drinks some Tea   Social Determinants of Health   Financial Resource Strain: Not on file  Food Insecurity: No Food Insecurity (11/05/2022)   Hunger Vital Sign    Worried About Running Out of Food in the Last Year: Never true    Ran Out of Food in the Last Year: Never true  Transportation Needs: No Transportation Needs (11/05/2022)   PRAPARE - Administrator, Civil Service (Medical): No    Lack of Transportation (Non-Medical): No  Physical Activity: Not  on file  Stress: Not on file  Social Connections: Not on file  Intimate Partner Violence: Not At Risk (11/05/2022)   Humiliation, Afraid, Rape, and Kick questionnaire    Fear of Current or Ex-Partner: No    Emotionally Abused: No    Physically Abused: No    Sexually Abused: No     PHYSICAL EXAM  Vitals:   12/11/22 0830  BP: (!) 137/100  Pulse: (!) 53  Weight: 195 lb 8 oz (88.7 kg)  Height: 5\' 1"  (1.549 m)     Body mass index is 36.94 kg/m.   General: The patient is well-developed and well-nourished and in no acute distress   Skin: Extremities are without rash or  edema.  Musculoskeletal: She has tenderness with palpation of the proximal leg and shoulder/chest muscles   Neurologic Exam  Mental status: The patient is alert and oriented x 3 at the time of the examination. The patient has apparent normal recent and remote memory, with an apparently normal attention span and concentration ability.   Speech is normal.  Cranial nerves: Extraocular movements are full.  Facial strength and sensation is normal..  Trapezius and sternocleidomastoid strength is normal. No dysarthria is noted.   No obvious hearing deficits are noted.  Motor:  Muscle bulk is normal.   Tone is normal. Strength is  4-/5 right leg, 4/5 left leg.  Sensory: Sensory testing shows reduced sensation to touch and vibration on right arm and leg.    Coordination: Cerebellar testing reveals good finger-nose-finger and now normal heel-to-shin bilaterally.  Gait and  station: Station is normal.  She needs walker. Was able to walk independently at the last visit.   Cannot tandem.  Romberg is borderline.  Reflexes: Deep tendon reflexes are symmetric and normal bilaterally.         DIAGNOSTIC DATA (LABS, IMAGING, TESTING) - I reviewed patient records, labs, notes, testing and imaging myself where available.  Lab Results  Component Value Date   WBC 7.8 11/09/2022   HGB 10.5 (L) 11/09/2022   HCT 34.1 (L)  11/09/2022   MCV 85.5 11/09/2022   PLT 389 11/09/2022      Component Value Date/Time   NA 142 11/09/2022 0725   NA 143 09/29/2019 1035   K 4.2 11/09/2022 0725   CL 118 (H) 11/09/2022 0725   CO2 18 (L) 11/09/2022 0725   GLUCOSE 99 11/09/2022 0725   BUN 21 (H) 11/09/2022 0725   BUN 7 09/29/2019 1035   CREATININE 1.38 (H) 11/09/2022 0725   CREATININE 0.71 10/29/2010 0934   CALCIUM 8.4 (L) 11/09/2022 0725   PROT 5.9 (L) 11/09/2022 0725   PROT 6.7 09/29/2019 1035   ALBUMIN 2.3 (L) 11/09/2022 0725   ALBUMIN 4.2 09/29/2019 1035   ALBUMIN 4.0 04/16/2018 1225   AST 56 (H) 11/09/2022 0725   ALT 44 11/09/2022 0725   ALKPHOS 45 11/09/2022 0725   BILITOT 0.3 11/09/2022 0725   BILITOT 0.6 09/29/2019 1035   GFRNONAA 51 (L) 11/09/2022 0725   GFRAA 127 09/29/2019 1035   Lab Results  Component Value Date   CHOL 126 08/15/2021   HDL 49 08/15/2021   LDLCALC 59 08/15/2021   TRIG 93 08/15/2021   CHOLHDL 2.6 08/15/2021   Lab Results  Component Value Date   HGBA1C 5.4 06/16/2022   Lab Results  Component Value Date   VITAMINB12 309 11/05/2022   Lab Results  Component Value Date   TSH 0.661 11/04/2022       ASSESSMENT AND PLAN  1. Multiple sclerosis (HCC)   2. SLE (systemic lupus erythematosus related syndrome) (HCC)   3. Migraine without aura and without status migrainosus, not intractable   4. Gait disturbance   5. Bladder dysfunction       1.   She appeared to have an MS exacerbation in May 2024 but MRI showed no new lesions.   She is doing better.  She will continue her SLE med's for now.   Azathioprine likely has mild benefit for MS.  We disussed the MS DMT's  If another exacerbation and we need to start one.  l coordinate with Rheumatologist - Dr. Deanne Coffer at [facility] if changes)  2.   Essential hospitalization with elevated CK, probably rhabdomyolysis she had a recent hospitalization with elevated creatinine kinase.  Additionally this course mild renal failure.   We will check some lab work to make sure that she continues to trend in the right direction.  She is currently on steroids and this might be able to be tapered soon 3.  Stay active and exercise as tolerated. 4.   Vit D supps - 5000 units daily 5.   She will return to see me in 6 months but sooner based on a treatment decision.  Dorotha Hirschi A. Epimenio Foot, MD, [facility] 12/11/2022, 9:00 AM Certified in Neurology, Clinical Neurophysiology, Sleep Medicine and Neuroimaging  [facility] [address] [place], [place] [address] [phone]

## 2022-12-11 NOTE — Patient Instructions (Signed)
Start baclofen for muscle spasticity.  We will check some labwork today.  At your next visit with Dr. Deanne Coffer, ask him if you are a candidate for leflunomide (very similar to teriflunomide which is known to help MS).

## 2022-12-12 LAB — COMPREHENSIVE METABOLIC PANEL
ALT: 7 [IU]/L (ref 0–32)
AST: 10 [IU]/L (ref 0–40)
Albumin: 3.5 g/dL — ABNORMAL LOW (ref 3.9–4.9)
Alkaline Phosphatase: 69 [IU]/L (ref 44–121)
BUN/Creatinine Ratio: 14 (ref 9–23)
BUN: 12 mg/dL (ref 6–20)
Bilirubin Total: 0.3 mg/dL (ref 0.0–1.2)
CO2: 23 mmol/L (ref 20–29)
Calcium: 9.1 mg/dL (ref 8.7–10.2)
Chloride: 108 mmol/L — ABNORMAL HIGH (ref 96–106)
Creatinine, Ser: 0.86 mg/dL (ref 0.57–1.00)
Globulin, Total: 2.5 g/dL (ref 1.5–4.5)
Glucose: 89 mg/dL (ref 70–99)
Potassium: 3.7 mmol/L (ref 3.5–5.2)
Sodium: 146 mmol/L — ABNORMAL HIGH (ref 134–144)
Total Protein: 6 g/dL (ref 6.0–8.5)
eGFR: 90 mL/min/{1.73_m2} (ref >59)

## 2022-12-12 LAB — CBC WITH DIFFERENTIAL/PLATELET
Basophils Absolute: 0 10*3/uL (ref 0.0–0.2)
Basos: 1 %
EOS (ABSOLUTE): 0 10*3/uL (ref 0.0–0.4)
Eos: 0 %
Hematocrit: 33.2 % — ABNORMAL LOW (ref 34.0–46.6)
Hemoglobin: 10.2 g/dL — ABNORMAL LOW (ref 11.1–15.9)
Immature Grans (Abs): 0.1 10*3/uL (ref 0.0–0.1)
Immature Granulocytes: 2 %
Lymphocytes Absolute: 0.6 10*3/uL — ABNORMAL LOW (ref 0.7–3.1)
Lymphs: 14 %
MCH: 27.1 pg (ref 26.6–33.0)
MCHC: 30.7 g/dL — ABNORMAL LOW (ref 31.5–35.7)
MCV: 88 fL (ref 79–97)
Monocytes Absolute: 0.3 10*3/uL (ref 0.1–0.9)
Monocytes: 7 %
Neutrophils Absolute: 3.2 10*3/uL (ref 1.4–7.0)
Neutrophils: 76 %
Platelets: 241 10*3/uL (ref 150–450)
RBC: 3.76 x10E6/uL — ABNORMAL LOW (ref 3.77–5.28)
RDW: 15.8 % — ABNORMAL HIGH (ref 11.7–15.4)
WBC: 4.2 10*3/uL (ref 3.4–10.8)

## 2022-12-12 LAB — CK: Total CK: 21 U/L — ABNORMAL LOW (ref 32–182)

## 2022-12-17 ENCOUNTER — Encounter: Payer: Self-pay | Admitting: Neurology

## 2023-01-05 ENCOUNTER — Telehealth: Payer: Self-pay | Admitting: *Deleted

## 2023-01-05 MED ORDER — CYCLOBENZAPRINE HCL 5 MG PO TABS: 5.0000 mg | ORAL_TABLET | Freq: Three times a day (TID) | ORAL | 5 refills | Status: DC

## 2023-01-05 NOTE — Telephone Encounter (Signed)
Called pt per MD request. He reviewed recent [facility] notes from 12/15/22 that showed kidney issues. He wants her to stop baclofen. She is still having muscle spasms. She is agreeable to switch to cyclobenzaprine 5mg  po TID #90, 5 refills per MD recommendation. I called into [facility] per pt request. I asked her to call back if this is ineffective for spasms.

## 2023-01-19 ENCOUNTER — Encounter: Payer: Self-pay | Admitting: Neurology

## 2023-01-27 ENCOUNTER — Other Ambulatory Visit: Payer: Self-pay | Admitting: Neurology

## 2023-01-27 DIAGNOSIS — R269 Unspecified abnormalities of gait and mobility: Secondary | ICD-10-CM

## 2023-01-27 DIAGNOSIS — G35 Multiple sclerosis: Secondary | ICD-10-CM

## 2023-02-03 ENCOUNTER — Ambulatory Visit: Payer: 59 | Admitting: Neurology

## 2023-02-16 ENCOUNTER — Other Ambulatory Visit: Payer: Self-pay

## 2023-02-16 ENCOUNTER — Emergency Department (HOSPITAL_COMMUNITY)
Admission: EM | Admit: 2023-02-16 | Discharge: 2023-02-16 | Discharge status: Against Medical Advice | Payer: 59 | Attending: Emergency Medicine | Admitting: Emergency Medicine

## 2023-02-16 ENCOUNTER — Encounter (HOSPITAL_COMMUNITY): Payer: Self-pay

## 2023-02-16 DIAGNOSIS — R519 Headache, unspecified: Secondary | ICD-10-CM | POA: Diagnosis present

## 2023-02-16 DIAGNOSIS — Z20822 Contact with and (suspected) exposure to covid-19: Secondary | ICD-10-CM | POA: Insufficient documentation

## 2023-02-16 DIAGNOSIS — Z5321 Procedure and treatment not carried out due to patient leaving prior to being seen by health care provider: Secondary | ICD-10-CM | POA: Diagnosis not present

## 2023-02-16 DIAGNOSIS — R040 Epistaxis: Secondary | ICD-10-CM | POA: Insufficient documentation

## 2023-02-16 LAB — RESP PANEL BY RT-PCR (RSV, FLU A&B, COVID)  RVPGX2
Influenza A by PCR: NEGATIVE
Influenza B by PCR: NEGATIVE
Resp Syncytial Virus by PCR: NEGATIVE
SARS Coronavirus 2 by RT PCR: NEGATIVE

## 2023-02-16 NOTE — ED Triage Notes (Signed)
 C/o headache, runny nose, nausea, congestion, and nose bleeds x1 week.

## 2023-02-23 ENCOUNTER — Emergency Department (HOSPITAL_COMMUNITY): Payer: 59

## 2023-02-23 ENCOUNTER — Other Ambulatory Visit: Payer: Self-pay

## 2023-02-23 ENCOUNTER — Encounter (HOSPITAL_COMMUNITY): Payer: Self-pay | Admitting: Emergency Medicine

## 2023-02-23 ENCOUNTER — Inpatient Hospital Stay (HOSPITAL_COMMUNITY)
Admission: EM | Admit: 2023-02-23 | Discharge: 2023-03-03 | DRG: 041 | Disposition: A | Discharge status: Home / Self Care | Payer: 59 | Attending: Internal Medicine | Admitting: Internal Medicine

## 2023-02-23 DIAGNOSIS — Z9049 Acquired absence of other specified parts of digestive tract: Secondary | ICD-10-CM

## 2023-02-23 DIAGNOSIS — R2681 Unsteadiness on feet: Secondary | ICD-10-CM | POA: Diagnosis present

## 2023-02-23 DIAGNOSIS — G43109 Migraine with aura, not intractable, without status migrainosus: Secondary | ICD-10-CM | POA: Diagnosis present

## 2023-02-23 DIAGNOSIS — D649 Anemia, unspecified: Secondary | ICD-10-CM | POA: Diagnosis present

## 2023-02-23 DIAGNOSIS — R569 Unspecified convulsions: Secondary | ICD-10-CM | POA: Diagnosis not present

## 2023-02-23 DIAGNOSIS — D84821 Immunodeficiency due to drugs: Secondary | ICD-10-CM | POA: Diagnosis present

## 2023-02-23 DIAGNOSIS — N179 Acute kidney failure, unspecified: Secondary | ICD-10-CM | POA: Diagnosis not present

## 2023-02-23 DIAGNOSIS — Z8249 Family history of ischemic heart disease and other diseases of the circulatory system: Secondary | ICD-10-CM | POA: Diagnosis not present

## 2023-02-23 DIAGNOSIS — E66813 Obesity, class 3: Secondary | ICD-10-CM | POA: Diagnosis present

## 2023-02-23 DIAGNOSIS — G35A Relapsing-remitting multiple sclerosis: Secondary | ICD-10-CM | POA: Diagnosis present

## 2023-02-23 DIAGNOSIS — M329 Systemic lupus erythematosus, unspecified: Secondary | ICD-10-CM | POA: Diagnosis present

## 2023-02-23 DIAGNOSIS — E876 Hypokalemia: Secondary | ICD-10-CM | POA: Diagnosis present

## 2023-02-23 DIAGNOSIS — G629 Polyneuropathy, unspecified: Secondary | ICD-10-CM | POA: Diagnosis present

## 2023-02-23 DIAGNOSIS — G939 Disorder of brain, unspecified: Secondary | ICD-10-CM | POA: Diagnosis present

## 2023-02-23 DIAGNOSIS — Z86001 Personal history of in-situ neoplasm of cervix uteri: Secondary | ICD-10-CM

## 2023-02-23 DIAGNOSIS — E860 Dehydration: Secondary | ICD-10-CM | POA: Diagnosis present

## 2023-02-23 DIAGNOSIS — R591 Generalized enlarged lymph nodes: Secondary | ICD-10-CM | POA: Diagnosis present

## 2023-02-23 DIAGNOSIS — R5381 Other malaise: Secondary | ICD-10-CM | POA: Diagnosis present

## 2023-02-23 DIAGNOSIS — H53462 Homonymous bilateral field defects, left side: Secondary | ICD-10-CM | POA: Diagnosis present

## 2023-02-23 DIAGNOSIS — Z79624 Long term (current) use of inhibitors of nucleotide synthesis: Secondary | ICD-10-CM | POA: Diagnosis not present

## 2023-02-23 DIAGNOSIS — Z833 Family history of diabetes mellitus: Secondary | ICD-10-CM | POA: Diagnosis not present

## 2023-02-23 DIAGNOSIS — Z6836 Body mass index (BMI) 36.0-36.9, adult: Secondary | ICD-10-CM | POA: Diagnosis not present

## 2023-02-23 DIAGNOSIS — R918 Other nonspecific abnormal finding of lung field: Secondary | ICD-10-CM | POA: Diagnosis present

## 2023-02-23 DIAGNOSIS — I300 Acute nonspecific idiopathic pericarditis: Secondary | ICD-10-CM

## 2023-02-23 DIAGNOSIS — K219 Gastro-esophageal reflux disease without esophagitis: Secondary | ICD-10-CM | POA: Diagnosis present

## 2023-02-23 DIAGNOSIS — G35 Multiple sclerosis: Secondary | ICD-10-CM | POA: Diagnosis present

## 2023-02-23 DIAGNOSIS — R519 Headache, unspecified: Secondary | ICD-10-CM | POA: Diagnosis not present

## 2023-02-23 DIAGNOSIS — Z79899 Other long term (current) drug therapy: Secondary | ICD-10-CM

## 2023-02-23 DIAGNOSIS — I10 Essential (primary) hypertension: Secondary | ICD-10-CM | POA: Diagnosis present

## 2023-02-23 DIAGNOSIS — G35D Multiple sclerosis, unspecified: Secondary | ICD-10-CM | POA: Diagnosis present

## 2023-02-23 DIAGNOSIS — G43009 Migraine without aura, not intractable, without status migrainosus: Secondary | ICD-10-CM | POA: Diagnosis present

## 2023-02-23 LAB — CBC WITH DIFFERENTIAL/PLATELET
Abs Immature Granulocytes: 0.07 10*3/uL (ref 0.00–0.07)
Basophils Absolute: 0 10*3/uL (ref 0.0–0.1)
Basophils Relative: 0 %
Eosinophils Absolute: 0 10*3/uL (ref 0.0–0.5)
Eosinophils Relative: 0 %
HCT: 32.1 % — ABNORMAL LOW (ref 36.0–46.0)
Hemoglobin: 10 g/dL — ABNORMAL LOW (ref 12.0–15.0)
Immature Granulocytes: 1 %
Lymphocytes Relative: 9 %
Lymphs Abs: 0.5 10*3/uL — ABNORMAL LOW (ref 0.7–4.0)
MCH: 27 pg (ref 26.0–34.0)
MCHC: 31.2 g/dL (ref 30.0–36.0)
MCV: 86.5 fL (ref 80.0–100.0)
Monocytes Absolute: 0.2 10*3/uL (ref 0.1–1.0)
Monocytes Relative: 4 %
Neutro Abs: 4.3 10*3/uL (ref 1.7–7.7)
Neutrophils Relative %: 86 %
Platelets: 287 10*3/uL (ref 150–400)
RBC: 3.71 MIL/uL — ABNORMAL LOW (ref 3.87–5.11)
RDW: 13.2 % (ref 11.5–15.5)
WBC: 5.1 10*3/uL (ref 4.0–10.5)
nRBC: 0 % (ref 0.0–0.2)

## 2023-02-23 LAB — BASIC METABOLIC PANEL
Anion gap: 8 (ref 5–15)
BUN: 12 mg/dL (ref 6–20)
CO2: 22 mmol/L (ref 22–32)
Calcium: 8.4 mg/dL — ABNORMAL LOW (ref 8.9–10.3)
Chloride: 112 mmol/L — ABNORMAL HIGH (ref 98–111)
Creatinine, Ser: 0.92 mg/dL (ref 0.44–1.00)
GFR, Estimated: >60 mL/min (ref >60)
Glucose, Bld: 90 mg/dL (ref 70–99)
Potassium: 3.8 mmol/L (ref 3.5–5.1)
Sodium: 142 mmol/L (ref 135–145)

## 2023-02-23 LAB — HCG, QUANTITATIVE, PREGNANCY: hCG, Beta Chain, Quant, S: <1 m[IU]/mL (ref <5)

## 2023-02-23 MED ORDER — ONDANSETRON HCL 4 MG/2ML IJ SOLN
4.0000 mg | Freq: Four times a day (QID) | INTRAMUSCULAR | Status: DC | PRN
  Administered 2023-02-25 – 2023-02-26 (×2): 4 mg via INTRAVENOUS
  Filled 2023-02-23 (×2): qty 2

## 2023-02-23 MED ORDER — PROCHLORPERAZINE EDISYLATE 10 MG/2ML IJ SOLN
10.0000 mg | Freq: Once | INTRAMUSCULAR | Status: AC
  Administered 2023-02-23: 10 mg via INTRAVENOUS
  Filled 2023-02-23: qty 2

## 2023-02-23 MED ORDER — SODIUM CHLORIDE 0.9 % IV SOLN
250.0000 mL | INTRAVENOUS | Status: AC | PRN
Start: 2023-02-23 — End: 2023-02-24

## 2023-02-23 MED ORDER — KETOCONAZOLE 2 % EX SHAM
1.0000 | MEDICATED_SHAMPOO | CUTANEOUS | Status: DC
  Filled 2023-02-23: qty 120

## 2023-02-23 MED ORDER — LORAZEPAM 2 MG/ML IJ SOLN
1.0000 mg | INTRAMUSCULAR | Status: DC | PRN
Start: 2023-02-23 — End: 2023-02-26
  Administered 2023-02-23 – 2023-02-24 (×2): 1 mg via INTRAVENOUS
  Filled 2023-02-23 (×2): qty 1

## 2023-02-23 MED ORDER — LACTATED RINGERS IV SOLN: INTRAVENOUS | Status: AC

## 2023-02-23 MED ORDER — GABAPENTIN 300 MG PO CAPS
300.0000 mg | ORAL_CAPSULE | Freq: Three times a day (TID) | ORAL | Status: DC | PRN
  Filled 2023-02-23: qty 1

## 2023-02-23 MED ORDER — METOPROLOL SUCCINATE ER 25 MG PO TB24
25.0000 mg | ORAL_TABLET | Freq: Every day | ORAL | Status: DC
  Administered 2023-02-23 – 2023-02-27 (×5): 25 mg via ORAL
  Filled 2023-02-23 (×6): qty 1

## 2023-02-23 MED ORDER — PANTOPRAZOLE SODIUM 40 MG PO TBEC
40.0000 mg | DELAYED_RELEASE_TABLET | Freq: Every day | ORAL | Status: DC
  Administered 2023-02-24 – 2023-03-03 (×8): 40 mg via ORAL
  Filled 2023-02-23 (×8): qty 1

## 2023-02-23 MED ORDER — DIPHENHYDRAMINE HCL 50 MG/ML IJ SOLN
12.5000 mg | Freq: Once | INTRAMUSCULAR | Status: AC
  Administered 2023-02-23: 12.5 mg via INTRAVENOUS
  Filled 2023-02-23: qty 1

## 2023-02-23 MED ORDER — ZOLPIDEM TARTRATE 5 MG PO TABS
5.0000 mg | ORAL_TABLET | Freq: Every evening | ORAL | Status: DC | PRN
  Administered 2023-02-25: 5 mg via ORAL
  Filled 2023-02-23: qty 1

## 2023-02-23 MED ORDER — ACETAMINOPHEN 325 MG PO TABS
650.0000 mg | ORAL_TABLET | Freq: Four times a day (QID) | ORAL | Status: DC | PRN
  Administered 2023-02-24 – 2023-02-28 (×5): 650 mg via ORAL
  Filled 2023-02-23 (×5): qty 2

## 2023-02-23 MED ORDER — HYDROCODONE-ACETAMINOPHEN 5-325 MG PO TABS
1.0000 | ORAL_TABLET | ORAL | Status: DC | PRN
Start: 2023-02-23 — End: 2023-03-03
  Administered 2023-02-23 – 2023-02-25 (×2): 1 via ORAL
  Administered 2023-02-27: 2 via ORAL
  Filled 2023-02-23 (×2): qty 1
  Filled 2023-02-23: qty 2
  Filled 2023-02-23: qty 1

## 2023-02-23 MED ORDER — SODIUM CHLORIDE 0.9% FLUSH: 3.0000 mL | INTRAVENOUS | Status: DC | PRN

## 2023-02-23 MED ORDER — ACETAMINOPHEN 650 MG RE SUPP: 650.0000 mg | Freq: Four times a day (QID) | RECTAL | Status: DC | PRN

## 2023-02-23 MED ORDER — LACTATED RINGERS IV BOLUS
1000.0000 mL | Freq: Once | INTRAVENOUS | Status: AC
  Administered 2023-02-23: 1000 mL via INTRAVENOUS

## 2023-02-23 MED ORDER — POLYETHYLENE GLYCOL 3350 17 G PO PACK: 17.0000 g | PACK | Freq: Every day | ORAL | Status: DC | PRN

## 2023-02-23 MED ORDER — KETOROLAC TROMETHAMINE 15 MG/ML IJ SOLN
15.0000 mg | Freq: Once | INTRAMUSCULAR | Status: AC
  Administered 2023-02-23: 15 mg via INTRAVENOUS
  Filled 2023-02-23: qty 1

## 2023-02-23 MED ORDER — KETOROLAC TROMETHAMINE 30 MG/ML IJ SOLN: 30.0000 mg | Freq: Four times a day (QID) | INTRAMUSCULAR | Status: AC | PRN

## 2023-02-23 MED ORDER — VITAMIN D (ERGOCALCIFEROL) 1.25 MG (50000 UNIT) PO CAPS: 50000.0000 [IU] | ORAL_CAPSULE | ORAL | Status: DC

## 2023-02-23 MED ORDER — CLOBETASOL PROPIONATE 0.05 % EX OINT: 1.0000 | TOPICAL_OINTMENT | Freq: Every day | CUTANEOUS | Status: DC | PRN

## 2023-02-23 MED ORDER — HYDRALAZINE HCL 20 MG/ML IJ SOLN
10.0000 mg | Freq: Four times a day (QID) | INTRAMUSCULAR | Status: DC | PRN
Start: 2023-02-23 — End: 2023-02-28
  Administered 2023-02-23 – 2023-02-26 (×5): 10 mg via INTRAVENOUS
  Filled 2023-02-23 (×6): qty 1

## 2023-02-23 MED ORDER — GADOBUTROL 1 MMOL/ML IV SOLN
10.0000 mL | Freq: Once | INTRAVENOUS | Status: AC | PRN
  Administered 2023-02-23: 10 mL via INTRAVENOUS

## 2023-02-23 MED ORDER — ONDANSETRON HCL 4 MG PO TABS
4.0000 mg | ORAL_TABLET | Freq: Four times a day (QID) | ORAL | Status: DC | PRN
  Administered 2023-02-28: 4 mg via ORAL
  Filled 2023-02-23: qty 1

## 2023-02-23 MED ORDER — SODIUM CHLORIDE 0.9% FLUSH
3.0000 mL | Freq: Two times a day (BID) | INTRAVENOUS | Status: DC
  Administered 2023-02-23 – 2023-03-03 (×12): 3 mL via INTRAVENOUS

## 2023-02-23 MED ORDER — HYDROXYCHLOROQUINE SULFATE 200 MG PO TABS
200.0000 mg | ORAL_TABLET | Freq: Two times a day (BID) | ORAL | Status: DC
  Administered 2023-02-23 – 2023-03-03 (×16): 200 mg via ORAL
  Filled 2023-02-23 (×18): qty 1

## 2023-02-23 MED ORDER — ENOXAPARIN SODIUM 40 MG/0.4ML IJ SOSY
40.0000 mg | PREFILLED_SYRINGE | INTRAMUSCULAR | Status: DC
  Administered 2023-02-23 – 2023-02-25 (×3): 40 mg via SUBCUTANEOUS
  Filled 2023-02-23 (×3): qty 0.4

## 2023-02-23 NOTE — Plan of Care (Signed)
MRI brain personally reviewed -- radiology report:   Multifocal regions of predominantly cortical diffusion restriction with associated contrast enhancement, involving the bilateral insular regions, right occipital lobe, and left cerebellum. Findings are nonspecific, but with predominantly cortical involvement, CNS vasculitis is a differential consideration. Given patient's history, an additional differential consideration is demyelinating disease. Recommend further evaluation with a CTA head and neck angiogram.  Patient has history of both multiple sclerosis as well as lupus.  She is immunosuppressed with azathioprine and Plaquenil.  Therefore may not show typical stigmata of infection.  Given her very atypical MRI brain lesions I think lumbar puncture is critical to understand underlying process  Recommend lumbar puncture (low volume due to right parietal mass lesion which is not obstructing) -Protein, glucose, cell counts and tubes 1 and 4, meningitis/encephalitis panel no more than 1 to 2 cc of fluid per tube, any x-ray of fluid should be reserved by laboratory for potential additional testing based on preliminary results -Please obtain point-of-care glucose at time of successful completion of lumbar puncture to correlate with CSF findings -Please admit to [facility] for full neurological evaluation, please notify neurology on patient's arrival to [facility]  These are curbside recommendations based upon the information readily available in the chart on brief review as well as history and examination information provided to me by requesting provider and do not replace a full detailed consult

## 2023-02-23 NOTE — ED Notes (Signed)
Md at bedside discussing LP

## 2023-02-23 NOTE — ED Notes (Signed)
Md Messick at bedside for LP

## 2023-02-23 NOTE — ED Provider Notes (Signed)
Patient admitted by Nancy Fetter to medicine team.  She had MRI which showed lesion either consistent with MS versus vasculitis, infectious process neurology, Dr. Iver Nestle to look at imaging to see if she would like LP Physical Exam  BP (!) 161/104 (BP Location: Right Arm)   Pulse (!) 104   Temp 98.1 F (36.7 C) (Oral)   Resp 18   Ht 5\' 1"  (1.549 m)   Wt 88.5 kg   LMP 01/23/2023   SpO2 100%   BMI 36.84 kg/m   Physical Exam Vitals and nursing note reviewed.  Constitutional:      General: She is not in acute distress.    Appearance: She is well-developed. She is not ill-appearing.  HENT:     Head: Atraumatic.  Eyes:     Pupils: Pupils are equal, round, and reactive to light.  Cardiovascular:     Rate and Rhythm: Normal rate.  Pulmonary:     Effort: No respiratory distress.  Abdominal:     General: There is no distension.  Musculoskeletal:        General: Normal range of motion.     Cervical back: Normal range of motion.  Skin:    General: Skin is warm and dry.  Neurological:     General: No focal deficit present.     Mental Status: She is alert.  Psychiatric:        Mood and Affect: Mood normal.     Procedures  Procedures Labs Reviewed  CBC WITH DIFFERENTIAL/PLATELET - Abnormal; Notable for the following components:      Result Value   RBC 3.71 (*)    Hemoglobin 10.0 (*)    HCT 32.1 (*)    Lymphs Abs 0.5 (*)    All other components within normal limits  BASIC METABOLIC PANEL - Abnormal; Notable for the following components:   Chloride 112 (*)    Calcium 8.4 (*)    All other components within normal limits  CSF CULTURE W GRAM STAIN  HCG, QUANTITATIVE, PREGNANCY  CBC  COMPREHENSIVE METABOLIC PANEL  MAGNESIUM  PHOSPHORUS  CSF CELL COUNT WITH DIFFERENTIAL  PROTEIN AND GLUCOSE, CSF  VITAMIN D 25 HYDROXY (VIT D DEFICIENCY, FRACTURES)  TSH  HEMOGLOBIN A1C   MR Brain W and Wo Contrast Result Date: 02/23/2023 CLINICAL DATA:  Demyelinating disease EXAM: MRI HEAD  WITHOUT AND WITH CONTRAST TECHNIQUE: Multiplanar, multiecho pulse sequences of the brain and surrounding structures were obtained without and with intravenous contrast. CONTRAST:  10mL GADAVIST GADOBUTROL 1 MMOL/ML IV SOLN COMPARISON:  Same-day head, brain MR 08/05/2022 FINDINGS: Brain: There are multifocal regions of predominantly cortical diffusion restriction, involving the bilateral insular regions, right occipital lobe, in the left cerebellum. There is contrast enhancement associated with these regions of diffusion restriction, which is predominantly cortical. There is disproportionate degree of T2/FLAIR hyperintense signal abnormality surrounding the sites of diffusion restriction or contrast enhancement. Vascular: Normal flow voids. Skull and upper cervical spine: Normal marrow signal. Sinuses/Orbits: No middle ear or mastoid effusion. Paranasal sinuses are notable for mucosal thickening in the bilateral maxillary sinuses. Orbits are unremarkable. Other: None. IMPRESSION: Multifocal regions of predominantly cortical diffusion restriction with associated contrast enhancement, involving the bilateral insular regions, right occipital lobe, and left cerebellum. Findings are nonspecific, but with predominantly cortical involvement, CNS vasculitis is a differential consideration. Given patient's history, an additional differential consideration is demyelinating disease. Recommend further evaluation with a CTA head and neck angiogram. Electronically Signed   By: Elige Radon.D.  On: 02/23/2023 15:46   CT Head Wo Contrast Result Date: 02/23/2023 CLINICAL DATA:  Headache. Blurred vision, nausea, and vomiting. History of lupus and multiple sclerosis. EXAM: CT HEAD WITHOUT CONTRAST TECHNIQUE: Contiguous axial images were obtained from the base of the skull through the vertex without intravenous contrast. RADIATION DOSE REDUCTION: This exam was performed according to the departmental dose-optimization program  which includes automated exposure control, adjustment of the mA and/or kV according to patient size and/or use of iterative reconstruction technique. COMPARISON:  Head MRI 08/05/2022 FINDINGS: Brain: There is a 5 cm region of confluent heterogeneous hypodensity/edema in the posteromedial right parietal lobe. There is localized sulcal effacement and partial effacement of the right lateral ventricle. Smaller areas of abnormal hypodensity are present in the right frontal operculum and superior left temporal lobe posterior to the sylvian fissure. No acute intracranial hemorrhage, midline shift, hydrocephalus, or extra-axial fluid collection is evident. Vascular: No hyperdense vessel. Skull: No acute fracture or suspicious osseous lesion. Sinuses/Orbits: Mucous retention cyst in the left sphenoid sinus. Clear mastoid air cells. Unremarkable orbits. Other: None. IMPRESSION: Multifocal cerebral hypodensities, including a 5 cm edematous area in the right parietal lobe. These are indeterminate with considerations including subacute ischemia, neoplasm, an infectious/inflammatory process, and tumefactive demyelination. A brain MRI without and with contrast is recommended for further evaluation. Electronically Signed   By: Sebastian Ache M.D.   On: 02/23/2023 11:32    ED Course / MDM   Clinical Course as of 02/23/23 2308  Mon Feb 23, 2023  1200 CT head with some abnormalities.  Will further evaluate with MRI brain with and without contrast. [AA]  1507 Discussed with neurologist Dr. Iver Nestle.  She recommends admission.  Prior to starting steroids she is considering if the patient needs lumbar puncture.  She will let us know.  Will discuss with hospitalist for admission. [AA]    Clinical Course User Index [AA] Marita Kansas, PA-C   Medical Decision Making Amount and/or Complexity of Data Reviewed Labs: ordered. Radiology: ordered.  Risk Prescription drug management. Decision regarding hospitalization.   Patient  admitted by Nancy Fetter to medicine team.  She had MRI which showed lesion either consistent with MS versus vasculitis, infectious process neurology, Dr. Iver Nestle to look at imaging to see if she would like LP.  Dr. Iver Nestle recommending LP  Attending, Dr. Rodena Medin evaluated patient.  Attempted LP, see note.  Unfortunately difficult stick, will need IR to do LP under fluoroscopy in the morning.  Messaged neurology, Dr. Iver Nestle who is aware       Linwood Dibbles, PA-C 02/23/23 2309    Wynetta Fines, MD 02/23/23 [phone]

## 2023-02-23 NOTE — ED Notes (Signed)
Called [facility] again for transport

## 2023-02-23 NOTE — H&P (Addendum)
[facility] History and Physical  Emily Hicks NWG:[id number] DOB: 1985/11/11 DOA: 02/23/2023  Referring physician: ED  PCP: Emily Hicks, No Pcp Per   Emily Hicks is coming from: Home  Chief Complaint: Headache nausea vomiting  HPI:    Emily Hicks is a 38 years old female with past medical history of multiple sclerosis, lupus, GERD presented to hospital with complaints of severe headache more for 1 day but ongoing for the last couple of weeks.  Emily Hicks also complained of  some nausea and vomiting for the same duration.  She has been taking Excedrin without much relief and has had history of similar headaches in the past.   Emily Hicks stated that she does have different symptoms from her lupus flare and was not sure about it.  She also had some confusion last week.   Emily Hicks denies any urinary urgency, frequency or dysuria but has been having difficulty controlling it recently and has been using depends nowadays.  Normally able to ambulate at home and lives by herself but has been having some unsteady gait recently with off-balance.  Complains of weakness of her extremities which is nonfocal.  Denies any blurry vision or diplopia.  Emily Hicks stated that she was recently admitted to hospital in October for rhabdomyolysis since then she has been having more fatigue than usual.  Emily Hicks normally is constipated and had large bowel movement last 3 to 4 days back.  Denies any fever, chills or rigor.  Denies any syncope.  In the ED, Emily Hicks was noted to have elevated blood pressure with initial blood pressure of 175/119.  Labs were remarkable for WBC of 5.1 with hemoglobin of 10.0.  BMP within normal limits.  Urine pregnancy test was negative.  CT head scan done in the ED showed multiple cerebral hypodensities including 5 cm edematous area in the right parietal lobe.  Emily Hicks received Ringer lactate bolus x 1, Toradol 15 mg, Compazine and Benadryl in the ED.  Neurology was consulted from the ED who recommended  admission to the hospital at [facility] and MRI of the brain for further evaluation.   Assessment and Plan Principal Problem:   Multiple sclerosis (HCC) Active Problems:   SLE (systemic lupus erythematosus related syndrome) (HCC)   Normocytic anemia   Common migraine   Essential hypertension   Headache, nausea vomiting.  History of multiple sclerosis with possible flare.   CT scan of the head with hypodensities.  Unsure whether this represents multiple sclerosis flareup.  Neurology Dr. Iver Nestle was consulted from the ED who recommends admission at [facility] for further evaluation.  MRI of the brain has been ordered.  Might need lumbar puncture and MRI to be done prior to this.  Add Toradol for headache for now including Vicodin.  Check vitamin D levels.  On vitamin D at home will hold for now.  History of systemic lupus erythematosus. On Plaquenil.  Will continue.  Continue steroid cream for rash.  Mostly has some joint pain issues.  History of GERD Continue pantoprazole.  Peripheral neuropathy.  Continue Neurontin.  Hypertension.  Accelerated on presentation likely secondary to headache.  On metoprolol at home.  Will continue.  Add as needed hydralazine as well.  Grade 2 obesity. Body mass index is 36.84 kg/m.  Might benefit from weight loss as outpatient.   DVT Prophylaxis: Lovenox subcu  Review of Systems:  All systems were reviewed and were negative unless otherwise mentioned in the HPI   Past Medical History:  Diagnosis Date   Cervical intraepithelial  neoplasia III    Gallstones    GERD (gastroesophageal reflux disease)    only prn OTC occasionally   History of blood transfusion 2020   Lupus dx 2020   Multiple sclerosis (HCC) dx 2020   Numbness and tingling of left leg    Past Surgical History:  Procedure Laterality Date   CERVICAL CONIZATION W/BX N/A 01/16/2020   Procedure: CERVICAL CONE BIOPSY, ENDOCERVICAL CURETTAGE;  Surgeon: Theresia Majors, MD;   Location: [facility] ;  Service: Gynecology;  Laterality: N/A;  request 7:30am OR start in [place] Gyn block requests 45 minutes   CHOLECYSTECTOMY N/A 12/28/2013   Procedure: LAPAROSCOPIC CHOLECYSTECTOMY;  Surgeon: Claud Kelp, MD;  Location: [facility];  Service: General;  Laterality: N/A;   MUSCLE BIOPSY Right 11/06/2022   Procedure: RIGHT THIGH MUSCLE BIOPSY;  Surgeon: Andria Meuse, MD;  Location: [facility] OR;  Service: General;  Laterality: Right;    Social History:  reports that she has never smoked. She has never used smokeless tobacco. She reports that she does not drink alcohol and does not use drugs.  No Known Allergies  Family History  Problem Relation Age of Onset   Hypertension Father    Hypertension Mother    Hypertension Sister    Diabetes Maternal Grandmother    Diabetes Maternal Grandfather      Prior to Admission medications   Medication Sig Start Date End Date Taking? Authorizing Provider  aspirin-acetaminophen-caffeine (EXCEDRIN MIGRAINE) [phone] MG tablet Take 2 tablets by mouth every 6 (six) hours as needed for headache or migraine.   Yes [provider]  azathioprine (IMURAN) 100 MG tablet Take 150 mg by mouth daily.   Yes [provider]  clobetasol ointment (TEMOVATE) 0.05 % Apply 1 application  topically daily as needed (rash). 06/04/21  Yes [provider]  gabapentin (NEURONTIN) 300 MG capsule Take 1 capsule (300 mg total) by mouth 3 (three) times daily. Emily Hicks taking differently: Take 300 mg by mouth daily as needed (nerve pain). 08/20/22  Yes Sater, Pearletha Furl, MD  hydroxychloroquine (PLAQUENIL) 200 MG tablet Take 200 mg by mouth 2 (two) times daily.   Yes [provider]  ketoconazole (NIZORAL) 2 % shampoo Apply 1 Application topically once a week.   Yes [provider]  TYLENOL 325 MG CAPS Take 650 mg by mouth every 6 (six) hours as needed (for headaches or pain).   Yes [provider]   metoprolol succinate (TOPROL XL) 25 MG 24 hr tablet Take 1 tablet (25 mg total) by mouth at bedtime. Emily Hicks not taking: Reported on 11/04/2022 08/04/22   Riley Lam A, MD  pantoprazole (PROTONIX) 40 MG tablet Take 1 tablet (40 mg total) by mouth daily. Emily Hicks not taking: Reported on 12/11/2022 11/10/22   Almon Hercules, MD  Vitamin D, Ergocalciferol, (DRISDOL) 1.25 MG (50000 UNIT) CAPS capsule Take 1 capsule (50,000 Units total) by mouth every 7 (seven) days. Emily Hicks not taking: Reported on 11/04/2022 08/16/21   Marcine Matar, MD    Physical Exam:  Vitals:   02/23/23 1004 02/23/23 1300 02/23/23 1405 02/23/23 1500  BP:  (!) 160/103 (!) 160/102 (!) 167/120  Pulse:  87 83   Resp:   18   Temp:   98.4 F (36.9 C)   TempSrc:   Other (Comment)   SpO2:  100% 100%   Weight: 88.5 kg     Height: 5\' 1"  (1.549 m)      Wt Readings from Last 3 Encounters:  02/23/23 88.5 kg  02/16/23 88.5 kg  12/11/22 88.7 kg   Body mass index is 36.84 kg/m.  General: Obese built, not in obvious distress HENT: Normocephalic, No scleral pallor or icterus noted. Oral mucosa is moist.  Chest:  Clear breath sounds.  Diminished breath sounds bilaterally.Marland Kitchen No crackles or wheezes.  CVS: S1 &S2 heard. No murmur.  Regular rate and rhythm. Abdomen: Soft, nontender, nondistended.  Bowel sounds are heard. No abdominal mass palpated Extremities: No cyanosis, clubbing or edema.  Peripheral pulses are palpable.  No obvious joint swelling. Psych: Alert, awake and oriented, normal mood CNS:  No cranial nerve deficits.  Power equal in all extremities.   Skin: Warm and dry.  Skin appears  dry.  Labs on Admission:   CBC: Recent Labs  Lab 02/23/23 1100  WBC 5.1  NEUTROABS 4.3  HGB 10.0*  HCT 32.1*  MCV 86.5  PLT 287    Basic Metabolic Panel: Recent Labs  Lab 02/23/23 1100  NA 142  K 3.8  CL 112*  CO2 22  GLUCOSE 90  BUN 12  CREATININE 0.92  CALCIUM 8.4*    Liver Function Tests: No results  for input(s): "AST", "ALT", "ALKPHOS", "BILITOT", "PROT", "ALBUMIN" in the last 168 hours. No results for input(s): "LIPASE", "AMYLASE" in the last 168 hours. No results for input(s): "AMMONIA" in the last 168 hours.  Cardiac Enzymes: No results for input(s): "CKTOTAL", "CKMB", "CKMBINDEX", "TROPONINI" in the last 168 hours.  BNP (last 3 results) No results for input(s): "BNP" in the last 8760 hours.  ProBNP (last 3 results) No results for input(s): "PROBNP" in the last 8760 hours.  CBG: No results for input(s): "GLUCAP" in the last 168 hours.  Lipase     Component Value Date/Time   LIPASE 45 08/04/2018 2141     Urinalysis    Component Value Date/Time   COLORURINE YELLOW 11/05/2022 1240   APPEARANCEUR HAZY (A) 11/05/2022 1240   LABSPEC 1.017 11/05/2022 1240   PHURINE 6.0 11/05/2022 1240   GLUCOSEU NEGATIVE 11/05/2022 1240   HGBUR MODERATE (A) 11/05/2022 1240   BILIRUBINUR NEGATIVE 11/05/2022 1240   BILIRUBINUR Neg 05/08/2010 1553   KETONESUR NEGATIVE 11/05/2022 1240   PROTEINUR 30 (A) 11/05/2022 1240   UROBILINOGEN 1.0 11/11/2013 0150   NITRITE NEGATIVE 11/05/2022 1240   LEUKOCYTESUR NEGATIVE 11/05/2022 1240     Drugs of Abuse     Component Value Date/Time   LABOPIA NONE DETECTED 11/05/2022 1240   COCAINSCRNUR NONE DETECTED 11/05/2022 1240   LABBENZ NONE DETECTED 11/05/2022 1240   AMPHETMU NONE DETECTED 11/05/2022 1240   THCU NONE DETECTED 11/05/2022 1240   LABBARB NONE DETECTED 11/05/2022 1240      Radiological Exams on Admission: MR Brain W and Wo Contrast Result Date: 02/23/2023 CLINICAL DATA:  Demyelinating disease EXAM: MRI HEAD WITHOUT AND WITH CONTRAST TECHNIQUE: Multiplanar, multiecho pulse sequences of the brain and surrounding structures were obtained without and with intravenous contrast. CONTRAST:  10mL GADAVIST GADOBUTROL 1 MMOL/ML IV SOLN COMPARISON:  Same-day head, brain MR 08/05/2022 FINDINGS: Brain: There are multifocal regions of predominantly  cortical diffusion restriction, involving the bilateral insular regions, right occipital lobe, in the left cerebellum. There is contrast enhancement associated with these regions of diffusion restriction, which is predominantly cortical. There is disproportionate degree of T2/FLAIR hyperintense signal abnormality surrounding the sites of diffusion restriction or contrast enhancement. Vascular: Normal flow voids. Skull and upper cervical spine: Normal marrow signal. Sinuses/Orbits: No middle ear or mastoid effusion. Paranasal sinuses are notable  for mucosal thickening in the bilateral maxillary sinuses. Orbits are unremarkable. Other: None. IMPRESSION: Multifocal regions of predominantly cortical diffusion restriction with associated contrast enhancement, involving the bilateral insular regions, right occipital lobe, and left cerebellum. Findings are nonspecific, but with predominantly cortical involvement, CNS vasculitis is a differential consideration. Given Emily Hicks's history, an additional differential consideration is demyelinating disease. Recommend further evaluation with a CTA head and neck angiogram. Electronically Signed   By: Lorenza Cambridge M.D.   On: 02/23/2023 15:46   CT Head Wo Contrast Result Date: 02/23/2023 CLINICAL DATA:  Headache. Blurred vision, nausea, and vomiting. History of lupus and multiple sclerosis. EXAM: CT HEAD WITHOUT CONTRAST TECHNIQUE: Contiguous axial images were obtained from the base of the skull through the vertex without intravenous contrast. RADIATION DOSE REDUCTION: This exam was performed according to the departmental dose-optimization program which includes automated exposure control, adjustment of the mA and/or kV according to Emily Hicks size and/or use of iterative reconstruction technique. COMPARISON:  Head MRI 08/05/2022 FINDINGS: Brain: There is a 5 cm region of confluent heterogeneous hypodensity/edema in the posteromedial right parietal lobe. There is localized sulcal  effacement and partial effacement of the right lateral ventricle. Smaller areas of abnormal hypodensity are present in the right frontal operculum and superior left temporal lobe posterior to the sylvian fissure. No acute intracranial hemorrhage, midline shift, hydrocephalus, or extra-axial fluid collection is evident. Vascular: No hyperdense vessel. Skull: No acute fracture or suspicious osseous lesion. Sinuses/Orbits: Mucous retention cyst in the left sphenoid sinus. Clear mastoid air cells. Unremarkable orbits. Other: None. IMPRESSION: Multifocal cerebral hypodensities, including a 5 cm edematous area in the right parietal lobe. These are indeterminate with considerations including subacute ischemia, neoplasm, an infectious/inflammatory process, and tumefactive demyelination. A brain MRI without and with contrast is recommended for further evaluation. Electronically Signed   By: Sebastian Ache M.D.   On: 02/23/2023 11:32    EKG: Not available for review   Consultant: Neurology Dr. Iver Nestle  Code Status: Full code  Microbiology none  Antibiotics: None  Family Communication:  Patients' condition and plan of care including tests being ordered have been discussed with the Emily Hicks and the Emily Hicks's sister at bedside who indicate understanding and agree with the plan.   Status is: Inpatient  Severity of Illness: The appropriate Emily Hicks status for this Emily Hicks is INPATIENT. Inpatient status is judged to be reasonable and necessary in order to provide the required intensity of service to ensure the Emily Hicks's safety. The Emily Hicks's presenting symptoms, physical exam findings, and initial radiographic and laboratory data in the context of their chronic comorbidities is felt to place them at high risk for further clinical deterioration. Furthermore, it is not anticipated that the Emily Hicks will be medically stable for discharge from the hospital within 2 midnights of admission.   * I certify that at the point  of admission it is my clinical judgment that the Emily Hicks will require inpatient hospital care spanning beyond 2 midnights from the point of admission due to high intensity of service, high risk for further deterioration and high frequency of surveillance required.*  Signed, Joycelyn Das, MD [facility] 02/23/2023

## 2023-02-23 NOTE — ED Notes (Signed)
[facility] called. 

## 2023-02-23 NOTE — ED Provider Notes (Signed)
[place] EMERGENCY DEPARTMENT AT [facility] Provider Note   Neuro team requested LP attempt prior to transfer from [facility] to [facility].   Patient consented to LP attempt.   LP attempted x 1 - unsuccessful. Patient tolerated poorly.    ED Results / Procedures / Treatments   Labs (all labs ordered are listed, but only abnormal results are displayed) Labs Reviewed  CBC WITH DIFFERENTIAL/PLATELET - Abnormal; Notable for the following components:      Result Value   RBC 3.71 (*)    Hemoglobin 10.0 (*)    HCT 32.1 (*)    Lymphs Abs 0.5 (*)    All other components within normal limits  BASIC METABOLIC PANEL - Abnormal; Notable for the following components:   Chloride 112 (*)    Calcium 8.4 (*)    All other components within normal limits  CSF CULTURE W GRAM STAIN  HCG, QUANTITATIVE, PREGNANCY  VITAMIN D 25 HYDROXY (VIT D DEFICIENCY, FRACTURES)  TSH  HEMOGLOBIN A1C  CBC  COMPREHENSIVE METABOLIC PANEL  MAGNESIUM  PHOSPHORUS  CSF CELL COUNT WITH DIFFERENTIAL  PROTEIN AND GLUCOSE, CSF    Radiology MR Brain W and Wo Contrast Result Date: 02/23/2023 CLINICAL DATA:  Demyelinating disease EXAM: MRI HEAD WITHOUT AND WITH CONTRAST TECHNIQUE: Multiplanar, multiecho pulse sequences of the brain and surrounding structures were obtained without and with intravenous contrast. CONTRAST:  10mL GADAVIST GADOBUTROL 1 MMOL/ML IV SOLN COMPARISON:  Same-day head, brain MR 08/05/2022 FINDINGS: Brain: There are multifocal regions of predominantly cortical diffusion restriction, involving the bilateral insular regions, right occipital lobe, in the left cerebellum. There is contrast enhancement associated with these regions of diffusion restriction, which is predominantly cortical. There is disproportionate degree of T2/FLAIR hyperintense signal abnormality surrounding the sites of diffusion restriction or contrast enhancement. Vascular: Normal flow voids. Skull and upper cervical spine: Normal marrow  signal. Sinuses/Orbits: No middle ear or mastoid effusion. Paranasal sinuses are notable for mucosal thickening in the bilateral maxillary sinuses. Orbits are unremarkable. Other: None. IMPRESSION: Multifocal regions of predominantly cortical diffusion restriction with associated contrast enhancement, involving the bilateral insular regions, right occipital lobe, and left cerebellum. Findings are nonspecific, but with predominantly cortical involvement, CNS vasculitis is a differential consideration. Given patient's history, an additional differential consideration is demyelinating disease. Recommend further evaluation with a CTA head and neck angiogram. Electronically Signed   By: Lorenza Cambridge M.D.   On: 02/23/2023 15:46   CT Head Wo Contrast Result Date: 02/23/2023 CLINICAL DATA:  Headache. Blurred vision, nausea, and vomiting. History of lupus and multiple sclerosis. EXAM: CT HEAD WITHOUT CONTRAST TECHNIQUE: Contiguous axial images were obtained from the base of the skull through the vertex without intravenous contrast. RADIATION DOSE REDUCTION: This exam was performed according to the departmental dose-optimization program which includes automated exposure control, adjustment of the mA and/or kV according to patient size and/or use of iterative reconstruction technique. COMPARISON:  Head MRI 08/05/2022 FINDINGS: Brain: There is a 5 cm region of confluent heterogeneous hypodensity/edema in the posteromedial right parietal lobe. There is localized sulcal effacement and partial effacement of the right lateral ventricle. Smaller areas of abnormal hypodensity are present in the right frontal operculum and superior left temporal lobe posterior to the sylvian fissure. No acute intracranial hemorrhage, midline shift, hydrocephalus, or extra-axial fluid collection is evident. Vascular: No hyperdense vessel. Skull: No acute fracture or suspicious osseous lesion. Sinuses/Orbits: Mucous retention cyst in the left  sphenoid sinus. Clear mastoid air cells. Unremarkable orbits. Other: None. IMPRESSION:  Multifocal cerebral hypodensities, including a 5 cm edematous area in the right parietal lobe. These are indeterminate with considerations including subacute ischemia, neoplasm, an infectious/inflammatory process, and tumefactive demyelination. A brain MRI without and with contrast is recommended for further evaluation. Electronically Signed   By: Sebastian Ache M.D.   On: 02/23/2023 11:32    Procedures Lumbar Puncture  Date/Time: 02/23/2023 6:30 PM  Performed by: Wynetta Fines, MD Authorized by: Wynetta Fines, MD   Consent:    Consent obtained:  Written   Consent given by:  Patient   Risks, benefits, and alternatives were discussed: yes     Risks discussed:  Bleeding, headache, infection, nerve damage, pain and repeat procedure   Alternatives discussed:  No treatment Universal protocol:    Site/side marked: yes     Patient identity confirmed:  Verbally with patient Pre-procedure details:    Procedure purpose:  Diagnostic   Preparation: Patient was prepped and draped in usual sterile fashion   Anesthesia:    Anesthesia method:  Local infiltration   Local anesthetic:  Lidocaine 1% w/o epi Procedure details:    Lumbar space:  L4-L5 interspace   Patient position:  L lateral decubitus   Needle gauge:  18   Needle type:  Spinal needle - Quincke tip   Needle length (in):  3.5   Ultrasound guidance: no     Number of attempts:  1 Post-procedure details:    Puncture site:  Adhesive bandage applied   Procedure completion:  Tolerated with difficulty Comments:     LP Attempted x1 at request of Neuro team. Patient tolerated attempt poorly. No CSF obtained.      Medications Ordered in ED Medications  LORazepam (ATIVAN) injection 1 mg (1 mg Intravenous Given 02/23/23 1357)  hydroxychloroquine (PLAQUENIL) tablet 200 mg (has no administration in time range)  metoprolol succinate (TOPROL-XL) 24 hr  tablet 25 mg (has no administration in time range)  pantoprazole (PROTONIX) EC tablet 40 mg (has no administration in time range)  gabapentin (NEURONTIN) capsule 300 mg (has no administration in time range)  clobetasol ointment (TEMOVATE) 0.05 % 1 Application (has no administration in time range)  ketoconazole (NIZORAL) 2 % shampoo 1 Application (has no administration in time range)  enoxaparin (LOVENOX) injection 40 mg (has no administration in time range)  sodium chloride flush (NS) 0.9 % injection 3 mL (has no administration in time range)  sodium chloride flush (NS) 0.9 % injection 3 mL (has no administration in time range)  0.9 %  sodium chloride infusion (has no administration in time range)  acetaminophen (TYLENOL) tablet 650 mg (has no administration in time range)    Or  acetaminophen (TYLENOL) suppository 650 mg (has no administration in time range)  HYDROcodone-acetaminophen (NORCO/VICODIN) 5-325 MG per tablet 1-2 tablet (has no administration in time range)  polyethylene glycol (MIRALAX / GLYCOLAX) packet 17 g (has no administration in time range)  ondansetron (ZOFRAN) tablet 4 mg (has no administration in time range)    Or  ondansetron (ZOFRAN) injection 4 mg (has no administration in time range)  hydrALAZINE (APRESOLINE) injection 10 mg (10 mg Intravenous Given 02/23/23 1743)  lactated ringers infusion (has no administration in time range)  ketorolac (TORADOL) 30 MG/ML injection 30 mg (has no administration in time range)  zolpidem (AMBIEN) tablet 5 mg (has no administration in time range)  lactated ringers bolus 1,000 mL (0 mLs Intravenous Stopped 02/23/23 1644)  ketorolac (TORADOL) 15 MG/ML injection 15 mg (15 mg Intravenous Given  02/23/23 1125)  prochlorperazine (COMPAZINE) injection 10 mg (10 mg Intravenous Given 02/23/23 1126)  diphenhydrAMINE (BENADRYL) injection 12.5 mg (12.5 mg Intravenous Given 02/23/23 1127)  gadobutrol (GADAVIST) 1 MMOL/ML injection 10 mL (10 mLs  Intravenous Contrast Given 02/23/23 1405)        Wynetta Fines, MD 02/23/23 [phone]

## 2023-02-23 NOTE — ED Triage Notes (Signed)
Pt to ED by POV with c/o HA. States she was seen a week ago for same complaint but left AMA. Headache started last night took OTC med with no relief. She reports blurred vision, nausea and 1 episode of emesis. She had confusion last week with HA but denies confusion today. No HX of HTN no RX for HTN. Has HX of Lupus.

## 2023-02-23 NOTE — Hospital Course (Addendum)
38 y.o. female with past medical history significant for multiple sclerosis, lupus, GERD, cervical intraepithelial neoplasia, cholelithiasis who presented to [facility] ED on 1/28 with 2-week history of progressive generalized weakness, malaise, headache.  Also endorsed nasal congestion, nausea, nosebleeds.    Currently on Plaquenil and azathioprine for her lupus. Followed by neurology outpatient, Dr. Epimenio Foot but currently not on any additional disease modifying therapy for her MS currently.  Sister also endorses that she has been having a hard time "gripping" items.  She has been taking Excedrin without much relief regarding her headaches.  Denies any urinary symptoms, no visual changes, no fever, no chills, no rigors, no muscle aches.   In the ED, temperature 97.9 F, HR 107, RR 18, BP 175/119, SpO2 99% on room air.  WBC 5.1, hemoglobin 10.0, platelet count 287.  Sodium 142, potassium 3.8, chloride 112, CO2 22, glucose 90, BUN 12, creatinine 0.92.  Hemoglobin A1c 5.5.  hCG negative.  CT head without contrast with multifocal cerebral hypodensities including 5 cm edematous area right parietal lobe, indeterminate with considerations including subacute ischemic, neoplasm, infectious/inflammatory process, tumefactive demyelination.  Neurology was consulted and recommended transfer to [facility].  TRH was consulted for admission and patient was transferred to [facility].

## 2023-02-23 NOTE — ED Provider Notes (Signed)
[place] EMERGENCY DEPARTMENT AT [facility] Provider Note   CSN: [id number] Arrival date & time: 02/23/23  1660     History  Chief Complaint  Patient presents with   Migraine    Emily Hicks is a 38 y.o. female.  38 year old female with past medical history significant for lupus, multiple sclerosis presents today for concern of headache.  She states this has been ongoing now for couple weeks and is associated with fatigue, nausea, vomiting.  She has taken Excedrin without relief.  Does have history of headaches similar to this.  She is also concerned her blood pressure is elevated.  She states typically Excedrin would help.  She states she gets various symptoms and presentations with her lupus flareup so she is unsure if this is part of that.  Denies visual change.  Apparently she endorsed blurred vision but to me she currently denies any vision change.  The history is provided by the patient. No language interpreter was used.       Home Medications Prior to Admission medications   Medication Sig Start Date End Date Taking? Authorizing Provider  aspirin-acetaminophen-caffeine (EXCEDRIN MIGRAINE) [phone] MG tablet Take 2 tablets by mouth every 6 (six) hours as needed for headache or migraine.   Yes [provider]  azathioprine (IMURAN) 100 MG tablet Take 150 mg by mouth daily.   Yes [provider]  clobetasol ointment (TEMOVATE) 0.05 % Apply 1 application  topically daily as needed (rash). 06/04/21  Yes [provider]  gabapentin (NEURONTIN) 300 MG capsule Take 1 capsule (300 mg total) by mouth 3 (three) times daily. Patient taking differently: Take 300 mg by mouth daily as needed (nerve pain). 08/20/22  Yes Sater, Pearletha Furl, MD  hydroxychloroquine (PLAQUENIL) 200 MG tablet Take 200 mg by mouth 2 (two) times daily.   Yes [provider]  ketoconazole (NIZORAL) 2 % shampoo Apply 1 Application topically once a week.   Yes [provider]  TYLENOL 325 MG CAPS Take 650 mg by mouth every 6 (six) hours as needed (for headaches or pain).   Yes [provider]  metoprolol succinate (TOPROL XL) 25 MG 24 hr tablet Take 1 tablet (25 mg total) by mouth at bedtime. Patient not taking: Reported on 11/04/2022 08/04/22   Riley Lam A, MD  pantoprazole (PROTONIX) 40 MG tablet Take 1 tablet (40 mg total) by mouth daily. Patient not taking: Reported on 12/11/2022 11/10/22   Almon Hercules, MD  Vitamin D, Ergocalciferol, (DRISDOL) 1.25 MG (50000 UNIT) CAPS capsule Take 1 capsule (50,000 Units total) by mouth every 7 (seven) days. Patient not taking: Reported on 11/04/2022 08/16/21   Marcine Matar, MD      Allergies    Patient has no known allergies.    Review of Systems   Review of Systems  Constitutional:  Negative for chills and fever.  Eyes:  Negative for visual disturbance.  Respiratory:  Negative for shortness of breath.   Cardiovascular:  Negative for chest pain.  Neurological:  Negative for light-headedness.  All other systems reviewed and are negative.   Physical Exam Updated Vital Signs BP (!) 175/119 (BP Location: Right Arm)   Pulse (!) 107   Temp 97.9 F (36.6 C) (Oral)   Resp 18   Ht 5\' 1"  (1.549 m)   Wt 88.5 kg   LMP 01/23/2023   SpO2 99%   BMI 36.84 kg/m  Physical Exam Vitals and nursing note reviewed.  Constitutional:  General: She is not in acute distress.    Appearance: Normal appearance. She is not ill-appearing.  HENT:     Head: Normocephalic and atraumatic.     Nose: Nose normal.  Eyes:     General: No scleral icterus.    Extraocular Movements: Extraocular movements intact.     Conjunctiva/sclera: Conjunctivae normal.  Cardiovascular:     Rate and Rhythm: Normal rate and regular rhythm.     Pulses: Normal pulses.     Heart sounds: Normal heart sounds.  Pulmonary:     Effort: Pulmonary effort is normal. No respiratory distress.     Breath sounds: Normal breath  sounds. No wheezing or rales.  Abdominal:     General: There is no distension.     Tenderness: There is no abdominal tenderness.  Musculoskeletal:        General: Normal range of motion.     Cervical back: Normal range of motion.  Skin:    General: Skin is warm and dry.  Neurological:     General: No focal deficit present.     Mental Status: She is alert and oriented to person, place, and time. Mental status is at baseline.     Cranial Nerves: No cranial nerve deficit.     Motor: No weakness.     ED Results / Procedures / Treatments   Labs (all labs ordered are listed, but only abnormal results are displayed) Labs Reviewed  CBC WITH DIFFERENTIAL/PLATELET - Abnormal; Notable for the following components:      Result Value   RBC 3.71 (*)    Hemoglobin 10.0 (*)    HCT 32.1 (*)    Lymphs Abs 0.5 (*)    All other components within normal limits  BASIC METABOLIC PANEL    EKG None  Radiology No results found.  Procedures Procedures    Medications Ordered in ED Medications  lactated ringers bolus 1,000 mL (has no administration in time range)  ketorolac (TORADOL) 15 MG/ML injection 15 mg (has no administration in time range)  prochlorperazine (COMPAZINE) injection 10 mg (has no administration in time range)  diphenhydrAMINE (BENADRYL) injection 12.5 mg (has no administration in time range)    ED Course/ Medical Decision Making/ A&P Clinical Course as of 02/23/23 1508  Mon Feb 23, 2023  1200 CT head with some abnormalities.  Will further evaluate with MRI brain with and without contrast. [AA]  1507 Discussed with neurologist Dr. Iver Nestle.  She recommends admission.  Prior to starting steroids she is considering if the patient needs lumbar puncture.  She will let us know.  Will discuss with hospitalist for admission. [AA]    Clinical Course User Index [AA] Marita Kansas, PA-C                                 Medical Decision Making Amount and/or Complexity of Data  Reviewed Labs: ordered. Radiology: ordered.  Risk Prescription drug management.   Medical Decision Making / ED Course   This patient presents to the ED for concern of headache, elevated blood pressure, this involves an extensive number of treatment options, and is a complaint that carries with it a high risk of complications and morbidity.  The differential diagnosis includes [place] cancer emergency, migraine headache, cluster headache, sinus headache, lupus flareup, MS flareup  MDM: 38 year old female with past medical history significant for lupus, multiple sclerosis presents today for concern of headache with associated elevated  blood pressure, generalized weakness with associated nausea and vomiting.  Excedrin at home has not been significantly helpful.  Will obtain CBC, BMP, CT head and provide migraine cocktail and reevaluate.  CT head reveals multifocal cerebral hypodensities including a 5 cm edematous area on the right parietal lobe.  Will further evaluate with MRI with and without contrast.  MRI brain with and without contrast is still currently pending for radiology interpretation.  Images are available.  There is a gross abnormality in the right parietal lobe.  I did discuss the findings with Dr. Iver Nestle prior to the official read.  She reviewed the images.  She does feel the patient needs to be admitted.  This can likely be a MS flareup however she also has concern for potential rare etiology.  She recommends admission to [facility].  She states she will reach out to [place] regarding the potential lumbar puncture prior to starting the steroids.  I will discuss the case with hospitalist for admission.  Discussed with hospitalist will evaluate patient for admission.   Lab Tests: -I ordered, reviewed, and interpreted labs.   The pertinent results include:   Labs Reviewed  CBC WITH DIFFERENTIAL/PLATELET - Abnormal; Notable for the following components:      Result Value   RBC 3.71 (*)     Hemoglobin 10.0 (*)    HCT 32.1 (*)    Lymphs Abs 0.5 (*)    All other components within normal limits  BASIC METABOLIC PANEL      EKG  EKG Interpretation Date/Time:    Ventricular Rate:    PR Interval:    QRS Duration:    QT Interval:    QTC Calculation:   R Axis:      Text Interpretation:           Imaging Studies ordered: I ordered imaging studies including ct head, MRI brain with and without contrast I independently visualized and interpreted imaging. I agree with the radiologist interpretation   Medicines ordered and prescription drug management: Meds ordered this encounter  Medications   lactated ringers bolus 1,000 mL   ketorolac (TORADOL) 15 MG/ML injection 15 mg   prochlorperazine (COMPAZINE) injection 10 mg   diphenhydrAMINE (BENADRYL) injection 12.5 mg    -I have reviewed the patients home medicines and have made adjustments as needed   Consultations Obtained: I requested consultation with the Dr. Iver Nestle and neurology,  and discussed lab and imaging findings as well as pertinent plan - they recommend: As above  Reevaluation: After the interventions noted above, I reevaluated the patient and found that they have :stayed the same  Co morbidities that complicate the patient evaluation  Past Medical History:  Diagnosis Date   Cervical intraepithelial neoplasia III    Gallstones    GERD (gastroesophageal reflux disease)    only prn OTC occasionally   History of blood transfusion 2020   Lupus dx 2020   Multiple sclerosis (HCC) dx 2020   Numbness and tingling of left leg       Dispostion: Questionable discussed with hospitalist will evaluate patient for admission.  Pending neurologist recommendations in terms of needing a lumbar puncture.  Signed out to oncoming provider follow-up neurology recommendations.   Final Clinical Impression(s) / ED Diagnoses Final diagnoses:  Brain lesion  Bad headache    Rx / DC Orders ED Discharge Orders      None         Marita Kansas, PA-C 02/23/23 1526    Plunkett, Hayes,  MD 02/23/23 1527

## 2023-02-23 NOTE — ED Notes (Signed)
ED TO INPATIENT HANDOFF REPORT  Name/Age/Gender Emily Hicks 38 y.o. female  Code Status    Code Status Orders  (From admission, onward)           Start     Ordered   02/23/23 1629  Full code  Continuous       Question:  By:  Answer:  Consent: discussion documented in EHR   02/23/23 1633           Code Status History     Date Active Date Inactive Code Status Order ID Comments User Context   11/04/2022 2234 11/09/2022 1705 Full Code [id number]  Therisa Doyne, MD ED   06/16/2022 1746 06/18/2022 1558 Full Code [id number]  Lurline Del, MD ED   06/13/2021 2224 06/15/2021 1644 Full Code [id number]  Therisa Doyne, MD Inpatient   01/16/2020 0600 01/16/2020 1452 Full Code [id number]  Theresia Majors, MD Inpatient   07/27/2018 1648 08/16/2018 2051 Full Code [id number]  Burnadette Pop, MD ED       Home/SNF/Other Home  Chief Complaint Multiple sclerosis [facility]) [G35]  Level of Care/Admitting Diagnosis ED Disposition     ED Disposition  Admit   Condition  --   Comment  Hospital Area: [facility] [[id number]]  Level of Care: Telemetry Medical [104]  May admit patient to [facility] or [facility] if equivalent level of care is available:: No  Covid Evaluation: Asymptomatic - no recent exposure (last 10 days) testing not required  Diagnosis: Multiple sclerosis (HCC) [340.ICD-9-CM]  Admitting Physician: Joycelyn Das [[id number]]  Attending Physician: Joycelyn Das [[id number]]  Certification:: I certify this patient will need inpatient services for at least 2 midnights  Expected Medical Readiness: 02/25/2023          Medical History Past Medical History:  Diagnosis Date   Cervical intraepithelial neoplasia III    Gallstones    GERD (gastroesophageal reflux disease)    only prn OTC occasionally   History of blood transfusion 2020   Lupus dx 2020   Multiple sclerosis (HCC) dx 2020   Numbness and tingling of left leg     Allergies No  Known Allergies  IV Location/Drains/Wounds Patient Lines/Drains/Airways Status     Active Line/Drains/Airways     Name Placement date Placement time Site Days   Peripheral IV 02/23/23 22 G Right Antecubital 02/23/23  1124  Antecubital  less than 1            Labs/Imaging Results for orders placed or performed during the hospital encounter of 02/23/23 (from the past 48 hours)  CBC with Differential     Status: Abnormal   Collection Time: 02/23/23 11:00 AM  Result Value Ref Range   WBC 5.1 4.0 - 10.5 K/uL   RBC 3.71 (L) 3.87 - 5.11 MIL/uL   Hemoglobin 10.0 (L) 12.0 - 15.0 g/dL   HCT 66.0 (L) 63.0 - 16.0 %   MCV 86.5 80.0 - 100.0 fL   MCH 27.0 26.0 - 34.0 pg   MCHC 31.2 30.0 - 36.0 g/dL   RDW 10.9 32.3 - 55.7 %   Platelets 287 150 - 400 K/uL   nRBC 0.0 0.0 - 0.2 %   Neutrophils Relative % 86 %   Neutro Abs 4.3 1.7 - 7.7 K/uL   Lymphocytes Relative 9 %   Lymphs Abs 0.5 (L) 0.7 - 4.0 K/uL   Monocytes Relative 4 %   Monocytes Absolute 0.2 0.1 - 1.0 K/uL   Eosinophils Relative 0 %  Eosinophils Absolute 0.0 0.0 - 0.5 K/uL   Basophils Relative 0 %   Basophils Absolute 0.0 0.0 - 0.1 K/uL   Immature Granulocytes 1 %   Abs Immature Granulocytes 0.07 0.00 - 0.07 K/uL    Comment: Performed at  Hospital, [address], [place], [place] [address]  Basic metabolic panel     Status: Abnormal   Collection Time: 02/23/23 11:00 AM  Result Value Ref Range   Sodium 142 135 - 145 mmol/L   Potassium 3.8 3.5 - 5.1 mmol/L   Chloride 112 (H) 98 - 111 mmol/L   CO2 22 22 - 32 mmol/L   Glucose, Bld 90 70 - 99 mg/dL    Comment: Glucose reference range applies only to samples taken after fasting for at least 8 hours.   BUN 12 6 - 20 mg/dL   Creatinine, Ser 6.21 0.44 - 1.00 mg/dL   Calcium 8.4 (L) 8.9 - 10.3 mg/dL   GFR, Estimated >30 >86 mL/min    Comment: (NOTE) Calculated using the CKD-EPI Creatinine Equation (2021)    Anion gap 8 5 - 15    Comment: Performed at  [facility], [address], [place], [place] [address]  hCG, quantitative, pregnancy     Status: None   Collection Time: 02/23/23 11:00 AM  Result Value Ref Range   hCG, Beta Chain, Quant, S <1 <5 mIU/mL    Comment:          GEST. AGE      CONC.  (mIU/mL)   <=1 WEEK        5 - 50     2 WEEKS       50 - 500     3 WEEKS       100 - 10,000     4 WEEKS     1,000 - 30,000     5 WEEKS     3,500 - 115,000   6-8 WEEKS     12,000 - 270,000    12 WEEKS     15,000 - 220,000        FEMALE AND NON-PREGNANT FEMALE:     LESS THAN 5 mIU/mL Performed at [facility], [address], [place], [place] [address]    MR Brain W and Wo Contrast Result Date: 02/23/2023 CLINICAL DATA:  Demyelinating disease EXAM: MRI HEAD WITHOUT AND WITH CONTRAST TECHNIQUE: Multiplanar, multiecho pulse sequences of the brain and surrounding structures were obtained without and with intravenous contrast. CONTRAST:  10mL GADAVIST GADOBUTROL 1 MMOL/ML IV SOLN COMPARISON:  Same-day head, brain MR 08/05/2022 FINDINGS: Brain: There are multifocal regions of predominantly cortical diffusion restriction, involving the bilateral insular regions, right occipital lobe, in the left cerebellum. There is contrast enhancement associated with these regions of diffusion restriction, which is predominantly cortical. There is disproportionate degree of T2/FLAIR hyperintense signal abnormality surrounding the sites of diffusion restriction or contrast enhancement. Vascular: Normal flow voids. Skull and upper cervical spine: Normal marrow signal. Sinuses/Orbits: No middle ear or mastoid effusion. Paranasal sinuses are notable for mucosal thickening in the bilateral maxillary sinuses. Orbits are unremarkable. Other: None. IMPRESSION: Multifocal regions of predominantly cortical diffusion restriction with associated contrast enhancement, involving the bilateral insular regions, right occipital lobe, and left cerebellum.  Findings are nonspecific, but with predominantly cortical involvement, CNS vasculitis is a differential consideration. Given patient's history, an additional differential consideration is demyelinating disease. Recommend further evaluation with a CTA head and neck angiogram. Electronically Signed  By: Lorenza Cambridge M.D.   On: 02/23/2023 15:46   CT Head Wo Contrast Result Date: 02/23/2023 CLINICAL DATA:  Headache. Blurred vision, nausea, and vomiting. History of lupus and multiple sclerosis. EXAM: CT HEAD WITHOUT CONTRAST TECHNIQUE: Contiguous axial images were obtained from the base of the skull through the vertex without intravenous contrast. RADIATION DOSE REDUCTION: This exam was performed according to the departmental dose-optimization program which includes automated exposure control, adjustment of the mA and/or kV according to patient size and/or use of iterative reconstruction technique. COMPARISON:  Head MRI 08/05/2022 FINDINGS: Brain: There is a 5 cm region of confluent heterogeneous hypodensity/edema in the posteromedial right parietal lobe. There is localized sulcal effacement and partial effacement of the right lateral ventricle. Smaller areas of abnormal hypodensity are present in the right frontal operculum and superior left temporal lobe posterior to the sylvian fissure. No acute intracranial hemorrhage, midline shift, hydrocephalus, or extra-axial fluid collection is evident. Vascular: No hyperdense vessel. Skull: No acute fracture or suspicious osseous lesion. Sinuses/Orbits: Mucous retention cyst in the left sphenoid sinus. Clear mastoid air cells. Unremarkable orbits. Other: None. IMPRESSION: Multifocal cerebral hypodensities, including a 5 cm edematous area in the right parietal lobe. These are indeterminate with considerations including subacute ischemia, neoplasm, an infectious/inflammatory process, and tumefactive demyelination. A brain MRI without and with contrast is recommended for  further evaluation. Electronically Signed   By: Sebastian Ache M.D.   On: 02/23/2023 11:32    Pending Labs Unresulted Labs (From admission, onward)     Start     Ordered   02/24/23 0500  CBC  Tomorrow morning,   R        02/23/23 1633   02/24/23 0500  Comprehensive metabolic panel  Tomorrow morning,   R        02/23/23 1633   02/24/23 0500  Magnesium  Tomorrow morning,   R        02/23/23 1633   02/24/23 0500  Phosphorus  Tomorrow morning,   R        02/23/23 1633   02/23/23 1702  CSF cell count with differential  ONCE - STAT,   STAT       Question:  Are there also cytology or pathology orders on this specimen?  Answer:  Yes   02/23/23 1702   02/23/23 1702  Protein and glucose, CSF  ONCE - STAT,   STAT        02/23/23 1702   02/23/23 1702  CSF culture w Gram Stain  ONCE - URGENT,   URGENT       Question:  Are there also cytology or pathology orders on this specimen?  Answer:  Yes   02/23/23 1702   02/23/23 1638  VITAMIN D 25 Hydroxy (Vit-D Deficiency, Fractures)  Add-on,   AD        02/23/23 1637   02/23/23 1638  TSH  Once,   R        02/23/23 1637   02/23/23 1638  Hemoglobin A1c  Add-on,   AD        02/23/23 1637            Vitals/Pain Today's Vitals   02/23/23 1800 02/23/23 1830 02/23/23 1920 02/23/23 1950  BP: (!) 176/108  (!) 182/118   Pulse:   (!) 103   Resp:   18   Temp:  98.3 F (36.8 C)    TempSrc:      SpO2:   100%   Weight:  Height:      PainSc:    7     Isolation Precautions No active isolations  Medications Medications  LORazepam (ATIVAN) injection 1 mg (1 mg Intravenous Given 02/23/23 1357)  hydroxychloroquine (PLAQUENIL) tablet 200 mg (has no administration in time range)  metoprolol succinate (TOPROL-XL) 24 hr tablet 25 mg (has no administration in time range)  pantoprazole (PROTONIX) EC tablet 40 mg (has no administration in time range)  gabapentin (NEURONTIN) capsule 300 mg (has no administration in time range)  clobetasol ointment  (TEMOVATE) 0.05 % 1 Application (has no administration in time range)  ketoconazole (NIZORAL) 2 % shampoo 1 Application (has no administration in time range)  enoxaparin (LOVENOX) injection 40 mg (has no administration in time range)  sodium chloride flush (NS) 0.9 % injection 3 mL (has no administration in time range)  sodium chloride flush (NS) 0.9 % injection 3 mL (has no administration in time range)  0.9 %  sodium chloride infusion (has no administration in time range)  acetaminophen (TYLENOL) tablet 650 mg (has no administration in time range)    Or  acetaminophen (TYLENOL) suppository 650 mg (has no administration in time range)  HYDROcodone-acetaminophen (NORCO/VICODIN) 5-325 MG per tablet 1-2 tablet (has no administration in time range)  polyethylene glycol (MIRALAX / GLYCOLAX) packet 17 g (has no administration in time range)  ondansetron (ZOFRAN) tablet 4 mg (has no administration in time range)    Or  ondansetron (ZOFRAN) injection 4 mg (has no administration in time range)  hydrALAZINE (APRESOLINE) injection 10 mg (10 mg Intravenous Given 02/23/23 1743)  lactated ringers infusion (has no administration in time range)  ketorolac (TORADOL) 30 MG/ML injection 30 mg (has no administration in time range)  zolpidem (AMBIEN) tablet 5 mg (has no administration in time range)  lactated ringers bolus 1,000 mL (0 mLs Intravenous Stopped 02/23/23 1644)  ketorolac (TORADOL) 15 MG/ML injection 15 mg (15 mg Intravenous Given 02/23/23 1125)  prochlorperazine (COMPAZINE) injection 10 mg (10 mg Intravenous Given 02/23/23 1126)  diphenhydrAMINE (BENADRYL) injection 12.5 mg (12.5 mg Intravenous Given 02/23/23 1127)  gadobutrol (GADAVIST) 1 MMOL/ML injection 10 mL (10 mLs Intravenous Contrast Given 02/23/23 1405)    Mobility walks

## 2023-02-23 NOTE — ED Notes (Signed)
Pt CT  

## 2023-02-24 ENCOUNTER — Inpatient Hospital Stay (HOSPITAL_COMMUNITY): Payer: 59

## 2023-02-24 DIAGNOSIS — G939 Disorder of brain, unspecified: Secondary | ICD-10-CM

## 2023-02-24 DIAGNOSIS — R569 Unspecified convulsions: Secondary | ICD-10-CM | POA: Diagnosis not present

## 2023-02-24 DIAGNOSIS — G35 Multiple sclerosis: Secondary | ICD-10-CM | POA: Diagnosis not present

## 2023-02-24 LAB — COMPREHENSIVE METABOLIC PANEL
ALT: 5 U/L (ref 0–44)
AST: 16 U/L (ref 15–41)
Albumin: 2.5 g/dL — ABNORMAL LOW (ref 3.5–5.0)
Alkaline Phosphatase: 39 U/L (ref 38–126)
Anion gap: 8 (ref 5–15)
BUN: 9 mg/dL (ref 6–20)
CO2: 21 mmol/L — ABNORMAL LOW (ref 22–32)
Calcium: 8 mg/dL — ABNORMAL LOW (ref 8.9–10.3)
Chloride: 110 mmol/L (ref 98–111)
Creatinine, Ser: 0.89 mg/dL (ref 0.44–1.00)
GFR, Estimated: >60 mL/min (ref >60)
Glucose, Bld: 92 mg/dL (ref 70–99)
Potassium: 3.3 mmol/L — ABNORMAL LOW (ref 3.5–5.1)
Sodium: 139 mmol/L (ref 135–145)
Total Bilirubin: 0.6 mg/dL (ref 0.0–1.2)
Total Protein: 6.1 g/dL — ABNORMAL LOW (ref 6.5–8.1)

## 2023-02-24 LAB — MAGNESIUM: Magnesium: 1.5 mg/dL — ABNORMAL LOW (ref 1.7–2.4)

## 2023-02-24 LAB — MENINGITIS/ENCEPHALITIS PANEL (CSF)

## 2023-02-24 LAB — CSF CELL COUNT WITH DIFFERENTIAL
RBC Count, CSF: 31 /mm3 — ABNORMAL HIGH
RBC Count, CSF: 4 /mm3 — ABNORMAL HIGH
Tube #: 1
Tube #: 4
WBC, CSF: 3 /mm3 (ref 0–5)
WBC, CSF: 4 /mm3 (ref 0–5)

## 2023-02-24 LAB — CBC
HCT: 30.2 % — ABNORMAL LOW (ref 36.0–46.0)
Hemoglobin: 9.6 g/dL — ABNORMAL LOW (ref 12.0–15.0)
MCH: 27.3 pg (ref 26.0–34.0)
MCHC: 31.8 g/dL (ref 30.0–36.0)
MCV: 85.8 fL (ref 80.0–100.0)
Platelets: 260 10*3/uL (ref 150–400)
RBC: 3.52 MIL/uL — ABNORMAL LOW (ref 3.87–5.11)
RDW: 13.3 % (ref 11.5–15.5)
WBC: 3.7 10*3/uL — ABNORMAL LOW (ref 4.0–10.5)
nRBC: 0 % (ref 0.0–0.2)

## 2023-02-24 LAB — VITAMIN D 25 HYDROXY (VIT D DEFICIENCY, FRACTURES): Vit D, 25-Hydroxy: 24.72 ng/mL — ABNORMAL LOW (ref 30–100)

## 2023-02-24 LAB — PROTEIN AND GLUCOSE, CSF
Glucose, CSF: 38 mg/dL — ABNORMAL LOW (ref 40–70)
Total  Protein, CSF: 76 mg/dL — ABNORMAL HIGH (ref 15–45)

## 2023-02-24 LAB — GLUCOSE, CAPILLARY: Glucose-Capillary: 76 mg/dL (ref 70–99)

## 2023-02-24 LAB — HEMOGLOBIN A1C
Hgb A1c MFr Bld: 5.5 % (ref 4.8–5.6)
Hgb A1c MFr Bld: 5.5 % (ref 4.8–5.6)
Mean Plasma Glucose: 111.15 mg/dL
Mean Plasma Glucose: 111.15 mg/dL

## 2023-02-24 LAB — TSH: TSH: 1.466 u[IU]/mL (ref 0.350–4.500)

## 2023-02-24 LAB — PHOSPHORUS: Phosphorus: 2.7 mg/dL (ref 2.5–4.6)

## 2023-02-24 MED ORDER — POTASSIUM CHLORIDE CRYS ER 20 MEQ PO TBCR
30.0000 meq | EXTENDED_RELEASE_TABLET | ORAL | Status: AC
Start: 2023-02-24 — End: 2023-02-24
  Administered 2023-02-24 (×2): 30 meq via ORAL
  Filled 2023-02-24 (×2): qty 1

## 2023-02-24 MED ORDER — VITAMIN D 25 MCG (1000 UNIT) PO TABS
1000.0000 [IU] | ORAL_TABLET | Freq: Every day | ORAL | Status: DC
  Administered 2023-02-24 – 2023-03-03 (×7): 1000 [IU] via ORAL
  Filled 2023-02-24 (×8): qty 1

## 2023-02-24 MED ORDER — MAGNESIUM SULFATE 4 GM/100ML IV SOLN
4.0000 g | Freq: Once | INTRAVENOUS | Status: AC
  Administered 2023-02-24: 4 g via INTRAVENOUS
  Filled 2023-02-24: qty 100

## 2023-02-24 MED ORDER — AZATHIOPRINE 50 MG PO TABS
150.0000 mg | ORAL_TABLET | Freq: Every day | ORAL | Status: DC
  Administered 2023-02-24 – 2023-03-03 (×8): 150 mg via ORAL
  Filled 2023-02-24 (×8): qty 3

## 2023-02-24 MED ORDER — LIDOCAINE 1 % OPTIME INJ - NO CHARGE
5.0000 mL | Freq: Once | INTRAMUSCULAR | Status: AC
Start: 2023-02-24 — End: 2023-02-24
  Administered 2023-02-24: 3 mL
  Filled 2023-02-24: qty 6

## 2023-02-24 NOTE — Progress Notes (Addendum)
PROGRESS NOTE    Emily Hicks  ZOX:[id number] DOB: 12/19/1985 DOA: 02/23/2023 PCP: Patient, No Pcp Per    Brief Narrative:   Emily Hicks is a 38 y.o. female with past medical history significant for multiple sclerosis, lupus, GERD, cervical intraepithelial neoplasia, cholelithiasis who presented to [facility] ED on 1/28 with 2-week history of progressive generalized weakness, malaise, headache.  Also endorsed nasal congestion, nausea, nosebleeds.    Currently on Plaquenil and azathioprine for her lupus. Followed by neurology outpatient, Dr. Epimenio Foot but currently not on any additional disease modifying therapy for her MS currently.  Sister also endorses that she has been having a hard time "gripping" items.  She has been taking Excedrin without much relief regarding her headaches.  Denies any urinary symptoms, no visual changes, no fever, no chills, no rigors, no muscle aches.  In the ED, temperature 97.9 F, HR 107, RR 18, BP 175/119, SpO2 99% on room air.  WBC 5.1, hemoglobin 10.0, platelet count 287.  Sodium 142, potassium 3.8, chloride 112, CO2 22, glucose 90, BUN 12, creatinine 0.92.  Hemoglobin A1c 5.5.  hCG negative.  CT head without contrast with multifocal cerebral hypodensities including 5 cm edematous area right parietal lobe, indeterminate with considerations including subacute ischemic, neoplasm, infectious/inflammatory process, tumefactive demyelination.  Neurology was consulted and recommended transfer to [facility].  TRH was consulted for admission and patient was transferred to [facility].  Assessment & Plan:   Brain lesions  Patient presenting with headache, progressive generalized weakness over the last few weeks associated with malaise.  Currently immunosuppressed from her lupus on azathioprine and Plaquenil.  CT head without contrast on admission with multifocal cerebral hypodensities including 5 cm edematous area right parietal lobe.  MRI brain with and  without contrast with multifocal regions of cortical diffusion restriction associated with contrast-enhancement bilateral insular regions, right occipital lobe, left cerebellum.  Imaging findings concerning for inflammatory vs CNS vasculitis vs CVA vs lupus cerebritis vs tumefactive MS vs infection. -- Neurology following, appreciate assistance -- IR consulted for lumbar puncture -- CSF analysis for Protein, glucose, cell counts and tubes 1 and 4, meningitis/encephalitis panel, IgG index, gram stain, fungal and bacterial cultures. CSF TB PCR (listed under nucleic acid assay for TB in Epic labs menu), toxo, JCV, CD4 count  -- anti-MOG testing (serum) -- CT angiogram head/neck: Pending -- MR C/T-spine with and without contrast: Pending -- ID consult requested by neurology  Hypokalemia Hypomagnesemia Potassium 3.3, magnesium 1.5, will replete. -- Repeat electrolytes in a.m.  History of multiple sclerosis, lupus Follows with neurology outpatient, Dr. Epimenio Foot -- Continue Plaquenil 200mg  PO BID, azathioprine   Essential hypertension -- Metoprolol succinate 25 mg p.o. daily -- Hydralazine 10 mg p.o. q6h PRN SBP >165  Vitamin D deficiency Vitamin D 25-hydroxy level 24.72.  Currently not taking ergocalciferol as reported at home. -- Cholecalciferol 1000 units p.o. daily  GERD -- Protonix 40 mg p.o. daily  Obesity, class III Body mass index is 36.84 kg/m.  Complicates all facets of care   DVT prophylaxis: enoxaparin (LOVENOX) injection 40 mg Start: 02/23/23 2200    Code Status: Full Code Family Communication: Updated sister present at bedside this morning  Disposition Plan:  Level of care: Telemetry Medical Status is: Inpatient Remains inpatient appropriate because: Needs further workup regarding brain lesions with MR, CT angiogram head/neck, lumbar puncture    Consultants:  Neurology Infectious disease  Procedures:  Lumbar puncture: Pending  Antimicrobials:   None   Subjective:  Patient seen examined bedside, resting calmly.  Lying in bed.  Sister present at bedside.  Continues with generalized weakness, mild frontal headache.  About to go down for lumbar puncture this morning.  Also needs multiple other imaging modalities regarding her blatant lesions including MR C/T-spine and CT head/neck.  No other specific questions or concerns at this time.  Denies visual changes, no chest pain, no shortness of breath, no abdominal pain, no fever/chills/night sweats, no current nausea, no vomiting/diarrhea, no cough/congestion, no paresthesias.  Objective: Vitals:   02/23/23 2053 02/23/23 2341 02/24/23 0330 02/24/23 0818  BP: (!) 161/104 (!) 142/90 (!) 149/107 (!) 156/105  Pulse: (!) 104 98 97 93  Resp: 18 18 16 14   Temp: 98.1 F (36.7 C) 98.7 F (37.1 C) 98.6 F (37 C) 98.4 F (36.9 C)  TempSrc: Oral Oral Oral   SpO2: 100% 99% 100% 100%  Weight:      Height:        Intake/Output Summary (Last 24 hours) at 02/24/2023 1102 Last data filed at 02/24/2023 0855 Gross per 24 hour  Intake 1858.63 ml  Output 250 ml  Net 1608.63 ml   Filed Weights   02/23/23 1004  Weight: 88.5 kg    Examination:  Physical Exam: GEN: NAD, alert and oriented x 3, obese HEENT: NCAT, PERRL, EOMI, sclera clear, MMM PULM: CTAB w/o wheezes/crackles, normal respiratory effort, on room air CV: RRR w/o M/G/R GI: abd soft, NTND, NABS, no R/G/M MSK: no peripheral edema, moves all extremities independently NEURO: CN II-XII intact, no focal deficits, sensation to light touch intact PSYCH: normal mood/affect Integumentary: dry/intact, no rashes or wounds    Data Reviewed: I have personally reviewed following labs and imaging studies  CBC: Recent Labs  Lab 02/23/23 1100 02/24/23 0521  WBC 5.1 3.7*  NEUTROABS 4.3  --   HGB 10.0* 9.6*  HCT 32.1* 30.2*  MCV 86.5 85.8  PLT 287 260   Basic Metabolic Panel: Recent Labs  Lab 02/23/23 1100 02/24/23 0521  NA 142  139  K 3.8 3.3*  CL 112* 110  CO2 22 21*  GLUCOSE 90 92  BUN 12 9  CREATININE 0.92 0.89  CALCIUM 8.4* 8.0*  MG  --  1.5*  PHOS  --  2.7   GFR: Estimated Creatinine Clearance: 88.4 mL/min (by C-G formula based on SCr of 0.89 mg/dL). Liver Function Tests: Recent Labs  Lab 02/24/23 0521  AST 16  ALT 5  ALKPHOS 39  BILITOT 0.6  PROT 6.1*  ALBUMIN 2.5*   No results for input(s): "LIPASE", "AMYLASE" in the last 168 hours. No results for input(s): "AMMONIA" in the last 168 hours. Coagulation Profile: No results for input(s): "INR", "PROTIME" in the last 168 hours. Cardiac Enzymes: No results for input(s): "CKTOTAL", "CKMB", "CKMBINDEX", "TROPONINI" in the last 168 hours. BNP (last 3 results) No results for input(s): "PROBNP" in the last 8760 hours. HbA1C: Recent Labs    02/23/23 1100 02/24/23 0521  HGBA1C 5.5 5.5   CBG: No results for input(s): "GLUCAP" in the last 168 hours. Lipid Profile: No results for input(s): "CHOL", "HDL", "LDLCALC", "TRIG", "CHOLHDL", "LDLDIRECT" in the last 72 hours. Thyroid Function Tests: Recent Labs    02/24/23 0521  TSH 1.466   Anemia Panel: No results for input(s): "VITAMINB12", "FOLATE", "FERRITIN", "TIBC", "IRON", "RETICCTPCT" in the last 72 hours. Sepsis Labs: No results for input(s): "PROCALCITON", "LATICACIDVEN" in the last 168 hours.  Recent Results (from the past 240 hours)  Resp panel by RT-PCR (  RSV, Flu A&B, Covid) Anterior Nasal Swab     Status: None   Collection Time: 02/16/23  6:31 PM   Specimen: Anterior Nasal Swab  Result Value Ref Range Status   SARS Coronavirus 2 by RT PCR NEGATIVE NEGATIVE Final    Comment: (NOTE) SARS-CoV-2 target nucleic acids are NOT DETECTED.  The SARS-CoV-2 RNA is generally detectable in upper respiratory specimens during the acute phase of infection. The lowest concentration of SARS-CoV-2 viral copies this assay can detect is 138 copies/mL. A negative result does not preclude  SARS-Cov-2 infection and should not be used as the sole basis for treatment or other patient management decisions. A negative result may occur with  improper specimen collection/handling, submission of specimen other than nasopharyngeal swab, presence of viral mutation(s) within the areas targeted by this assay, and inadequate number of viral copies(<138 copies/mL). A negative result must be combined with clinical observations, patient history, and epidemiological information. The expected result is Negative.  Fact Sheet for Patients:  BloggerCourse.com  Fact Sheet for Healthcare Providers:  SeriousBroker.it  This test is no t yet approved or cleared by the [place] FDA and  has been authorized for detection and/or diagnosis of SARS-CoV-2 by FDA under an Emergency Use Authorization (EUA). This EUA will remain  in effect (meaning this test can be used) for the duration of the COVID-19 declaration under Section 564(b)(1) of the Act, 21 U.S.C.section 360bbb-3(b)(1), unless the authorization is terminated  or revoked sooner.       Influenza A by PCR NEGATIVE NEGATIVE Final   Influenza B by PCR NEGATIVE NEGATIVE Final    Comment: (NOTE) The Xpert Xpress SARS-CoV-2/FLU/RSV plus assay is intended as an aid in the diagnosis of influenza from Nasopharyngeal swab specimens and should not be used as a sole basis for treatment. Nasal washings and aspirates are unacceptable for Xpert Xpress SARS-CoV-2/FLU/RSV testing.  Fact Sheet for Patients: BloggerCourse.com  Fact Sheet for Healthcare Providers: SeriousBroker.it  This test is not yet approved or cleared by the [place] FDA and has been authorized for detection and/or diagnosis of SARS-CoV-2 by FDA under an Emergency Use Authorization (EUA). This EUA will remain in effect (meaning this test can be used) for the duration of  the COVID-19 declaration under Section 564(b)(1) of the Act, 21 U.S.C. section 360bbb-3(b)(1), unless the authorization is terminated or revoked.     Resp Syncytial Virus by PCR NEGATIVE NEGATIVE Final    Comment: (NOTE) Fact Sheet for Patients: BloggerCourse.com  Fact Sheet for Healthcare Providers: SeriousBroker.it  This test is not yet approved or cleared by the [place] FDA and has been authorized for detection and/or diagnosis of SARS-CoV-2 by FDA under an Emergency Use Authorization (EUA). This EUA will remain in effect (meaning this test can be used) for the duration of the COVID-19 declaration under Section 564(b)(1) of the Act, 21 U.S.C. section 360bbb-3(b)(1), unless the authorization is terminated or revoked.  Performed at [facility], [address], [place], [place] [address]          Radiology Studies: MR Brain W and Wo Contrast Result Date: 02/23/2023 CLINICAL DATA:  Demyelinating disease EXAM: MRI HEAD WITHOUT AND WITH CONTRAST TECHNIQUE: Multiplanar, multiecho pulse sequences of the brain and surrounding structures were obtained without and with intravenous contrast. CONTRAST:  10mL GADAVIST GADOBUTROL 1 MMOL/ML IV SOLN COMPARISON:  Same-day head, brain MR 08/05/2022 FINDINGS: Brain: There are multifocal regions of predominantly cortical diffusion restriction, involving the bilateral insular regions, right occipital lobe,  in the left cerebellum. There is contrast enhancement associated with these regions of diffusion restriction, which is predominantly cortical. There is disproportionate degree of T2/FLAIR hyperintense signal abnormality surrounding the sites of diffusion restriction or contrast enhancement. Vascular: Normal flow voids. Skull and upper cervical spine: Normal marrow signal. Sinuses/Orbits: No middle ear or mastoid effusion. Paranasal sinuses are notable for mucosal thickening in  the bilateral maxillary sinuses. Orbits are unremarkable. Other: None. IMPRESSION: Multifocal regions of predominantly cortical diffusion restriction with associated contrast enhancement, involving the bilateral insular regions, right occipital lobe, and left cerebellum. Findings are nonspecific, but with predominantly cortical involvement, CNS vasculitis is a differential consideration. Given patient's history, an additional differential consideration is demyelinating disease. Recommend further evaluation with a CTA head and neck angiogram. Electronically Signed   By: Lorenza Cambridge M.D.   On: 02/23/2023 15:46   CT Head Wo Contrast Result Date: 02/23/2023 CLINICAL DATA:  Headache. Blurred vision, nausea, and vomiting. History of lupus and multiple sclerosis. EXAM: CT HEAD WITHOUT CONTRAST TECHNIQUE: Contiguous axial images were obtained from the base of the skull through the vertex without intravenous contrast. RADIATION DOSE REDUCTION: This exam was performed according to the departmental dose-optimization program which includes automated exposure control, adjustment of the mA and/or kV according to patient size and/or use of iterative reconstruction technique. COMPARISON:  Head MRI 08/05/2022 FINDINGS: Brain: There is a 5 cm region of confluent heterogeneous hypodensity/edema in the posteromedial right parietal lobe. There is localized sulcal effacement and partial effacement of the right lateral ventricle. Smaller areas of abnormal hypodensity are present in the right frontal operculum and superior left temporal lobe posterior to the sylvian fissure. No acute intracranial hemorrhage, midline shift, hydrocephalus, or extra-axial fluid collection is evident. Vascular: No hyperdense vessel. Skull: No acute fracture or suspicious osseous lesion. Sinuses/Orbits: Mucous retention cyst in the left sphenoid sinus. Clear mastoid air cells. Unremarkable orbits. Other: None. IMPRESSION: Multifocal cerebral hypodensities,  including a 5 cm edematous area in the right parietal lobe. These are indeterminate with considerations including subacute ischemia, neoplasm, an infectious/inflammatory process, and tumefactive demyelination. A brain MRI without and with contrast is recommended for further evaluation. Electronically Signed   By: Sebastian Ache M.D.   On: 02/23/2023 11:32        Scheduled Meds:  enoxaparin (LOVENOX) injection  40 mg Subcutaneous Q24H   hydroxychloroquine  200 mg Oral BID   [START ON 02/26/2023] ketoconazole  1 Application Topical Weekly   lidocaine 1 %  5 mL Other Once   metoprolol succinate  25 mg Oral QHS   pantoprazole  40 mg Oral Daily   potassium chloride  30 mEq Oral Q3H   sodium chloride flush  3 mL Intravenous Q12H   Continuous Infusions:  sodium chloride     lactated ringers 75 mL/hr at 02/24/23 0744     LOS: 1 day    Time spent: 52 minutes spent on chart review, discussion with nursing staff, consultants, updating family and interview/physical exam; more than 50% of that time was spent in counseling and/or coordination of care.    Alvira Philips [place], DO [facility] Available via Epic secure chat 7am-7pm After these hours, please refer to coverage provider listed on [email or web address] 02/24/2023, 11:02 AM

## 2023-02-24 NOTE — TOC Initial Note (Signed)
Transition of Care [facility]) - Initial/Assessment Note    Patient Details  Name: Emily Hicks MRN: [id number] Date of Birth: [birth year]  [facility] CM/SW Contact:    Kermit Balo, RN Phone Number: 02/24/2023, 3:55 PM  Clinical Narrative:                  Pt is from home with her spouse and son. Someone is with her most of the time.  No PCP. CM has left voicemail at: Sarah Ann at [facility] for new pt appointment. Awaiting return call. Pt denies issues with transportation or home medications. Awaiting results of LP and therapy evals. TOC following.  Expected Discharge Plan:  (TBD) Barriers to Discharge: Continued Medical Work up   Patient Goals and CMS Choice            Expected Discharge Plan and Services   Discharge Planning Services: CM Consult   Living arrangements for the past 2 months: Single Family Home                                      Prior Living Arrangements/Services Living arrangements for the past 2 months: Single Family Home Lives with:: Minor Children, Spouse Patient language and need for interpreter reviewed:: Yes Do you feel safe going back to the place where you live?: Yes        Care giver support system in place?: Yes (comment) Current home services: DME (shower seat/ walker) Criminal Activity/Legal Involvement Pertinent to Current Situation/Hospitalization: No - Comment as needed  Activities of Daily Living   ADL Screening (condition at time of admission) Independently performs ADLs?: Yes (appropriate for developmental age) Is the patient deaf or have difficulty hearing?: No Does the patient have difficulty seeing, even when wearing glasses/contacts?: No Does the patient have difficulty concentrating, remembering, or making decisions?: No  Permission Sought/Granted                  Emotional Assessment Appearance:: Appears stated age Attitude/Demeanor/Rapport: Engaged Affect (typically observed):  Accepting Orientation: : Oriented to Self, Oriented to Place, Oriented to  Time, Oriented to Situation   Psych Involvement: No (comment)  Admission diagnosis:  Multiple sclerosis (HCC) [G35] Bad headache [R51.9] Brain lesion [G93.9] Patient Active Problem List   Diagnosis Date Noted   Rhabdomyolysis 11/05/2022   AKI (acute kidney injury) (HCC) 11/04/2022   Recurrent idiopathic pericarditis 08/04/2022   Complicated UTI (urinary tract infection) 06/16/2022   Chest pain 06/13/2021   Sepsis (HCC) 06/13/2021   Rash and nonspecific skin eruption 05/21/2020   CIN III (cervical intraepithelial neoplasia grade III) with severe dysplasia 02/09/2020   HSIL (high grade squamous intraepithelial lesion) on Pap smear of cervix 11/03/2019   Cervical cancer screening 09/30/2019   Essential hypertension 09/19/2018   SIRS (systemic inflammatory response syndrome) (HCC) 08/16/2018   SLE (systemic lupus erythematosus related syndrome) (HCC) 08/16/2018   Pancytopenia (HCC)    Anxiety and depression    Elevated liver enzymes 08/02/2018   Normocytic anemia    Tachycardia 07/16/2018   Multiple sclerosis (HCC) 03/31/2018   Common migraine 08/26/2017   Chondromalacia, patella, left 06/24/2017   Healthcare maintenance 12/20/2015   Contraceptive management 10/11/2014   Hidradenitis suppurativa of left axilla 06/07/2013   Dysplasia of cervix, low grade (CIN 1) 07/03/2011   Obesity, Class I, BMI 30.0-34.9 (see actual BMI) 05/14/2011   PCP:  Patient, No Pcp Per  Pharmacy:   [id number] Gin[place] Otto, [place] - [address] AT [facility] [address] [facility] [address] [place] [address] Phone: [phone] Fax: [phone]  [facility] [address] [place] [address] Phone: [phone] Fax: [phone]     Social Drivers of Health (SDOH) Social History: SDOH Screenings   Food Insecurity: No Food Insecurity (02/23/2023)  Housing: Low Risk   (02/23/2023)  Transportation Needs: No Transportation Needs (02/23/2023)  Utilities: Not At Risk (02/23/2023)  Depression (PHQ2-9): Low Risk  (08/15/2021)  Tobacco Use: Low Risk  (02/23/2023)   SDOH Interventions:     Readmission Risk Interventions     No data to display

## 2023-02-24 NOTE — Progress Notes (Addendum)
NEUROLOGY CONSULT FOLLOW UP NOTE   Date of service: February 24, 2023 Patient Name: Emily Hicks MRN:  [id number] DOB:  [birth year]  Interval Hx/subjective  - Feels stable - Nagging headache, not worsening - Reports intermittent visual changes on the left side, which she felt was migraine aura   Vitals   Vitals:   02/23/23 2053 02/23/23 2341 02/24/23 0330 02/24/23 0818  BP: (!) 161/104 (!) 142/90 (!) 149/107 (!) 156/105  Pulse: (!) 104 98 97 93  Resp: 18 18 16 14   Temp: 98.1 F (36.7 C) 98.7 F (37.1 C) 98.6 F (37 C) 98.4 F (36.9 C)  TempSrc: Oral Oral Oral   SpO2: 100% 99% 100% 100%  Weight:      Height:         Body mass index is 36.84 kg/m.  Physical Exam   Constitutional: Appears well-developed and well-nourished.  Psych: Affect calm, cooperative  Eyes: No scleral injection.  HENT: No OP obstrucion.  Head: Normocephalic.  Cardiovascular: Normal rate and regular rhythm.  Respiratory: Effort normal, non-labored breathing GI: Soft.  No distension. There is no tenderness.  Skin: WDI.   Neurologic Examination   Neuro: Mental Status: Patient is awake, alert, oriented to person, place, month, year, and situation. Patient is able to give a clear and coherent history, including details of medications  No signs of aphasia or neglect Cranial Nerves: II: Visual Fields with left hemianopia. Pupils are equal, round, and reactive to light.   III,IV, VI: EOMI without ptosis or diploplia.  V: Facial sensation is symmetric to temperature VII: Facial movement is symmetric.  VIII: hearing is intact to voice X: Uvula elevates symmetrically XI: Shoulder shrug is symmetric. XII: tongue is midline without atrophy or fasciculations.  Motor: No pronator drift confirmed Dr Otelia Limes this morning: "BUE 4/5 proximally and distally with easy fatigability. BLE 3/5 bilateral hip flexion, 4/5 knee extension, knee flexion, ADF/APF without asymmetry." Sensory: Sensation is symmetric to  light touch and temperature in the arms and legs; reports perhaps some paraesthesias on the left arm, intermittent  Medications  Current Facility-Administered Medications:    0.9 %  sodium chloride infusion, 250 mL, Intravenous, PRN, Pokhrel, Laxman, MD   acetaminophen (TYLENOL) tablet 650 mg, 650 mg, Oral, Q6H PRN **OR** acetaminophen (TYLENOL) suppository 650 mg, 650 mg, Rectal, Q6H PRN, Pokhrel, Laxman, MD   clobetasol ointment (TEMOVATE) 0.05 % 1 Application, 1 Application, Topical, Daily PRN, Pokhrel, Laxman, MD   enoxaparin (LOVENOX) injection 40 mg, 40 mg, Subcutaneous, Q24H, Pokhrel, Laxman, MD, 40 mg at 02/23/23 2158   gabapentin (NEURONTIN) capsule 300 mg, 300 mg, Oral, TID PRN, Pokhrel, Laxman, MD   hydrALAZINE (APRESOLINE) injection 10 mg, 10 mg, Intravenous, Q6H PRN, Pokhrel, Laxman, MD, 10 mg at 02/23/23 1743   HYDROcodone-acetaminophen (NORCO/VICODIN) 5-325 MG per tablet 1-2 tablet, 1-2 tablet, Oral, Q4H PRN, Pokhrel, Laxman, MD, 1 tablet at 02/23/23 2013   hydroxychloroquine (PLAQUENIL) tablet 200 mg, 200 mg, Oral, BID, Pokhrel, Laxman, MD, 200 mg at 02/23/23 2159   [START ON 02/26/2023] ketoconazole (NIZORAL) 2 % shampoo 1 Application, 1 Application, Topical, Weekly, Pokhrel, Laxman, MD   ketorolac (TORADOL) 30 MG/ML injection 30 mg, 30 mg, Intravenous, Q6H PRN, Pokhrel, Laxman, MD   lactated ringers infusion, , Intravenous, Continuous, Pokhrel, Laxman, MD, Last Rate: 75 mL/hr at 02/24/23 0744, Infusion Verify at 02/24/23 0744   LORazepam (ATIVAN) injection 1 mg, 1 mg, Intravenous, PRN, Pokhrel, Laxman, MD, 1 mg at 02/23/23 1357   magnesium sulfate IVPB 4 g  100 mL, 4 g, Intravenous, Once, [place], Alvira Philips, DO, Last Rate: 50 mL/hr at 02/24/23 0840, 4 g at 02/24/23 0840   metoprolol succinate (TOPROL-XL) 24 hr tablet 25 mg, 25 mg, Oral, QHS, Pokhrel, Laxman, MD, 25 mg at 02/23/23 2013   ondansetron (ZOFRAN) tablet 4 mg, 4 mg, Oral, Q6H PRN **OR** ondansetron (ZOFRAN) injection 4 mg, 4  mg, Intravenous, Q6H PRN, Pokhrel, Laxman, MD   pantoprazole (PROTONIX) EC tablet 40 mg, 40 mg, Oral, Daily, Pokhrel, Laxman, MD   polyethylene glycol (MIRALAX / GLYCOLAX) packet 17 g, 17 g, Oral, Daily PRN, Pokhrel, Laxman, MD   potassium chloride (KLOR-CON M) CR tablet 30 mEq, 30 mEq, Oral, Q3H, [place], Alvira Philips, DO, 30 mEq at 02/24/23 0842   sodium chloride flush (NS) 0.9 % injection 3 mL, 3 mL, Intravenous, Q12H, Pokhrel, Laxman, MD, 3 mL at 02/23/23 2158   sodium chloride flush (NS) 0.9 % injection 3 mL, 3 mL, Intravenous, PRN, Pokhrel, Laxman, MD   zolpidem (AMBIEN) tablet 5 mg, 5 mg, Oral, QHS PRN, Pokhrel, Laxman, MD  Labs and Diagnostic Imaging   CBC:  Recent Labs  Lab 02/23/23 1100 02/24/23 0521  WBC 5.1 3.7*  NEUTROABS 4.3  --   HGB 10.0* 9.6*  HCT 32.1* 30.2*  MCV 86.5 85.8  PLT 287 260    Basic Metabolic Panel:  Lab Results  Component Value Date   NA 139 02/24/2023   K 3.3 (L) 02/24/2023   CO2 21 (L) 02/24/2023   GLUCOSE 92 02/24/2023   BUN 9 02/24/2023   CREATININE 0.89 02/24/2023   CALCIUM 8.0 (L) 02/24/2023   GFRNONAA >60 02/24/2023   GFRAA 127 09/29/2019   Lipid Panel:  Lab Results  Component Value Date   LDLCALC 59 08/15/2021   HgbA1c:  Lab Results  Component Value Date   HGBA1C 5.5 02/24/2023   Urine Drug Screen:     Component Value Date/Time   LABOPIA NONE DETECTED 11/05/2022 1240   COCAINSCRNUR NONE DETECTED 11/05/2022 1240   LABBENZ NONE DETECTED 11/05/2022 1240   AMPHETMU NONE DETECTED 11/05/2022 1240   THCU NONE DETECTED 11/05/2022 1240   LABBARB NONE DETECTED 11/05/2022 1240    Alcohol Level No results found for: "ETH" INR  Lab Results  Component Value Date   INR 1.2 06/13/2021   APTT  Lab Results  Component Value Date   APTT 32 06/13/2021   AED levels: No results found for: "PHENYTOIN", "ZONISAMIDE", "LAMOTRIGINE", "LEVETIRACETA"  CT Head without contrast(Personally reviewed):  Multifocal cerebral hypodensities, including  a 5 cm edematous area in the right parietal lobe. These are indeterminate with considerations including subacute ischemia, neoplasm, an infectious/inflammatory process, and tumefactive demyelination. A brain MRI without and with contrast is recommended for further evaluation.    CT angio Head and Neck with contrast(Personally reviewed): pending   MRI Brain(Personally reviewed): Multifocal regions of predominantly cortical diffusion restriction with associated contrast enhancement, involving the bilateral insular regions, right occipital lobe, and left cerebellum. Findings are nonspecific, but with predominantly cortical involvement, CNS vasculitis is a differential consideration. Given patient's history, an additional differential consideration is demyelinating disease. Recommend further evaluation with a CTA head and neck angiogram.  rEEG:  pending  Assessment   38 y.o. female with a past medical history of Cervical intraepithelial neoplasia III, Gallstones, GERD (gastroesophageal reflux disease), History of blood transfusion (2020), Lupus (dx 2020), Multiple sclerosis (dx 2020), and Numbness and tingling of left leg who presents with a 2 week history of diffuse weakness and  malaise in conjunction with headache, runny nose, nausea, congestion, and nose bleeds x1 week. She is seen for her MS by Dr. Epimenio Foot. She is on Plaquenil for her lupus. Also on azathioprine for her lupus which patient states resulted in a decision by Dr. Epimenio Foot not to add any additional disease modifying therapy for her MS. She has been transferred to [facility] for Neurological consultation and further workup.  - Exam reveals left homonymous hemianopsia and diffuse limb weakness without asymmetry. Pupils equally reactive. Positive Hoffman's sign on the right. The left homonymous hemianopsia corresponds to the subacute medium-sized partially enhancing lesion seen on MRI (see below) - Brain imaging: - CT head (personally  reviewed): Multifocal cerebral hypodensities, including a 5 cm edematous area in the right parietal lobe. These are indeterminate with considerations including subacute ischemia, neoplasm, an infectious/inflammatory process, and tumefactive demyelination.  - MRI brain (personally reviewed by Dr. Iver Nestle): Multifocal regions of predominantly cortical diffusion restriction with associated contrast enhancement, involving the bilateral insular regions, right occipital lobe, and left cerebellum. Findings are nonspecific, but with predominantly cortical involvement, CNS vasculitis is a differential consideration. Given patient's history, an additional differential consideration is demyelinating disease.  - Second personal review of MRI by Dr. Otelia Limes (Neurology): MRI reveals a medium sized subacute-appearing cortically based lesion with subjacent white matter involvement in the right occipital lobe that has patchy internal enhancement. Also noted is a lesion that is small and cortically based in the left anterior temporal lobe. Smaller cortically based right frontal lobe lesions also noted. On personal review of the images, there also appear to be bilateral subtle FLAIR-hyperintense cerebellar lesions. All of the above show some degree of diffusion restriction. Only the right occipital lobe lesion appears to enhance.  - Patient has history of both multiple sclerosis as well as lupus.  She is immunosuppressed with azathioprine and Plaquenil.  Therefore may not show typical stigmata of infection. - Does not appear toxic on exam, has no fever, no neck stiffness and no leukocytosis (WBC are now low). These factors would make infection less likely, but cerebritis is still a differential diagnostic consideration.  - Labs:  - Magnesium is low at 1.5. Potassium low at 3.3. Na normal. BUN and Cr are normal. Glucose 92. TSH 1.466.  - WBC 5.1 >> 3.7. Hgb 9.6.  - DDx for subacute lesions seen on MRI:  - Stroke secondary to  CNS vasculitis in the setting of known lupus - Tumefactive MS - Infections -- toxoplasmosis, less likely PML but will test for JCV - Given her history of cervical intraepithelial neoplasia, metastatic lesions are also on the Ddx; also consider lymphomas  Recommendations  - CTA head and neck angiogram pending - Given her very atypical MRI brain lesions we think lumbar puncture is critical to understand underlying process. Will need fluoro guided LP due to failed attempt in the ED.  - LP, low volume due to right parietal mass lesion (which is not obstructing) Protein 76, glucose 38 (~50% of serum 76),  Pending: cell counts and tubes 1 and 4, meningitis/encephalitis panel, IgG index, gram stain, fungal and bacterial cultures. CSF TB PCR  Additionally would consider JCV PCR (0.2-0.5 cc required), Toxoplasma PCR (0.2-0.5 cc required)  - Serum labs:  - Quantiferon gold testing - Toxoplasma testing - CD4 counts  - JC Virus  - Anti-MOG antibody - MRI C-spine and T-spine w/ and w/o to further clarify extent of disease process  - Will determine whether pulsed-dose steroid or other treatment, such as  antibiotics, based on workup above; continue azathioprine and plaquenil for now  - Contacted Dr Luciana Axe for ID consult, appreciate assistance - EEG due to intermittent vision changes and left sided parasthesias to evaluate for cortical irritability - PRN medications for headache.  - Neurology will follow along, discussed with ID and primary via secure chat and Dr. Epimenio Foot via phone ______________________________________________________________________   Brooke Dare MD-PhD [facility] [phone] Available 7 AM to 7 PM, outside these hours please contact Neurologist on call listed on AMION   No charge same day note

## 2023-02-24 NOTE — Plan of Care (Signed)
Alert and oriented. LP completed today. Bedrest completed.  Family at bedside.  Educated on plan of care.   Problem: Education: Goal: Knowledge of General Education information will improve Description: Including pain rating scale, medication(s)/side effects and non-pharmacologic comfort measures Outcome: Progressing   Problem: Health Behavior/Discharge Planning: Goal: Ability to manage health-related needs will improve Outcome: Progressing   Problem: Clinical Measurements: Goal: Ability to maintain clinical measurements within normal limits will improve Outcome: Progressing Goal: Will remain free from infection Outcome: Progressing Goal: Diagnostic test results will improve Outcome: Progressing Goal: Respiratory complications will improve Outcome: Progressing Goal: Cardiovascular complication will be avoided Outcome: Progressing   Problem: Activity: Goal: Risk for activity intolerance will decrease Outcome: Progressing   Problem: Nutrition: Goal: Adequate nutrition will be maintained Outcome: Progressing   Problem: Coping: Goal: Level of anxiety will decrease Outcome: Progressing   Problem: Elimination: Goal: Will not experience complications related to bowel motility Outcome: Progressing Goal: Will not experience complications related to urinary retention Outcome: Progressing   Problem: Pain Managment: Goal: General experience of comfort will improve and/or be controlled Outcome: Progressing   Problem: Safety: Goal: Ability to remain free from injury will improve Outcome: Progressing   Problem: Skin Integrity: Goal: Risk for impaired skin integrity will decrease Outcome: Progressing

## 2023-02-24 NOTE — Procedures (Signed)
Technically successful fluoro guided LP at L4-5 level with opening pressure of 25 cm H2O  6.5 cc of clear, colorless CSF sent to lab for analysis.  No immediate post procedural complication.  Please see imaging section of Epic for full dictation.  Bedrest with HOB flat x 2 hours, may sit up x 10 minutes to eat/drink. If positional headache occurs then return to bedrest with HOB flat for additional 4 hours.   Lynnette Caffey, PA-C

## 2023-02-24 NOTE — Progress Notes (Signed)
Transported off unit to [facility] for Lumbar Puncture

## 2023-02-24 NOTE — Consult Note (Signed)
[facility]       Reason for Consult: Brain lesions   Referring Physician: Dr. [place]  Principal Problem:   Multiple sclerosis [facility]) Active Problems:   Common migraine   Normocytic anemia   SLE (systemic lupus erythematosus related syndrome) (HCC)   Essential hypertension    azaTHIOprine  150 mg Oral Daily   cholecalciferol  1,000 Units Oral Daily   enoxaparin (LOVENOX) injection  40 mg Subcutaneous Q24H   hydroxychloroquine  200 mg Oral BID   [START ON 02/26/2023] ketoconazole  1 Application Topical Weekly   metoprolol succinate  25 mg Oral QHS   pantoprazole  40 mg Oral Daily   sodium chloride flush  3 mL Intravenous Q12H    Recommendations: Will monitor results  Assessment: No fever, no significant WBCs makes infection low on the differential.  No antibiotics indicated at this time.  From an ID standpoint, OK to start steroids as indicated.   HPI: Emily Hicks is a 38 y.o. female with a history of MS followed by Dr. Linden Dolin, SLE and came in to the ED with a severe headache that had been ongoing intermittently for about 2 weeks.  No real relief with OTC treatment.  No fever, no chills.  No visual changes. Had lumbar puncture and just 3-4 WBCs., normal glucose. Protein a bit elevated.  No organisms on gram stain.  Has not had any neck stiffness.  Meningitis/encephalitis panel negative.     Review of Systems:  Constitutional: negative for fevers and chills All other systems reviewed and are negative    Past Medical History:  Diagnosis Date   Cervical intraepithelial neoplasia III    Gallstones    GERD (gastroesophageal reflux disease)    only prn OTC occasionally   History of blood transfusion 2020   Lupus dx 2020   Multiple sclerosis (HCC) dx 2020   Numbness and tingling of left leg     Social History   Tobacco Use   Smoking status: Never   Smokeless tobacco: Never  Vaping Use   Vaping status: Never Used  Substance Use  Topics   Alcohol use: No   Drug use: No    Family History  Problem Relation Age of Onset   Hypertension Father    Hypertension Mother    Hypertension Sister    Diabetes Maternal Grandmother    Diabetes Maternal Grandfather     No Known Allergies  Physical Exam: Constitutional: in no apparent distress  Vitals:   02/24/23 1342 02/24/23 1600  BP: (!) 163/110 (!) 160/110  Pulse:  96  Resp:  14  Temp:  99.7 F (37.6 C)  SpO2:  98%   EYES: anicteric Respiratory: normal respiratory effort GI: soft MS: no neck stiffness  Lab Results  Component Value Date   WBC 3.7 (L) 02/24/2023   HGB 9.6 (L) 02/24/2023   HCT 30.2 (L) 02/24/2023   MCV 85.8 02/24/2023   PLT 260 02/24/2023    Lab Results  Component Value Date   CREATININE 0.89 02/24/2023   BUN 9 02/24/2023   NA 139 02/24/2023   K 3.3 (L) 02/24/2023   CL 110 02/24/2023   CO2 21 (L) 02/24/2023    Lab Results  Component Value Date   ALT 5 02/24/2023   AST 16 02/24/2023   ALKPHOS 39 02/24/2023     Microbiology: Recent Results (from the past 240 hours)  Resp panel by RT-PCR (RSV, Flu A&B, Covid) Anterior Nasal Swab  Status: None   Collection Time: 02/16/23  6:31 PM   Specimen: Anterior Nasal Swab  Result Value Ref Range Status   SARS Coronavirus 2 by RT PCR NEGATIVE NEGATIVE Final    Comment: (NOTE) SARS-CoV-2 target nucleic acids are NOT DETECTED.  The SARS-CoV-2 RNA is generally detectable in upper respiratory specimens during the acute phase of infection. The lowest concentration of SARS-CoV-2 viral copies this assay can detect is 138 copies/mL. A negative result does not preclude SARS-Cov-2 infection and should not be used as the sole basis for treatment or other patient management decisions. A negative result may occur with  improper specimen collection/handling, submission of specimen other than nasopharyngeal swab, presence of viral mutation(s) within the areas targeted by this assay, and  inadequate number of viral copies(<138 copies/mL). A negative result must be combined with clinical observations, patient history, and epidemiological information. The expected result is Negative.  Fact Sheet for Patients:  BloggerCourse.com  Fact Sheet for Healthcare Providers:  SeriousBroker.it  This test is no t yet approved or cleared by the [place] FDA and  has been authorized for detection and/or diagnosis of SARS-CoV-2 by FDA under an Emergency Use Authorization (EUA). This EUA will remain  in effect (meaning this test can be used) for the duration of the COVID-19 declaration under Section 564(b)(1) of the Act, 21 U.S.C.section 360bbb-3(b)(1), unless the authorization is terminated  or revoked sooner.       Influenza A by PCR NEGATIVE NEGATIVE Final   Influenza B by PCR NEGATIVE NEGATIVE Final    Comment: (NOTE) The Xpert Xpress SARS-CoV-2/FLU/RSV plus assay is intended as an aid in the diagnosis of influenza from Nasopharyngeal swab specimens and should not be used as a sole basis for treatment. Nasal washings and aspirates are unacceptable for Xpert Xpress SARS-CoV-2/FLU/RSV testing.  Fact Sheet for Patients: BloggerCourse.com  Fact Sheet for Healthcare Providers: SeriousBroker.it  This test is not yet approved or cleared by the [place] FDA and has been authorized for detection and/or diagnosis of SARS-CoV-2 by FDA under an Emergency Use Authorization (EUA). This EUA will remain in effect (meaning this test can be used) for the duration of the COVID-19 declaration under Section 564(b)(1) of the Act, 21 U.S.C. section 360bbb-3(b)(1), unless the authorization is terminated or revoked.     Resp Syncytial Virus by PCR NEGATIVE NEGATIVE Final    Comment: (NOTE) Fact Sheet for Patients: BloggerCourse.com  Fact Sheet for Healthcare  Providers: SeriousBroker.it  This test is not yet approved or cleared by the [place] FDA and has been authorized for detection and/or diagnosis of SARS-CoV-2 by FDA under an Emergency Use Authorization (EUA). This EUA will remain in effect (meaning this test can be used) for the duration of the COVID-19 declaration under Section 564(b)(1) of the Act, 21 U.S.C. section 360bbb-3(b)(1), unless the authorization is terminated or revoked.  Performed at [facility], [address], [place], [place] [address]   CSF culture w Gram Stain     Status: None (Preliminary result)   Collection Time: 02/24/23 12:08 PM   Specimen: PATH Cytology CSF; Cerebrospinal Fluid  Result Value Ref Range Status   Specimen Description CSF  Final   Special Requests NONE  Final   Gram Stain   Final    WBC PRESENT, PREDOMINANTLY MONONUCLEAR NO ORGANISMS SEEN CYTOSPIN SMEAR Performed at [facility], [address] [place], [place] [address]    Culture PENDING  Incomplete   Report Status PENDING  Incomplete  Gardiner Barefoot, MD [facility] [email or web address] 02/24/2023, 4:25 PM

## 2023-02-24 NOTE — Procedures (Signed)
Patient Name: SHATINA ROHLER  MRN: [id number]  Epilepsy Attending: Charlsie Quest  Referring Physician/Provider: Gordy Councilman, MD  Date: 02/24/2023 Duration: 22.36 mins  Patient history: 38yo F with MS, intermittent visual disturbance on left. EEG to evaluate for seizure  Level of alertness: Awake  AEDs during EEG study: None  Technical aspects: This EEG study was done with scalp electrodes positioned according to the 10-20 International system of electrode placement. Electrical activity was reviewed with band pass filter of 1-70Hz , sensitivity of 7 uV/mm, display speed of 37mm/sec with a 60Hz  notched filter applied as appropriate. EEG data were recorded continuously and digitally stored.  Video monitoring was available and reviewed as appropriate.  Description: EEG showed continuous generalized and maximal right parieto-occipital sharply contoured 3 to 6 Hz theta-delta slowing. Hyperventilation and photic stimulation were not performed.     ABNORMALITY - Continuous slow, generalized and maximal right parieto-occipital  IMPRESSION: This study is suggestive of cortical dysfunction arising from right parieto-occipital likely secondary to underlying structural abnormality. Additionally there is moderate diffuse encephalopathy. No seizures or definite epileptiform discharges were seen throughout the recording.  Alam Guterrez Annabelle Harman

## 2023-02-24 NOTE — Progress Notes (Signed)
Arrived back to unit from [facility] after Lumbar Puncture.

## 2023-02-24 NOTE — Progress Notes (Addendum)
EEG complete - results pending.  GMD/NW 

## 2023-02-24 NOTE — Consult Note (Signed)
NEUROLOGY CONSULT NOTE   Date of service: February 24, 2023 Patient Name: Emily Hicks MRN:  [id number] DOB:  April 29, 1985 Chief Complaint: Headache, visual blurring, diffuse weakness and malaise for the past 2 weeks.  Requesting Provider: Joycelyn Das, MD  History of Present Illness  Emily Hicks is a 38 y.o. female with a past medical history of Cervical intraepithelial neoplasia III, Gallstones, GERD (gastroesophageal reflux disease), History of blood transfusion (2020), Lupus (dx 2020), Multiple sclerosis (dx 2020), and Numbness and tingling of left leg who presents with a 2 week history of diffuse weakness and malaise in conjunction with headache, runny nose, nausea, congestion, and nose bleeds x1 week. She is seen for her MS by Dr. Epimenio Foot. She is on Plaquenil for her lupus. Also on azathioprine for her lupus which patient states resulted in a decision by Dr. Epimenio Foot not to add any additional disease modifying therapy for her MS.  MRI revealed a medium sized subacute-appearing cortically based lesion with subjacent white matter involvement in the right occipital lobe that has patchy internal enhancement. Also noted was a lesion that is small and cortically based in the left anterior temporal lobe. Smaller cortically based right frontal lobe lesions also noted. On personal review of the images, there also appear to be bilateral subtle FLAIR-hyperintense cerebellar lesions.   She has been transferred to [facility] for Neurological consultation and further workup.   Additional history obtained by EDP has been reviewed: "38 year old female with past medical history significant for lupus, multiple sclerosis presents today for concern of headache. She states this has been ongoing now for couple weeks and is associated with fatigue, nausea, vomiting. She has taken Excedrin without relief. Does have history of headaches similar to this. She is also concerned her blood pressure is elevated. She states typically  Excedrin would help. She states she gets various symptoms and presentations with her lupus flareup so she is unsure if this is part of that. Denies visual change. Apparently she endorsed blurred vision but to me she currently denies any vision change."  Denies SOB, presyncope, CP, fever or chills.   ROS  Comprehensive ROS performed and pertinent positives documented in HPI. She does not endorse any lateralized vision loss, but does complain of visual blurring for about 2 weeks.    Past History   Past Medical History:  Diagnosis Date   Cervical intraepithelial neoplasia III    Gallstones    GERD (gastroesophageal reflux disease)    only prn OTC occasionally   History of blood transfusion 2020   Lupus dx 2020   Multiple sclerosis (HCC) dx 2020   Numbness and tingling of left leg     Past Surgical History:  Procedure Laterality Date   CERVICAL CONIZATION W/BX N/A 01/16/2020   Procedure: CERVICAL CONE BIOPSY, ENDOCERVICAL CURETTAGE;  Surgeon: Theresia Majors, MD;  Location: [facility];  Service: Gynecology;  Laterality: N/A;  request 7:30am OR start in [place] [facility]yn block requests 45 minutes   CHOLECYSTECTOMY N/A 12/28/2013   Procedure: LAPAROSCOPIC CHOLECYSTECTOMY;  Surgeon: Claud Kelp, MD;  Location: [facility];  Service: General;  Laterality: N/A;   MUSCLE BIOPSY Right 11/06/2022   Procedure: RIGHT THIGH MUSCLE BIOPSY;  Surgeon: Andria Meuse, MD;  Location: [facility] OR;  Service: General;  Laterality: Right;    Family History: Family History  Problem Relation Age of Onset   Hypertension Father    Hypertension Mother    Hypertension Sister    Diabetes Maternal Grandmother  Diabetes Maternal Grandfather     Social History  reports that she has never smoked. She has never used smokeless tobacco. She reports that she does not drink alcohol and does not use drugs.  No Known Allergies  Medications   Current Facility-Administered Medications:    0.9 %   sodium chloride infusion, 250 mL, Intravenous, PRN, Pokhrel, Laxman, MD   acetaminophen (TYLENOL) tablet 650 mg, 650 mg, Oral, Q6H PRN **OR** acetaminophen (TYLENOL) suppository 650 mg, 650 mg, Rectal, Q6H PRN, Pokhrel, Laxman, MD   clobetasol ointment (TEMOVATE) 0.05 % 1 Application, 1 Application, Topical, Daily PRN, Pokhrel, Laxman, MD   enoxaparin (LOVENOX) injection 40 mg, 40 mg, Subcutaneous, Q24H, Pokhrel, Laxman, MD, 40 mg at 02/23/23 2158   gabapentin (NEURONTIN) capsule 300 mg, 300 mg, Oral, TID PRN, Pokhrel, Laxman, MD   hydrALAZINE (APRESOLINE) injection 10 mg, 10 mg, Intravenous, Q6H PRN, Pokhrel, Laxman, MD, 10 mg at 02/23/23 1743   HYDROcodone-acetaminophen (NORCO/VICODIN) 5-325 MG per tablet 1-2 tablet, 1-2 tablet, Oral, Q4H PRN, Pokhrel, Laxman, MD, 1 tablet at 02/23/23 2013   hydroxychloroquine (PLAQUENIL) tablet 200 mg, 200 mg, Oral, BID, Pokhrel, Laxman, MD, 200 mg at 02/23/23 2159   [START ON 02/26/2023] ketoconazole (NIZORAL) 2 % shampoo 1 Application, 1 Application, Topical, Weekly, Pokhrel, Laxman, MD   ketorolac (TORADOL) 30 MG/ML injection 30 mg, 30 mg, Intravenous, Q6H PRN, Pokhrel, Laxman, MD   lactated ringers infusion, , Intravenous, Continuous, Pokhrel, Laxman, MD, Last Rate: 75 mL/hr at 02/23/23 2105, New Bag at 02/23/23 2105   LORazepam (ATIVAN) injection 1 mg, 1 mg, Intravenous, PRN, Pokhrel, Laxman, MD, 1 mg at 02/23/23 1357   metoprolol succinate (TOPROL-XL) 24 hr tablet 25 mg, 25 mg, Oral, QHS, Pokhrel, Laxman, MD, 25 mg at 02/23/23 2013   ondansetron (ZOFRAN) tablet 4 mg, 4 mg, Oral, Q6H PRN **OR** ondansetron (ZOFRAN) injection 4 mg, 4 mg, Intravenous, Q6H PRN, Pokhrel, Laxman, MD   pantoprazole (PROTONIX) EC tablet 40 mg, 40 mg, Oral, Daily, Pokhrel, Laxman, MD   polyethylene glycol (MIRALAX / GLYCOLAX) packet 17 g, 17 g, Oral, Daily PRN, Pokhrel, Laxman, MD   sodium chloride flush (NS) 0.9 % injection 3 mL, 3 mL, Intravenous, Q12H, Pokhrel, Laxman, MD, 3 mL at  02/23/23 2158   sodium chloride flush (NS) 0.9 % injection 3 mL, 3 mL, Intravenous, PRN, Pokhrel, Laxman, MD   zolpidem (AMBIEN) tablet 5 mg, 5 mg, Oral, QHS PRN, Pokhrel, Laxman, MD  Vitals   Vitals:   02/23/23 2013 02/23/23 2053 02/23/23 2341 02/24/23 0330  BP: (!) 163/97 (!) 161/104 (!) 142/90 (!) 149/107  Pulse: 100 (!) 104 98 97  Resp:  18 18 16   Temp:  98.1 F (36.7 C) 98.7 F (37.1 C) 98.6 F (37 C)  TempSrc:  Oral Oral Oral  SpO2:  100% 99% 100%  Weight:      Height:        Body mass index is 36.84 kg/m.  Physical Exam   Physical Exam  HEENT-  [place]/AT    Lungs- Respirations unlabored Extremities- No edema  Neurological Examination Mental Status: Alert, oriented x 5, thought content appropriate.  Speech fluent without evidence of aphasia.  Able to follow all commands without difficulty. Dysthymic affect in the context of her malaise.  Cranial Nerves: II: Left homonymous hemianopsia. PERRL  III,IV, VI: No ptosis. EOMI.  V: Temp sensation equal bilaterally  VII: Smile symmetric VIII: Hearing intact to voice IX,X: No hoarseness XI: Symmetric XII: Midline tongue extension Motor: BUE 4/5  proximally and distally with easy fatigability. Tone normal.  BLE 3/5 bilateral hip flexion, 4/5 knee extension, knee flexion, ADF/APF without asymmetry. Tone normal.  No pronator drift.  Sensory: Temp and light touch intact throughout, bilaterally. No extinction to DSS.  Deep Tendon Reflexes: 2+ and symmetric bilateral brachioradialis, biceps, patellae and achilles reflexes. Positive Hoffman's sign on the right.  Cerebellar: No ataxia with FNF bilaterally  Gait: Deferred   Labs/Imaging/Neurodiagnostic studies   CBC:  Recent Labs  Lab 03/14/2023 1100 02/24/23 0521  WBC 5.1 3.7*  NEUTROABS 4.3  --   HGB 10.0* 9.6*  HCT 32.1* 30.2*  MCV 86.5 85.8  PLT 287 260   Basic Metabolic Panel:  Lab Results  Component Value Date   NA 139 02/24/2023   K 3.3 (L) 02/24/2023   CO2  21 (L) 02/24/2023   GLUCOSE 92 02/24/2023   BUN 9 02/24/2023   CREATININE 0.89 02/24/2023   CALCIUM 8.0 (L) 02/24/2023   GFRNONAA >60 02/24/2023   GFRAA 127 09/29/2019   Lipid Panel:  Lab Results  Component Value Date   LDLCALC 59 08/15/2021   HgbA1c:  Lab Results  Component Value Date   HGBA1C 5.4 06/16/2022   Urine Drug Screen:     Component Value Date/Time   LABOPIA NONE DETECTED 11/05/2022 1240   COCAINSCRNUR NONE DETECTED 11/05/2022 1240   LABBENZ NONE DETECTED 11/05/2022 1240   AMPHETMU NONE DETECTED 11/05/2022 1240   THCU NONE DETECTED 11/05/2022 1240   LABBARB NONE DETECTED 11/05/2022 1240    Alcohol Level No results found for: "ETH" INR  Lab Results  Component Value Date   INR 1.2 06/13/2021   APTT  Lab Results  Component Value Date   APTT 32 06/13/2021    ASSESSMENT  38 y.o. female with a past medical history of Cervical intraepithelial neoplasia III, Gallstones, GERD (gastroesophageal reflux disease), History of blood transfusion (2020), Lupus (dx 2020), Multiple sclerosis (dx 2020), and Numbness and tingling of left leg who presents with a 2 week history of diffuse weakness and malaise in conjunction with headache, runny nose, nausea, congestion, and nose bleeds x1 week. She is seen for her MS by Dr. Epimenio Foot. She is on Plaquenil for her lupus. Also on azathioprine for her lupus which patient states resulted in a decision by Dr. Epimenio Foot not to add any additional disease modifying therapy for her MS. She has been transferred to [facility] for Neurological consultation and further workup.  - Exam reveals left homonymous hemianopsia and diffuse limb weakness without asymmetry. Pupils equally reactive. Positive Hoffman's sign on the right. The left homonymous hemianopsia corresponds to the subacute medium-sized partially enhancing lesion seen on MRI (see below) - Brain imaging: - CT head (personally reviewed): Multifocal cerebral hypodensities, including a 5 cm edematous area  in the right parietal lobe. These are indeterminate with considerations including subacute ischemia, neoplasm, an infectious/inflammatory process, and tumefactive demyelination.  - MRI brain (personally reviewed by Dr. Iver Nestle): Multifocal regions of predominantly cortical diffusion restriction with associated contrast enhancement, involving the bilateral insular regions, right occipital lobe, and left cerebellum. Findings are nonspecific, but with predominantly cortical involvement, CNS vasculitis is a differential consideration. Given patient's history, an additional differential consideration is demyelinating disease.  - Second personal review of MRI by Dr. Otelia Limes (Neurology): MRI reveals a medium sized subacute-appearing cortically based lesion with subjacent white matter involvement in the right occipital lobe that has patchy internal enhancement. Also noted is a lesion that is small and cortically based in the left  anterior temporal lobe. Smaller cortically based right frontal lobe lesions also noted. On personal review of the images, there also appear to be bilateral subtle FLAIR-hyperintense cerebellar lesions. All of the above show some degree of diffusion restriction. Only the right occipital lobe lesion appears to enhance.  - Patient has history of both multiple sclerosis as well as lupus.  She is immunosuppressed with azathioprine and Plaquenil.  Therefore may not show typical stigmata of infection. - Does not appear toxic on exam, has no fever, no neck stiffness and no leukocytosis (WBC are now low). These factors would make infection less likely, but cerebritis is still a differential diagnostic consideration.  - Labs:  - Magnesium is low at 1.5. Potassium low at 3.3. Na normal. BUN and Cr are normal. Glucose 92. TSH 1.466.  - WBC 5.1 >> 3.7. Hgb 9.6.  - DDx for subacute lesions seen on MRI:  - Stroke secondary to CNS vasculitis in the setting of known lupus - Lupus cerebritis - Tumefactive  MS - Infection is also possible, but lower on the DDx based on overall clinical presentation and MRI appearance - Given her history of cervical intraepithelial neoplasia, metastatic lesions are also on the DDx.    RECOMMENDATIONS  - Recommend further evaluation with a CTA head and neck angiogram. - Given her very atypical MRI brain lesions we think lumbar puncture is critical to understand underlying process. Will need fluoro guided LP due to failed attempt in the ED.  - LP should be low volume due to right parietal mass lesion which is not obstructing. Will need the following labs on CSF: Protein, glucose, cell counts and tubes 1 and 4, meningitis/encephalitis panel no more than 1 to 2 cc of fluid per tube, IgG index, gram stain, fungal and bacterial cultures. CSF TB PCR (listed under nucleic acid assay for TB in Epic labs menu). Any unused CSF should be reserved by laboratory for potential additional testing based on preliminary results - Quantiferon gold testing - Please obtain point-of-care glucose at time of successful completion of lumbar puncture to correlate with CSF findings - Will determine whether pulsed-dose steroid or other treatment, such as antibiotics, are indicated based on LP results.  - Will need ID consult (ordered, but will need to be called in by Neurology Team in the AM.  - Continue azathioprine and plaquenil for now. Will need to discontinue if CSF is most consistent with infection.  - PRN medications for headache.  ______________________________________________________________________    Dessa Phi, Jolene Guyett, MD [facility]

## 2023-02-24 NOTE — Progress Notes (Addendum)
PT Cancellation Note  Patient Details Name: Emily Hicks MRN: [id number] DOB: Nov 29, 1985   Cancelled Treatment:    Reason Eval/Treat Not Completed: Patient not medically ready  0845  Noted BP 156/105 with parameters set for <160/100. Also noted multiple tests pending today. Will continue attempts as appropriate.   1400 BP 163/110 in supine. PT evaluation deferred and RN made aware.    Jerolyn Center, PT Acute Rehabilitation Services  Office [phone]  Zena Amos 02/24/2023, 8:45 AM

## 2023-02-24 NOTE — Progress Notes (Signed)
OT Cancellation Note  Patient Details Name: ENAJAH FARIN MRN: [id number] DOB: 08/06/1985   Cancelled Treatment:    Reason Eval/Treat Not Completed: Medical issues which prohibited therapy (Elevated BP; OT to follow up when appropriate.)  Donia Pounds 02/24/2023, 2:12 PM

## 2023-02-25 ENCOUNTER — Inpatient Hospital Stay (HOSPITAL_COMMUNITY): Payer: 59

## 2023-02-25 DIAGNOSIS — G35 Multiple sclerosis: Secondary | ICD-10-CM | POA: Diagnosis not present

## 2023-02-25 LAB — CYTOLOGY - NON PAP

## 2023-02-25 LAB — BASIC METABOLIC PANEL
Anion gap: 7 (ref 5–15)
BUN: 8 mg/dL (ref 6–20)
CO2: 20 mmol/L — ABNORMAL LOW (ref 22–32)
Calcium: 8.2 mg/dL — ABNORMAL LOW (ref 8.9–10.3)
Chloride: 114 mmol/L — ABNORMAL HIGH (ref 98–111)
Creatinine, Ser: 0.82 mg/dL (ref 0.44–1.00)
GFR, Estimated: >60 mL/min (ref >60)
Glucose, Bld: 101 mg/dL — ABNORMAL HIGH (ref 70–99)
Potassium: 3.8 mmol/L (ref 3.5–5.1)
Sodium: 141 mmol/L (ref 135–145)

## 2023-02-25 LAB — CD4/CD8 (T-HELPER/T-SUPPRESSOR CELL)
CD4 absolute: 390 /uL — ABNORMAL LOW (ref 400–1790)
CD4%: 44.64 % (ref 33–65)
CD8 T Cell Abs: 386 /uL (ref 190–1000)
CD8tox: 44.16 % — ABNORMAL HIGH (ref 12–40)
Ratio: 1.01 (ref 1.0–3.0)
Total lymphocyte count: 874 /uL — ABNORMAL LOW (ref 1000–4000)

## 2023-02-25 LAB — HIV ANTIBODY (ROUTINE TESTING W REFLEX): HIV Screen 4th Generation wRfx: NONREACTIVE

## 2023-02-25 LAB — TOXOPLASMA ANTIBODIES- IGG AND  IGM
Toxoplasma Antibody- IgM: 22.2 [AU]/ml — ABNORMAL HIGH (ref 0.0–7.9)
Toxoplasma IgG Ratio: 81.2 [IU]/mL — ABNORMAL HIGH (ref 0.0–7.1)

## 2023-02-25 LAB — RPR: RPR Ser Ql: NONREACTIVE

## 2023-02-25 LAB — MAGNESIUM: Magnesium: 1.9 mg/dL (ref 1.7–2.4)

## 2023-02-25 MED ORDER — IOHEXOL 9 MG/ML PO SOLN
500.0000 mL | ORAL | Status: AC
  Administered 2023-02-25: 500 mL via ORAL

## 2023-02-25 MED ORDER — IOHEXOL 350 MG/ML SOLN
75.0000 mL | Freq: Once | INTRAVENOUS | Status: AC | PRN
  Administered 2023-02-25: 75 mL via INTRAVENOUS

## 2023-02-25 MED ORDER — LACTATED RINGERS IV SOLN: INTRAVENOUS | Status: AC

## 2023-02-25 NOTE — Plan of Care (Signed)
  Problem: Education: Goal: Knowledge of General Education information will improve Description Including pain rating scale, medication(s)/side effects and non-pharmacologic comfort measures Outcome: Progressing   Problem: Health Behavior/Discharge Planning: Goal: Ability to manage health-related needs will improve Outcome: Progressing   

## 2023-02-25 NOTE — Progress Notes (Signed)
[facility] night cross cover note:   I was notified by RN of the patient's family's concern regarding dehydration.  RN conveys that the patient has had poor appetite and poor oral intake recently, with the family conveying that she has consumed 1-2 cups of water over the last 24 hours and a total.  Family is specifically wondering if the patient is eligible to receive IV fluids overnight.  I subsequently ordered lactated Ringer's at 75 cc/h x 12 hours.    Newton Pigg, DO Hospitalist

## 2023-02-25 NOTE — Progress Notes (Signed)
Brief ID note:  CSF results reviewed.  No new findings.  Serology noted with mildly elevated IgM toxoplasma but also IgG positive.  I doubt the significance of this but I have added on toxoplasma PCR in the CSF.  MRI findings not consistent with toxoplasma.  Particular findings of infection at this time and looks to be low on the differential. Will continue to follow results.  Gardiner Barefoot, MD

## 2023-02-25 NOTE — Progress Notes (Signed)
PROGRESS NOTE    Emily Hicks  UEA:[id number] DOB: Oct 13, 1985 DOA: 02/23/2023 PCP: Patient, No Pcp Per    Brief Narrative:   Emily Hicks is a 38 y.o. female with past medical history significant for multiple sclerosis, lupus, GERD, cervical intraepithelial neoplasia, cholelithiasis who presented to [facility] ED on 1/28 with 2-week history of progressive generalized weakness, malaise, headache.  Also endorsed nasal congestion, nausea, nosebleeds.    Currently on Plaquenil and azathioprine for her lupus. Followed by neurology outpatient, Dr. Epimenio Foot but currently not on any additional disease modifying therapy for her MS currently.  Sister also endorses that she has been having a hard time "gripping" items.  She has been taking Excedrin without much relief regarding her headaches.  Denies any urinary symptoms, no visual changes, no fever, no chills, no rigors, no muscle aches.  In the ED, temperature 97.9 F, HR 107, RR 18, BP 175/119, SpO2 99% on room air.  WBC 5.1, hemoglobin 10.0, platelet count 287.  Sodium 142, potassium 3.8, chloride 112, CO2 22, glucose 90, BUN 12, creatinine 0.92.  Hemoglobin A1c 5.5.  hCG negative.  CT head without contrast with multifocal cerebral hypodensities including 5 cm edematous area right parietal lobe, indeterminate with considerations including subacute ischemic, neoplasm, infectious/inflammatory process, tumefactive demyelination.  Neurology was consulted and recommended transfer to [facility].  TRH was consulted for admission and patient was transferred to [facility].  Assessment & Plan:   Brain lesions  Patient presenting with headache, progressive generalized weakness over the last few weeks associated with malaise.  Currently immunosuppressed from her lupus on azathioprine and Plaquenil.  CT head without contrast on admission with multifocal cerebral hypodensities including 5 cm edematous area right parietal lobe.  MRI brain with and  without contrast with multifocal regions of cortical diffusion restriction associated with contrast-enhancement bilateral insular regions, right occipital lobe, left cerebellum.  Imaging findings concerning for inflammatory vs CNS vasculitis vs CVA vs lupus cerebritis vs tumefactive MS vs infection.  CT angiogram head/neck with no large vessel occlusion, no stenosis or other acute vascular abnormality, no evidence for vasculitis, multiple prominent cervical, axillary, mediastinal lymph nodes, few scattered pulmonary nodules.  MR T/L-spine with T2 hyperintense foci cervical spine cord C4-5, C6/7 consistent with known MS, no abnormal enhancement to suggest active demyelination, no abnormal spinal cord signal or enhancement in the thoracic spine, epidural fluid collection thoracic spine likely from recent LP.  Underwent lumbar puncture 02/24/2023 by IR with initial fluid analysis not consistent with infection; meningitis/encephalitis panel negative.  EEG with generalized and maximal right parieto-occipital slowing suggestive of cortical dysfunction likely secondary to underlying structural normality, no seizure/epileptiform discharges noted. -- Neurology and infectious disease following, appreciate assistance -- Toxoplasma Ab elevated> IgM 22.2, IgG 81.2; per ID not concerning for active infection given appearance of brain lesions on MRI -- RPR nonreactive -- CSF toxoplasma PCR: Pending -- CSF cytology: No malignant cells -- CSF oligoclonal bands: Pending -- CSF IgG: Pending -- CSF culture no organism on Gram stain; no growth less than 24 hours -- CSF fungal culture: Pending -- CSF JC virus DNA PCR: Pending -- HIV antibody: Pending -- anti-MOG testing: pending -- EBV, CMV IgM, Bartonella antibody, CK, aldolase, LDH, ESR, CRP, C3/4 complement: Pending  -- CT chest/abdomen/pelvis with contrast: Pending for malignancy workup -- Declined axillary biopsy today  Pulmonary nodules Scattered pulmonary nodules  noted incidentally on CT angiogram head/neck. -- Further workup with CT chest as above  Lymphadenopathy CT angiogram  head/neck with findings of multiple prominent cervical, axillary, mediastinal lymph nodes. -- Decline axillary biopsy today -- Further malignancy workup with CT chest/abdomen/pelvis with contrast: Pending  Hypokalemia Hypomagnesemia Repleted. -- Repeat electrolytes in a.m.  History of multiple sclerosis, lupus Follows with neurology outpatient, Dr. Epimenio Foot -- Continue Plaquenil 200mg  PO BID, azathioprine   Essential hypertension -- Metoprolol succinate 25 mg p.o. daily -- Hydralazine 10 mg p.o. q6h PRN SBP >165  Vitamin D deficiency Vitamin D 25-hydroxy level 24.72.  Currently not taking ergocalciferol as reported at home. -- Cholecalciferol 1000 units p.o. daily  GERD -- Protonix 40 mg p.o. daily  Obesity, class III Body mass index is 36.84 kg/m.  Complicates all facets of care   DVT prophylaxis: enoxaparin (LOVENOX) injection 40 mg Start: 02/23/23 2200    Code Status: Full Code Family Communication: Updated family present at bedside this morning  Disposition Plan:  Level of care: Med-Surg Status is: Inpatient Remains inpatient appropriate because: Needs further workup regarding brain lesions with multiple labs, further imaging with CT chest/abdomen/pelvis, further per neurology/ID    Consultants:  Neurology Infectious disease  Procedures:  Lumbar puncture:  EEG  Antimicrobials:  None   Subjective: Patient seen examined bedside, resting calmly.  Lying in bed.  Family present at bedside.  Continues with generalized weakness, but frontal headache much improved.  Discussed with patient and sister at bedside regarding lymphadenopathy and recommendation of biopsy, declining at the moment.  Neurology further working up with additional labs today as well as CT chest/abdomen/pelvis for malignancy screening.  No other specific questions or concerns at  this time.  Denies visual changes, no chest pain, no shortness of breath, no abdominal pain, no fever/chills/night sweats, no current nausea, no vomiting/diarrhea, no cough/congestion, no paresthesias.  Objective: Vitals:   02/24/23 2333 02/25/23 0333 02/25/23 0833 02/25/23 1218  BP: (!) 138/91 (!) 149/100 (!) 151/110 (!) 156/111  Pulse: (!) 108 (!) 106 (!) 110 (!) 114  Resp: 18 18 18 18   Temp: 97.7 F (36.5 C) 99.8 F (37.7 C) 98.7 F (37.1 C) 98.8 F (37.1 C)  TempSrc: Oral Oral Oral Oral  SpO2: 100% 100% 100% 100%  Weight:      Height:        Intake/Output Summary (Last 24 hours) at 02/25/2023 1443 Last data filed at 02/25/2023 0900 Gross per 24 hour  Intake 637.35 ml  Output --  Net 637.35 ml   Filed Weights   02/23/23 1004  Weight: 88.5 kg    Examination:  Physical Exam: GEN: NAD, alert and oriented x 3, obese HEENT: NCAT, PERRL, EOMI, sclera clear, MMM PULM: CTAB w/o wheezes/crackles, normal respiratory effort, on room air CV: RRR w/o M/G/R GI: abd soft, NTND, NABS, no R/G/M MSK: no peripheral edema, moves all extremities independently NEURO: CN II-XII intact, no focal deficits, sensation to light touch intact PSYCH: normal mood/affect Integumentary: dry/intact, no rashes or wounds    Data Reviewed: I have personally reviewed following labs and imaging studies  CBC: Recent Labs  Lab 02/23/23 1100 02/24/23 0521  WBC 5.1 3.7*  NEUTROABS 4.3  --   HGB 10.0* 9.6*  HCT 32.1* 30.2*  MCV 86.5 85.8  PLT 287 260   Basic Metabolic Panel: Recent Labs  Lab 02/23/23 1100 02/24/23 0521 02/25/23 0549  NA 142 139 141  K 3.8 3.3* 3.8  CL 112* 110 114*  CO2 22 21* 20*  GLUCOSE 90 92 101*  BUN 12 9 8   CREATININE 0.92 0.89 0.82  CALCIUM  8.4* 8.0* 8.2*  MG  --  1.5* 1.9  PHOS  --  2.7  --    GFR: Estimated Creatinine Clearance: 96 mL/min (by C-G formula based on SCr of 0.82 mg/dL). Liver Function Tests: Recent Labs  Lab 02/24/23 0521  AST 16  ALT 5   ALKPHOS 39  BILITOT 0.6  PROT 6.1*  ALBUMIN 2.5*   No results for input(s): "LIPASE", "AMYLASE" in the last 168 hours. No results for input(s): "AMMONIA" in the last 168 hours. Coagulation Profile: No results for input(s): "INR", "PROTIME" in the last 168 hours. Cardiac Enzymes: No results for input(s): "CKTOTAL", "CKMB", "CKMBINDEX", "TROPONINI" in the last 168 hours. BNP (last 3 results) No results for input(s): "PROBNP" in the last 8760 hours. HbA1C: Recent Labs    02/23/23 1100 02/24/23 0521  HGBA1C 5.5 5.5   CBG: Recent Labs  Lab 02/24/23 1222  GLUCAP 76   Lipid Profile: No results for input(s): "CHOL", "HDL", "LDLCALC", "TRIG", "CHOLHDL", "LDLDIRECT" in the last 72 hours. Thyroid Function Tests: Recent Labs    02/24/23 0521  TSH 1.466   Anemia Panel: No results for input(s): "VITAMINB12", "FOLATE", "FERRITIN", "TIBC", "IRON", "RETICCTPCT" in the last 72 hours. Sepsis Labs: No results for input(s): "PROCALCITON", "LATICACIDVEN" in the last 168 hours.  Recent Results (from the past 240 hours)  Resp panel by RT-PCR (RSV, Flu A&B, Covid) Anterior Nasal Swab     Status: None   Collection Time: 02/16/23  6:31 PM   Specimen: Anterior Nasal Swab  Result Value Ref Range Status   SARS Coronavirus 2 by RT PCR NEGATIVE NEGATIVE Final    Comment: (NOTE) SARS-CoV-2 target nucleic acids are NOT DETECTED.  The SARS-CoV-2 RNA is generally detectable in upper respiratory specimens during the acute phase of infection. The lowest concentration of SARS-CoV-2 viral copies this assay can detect is 138 copies/mL. A negative result does not preclude SARS-Cov-2 infection and should not be used as the sole basis for treatment or other patient management decisions. A negative result may occur with  improper specimen collection/handling, submission of specimen other than nasopharyngeal swab, presence of viral mutation(s) within the areas targeted by this assay, and inadequate  number of viral copies(<138 copies/mL). A negative result must be combined with clinical observations, patient history, and epidemiological information. The expected result is Negative.  Fact Sheet for Patients:  BloggerCourse.com  Fact Sheet for Healthcare Providers:  SeriousBroker.it  This test is no t yet approved or cleared by the [place] FDA and  has been authorized for detection and/or diagnosis of SARS-CoV-2 by FDA under an Emergency Use Authorization (EUA). This EUA will remain  in effect (meaning this test can be used) for the duration of the COVID-19 declaration under Section 564(b)(1) of the Act, 21 U.S.C.section 360bbb-3(b)(1), unless the authorization is terminated  or revoked sooner.       Influenza A by PCR NEGATIVE NEGATIVE Final   Influenza B by PCR NEGATIVE NEGATIVE Final    Comment: (NOTE) The Xpert Xpress SARS-CoV-2/FLU/RSV plus assay is intended as an aid in the diagnosis of influenza from Nasopharyngeal swab specimens and should not be used as a sole basis for treatment. Nasal washings and aspirates are unacceptable for Xpert Xpress SARS-CoV-2/FLU/RSV testing.  Fact Sheet for Patients: BloggerCourse.com  Fact Sheet for Healthcare Providers: SeriousBroker.it  This test is not yet approved or cleared by the [place] FDA and has been authorized for detection and/or diagnosis of SARS-CoV-2 by FDA under an Emergency Use Authorization (EUA). This  EUA will remain in effect (meaning this test can be used) for the duration of the COVID-19 declaration under Section 564(b)(1) of the Act, 21 U.S.C. section 360bbb-3(b)(1), unless the authorization is terminated or revoked.     Resp Syncytial Virus by PCR NEGATIVE NEGATIVE Final    Comment: (NOTE) Fact Sheet for Patients: BloggerCourse.com  Fact Sheet for Healthcare  Providers: SeriousBroker.it  This test is not yet approved or cleared by the [place] FDA and has been authorized for detection and/or diagnosis of SARS-CoV-2 by FDA under an Emergency Use Authorization (EUA). This EUA will remain in effect (meaning this test can be used) for the duration of the COVID-19 declaration under Section 564(b)(1) of the Act, 21 U.S.C. section 360bbb-3(b)(1), unless the authorization is terminated or revoked.  Performed at [facility], [address] [place], [place] [address]   CSF culture w Gram Stain     Status: None (Preliminary result)   Collection Time: 02/24/23 12:08 PM   Specimen: PATH Cytology CSF; Cerebrospinal Fluid  Result Value Ref Range Status   Specimen Description CSF  Final   Special Requests NONE  Final   Gram Stain   Final    WBC PRESENT, PREDOMINANTLY MONONUCLEAR NO ORGANISMS SEEN CYTOSPIN SMEAR    Culture   Final    NO GROWTH < 24 HOURS Performed at [facility], [address] [place], [place] [address]    Report Status PENDING  Incomplete         Radiology Studies: CT ANGIO HEAD NECK W WO CM Result Date: 02/25/2023 CLINICAL DATA:  Initial evaluation for vasculitis. EXAM: CT ANGIOGRAPHY HEAD AND NECK WITH AND WITHOUT CONTRAST TECHNIQUE: Multidetector CT imaging of the head and neck was performed using the standard protocol during bolus administration of intravenous contrast. Multiplanar CT image reconstructions and MIPs were obtained to evaluate the vascular anatomy. Carotid stenosis measurements (when applicable) are obtained utilizing NASCET criteria, using the distal internal carotid diameter as the denominator. RADIATION DOSE REDUCTION: This exam was performed according to the departmental dose-optimization program which includes automated exposure control, adjustment of the mA and/or kV according to patient size and/or use of iterative reconstruction technique.  CONTRAST:  75mL OMNIPAQUE IOHEXOL 350 MG/ML SOLN COMPARISON:  Prior CT and MRI from 02/23/2023 FINDINGS: CT HEAD FINDINGS Brain: Previously identified hypodense areas involving the right occipital lobe, posterior left insular region, right frontotemporal region, and right greater than left cerebellum noted. Associated petechial blood products about the dominant lesion at the right occipital lobe. Overall, appearance is relatively stable from prior exams. No appreciable new lesions. No other acute large vessel territory infarct. No other acute intracranial hemorrhage. No midline shift or hydrocephalus. No extra-axial fluid collection. Vascular: No abnormal hyperdense vessel. Skull: Scalp soft tissues and calvarium demonstrate no new finding. Sinuses/Orbits: Globes and orbital soft tissues within normal limits. Mild mucosal thickening noted about the maxillary sinuses bilaterally. Left sphenoid sinus retention cyst. No mastoid effusion. Other: None. Review of the MIP images confirms the above findings CTA NECK FINDINGS Aortic arch: Standard branching. Imaged portion shows no evidence of aneurysm or dissection. No significant stenosis of the major arch vessel origins. Right carotid system: No evidence of dissection, stenosis (50% or greater), or occlusion. No beading or irregularity. Left carotid system: No evidence of dissection, stenosis (50% or greater), or occlusion. No beading or irregularity. Vertebral arteries: Proximal left vertebral artery not well visualized due to adjacent venous contamination. Visualized vertebral arteries patent without dissection or stenosis.  No beading or irregularity. Skeleton: No discrete or worrisome osseous lesions. Other neck: Multiple prominent and hyperdense but not technically enlarged lymph nodes seen within the neck. No other acute finding. Upper chest: Prominent and hyperdense bilateral axillary lymph nodes, measuring up to 1.5 cm bilaterally. Prominent pre-vascular nodes  measure up to 9 mm, partially visualized. Few scatter pulmonary nodule seen within the visualized lungs, largest of which measures 6 mm within the left upper lobe (series 10, image 153). Review of the MIP images confirms the above findings CTA HEAD FINDINGS Anterior circulation: Both internal carotid arteries are widely patent to the siphons without stenosis or other abnormality. A1 segments patent bilaterally. Normal anterior communicating artery complex. Both ACAs widely patent without stenosis. No M1 stenosis or occlusion. Distal MCA branches perfused and symmetric. No beading or irregularity. Posterior circulation: Both V4 segments widely patent without stenosis. Both PICA patent. Basilar widely patent without stenosis. Superior cerebellar and posterior cerebral arteries patent bilaterally. No beading or irregularity. Venous sinuses: Patent allowing for timing the contrast bolus. Anatomic variants: None significant.  No aneurysm. Review of the MIP images confirms the above findings IMPRESSION: CT HEAD: 1. Multifocal hypodensities involving both cerebral hemispheres and cerebellum, relatively stable as compared to prior CT and MRI. Differential considerations as previously described. 2. No other new acute intracranial abnormality. CTA HEAD AND NECK: 1. Normal CTA of the head and neck. No large vessel occlusion, hemodynamically significant stenosis, or other acute vascular abnormality. No evidence for vasculitis. 2. Multiple prominent cervical, axillary, and mediastinal lymph nodes as above. Findings are nonspecific, and could possibly related to patient history of SLE. Reactive adenopathy due to an infectious or other inflammatory process could also be considered. 3. Few scattered pulmonary nodules measuring up to 6 mm within the visualized lungs. Findings are indeterminate, and could also be either infectious or inflammatory in nature. Per Fleischner Society Guidelines, recommend a non-contrast Chest CT at 3-6  months, then consider another non-contrast Chest CT at 18-24 months. If patient is low risk for malignancy, non-contrast Chest CT at 18-24 months is optional. These guidelines do not apply to immunocompromised patients and patients with cancer. Follow up in patients with significant comorbidities as clinically warranted. For lung cancer screening, adhere to Lung-RADS guidelines. Reference: Radiology. 2017; 284(1):228-43. Electronically Signed   By: Rise Mu M.D.   On: 02/25/2023 06:24   MR CERVICAL SPINE W WO CONTRAST Result Date: 02/24/2023 CLINICAL DATA:  Multiple sclerosis EXAM: MRI CERVICAL AND THORACIC SPINE WITHOUT AND WITH CONTRAST TECHNIQUE: Multiplanar and multiecho pulse sequences of the cervical spine, to include the craniocervical junction and cervicothoracic junction, and the thoracic spine, were obtained without and with intravenous contrast. CONTRAST:  10mL GADAVIST GADOBUTROL 1 MMOL/ML IV SOLN COMPARISON:  08/05/2022 MRI thoracic spine, 05/16/2020 MRI cervical spine FINDINGS: MRI CERVICAL SPINE FINDINGS Alignment: No listhesis. Vertebrae: No acute fracture, evidence of discitis, or suspicious osseous lesion. No abnormal enhancement. Cord: Normal morphology. T2 hyperintense foci in the cervical spinal cord at the left dorsolateral aspect at C4-C5 (series 4, image 23) and at the right lateral aspect at C6-C7 (series 4, image 29). Other previously suspected T2 hyperintense foci are not appreciated on this exam and may have been artifactual. Otherwise normal signal. No abnormal enhancement. Posterior Fossa, vertebral arteries, paraspinal tissues: No acute finding. Disc levels: C2-C3: No significant disc bulge. No spinal canal stenosis or neuroforaminal narrowing. C3-C4: No significant disc bulge. No spinal canal stenosis or neural foraminal narrowing. C4-C5: No significant disc bulge. No spinal  canal stenosis or neuroforaminal narrowing. C5-C6: No significant disc bulge. No spinal canal  stenosis or neuroforaminal narrowing. C6-C7: No significant disc bulge. No spinal canal stenosis or neuroforaminal narrowing. C7-T1: No significant disc bulge. No spinal canal stenosis or neuroforaminal narrowing. MRI THORACIC SPINE FINDINGS Alignment:  No listhesis. Vertebrae: No acute fracture, evidence of discitis, or suspicious osseous lesion. Redemonstrated hemivertebra at T11. Cord: Evaluation is somewhat limited by respiratory motion. Within this limitation, no abnormal spinal cord signal or enhancement is seen. Normal spinal cord morphology. In the epidural space, there is an epidural collection, which causes moderate thecal sac narrowing and anterior displacement of the thecal sac, which is new from the prior exam. This fluid tends to follow CSF on the provided sequences; for example, see series 22, image 33 and series 26, image 30). In addition, there is mild to moderate epidural fat. No evidence of abscess or phlegmon. Paraspinal and other soft tissues: No acute finding. Disc levels: No osseous spinal canal stenosis. There is moderate thecal sac narrowing from approximately T3-T4 through T11-T12, favored to be secondary to an epidural fluid collection, which may be the sequela of the recent lumbar puncture. No high-grade neural foraminal narrowing. IMPRESSION: 1. T2 hyperintense foci in the cervical spinal cord at C4-C5 and C6-C7, consistent with the patient's history of multiple sclerosis. No abnormal enhancement to suggest active demyelination. 2. No abnormal spinal cord signal or enhancement in the thoracic spine. 3. Epidural fluid collection in the thoracic spine, which causes moderate thecal sac narrowing and anterior displacement of the thecal sac, new from the prior exam. This may be the sequela of the recent lumbar puncture. Correlate with physical exam. Electronically Signed   By: Wiliam Ke M.D.   On: 02/24/2023 19:48   MR THORACIC SPINE W WO CONTRAST Result Date: 02/24/2023 CLINICAL DATA:   Multiple sclerosis EXAM: MRI CERVICAL AND THORACIC SPINE WITHOUT AND WITH CONTRAST TECHNIQUE: Multiplanar and multiecho pulse sequences of the cervical spine, to include the craniocervical junction and cervicothoracic junction, and the thoracic spine, were obtained without and with intravenous contrast. CONTRAST:  10mL GADAVIST GADOBUTROL 1 MMOL/ML IV SOLN COMPARISON:  08/05/2022 MRI thoracic spine, 05/16/2020 MRI cervical spine FINDINGS: MRI CERVICAL SPINE FINDINGS Alignment: No listhesis. Vertebrae: No acute fracture, evidence of discitis, or suspicious osseous lesion. No abnormal enhancement. Cord: Normal morphology. T2 hyperintense foci in the cervical spinal cord at the left dorsolateral aspect at C4-C5 (series 4, image 23) and at the right lateral aspect at C6-C7 (series 4, image 29). Other previously suspected T2 hyperintense foci are not appreciated on this exam and may have been artifactual. Otherwise normal signal. No abnormal enhancement. Posterior Fossa, vertebral arteries, paraspinal tissues: No acute finding. Disc levels: C2-C3: No significant disc bulge. No spinal canal stenosis or neuroforaminal narrowing. C3-C4: No significant disc bulge. No spinal canal stenosis or neural foraminal narrowing. C4-C5: No significant disc bulge. No spinal canal stenosis or neuroforaminal narrowing. C5-C6: No significant disc bulge. No spinal canal stenosis or neuroforaminal narrowing. C6-C7: No significant disc bulge. No spinal canal stenosis or neuroforaminal narrowing. C7-T1: No significant disc bulge. No spinal canal stenosis or neuroforaminal narrowing. MRI THORACIC SPINE FINDINGS Alignment:  No listhesis. Vertebrae: No acute fracture, evidence of discitis, or suspicious osseous lesion. Redemonstrated hemivertebra at T11. Cord: Evaluation is somewhat limited by respiratory motion. Within this limitation, no abnormal spinal cord signal or enhancement is seen. Normal spinal cord morphology. In the epidural space,  there is an epidural collection, which causes moderate thecal sac  narrowing and anterior displacement of the thecal sac, which is new from the prior exam. This fluid tends to follow CSF on the provided sequences; for example, see series 22, image 33 and series 26, image 30). In addition, there is mild to moderate epidural fat. No evidence of abscess or phlegmon. Paraspinal and other soft tissues: No acute finding. Disc levels: No osseous spinal canal stenosis. There is moderate thecal sac narrowing from approximately T3-T4 through T11-T12, favored to be secondary to an epidural fluid collection, which may be the sequela of the recent lumbar puncture. No high-grade neural foraminal narrowing. IMPRESSION: 1. T2 hyperintense foci in the cervical spinal cord at C4-C5 and C6-C7, consistent with the patient's history of multiple sclerosis. No abnormal enhancement to suggest active demyelination. 2. No abnormal spinal cord signal or enhancement in the thoracic spine. 3. Epidural fluid collection in the thoracic spine, which causes moderate thecal sac narrowing and anterior displacement of the thecal sac, new from the prior exam. This may be the sequela of the recent lumbar puncture. Correlate with physical exam. Electronically Signed   By: Wiliam Ke M.D.   On: 02/24/2023 19:48   EEG adult Result Date: 02/24/2023 Charlsie Quest, MD     02/24/2023  4:55 PM Patient Name: AUBRYELLA HOOSE MRN: [id number] Epilepsy Attending: Charlsie Quest Referring Physician/Provider: Gordy Councilman, MD Date: 02/24/2023 Duration: 22.36 mins Patient history: 38yo F with MS, intermittent visual disturbance on left. EEG to evaluate for seizure Level of alertness: Awake AEDs during EEG study: None Technical aspects: This EEG study was done with scalp electrodes positioned according to the 10-20 International system of electrode placement. Electrical activity was reviewed with band pass filter of 1-70Hz , sensitivity of 7 uV/mm, display  speed of 53mm/sec with a 60Hz  notched filter applied as appropriate. EEG data were recorded continuously and digitally stored.  Video monitoring was available and reviewed as appropriate. Description: EEG showed continuous generalized and maximal right parieto-occipital sharply contoured 3 to 6 Hz theta-delta slowing. Hyperventilation and photic stimulation were not performed.   ABNORMALITY - Continuous slow, generalized and maximal right parieto-occipital IMPRESSION: This study is suggestive of cortical dysfunction arising from right parieto-occipital likely secondary to underlying structural abnormality. Additionally there is moderate diffuse encephalopathy. No seizures or definite epileptiform discharges were seen throughout the recording. Priyanka Annabelle Harman   DG FL GUIDED LUMBAR PUNCTURE Result Date: 02/24/2023 CLINICAL DATA:  Patient with history of lupus, multiple sclerosis who presented to the ED with complaints of headache, blurred vision and weakness for 2 weeks. MRI significant for multiple subacute lesions concerning CNS vasculitis, cerebritis, tumefactive MS or infection. Request for small volume LP. EXAM: LUMBAR PUNCTURE UNDER FLUOROSCOPY PROCEDURE: An appropriate skin entry site was determined fluoroscopically. Operator donned sterile gloves and mask. Skin site was marked, then prepped with Betadine, draped in usual sterile fashion, and infiltrated locally with 1% lidocaine. A 20 gauge spinal needle advanced into the thecal sac at L4-L5. Clear colorless CSF spontaneously returned, with opening pressure of 25 cm water. 6.5 ml CSF were collected and divided among 4 sterile vials for the requested laboratory studies. The needle was then removed. The patient tolerated the procedure well and there were no complications. FLUOROSCOPY: Radiation Exposure Index (as provided by the fluoroscopic device): 3.1 mGy Kerma IMPRESSION: Technically successful lumbar puncture under fluoroscopy. This exam was performed  by Lynnette Caffey, PA-C, and was supervised and interpreted by Marliss Coots, MD. Electronically Signed   By: Jae Dire.D.  On: 02/24/2023 12:11        Scheduled Meds:  azaTHIOprine  150 mg Oral Daily   cholecalciferol  1,000 Units Oral Daily   enoxaparin (LOVENOX) injection  40 mg Subcutaneous Q24H   hydroxychloroquine  200 mg Oral BID   iohexol  500 mL Oral Q1H   [START ON 02/26/2023] ketoconazole  1 Application Topical Weekly   metoprolol succinate  25 mg Oral QHS   pantoprazole  40 mg Oral Daily   sodium chloride flush  3 mL Intravenous Q12H   Continuous Infusions:     LOS: 2 days    Time spent: 52 minutes spent on chart review, discussion with nursing staff, consultants, updating family and interview/physical exam; more than 50% of that time was spent in counseling and/or coordination of care.    Alvira Philips [place], DO [facility] Available via Epic secure chat 7am-7pm After these hours, please refer to coverage provider listed on [email or web address] 02/25/2023, 2:43 PM

## 2023-02-25 NOTE — TOC Progression Note (Signed)
Transition of Care [facility]) - Progression Note    Patient Details  Name: Emily Hicks MRN: [id number] Date of Birth: 1985-04-23  [facility]) CM/SW Contact  Kermit Balo, RN Phone Number: 02/25/2023, 12:11 PM  Clinical Narrative:     Pcp appointment made with [facility] [place]. Information on the AVS.   Expected Discharge Plan:  (TBD) Barriers to Discharge: Continued Medical Work up  Expected Discharge Plan and Services   Discharge Planning Services: CM Consult   Living arrangements for the past 2 months: Single Family Home                                       Social Determinants of Health (SDOH) Interventions SDOH Screenings   Food Insecurity: No Food Insecurity (02/23/2023)  Housing: Low Risk  (02/23/2023)  Transportation Needs: No Transportation Needs (02/23/2023)  Utilities: Not At Risk (02/23/2023)  Depression (PHQ2-9): Low Risk  (08/15/2021)  Tobacco Use: Low Risk  (02/23/2023)    Readmission Risk Interventions     No data to display

## 2023-02-25 NOTE — Progress Notes (Signed)
OT Cancellation Note  Patient Details Name: Emily Hicks MRN: [id number] DOB: Apr 20, 1985   Cancelled Treatment:    Reason Eval/Treat Not Completed: Medical issues which prohibited therapy (Pt conitnues to have elevated BP outside of MDs parameters, OT evaluation to f/u when appropriate.)  Donia Pounds 02/25/2023, 3:52 PM

## 2023-02-25 NOTE — Progress Notes (Signed)
PT Cancellation Note  Patient Details Name: Emily Hicks MRN: [id number] DOB: 1986-01-05   Cancelled Treatment:    Reason Eval/Treat Not Completed: (P) Patient not medically ready Pt BP remains elevated outside physician set parameters. PT will follow back as able today to determine appropriateness.   [name] B. Beverely Risen PT, DPT Acute Rehabilitation Services Please use secure chat or  Call Office [phone]    [name] [facility] 02/25/2023, 1:39 PM

## 2023-02-25 NOTE — Progress Notes (Signed)
NEUROLOGY CONSULT FOLLOW UP NOTE   Date of service: February 25, 2023 Patient Name: Emily Hicks MRN:  [id number] DOB:  1985-07-11 (age 38)  Interval Hx/subjective  - Positional headache on standing, improving with laying down - Otherwise feels unchanged with stable complaints - No more intermittent visual changes on the left side  Vitals   Vitals:   02/24/23 1948 02/24/23 2333 02/25/23 0333 02/25/23 0833  BP: (!) 143/96 (!) 138/91 (!) 149/100 (!) 151/110  Pulse: (!) 102 (!) 108 (!) 106 (!) 110  Resp: 18 18 18 18   Temp: 98.5 F (36.9 C) 97.7 F (36.5 C) 99.8 F (37.7 C) 98.7 F (37.1 C)  TempSrc: Oral Oral Oral Oral  SpO2: 100% 100% 100% 100%  Weight:      Height:         Body mass index is 36.84 kg/m.  Physical Exam   Constitutional: Appears well-developed and well-nourished.  Psych: Affect calm, cooperative  Eyes: No scleral injection.  HENT: No OP obstrucion.  Head: Normocephalic.  Cardiovascular: Normal rate and regular rhythm.  Respiratory: Effort normal, non-labored breathing GI: Soft.  No distension. There is no tenderness.  Skin: WDI.   Neurologic Examination   Neuro: Mental Status: Patient is awake, alert, oriented to person, place, month, year, and situation. Patient is able to give a clear and coherent history, including details of medications, recalls reviewing MRI with me yesterday  No signs of aphasia in casual speech or neglect Cranial Nerves: II: Visual Fields with left stable hemianopia. Pupils are equal, round, and reactive to light.   III,IV, VI: EOMI without ptosis or diploplia.  V: Facial sensation is symmetric to temperature VII: Facial movement is symmetric overall, maybe subtle left facial droop at most.  VIII: hearing is intact to voice X: Uvula elevates symmetrically XI: Shoulder shrug is symmetric. XII: tongue is midline without atrophy or fasciculations.  Motor: No pronator drift confirmed BUE 4/5 proximally and 5/5 distally -- BLE 3/5  bilateral hip flexion (limited by thigh pain to palpation), 4/5 knee extension, knee flexion, ADF/APF without asymmetry." Sensory: Sensation is symmetric to light touch and temperature in the arms and legs; reports perhaps some paraesthesias on the left arm, intermittent Allodynia on palpation of thighs and left lower leg  Reflexes:  3+ symmetric bilateral patellae and brachioradialis. 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   Medications  Current Facility-Administered Medications:    acetaminophen (TYLENOL) tablet 650 mg, 650 mg, Oral, Q6H PRN, 650 mg at 02/24/23 1342 **OR** acetaminophen (TYLENOL) suppository 650 mg, 650 mg, Rectal, Q6H PRN, Pokhrel, Laxman, MD   azaTHIOprine (IMURAN) tablet 150 mg, 150 mg, Oral, Daily, [place], Eric J, DO, 150 mg at 02/25/23 1005   cholecalciferol (VITAMIN D3) 25 MCG (1000 UNIT) tablet 1,000 Units, 1,000 Units, Oral, Daily, [place], Alvira Philips, DO, 1,000 Units at 02/25/23 1004   clobetasol ointment (TEMOVATE) 0.05 % 1 Application, 1 Application, Topical, Daily PRN, Pokhrel, Laxman, MD   enoxaparin (LOVENOX) injection 40 mg, 40 mg, Subcutaneous, Q24H, Pokhrel, Laxman, MD, 40 mg at 02/24/23 2132   gabapentin (NEURONTIN) capsule 300 mg, 300 mg, Oral, TID PRN, Pokhrel, Laxman, MD   hydrALAZINE (APRESOLINE) injection 10 mg, 10 mg, Intravenous, Q6H PRN, Pokhrel, Laxman, MD, 10 mg at 02/24/23 1749   HYDROcodone-acetaminophen (NORCO/VICODIN) 5-325 MG per tablet 1-2 tablet, 1-2 tablet, Oral, Q4H PRN, Pokhrel, Laxman, MD, 1 tablet at 02/23/23 2013   hydroxychloroquine (PLAQUENIL) tablet 200 mg, 200 mg, Oral, BID, Pokhrel, Laxman, MD, 200 mg at 02/25/23 1006   [  START ON 02/26/2023] ketoconazole (NIZORAL) 2 % shampoo 1 Application, 1 Application, Topical, Weekly, Pokhrel, Laxman, MD   ketorolac (TORADOL) 30 MG/ML injection 30 mg, 30 mg, Intravenous, Q6H PRN, Pokhrel, Laxman, MD   LORazepam (ATIVAN) injection 1 mg, 1 mg, Intravenous, PRN, Pokhrel, Laxman, MD, 1 mg at 02/24/23 1758    metoprolol succinate (TOPROL-XL) 24 hr tablet 25 mg, 25 mg, Oral, QHS, Pokhrel, Laxman, MD, 25 mg at 02/24/23 2133   ondansetron (ZOFRAN) tablet 4 mg, 4 mg, Oral, Q6H PRN **OR** ondansetron (ZOFRAN) injection 4 mg, 4 mg, Intravenous, Q6H PRN, Pokhrel, Laxman, MD   pantoprazole (PROTONIX) EC tablet 40 mg, 40 mg, Oral, Daily, Pokhrel, Laxman, MD, 40 mg at 02/25/23 1004   polyethylene glycol (MIRALAX / GLYCOLAX) packet 17 g, 17 g, Oral, Daily PRN, Pokhrel, Laxman, MD   sodium chloride flush (NS) 0.9 % injection 3 mL, 3 mL, Intravenous, Q12H, Pokhrel, Laxman, MD, 3 mL at 02/25/23 1006   sodium chloride flush (NS) 0.9 % injection 3 mL, 3 mL, Intravenous, PRN, Pokhrel, Laxman, MD   zolpidem (AMBIEN) tablet 5 mg, 5 mg, Oral, QHS PRN, Pokhrel, Laxman, MD  Labs and Diagnostic Imaging   Autoimmune/infectious workup - LP, low volume due to right parietal mass lesion (which is not obstructing) RBC 31/4, WBC 3/4, Protein 76, glucose 38 (~50% of serum 76), meningitis/encephalitis panel negative,  gram stain neg (no organisms, mononuclear WBC) Pending: IgG index, OCB, cytology, fungal and bacterial cultures. CSF TB PCR - Serum labs:  - Quantiferon gold testing pending - Toxoplasma IgG 81.2, IgM 22.2, Toxo PCR pending - HIV, RPR pending - CD4/CD3 counts pending  - JC Virus Titer pending (Feb 2020 Titer 3.28) - Anti-MOG antibody pending  Basic Metabolic Panel: Recent Labs  Lab 02/23/23 1100 02/24/23 0521 02/25/23 0549  NA 142 139 141  K 3.8 3.3* 3.8  CL 112* 110 114*  CO2 22 21* 20*  GLUCOSE 90 92 101*  BUN 12 9 8   CREATININE 0.92 0.89 0.82  CALCIUM 8.4* 8.0* 8.2*  MG  --  1.5* 1.9  PHOS  --  2.7  --     CBC: Recent Labs  Lab 02/23/23 1100 02/24/23 0521  WBC 5.1 3.7*  NEUTROABS 4.3  --   HGB 10.0* 9.6*  HCT 32.1* 30.2*  MCV 86.5 85.8  PLT 287 260    CT Head without contrast(Personally reviewed): Multifocal cerebral hypodensities, including a 5 cm edematous area in the right  parietal lobe. These are indeterminate with considerations including subacute ischemia, neoplasm, an infectious/inflammatory process, and tumefactive demyelination. A brain MRI without and with contrast is recommended for further evaluation.  CT angio Head and Neck with contrast(Personally reviewed): 1. Normal CTA of the head and neck. No large vessel occlusion, hemodynamically significant stenosis, or other acute vascular abnormality. No evidence for vasculitis. 2. Multiple prominent cervical, axillary, and mediastinal lymph nodes as above. Findings are nonspecific, and could possibly related to patient history of SLE. Reactive adenopathy due to an infectious or other inflammatory process could also be considered. 3. Few scattered pulmonary nodules measuring up to 6 mm within the visualized lungs. Findings are indeterminate, and could also be either infectious or inflammatory in nature. Per Fleischner Society Guidelines, recommend a non-contrast Chest CT at 3-6 months, then consider another non-contrast Chest CT at 18-24 months. If patient is low risk for malignancy, non-contrast Chest CT at 18-24 months is optional. These guidelines do not apply to immunocompromised patients and patients with cancer. Follow up in  patients with significant comorbidities as clinically warranted. For lung cancer screening, adhere to Lung-RADS guidelines. Reference: Radiology. 2017; 284(1):228-43.  MRI Brain 02/23/2023 (Personally reviewed): Multifocal regions of predominantly cortical diffusion restriction with associated contrast enhancement, involving the bilateral insular regions, right occipital lobe, and left cerebellum. Findings are nonspecific, but with predominantly cortical involvement, CNS vasculitis is a differential consideration. Given patient's history, an additional differential consideration is demyelinating disease. Recommend further evaluation with a CTA head and neck  angiogram. Second personal review of MRI by Dr. Otelia Limes (Neurology): MRI reveals a medium sized subacute-appearing cortically based lesion with subjacent white matter involvement in the right occipital lobe that has patchy internal enhancement. Also noted is a lesion that is small and cortically based in the left anterior temporal lobe. Smaller cortically based right frontal lobe lesions also noted. On personal review of the images, there also appear to be bilateral subtle FLAIR-hyperintense cerebellar lesions. All of the above show some degree of diffusion restriction. Only the right occipital lobe lesion appears to enhance.   MRI brain 08/05/2022 (personally reviewed) Small number of T2/FLAIR hypertense foci in the cerebral hemispheres and 1 focus in the right upper pons in a pattern consistent with chronic demyelinating plaque associated with multiple sclerosis.  None of the foci appear to be acute.  Compared to previous MRIs, there were no new lesions. Normal enhancement pattern.  No acute findings.  MRI C and T-spine w/ and w/o 02/24/2023 1. T2 hyperintense foci in the cervical spinal cord at C4-C5 and C6-C7, consistent with the patient's history of multiple sclerosis. No abnormal enhancement to suggest active demyelination. 2. No abnormal spinal cord signal or enhancement in the thoracic spine. 3. Epidural fluid collection in the thoracic spine, which causes moderate thecal sac narrowing and anterior displacement of the thecal sac, new from the prior exam. This may be the sequela of the recent lumbar puncture. Correlate with physical exam.  rEEG 1/21:  - Continuous slow, generalized and maximal right parieto-occipital This study is suggestive of cortical dysfunction arising from right parieto-occipital likely secondary to underlying structural abnormality. Additionally there is moderate diffuse encephalopathy. No seizures or definite epileptiform discharges were seen throughout the  recording.  Assessment   38 y.o. female with a past medical history of Cervical intraepithelial neoplasia III, Gallstones, GERD (gastroesophageal reflux disease), History of blood transfusion (2020), Lupus (dx 2020), Multiple sclerosis (dx 2020), and Numbness and tingling of left leg who presents with a 2 week history of diffuse weakness and malaise in conjunction with headache, runny nose, nausea, congestion, and nose bleeds x1 week. She is seen for her MS by Dr. Epimenio Foot. She is on Plaquenil for her lupus. Also on azathioprine for her lupus which patient states resulted in a decision by Dr. Epimenio Foot not to add any additional disease modifying therapy for her MS. She has been transferred to [facility] for Neurological consultation and further workup.  - Exam is stable and notable for left homonymous hemianopsia and mild weakness proximal > distal.  The left homonymous hemianopsia corresponds to the subacute medium-sized partially enhancing lesion seen on MRI (see below)  Brain imaging is notable for marked new lesions since July 2024, with imaging findings much more impressive than the patient's examination, multifocal as detailed above.  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 Epimenio Foot, neuro immunologist, tumefactive MS is felt to be lower in the differential, other etiologies felt to be less likely include vasculitis, less likely fungal infection given CSF does not seem inflammatory  She has new lymphadenopathy and pulmonary nodules on CT imaging; will investigate further for a possible biopsy target with CT Chest/Abdomen/Pelvis today   Recommendations  - CT  chest/abdomen/pelvis for malignancy screening - Insufficient CSF for all testing. If repeat LP obtained will consider JCV PCR (0.2-0.5 cc required), Toxoplasma PCR (0.2-0.5 cc required), EBV PCR, CMV PCR; hold for now pending other studies  - HIV, RPR, EBV, CMV, bartonella (discussed with ID, reasonable to order) - CK, Aldolase and LDH due to myalgias of the thighs, screen for myositis - ESR, CRP, C3, C4 - continue azathioprine and plaquenil for now pending diagnostic clarity - Encouraged fluid intake and caffeine intake for likely post-LP headache  - Neurology will follow along, discussed with ID and primary via secure chat (and outpatient neurologist Dr. Epimenio Foot via phone on 1/21) - Discussed with patient and mom at bedside ______________________________________________________________________   Brooke Dare MD-PhD [facility] [phone] Available 7 AM to 7 PM, outside these hours please contact Neurologist on call listed on AMION

## 2023-02-25 NOTE — Plan of Care (Signed)
  Problem: Health Behavior/Discharge Planning: Goal: Ability to manage health-related needs will improve Outcome: Not Progressing   Problem: Education: Goal: Knowledge of General Education information will improve Description: Including pain rating scale, medication(s)/side effects and non-pharmacologic comfort measures Outcome: Progressing   Problem: Clinical Measurements: Goal: Ability to maintain clinical measurements within normal limits will improve Outcome: Not Progressing Goal: Diagnostic test results will improve Outcome: Not Progressing

## 2023-02-26 ENCOUNTER — Inpatient Hospital Stay (HOSPITAL_COMMUNITY): Payer: 59

## 2023-02-26 DIAGNOSIS — G939 Disorder of brain, unspecified: Secondary | ICD-10-CM | POA: Diagnosis not present

## 2023-02-26 DIAGNOSIS — G35 Multiple sclerosis: Secondary | ICD-10-CM | POA: Diagnosis not present

## 2023-02-26 DIAGNOSIS — R519 Headache, unspecified: Secondary | ICD-10-CM

## 2023-02-26 LAB — CBC
HCT: 30.1 % — ABNORMAL LOW (ref 36.0–46.0)
Hemoglobin: 9.7 g/dL — ABNORMAL LOW (ref 12.0–15.0)
MCH: 27.6 pg (ref 26.0–34.0)
MCHC: 32.2 g/dL (ref 30.0–36.0)
MCV: 85.5 fL (ref 80.0–100.0)
Platelets: 289 10*3/uL (ref 150–400)
RBC: 3.52 MIL/uL — ABNORMAL LOW (ref 3.87–5.11)
RDW: 13.6 % (ref 11.5–15.5)
WBC: 4.9 10*3/uL (ref 4.0–10.5)
nRBC: 0 % (ref 0.0–0.2)

## 2023-02-26 LAB — IGG CSF INDEX
Albumin CSF-mCnc: 41 mg/dL — ABNORMAL HIGH (ref 7–29)
Albumin: 3.4 g/dL — ABNORMAL LOW (ref 3.9–4.9)
CSF IgG Index: 1.1 — ABNORMAL HIGH (ref 0.0–0.7)
IgG (Immunoglobin G), Serum: 1773 mg/dL — ABNORMAL HIGH (ref 586–1602)
IgG, CSF: 23.8 mg/dL — ABNORMAL HIGH (ref 0.0–6.7)
IgG/Alb Ratio, CSF: 0.58 — ABNORMAL HIGH (ref 0.00–0.25)

## 2023-02-26 LAB — BASIC METABOLIC PANEL
Anion gap: 7 (ref 5–15)
BUN: 9 mg/dL (ref 6–20)
CO2: 20 mmol/L — ABNORMAL LOW (ref 22–32)
Calcium: 7.9 mg/dL — ABNORMAL LOW (ref 8.9–10.3)
Chloride: 111 mmol/L (ref 98–111)
Creatinine, Ser: 0.89 mg/dL (ref 0.44–1.00)
GFR, Estimated: >60 mL/min (ref >60)
Glucose, Bld: 113 mg/dL — ABNORMAL HIGH (ref 70–99)
Potassium: 3.6 mmol/L (ref 3.5–5.1)
Sodium: 138 mmol/L (ref 135–145)

## 2023-02-26 LAB — VOLATILES,BLD-ACETONE,ETHANOL,ISOPROP,METHANOL
Acetone, blood: <0.01 g/dL (ref 0.000–0.010)
Ethanol, blood: <0.01 g/dL (ref 0.000–0.010)
Isopropanol, blood: <0.01 g/dL (ref 0.000–0.010)
Methanol, blood: <0.01 g/dL (ref 0.000–0.010)

## 2023-02-26 LAB — MISC LABCORP TEST (SEND OUT): Labcorp test code: 505310

## 2023-02-26 LAB — CK: Total CK: 28 U/L — ABNORMAL LOW (ref 38–234)

## 2023-02-26 LAB — MAGNESIUM: Magnesium: 1.8 mg/dL (ref 1.7–2.4)

## 2023-02-26 LAB — LACTATE DEHYDROGENASE: LDH: 190 U/L (ref 98–192)

## 2023-02-26 LAB — SEDIMENTATION RATE: Sed Rate: 25 mm/h — ABNORMAL HIGH (ref 0–22)

## 2023-02-26 LAB — C-REACTIVE PROTEIN: CRP: 0.6 mg/dL (ref <1.0)

## 2023-02-26 MED ORDER — GADOBUTROL 1 MMOL/ML IV SOLN
7.0000 mL | Freq: Once | INTRAVENOUS | Status: AC | PRN
  Administered 2023-02-26: 7 mL via INTRAVENOUS

## 2023-02-26 MED ORDER — ALPRAZOLAM 0.25 MG PO TABS
0.5000 mg | ORAL_TABLET | ORAL | Status: AC | PRN
  Administered 2023-02-26: 0.5 mg via ORAL
  Filled 2023-02-26: qty 2

## 2023-02-26 MED ORDER — MIRTAZAPINE 15 MG PO TBDP
7.5000 mg | ORAL_TABLET | Freq: Every day | ORAL | Status: DC
  Administered 2023-02-26 – 2023-02-28 (×3): 7.5 mg via ORAL
  Filled 2023-02-26 (×4): qty 0.5

## 2023-02-26 NOTE — Progress Notes (Addendum)
NEUROLOGY CONSULT FOLLOW UP NOTE   Date of service: February 26, 2023 Patient Name: Emily Hicks MRN:  [id number] DOB:  1985-05-25   Brief HPI   38 year old woman who was diagnosed with MS in early 2020 but has had symptoms since 2010. She also has SLE.  Currently on azathioprine 150 mg daily and HCQ 200 mg po bid.    Not currently on a biologic but was on Saphnelo , an IFN-1 receptor antagonist IV  Also per notes previously tried [place] and took only one week before having a hospitalization for fever, hypernatremia, joint inflammation, thrush) and it was stopped (LFT elevated on this medication) Last admission 11/2022, diagnosed with myositis attributed to her SLE and started on prednisone at that time. Patient did report to me she is no longer on prednisone but I am not clear on when it was stopped; she thinks sometime in Dec   Prior testing for DMARD safety labs includes negative TB and JCV positive > 1.5   Interval Hx/subjective  - Positional headache has improved - Reporting continued poor PO intake since Monday, received IVF overnight  - Tmax 101 (cultures collected) - Symptoms otherwise stable  Vitals   Vitals:   02/25/23 2335 02/25/23 2343 02/26/23 0421 02/26/23 0514  BP: (!) 128/103 131/88 (!) 141/106 (!) 138/95  Pulse: (!) 114  (!) 101 (!) 106  Resp: 18  18 18   Temp: 99.6 F (37.6 C)  (!) 101 F (38.3 C) 99.9 F (37.7 C)  TempSrc: Oral  Oral Oral  SpO2: 98%  100% 99%  Weight:   87.7 kg   Height:         Body mass index is 36.52 kg/m.  Physical Exam   Constitutional: Appears less fatigued than 1/22  Psych: Affect calm, cooperative  Eyes: No scleral injection.  HENT: No OP obstrucion.  Head: Normocephalic.  Cardiovascular: Normal rate and regular rhythm.  Respiratory: Effort normal, non-labored breathing GI: Soft.  No distension. There is no tenderness.  Skin: WDI.   Neurologic Examination   Neuro:  Mental Status: Patient is awake, alert, oriented to  person, place, month, year, and situation. Patient is able to give a clear and coherent history, including details of medications, good understanding of prior discussions  No signs of aphasia in casual speech or neglect Cranial Nerves: II: Visual Fields with left stable hemianopia. Pupils are equal, round, and reactive to light.   III,IV, VI: EOMI without ptosis or diploplia.  V: Facial sensation is symmetric to temperature VII: Facial movement is symmetric no clear droop today VIII: hearing is intact to voice X: Uvula elevates symmetrically XI: Shoulder shrug is symmetric. XII: tongue is midline without atrophy or fasciculations.  Motor: No pronator drift  Strength last tested 1/22 BUE 4/5 proximally and 5/5 distally -- BLE 3/5 bilateral hip flexion (limited by thigh pain to palpation), 4/5 knee extension, knee flexion, ADF/APF without asymmetry. Sensory: Reports reduced sensation on the left face/arm/leg  Reflexes:  On 1/22: 3+ symmetric bilateral patellae and brachioradialis. 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   Medications  Current Facility-Administered Medications:    acetaminophen (TYLENOL) tablet 650 mg, 650 mg, Oral, Q6H PRN, 650 mg at 02/26/23 0435 **OR** acetaminophen (TYLENOL) suppository 650 mg, 650 mg, Rectal, Q6H PRN, Pokhrel, Laxman, MD   azaTHIOprine (IMURAN) tablet 150 mg, 150 mg, Oral, Daily, [place], Eric J, DO, 150 mg at 02/25/23 1005   cholecalciferol (VITAMIN D3) 25 MCG (1000 UNIT) tablet 1,000 Units, 1,000 Units, Oral, Daily, [place],  Alvira Philips, DO, 1,000 Units at 02/25/23 1004   clobetasol ointment (TEMOVATE) 0.05 % 1 Application, 1 Application, Topical, Daily PRN, Pokhrel, Laxman, MD   enoxaparin (LOVENOX) injection 40 mg, 40 mg, Subcutaneous, Q24H, Pokhrel, Laxman, MD, 40 mg at 02/25/23 2112   gabapentin (NEURONTIN) capsule 300 mg, 300 mg, Oral, TID PRN, Pokhrel, Laxman, MD   hydrALAZINE (APRESOLINE) injection 10 mg, 10 mg, Intravenous, Q6H PRN, Pokhrel, Laxman, MD, 10 mg at  02/26/23 0435   HYDROcodone-acetaminophen (NORCO/VICODIN) 5-325 MG per tablet 1-2 tablet, 1-2 tablet, Oral, Q4H PRN, Pokhrel, Laxman, MD, 1 tablet at 02/25/23 2037   hydroxychloroquine (PLAQUENIL) tablet 200 mg, 200 mg, Oral, BID, Pokhrel, Laxman, MD, 200 mg at 02/25/23 2113   ketoconazole (NIZORAL) 2 % shampoo 1 Application, 1 Application, Topical, Weekly, Pokhrel, Laxman, MD   lactated ringers infusion, , Intravenous, Continuous, Howerter, Justin B, DO, Last Rate: 75 mL/hr at 02/26/23 0415, New Bag at 02/26/23 0415   LORazepam (ATIVAN) injection 1 mg, 1 mg, Intravenous, PRN, Pokhrel, Laxman, MD, 1 mg at 02/24/23 1758   metoprolol succinate (TOPROL-XL) 24 hr tablet 25 mg, 25 mg, Oral, QHS, Pokhrel, Laxman, MD, 25 mg at 02/25/23 2112   ondansetron (ZOFRAN) tablet 4 mg, 4 mg, Oral, Q6H PRN **OR** ondansetron (ZOFRAN) injection 4 mg, 4 mg, Intravenous, Q6H PRN, Pokhrel, Laxman, MD, 4 mg at 02/25/23 1609   pantoprazole (PROTONIX) EC tablet 40 mg, 40 mg, Oral, Daily, Pokhrel, Laxman, MD, 40 mg at 02/25/23 1004   polyethylene glycol (MIRALAX / GLYCOLAX) packet 17 g, 17 g, Oral, Daily PRN, Pokhrel, Laxman, MD   sodium chloride flush (NS) 0.9 % injection 3 mL, 3 mL, Intravenous, Q12H, Pokhrel, Laxman, MD, 3 mL at 02/25/23 2112   sodium chloride flush (NS) 0.9 % injection 3 mL, 3 mL, Intravenous, PRN, Pokhrel, Laxman, MD   zolpidem (AMBIEN) tablet 5 mg, 5 mg, Oral, QHS PRN, Pokhrel, Laxman, MD, 5 mg at 02/25/23 2113  Labs and Diagnostic Imaging   Autoimmune/infectious workup - LP, low volume due to right parietal mass lesion (which is not obstructing) RBC 31/4, WBC 3/4, Protein 76, glucose 38 (~50% of serum 76), meningitis/encephalitis panel negative,  gram stain neg (no organisms, mononuclear WBC) Pending: IgG index, OCB, cytology, fungal and bacterial cultures. CSF TB PCR - Serum labs:  - Quantiferon gold testing pending - Toxoplasma IgG 81.2, IgM 22.2, Toxo PCR not able to send yet due to  insufficient CSF - HIV (-), RPR (-) - CD4 390 / CD3 counts pending  - JC Virus Titer pending (Feb 2020 Titer 3.28) - Anti-MOG antibody pending - Pending EBV, CMV, bartonella - LDH 192  - CK 8947 (11/06/22) --> 21 (12/11/22) --> 28 (02/26/23) - Aldolase pending - ESR 90 (11/04/22) --> 42 (11/08/22) --> 25 (02/26/23) - CRP 5.2 (11/05/22) --> 0.9 (11/08/22) --> 0.6 (02/26/23) - C3, C4 in process  - Heavy metals blood pending - Volatiles (acetone,ethanol,isoprop,methanol) pending - Blood cultures 02/26/2023 pending  Basic Metabolic Panel: Recent Labs  Lab 02/23/23 1100 02/24/23 0521 02/25/23 0549  NA 142 139 141  K 3.8 3.3* 3.8  CL 112* 110 114*  CO2 22 21* 20*  GLUCOSE 90 92 101*  BUN 12 9 8   CREATININE 0.92 0.89 0.82  CALCIUM 8.4* 8.0* 8.2*  MG  --  1.5* 1.9  PHOS  --  2.7  --     CBC: Recent Labs  Lab 02/23/23 1100 02/24/23 0521 02/26/23 0647  WBC 5.1 3.7* 4.9  NEUTROABS 4.3  --   --  HGB 10.0* 9.6* 9.7*  HCT 32.1* 30.2* 30.1*  MCV 86.5 85.8 85.5  PLT 287 260 289    CT Head without contrast(Personally reviewed): Multifocal cerebral hypodensities, including a 5 cm edematous area in the right parietal lobe. These are indeterminate with considerations including subacute ischemia, neoplasm, an infectious/inflammatory process, and tumefactive demyelination. A brain MRI without and with contrast is recommended for further evaluation.  CT angio Head and Neck with contrast(Personally reviewed): 1. Normal CTA of the head and neck. No large vessel occlusion, hemodynamically significant stenosis, or other acute vascular abnormality. No evidence for vasculitis. 2. Multiple prominent cervical, axillary, and mediastinal lymph nodes as above. Findings are nonspecific, and could possibly related to patient history of SLE. Reactive adenopathy due to an infectious or other inflammatory process could also be considered. 3. Few scattered pulmonary nodules measuring up to 6 mm within  the visualized lungs. Findings are indeterminate, and could also be either infectious or inflammatory in nature. Per Fleischner Society Guidelines, recommend a non-contrast Chest CT at 3-6 months, then consider another non-contrast Chest CT at 18-24 months. If patient is low risk for malignancy, non-contrast Chest CT at 18-24 months is optional. These guidelines do not apply to immunocompromised patients and patients with cancer. Follow up in patients with significant comorbidities as clinically warranted. For lung cancer screening, adhere to Lung-RADS guidelines. Reference: Radiology. 2017; 284(1):228-43.  MRI Brain 02/23/2023 (Personally reviewed): Multifocal regions of predominantly cortical diffusion restriction with associated contrast enhancement, involving the bilateral insular regions, right occipital lobe, and left cerebellum. Findings are nonspecific, but with predominantly cortical involvement, CNS vasculitis is a differential consideration. Given patient's history, an additional differential consideration is demyelinating disease. Recommend further evaluation with a CTA head and neck angiogram. Second personal review of MRI by Dr. Otelia Limes (Neurology): MRI reveals a medium sized subacute-appearing cortically based lesion with subjacent white matter involvement in the right occipital lobe that has patchy internal enhancement. Also noted is a lesion that is small and cortically based in the left anterior temporal lobe. Smaller cortically based right frontal lobe lesions also noted. On personal review of the images, there also appear to be bilateral subtle FLAIR-hyperintense cerebellar lesions. All of the above show some degree of diffusion restriction. Only the right occipital lobe lesion appears to enhance.   MRI brain 08/05/2022 (personally reviewed) Small number of T2/FLAIR hypertense foci in the cerebral hemispheres and 1 focus in the right upper pons in a pattern consistent with  chronic demyelinating plaque associated with multiple sclerosis.  None of the foci appear to be acute.  Compared to previous MRIs, there were no new lesions. Normal enhancement pattern.  No acute findings.  MRI C and T-spine w/ and w/o 02/24/2023 1. T2 hyperintense foci in the cervical spinal cord at C4-C5 and C6-C7, consistent with the patient's history of multiple sclerosis. No abnormal enhancement to suggest active demyelination. 2. No abnormal spinal cord signal or enhancement in the thoracic spine. 3. Epidural fluid collection in the thoracic spine, which causes moderate thecal sac narrowing and anterior displacement of the thecal sac, new from the prior exam. This may be the sequela of the recent lumbar puncture. Correlate with physical exam.  CT chest/abdomen/pelvis 1. Small bilateral pleural effusions with associated bibasilar atelectasis. 2. Scattered tiny pulmonary nodules measuring up to 5 mm, nonspecific. While pulmonary metastatic disease is not strictly excluded, these are more commonly the result of remote infection or inflammation in a patient of this age. 3. Shotty bilateral axillary and external iliac  lymphadenopathy, nonspecific. No frankly pathologic adenopathy seen within the chest, abdomen, or pelvis. 4. Moderate hepatic steatosis. 5. Small hiatal hernia.  11/05/2022 MRI right and left femur 1. Severe, nearly symmetric T2 hyperintensity throughout the musculature of the lower pelvis and proximal thighs bilaterally consistent with myositis. There is asymmetric involvement of the left semitendinosus and biceps muscles. 2. No focal muscular atrophy or fluid collection identified. 3. No acute osseous findings. 4. Bilateral external and inguinal lymphadenopathy, further discussed on pelvic MRI.    rEEG 1/21:  - Continuous slow, generalized and maximal right parieto-occipital This study is suggestive of cortical dysfunction arising from right parieto-occipital  likely secondary to underlying structural abnormality. Additionally there is moderate diffuse encephalopathy. No seizures or definite epileptiform discharges were seen throughout the recording.  Assessment   38 y.o. female with a past medical history of Cervical intraepithelial neoplasia III, Gallstones, GERD (gastroesophageal reflux disease), History of blood transfusion (2020), Lupus (dx 2020), Multiple sclerosis (dx 2020), and Numbness and tingling of left leg who presents with a 2 week history of diffuse weakness and malaise in conjunction with headache, runny nose, nausea, congestion, and nose bleeds x1 week. Immunosuppressed on azathioprine and plaquenil Exam is overall stable and notable for left homonymous hemianopsia and mild weakness proximal > distal.  The left homonymous hemianopsia corresponds to the subacute medium-sized partially enhancing lesion seen on MRI.  Brain imaging is notable for marked new lesions since July 2024, with imaging findings much more impressive than the patient's examination, multifocal as detailed above.  Differential is broad for these lesions given her immunosuppression and autoimmune history  However, worsening ability to tolerate PO intake will obtain repeat MRI to assess for progression of lesions, consider empiric treatment with steroids and potentially also for toxo (will discuss with ID) if lesion worsening  She has been positive for JC virus in the past, certainly the right parietal lesion is atypical for PML but some of the other lesions are more consistent with a classic PML appearance  Also with azathioprine need to consider the possibility of an underlying lymphoma  In review with Dr. Epimenio Foot, neuro immunologist, tumefactive MS is felt to be lower in the differential, but will still consider this  She has new lymphadenopathy and pulmonary nodules on CT imaging, nonspecific; CT Chest/Abdomen/Pelvis does not show any clear pathological lymph nodes. Will  still attempt biopsy in attempt to clarify underlying diagnosis   Less likely vasculitis given reassuring CTA and minimally elevated ESR (actually improved from Oct 2024 when she had myositis) and minimally elevated CRP    Very unlikely fungal infection given CSF does not seem inflammatory  Recommendations  - MRI brain w/ and w/o repeat due to increasing vomitting - Insufficient CSF for all testing from prior LP. Will ask for repeat LP for 2-3 cc to test for JCV PCR (0.2-0.5 cc required), Toxoplasma PCR (0.2-0.5 cc required), EBV PCR, CMV PCR; hold for now pending other studies  - continue azathioprine and plaquenil for now pending diagnostic clarity - Appreciate IR for lymph node biopsy  - Neurology will follow along - Discussed with patient and mom at bedside ______________________________________________________________________   Brooke Dare MD-PhD [facility] [phone] Available 7 AM to 7 PM, outside these hours please contact Neurologist on call listed on AMION

## 2023-02-26 NOTE — Evaluation (Addendum)
Physical Therapy Evaluation Patient Details Name: Emily Hicks MRN: [id number] DOB: 01/16/1986 Today's Date: 02/26/2023  History of Present Illness  38 y.o. female who presented to [facility] ED on 1/20 with 2-week history of progressive generalized weakness, malaise, headache. Transferred to [facility] 1/20 for neurological work up. MRI reveals Multifocal regions of predominantly cortical diffusion restriction with associated contrast enhancement, involving the bilateral insular regions, right occipital lobe, and left cerebellum. EEG suggestive cortical dysfunction arising from right parieto-occipital likely secondary to underlying structural abnormality. 1/21 underwent LP  PMH: multiple sclerosis, lupus, GERD, cervical intraepithelial neoplasia, cholelithiasis  Clinical Impression   Pt admitted secondary to problem above with deficits below. PTA patient was living with spouse and 85 yo son. She did not use a device for walking, but did use a seat in the shower and has a rollator from previous use/needl.  Pt currently requires min assist to maneuver RW around objects on her left (despite cues to scan to her left). Patient's mother is a rehabilitation aide at a SNF and familiar with assisting pts to walk and do exercises. RN also ok with mother assisting pt up with RW.  Anticipate patient will benefit from PT to address problems listed below. Will continue to follow acutely to maximize functional mobility independence and safety. Recommend OPPT for continued strengthening.          If plan is discharge home, recommend the following: A little help with walking and/or transfers;Assistance with cooking/housework;Direct supervision/assist for medications management;Direct supervision/assist for financial management;Supervision due to cognitive status   Can travel by private vehicle        Equipment Recommendations None recommended by PT  Recommendations for Other Services  OT consult     Functional Status Assessment Patient has had a recent decline in their functional status and demonstrates the ability to make significant improvements in function in a reasonable and predictable amount of time.     Precautions / Restrictions Precautions Precautions: Fall Precaution Comments: left hemianopsia      Mobility  Bed Mobility Overal bed mobility: Needs Assistance Bed Mobility: Supine to Sit     Supine to sit: Supervision     General bed mobility comments: cues for initiation    Transfers Overall transfer level: Needs assistance Equipment used: Rolling walker (2 wheels) Transfers: Sit to/from Stand Sit to Stand: Contact guard assist           General transfer comment: no cues needed for safety; guarding for safety    Ambulation/Gait Ambulation/Gait assistance: Min assist Gait Distance (Feet): 100 Feet Assistive device: Rolling walker (2 wheels) Gait Pattern/deviations: Step-through pattern, Decreased stride length, Drifts right/left   Gait velocity interpretation: 1.31 - 2.62 ft/sec, indicative of limited community ambulator   General Gait Details: ran into objects on her left x 3 with no imbalance, but continued to occur despite cues to scan to her left  Stairs            Wheelchair Mobility     Tilt Bed    Modified Rankin (Stroke Patients Only)       Balance Overall balance assessment: Needs assistance Sitting-balance support: No upper extremity supported, Feet supported Sitting balance-Leahy Scale: Good     Standing balance support: Bilateral upper extremity supported, Reliant on assistive device for balance Standing balance-Leahy Scale: Poor  Pertinent Vitals/Pain Pain Assessment Pain Assessment: No/denies pain    Home Living Family/patient expects to be discharged to:: Private residence Living Arrangements: Spouse/significant other;Children (45 yo) Available Help at Discharge:  Family;Available 24 hours/day Type of Home: House Home Access: Stairs to enter Entrance Stairs-Rails: Right Entrance Stairs-Number of Steps: 3   Home Layout: One level Home Equipment: Educational psychologist (4 wheels) Additional Comments: Mother worked as Manufacturing systems engineer at [employer]    Prior Function Prior Level of Function : Independent/Modified Independent;Driving;Working/employed             Mobility Comments: no AD since Oct ADLs Comments: uses shower seat; helps with cleaning and cooking     Extremity/Trunk Assessment   Upper Extremity Assessment Upper Extremity Assessment: Defer to OT evaluation    Lower Extremity Assessment Lower Extremity Assessment: Generalized weakness    Cervical / Trunk Assessment Cervical / Trunk Assessment: Normal  Communication   Communication Communication: No apparent difficulties Cueing Techniques: Verbal cues  Cognition Arousal: Alert Behavior During Therapy: WFL for tasks assessed/performed Overall Cognitive Status: Impaired/Different from baseline Area of Impairment: Attention, Memory, Following commands, Problem solving                   Current Attention Level: Sustained Memory: Decreased short-term memory Following Commands: Follows one step commands consistently, Follows one step commands with increased time     Problem Solving: Slow processing, Decreased initiation, Requires verbal cues General Comments: requires nearly step-by-step cues for tasks; instructed to turn around and head back to room twice and then on 3rd time she finally did        General Comments General comments (skin integrity, edema, etc.): Mother present. BP supine 136/97,  standing 126/90, after ambulation 140/104    Exercises     Assessment/Plan    PT Assessment Patient needs continued PT services  PT Problem List Decreased strength;Decreased activity tolerance;Decreased balance;Decreased mobility;Decreased cognition;Decreased safety  awareness;Decreased knowledge of precautions       PT Treatment Interventions DME instruction;Stair training;Gait training;Functional mobility training;Therapeutic activities;Therapeutic exercise;Balance training;Cognitive remediation;Patient/family education    PT Goals (Current goals can be found in the Care Plan section)  Acute Rehab PT Goals Patient Stated Goal: none stated PT Goal Formulation: With patient Time For Goal Achievement: 03/12/23 Potential to Achieve Goals: Good    Frequency Min 3X/week     Co-evaluation               AM-PAC PT "6 Clicks" Mobility  Outcome Measure Help needed turning from your back to your side while in a flat bed without using bedrails?: None Help needed moving from lying on your back to sitting on the side of a flat bed without using bedrails?: A Little Help needed moving to and from a bed to a chair (including a wheelchair)?: A Little Help needed standing up from a chair using your arms (e.g., wheelchair or bedside chair)?: A Little Help needed to walk in hospital room?: A Little Help needed climbing 3-5 steps with a railing? : A Little 6 Click Score: 19    End of Session Equipment Utilized During Treatment: Gait belt Activity Tolerance: Patient limited by fatigue Patient left: in chair;with call bell/phone within reach;with family/visitor present (RN ok with no chair alarm) Nurse Communication: Mobility status;Other (comment) (mother is rehab aide and would like to help pt wash up, do exercises, and walk) PT Visit Diagnosis: Unsteadiness on feet (R26.81);Other abnormalities of gait and mobility (R26.89);Muscle weakness (generalized) (M62.81)    Time: 1610-9604  PT Time Calculation (min) (ACUTE ONLY): 41 min   Charges:   PT Evaluation $PT Eval Low Complexity: 1 Low PT Treatments $Gait Training: 23-37 mins PT General Charges $$ ACUTE PT VISIT: 1 Visit          Jerolyn Center, PT Acute Rehabilitation Services  Office  [phone]   Zena Amos 02/26/2023, 10:03 AM

## 2023-02-26 NOTE — Progress Notes (Signed)
Patient asleep right now, per agreement with mom, will come back before 12 midnight or when patient wakes up to give night medication as she just took xanax at 1800 and is still very sleepy.

## 2023-02-26 NOTE — Evaluation (Signed)
Occupational Therapy Evaluation Patient Details Name: Emily Hicks MRN: [id number] DOB: Jul 01, 1985 Today's Date: 02/26/2023   History of Present Illness 38 y.o. female who presented to [facility] ED on 1/20 with 2-week history of progressive generalized weakness, malaise, headache. Transferred to [facility] 1/20 for neurological work up. MRI reveals Multifocal regions of predominantly cortical diffusion restriction with associated contrast enhancement, involving the bilateral insular regions, right occipital lobe, and left cerebellum. EEG suggestive cortical dysfunction arising from right parieto-occipital likely secondary to underlying structural abnormality. 1/21 underwent LP  PMH: multiple sclerosis, lupus, GERD, cervical intraepithelial neoplasia, cholelithiasis   Clinical Impression   Emily Hicks was evaluated s/p the above admission list. She lives with family and is typically mod I-indep at baseline including working and driving. Upon evaluation the pt was limited by impaired cognition, L visual field deficits, BLE weakness with buckling, LUE weakness and paraesthesias and poor activity tolerance. Overall she needed CGA- min A for functional mobility with cues to avoid objects in the L environment. Due to the deficits listed below the pt also needs up to min A for ADLs with RW and cues for safety. Pt will benefit from continued acute OT services and OP OT.        If plan is discharge home, recommend the following: A little help with walking and/or transfers;A little help with bathing/dressing/bathroom;Assistance with cooking/housework;Direct supervision/assist for medications management;Direct supervision/assist for financial management;Assist for transportation;Help with stairs or ramp for entrance    Functional Status Assessment  Patient has had a recent decline in their functional status and demonstrates the ability to make significant improvements in function in a reasonable and predictable  amount of time.  Equipment Recommendations  None recommended by OT       Precautions / Restrictions Precautions Precautions: Fall Precaution Comments: left hemianopsia Restrictions Weight Bearing Restrictions Per Provider Order: No      Mobility Bed Mobility Overal bed mobility: Needs Assistance             General bed mobility comments: OOB upon arrvial    Transfers Overall transfer level: Needs assistance Equipment used: Rolling walker (2 wheels) Transfers: Sit to/from Stand Sit to Stand: Contact guard assist                  Balance Overall balance assessment: Needs assistance Sitting-balance support: No upper extremity supported, Feet supported Sitting balance-Leahy Scale: Good     Standing balance support: Single extremity supported, During functional activity Standing balance-Leahy Scale: Poor Standing balance comment: R knee buckling at the sink                           ADL either performed or assessed with clinical judgement   ADL Overall ADL's : Needs assistance/impaired Eating/Feeding: Independent   Grooming: Minimal assistance;Standing Grooming Details (indicate cue type and reason): cues for L scanning Upper Body Bathing: Set up;Sitting   Lower Body Bathing: Minimal assistance;Sit to/from stand   Upper Body Dressing : Set up;Sitting   Lower Body Dressing: Minimal assistance;Sit to/from stand   Toilet Transfer: Minimal assistance;Rolling walker (2 wheels)   Toileting- Clothing Manipulation and Hygiene: Supervision/safety;Sitting/lateral lean       Functional mobility during ADLs: Minimal assistance;Rolling walker (2 wheels) General ADL Comments: pt limited by generalized weakness, impaired cognition and L visual field deficits     Vision Baseline Vision/History: 1 Wears glasses Vision Assessment?: Yes Alignment/Gaze Preference: Gaze right (mild) Visual Fields: Left homonymous hemianopsia;Left visual  field  deficit Additional Comments: pt seemingly resticted in L visual field of both eyes     Perception Perception: Not tested       Praxis Praxis: Not tested       Pertinent Vitals/Pain Pain Assessment Pain Assessment: No/denies pain     Extremity/Trunk Assessment Upper Extremity Assessment Upper Extremity Assessment: LUE deficits/detail LUE Deficits / Details: globally weak but able to go through full ROM, mild paraesthesias throughout (pt reports chronic), slowed/deliberate FM coordination and dexterity) LUE Sensation: decreased light touch;decreased proprioception LUE Coordination: decreased fine motor   Lower Extremity Assessment Lower Extremity Assessment: Defer to PT evaluation   Cervical / Trunk Assessment Cervical / Trunk Assessment: Normal   Communication Communication Communication: No apparent difficulties Cueing Techniques: Verbal cues   Cognition Arousal: Alert Behavior During Therapy: WFL for tasks assessed/performed Overall Cognitive Status: Impaired/Different from baseline Area of Impairment: Attention, Safety/judgement, Awareness, Problem solving                   Current Attention Level: Sustained Memory: Decreased short-term memory Following Commands: Follows one step commands consistently, Follows multi-step commands inconsistently     Problem Solving: Slow processing, Decreased initiation, Requires verbal cues General Comments: able to initiate and sequence through simple ADL tasks [facility]. cues needed throughout for L environment scanning. flat affect noted throughout. 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 [employer]      Prior Functioning/Environment Prior Level of Function : Independent/Modified Independent;Driving;Working/employed             Mobility Comments: no AD since Oct ADLs Comments: uses shower seat; helps with cleaning and cooking        OT Problem List: Decreased range of motion;Decreased activity tolerance;Decreased strength;Impaired balance (sitting and/or standing);Impaired vision/perception;Decreased cognition;Decreased safety awareness;Decreased knowledge of use of DME or AE;Decreased knowledge of precautions      OT Treatment/Interventions: Self-care/ADL training;Therapeutic exercise;DME and/or AE instruction;Therapeutic activities;Patient/family education;Balance training    OT Goals(Current goals can be found in the care plan section) Acute Rehab OT Goals Patient Stated Goal: TO REST OT Goal Formulation: With patient Time For Goal Achievement: 03/12/23 Potential to Achieve Goals: Good ADL Goals Pt Will Perform Grooming: Independently;standing Pt Will Perform Lower Body Dressing: with modified independence;sit to/from stand Pt Will Transfer to Toilet: with modified independence Additional ADL Goal #1: Pt will indep utilize scanning strategies to attend to L environment during functional tasks Additional ADL Goal #2: Pt will tolerate at least 10 minutes of OOB activity as a precursor to improved activity tolerance for ADLs  OT Frequency: Min 1X/week       AM-PAC OT "6 Clicks" Daily Activity     Outcome Measure Help from another person eating meals?: None Help from another person taking care of personal grooming?: A Little Help from another person toileting, which includes using toliet, bedpan, or urinal?: A Little Help  from another person bathing (including washing, rinsing, drying)?: A Little  Help from another person to put on and taking off regular upper body clothing?: A Little Help from another person to put on and taking off regular lower body clothing?: A Little 6 Click Score: 19   End of Session Equipment Utilized During Treatment: Rolling walker (2 wheels) Nurse Communication: Mobility status  Activity Tolerance: Patient tolerated treatment well Patient left: in chair;with call bell/phone within reach;with family/visitor present  OT Visit Diagnosis: Unsteadiness on feet (R26.81);Other abnormalities of gait and mobility (R26.89);Muscle weakness (generalized) (M62.81);Low vision, both eyes (H54.2)                Time: 6578-4696 OT Time Calculation (min): 18 min Charges:  OT General Charges $OT Visit: 1 Visit OT Evaluation $OT Eval Moderate Complexity: 1 Mod  Derenda Mis, OTR/L Acute Rehabilitation Services Office [phone] Secure Chat Communication Preferred   Donia Pounds 02/26/2023, 12:09 PM

## 2023-02-26 NOTE — Progress Notes (Signed)
TRH night cross cover note:  I ordered blood cx's x 2 for objective fever of 101 F.     Newton Pigg, DO Hospitalist

## 2023-02-26 NOTE — Progress Notes (Signed)
Per radiology, lumbar puncture will be done tomorrow. Dr. Iver Nestle notified.

## 2023-02-26 NOTE — Progress Notes (Signed)
Progress Note   Patient: Emily Hicks ZOX:[id number] DOB: Jun 18, 1985 DOA: 02/23/2023     3 DOS: the patient was seen and examined on 02/26/2023   Brief hospital course: 38 y.o. female with past medical history significant for multiple sclerosis, lupus, GERD, cervical intraepithelial neoplasia, cholelithiasis who presented to [facility] ED on 1/28 with 2-week history of progressive generalized weakness, malaise, headache.  Also endorsed nasal congestion, nausea, nosebleeds.    Currently on Plaquenil and azathioprine for her lupus. Followed by neurology outpatient, Dr. Epimenio Foot but currently not on any additional disease modifying therapy for her MS currently.  Sister also endorses that she has been having a hard time "gripping" items.  She has been taking Excedrin without much relief regarding her headaches.  Denies any urinary symptoms, no visual changes, no fever, no chills, no rigors, no muscle aches.   In the ED, temperature 97.9 F, HR 107, RR 18, BP 175/119, SpO2 99% on room air.  WBC 5.1, hemoglobin 10.0, platelet count 287.  Sodium 142, potassium 3.8, chloride 112, CO2 22, glucose 90, BUN 12, creatinine 0.92.  Hemoglobin A1c 5.5.  hCG negative.  CT head without contrast with multifocal cerebral hypodensities including 5 cm edematous area right parietal lobe, indeterminate with considerations including subacute ischemic, neoplasm, infectious/inflammatory process, tumefactive demyelination.  Neurology was consulted and recommended transfer to [facility].  TRH was consulted for admission and patient was transferred to [facility].  Assessment and Plan: Brain lesions  Patient presenting with headache, progressive generalized weakness over the last few weeks associated with malaise.  Currently immunosuppressed from her lupus on azathioprine and Plaquenil.  CT head without contrast on admission with multifocal cerebral hypodensities including 5 cm edematous area right parietal lobe.  MRI  brain with and without contrast with multifocal regions of cortical diffusion restriction associated with contrast-enhancement bilateral insular regions, right occipital lobe, left cerebellum.  Imaging findings concerning for inflammatory vs CNS vasculitis vs CVA vs lupus cerebritis vs tumefactive MS vs infection.  CT angiogram head/neck with no large vessel occlusion, no stenosis or other acute vascular abnormality, no evidence for vasculitis, multiple prominent cervical, axillary, mediastinal lymph nodes, few scattered pulmonary nodules.  MR T/L-spine with T2 hyperintense foci cervical spine cord C4-5, C6/7 consistent with known MS, no abnormal enhancement to suggest active demyelination, no abnormal spinal cord signal or enhancement in the thoracic spine, epidural fluid collection thoracic spine likely from recent LP.  Underwent lumbar puncture 02/24/2023 by IR with initial fluid analysis not consistent with infection; meningitis/encephalitis panel negative.  EEG with generalized and maximal right parieto-occipital slowing suggestive of cortical dysfunction likely secondary to underlying structural normality, no seizure/epileptiform discharges noted. -- Neurology and infectious disease are following -- Toxoplasma Ab elevated> IgM 22.2, IgG 81.2; per ID not concerning for active infection given appearance of brain lesions on MRI -- RPR nonreactive -- CSF toxoplasma PCR: Pending -- CSF cytology: No malignant cells -- CSF oligoclonal bands: Pending -- CSF IgG: Pending -- CSF culture no organism on Gram stain; no growth -- CSF fungal culture: Neg -- CSF JC virus DNA PCR: Pending --CSF MTB -RIF NAA pending -- HIV antibody: NR -- anti-MOG testing: pending -- EBV pending --CMV IgG pending --Bartonella antibody pending,  --CK unremarkable -, aldolase pending --, LDH normal -- ESR 25, , CRP 0.6 --, C3/4 complement: Pending  - CT chest/abdomen/pelvis with contrast reviewed with scatterred tiny pulm  nodules up to 5mm and shotty B axillary lymph nodes -Have consulted IR regarding  possible biopsy. Pt and family agree with biopsy if needed   Pulmonary nodules Scattered pulmonary nodules noted incidentally on CT angiogram head/neck. --axillary nodes on CT noted. _ -IR consulted regarding possible biopsy   Lymphadenopathy CT angiogram head/neck with findings of multiple prominent cervical, axillary, mediastinal lymph nodes. -- IR consulted regarding possible biopsy   Hypokalemia Hypomagnesemia -within normal limits   History of multiple sclerosis, lupus Follows with neurology outpatient, Dr. Epimenio Foot -- Continue Plaquenil 200mg  PO BID, azathioprine    Essential hypertension -- Metoprolol succinate 25 mg p.o. daily -- Hydralazine 10 mg p.o. q6h PRN SBP >165   Vitamin D deficiency Vitamin D 25-hydroxy level 24.72.  Currently not taking ergocalciferol as reported at home. -- Cholecalciferol 1000 units p.o. daily   GERD -- Protonix 40 mg p.o. daily   Obesity, class III Body mass index is 36.84 kg/m.  Complicates all facets of care   Subjective: Without complaints. Initially unsure of biopsy, but later agrees to it  Physical Exam: Vitals:   02/26/23 0514 02/26/23 0754 02/26/23 1054 02/26/23 1610  BP: (!) 138/95 (!) 139/94 (!) 129/98 (!) 137/95  Pulse: (!) 106 (!) 103 97 99  Resp: 18 18 17 17   Temp: 99.9 F (37.7 C) 98.4 F (36.9 C) 97.9 F (36.6 C) 98.3 F (36.8 C)  TempSrc: Oral Oral Oral Oral  SpO2: 99% 99% 100% 100%  Weight:      Height:       General exam: Awake, laying in bed, in nad Respiratory system: Normal respiratory effort, no wheezing Cardiovascular system: regular rate, s1, s2 Gastrointestinal system: Soft, nondistended, positive BS Central nervous system: CN2-12 grossly intact, strength intact Extremities: Perfused, no clubbing Skin: Normal skin turgor, no notable skin lesions seen Psychiatry: Mood normal // no visual hallucinations   Data  Reviewed:  Labs reviewed: Na 138, K 3.6, Cr 0.89, , WBC 4.9, Hgb 9.7, Plts 289  Family Communication: Pt in room, family at bedside  Disposition: Status is: Inpatient Remains inpatient appropriate because: Severity of illness  Planned Discharge Destination: Home    Author: Rickey Barbara, MD 02/26/2023 5:42 PM  For on call review [email or web address].

## 2023-02-27 ENCOUNTER — Inpatient Hospital Stay (HOSPITAL_COMMUNITY): Payer: 59

## 2023-02-27 DIAGNOSIS — G939 Disorder of brain, unspecified: Secondary | ICD-10-CM | POA: Diagnosis not present

## 2023-02-27 DIAGNOSIS — R519 Headache, unspecified: Secondary | ICD-10-CM | POA: Diagnosis not present

## 2023-02-27 DIAGNOSIS — G35 Multiple sclerosis: Secondary | ICD-10-CM | POA: Diagnosis not present

## 2023-02-27 LAB — EPSTEIN-BARR VIRUS (EBV) ANTIBODY PROFILE
EBV NA IgG: 288 U/mL — ABNORMAL HIGH (ref 0.0–17.9)
EBV VCA IgG: >600 U/mL — ABNORMAL HIGH (ref 0.0–17.9)
EBV VCA IgM: <36 U/mL (ref 0.0–35.9)

## 2023-02-27 LAB — COMPREHENSIVE METABOLIC PANEL
ALT: 7 U/L (ref 0–44)
AST: 13 U/L — ABNORMAL LOW (ref 15–41)
Albumin: 2.4 g/dL — ABNORMAL LOW (ref 3.5–5.0)
Alkaline Phosphatase: 41 U/L (ref 38–126)
Anion gap: 8 (ref 5–15)
BUN: 9 mg/dL (ref 6–20)
CO2: 21 mmol/L — ABNORMAL LOW (ref 22–32)
Calcium: 8 mg/dL — ABNORMAL LOW (ref 8.9–10.3)
Chloride: 113 mmol/L — ABNORMAL HIGH (ref 98–111)
Creatinine, Ser: 1.03 mg/dL — ABNORMAL HIGH (ref 0.44–1.00)
GFR, Estimated: >60 mL/min (ref >60)
Glucose, Bld: 89 mg/dL (ref 70–99)
Potassium: 3.7 mmol/L (ref 3.5–5.1)
Sodium: 142 mmol/L (ref 135–145)
Total Bilirubin: 0.6 mg/dL (ref 0.0–1.2)
Total Protein: 5.8 g/dL — ABNORMAL LOW (ref 6.5–8.1)

## 2023-02-27 LAB — CBC
HCT: 29.6 % — ABNORMAL LOW (ref 36.0–46.0)
Hemoglobin: 9.5 g/dL — ABNORMAL LOW (ref 12.0–15.0)
MCH: 27.5 pg (ref 26.0–34.0)
MCHC: 32.1 g/dL (ref 30.0–36.0)
MCV: 85.8 fL (ref 80.0–100.0)
Platelets: 263 10*3/uL (ref 150–400)
RBC: 3.45 MIL/uL — ABNORMAL LOW (ref 3.87–5.11)
RDW: 13.6 % (ref 11.5–15.5)
WBC: 4.6 10*3/uL (ref 4.0–10.5)
nRBC: 0 % (ref 0.0–0.2)

## 2023-02-27 LAB — CSF CULTURE W GRAM STAIN: Culture: NO GROWTH

## 2023-02-27 LAB — PROTIME-INR
INR: 1 (ref 0.8–1.2)
Prothrombin Time: 13.7 s (ref 11.4–15.2)

## 2023-02-27 LAB — BARTONELLA ANTIBODY PANEL
B Quintana IgM: NEGATIVE {titer}
B henselae IgG: NEGATIVE {titer}
B henselae IgM: NEGATIVE {titer}
B quintana IgG: NEGATIVE {titer}

## 2023-02-27 LAB — CMV IGM: CMV IgM: 165 [AU]/ml — ABNORMAL HIGH (ref 0.0–29.9)

## 2023-02-27 LAB — GLUCOSE, CAPILLARY: Glucose-Capillary: 148 mg/dL — ABNORMAL HIGH (ref 70–99)

## 2023-02-27 LAB — C3 COMPLEMENT: C3 Complement: 51 mg/dL — ABNORMAL LOW (ref 82–167)

## 2023-02-27 LAB — C4 COMPLEMENT: Complement C4, Body Fluid: 5 mg/dL — ABNORMAL LOW (ref 12–38)

## 2023-02-27 LAB — CMV ANTIBODY, IGG (EIA): CMV Ab - IgG: >10 U/mL — ABNORMAL HIGH (ref 0.00–0.59)

## 2023-02-27 LAB — JC VIRUS DNA,PCR (WHOLE BLOOD): JC Virus DNA, PCR, Blood: NEGATIVE

## 2023-02-27 LAB — ALDOLASE: Aldolase: 2 U/L — ABNORMAL LOW (ref 3.3–10.3)

## 2023-02-27 MED ORDER — SODIUM CHLORIDE 0.9 % IV SOLN: 1000.0000 mg | INTRAVENOUS | Status: DC

## 2023-02-27 MED ORDER — SODIUM CHLORIDE 0.9 % IV SOLN
1000.0000 mg | INTRAVENOUS | Status: AC
  Administered 2023-02-27: 1000 mg via INTRAVENOUS
  Filled 2023-02-27: qty 16

## 2023-02-27 MED ORDER — MIDAZOLAM HCL 2 MG/2ML IJ SOLN
INTRAMUSCULAR | Status: AC
  Filled 2023-02-27: qty 2

## 2023-02-27 MED ORDER — SODIUM CHLORIDE 0.9 % IV SOLN
1000.0000 mg | INTRAVENOUS | Status: AC
  Administered 2023-02-28: 1000 mg via INTRAVENOUS
  Filled 2023-02-27: qty 16

## 2023-02-27 MED ORDER — ENOXAPARIN SODIUM 40 MG/0.4ML IJ SOSY
40.0000 mg | PREFILLED_SYRINGE | INTRAMUSCULAR | Status: DC
  Administered 2023-02-28 – 2023-03-03 (×4): 40 mg via SUBCUTANEOUS
  Filled 2023-02-27 (×4): qty 0.4

## 2023-02-27 MED ORDER — SODIUM CHLORIDE 0.9 % IV SOLN
1000.0000 mg | INTRAVENOUS | Status: AC
  Administered 2023-03-02 – 2023-03-03 (×2): 1000 mg via INTRAVENOUS
  Filled 2023-02-27 (×2): qty 16

## 2023-02-27 MED ORDER — FENTANYL CITRATE (PF) 100 MCG/2ML IJ SOLN
INTRAMUSCULAR | Status: AC | PRN
  Administered 2023-02-27: 50 ug via INTRAVENOUS
  Administered 2023-02-27: 25 ug via INTRAVENOUS

## 2023-02-27 MED ORDER — LIDOCAINE-EPINEPHRINE 1 %-1:100000 IJ SOLN
10.0000 mL | Freq: Once | INTRAMUSCULAR | Status: AC
  Administered 2023-02-27: 10 mL via INTRADERMAL

## 2023-02-27 MED ORDER — FENTANYL CITRATE (PF) 100 MCG/2ML IJ SOLN
INTRAMUSCULAR | Status: AC
  Filled 2023-02-27: qty 2

## 2023-02-27 MED ORDER — MIDAZOLAM HCL 2 MG/2ML IJ SOLN
INTRAMUSCULAR | Status: AC | PRN
  Administered 2023-02-27: 1 mg via INTRAVENOUS
  Administered 2023-02-27: .5 mg via INTRAVENOUS

## 2023-02-27 MED ORDER — INSULIN ASPART 100 UNIT/ML IJ SOLN
0.0000 [IU] | Freq: Three times a day (TID) | INTRAMUSCULAR | Status: DC
  Administered 2023-02-28 – 2023-03-01 (×5): 1 [IU] via SUBCUTANEOUS

## 2023-02-27 MED ORDER — SODIUM CHLORIDE 0.9 % IV SOLN
1000.0000 mg | INTRAVENOUS | Status: AC
  Administered 2023-03-01: 1000 mg via INTRAVENOUS
  Filled 2023-02-27: qty 16

## 2023-02-27 NOTE — Plan of Care (Signed)
Problem: Pain Managment: Goal: General experience of comfort will improve and/or be controlled Outcome: Progressing

## 2023-02-27 NOTE — Progress Notes (Signed)
Progress Note   Patient: Emily Hicks:[id number] DOB: January 15, 1986 DOA: 02/23/2023     4 DOS: the patient was seen and examined on 02/27/2023   Brief hospital course: 38 y.o. female with past medical history significant for multiple sclerosis, lupus, GERD, cervical intraepithelial neoplasia, cholelithiasis who presented to [facility] ED on 1/28 with 2-week history of progressive generalized weakness, malaise, headache.  Also endorsed nasal congestion, nausea, nosebleeds.    Currently on Plaquenil and azathioprine for her lupus. Followed by neurology outpatient, Dr. Epimenio Foot but currently not on any additional disease modifying therapy for her MS currently.  Sister also endorses that she has been having a hard time "gripping" items.  She has been taking Excedrin without much relief regarding her headaches.  Denies any urinary symptoms, no visual changes, no fever, no chills, no rigors, no muscle aches.   In the ED, temperature 97.9 F, HR 107, RR 18, BP 175/119, SpO2 99% on room air.  WBC 5.1, hemoglobin 10.0, platelet count 287.  Sodium 142, potassium 3.8, chloride 112, CO2 22, glucose 90, BUN 12, creatinine 0.92.  Hemoglobin A1c 5.5.  hCG negative.  CT head without contrast with multifocal cerebral hypodensities including 5 cm edematous area right parietal lobe, indeterminate with considerations including subacute ischemic, neoplasm, infectious/inflammatory process, tumefactive demyelination.  Neurology was consulted and recommended transfer to [facility].  TRH was consulted for admission and patient was transferred to [facility].  Assessment and Plan: Brain lesions  Patient presenting with headache, progressive generalized weakness over the last few weeks associated with malaise.  Currently immunosuppressed from her lupus on azathioprine and Plaquenil.  CT head without contrast on admission with multifocal cerebral hypodensities including 5 cm edematous area right parietal lobe.  MRI  brain with and without contrast with multifocal regions of cortical diffusion restriction associated with contrast-enhancement bilateral insular regions, right occipital lobe, left cerebellum.  Imaging findings concerning for inflammatory vs CNS vasculitis vs CVA vs lupus cerebritis vs tumefactive MS vs infection.  CT angiogram head/neck with no large vessel occlusion, no stenosis or other acute vascular abnormality, no evidence for vasculitis, multiple prominent cervical, axillary, mediastinal lymph nodes, few scattered pulmonary nodules.  MR T/L-spine with T2 hyperintense foci cervical spine cord C4-5, C6/7 consistent with known MS, no abnormal enhancement to suggest active demyelination, no abnormal spinal cord signal or enhancement in the thoracic spine, epidural fluid collection thoracic spine likely from recent LP.  Underwent lumbar puncture 02/24/2023 by IR with initial fluid analysis not consistent with infection; meningitis/encephalitis panel negative.  EEG with generalized and maximal right parieto-occipital slowing suggestive of cortical dysfunction likely secondary to underlying structural normality, no seizure/epileptiform discharges noted. -- Neurology and infectious disease are following -- Toxoplasma Ab elevated> IgM 22.2, IgG 81.2; per ID not concerning for active infection given appearance of brain lesions on MRI -- RPR nonreactive -- CSF toxoplasma PCR: Pending -- CSF cytology: No malignant cells -- CSF oligoclonal bands: Pending -- CSF IgG: Pending -- CSF culture no organism on Gram stain; no growth -- CSF fungal culture: Neg -- CSF JC virus DNA PCR: Pending --CSF MTB -RIF NAA pending -- HIV antibody: NR -- anti-MOG testing: pending -- EBV pending --CMV serologies were pos. Per ID, difficult to destinguish if a true pos --Bartonella antibody pending,  --CK unremarkable -, aldolase pending --, LDH normal -- ESR 25, , CRP 0.6 --, C3/4 complement: Pending  - CT  chest/abdomen/pelvis with contrast reviewed with scatterred tiny pulm nodules up to 5mm  and shotty B axillary lymph nodes -Pt now s/p axillary node biopsy, pending -Repeat LP noted per Neuro   Pulmonary nodules Scattered pulmonary nodules noted incidentally on CT angiogram head/neck. --axillary nodes on CT noted. _ -Pt now s/p axillary node biopsy, pending results   Lymphadenopathy CT angiogram head/neck with findings of multiple prominent cervical, axillary, mediastinal lymph nodes. -- IR was consulted for node biopsy   Hypokalemia Hypomagnesemia -within normal limits   History of multiple sclerosis, lupus Follows with neurology outpatient, Dr. Epimenio Foot -- Continue Plaquenil 200mg  PO BID, azathioprine    Essential hypertension -- Metoprolol succinate 25 mg p.o. daily -- Hydralazine 10 mg p.o. q6h PRN SBP >165   Vitamin D deficiency Vitamin D 25-hydroxy level 24.72.  Currently not taking ergocalciferol as reported at home. -- Cholecalciferol 1000 units p.o. daily   GERD -- Protonix 40 mg p.o. daily   Obesity, class III Body mass index is 36.84 kg/m.  Complicates all facets of care   Subjective: Pt seen after return from procedure. Tired and sleepy  Physical Exam: Vitals:   02/27/23 1155 02/27/23 1213 02/27/23 1351 02/27/23 1427  BP: 124/86 133/89 (!) 141/102 (!) 140/102  Pulse: 95 92 90 87  Resp: 14 14 16 16   Temp:  98.3 F (36.8 C) 97.9 F (36.6 C)   TempSrc:  Oral Oral   SpO2: 95% 98% 100% 98%  Weight:      Height:       General exam: Asleep, in no acute distress Respiratory system: normal chest rise, clear, no audible wheezing Cardiovascular system: regular rhythm, s1-s2 Gastrointestinal system: Nondistended, nontender, pos BS Central nervous system: No seizures, no tremors Extremities: No cyanosis, no joint deformities Skin: No rashes, no pallor Psychiatry: difficult to assess given mentation  Data Reviewed:  Labs reviewed: Na 142, K 3.7, Cr 1.03, WBC  4.6, Hgb 9.5, Plts 263  Family Communication: Pt in room, family at bedside  Disposition: Status is: Inpatient Remains inpatient appropriate because: Severity of illness  Planned Discharge Destination: Home    Author: Rickey Barbara, MD 02/27/2023 3:34 PM  For on call review [email or web address].

## 2023-02-27 NOTE — Progress Notes (Signed)
PHARMACY - ANTICOAGULATION CONSULT NOTE  Pharmacy Consult for lovenox vte ppx Indication: VTE prophylaxis  No Known Allergies  Patient Measurements: Height: 5\' 1"  (154.9 cm) Weight: 87.7 kg (193 lb 4.8 oz) IBW/kg (Calculated) : 47.8   Vital Signs: Temp: 98.3 F (36.8 C) (01/24 1213) Temp Source: Oral (01/24 1213) BP: 133/89 (01/24 1213) Pulse Rate: 92 (01/24 1213)  Labs: Recent Labs    02/25/23 0549 02/26/23 0647 02/27/23 0622  HGB  --  9.7* 9.5*  HCT  --  30.1* 29.6*  PLT  --  289 263  LABPROT  --   --  13.7  INR  --   --  1.0  CREATININE 0.82 0.89 1.03*  CKTOTAL  --  28*  --     Estimated Creatinine Clearance: 76.1 mL/min (A) (by C-G formula based on SCr of 1.03 mg/dL (H)).   Medical History: Past Medical History:  Diagnosis Date   Cervical intraepithelial neoplasia III    Gallstones    GERD (gastroesophageal reflux disease)    only prn OTC occasionally   History of blood transfusion 2020   Lupus dx 2020   Multiple sclerosis (HCC) dx 2020   Numbness and tingling of left leg    Assessment: 37 YOM post IR procedure with standard bleed risk  Goal of Therapy:  Monitor platelets by anticoagulation protocol: Yes   Plan:  Restart Lovenox 40 mg [place] qday AM 1/25 (day after IR procedure) Monitor for signs of bleeding Pharmacy will sign off consult Thank you  Greta Doom BS, PharmD, BCPS Clinical Pharmacist 02/27/2023 1:21 PM  Contact: [phone] after 3 PM  "Be curious, not judgmental..." -Debbora Dus

## 2023-02-27 NOTE — Consult Note (Signed)
Chief Complaint: Lymphadenopathy. Request is for lymph node biopsy  Referring Physician(s): Dr. Rickey Barbara  Supervising Physician: Simonne Come  Patient Status: [facility] - In-pt  History of Present Illness: Emily Hicks is a 38 y.o. female with a history of multiple sclerosis, lupus, GERD, cervical intraepithelial neoplasia, cholelithiasis who presented to [facility] ED on 1/20 with 2-week history of progressive generalized weakness, malaise, headache. Previously underwent lumbar puncture on 02/24/23 with our team. CTA head/neck on 02/25/23 notable for multiple prominent cervical, axillary, and mediastinal lymph nodes as well as few scattered pulmonary nodules measuring up to 6mm. Hospitalist now requesting lymph node biopsy for further evaluation.   Currently laying in bed with mother at bedside. Patient appears uncomfortable, but is without any new complaints. Admits to persistent nausea and mild headache since admission. Denies any fever, chest pain, SOB, or abdominal pain.   Albumin 2.4, AST 13. Total protein 5.8 . Patient is on subcutaneous prophylactic dose of lovenox.  Last doe on 1.22.24. NKDA. Patient has been NPO since midnight.   Return precautions and treatment recommendations and follow-up discussed with the patient and mother who are agreeable with the plan.   Past Medical History:  Diagnosis Date   Cervical intraepithelial neoplasia III    Gallstones    GERD (gastroesophageal reflux disease)    only prn OTC occasionally   History of blood transfusion 2020   Lupus dx 2020   Multiple sclerosis (HCC) dx 2020   Numbness and tingling of left leg     Past Surgical History:  Procedure Laterality Date   CERVICAL CONIZATION W/BX N/A 01/16/2020   Procedure: CERVICAL CONE BIOPSY, ENDOCERVICAL CURETTAGE;  Surgeon: Theresia Majors, MD;  Location: [facility];  Service: Gynecology;  Laterality: N/A;  request 7:30am OR start in [place] [facility]  block requests 45 minutes   CHOLECYSTECTOMY N/A 12/28/2013   Procedure: LAPAROSCOPIC CHOLECYSTECTOMY;  Surgeon: Claud Kelp, MD;  Location: [facility];  Service: General;  Laterality: N/A;   MUSCLE BIOPSY Right 11/06/2022   Procedure: RIGHT THIGH MUSCLE BIOPSY;  Surgeon: Andria Meuse, MD;  Location: [facility] OR;  Service: General;  Laterality: Right;    Allergies: Patient has no known allergies.  Medications: Prior to Admission medications   Medication Sig Start Date End Date Taking? Authorizing Provider  aspirin-acetaminophen-caffeine (EXCEDRIN MIGRAINE) [phone] MG tablet Take 2 tablets by mouth every 6 (six) hours as needed for headache or migraine.   Yes [provider]  azathioprine (IMURAN) 100 MG tablet Take 150 mg by mouth daily.   Yes [provider]  clobetasol ointment (TEMOVATE) 0.05 % Apply 1 application  topically daily as needed (rash). 06/04/21  Yes [provider]  gabapentin (NEURONTIN) 300 MG capsule Take 1 capsule (300 mg total) by mouth 3 (three) times daily. Patient taking differently: Take 300 mg by mouth daily as needed (nerve pain). 08/20/22  Yes Sater, Pearletha Furl, MD  hydroxychloroquine (PLAQUENIL) 200 MG tablet Take 200 mg by mouth 2 (two) times daily.   Yes [provider]  ketoconazole (NIZORAL) 2 % shampoo Apply 1 Application topically once a week.   Yes [provider]  TYLENOL 325 MG CAPS Take 650 mg by mouth every 6 (six) hours as needed (for headaches or pain).   Yes [provider]  metoprolol succinate (TOPROL XL) 25 MG 24 hr tablet Take 1 tablet (25 mg total) by mouth at bedtime. Patient not taking: Reported on 11/04/2022 08/04/22   Christell Constant, MD  pantoprazole (PROTONIX) 40 MG tablet Take 1 tablet (40 mg total) by mouth daily. Patient not taking: Reported on 12/11/2022 11/10/22   Almon Hercules, MD  Vitamin D, Ergocalciferol, (DRISDOL) 1.25 MG (50000 UNIT) CAPS capsule Take 1 capsule (50,000  Units total) by mouth every 7 (seven) days. Patient not taking: Reported on 11/04/2022 08/16/21   Marcine Matar, MD     Family History  Problem Relation Age of Onset   Hypertension Father    Hypertension Mother    Hypertension Sister    Diabetes Maternal Grandmother    Diabetes Maternal Grandfather     Social History   Socioeconomic History   Marital status: Married    Spouse name: Not on file   Number of children: 1   Years of education: 16   Highest education level: Bachelor's degree (e.g., BA, AB, BS)  Occupational History   Occupation: Accounting  Tobacco Use   Smoking status: Never   Smokeless tobacco: Never  Vaping Use   Vaping status: Never Used  Substance and Sexual Activity   Alcohol use: No   Drug use: No   Sexual activity: Yes    Birth control/protection: None    Comment: 1st intercourse 38 yo-Fewer than 5 partners  Other Topics Concern   Not on file  Social History Narrative   Lives at home with husband and son.   Right-handed   Drinks some Tea   Social Drivers of Corporate investment banker Strain: Not on file  Food Insecurity: No Food Insecurity (02/23/2023)   Hunger Vital Sign    Worried About Running Out of Food in the Last Year: Never true    Ran Out of Food in the Last Year: Never true  Transportation Needs: No Transportation Needs (02/23/2023)   PRAPARE - Administrator, Civil Service (Medical): No    Lack of Transportation (Non-Medical): No  Physical Activity: Not on file  Stress: Not on file  Social Connections: Not on file    Review of Systems  Constitutional:  Negative for chills and fever.  Gastrointestinal:  Positive for nausea. Negative for abdominal pain.  Neurological:  Positive for headaches.    Vital Signs: BP (!) 152/99 (BP Location: Right Arm)   Pulse (!) 103   Temp 99.4 F (37.4 C) (Oral)   Resp 18   Ht 5\' 1"  (1.549 m)   Wt 193 lb 4.8 oz (87.7 kg)   LMP 01/23/2023   SpO2 100%   BMI 36.52 kg/m     Physical Exam Vitals reviewed.  Constitutional:      Appearance: Normal appearance.  HENT:     Head: Normocephalic and atraumatic.     Mouth/Throat:     Mouth: Mucous membranes are moist.     Pharynx: Oropharynx is clear.  Neck:     Comments: Decreased ROM d/t headache Cardiovascular:     Rate and Rhythm: Regular rhythm. Tachycardia present.     Heart sounds: Normal heart sounds.  Pulmonary:     Effort: Pulmonary effort is normal.     Breath sounds: Normal breath sounds.  Abdominal:     General: Abdomen is flat.     Palpations: Abdomen is soft.  Musculoskeletal:        General: Normal range of motion.  Skin:    General: Skin is warm and dry.  Neurological:     General: No focal deficit present.     Mental Status: She is alert and oriented to person,  place, and time. Mental status is at baseline.  Psychiatric:        Mood and Affect: Mood normal.        Behavior: Behavior normal.        Judgment: Judgment normal.     Imaging: MR BRAIN W WO CONTRAST Result Date: 02/26/2023 CLINICAL DATA:  Initial evaluation for brain/CNS neoplasm, worsening nausea and vomiting. EXAM: MRI HEAD WITHOUT AND WITH CONTRAST TECHNIQUE: Multiplanar, multiecho pulse sequences of the brain and surrounding structures were obtained without and with intravenous contrast. CONTRAST:  7mL GADAVIST GADOBUTROL 1 MMOL/ML IV SOLN COMPARISON:  Prior MRI from 02/23/2023 as well as additional exams. FINDINGS: Brain: Again seen are multifocal areas of scattered T2/FLAIR signal abnormality involving the cortical and subcortical aspects of the bilateral cerebral and cerebellar hemispheres. Overall number and distribution of these lesions is not significantly changed from prior. However, the size and degree of associated FLAIR signal abnormality appears somewhat progressed and worsened. For example, the dominant lesion at the right occipital lobe now measures 5.0 x 3.4 x 5.2 cm, previously 4.8 x 3.4 x 4.3 cm on  02/23/2023. The additional lesions are also slightly more prominent and slightly increased in size, with particular note made of increased prominence of a lesion involving the central superior cerebellar vermis (series 11, image 19). Increased mass effect by the right occipital lesion on the posterior aspect of the right lateral ventricle since prior (series 11, image 29). Trace right-to-left shift is now seen at the septum pellucidum, new from prior. Associated petechial blood products about several of these lesions is similar. Irregular heterogeneous postcontrast enhancement about these lesions is similar to perhaps slightly more prominent as well. No new lesions identified. The remainder of the brain is otherwise stable in appearance. No other acute or subacute infarct. Underlying mild nonspecific cerebral white matter disease noted, stable. No other abnormal enhancement. Vascular: Major intracranial vascular flow voids are maintained. Skull and upper cervical spine: Craniocervical junction within normal limits. Decreased T1 signal intensity within the visualized bone marrow, nonspecific, but most commonly related to anemia, smoking or obesity. No scalp soft tissue abnormality. Sinuses/Orbits: Globes orbital soft tissues within normal limits. Left sphenoid sinus retention cyst, with scattered mucosal thickening about the ethmoidal air cells and maxillary sinuses. No significant mastoid effusion. Other: None. IMPRESSION: 1. Multifocal areas of T2/FLAIR signal abnormality involving the cortical and subcortical aspects of the bilateral cerebral and cerebellar hemispheres, stable in number and distribution from prior, but overall slightly more prominent with increased size and progressive T2/FLAIR signal abnormality. Again, differential considerations are broad, and described on previous exams. 2. Increased mass effect by the dominant right occipital lesion on the posterior aspect of the right lateral ventricle, with  new trace right-to-left shift at the septum pellucidum. Electronically Signed   By: Rise Mu M.D.   On: 02/26/2023 21:03   CT CHEST ABDOMEN PELVIS W CONTRAST Result Date: 02/25/2023 CLINICAL DATA:  Immunosuppression on azathioprine, metastatic disease of unknown primary, brain metastases EXAM: CT CHEST, ABDOMEN, AND PELVIS WITH CONTRAST TECHNIQUE: Multidetector CT imaging of the chest, abdomen and pelvis was performed following the standard protocol during bolus administration of intravenous contrast. RADIATION DOSE REDUCTION: This exam was performed according to the departmental dose-optimization program which includes automated exposure control, adjustment of the mA and/or kV according to patient size and/or use of iterative reconstruction technique. CONTRAST:  75mL OMNIPAQUE IOHEXOL 350 MG/ML SOLN COMPARISON:  None Available. FINDINGS: CT CHEST FINDINGS Cardiovascular: No significant vascular findings. Normal heart  size. No pericardial effusion. Mediastinum/Nodes: There is shotty bilateral axillary adenopathy with the index lymph node measuring up to 13 mm in short axis diameter within the left axilla best seen on axial image # 10/3. No frankly pathologic thoracic adenopathy, however, is identified. Visualized thyroid is unremarkable. Esophagus is unremarkable. Small hiatal hernia. Lungs/Pleura: Small bilateral pleural effusions with associated bibasilar atelectasis. Scattered tiny pulmonary nodules are seen randomly distributed throughout the lungs bilaterally measuring up to 5 mm within the superior segment of the left lower lobe at axial image # 45/5, nonspecific. These may reflect the sequela of remote infection or inflammation though pulmonary metastatic disease is not excluded. No pneumothorax. No central obstructing lesion. Musculoskeletal: No acute bone abnormality. No lytic or blastic bone lesion. CT ABDOMEN PELVIS FINDINGS Hepatobiliary: Moderate hepatic steatosis. No enhancing  intrahepatic mass. Status post cholecystectomy. No intra or extrahepatic biliary ductal dilation peer Pancreas: Unremarkable Spleen: Unremarkable Adrenals/Urinary Tract: Adrenal glands are unremarkable. Kidneys are normal, without renal calculi, focal lesion, or hydronephrosis. Bladder is unremarkable. Stomach/Bowel: Stomach is within normal limits. Appendix appears normal. No evidence of bowel wall thickening, distention, or inflammatory changes. Vascular/Lymphatic: Note is made of a retroaortic left renal vein. The abdominal vasculature is otherwise unremarkable. There is shotty bilateral external iliac lymphadenopathy with the index lymph node measuring up to 12 mm in short axis diameter at axial image # 103/3. No frankly pathologic adenopathy seen within the abdomen and pelvis. Reproductive: Uterus and bilateral adnexa are unremarkable. Other: No abdominal wall hernia Musculoskeletal: No lytic or blastic bone lesion. No acute bone abnormality. T11 hemivertebra noted. IMPRESSION: 1. Small bilateral pleural effusions with associated bibasilar atelectasis. 2. Scattered tiny pulmonary nodules measuring up to 5 mm, nonspecific. While pulmonary metastatic disease is not strictly excluded, these are more commonly the result of remote infection or inflammation in a patient of this age. 3. Shotty bilateral axillary and external iliac lymphadenopathy, nonspecific. No frankly pathologic adenopathy seen within the chest, abdomen, or pelvis. 4. Moderate hepatic steatosis. 5. Small hiatal hernia. Electronically Signed   By: Helyn Numbers M.D.   On: 02/25/2023 19:57   CT HEAD WO CONTRAST ( ) Result Date: 02/25/2023 CLINICAL DATA:  Brain/CNS neoplasm, monitor. Worsening headache, vomiting. EXAM: CT HEAD WITHOUT CONTRAST TECHNIQUE: Contiguous axial images were obtained from the base of the skull through the vertex without intravenous contrast. RADIATION DOSE REDUCTION: This exam was performed according to the departmental  dose-optimization program which includes automated exposure control, adjustment of the mA and/or kV according to patient size and/or use of iterative reconstruction technique. COMPARISON:  Non-contrast head CT and CT angiogram head/neck 02/25/2023. Brain MRI 02/23/2023. Head CT 02/23/2023. FINDINGS: Brain: No age-advanced or lobar predominant parenchymal atrophy. Previously demonstrated foci of abnormal hypodensity within the bilateral cerebral and cerebellar hemispheres have not appreciably changed from the head CT performed earlier today at 12:47 a.m. As before, the largest lesion is located within the right parietal and occipital lobes and there is associated mass effect at this site, as well as subtle petechial blood products. Unchanged effacement of the right lateral ventricle posteriorly. No midline shift. No extra-axial fluid collection. Vascular: No hyperdense vessel. Skull: No calvarial fracture or aggressive osseous lesion. Sinuses/Orbits: No mass or acute finding within the imaged orbits. Mild mucosal thickening within the right maxillary sinus. Moderate polypoid mucosal thickening within the left maxillary sinus. 9 mm left sphenoid sinus mucous retention cyst. Minimal mucosal thickening within the right sphenoid and left ethmoid sinuses. IMPRESSION: 1. Stable non-contrast CT appearance of  the brain as compared to the examination performed earlier today at 12:47 a.m. Multiple foci of abnormal hypodensity within the bilateral cerebral and cerebellar hemispheres have not significantly changed. Mass effect and subtle petechial blood products associated with a dominant lesion within the right parietal and right occipital lobes. Findings are nonspecific with broad differential considerations including sequela of vasculitis, an infectious/inflammatory process, neoplasm/lymphoma, tumefactive demyelination/other autoimmune process and/or subacute ischemia. 2. Paranasal sinus disease as described. Electronically  Signed   By: Jackey Loge D.O.   On: 02/25/2023 18:17   CT ANGIO HEAD NECK W WO CM Result Date: 02/25/2023 CLINICAL DATA:  Initial evaluation for vasculitis. EXAM: CT ANGIOGRAPHY HEAD AND NECK WITH AND WITHOUT CONTRAST TECHNIQUE: Multidetector CT imaging of the head and neck was performed using the standard protocol during bolus administration of intravenous contrast. Multiplanar CT image reconstructions and MIPs were obtained to evaluate the vascular anatomy. Carotid stenosis measurements (when applicable) are obtained utilizing NASCET criteria, using the distal internal carotid diameter as the denominator. RADIATION DOSE REDUCTION: This exam was performed according to the departmental dose-optimization program which includes automated exposure control, adjustment of the mA and/or kV according to patient size and/or use of iterative reconstruction technique. CONTRAST:  75mL OMNIPAQUE IOHEXOL 350 MG/ML SOLN COMPARISON:  Prior CT and MRI from 02/23/2023 FINDINGS: CT HEAD FINDINGS Brain: Previously identified hypodense areas involving the right occipital lobe, posterior left insular region, right frontotemporal region, and right greater than left cerebellum noted. Associated petechial blood products about the dominant lesion at the right occipital lobe. Overall, appearance is relatively stable from prior exams. No appreciable new lesions. No other acute large vessel territory infarct. No other acute intracranial hemorrhage. No midline shift or hydrocephalus. No extra-axial fluid collection. Vascular: No abnormal hyperdense vessel. Skull: Scalp soft tissues and calvarium demonstrate no new finding. Sinuses/Orbits: Globes and orbital soft tissues within normal limits. Mild mucosal thickening noted about the maxillary sinuses bilaterally. Left sphenoid sinus retention cyst. No mastoid effusion. Other: None. Review of the MIP images confirms the above findings CTA NECK FINDINGS Aortic arch: Standard branching. Imaged  portion shows no evidence of aneurysm or dissection. No significant stenosis of the major arch vessel origins. Right carotid system: No evidence of dissection, stenosis (50% or greater), or occlusion. No beading or irregularity. Left carotid system: No evidence of dissection, stenosis (50% or greater), or occlusion. No beading or irregularity. Vertebral arteries: Proximal left vertebral artery not well visualized due to adjacent venous contamination. Visualized vertebral arteries patent without dissection or stenosis. No beading or irregularity. Skeleton: No discrete or worrisome osseous lesions. Other neck: Multiple prominent and hyperdense but not technically enlarged lymph nodes seen within the neck. No other acute finding. Upper chest: Prominent and hyperdense bilateral axillary lymph nodes, measuring up to 1.5 cm bilaterally. Prominent pre-vascular nodes measure up to 9 mm, partially visualized. Few scatter pulmonary nodule seen within the visualized lungs, largest of which measures 6 mm within the left upper lobe (series 10, image 153). Review of the MIP images confirms the above findings CTA HEAD FINDINGS Anterior circulation: Both internal carotid arteries are widely patent to the siphons without stenosis or other abnormality. A1 segments patent bilaterally. Normal anterior communicating artery complex. Both ACAs widely patent without stenosis. No M1 stenosis or occlusion. Distal MCA branches perfused and symmetric. No beading or irregularity. Posterior circulation: Both V4 segments widely patent without stenosis. Both PICA patent. Basilar widely patent without stenosis. Superior cerebellar and posterior cerebral arteries patent bilaterally. No beading or irregularity.  Venous sinuses: Patent allowing for timing the contrast bolus. Anatomic variants: None significant.  No aneurysm. Review of the MIP images confirms the above findings IMPRESSION: CT HEAD: 1. Multifocal hypodensities involving both cerebral  hemispheres and cerebellum, relatively stable as compared to prior CT and MRI. Differential considerations as previously described. 2. No other new acute intracranial abnormality. CTA HEAD AND NECK: 1. Normal CTA of the head and neck. No large vessel occlusion, hemodynamically significant stenosis, or other acute vascular abnormality. No evidence for vasculitis. 2. Multiple prominent cervical, axillary, and mediastinal lymph nodes as above. Findings are nonspecific, and could possibly related to patient history of SLE. Reactive adenopathy due to an infectious or other inflammatory process could also be considered. 3. Few scattered pulmonary nodules measuring up to 6 mm within the visualized lungs. Findings are indeterminate, and could also be either infectious or inflammatory in nature. Per Fleischner Society Guidelines, recommend a non-contrast Chest CT at 3-6 months, then consider another non-contrast Chest CT at 18-24 months. If patient is low risk for malignancy, non-contrast Chest CT at 18-24 months is optional. These guidelines do not apply to immunocompromised patients and patients with cancer. Follow up in patients with significant comorbidities as clinically warranted. For lung cancer screening, adhere to Lung-RADS guidelines. Reference: Radiology. 2017; 284(1):228-43. Electronically Signed   By: Rise Mu M.D.   On: 02/25/2023 06:24   MR CERVICAL SPINE W WO CONTRAST Result Date: 02/24/2023 CLINICAL DATA:  Multiple sclerosis EXAM: MRI CERVICAL AND THORACIC SPINE WITHOUT AND WITH CONTRAST TECHNIQUE: Multiplanar and multiecho pulse sequences of the cervical spine, to include the craniocervical junction and cervicothoracic junction, and the thoracic spine, were obtained without and with intravenous contrast. CONTRAST:  10mL GADAVIST GADOBUTROL 1 MMOL/ML IV SOLN COMPARISON:  08/05/2022 MRI thoracic spine, 05/16/2020 MRI cervical spine FINDINGS: MRI CERVICAL SPINE FINDINGS Alignment: No listhesis.  Vertebrae: No acute fracture, evidence of discitis, or suspicious osseous lesion. No abnormal enhancement. Cord: Normal morphology. T2 hyperintense foci in the cervical spinal cord at the left dorsolateral aspect at C4-C5 (series 4, image 23) and at the right lateral aspect at C6-C7 (series 4, image 29). Other previously suspected T2 hyperintense foci are not appreciated on this exam and may have been artifactual. Otherwise normal signal. No abnormal enhancement. Posterior Fossa, vertebral arteries, paraspinal tissues: No acute finding. Disc levels: C2-C3: No significant disc bulge. No spinal canal stenosis or neuroforaminal narrowing. C3-C4: No significant disc bulge. No spinal canal stenosis or neural foraminal narrowing. C4-C5: No significant disc bulge. No spinal canal stenosis or neuroforaminal narrowing. C5-C6: No significant disc bulge. No spinal canal stenosis or neuroforaminal narrowing. C6-C7: No significant disc bulge. No spinal canal stenosis or neuroforaminal narrowing. C7-T1: No significant disc bulge. No spinal canal stenosis or neuroforaminal narrowing. MRI THORACIC SPINE FINDINGS Alignment:  No listhesis. Vertebrae: No acute fracture, evidence of discitis, or suspicious osseous lesion. Redemonstrated hemivertebra at T11. Cord: Evaluation is somewhat limited by respiratory motion. Within this limitation, no abnormal spinal cord signal or enhancement is seen. Normal spinal cord morphology. In the epidural space, there is an epidural collection, which causes moderate thecal sac narrowing and anterior displacement of the thecal sac, which is new from the prior exam. This fluid tends to follow CSF on the provided sequences; for example, see series 22, image 33 and series 26, image 30). In addition, there is mild to moderate epidural fat. No evidence of abscess or phlegmon. Paraspinal and other soft tissues: No acute finding. Disc levels: No osseous spinal canal  stenosis. There is moderate thecal sac  narrowing from approximately T3-T4 through T11-T12, favored to be secondary to an epidural fluid collection, which may be the sequela of the recent lumbar puncture. No high-grade neural foraminal narrowing. IMPRESSION: 1. T2 hyperintense foci in the cervical spinal cord at C4-C5 and C6-C7, consistent with the patient's history of multiple sclerosis. No abnormal enhancement to suggest active demyelination. 2. No abnormal spinal cord signal or enhancement in the thoracic spine. 3. Epidural fluid collection in the thoracic spine, which causes moderate thecal sac narrowing and anterior displacement of the thecal sac, new from the prior exam. This may be the sequela of the recent lumbar puncture. Correlate with physical exam. Electronically Signed   By: Wiliam Ke M.D.   On: 02/24/2023 19:48   MR THORACIC SPINE W WO CONTRAST Result Date: 02/24/2023 CLINICAL DATA:  Multiple sclerosis EXAM: MRI CERVICAL AND THORACIC SPINE WITHOUT AND WITH CONTRAST TECHNIQUE: Multiplanar and multiecho pulse sequences of the cervical spine, to include the craniocervical junction and cervicothoracic junction, and the thoracic spine, were obtained without and with intravenous contrast. CONTRAST:  10mL GADAVIST GADOBUTROL 1 MMOL/ML IV SOLN COMPARISON:  08/05/2022 MRI thoracic spine, 05/16/2020 MRI cervical spine FINDINGS: MRI CERVICAL SPINE FINDINGS Alignment: No listhesis. Vertebrae: No acute fracture, evidence of discitis, or suspicious osseous lesion. No abnormal enhancement. Cord: Normal morphology. T2 hyperintense foci in the cervical spinal cord at the left dorsolateral aspect at C4-C5 (series 4, image 23) and at the right lateral aspect at C6-C7 (series 4, image 29). Other previously suspected T2 hyperintense foci are not appreciated on this exam and may have been artifactual. Otherwise normal signal. No abnormal enhancement. Posterior Fossa, vertebral arteries, paraspinal tissues: No acute finding. Disc levels: C2-C3: No  significant disc bulge. No spinal canal stenosis or neuroforaminal narrowing. C3-C4: No significant disc bulge. No spinal canal stenosis or neural foraminal narrowing. C4-C5: No significant disc bulge. No spinal canal stenosis or neuroforaminal narrowing. C5-C6: No significant disc bulge. No spinal canal stenosis or neuroforaminal narrowing. C6-C7: No significant disc bulge. No spinal canal stenosis or neuroforaminal narrowing. C7-T1: No significant disc bulge. No spinal canal stenosis or neuroforaminal narrowing. MRI THORACIC SPINE FINDINGS Alignment:  No listhesis. Vertebrae: No acute fracture, evidence of discitis, or suspicious osseous lesion. Redemonstrated hemivertebra at T11. Cord: Evaluation is somewhat limited by respiratory motion. Within this limitation, no abnormal spinal cord signal or enhancement is seen. Normal spinal cord morphology. In the epidural space, there is an epidural collection, which causes moderate thecal sac narrowing and anterior displacement of the thecal sac, which is new from the prior exam. This fluid tends to follow CSF on the provided sequences; for example, see series 22, image 33 and series 26, image 30). In addition, there is mild to moderate epidural fat. No evidence of abscess or phlegmon. Paraspinal and other soft tissues: No acute finding. Disc levels: No osseous spinal canal stenosis. There is moderate thecal sac narrowing from approximately T3-T4 through T11-T12, favored to be secondary to an epidural fluid collection, which may be the sequela of the recent lumbar puncture. No high-grade neural foraminal narrowing. IMPRESSION: 1. T2 hyperintense foci in the cervical spinal cord at C4-C5 and C6-C7, consistent with the patient's history of multiple sclerosis. No abnormal enhancement to suggest active demyelination. 2. No abnormal spinal cord signal or enhancement in the thoracic spine. 3. Epidural fluid collection in the thoracic spine, which causes moderate thecal sac  narrowing and anterior displacement of the thecal sac, new from the prior  exam. This may be the sequela of the recent lumbar puncture. Correlate with physical exam. Electronically Signed   By: Wiliam Ke M.D.   On: 02/24/2023 19:48   EEG adult Result Date: 02/24/2023 Charlsie Quest, MD     02/24/2023  4:55 PM Patient Name: SHANITA KANAN MRN: [id number] Epilepsy Attending: Charlsie Quest Referring Physician/Provider: Gordy Councilman, MD Date: 02/24/2023 Duration: 22.36 mins Patient history: 38yo F with MS, intermittent visual disturbance on left. EEG to evaluate for seizure Level of alertness: Awake AEDs during EEG study: None Technical aspects: This EEG study was done with scalp electrodes positioned according to the 10-20 International system of electrode placement. Electrical activity was reviewed with band pass filter of 1-70Hz , sensitivity of 7 uV/mm, display speed of 45mm/sec with a 60Hz  notched filter applied as appropriate. EEG data were recorded continuously and digitally stored.  Video monitoring was available and reviewed as appropriate. Description: EEG showed continuous generalized and maximal right parieto-occipital sharply contoured 3 to 6 Hz theta-delta slowing. Hyperventilation and photic stimulation were not performed.   ABNORMALITY - Continuous slow, generalized and maximal right parieto-occipital IMPRESSION: This study is suggestive of cortical dysfunction arising from right parieto-occipital likely secondary to underlying structural abnormality. Additionally there is moderate diffuse encephalopathy. No seizures or definite epileptiform discharges were seen throughout the recording. Priyanka Annabelle Harman   DG FL GUIDED LUMBAR PUNCTURE Result Date: 02/24/2023 CLINICAL DATA:  Patient with history of lupus, multiple sclerosis who presented to the ED with complaints of headache, blurred vision and weakness for 2 weeks. MRI significant for multiple subacute lesions concerning CNS vasculitis,  cerebritis, tumefactive MS or infection. Request for small volume LP. EXAM: LUMBAR PUNCTURE UNDER FLUOROSCOPY PROCEDURE: An appropriate skin entry site was determined fluoroscopically. Operator donned sterile gloves and mask. Skin site was marked, then prepped with Betadine, draped in usual sterile fashion, and infiltrated locally with 1% lidocaine. A 20 gauge spinal needle advanced into the thecal sac at L4-L5. Clear colorless CSF spontaneously returned, with opening pressure of 25 cm water. 6.5 ml CSF were collected and divided among 4 sterile vials for the requested laboratory studies. The needle was then removed. The patient tolerated the procedure well and there were no complications. FLUOROSCOPY: Radiation Exposure Index (as provided by the fluoroscopic device): 3.1 mGy Kerma IMPRESSION: Technically successful lumbar puncture under fluoroscopy. This exam was performed by Lynnette Caffey, PA-C, and was supervised and interpreted by Marliss Coots, MD. Electronically Signed   By: Marliss Coots M.D.   On: 02/24/2023 12:11   MR Brain W and Wo Contrast Result Date: 02/23/2023 CLINICAL DATA:  Demyelinating disease EXAM: MRI HEAD WITHOUT AND WITH CONTRAST TECHNIQUE: Multiplanar, multiecho pulse sequences of the brain and surrounding structures were obtained without and with intravenous contrast. CONTRAST:  10mL GADAVIST GADOBUTROL 1 MMOL/ML IV SOLN COMPARISON:  Same-day head, brain MR 08/05/2022 FINDINGS: Brain: There are multifocal regions of predominantly cortical diffusion restriction, involving the bilateral insular regions, right occipital lobe, in the left cerebellum. There is contrast enhancement associated with these regions of diffusion restriction, which is predominantly cortical. There is disproportionate degree of T2/FLAIR hyperintense signal abnormality surrounding the sites of diffusion restriction or contrast enhancement. Vascular: Normal flow voids. Skull and upper cervical spine: Normal marrow  signal. Sinuses/Orbits: No middle ear or mastoid effusion. Paranasal sinuses are notable for mucosal thickening in the bilateral maxillary sinuses. Orbits are unremarkable. Other: None. IMPRESSION: Multifocal regions of predominantly cortical diffusion restriction with associated contrast enhancement, involving the bilateral insular  regions, right occipital lobe, and left cerebellum. Findings are nonspecific, but with predominantly cortical involvement, CNS vasculitis is a differential consideration. Given patient's history, an additional differential consideration is demyelinating disease. Recommend further evaluation with a CTA head and neck angiogram. Electronically Signed   By: Lorenza Cambridge M.D.   On: 02/23/2023 15:46   CT Head Wo Contrast Result Date: 02/23/2023 CLINICAL DATA:  Headache. Blurred vision, nausea, and vomiting. History of lupus and multiple sclerosis. EXAM: CT HEAD WITHOUT CONTRAST TECHNIQUE: Contiguous axial images were obtained from the base of the skull through the vertex without intravenous contrast. RADIATION DOSE REDUCTION: This exam was performed according to the departmental dose-optimization program which includes automated exposure control, adjustment of the mA and/or kV according to patient size and/or use of iterative reconstruction technique. COMPARISON:  Head MRI 08/05/2022 FINDINGS: Brain: There is a 5 cm region of confluent heterogeneous hypodensity/edema in the posteromedial right parietal lobe. There is localized sulcal effacement and partial effacement of the right lateral ventricle. Smaller areas of abnormal hypodensity are present in the right frontal operculum and superior left temporal lobe posterior to the sylvian fissure. No acute intracranial hemorrhage, midline shift, hydrocephalus, or extra-axial fluid collection is evident. Vascular: No hyperdense vessel. Skull: No acute fracture or suspicious osseous lesion. Sinuses/Orbits: Mucous retention cyst in the left  sphenoid sinus. Clear mastoid air cells. Unremarkable orbits. Other: None. IMPRESSION: Multifocal cerebral hypodensities, including a 5 cm edematous area in the right parietal lobe. These are indeterminate with considerations including subacute ischemia, neoplasm, an infectious/inflammatory process, and tumefactive demyelination. A brain MRI without and with contrast is recommended for further evaluation. Electronically Signed   By: Sebastian Ache M.D.   On: 02/23/2023 11:32    Labs:  CBC: Recent Labs    02/23/23 1100 02/24/23 0521 02/26/23 0647 02/27/23 0622  WBC 5.1 3.7* 4.9 4.6  HGB 10.0* 9.6* 9.7* 9.5*  HCT 32.1* 30.2* 30.1* 29.6*  PLT 287 260 289 263    COAGS: Recent Labs    02/27/23 0622  INR 1.0    BMP: Recent Labs    02/24/23 0521 02/25/23 0549 02/26/23 0647 02/27/23 0622  NA 139 141 138 142  K 3.3* 3.8 3.6 3.7  CL 110 114* 111 113*  CO2 21* 20* 20* 21*  GLUCOSE 92 101* 113* 89  BUN 9 8 9 9   CALCIUM 8.0* 8.2* 7.9* 8.0*  CREATININE 0.89 0.82 0.89 1.03*  GFRNONAA >60 >60 >60 >60    LIVER FUNCTION TESTS: Recent Labs    11/09/22 0725 12/11/22 0909 02/24/23 0521 02/24/23 1130 02/27/23 0622  BILITOT 0.3 0.3 0.6  --  0.6  AST 56* 10 16  --  13*  ALT 44 7 5  --  7  ALKPHOS 45 69 39  --  41  PROT 5.9* 6.0 6.1*  --  5.8*  ALBUMIN 2.3* 3.5* 2.5* 3.4* 2.4*    TUMOR MARKERS: No results for input(s): "AFPTM", "CEA", "CA199", "CHROMGRNA" in the last 8760 hours.  Assessment and Plan:  38 y.o. female inpatient. History of multiple sclerosis, lupus, GERD, cervical intraepithelial neoplasia, cholelithiasis who presented to [facility] ED on 1/28 with 2-week history of progressive generalized weakness, malaise, headache. Found to have brain lesions, pulmonary nodules and lymphadenopathy. Team is requesting lymph node biopsy for further evaluation.   PLAN: IR Image Guided Lymph Node Biopsy  Risks and benefits of lymph node bi0opsy was discussed with the  patient and/or patient's family including, but not limited to bleeding, infection,  damage to adjacent structures or low yield requiring additional tests.  All of the questions were answered and there is agreement to proceed.  Consent signed and in chart.  CT Angio head and neck dated 1.22.25 reads Other neck: Multiple prominent and hyperdense but not technically enlarged lymph nodes seen within the neck. No other acute finding. Upper chest: Prominent and hyperdense bilateral axillary lymph nodes, measuring up to 1.5 cm bilaterally. Prominent pre-vascular nodes measure up to 9 mm, partially visualized. Few scatter pulmonary nodule seen within the visualized lungs, largest of which measures 6 mm within the left upper lobe (series 10, image 153).   Thank you for this interesting consult.  I greatly enjoyed meeting PEACHES VANOVERBEKE and look forward to participating in their care.  A copy of this report was sent to the requesting provider on this date.  Electronically Signed: Jama Flavors, PA-C 02/27/2023, 9:30 AM     I spent a total of 40 Minutes  in face to face in clinical consultation, greater than 50% of which was counseling/coordinating care for lymph node biopsy.

## 2023-02-27 NOTE — Progress Notes (Signed)
PT Cancellation Note  Patient Details Name: Emily Hicks MRN: [id number] DOB: 12-03-1985   Cancelled Treatment:    Reason Eval/Treat Not Completed: (P) Patient not medically ready, per RN pt on bedrest post biopsy. Will check back as schedule allows to continue with PT POC.  Lenora Boys. PTA Acute Rehabilitation Services Office: [phone]    Catalina Antigua 02/27/2023, 1:38 PM

## 2023-02-27 NOTE — Procedures (Signed)
Pre Procedure Dx: Axillary lymphadenopathy of uncertain etiology Post Procedural Dx: Same  Technically successful US guided biopsy of dominant indeterminate right axillary lymph node.   EBL: None  No immediate complications.   Katherina Right, MD Pager #: [phone]

## 2023-02-27 NOTE — Progress Notes (Signed)
Brief ID note  Emily Hicks was not available as she was off the floor to a procedure. No new findings from extensive workup to date.  Repeat MRI does show some trace right to left shift.  Plan noted for lymph node biopsy and repeat LP today and appreciate IR partnering in this.  CMV antibodies noted and similar to toxoplasma antibodies with high IgG antibody and also an elevated IgM.  Also with a previous history of positive IgM 4 years ago.  Seems to be most consistent with a lab effect and reactive rather than acute CMV.  Similar with toxoplasmosis.  Positive IgG and some IgM and difficult to distinguish if a true positive.  Also a toxoplasma PCR has been sent in the CSF after calling micro and finding enough of a sample to send off from the previous LP. At this time, I do not feel compelled to treat for toxoplasma pending PCR or other identification though will continue to keep this on the differential. I agree with presentation and current findings to use steroids.

## 2023-02-27 NOTE — Progress Notes (Signed)
NEUROLOGY CONSULT FOLLOW UP NOTE   Date of service: February 27, 2023 Patient Name: Emily Hicks MRN:  [id number] DOB:  Jun 24, 1985   Brief HPI   38 year old woman who was diagnosed with MS in early 2020 but has had symptoms since 2010. She also has SLE.  Currently on azathioprine 150 mg daily and HCQ 200 mg po bid.    Not currently on a biologic but was on Saphnelo , an IFN-1 receptor antagonist IV  Also per notes previously tried [place] and took only one week before having a hospitalization for fever, hypernatremia, joint inflammation, thrush) and it was stopped (LFT elevated on this medication) Last admission 11/2022, diagnosed with myositis attributed to her SLE and started on prednisone at that time. Patient did report to she is no longer on prednisone she thinks she stopped sometime in Dec   Prior testing for DMARD safety labs includes negative TB and JCV positive > 1.5   Interval Hx/subjective  - Worsening headache this morning per Emily Hicks (positional; worse on sitting up / standing) - Otherwise stable - On later evaluation at ~8 PM after steroids feeling a bit better with improving appetite  Vitals   Vitals:   02/26/23 1054 02/26/23 1610 02/26/23 2355 02/27/23 0403  BP: (!) 129/98 (!) 137/95 (!) 137/91 (!) 150/100  Pulse: 97 99 100 (!) 107  Resp: 17 17 18 18   Temp: 97.9 F (36.6 C) 98.3 F (36.8 C) 98.9 F (37.2 C) 99.4 F (37.4 C)  TempSrc: Oral Oral Oral Oral  SpO2: 100% 100% 100% 99%  Weight:      Height:         Body mass index is 36.52 kg/m.  Physical Exam   Constitutional: Appears less fatigued than 1/22  Psych: Affect calm, cooperative  Eyes: No scleral injection.  HENT: No OP obstrucion.  Head: Normocephalic.  Cardiovascular: Normal rate and regular rhythm.  Respiratory: Effort normal, non-labored breathing GI: Soft.  No distension. There is no tenderness.  Skin: WDI.   Neurologic Examination   Neuro:  Mental Status: Patient is awake, alert,  oriented to person, place, month, year, and situation. Patient is able to give a clear and coherent history, including details of medications, good understanding of prior discussions  No signs of aphasia in casual speech or neglect Cranial Nerves: II: Visual Fields with left stable hemianopia. Pupils are equal, round, and reactive to light.   III,IV, VI: EOMI without ptosis or diploplia.  V: Facial sensation is symmetric to light touch VII: Facial movement is symmetric, intermittnet slight left droop VIII: hearing is intact to voice X: Uvula elevates symmetrically XI: Shoulder shrug is symmetric. XII: tongue is midline without atrophy or fasciculations.  Motor: No pronator drift, stable 4 to 5/5 BUE strength; patient deferred LE testing due to joint pain Sensory: Reports reduced sensation on the left face/arm/leg -- stable  Reflexes:  On 1/22: 3+ symmetric bilateral patellae and brachioradialis. 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   Medications  Current Facility-Administered Medications:    acetaminophen (TYLENOL) tablet 650 mg, 650 mg, Oral, Q6H PRN, 650 mg at 02/26/23 1303 **OR** acetaminophen (TYLENOL) suppository 650 mg, 650 mg, Rectal, Q6H PRN, Pokhrel, Laxman, MD   azaTHIOprine (IMURAN) tablet 150 mg, 150 mg, Oral, Daily, [place], Eric J, DO, 150 mg at 02/26/23 1003   cholecalciferol (VITAMIN D3) 25 MCG (1000 UNIT) tablet 1,000 Units, 1,000 Units, Oral, Daily, [place], Alvira Philips, DO, 1,000 Units at 02/25/23 1004   clobetasol ointment (TEMOVATE) 0.05 % 1  Application, 1 Application, Topical, Daily PRN, Pokhrel, Laxman, MD   enoxaparin (LOVENOX) injection 40 mg, 40 mg, Subcutaneous, Q24H, Pokhrel, Laxman, MD, 40 mg at 02/25/23 2112   gabapentin (NEURONTIN) capsule 300 mg, 300 mg, Oral, TID PRN, Pokhrel, Laxman, MD   hydrALAZINE (APRESOLINE) injection 10 mg, 10 mg, Intravenous, Q6H PRN, Pokhrel, Laxman, MD, 10 mg at 02/26/23 0435   HYDROcodone-acetaminophen (NORCO/VICODIN) 5-325 MG per tablet 1-2  tablet, 1-2 tablet, Oral, Q4H PRN, Pokhrel, Laxman, MD, 1 tablet at 02/25/23 2037   hydroxychloroquine (PLAQUENIL) tablet 200 mg, 200 mg, Oral, BID, Pokhrel, Laxman, MD, 200 mg at 02/26/23 2347   ketoconazole (NIZORAL) 2 % shampoo 1 Application, 1 Application, Topical, Weekly, Pokhrel, Laxman, MD   metoprolol succinate (TOPROL-XL) 24 hr tablet 25 mg, 25 mg, Oral, QHS, Pokhrel, Laxman, MD, 25 mg at 02/26/23 2347   mirtazapine (REMERON SOL-TAB) disintegrating tablet 7.5 mg, 7.5 mg, Oral, QHS, Adal Sereno L, MD, 7.5 mg at 02/26/23 2348   ondansetron (ZOFRAN) tablet 4 mg, 4 mg, Oral, Q6H PRN **OR** ondansetron (ZOFRAN) injection 4 mg, 4 mg, Intravenous, Q6H PRN, Pokhrel, Laxman, MD, 4 mg at 02/26/23 1330   pantoprazole (PROTONIX) EC tablet 40 mg, 40 mg, Oral, Daily, Pokhrel, Laxman, MD, 40 mg at 02/26/23 1003   polyethylene glycol (MIRALAX / GLYCOLAX) packet 17 g, 17 g, Oral, Daily PRN, Pokhrel, Laxman, MD   sodium chloride flush (NS) 0.9 % injection 3 mL, 3 mL, Intravenous, Q12H, Pokhrel, Laxman, MD, 3 mL at 02/26/23 2349   sodium chloride flush (NS) 0.9 % injection 3 mL, 3 mL, Intravenous, PRN, Pokhrel, Laxman, MD   zolpidem (AMBIEN) tablet 5 mg, 5 mg, Oral, QHS PRN, Pokhrel, Laxman, MD, 5 mg at 02/25/23 2113  Labs and Diagnostic Imaging   Autoimmune/infectious workup - LP, low volume due to right parietal mass lesion (which is not obstructing) RBC 31/4, WBC 3/4, Protein 76, glucose 38 (~50% of serum 76), meningitis/encephalitis panel negative,  gram stain neg (no organisms, mononuclear WBC) Pending: IgG index, OCB, cytology, fungal and bacterial cultures. CSF TB PCR - Serum labs:  - Quantiferon gold testing pending (negative 4 years ago) - Toxoplasma IgG 81.2, IgM 22.2, Toxo PCR not able to send yet due to insufficient CSF - HIV (-), RPR (-) - CD4 390 (400 - 1790), CD4% 44.64 (Total lymphocyte count 874 (1000 - 4000), Cd8tox 44.16 (12-40%), CD8 T Cell Abs 386, Ratio 1.01  - JC Virus Titer  pending (whole blood qualitative also pending but expected to be positive given Feb 2020 Titer 3.28) - Anti-MOG antibody negative ([facility]) - CMV IgM 165 (down from >240 4 years ago); IgG > 10 - Pending EBV, bartonella - LDH 192 (normal)  - CK 8947 (11/06/22) --> 21 (12/11/22) --> 28 (02/26/23) - Aldolase < 2 - ESR 90 (11/04/22) --> 42 (11/08/22) --> 25 (02/26/23) - CRP 5.2 (11/05/22) --> 0.9 (11/08/22) --> 0.6 (02/26/23) - C3, C4 pending - Heavy metals blood pending - Volatiles (acetone,ethanol,isoprop,methanol) negative (ordered by primary team) - Blood cultures 02/26/2023 pending      Basic Metabolic Panel: Recent Labs  Lab 02/23/23 1100 02/24/23 0521 02/25/23 0549 02/26/23 0647 02/27/23 0622  NA 142 139 141 138 142  K 3.8 3.3* 3.8 3.6 3.7  CL 112* 110 114* 111 113*  CO2 22 21* 20* 20* 21*  GLUCOSE 90 92 101* 113* 89  BUN 12 9 8 9 9   CREATININE 0.92 0.89 0.82 0.89 1.03*  CALCIUM 8.4* 8.0* 8.2* 7.9* 8.0*  MG  --  1.5* 1.9 1.8  --   PHOS  --  2.7  --   --   --     CBC: Recent Labs  Lab 02/23/23 1100 02/24/23 0521 02/26/23 0647 02/27/23 0622  WBC 5.1 3.7* 4.9 4.6  NEUTROABS 4.3  --   --   --   HGB 10.0* 9.6* 9.7* 9.5*  HCT 32.1* 30.2* 30.1* 29.6*  MCV 86.5 85.8 85.5 85.8  PLT 287 260 289 263    CT Head without contrast(Personally reviewed): Multifocal cerebral hypodensities, including a 5 cm edematous area in the right parietal lobe. These are indeterminate with considerations including subacute ischemia, neoplasm, an infectious/inflammatory process, and tumefactive demyelination. A brain MRI without and with contrast is recommended for further evaluation.  CT angio Head and Neck with contrast(Personally reviewed): 1. Normal CTA of the head and neck. No large vessel occlusion, hemodynamically significant stenosis, or other acute vascular abnormality. No evidence for vasculitis. 2. Multiple prominent cervical, axillary, and mediastinal lymph nodes as above.  Findings are nonspecific, and could possibly related to patient history of SLE. Reactive adenopathy due to an infectious or other inflammatory process could also be considered. 3. Few scattered pulmonary nodules measuring up to 6 mm within the visualized lungs. Findings are indeterminate, and could also be either infectious or inflammatory in nature. Per Fleischner Society Guidelines, recommend a non-contrast Chest CT at 3-6 months, then consider another non-contrast Chest CT at 18-24 months. If patient is low risk for malignancy, non-contrast Chest CT at 18-24 months is optional. These guidelines do not apply to immunocompromised patients and patients with cancer. Follow up in patients with significant comorbidities as clinically warranted. For lung cancer screening, adhere to Lung-RADS guidelines. Reference: Radiology. 2017; 284(1):228-43.  MRI brain 02/26/2023 (Personally reviewed) 1. Multifocal areas of T2/FLAIR signal abnormality involving the cortical and subcortical aspects of the bilateral cerebral and cerebellar hemispheres, stable in number and distribution from prior, but overall slightly more prominent with increased size and progressive T2/FLAIR signal abnormality. Again, differential considerations are broad, and described on previous exams. 2. Increased mass effect by the dominant right occipital lesion on the posterior aspect of the right lateral ventricle, with new trace right-to-left shift at the septum pellucidum.  MRI Brain 02/23/2023 (Personally reviewed): Multifocal regions of predominantly cortical diffusion restriction with associated contrast enhancement, involving the bilateral insular regions, right occipital lobe, and left cerebellum. Findings are nonspecific, but with predominantly cortical involvement, CNS vasculitis is a differential consideration. Given patient's history, an additional differential consideration is demyelinating disease. Recommend  further evaluation with a CTA head and neck angiogram. Second personal review of MRI by Dr. Otelia Limes (Neurology): MRI reveals a medium sized subacute-appearing cortically based lesion with subjacent white matter involvement in the right occipital lobe that has patchy internal enhancement. Also noted is a lesion that is small and cortically based in the left anterior temporal lobe. Smaller cortically based right frontal lobe lesions also noted. On personal review of the images, there also appear to be bilateral subtle FLAIR-hyperintense cerebellar lesions. All of the above show some degree of diffusion restriction. Only the right occipital lobe lesion appears to enhance.   MRI brain 08/05/2022 (personally reviewed) Small number of T2/FLAIR hypertense foci in the cerebral hemispheres and 1 focus in the right upper pons in a pattern consistent with chronic demyelinating plaque associated with multiple sclerosis.  None of the foci appear to be acute.  Compared to previous MRIs, there were no new lesions. Normal enhancement pattern.  No acute findings.  MRI C and T-spine w/ and w/o 02/24/2023 1. T2 hyperintense foci in the cervical spinal cord at C4-C5 and C6-C7, consistent with the patient's history of multiple sclerosis. No abnormal enhancement to suggest active demyelination. 2. No abnormal spinal cord signal or enhancement in the thoracic spine. 3. Epidural fluid collection in the thoracic spine, which causes moderate thecal sac narrowing and anterior displacement of the thecal sac, new from the prior exam. This may be the sequela of the recent lumbar puncture. Correlate with physical exam.  CT chest/abdomen/pelvis 1. Small bilateral pleural effusions with associated bibasilar atelectasis. 2. Scattered tiny pulmonary nodules measuring up to 5 mm, nonspecific. While pulmonary metastatic disease is not strictly excluded, these are more commonly the result of remote infection or inflammation in a  patient of this age. 3. Shotty bilateral axillary and external iliac lymphadenopathy, nonspecific. No frankly pathologic adenopathy seen within the chest, abdomen, or pelvis. 4. Moderate hepatic steatosis. 5. Small hiatal hernia.  11/05/2022 MRI right and left femur 1. Severe, nearly symmetric T2 hyperintensity throughout the musculature of the lower pelvis and proximal thighs bilaterally consistent with myositis. There is asymmetric involvement of the left semitendinosus and biceps muscles. 2. No focal muscular atrophy or fluid collection identified. 3. No acute osseous findings. 4. Bilateral external and inguinal lymphadenopathy, further discussed on pelvic MRI.   rEEG 1/21:  - Continuous slow, generalized and maximal right parieto-occipital This study is suggestive of cortical dysfunction arising from right parieto-occipital likely secondary to underlying structural abnormality. Additionally there is moderate diffuse encephalopathy. No seizures or definite epileptiform discharges were seen throughout the recording.  Assessment   38 y.o. female with a past medical history of Cervical intraepithelial neoplasia III, Gallstones, GERD (gastroesophageal reflux disease), History of blood transfusion (2020), Lupus (dx 2020), Multiple sclerosis (dx 2020), and Numbness and tingling of left leg who presents with a 2 week history of diffuse weakness and malaise in conjunction with headache, runny nose, nausea, congestion, and nose bleeds x1 week. Immunosuppressed on azathioprine and plaquenil Exam is overall stable and notable for left homonymous hemianopsia and mild weakness proximal > distal.  The left homonymous hemianopsia corresponds to the subacute medium-sized partially enhancing lesion seen on MRI.  Brain imaging is notable for marked new lesions since July 2024, with imaging findings much more impressive than the patient's examination, multifocal as detailed above.  Differential is broad  for these lesions given her immunosuppression and autoimmune history  Key differentials:  She has been positive for JC virus in the past, certainly the right parietal lesion is atypical for PML but some of the other lesions are more consistent with a classic PML appearance Also with azathioprine need to consider the possibility of an underlying lymphoma; she has new lymphadenopathy and pulmonary nodules on CT imaging, nonspecific; CT Chest/Abdomen/Pelvis does not show any clear pathological lymph nodes. Will still attempt biopsy in attempt to clarify underlying diagnosis, appreciate IR guidance for this In review with Dr. Epimenio Foot, neuro immunologist, tumefactive MS is felt to be lower in the differential, but will still consider this Anti-MOG negative, making MOG encephalitis less likely Less likely vasculitis given reassuring CTA and minimally elevated ESR (actually improved from Oct 2024 when she had myositis) and minimally elevated CRP   Very unlikely fungal infection given CSF does not seem inflammatory Not having any active myositis contributing to her clinical picture given reassuring muscle enzymes despite continued thigh pain  Recommendations  - Start solumedrol 1 gram daily planned for 5 day course today --  noon today 1/24, then 8 AM on 1/25, then 6 AM 1/26,27,28 to avoid delays but achieve physiological timing - Glucose checks and LDSSI and pantoprazole while on steriods - Appreciate ID input, hold off on concurrent empiric toxoplasmosis treatment per ID - Will consider low volume repeat tap in the future: 2-3 cc to test for JCV PCR (0.2-0.5 cc required), Toxoplasma PCR (0.2-0.5 cc required), EBV PCR, CMV PCR -- cancelled for today due to worsening positional headache, concern for CSF leak  - Mirtazapine for sleep/anxiety/appetite stimulation 7.5 mg QHS - continue azathioprine and plaquenil - Appreciate IR for lymph node biopsy  - Neurology will follow along - Discussed with patient and  Emily Hicks at bedside and primary team / ID via secure chat as well as fluoro team by secure chat ______________________________________________________________________  Brooke Dare MD-PhD [facility] [phone] Available 7 AM to 7 PM, outside these hours please contact Neurologist on call listed on AMION

## 2023-02-27 NOTE — Plan of Care (Signed)
  Problem: Clinical Measurements: Goal: Will remain free from infection Outcome: Not Progressing   Problem: Clinical Measurements: Goal: Diagnostic test results will improve Outcome: Not Progressing   Problem: Nutrition: Goal: Adequate nutrition will be maintained Outcome: Not Progressing   Problem: Coping: Goal: Level of anxiety will decrease Outcome: Not Progressing   Problem: Safety: Goal: Ability to remain free from injury will improve Outcome: Not Progressing

## 2023-02-27 NOTE — TOC Progression Note (Signed)
Transition of Care [facility]) - Progression Note    Patient Details  Name: [name] MRN: [id number] Date of Birth: Jun 18, 1985  [facility] CM/SW Contact  Kermit Balo, RN Phone Number: 02/27/2023, 2:09 PM  Clinical Narrative:     Pt is s/p: US guided right axillary LN Bx  TOC following for d/c needs. Will need outpt therapy arranged closer to d/c.     Expected Discharge Plan: OP Rehab Barriers to Discharge: Continued Medical Work up  Expected Discharge Plan and Services   Discharge Planning Services: CM Consult   Living arrangements for the past 2 months: Single Family Home                                       Social Determinants of Health (SDOH) Interventions SDOH Screenings   Food Insecurity: No Food Insecurity (02/23/2023)  Housing: Low Risk  (02/23/2023)  Transportation Needs: No Transportation Needs (02/23/2023)  Utilities: Not At Risk (02/23/2023)  Depression (PHQ2-9): Low Risk  (08/15/2021)  Tobacco Use: Low Risk  (02/23/2023)    Readmission Risk Interventions     No data to display

## 2023-02-28 DIAGNOSIS — R519 Headache, unspecified: Secondary | ICD-10-CM | POA: Diagnosis not present

## 2023-02-28 DIAGNOSIS — G35 Multiple sclerosis: Secondary | ICD-10-CM | POA: Diagnosis not present

## 2023-02-28 DIAGNOSIS — G939 Disorder of brain, unspecified: Secondary | ICD-10-CM | POA: Diagnosis not present

## 2023-02-28 LAB — MISC LABCORP TEST (SEND OUT): Labcorp test code: 138602

## 2023-02-28 LAB — COMPREHENSIVE METABOLIC PANEL
ALT: 6 U/L (ref 0–44)
AST: 11 U/L — ABNORMAL LOW (ref 15–41)
Albumin: 2.5 g/dL — ABNORMAL LOW (ref 3.5–5.0)
Alkaline Phosphatase: 48 U/L (ref 38–126)
Anion gap: 11 (ref 5–15)
BUN: 22 mg/dL — ABNORMAL HIGH (ref 6–20)
CO2: 20 mmol/L — ABNORMAL LOW (ref 22–32)
Calcium: 8.2 mg/dL — ABNORMAL LOW (ref 8.9–10.3)
Chloride: 111 mmol/L (ref 98–111)
Creatinine, Ser: 1.22 mg/dL — ABNORMAL HIGH (ref 0.44–1.00)
GFR, Estimated: 59 mL/min — ABNORMAL LOW (ref >60)
Glucose, Bld: 125 mg/dL — ABNORMAL HIGH (ref 70–99)
Potassium: 4.7 mmol/L (ref 3.5–5.1)
Sodium: 142 mmol/L (ref 135–145)
Total Bilirubin: 0.5 mg/dL (ref 0.0–1.2)
Total Protein: 6.3 g/dL — ABNORMAL LOW (ref 6.5–8.1)

## 2023-02-28 LAB — CBC
HCT: 31 % — ABNORMAL LOW (ref 36.0–46.0)
Hemoglobin: 10 g/dL — ABNORMAL LOW (ref 12.0–15.0)
MCH: 27.7 pg (ref 26.0–34.0)
MCHC: 32.3 g/dL (ref 30.0–36.0)
MCV: 85.9 fL (ref 80.0–100.0)
Platelets: 300 10*3/uL (ref 150–400)
RBC: 3.61 MIL/uL — ABNORMAL LOW (ref 3.87–5.11)
RDW: 13.6 % (ref 11.5–15.5)
WBC: 1.6 10*3/uL — ABNORMAL LOW (ref 4.0–10.5)
nRBC: 0 % (ref 0.0–0.2)

## 2023-02-28 LAB — HEAVY METALS, BLOOD
Arsenic: 2 ug/L (ref 0–9)
Lead: <1 ug/dL (ref 0.0–3.4)
Mercury: <1 ug/L (ref 0.0–14.9)

## 2023-02-28 LAB — GLUCOSE, CAPILLARY
Glucose-Capillary: 126 mg/dL — ABNORMAL HIGH (ref 70–99)
Glucose-Capillary: 139 mg/dL — ABNORMAL HIGH (ref 70–99)
Glucose-Capillary: 132 mg/dL — ABNORMAL HIGH (ref 70–99)
Glucose-Capillary: 134 mg/dL — ABNORMAL HIGH (ref 70–99)

## 2023-02-28 MED ORDER — HYDRALAZINE HCL 20 MG/ML IJ SOLN
10.0000 mg | Freq: Four times a day (QID) | INTRAMUSCULAR | Status: DC | PRN
  Administered 2023-02-28: 10 mg via INTRAVENOUS

## 2023-02-28 MED ORDER — METOPROLOL SUCCINATE ER 50 MG PO TB24
50.0000 mg | ORAL_TABLET | Freq: Every day | ORAL | Status: DC
  Administered 2023-02-28 – 2023-03-02 (×3): 50 mg via ORAL
  Filled 2023-02-28 (×3): qty 1

## 2023-02-28 NOTE — Progress Notes (Signed)
Physical Therapy Treatment Patient Details Name: Emily Hicks MRN: [id number] DOB: 14-Dec-1985 Today's Date: 02/28/2023   History of Present Illness 38 y.o. female who presented to [facility] ED on 1/20 with 2-week history of progressive generalized weakness, malaise, headache. Transferred to [facility] 1/20 for neurological work up. MRI reveals Multifocal regions of predominantly cortical diffusion restriction with associated contrast enhancement, involving the bilateral insular regions, right occipital lobe, and left cerebellum. EEG suggestive cortical dysfunction arising from right parieto-occipital likely secondary to underlying structural abnormality. 1/21 underwent LP. PMH: multiple sclerosis, lupus, GERD, cervical intraepithelial neoplasia, cholelithiasis.    PT Comments  Pt received in supine, alert with slow processing and agreeable to therapy session, sister Emily Hicks present and encouraging. Pt needing increased time to initiate and perform all tasks, with some decreased safety awareness and difficulty sequencing body mechanics at times. Pt BP elevated with standing (RN notified) and pt c/o moderate fatigue (5/10 modified RPE) seated in recliner at end of session, very short distance gait trials x2 in room with RW. Plan to bring rollator next session and work on stairs if pt able to tolerate more standing activity and BP more stable. Pt agreeable to sit up in chair to eat lunch, aware of need to use call bell prior to getting up and chair alarm on for pt safety. Pt continues to benefit from PT services to progress toward functional mobility goals.    Orthostatic Sitting  BP- Sitting (!) 136/101  Pulse- Sitting 95  Orthostatic Standing at 0 minutes  BP- Standing at 0 minutes (!) 142/110  Pulse- Standing at 0 minutes 103   Vital Signs  BP (!) 138/103  BP Location Right Arm  BP Method Automatic  Patient Position (if appropriate)  (recliner)     If plan is discharge home, recommend the  following: A little help with walking and/or transfers;Assistance with cooking/housework;Direct supervision/assist for medications management;Direct supervision/assist for financial management;Supervision due to cognitive status   Can travel by private vehicle        Equipment Recommendations  None recommended by PT    Recommendations for Other Services       Precautions / Restrictions Precautions Precautions: Fall Precaution Comments: left hemianopsia Restrictions Weight Bearing Restrictions Per Provider Order: No     Mobility  Bed Mobility Overal bed mobility: Needs Assistance Bed Mobility: Rolling, Sidelying to Sit Rolling: Contact guard assist Sidelying to sit: Min assist       General bed mobility comments: From flat bed with no rails, pt needing up to minA to raise trunk, slow processing, difficulty sequencing without use of rail.    Transfers Overall transfer level: Needs assistance Equipment used: Rolling walker (2 wheels) Transfers: Sit to/from Stand Sit to Stand: Contact guard assist, From elevated surface           General transfer comment: From 4-5" elevated bed height (per home set-up) no lift assist needed but pt forgetting to push from mattress, pt needing ~3 mins after cues to stand before she was able to stand; slow processing, pt reports mild dizziness vs fatigue, minimally verbal.    Ambulation/Gait Ambulation/Gait assistance: Contact guard assist Gait Distance (Feet): 22 Feet (93ft to bathroom, then 50ft to chair) Assistive device: Rolling walker (2 wheels) Gait Pattern/deviations: Step-through pattern, Decreased stride length, Drifts right/left Gait velocity: grossly <0.4 m/s     General Gait Details: decreased environmental awareness, pt needs cues and light assist to avoid bumping R hand into bed rail in narrow areas of  the room; mod cues for proximity to RW and activity pacing.   Stairs             Wheelchair Mobility     Tilt  Bed    Modified Rankin (Stroke Patients Only)       Balance Overall balance assessment: Needs assistance Sitting-balance support: No upper extremity supported, Feet supported Sitting balance-Leahy Scale: Good     Standing balance support: Single extremity supported, During functional activity, Bilateral upper extremity supported, Reliant on assistive device for balance Standing balance-Leahy Scale: Poor Standing balance comment: relying on RW for stability due to fatigue/for safety, able to stand and wash hands at sink without buckling                            Cognition Arousal: Alert Behavior During Therapy: WFL for tasks assessed/performed, Flat affect Overall Cognitive Status: Impaired/Different from baseline Area of Impairment: Attention, Safety/judgement, Awareness, Problem solving, Following commands                   Current Attention Level: Sustained Memory: Decreased short-term memory Following Commands: Follows one step commands consistently, Follows multi-step commands inconsistently, Follows one step commands with increased time Safety/Judgement: Decreased awareness of safety Awareness: Intellectual Problem Solving: Slow processing, Decreased initiation, Requires verbal cues General Comments: Slow processing, increased time to initiate, pt minimally verbal unless prompted and often needs reminders for safety or repetition of cues (for example cues for where to push from when standing). flat affect noted throughout. Pt stood from toilet (BSC height) without waiting for therapist to stand by when done toileting, but she was instructed to prior. Pt would benefit from higher level assessment.        Exercises Other Exercises Other Exercises: seated BLE AROM: heel/toe raises, LAQ x5-10 reps ea    General Comments General comments (skin integrity, edema, etc.): Pt attempted to void but only very small amount, per her sister she has not urinated since  evening; no BM per pt ;see BP in comments above      Pertinent Vitals/Pain Pain Assessment Pain Assessment: No/denies pain (at times grimacing but no verbal c/o pain)    Home Living                          Prior Function            PT Goals (current goals can now be found in the care plan section) Acute Rehab PT Goals Patient Stated Goal: none stated PT Goal Formulation: With patient Time For Goal Achievement: 03/12/23 Progress towards PT goals: Progressing toward goals    Frequency    Min 3X/week      PT Plan      Co-evaluation              AM-PAC PT "6 Clicks" Mobility   Outcome Measure  Help needed turning from your back to your side while in a flat bed without using bedrails?: None Help needed moving from lying on your back to sitting on the side of a flat bed without using bedrails?: A Little Help needed moving to and from a bed to a chair (including a wheelchair)?: A Little Help needed standing up from a chair using your arms (e.g., wheelchair or bedside chair)?: A Little Help needed to walk in hospital room?: A Little Help needed climbing 3-5 steps with a railing? : Total 6 Click Score:  17    End of Session Equipment Utilized During Treatment: Gait belt Activity Tolerance: Patient limited by fatigue;Patient limited by lethargy;Treatment limited secondary to medical complications (Comment);Other (comment) (DBP somewhat elevated in standing, RN notified; distance limited to room for pt safety) Patient left: in chair;with call bell/phone within reach;with chair alarm set;with family/visitor present;Other (comment) (sister Emily Hicks in room) Nurse Communication: Mobility status;Other (comment) (BP) PT Visit Diagnosis: Unsteadiness on feet (R26.81);Other abnormalities of gait and mobility (R26.89);Muscle weakness (generalized) (M62.81)     Time: 1229-1300 PT Time Calculation (min) (ACUTE ONLY): 31 min  Charges:    $Gait Training: 8-22  mins $Therapeutic Activity: 8-22 mins PT General Charges $$ ACUTE PT VISIT: 1 Visit                     Arthor Gorter P., PTA Acute Rehabilitation Services Secure Chat Preferred 9a-5:30pm Office: [phone]    Dorathy Kinsman [facility] 02/28/2023, 1:29 PM

## 2023-02-28 NOTE — Plan of Care (Signed)

## 2023-02-28 NOTE — Progress Notes (Signed)
NEUROLOGY CONSULT FOLLOW UP NOTE   Date of service: February 28, 2023 Patient Name: Emily Hicks MRN:  [id number] DOB:  1985/11/28   Brief HPI   38 year old woman who was diagnosed with MS in early 2020 but has had symptoms since 2010. She also has SLE.  Currently on azathioprine 150 mg daily and HCQ 200 mg po bid.    Not currently on a biologic but was on Saphnelo , an IFN-1 receptor antagonist   Also per notes previously tried [place] and took only one week before having a hospitalization for fever, hypernatremia, joint inflammation, thrush) and it was stopped (LFT elevated on this medication) Last admission 11/2022, diagnosed with myositis attributed to her SLE and started on prednisone at that time. Patient did report to she is no longer on prednisone she thinks she stopped sometime in Dec   Prior testing for DMARD safety labs includes negative TB and JCV positive > 1.5   Interval Hx/subjective  - Sat up in bed to eat and then began to feel woozy and had a headache. Feeling better laying flat. Ate a little breakfast this morning, now wants to nap.  Vitals   Vitals:   02/27/23 1949 02/27/23 2354 02/28/23 0403 02/28/23 0742  BP: (!) 130/97 (!) 134/96 (!) 140/100 (!) 160/121  Pulse: (!) 106 93 87 84  Resp: 16 16 19 17   Temp: 98.1 F (36.7 C) 98.8 F (37.1 C)  98.4 F (36.9 C)  TempSrc: Oral Oral Oral Oral  SpO2: 96% 100% 99% 100%  Weight:      Height:         Body mass index is 36.52 kg/m.  Physical Exam   Constitutional: No acute distress, fatigued appearing Psych: Affect calm, cooperative  Eyes: No scleral injection.  HENT: No OP obstrucion.  Head: Normocephalic.  Cardiovascular: Normal rate and regular rhythm.  Respiratory: Effort normal, non-labored breathing GI: Soft.  No distension. There is no tenderness.  Skin: WDI.   Neurologic Examination   Neuro:  Mental Status: Patient is appears fatigued but awake, alert, oriented to person, place, month, year, and  situation. Patient is able to give a clear and coherent history, including details of medications, good understanding of prior discussions  Very mild motor neglect at times for example when I asked her to raise both her arms she only raises her right arm initially Cranial Nerves: II: Visual Fields with left stable hemianopia. Pupils are equal, round, and reactive to light.   III,IV, VI: EOMI without ptosis or diploplia.  V: Facial sensation is symmetric to light touch VII: Facial movement is symmetric, intermittent slight left droop, also intermittently slight right facial droop at times VIII: hearing is intact to voice X: Uvula incompletely visualized XI: Shoulder shrug is symmetric. XII: tongue is midline without atrophy or fasciculations.  Motor: No pronator drift, stable 4 to 5/5 BUE strength;  Slightly weaker in the left lower extremity today than the right (4/5 on the right, 4 -/5 on the left) Sensory: Reports reduced sensation on the left face/arm/leg -- stable  Reflexes:  On 1/22: 3+ symmetric bilateral patellae and brachioradialis. 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   Medications  Current Facility-Administered Medications:    acetaminophen (TYLENOL) tablet 650 mg, 650 mg, Oral, Q6H PRN, 650 mg at 02/26/23 1303 **OR** acetaminophen (TYLENOL) suppository 650 mg, 650 mg, Rectal, Q6H PRN, Pokhrel, Laxman, MD   azaTHIOprine (IMURAN) tablet 150 mg, 150 mg, Oral, Daily, [place], Alvira Philips, DO, 150 mg at 02/27/23 [phone]  cholecalciferol (VITAMIN D3) 25 MCG (1000 UNIT) tablet 1,000 Units, 1,000 Units, Oral, Daily, [place], Alvira Philips, DO, 1,000 Units at 02/27/23 1610   clobetasol ointment (TEMOVATE) 0.05 % 1 Application, 1 Application, Topical, Daily PRN, Pokhrel, Laxman, MD   enoxaparin (LOVENOX) injection 40 mg, 40 mg, Subcutaneous, Q24H, Reome, Earle J, RPH   gabapentin (NEURONTIN) capsule 300 mg, 300 mg, Oral, TID PRN, Pokhrel, Laxman, MD   hydrALAZINE (APRESOLINE) injection 10 mg, 10 mg, Intravenous,  Q6H PRN, Jerald Kief, MD, 10 mg at 02/28/23 0804   HYDROcodone-acetaminophen (NORCO/VICODIN) 5-325 MG per tablet 1-2 tablet, 1-2 tablet, Oral, Q4H PRN, Pokhrel, Laxman, MD, 2 tablet at 02/27/23 0913   hydroxychloroquine (PLAQUENIL) tablet 200 mg, 200 mg, Oral, BID, Pokhrel, Laxman, MD, 200 mg at 02/27/23 2146   insulin aspart (novoLOG) injection 0-9 Units, 0-9 Units, Subcutaneous, TID WC, Kassidi Elza L, MD, 1 Units at 02/28/23 0649   ketoconazole (NIZORAL) 2 % shampoo 1 Application, 1 Application, Topical, Weekly, Pokhrel, Laxman, MD   [COMPLETED] methylPREDNISolone sodium succinate (SOLU-MEDROL) 1,000 mg in sodium chloride 0.9 % 50 mL IVPB, 1,000 mg, Intravenous, Q24H, Last Rate: 66 mL/hr at 02/27/23 1247, 1,000 mg at 02/27/23 1247 **FOLLOWED BY** methylPREDNISolone sodium succinate (SOLU-MEDROL) 1,000 mg in sodium chloride 0.9 % 50 mL IVPB, 1,000 mg, Intravenous, Q24H, Last Rate: 66 mL/hr at 02/28/23 0806, 1,000 mg at 02/28/23 0806 **FOLLOWED BY** [START ON 03/01/2023] methylPREDNISolone sodium succinate (SOLU-MEDROL) 1,000 mg in sodium chloride 0.9 % 50 mL IVPB, 1,000 mg, Intravenous, Q24H **FOLLOWED BY** [START ON 03/02/2023] methylPREDNISolone sodium succinate (SOLU-MEDROL) 1,000 mg in sodium chloride 0.9 % 50 mL IVPB, 1,000 mg, Intravenous, Q24H, Wade Sigala L, MD   metoprolol succinate (TOPROL-XL) 24 hr tablet 50 mg, 50 mg, Oral, QHS, Jerald Kief, MD   mirtazapine (REMERON SOL-TAB) disintegrating tablet 7.5 mg, 7.5 mg, Oral, QHS, Makyla Bye L, MD, 7.5 mg at 02/27/23 2144   ondansetron (ZOFRAN) tablet 4 mg, 4 mg, Oral, Q6H PRN **OR** ondansetron (ZOFRAN) injection 4 mg, 4 mg, Intravenous, Q6H PRN, Pokhrel, Laxman, MD, 4 mg at 02/26/23 1330   pantoprazole (PROTONIX) EC tablet 40 mg, 40 mg, Oral, Daily, Pokhrel, Laxman, MD, 40 mg at 02/27/23 0917   polyethylene glycol (MIRALAX / GLYCOLAX) packet 17 g, 17 g, Oral, Daily PRN, Pokhrel, Laxman, MD   sodium chloride flush (NS) 0.9 %  injection 3 mL, 3 mL, Intravenous, Q12H, Pokhrel, Laxman, MD, 3 mL at 02/27/23 2150   sodium chloride flush (NS) 0.9 % injection 3 mL, 3 mL, Intravenous, PRN, Pokhrel, Laxman, MD   zolpidem (AMBIEN) tablet 5 mg, 5 mg, Oral, QHS PRN, Pokhrel, Laxman, MD, 5 mg at 02/25/23 2113  Labs and Diagnostic Imaging   Autoimmune/infectious workup - LP, low volume due to right parietal mass lesion (which is not obstructing) RBC 31/4, WBC 3/4, Protein 76, glucose 38 (~50% of serum 76), meningitis/encephalitis panel negative,  gram stain neg (no organisms, mononuclear WBC) IgG index 1.1 Pending: OCB, cytology, fungal and bacterial cultures. CSF TB PCR - Serum labs:  - Quantiferon gold testing pending (negative 4 years ago) - Toxoplasma IgG 81.2, IgM 22.2, Toxo PCR not able to send yet due to insufficient CSF (per ID convo with lab this is in process, but to me lab reported QNS) - HIV (-), RPR (-) - CD4 390 (400 - 1790), CD4% 44.64 (Total lymphocyte count 874 (1000 - 4000), Cd8tox 44.16 (12-40%), CD8 T Cell Abs 386, Ratio 1.01  - JC Virus Titer pending (whole blood  qualitative negative, which is surprising given   positive given Feb 2020 Titer 3.28) - Anti-MOG antibody negative ([facility]) - CMV IgM 165 (down from >240 4 years ago); IgG > 10 - EBV, IgG VCA >600, IgG NA 288, IgM < 36 - bartonella negative - LDH 192 (normal)  - CK 8947 (11/06/22) --> 21 (12/11/22) --> 28 (02/26/23) - Aldolase < 2 - ESR 90 (11/04/22) --> 42 (11/08/22) --> 25 (02/26/23) - CRP 5.2 (11/05/22) --> 0.9 (11/08/22) --> 0.6 (02/26/23) - C3, C4 pending - Heavy metals blood negative - Volatiles (acetone,ethanol,isoprop,methanol) negative (ordered by primary team) - Blood cultures 02/26/2023 pending      Basic Metabolic Panel: Recent Labs  Lab 02/24/23 0521 02/25/23 0549 02/26/23 0647 02/27/23 0622 02/28/23 0650  NA 139 141 138 142 142  K 3.3* 3.8 3.6 3.7 4.7  CL 110 114* 111 113* 111  CO2 21* 20* 20* 21* 20*  GLUCOSE 92 101*  113* 89 125*  BUN 9 8 9 9  22*  CREATININE 0.89 0.82 0.89 1.03* 1.22*  CALCIUM 8.0* 8.2* 7.9* 8.0* 8.2*  MG 1.5* 1.9 1.8  --   --   PHOS 2.7  --   --   --   --     CBC: Recent Labs  Lab 02/23/23 1100 02/24/23 0521 02/26/23 0647 02/27/23 0622 02/28/23 0650  WBC 5.1 3.7* 4.9 4.6 1.6*  NEUTROABS 4.3  --   --   --   --   HGB 10.0* 9.6* 9.7* 9.5* 10.0*  HCT 32.1* 30.2* 30.1* 29.6* 31.0*  MCV 86.5 85.8 85.5 85.8 85.9  PLT 287 260 289 263 300    CT Head without contrast(Personally reviewed): Multifocal cerebral hypodensities, including a 5 cm edematous area  in the right parietal lobe. These are indeterminate with considerations including subacute ischemia, neoplasm, an infectious/inflammatory process, and tumefactive demyelination. A brain MRI without and with contrast is recommended for further evaluation.  CT angio Head and Neck with contrast(Personally reviewed): 1. Normal CTA of the head and neck. No large vessel occlusion, hemodynamically significant stenosis, or other acute vascular abnormality. No evidence for vasculitis. 2. Multiple prominent cervical, axillary, and mediastinal lymph nodes as above. Findings are nonspecific, and could possibly related to patient history of SLE. Reactive adenopathy due to an infectious or other inflammatory process could also be considered. 3. Few scattered pulmonary nodules measuring up to 6 mm within the visualized lungs. Findings are indeterminate, and could also be either infectious or inflammatory in nature. Per Fleischner Society Guidelines, recommend a non-contrast Chest CT at 3-6 months, then consider another non-contrast Chest CT at 18-24 months. If patient is low risk for malignancy, non-contrast Chest CT at 18-24 months is optional. These guidelines do not apply to immunocompromised patients and patients with cancer. Follow up in patients with significant comorbidities as clinically warranted. For lung cancer screening, adhere to Lung-RADS  guidelines. Reference: Radiology. 2017; 284(1):228-43.  MRI brain 02/26/2023 (Personally reviewed) 1. Multifocal areas of T2/FLAIR signal abnormality involving the cortical and subcortical aspects of the bilateral cerebral and cerebellar hemispheres, stable in number and distribution from prior, but overall slightly more prominent with increased size and progressive T2/FLAIR signal abnormality. Again, differential considerations are broad, and described on previous exams.  2. Increased mass effect by the dominant right occipital lesion on the posterior aspect of the right lateral ventricle, with new trace right-to-left shift at the septum pellucidum.  MRI Brain 02/23/2023 (Personally reviewed): Multifocal regions of predominantly cortical diffusion restriction with associated contrast enhancement,  involving the bilateral insular regions, right occipital lobe, and left cerebellum. Findings are nonspecific, but with predominantly cortical involvement, CNS vasculitis is a differential consideration. Given patient's history, an additional differential consideration is demyelinating disease. Recommend further evaluation with a CTA head and neck angiogram. Second personal review of MRI by Dr. Otelia Limes (Neurology): MRI reveals a medium sized subacute-appearing cortically based lesion with subjacent white matter involvement in the right occipital lobe that has patchy internal enhancement. Also noted is a lesion that is small and cortically based in the left anterior temporal lobe. Smaller cortically based right frontal lobe lesions also noted. On personal review of the images, there also appear to be bilateral subtle FLAIR-hyperintense cerebellar lesions. All of the above show some degree of diffusion restriction. Only the right occipital lobe lesion appears to enhance.   MRI brain 08/05/2022 (personally reviewed) Small number of T2/FLAIR hypertense foci in the cerebral hemispheres and 1 focus in the right upper pons  in a pattern consistent with chronic demyelinating plaque associated with multiple sclerosis.  None of the foci appear to be acute.  Compared to previous MRIs, there were no new lesions. Normal enhancement pattern.  No acute findings.  MRI C and T-spine w/ and w/o 02/24/2023 1. T2 hyperintense foci in the cervical spinal cord at C4-C5 and C6-C7, consistent with the patient's history of multiple sclerosis. No abnormal enhancement to suggest active demyelination.  2. No abnormal spinal cord signal or enhancement in the thoracic spine. 3. Epidural fluid collection in the thoracic spine, which causes moderate thecal sac narrowing and anterior displacement of the thecal sac, new from the prior exam. This may be the sequela of the recent lumbar puncture. Correlate with physical exam.  CT chest/abdomen/pelvis 1. Small bilateral pleural effusions with associated bibasilar atelectasis. 2. Scattered tiny pulmonary nodules measuring up to 5 mm, nonspecific. While pulmonary metastatic disease is not strictly excluded, these are more commonly the result of remote infection or inflammation in a patient of this age. 3. Shotty bilateral axillary and external iliac lymphadenopathy, nonspecific. No frankly pathologic adenopathy seen within the chest, abdomen, or pelvis. 4. Moderate hepatic steatosis. 5. Small hiatal hernia.  11/05/2022 MRI right and left femur 1. Severe, nearly symmetric T2 hyperintensity throughout the musculature of the lower pelvis and proximal thighs bilaterally consistent with myositis. There is asymmetric involvement of the left semitendinosus and biceps muscles. 2. No focal muscular atrophy or fluid collection identified. 3. No acute osseous findings. 4. Bilateral external and inguinal lymphadenopathy, further discussed on pelvic MRI.   rEEG 1/21:  - Continuous slow, generalized and maximal right parieto-occipital This study is suggestive of cortical dysfunction arising from right  parieto-occipital likely secondary to underlying structural abnormality. Additionally there is moderate diffuse encephalopathy. No seizures or definite epileptiform discharges were seen throughout the recording.  Assessment   38 y.o. female with a past medical history of Cervical intraepithelial neoplasia III, Gallstones, GERD (gastroesophageal reflux disease), History of blood transfusion (2020), Lupus (dx 2020), Multiple sclerosis (dx 2020), and Numbness and tingling of left leg who presents with a 2 week history of diffuse weakness and malaise in conjunction with headache, runny nose, nausea, congestion, and nose bleeds x1 week. Immunosuppressed on azathioprine and plaquenil Exam is overall stable and notable for left homonymous hemianopsia and mild weakness proximal > distal.  The left homonymous hemianopsia corresponds to the subacute medium-sized partially enhancing lesion seen on MRI.  Brain imaging is notable for marked new lesions since July 2024, with imaging findings much more  impressive than the patient's examination, multifocal as detailed above.  Differential is broad for these lesions given her immunosuppression and autoimmune history  Key differentials:  She has been positive for JC virus in the past, certainly the right parietal lesion is atypical for PML but some of the other lesions are more consistent with a classic PML appearance Also with azathioprine need to consider the possibility of an underlying lymphoma; she has new lymphadenopathy and pulmonary nodules on CT imaging, nonspecific; CT Chest/Abdomen/Pelvis does not show any clear pathological lymph nodes. S/p biopsy of an axillary lymph node on 1/24  In review with Dr. Epimenio Foot, neuro immunologist, tumefactive MS is felt to be lower in the differential, but will still consider this Anti-MOG negative, making MOG encephalitis less likely Less likely vasculitis given reassuring CTA and minimally elevated ESR (actually improved from  Oct 2024 when she had myositis) and minimally elevated CRP   Very unlikely fungal infection given CSF does not seem inflammatory Not having any active myositis contributing to her clinical picture given reassuring muscle enzymes despite continued thigh pain Headache may be due to post-LP CSF leak given worsening with positioning changes but given involvement of cerebellar vermis on last MRI brain, will consider repeat MRI if not improving with rest and steroids today  Recommendations  - Continue solumedrol 1 gram daily planned for 5 day course today -- noon today 1/24, then 8 AM on 1/25, then 6 AM 1/26,27,28 to avoid delays but achieve physiological timing - Glucose checks and LDSSI and pantoprazole while on steriods - Appreciate ID input, hold off on concurrent empiric toxoplasmosis treatment  - Will consider low volume repeat tap in the future: 2-3 cc to test for JCV PCR (0.2-0.5 cc required), Toxoplasma PCR (0.2-0.5 cc required), EBV PCR, CMV PCR -- cancelled for 1/24 due to worsening positional headache, concern for CSF leak  - Mirtazapine for sleep/anxiety/appetite stimulation 7.5 mg QHS - continue azathioprine and plaquenil - If symptoms not improving this evening may repeat MRI brain w/ and w/o contrast again given slight progression from earlier scan and more obvious motor neglect today with worsening headache - If MRI is negative may need blood patch for presumed post-LP headache (likely Monday with anesthesia) - Neurology will follow along  ______________________________________________________________________  Brooke Dare MD-PhD [facility] [phone] Available 7 AM to 7 PM, outside these hours please contact Neurologist on call listed on AMION

## 2023-02-28 NOTE — Progress Notes (Signed)
Progress Note   Patient: Emily Hicks ZOX:[id number] DOB: 1985-09-06 DOA: 02/23/2023     5 DOS: the patient was seen and examined on 02/28/2023   Brief hospital course: 38 y.o. female with past medical history significant for multiple sclerosis, lupus, GERD, cervical intraepithelial neoplasia, cholelithiasis who presented to [facility] ED on 1/28 with 2-week history of progressive generalized weakness, malaise, headache.  Also endorsed nasal congestion, nausea, nosebleeds.    Currently on Plaquenil and azathioprine for her lupus. Followed by neurology outpatient, Dr. Epimenio Foot but currently not on any additional disease modifying therapy for her MS currently.  Sister also endorses that she has been having a hard time "gripping" items.  She has been taking Excedrin without much relief regarding her headaches.  Denies any urinary symptoms, no visual changes, no fever, no chills, no rigors, no muscle aches.   In the ED, temperature 97.9 F, HR 107, RR 18, BP 175/119, SpO2 99% on room air.  WBC 5.1, hemoglobin 10.0, platelet count 287.  Sodium 142, potassium 3.8, chloride 112, CO2 22, glucose 90, BUN 12, creatinine 0.92.  Hemoglobin A1c 5.5.  hCG negative.  CT head without contrast with multifocal cerebral hypodensities including 5 cm edematous area right parietal lobe, indeterminate with considerations including subacute ischemic, neoplasm, infectious/inflammatory process, tumefactive demyelination.  Neurology was consulted and recommended transfer to [facility].  [facility] was consulted for admission and patient was transferred to [facility].  Assessment and Plan: Brain lesions  Patient presenting with headache, progressive generalized weakness over the last few weeks associated with malaise.  Currently immunosuppressed from her lupus on azathioprine and Plaquenil.  CT head without contrast on admission with multifocal cerebral hypodensities including 5 cm edematous area right parietal lobe.  MRI  brain with and without contrast with multifocal regions of cortical diffusion restriction associated with contrast-enhancement bilateral insular regions, right occipital lobe, left cerebellum.  Imaging findings concerning for inflammatory vs CNS vasculitis vs CVA vs lupus cerebritis vs tumefactive MS vs infection.  CT angiogram head/neck with no large vessel occlusion, no stenosis or other acute vascular abnormality, no evidence for vasculitis, multiple prominent cervical, axillary, mediastinal lymph nodes, few scattered pulmonary nodules.  MR T/L-spine with T2 hyperintense foci cervical spine cord C4-5, C6/7 consistent with known MS, no abnormal enhancement to suggest active demyelination, no abnormal spinal cord signal or enhancement in the thoracic spine, epidural fluid collection thoracic spine likely from recent LP.  Underwent lumbar puncture 02/24/2023 by IR with initial fluid analysis not consistent with infection; meningitis/encephalitis panel negative.  EEG with generalized and maximal right parieto-occipital slowing suggestive of cortical dysfunction likely secondary to underlying structural normality, no seizure/epileptiform discharges noted. -- Neurology and infectious disease are following -- Toxoplasma Ab elevated> IgM 22.2, IgG 81.2; per ID not concerning for active infection given appearance of brain lesions on MRI -- RPR nonreactive -- CSF toxoplasma PCR: Pending -- CSF cytology: No malignant cells -- CSF oligoclonal bands: Pending -- CSF IgG: Pending -- CSF culture no organism on Gram stain; no growth -- CSF fungal culture: Neg -- CSF JC virus DNA PCR: pos --CSF MTB -RIF NAA pending -- HIV antibody: NR -- anti-MOG testing: neg -- EBV pending --CMV serologies were pos. Per ID, difficult to destinguish if a true pos --Bartonella antibody pending,  --CK unremarkable -, aldolase pending --, LDH normal -- ESR 25, , CRP 0.6 --, C3/4 complement: Pending  - CT chest/abdomen/pelvis with  contrast reviewed with scatterred tiny pulm nodules up to 5mm  and shotty B axillary lymph nodes -Pt now s/p axillary node biopsy, pending -consideration for repeat if not improving   Pulmonary nodules Scattered pulmonary nodules noted incidentally on CT angiogram head/neck. --axillary nodes on CT noted. _ -Pt now s/p axillary node biopsy, pending results   Lymphadenopathy CT angiogram head/neck with findings of multiple prominent cervical, axillary, mediastinal lymph nodes. -- IR was consulted for node biopsy   Hypokalemia Hypomagnesemia -within normal limits   History of multiple sclerosis, lupus Follows with neurology outpatient, Dr. Epimenio Foot -- Continue Plaquenil 200mg  PO BID, azathioprine    Essential hypertension -- Metoprolol succinate 25 mg p.o. daily -- Hydralazine 10 mg p.o. q6h PRN SBP >165   Vitamin D deficiency Vitamin D 25-hydroxy level 24.72.  Currently not taking ergocalciferol as reported at home. -- Cholecalciferol 1000 units p.o. daily   GERD -- Protonix 40 mg p.o. daily   Obesity, class III Body mass index is 36.84 kg/m.  Complicates all facets of care   Subjective: Without complaints today.   Physical Exam: Vitals:   02/28/23 0858 02/28/23 1113 02/28/23 1240 02/28/23 1300  BP: 127/89 (!) 142/100 (!) 136/101 (!) 138/103  Pulse: 89 91 95   Resp:  17    Temp:  97.8 F (36.6 C)    TempSrc:  Oral    SpO2:  100% 99%   Weight:      Height:       General exam: Conversant, in no acute distress Respiratory system: normal chest rise, clear, no audible wheezing Cardiovascular system: regular rhythm, s1-s2 Gastrointestinal system: Nondistended, nontender, pos BS Central nervous system: No seizures, no tremors Extremities: No cyanosis, no joint deformities Skin: No rashes, no pallor Psychiatry: Affect normal // no auditory hallucinations   Data Reviewed:  Labs reviewed: Na 142, K 4.7, Cr 1.22, WBC 1.6, Hgb 10.0, Plts 300  Family Communication: Pt in  room, family at bedside  Disposition: Status is: Inpatient Remains inpatient appropriate because: Severity of illness  Planned Discharge Destination: Home    Author: Rickey Barbara, MD 02/28/2023 3:40 PM  For on call review [email or web address].

## 2023-03-01 DIAGNOSIS — G35 Multiple sclerosis: Secondary | ICD-10-CM | POA: Diagnosis not present

## 2023-03-01 DIAGNOSIS — R519 Headache, unspecified: Secondary | ICD-10-CM | POA: Diagnosis not present

## 2023-03-01 DIAGNOSIS — G939 Disorder of brain, unspecified: Secondary | ICD-10-CM | POA: Diagnosis not present

## 2023-03-01 LAB — CBC
HCT: 30 % — ABNORMAL LOW (ref 36.0–46.0)
Hemoglobin: 9.5 g/dL — ABNORMAL LOW (ref 12.0–15.0)
MCH: 27.4 pg (ref 26.0–34.0)
MCHC: 31.7 g/dL (ref 30.0–36.0)
MCV: 86.5 fL (ref 80.0–100.0)
Platelets: 304 10*3/uL (ref 150–400)
RBC: 3.47 MIL/uL — ABNORMAL LOW (ref 3.87–5.11)
RDW: 13.9 % (ref 11.5–15.5)
WBC: 3.4 10*3/uL — ABNORMAL LOW (ref 4.0–10.5)
nRBC: 0 % (ref 0.0–0.2)

## 2023-03-01 LAB — COMPREHENSIVE METABOLIC PANEL
ALT: 6 U/L (ref 0–44)
AST: 12 U/L — ABNORMAL LOW (ref 15–41)
Albumin: 2.5 g/dL — ABNORMAL LOW (ref 3.5–5.0)
Alkaline Phosphatase: 43 U/L (ref 38–126)
Anion gap: 6 (ref 5–15)
BUN: 34 mg/dL — ABNORMAL HIGH (ref 6–20)
CO2: 20 mmol/L — ABNORMAL LOW (ref 22–32)
Calcium: 7.8 mg/dL — ABNORMAL LOW (ref 8.9–10.3)
Chloride: 116 mmol/L — ABNORMAL HIGH (ref 98–111)
Creatinine, Ser: 1.6 mg/dL — ABNORMAL HIGH (ref 0.44–1.00)
GFR, Estimated: 43 mL/min — ABNORMAL LOW (ref >60)
Glucose, Bld: 123 mg/dL — ABNORMAL HIGH (ref 70–99)
Potassium: 4.5 mmol/L (ref 3.5–5.1)
Sodium: 142 mmol/L (ref 135–145)
Total Bilirubin: 0.5 mg/dL (ref 0.0–1.2)
Total Protein: 5.9 g/dL — ABNORMAL LOW (ref 6.5–8.1)

## 2023-03-01 LAB — GLUCOSE, CAPILLARY
Glucose-Capillary: 113 mg/dL — ABNORMAL HIGH (ref 70–99)
Glucose-Capillary: 128 mg/dL — ABNORMAL HIGH (ref 70–99)
Glucose-Capillary: 146 mg/dL — ABNORMAL HIGH (ref 70–99)
Glucose-Capillary: 156 mg/dL — ABNORMAL HIGH (ref 70–99)

## 2023-03-01 LAB — ANAEROBIC CULTURE W GRAM STAIN

## 2023-03-01 MED ORDER — MIRTAZAPINE 15 MG PO TBDP: 7.5000 mg | ORAL_TABLET | Freq: Every evening | ORAL | Status: DC | PRN

## 2023-03-01 MED ORDER — PREDNISONE 20 MG PO TABS: 60.0000 mg | ORAL_TABLET | Freq: Every day | ORAL | Status: DC

## 2023-03-01 MED ORDER — LACTATED RINGERS IV SOLN: INTRAVENOUS | Status: AC

## 2023-03-01 MED ORDER — OYSTER SHELL CALCIUM/D3 500-5 MG-MCG PO TABS
1.0000 | ORAL_TABLET | Freq: Every day | ORAL | Status: DC
  Administered 2023-03-02 – 2023-03-03 (×2): 1 via ORAL
  Filled 2023-03-01 (×2): qty 1

## 2023-03-01 NOTE — Progress Notes (Signed)
NEUROLOGY CONSULT FOLLOW UP NOTE   Date of service: March 01, 2023 Patient Name: Emily Hicks MRN:  [id number] DOB:  [birth year]   Brief HPI   38 year old woman who was diagnosed with MS in early 2020 but has had symptoms since 2010. She also has SLE.  Currently on azathioprine 150 mg daily and HCQ 200 mg po bid.    Not currently on a biologic but was on Saphnelo , an IFN-1 receptor antagonist   Also per notes previously tried [place] and took only one week before having a hospitalization for fever, hypernatremia, joint inflammation, thrush) and it was stopped (LFT elevated on this medication) Last admission 11/2022, diagnosed with myositis attributed to her SLE and started on prednisone at that time. Patient did report to she is no longer on prednisone she thinks she stopped sometime in Dec   Prior testing for DMARD safety labs includes negative TB and JCV positive > 1.5   Interval Hx/subjective  - Improving daily - No more confusion per family (which was mild at best 2-3 days ago)  Vitals   Vitals:   03/01/23 0406 03/01/23 0500 03/01/23 0737 03/01/23 1113  BP: (!) 133/98  (!) 137/106 (!) 149/108  Pulse: 84  84 81  Resp: 18  17 17   Temp: 97.6 F (36.4 C)  97.6 F (36.4 C) 97.8 F (36.6 C)  TempSrc: Oral  Oral Oral  SpO2: 100%  100% 100%  Weight:  87.8 kg    Height:         Body mass index is 36.58 kg/m.  Physical Exam   Constitutional: No acute distress, fatigued appearing Psych: Affect calm, cooperative  Eyes: No scleral injection.  HENT: No OP obstrucion.  Head: Normocephalic.  Cardiovascular: Normal rate and regular rhythm.  Respiratory: Effort normal, non-labored breathing GI: Soft.  No distension. There is no tenderness.  Skin: WDI.   Neurologic Examination   Neuro:  Mental Status: Awake, alert, oriented, able to give a clear and coherent history, including details of medications, good understanding of prior discussions, asks appropriate questions about  care plan No motor neglect today Cranial Nerves: II: Visual Fields with MINIMAL left hemianopia in the periphery only -- about to count fingers well in all quadrants. Pupils are equal, round, and reactive to light.   III,IV, VI: EOMI without ptosis or diploplia.  V: Facial sensation is symmetric to light touch VII: Facial movement is symmetric VIII: hearing is intact to voice X: Uvula incompletely visualized XI: Shoulder shrug is symmetric. XII: tongue is midline without atrophy or fasciculations.  Motor: Using all extremities equally and freely   Medications  Current Facility-Administered Medications:    acetaminophen (TYLENOL) tablet 650 mg, 650 mg, Oral, Q6H PRN, 650 mg at 02/28/23 0859 **OR** acetaminophen (TYLENOL) suppository 650 mg, 650 mg, Rectal, Q6H PRN, Pokhrel, Laxman, MD   azaTHIOprine (IMURAN) tablet 150 mg, 150 mg, Oral, Daily, [place], Eric J, DO, 150 mg at 03/01/23 1026   cholecalciferol (VITAMIN D3) 25 MCG (1000 UNIT) tablet 1,000 Units, 1,000 Units, Oral, Daily, [place], Alvira Philips, DO, 1,000 Units at 03/01/23 1026   clobetasol ointment (TEMOVATE) 0.05 % 1 Application, 1 Application, Topical, Daily PRN, Pokhrel, Laxman, MD   enoxaparin (LOVENOX) injection 40 mg, 40 mg, Subcutaneous, Q24H, Reome, Earle J, RPH, 40 mg at 03/01/23 1026   gabapentin (NEURONTIN) capsule 300 mg, 300 mg, Oral, TID PRN, Pokhrel, Laxman, MD   hydrALAZINE (APRESOLINE) injection 10 mg, 10 mg, Intravenous, Q6H PRN, Jerald Kief, MD,  10 mg at 02/28/23 1610   HYDROcodone-acetaminophen (NORCO/VICODIN) 5-325 MG per tablet 1-2 tablet, 1-2 tablet, Oral, Q4H PRN, Pokhrel, Laxman, MD, 2 tablet at 02/27/23 9604   hydroxychloroquine (PLAQUENIL) tablet 200 mg, 200 mg, Oral, BID, Pokhrel, Laxman, MD, 200 mg at 03/01/23 1026   insulin aspart (novoLOG) injection 0-9 Units, 0-9 Units, Subcutaneous, TID WC, Shainna Faux L, MD, 1 Units at 03/01/23 1214   ketoconazole (NIZORAL) 2 % shampoo 1 Application, 1  Application, Topical, Weekly, Pokhrel, Laxman, MD   lactated ringers infusion, , Intravenous, Continuous, Jerald Kief, MD, Last Rate: 75 mL/hr at 03/01/23 0809, New Bag at 03/01/23 0809   [COMPLETED] methylPREDNISolone sodium succinate (SOLU-MEDROL) 1,000 mg in sodium chloride 0.9 % 50 mL IVPB, 1,000 mg, Intravenous, Q24H, Last Rate: 66 mL/hr at 02/27/23 1247, 1,000 mg at 02/27/23 1247 **FOLLOWED BY** [COMPLETED] methylPREDNISolone sodium succinate (SOLU-MEDROL) 1,000 mg in sodium chloride 0.9 % 50 mL IVPB, 1,000 mg, Intravenous, Q24H, Last Rate: 66 mL/hr at 02/28/23 0806, 1,000 mg at 02/28/23 0806 **FOLLOWED BY** [COMPLETED] methylPREDNISolone sodium succinate (SOLU-MEDROL) 1,000 mg in sodium chloride 0.9 % 50 mL IVPB, 1,000 mg, Intravenous, Q24H, Last Rate: 66 mL/hr at 03/01/23 0657, 1,000 mg at 03/01/23 0657 **FOLLOWED BY** [START ON 03/02/2023] methylPREDNISolone sodium succinate (SOLU-MEDROL) 1,000 mg in sodium chloride 0.9 % 50 mL IVPB, 1,000 mg, Intravenous, Q24H, Hadlei Stitt L, MD   metoprolol succinate (TOPROL-XL) 24 hr tablet 50 mg, 50 mg, Oral, QHS, Jerald Kief, MD, 50 mg at 02/28/23 2118   mirtazapine (REMERON SOL-TAB) disintegrating tablet 7.5 mg, 7.5 mg, Oral, QHS, Kalea Perine L, MD, 7.5 mg at 02/28/23 2118   ondansetron (ZOFRAN) tablet 4 mg, 4 mg, Oral, Q6H PRN, 4 mg at 02/28/23 0900 **OR** ondansetron (ZOFRAN) injection 4 mg, 4 mg, Intravenous, Q6H PRN, Pokhrel, Laxman, MD, 4 mg at 02/26/23 1330   pantoprazole (PROTONIX) EC tablet 40 mg, 40 mg, Oral, Daily, Pokhrel, Laxman, MD, 40 mg at 03/01/23 1026   polyethylene glycol (MIRALAX / GLYCOLAX) packet 17 g, 17 g, Oral, Daily PRN, Pokhrel, Laxman, MD   sodium chloride flush (NS) 0.9 % injection 3 mL, 3 mL, Intravenous, Q12H, Pokhrel, Laxman, MD, 3 mL at 02/28/23 2118   sodium chloride flush (NS) 0.9 % injection 3 mL, 3 mL, Intravenous, PRN, Pokhrel, Laxman, MD   zolpidem (AMBIEN) tablet 5 mg, 5 mg, Oral, QHS PRN, Pokhrel,  Laxman, MD, 5 mg at 02/25/23 2113  Labs and Diagnostic Imaging   Autoimmune/infectious workup - LP, low volume due to right parietal mass lesion (which is not obstructing) RBC 31/4, WBC 3/4, Protein 76, glucose 38 (~50% of serum 76), meningitis/encephalitis panel negative,  gram stain neg (no organisms, mononuclear WBC) IgG index 1.1 Pending: OCB, cytology, fungal and bacterial cultures. CSF TB PCR - Serum labs:  - Quantiferon gold testing pending (negative 4 years ago) - Toxoplasma IgG 81.2, IgM 22.2, Toxo PCR not able to send yet due to insufficient CSF (per ID convo with lab this is in process, but to me lab reported QNS) - HIV (-), RPR (-) - CD4 390 (400 - 1790), CD4% 44.64 (Total lymphocyte count 874 (1000 - 4000), Cd8tox 44.16 (12-40%), CD8 T Cell Abs 386, Ratio 1.01  - JC Virus Titer pending (whole blood qualitative negative, which is surprising given   positive given Feb 2020 Titer 3.28) - Anti-MOG antibody negative ([facility]) - CMV IgM 165 (down from >240 4 years ago); IgG > 10 - EBV, IgG VCA >600, IgG NA 288, IgM <  36 - bartonella negative - LDH 192 (normal)  - CK 8947 (11/06/22) --> 21 (12/11/22) --> 28 (02/26/23) - Aldolase < 2 - ESR 90 (11/04/22) --> 42 (11/08/22) --> 25 (02/26/23) - CRP 5.2 (11/05/22) --> 0.9 (11/08/22) --> 0.6 (02/26/23) - C3, C4 pending - Heavy metals blood negative - Volatiles (acetone,ethanol,isoprop,methanol) negative (ordered by primary team) - Blood cultures 02/26/2023 pending      Basic Metabolic Panel: Recent Labs  Lab 02/24/23 0521 02/25/23 0549 02/26/23 0647 02/27/23 0622 02/28/23 0650 03/01/23 0639  NA 139 141 138 142 142 142  K 3.3* 3.8 3.6 3.7 4.7 4.5  CL 110 114* 111 113* 111 116*  CO2 21* 20* 20* 21* 20* 20*  GLUCOSE 92 101* 113* 89 125* 123*  BUN 9 8 9 9  22* 34*  CREATININE 0.89 0.82 0.89 1.03* 1.22* 1.60*  CALCIUM 8.0* 8.2* 7.9* 8.0* 8.2* 7.8*  MG 1.5* 1.9 1.8  --   --   --   PHOS 2.7  --   --   --   --   --     CBC: Recent  Labs  Lab 02/23/23 1100 02/24/23 0521 02/26/23 0647 02/27/23 0622 02/28/23 0650 03/01/23 0639  WBC 5.1 3.7* 4.9 4.6 1.6* 3.4*  NEUTROABS 4.3  --   --   --   --   --   HGB 10.0* 9.6* 9.7* 9.5* 10.0* 9.5*  HCT 32.1* 30.2* 30.1* 29.6* 31.0* 30.0*  MCV 86.5 85.8 85.5 85.8 85.9 86.5  PLT 287 260 289 263 300 304    CT Head without contrast(Personally reviewed): Multifocal cerebral hypodensities, including a 5 cm edematous area  in the right parietal lobe. These are indeterminate with considerations including subacute ischemia, neoplasm, an infectious/inflammatory process, and tumefactive demyelination. A brain MRI without and with contrast is recommended for further evaluation.  CT angio Head and Neck with contrast(Personally reviewed): 1. Normal CTA of the head and neck. No large vessel occlusion, hemodynamically significant stenosis, or other acute vascular abnormality. No evidence for vasculitis. 2. Multiple prominent cervical, axillary, and mediastinal lymph nodes as above. Findings are nonspecific, and could possibly related to patient history of SLE. Reactive adenopathy due to an infectious or other inflammatory process could also be considered. 3. Few scattered pulmonary nodules measuring up to 6 mm within the visualized lungs. Findings are indeterminate, and could also be either infectious or inflammatory in nature. Per Fleischner Society Guidelines, recommend a non-contrast Chest CT at 3-6 months, then consider another non-contrast Chest CT at 18-24 months. If patient is low risk for malignancy, non-contrast Chest CT at 18-24 months is optional. These guidelines do not apply to immunocompromised patients and patients with cancer. Follow up in patients with significant comorbidities as clinically warranted. For lung cancer screening, adhere to Lung-RADS guidelines. Reference: Radiology. 2017; 284(1):228-43.  MRI brain 02/26/2023 (Personally reviewed) 1. Multifocal areas of T2/FLAIR signal  abnormality involving the cortical and subcortical aspects of the bilateral cerebral and cerebellar hemispheres, stable in number and distribution from prior, but overall slightly more prominent with increased size and progressive T2/FLAIR signal abnormality. Again, differential considerations are broad, and described on previous exams.  2. Increased mass effect by the dominant right occipital lesion on the posterior aspect of the right lateral ventricle, with new trace right-to-left shift at the septum pellucidum.  MRI Brain 02/23/2023 (Personally reviewed): Multifocal regions of predominantly cortical diffusion restriction with associated contrast enhancement, involving the bilateral insular regions, right occipital lobe, and left cerebellum. Findings are nonspecific, but with  predominantly cortical involvement, CNS vasculitis is a differential consideration. Given patient's history, an additional differential consideration is demyelinating disease. Recommend further evaluation with a CTA head and neck angiogram. Second personal review of MRI by Dr. Otelia Limes (Neurology): MRI reveals a medium sized subacute-appearing cortically based lesion with subjacent white matter involvement in the right occipital lobe that has patchy internal enhancement. Also noted is a lesion that is small and cortically based in the left anterior temporal lobe. Smaller cortically based right frontal lobe lesions also noted. On personal review of the images, there also appear to be bilateral subtle FLAIR-hyperintense cerebellar lesions. All of the above show some degree of diffusion restriction. Only the right occipital lobe lesion appears to enhance.   MRI brain 08/05/2022 (personally reviewed) Small number of T2/FLAIR hypertense foci in the cerebral hemispheres and 1 focus in the right upper pons in a pattern consistent with chronic demyelinating plaque associated with multiple sclerosis.  None of the foci appear to be acute.   Compared to previous MRIs, there were no new lesions. Normal enhancement pattern.  No acute findings.  MRI C and T-spine w/ and w/o 02/24/2023 1. T2 hyperintense foci in the cervical spinal cord at C4-C5 and C6-C7, consistent with the patient's history of multiple sclerosis. No abnormal enhancement to suggest active demyelination.  2. No abnormal spinal cord signal or enhancement in the thoracic spine. 3. Epidural fluid collection in the thoracic spine, which causes moderate thecal sac narrowing and anterior displacement of the thecal sac, new from the prior exam. This may be the sequela of the recent lumbar puncture. Correlate with physical exam.  CT chest/abdomen/pelvis 1. Small bilateral pleural effusions with associated bibasilar atelectasis. 2. Scattered tiny pulmonary nodules measuring up to 5 mm, nonspecific. While pulmonary metastatic disease is not strictly excluded, these are more commonly the result of remote infection or inflammation in a patient of this age. 3. Shotty bilateral axillary and external iliac lymphadenopathy, nonspecific. No frankly pathologic adenopathy seen within the chest, abdomen, or pelvis. 4. Moderate hepatic steatosis. 5. Small hiatal hernia.  11/05/2022 MRI right and left femur 1. Severe, nearly symmetric T2 hyperintensity throughout the musculature of the lower pelvis and proximal thighs bilaterally consistent with myositis. There is asymmetric involvement of the left semitendinosus and biceps muscles. 2. No focal muscular atrophy or fluid collection identified. 3. No acute osseous findings. 4. Bilateral external and inguinal lymphadenopathy, further discussed on pelvic MRI.   rEEG 1/21:  - Continuous slow, generalized and maximal right parieto-occipital This study is suggestive of cortical dysfunction arising from right parieto-occipital likely secondary to underlying structural abnormality. Additionally there is moderate diffuse encephalopathy. No seizures  or definite epileptiform discharges were seen throughout the recording.  Assessment   38 y.o. female with a past medical history of Cervical intraepithelial neoplasia III, Gallstones, GERD (gastroesophageal reflux disease), History of blood transfusion (2020), Lupus (dx 2020), Multiple sclerosis (dx 2020), and Numbness and tingling of left leg who presents with a 2 week history of diffuse weakness and malaise in conjunction with headache, runny nose, nausea, congestion, and nose bleeds x1 week. Immunosuppressed on azathioprine and plaquenil Exam is overall stable and notable for left homonymous hemianopsia and mild weakness proximal > distal.  The left homonymous hemianopsia corresponds to the subacute medium-sized partially enhancing lesion seen on MRI.  Brain imaging is notable for marked new lesions since July 2024, with imaging findings much more impressive than the patient's examination, multifocal as detailed above.  Differential is broad for these lesions  given her immunosuppression and autoimmune history  Key differentials:  She has been positive for JC virus in the past, certainly the right parietal lesion is atypical for PML but some of the other lesions are more consistent with a classic PML appearance Also with azathioprine need to consider the possibility of an underlying lymphoma; she has new lymphadenopathy and pulmonary nodules on CT imaging, nonspecific; CT Chest/Abdomen/Pelvis does not show any clear pathological lymph nodes. S/p biopsy of an axillary lymph node on 1/24  In review with Dr. Epimenio Foot, neuro immunologist, tumefactive MS is felt to be lower in the differential, but will still consider this Anti-MOG negative, making MOG encephalitis less likely Less likely vasculitis given reassuring CTA and minimally elevated ESR (actually improved from Oct 2024 when she had myositis) and minimally elevated CRP   Very unlikely fungal infection given CSF does not seem inflammatory Not  having any active myositis contributing to her clinical picture given reassuring muscle enzymes despite continued thigh pain Headache may be due to post-LP CSF leak given worsening with positioning changes but given involvement of cerebellar vermis on last MRI brain, will consider repeat MRI if not improving with rest and steroids today  Recommendations  - continue azathioprine and plaquenil - Continue solumedrol 1 gram daily planned for 5 day course -- noon today 1/24, then 8 AM on 1/25, then 6 AM 1/26,27,28 to avoid delays but achieve physiological timing - Glucose checks and LDSSI and pantoprazole while on steroids - Would not repeat LP at this time given improving symptoms; however if she worsens would consider the following studies: JCV PCR, Toxoplasma PCR, EBV PCR, CMV PCR  - Mirtazapine for sleep/anxiety/appetite stimulation 7.5 mg at bedtime, can be tapered (made PRN, if patient prefers over [place]) - Prednisone 60 mg daily starting 1/29, continue through outpatient follow-up with rheumatology/neurology (ordered)  - Vitamin D - calcium while on steroids (ordered in addition to 1000 U Vit D already taking) - Consider PJP prophylaxis (to be discussed with outpatient providers) if she remains on higher dose steroids for >1-2 months   - Neurology will sign off at this time given her exam is now near normal and no further workup is needed inpatient, notify us if patient is worsening or new questions/concerns arise  - Discussed with Dr. Rhona Leavens via secure chat  ______________________________________________________________________  Brooke Dare MD-PhD [facility] [phone] Available 7 AM to 7 PM, outside these hours please contact Neurologist on call listed on AMION

## 2023-03-01 NOTE — Plan of Care (Signed)

## 2023-03-01 NOTE — Progress Notes (Signed)
Progress Note   Patient: Emily Hicks UXN:[id number] DOB: 1985/12/05 DOA: 02/23/2023     6 DOS: the patient was seen and examined on 03/01/2023   Brief hospital course: 38 y.o. female with past medical history significant for multiple sclerosis, lupus, GERD, cervical intraepithelial neoplasia, cholelithiasis who presented to [facility] ED on 1/28 with 2-week history of progressive generalized weakness, malaise, headache.  Also endorsed nasal congestion, nausea, nosebleeds.    Currently on Plaquenil and azathioprine for her lupus. Followed by neurology outpatient, Dr. Epimenio Foot but currently not on any additional disease modifying therapy for her MS currently.  Sister also endorses that she has been having a hard time "gripping" items.  She has been taking Excedrin without much relief regarding her headaches.  Denies any urinary symptoms, no visual changes, no fever, no chills, no rigors, no muscle aches.   In the ED, temperature 97.9 F, HR 107, RR 18, BP 175/119, SpO2 99% on room air.  WBC 5.1, hemoglobin 10.0, platelet count 287.  Sodium 142, potassium 3.8, chloride 112, CO2 22, glucose 90, BUN 12, creatinine 0.92.  Hemoglobin A1c 5.5.  hCG negative.  CT head without contrast with multifocal cerebral hypodensities including 5 cm edematous area right parietal lobe, indeterminate with considerations including subacute ischemic, neoplasm, infectious/inflammatory process, tumefactive demyelination.  Neurology was consulted and recommended transfer to [facility].  [facility] was consulted for admission and patient was transferred to [facility].  Assessment and Plan: Brain lesions  Patient presenting with headache, progressive generalized weakness over the last few weeks associated with malaise.  Currently immunosuppressed from her lupus on azathioprine and Plaquenil.  CT head without contrast on admission with multifocal cerebral hypodensities including 5 cm edematous area right parietal lobe.  MRI  brain with and without contrast with multifocal regions of cortical diffusion restriction associated with contrast-enhancement bilateral insular regions, right occipital lobe, left cerebellum.  Imaging findings concerning for inflammatory vs CNS vasculitis vs CVA vs lupus cerebritis vs tumefactive MS vs infection.  CT angiogram head/neck with no large vessel occlusion, no stenosis or other acute vascular abnormality, no evidence for vasculitis, multiple prominent cervical, axillary, mediastinal lymph nodes, few scattered pulmonary nodules.  MR T/L-spine with T2 hyperintense foci cervical spine cord C4-5, C6/7 consistent with known MS, no abnormal enhancement to suggest active demyelination, no abnormal spinal cord signal or enhancement in the thoracic spine, epidural fluid collection thoracic spine likely from recent LP.  Underwent lumbar puncture 02/24/2023 by IR with initial fluid analysis not consistent with infection; meningitis/encephalitis panel negative.  EEG with generalized and maximal right parieto-occipital slowing suggestive of cortical dysfunction likely secondary to underlying structural normality, no seizure/epileptiform discharges noted. -- Neurology and infectious disease are following -- Toxoplasma Ab elevated> IgM 22.2, IgG 81.2; per ID not concerning for active infection given appearance of brain lesions on MRI -- RPR nonreactive -- CSF toxoplasma PCR: Pending -- CSF cytology: No malignant cells -- CSF oligoclonal bands: Pending -- CSF IgG: Pending -- CSF culture no organism on Gram stain; no growth -- CSF fungal culture: Neg -- CSF JC virus DNA PCR: pos --CSF MTB -RIF NAA pending -- HIV antibody: NR -- anti-MOG testing: neg -- EBV pending --CMV serologies were pos. Per ID, difficult to destinguish if a true pos --Bartonella antibody pending,  --CK unremarkable -, aldolase pending --, LDH normal -- ESR 25, , CRP 0.6 --, C3/4 complement: Pending  - CT chest/abdomen/pelvis with  contrast reviewed with scatterred tiny pulm nodules up to 5mm  and shotty B axillary lymph nodes -Pt now s/p axillary node biopsy, pending -Neurology recs for mirtazapine for sleep/anxiety/appetite 7.5mg  at bedtime Rec for Prednisone 60mg  every day starting 1/29 cont through outpt f/u with rheum/neurology -consider PJP prophylaxis to be determined by outpatient providers if she remains on higher dose of steroids >1-2 mos   Pulmonary nodules Scattered pulmonary nodules noted incidentally on CT angiogram head/neck. --axillary nodes on CT noted. _ -Pt now s/p axillary node biopsy, pending results   Lymphadenopathy CT angiogram head/neck with findings of multiple prominent cervical, axillary, mediastinal lymph nodes. -- IR was consulted for node biopsy   Hypokalemia Hypomagnesemia -within normal limits   History of multiple sclerosis, lupus Follows with neurology outpatient, Dr. Epimenio Foot -- Continue Plaquenil 200mg  PO BID, azathioprine    Essential hypertension -- Metoprolol succinate 25 mg p.o. daily -- Hydralazine 10 mg p.o. q6h PRN SBP >165   Vitamin D deficiency Vitamin D 25-hydroxy level 24.72.  Currently not taking ergocalciferol as reported at home. -- Cholecalciferol 1000 units p.o. daily   GERD -- Protonix 40 mg p.o. daily   Obesity, class III Body mass index is 36.84 kg/m.  Complicates all facets of care   Subjective: reports feeling stronger  Physical Exam: Vitals:   03/01/23 0500 03/01/23 0737 03/01/23 1113 03/01/23 1628  BP:  (!) 137/106 (!) 149/108 (!) 135/97  Pulse:  84 81 90  Resp:  17 17 17   Temp:  97.6 F (36.4 C) 97.8 F (36.6 C) 99.2 F (37.3 C)  TempSrc:  Oral Oral Oral  SpO2:  100% 100% 100%  Weight: 87.8 kg     Height:       General exam: Awake, laying in bed, in nad Respiratory system: Normal respiratory effort, no wheezing Cardiovascular system: regular rate, s1, s2 Gastrointestinal system: Soft, nondistended, positive BS Central nervous  system: CN2-12 grossly intact, strength intact Extremities: Perfused, no clubbing Skin: Normal skin turgor, no notable skin lesions seen Psychiatry: Mood normal // no visual hallucinations   Data Reviewed:  Labs reviewed: Na 142, K 4.5, Cr 1.60, WBC 3.4, Hgb 9.5  Family Communication: Pt in room, family at bedside  Disposition: Status is: Inpatient Remains inpatient appropriate because: Severity of illness  Planned Discharge Destination: Home    Author: Rickey Barbara, MD 03/01/2023 5:08 PM  For on call review [email or web address].

## 2023-03-02 DIAGNOSIS — G35 Multiple sclerosis: Secondary | ICD-10-CM | POA: Diagnosis not present

## 2023-03-02 DIAGNOSIS — R519 Headache, unspecified: Secondary | ICD-10-CM | POA: Diagnosis not present

## 2023-03-02 DIAGNOSIS — G939 Disorder of brain, unspecified: Secondary | ICD-10-CM | POA: Diagnosis not present

## 2023-03-02 LAB — CBC
HCT: 29 % — ABNORMAL LOW (ref 36.0–46.0)
Hemoglobin: 9.1 g/dL — ABNORMAL LOW (ref 12.0–15.0)
MCH: 27.5 pg (ref 26.0–34.0)
MCHC: 31.4 g/dL (ref 30.0–36.0)
MCV: 87.6 fL (ref 80.0–100.0)
Platelets: 266 10*3/uL (ref 150–400)
RBC: 3.31 MIL/uL — ABNORMAL LOW (ref 3.87–5.11)
RDW: 14.1 % (ref 11.5–15.5)
WBC: 5 10*3/uL (ref 4.0–10.5)
nRBC: 0.4 % — ABNORMAL HIGH (ref 0.0–0.2)

## 2023-03-02 LAB — COMPREHENSIVE METABOLIC PANEL
ALT: 6 U/L (ref 0–44)
AST: 10 U/L — ABNORMAL LOW (ref 15–41)
Albumin: 2.4 g/dL — ABNORMAL LOW (ref 3.5–5.0)
Alkaline Phosphatase: 34 U/L — ABNORMAL LOW (ref 38–126)
Anion gap: 8 (ref 5–15)
BUN: 37 mg/dL — ABNORMAL HIGH (ref 6–20)
CO2: 20 mmol/L — ABNORMAL LOW (ref 22–32)
Calcium: 7.7 mg/dL — ABNORMAL LOW (ref 8.9–10.3)
Chloride: 115 mmol/L — ABNORMAL HIGH (ref 98–111)
Creatinine, Ser: 1.21 mg/dL — ABNORMAL HIGH (ref 0.44–1.00)
GFR, Estimated: 60 mL/min — ABNORMAL LOW (ref >60)
Glucose, Bld: 118 mg/dL — ABNORMAL HIGH (ref 70–99)
Potassium: 4.5 mmol/L (ref 3.5–5.1)
Sodium: 143 mmol/L (ref 135–145)
Total Bilirubin: 0.5 mg/dL (ref 0.0–1.2)
Total Protein: 5.7 g/dL — ABNORMAL LOW (ref 6.5–8.1)

## 2023-03-02 LAB — GLUCOSE, CAPILLARY
Glucose-Capillary: 114 mg/dL — ABNORMAL HIGH (ref 70–99)
Glucose-Capillary: 97 mg/dL (ref 70–99)
Glucose-Capillary: 135 mg/dL — ABNORMAL HIGH (ref 70–99)

## 2023-03-02 MED ORDER — SULFAMETHOXAZOLE-TRIMETHOPRIM 800-160 MG PO TABS
1.0000 | ORAL_TABLET | ORAL | Status: DC
  Administered 2023-03-02: 1 via ORAL
  Filled 2023-03-02: qty 1

## 2023-03-02 NOTE — Progress Notes (Signed)
Physical Therapy Treatment Patient Details Name: Emily Hicks MRN: [id number] DOB: 1985/06/16 Today's Date: 03/02/2023   History of Present Illness 38 y.o. female who presented to [facility] ED on 1/20 with 2-week history of progressive generalized weakness, malaise, headache. Transferred to [facility] 1/20 for neurological work up. MRI reveals Multifocal regions of predominantly cortical diffusion restriction with associated contrast enhancement, involving the bilateral insular regions, right occipital lobe, and left cerebellum. EEG suggestive cortical dysfunction arising from right parieto-occipital likely secondary to underlying structural abnormality. 1/21 underwent LP. Multiple tests pending  PMH: multiple sclerosis, lupus, GERD, cervical intraepithelial neoplasia, cholelithiasis.    PT Comments  Patient tolerated incr ambulation distance and up/down stairs with rails and CGA. Mother present. Reports she has felt safe ambulating pt to/from bathroom. Pt reports she did some LE exercises on her own through the weekend. Continues to progress toward goals.     If plan is discharge home, recommend the following: A little help with walking and/or transfers;Assistance with cooking/housework;Direct supervision/assist for medications management;Direct supervision/assist for financial management;Supervision due to cognitive status   Can travel by private vehicle        Equipment Recommendations  None recommended by PT    Recommendations for Other Services       Precautions / Restrictions Precautions Precautions: Fall Precaution Comments: left hemianopsia Restrictions Weight Bearing Restrictions Per Provider Order: No     Mobility  Bed Mobility Overal bed mobility: Needs Assistance Bed Mobility: Rolling, Sidelying to Sit Rolling: Modified independent (Device/Increase time) Sidelying to sit: Contact guard assist       General bed mobility comments: From flat bed with no rails,  slow  processing, difficulty sequencing without use of rail.    Transfers Overall transfer level: Needs assistance Equipment used: Rollator (4 wheels), Rolling walker (2 wheels) Transfers: Sit to/from Stand Sit to Stand: Contact guard assist           General transfer comment: bed at lowest height and recliner with armrests; vc for hand placement initial transfer and pt then carried over x 5reps x 2 sets    Ambulation/Gait Ambulation/Gait assistance: Contact guard assist Gait Distance (Feet): 200 Feet Assistive device: Rollator (4 wheels) Gait Pattern/deviations: Step-through pattern, Decreased stride length, Drifts right/left   Gait velocity interpretation: 1.31 - 2.62 ft/sec, indicative of limited community ambulator   General Gait Details: better environmental awareness with pt avoiding objects on her rt and left.   Stairs Stairs: Yes Stairs assistance: Contact guard assist Stair Management: Two rails, Step to pattern, Forwards Number of Stairs: 2 General stair comments: cues for sequencing with stronger vs weaker leg   Wheelchair Mobility     Tilt Bed    Modified Rankin (Stroke Patients Only)       Balance Overall balance assessment: Needs assistance Sitting-balance support: No upper extremity supported, Feet supported Sitting balance-Leahy Scale: Good     Standing balance support: Single extremity supported, During functional activity, Bilateral upper extremity supported, Reliant on assistive device for balance Standing balance-Leahy Scale: Poor Standing balance comment: relying on RW for stability due to fatigue/for safety                            Cognition Arousal: Alert Behavior During Therapy: WFL for tasks assessed/performed, Flat affect Overall Cognitive Status: History of cognitive impairments - at baseline Area of Impairment: Attention, Safety/judgement, Awareness, Problem solving, Following commands  Current  Attention Level: Sustained Memory: Decreased short-term memory Following Commands: Follows one step commands consistently, Follows multi-step commands inconsistently, Follows one step commands with increased time Safety/Judgement: Decreased awareness of safety Awareness: Intellectual Problem Solving: Slow processing, Decreased initiation, Requires verbal cues General Comments: Slow processing, increased time to initiate, pt minimally verbal unless prompted and often needs reminders for safety or repetition of cues (for example cues for where to push from when standing). flat affect noted throughout.        Exercises General Exercises - Lower Extremity Toe Raises: AROM, Both, 10 reps Heel Raises: AROM, Both, 10 reps Other Exercises Other Exercises: sit to stand, 5x2 sets    General Comments General comments (skin integrity, edema, etc.): Mother present      Pertinent Vitals/Pain      Home Living                          Prior Function            PT Goals (current goals can now be found in the care plan section) Acute Rehab PT Goals Patient Stated Goal: none stated Time For Goal Achievement: 03/12/23 Potential to Achieve Goals: Good Progress towards PT goals: Progressing toward goals    Frequency    Min 3X/week      PT Plan      Co-evaluation              AM-PAC PT "6 Clicks" Mobility   Outcome Measure  Help needed turning from your back to your side while in a flat bed without using bedrails?: None Help needed moving from lying on your back to sitting on the side of a flat bed without using bedrails?: A Little Help needed moving to and from a bed to a chair (including a wheelchair)?: A Little Help needed standing up from a chair using your arms (e.g., wheelchair or bedside chair)?: A Little Help needed to walk in hospital room?: A Little Help needed climbing 3-5 steps with a railing? : A Little 6 Click Score: 19    End of Session Equipment  Utilized During Treatment: Gait belt Activity Tolerance: Patient tolerated treatment well Patient left: in chair;with call bell/phone within reach;with family/visitor present Nurse Communication: Mobility status PT Visit Diagnosis: Unsteadiness on feet (R26.81);Other abnormalities of gait and mobility (R26.89);Muscle weakness (generalized) (M62.81)     Time: 1478-2956 PT Time Calculation (min) (ACUTE ONLY): 39 min  Charges:    $Gait Training: 23-37 mins $Therapeutic Exercise: 8-22 mins PT General Charges $$ ACUTE PT VISIT: 1 Visit                      Jerolyn Center, PT Acute Rehabilitation Services  Office [phone]    Zena Amos 03/02/2023, 10:13 AM

## 2023-03-02 NOTE — Progress Notes (Signed)
Occupational Therapy Treatment Patient Details Name: DECKLYN HORNIK MRN: [id number] DOB: [birth year] Today's Date: 03/02/2023   History of present illness 38 y.o. female who presented to [facility] ED on 1/20 with 2-week history of progressive generalized weakness, malaise, headache. Transferred to [facility] 1/20 for neurological work up. MRI reveals Multifocal regions of predominantly cortical diffusion restriction with associated contrast enhancement, involving the bilateral insular regions, right occipital lobe, and left cerebellum. EEG suggestive cortical dysfunction arising from right parieto-occipital likely secondary to underlying structural abnormality. 1/21 underwent LP. Multiple tests pending  PMH: multiple sclerosis, lupus, GERD, cervical intraepithelial neoplasia, cholelithiasis.   OT comments  Pt is making great progress towards their acute OT goals. Overall pt's cognition, vision and activity tolerance have improved greatly since evaluation. She tolerated ~10 minutes of OOB activity without a sitting rest break, notable BLE shakiness with increased fatigue however. Pt's L visual field deficit is also improving but still limited peripherally. Pt verbalized and demonstrated great use of L scanning strategies. OT to continue to follow acutely to facilitate progress towards established goals. Pt will continue to benefit from OP OT.         If plan is discharge home, recommend the following:  A little help with walking and/or transfers;A little help with bathing/dressing/bathroom;Assistance with cooking/housework;Direct supervision/assist for medications management;Direct supervision/assist for financial management;Assist for transportation;Help with stairs or ramp for entrance   Equipment Recommendations  None recommended by OT       Precautions / Restrictions Precautions Precautions: Fall Restrictions Weight Bearing Restrictions Per Provider Order: No       Mobility Bed  Mobility               General bed mobility comments: OOB upon arrival    Transfers Overall transfer level: Needs assistance Equipment used: Rollator (4 wheels), Rolling walker (2 wheels) Transfers: Sit to/from Stand Sit to Stand: Contact guard assist                 Balance Overall balance assessment: Needs assistance Sitting-balance support: No upper extremity supported, Feet supported Sitting balance-Leahy Scale: Good     Standing balance support: Single extremity supported, During functional activity Standing balance-Leahy Scale: Fair                             ADL either performed or assessed with clinical judgement   ADL Overall ADL's : Needs assistance/impaired     Grooming: Supervision/safety;Standing                   Toilet Transfer: Contact guard assist;Rolling walker (2 wheels);Ambulation Toilet Transfer Details (indicate cue type and reason): simulated         Functional mobility during ADLs: Contact guard assist;Rolling walker (2 wheels) General ADL Comments: pt with much imporved activity tolernace this date. Pt also reports she has been completing ADLs in her room wtih set up - min A with her mom assisting only if needed    Extremity/Trunk Assessment Upper Extremity Assessment Upper Extremity Assessment: LUE deficits/detail LUE Deficits / Details: globally weak but able to go through full ROM, mild paraesthesias throughout (pt reports chronic), slowed/deliberate FM coordination and dexterity. Pt used functionally throughout session. LUE is likely at her baseline. LUE Sensation: decreased light touch;decreased proprioception LUE Coordination: decreased fine motor   Lower Extremity Assessment Lower Extremity Assessment: Defer to PT evaluation        Vision   Vision Assessment?: Yes  Additional Comments: Vision has improved, conitnues to be limited in L peripheral   Perception Perception Perception: Within Functional  Limits   Praxis Praxis Praxis: WFL    Cognition Arousal: Alert Behavior During Therapy: WFL for tasks assessed/performed, Flat affect Overall Cognitive Status: History of cognitive impairments - at baseline               General Comments: Improved affect and cognition this date. Pt with great recall of visual scanning techniques and need to use compensatory techniques for L inattention              General Comments VSS, mom and son present    Pertinent Vitals/ Pain       Pain Assessment Pain Assessment: No/denies pain   Frequency  Min 1X/week        Progress Toward Goals  OT Goals(current goals can now be found in the care plan section)  Progress towards OT goals: Progressing toward goals  Acute Rehab OT Goals Patient Stated Goal: to wash up OT Goal Formulation: With patient Time For Goal Achievement: 03/12/23 Potential to Achieve Goals: Good ADL Goals Pt Will Perform Grooming: Independently;standing Pt Will Perform Lower Body Dressing: with modified independence;sit to/from stand Pt Will Transfer to Toilet: with modified independence Additional ADL Goal #1: Pt will indep utilize scanning strategies to attend to L environment during functional tasks Additional ADL Goal #2: Pt will tolerate at least 10 minutes of OOB activity as a precursor to improved activity tolerance for ADLs   AM-PAC OT "6 Clicks" Daily Activity     Outcome Measure   Help from another person eating meals?: None Help from another person taking care of personal grooming?: A Little Help from another person toileting, which includes using toliet, bedpan, or urinal?: A Little Help from another person bathing (including washing, rinsing, drying)?: A Little Help from another person to put on and taking off regular upper body clothing?: A Little Help from another person to put on and taking off regular lower body clothing?: A Little 6 Click Score: 19    End of Session Equipment Utilized  During Treatment: Rolling walker (2 wheels)  OT Visit Diagnosis: Unsteadiness on feet (R26.81);Other abnormalities of gait and mobility (R26.89);Muscle weakness (generalized) (M62.81);Low vision, both eyes (H54.2)   Activity Tolerance Patient tolerated treatment well   Patient Left in chair;with call bell/phone within reach;with family/visitor present   Nurse Communication Mobility status        Time: 1610-9604 OT Time Calculation (min): 18 min  Charges: OT General Charges $OT Visit: 1 Visit OT Treatments $Self Care/Home Management : 8-22 mins  Derenda Mis, OTR/L Acute Rehabilitation Services Office [phone] Secure Chat Communication Preferred   Donia Pounds 03/02/2023, 2:10 PM

## 2023-03-02 NOTE — TOC Progression Note (Signed)
Transition of Care [facility]) - Progression Note    Patient Details  Name: Emily Hicks MRN: [id number] Date of Birth: 28-Jun-1985  Transition of Care [facility]) CM/SW Contact  Kermit Balo, RN Phone Number: 03/02/2023, 3:27 PM  Clinical Narrative:     New pt appointment at [facility] [place] pushed back per pt request. New appointment on the AVS.  Outpatient therapy arranged with [facility]. Information on the AVS. Pt will call to schedule the first appointment. TOC following.  Expected Discharge Plan: OP Rehab Barriers to Discharge: Continued Medical Work up  Expected Discharge Plan and Services   Discharge Planning Services: CM Consult   Living arrangements for the past 2 months: Single Family Home                                       Social Determinants of Health (SDOH) Interventions SDOH Screenings   Food Insecurity: No Food Insecurity (02/23/2023)  Housing: Low Risk  (02/23/2023)  Transportation Needs: No Transportation Needs (02/23/2023)  Utilities: Not At Risk (02/23/2023)  Depression (PHQ2-9): Low Risk  (08/15/2021)  Tobacco Use: Low Risk  (02/23/2023)    Readmission Risk Interventions     No data to display

## 2023-03-02 NOTE — Plan of Care (Signed)
Problem: Education: Goal: Knowledge of General Education information will improve Description Including pain rating scale, medication(s)/side effects and non-pharmacologic comfort measures Outcome: Progressing   Problem: Health Behavior/Discharge Planning: Goal: Ability to manage health-related needs will improve Outcome: Progressing

## 2023-03-02 NOTE — Progress Notes (Signed)
Progress Note   Patient: Emily Hicks ZOX:[id number] DOB: Jul 07, 1985 DOA: 02/23/2023     7 DOS: the patient was seen and examined on 03/02/2023   Brief hospital course: 38 y.o. female with past medical history significant for multiple sclerosis, lupus, GERD, cervical intraepithelial neoplasia, cholelithiasis who presented to [facility] ED on 1/28 with 2-week history of progressive generalized weakness, malaise, headache.  Also endorsed nasal congestion, nausea, nosebleeds.    Currently on Plaquenil and azathioprine for her lupus. Followed by neurology outpatient, Dr. Epimenio Foot but currently not on any additional disease modifying therapy for her MS currently.  Sister also endorses that she has been having a hard time "gripping" items.  She has been taking Excedrin without much relief regarding her headaches.  Denies any urinary symptoms, no visual changes, no fever, no chills, no rigors, no muscle aches.   In the ED, temperature 97.9 F, HR 107, RR 18, BP 175/119, SpO2 99% on room air.  WBC 5.1, hemoglobin 10.0, platelet count 287.  Sodium 142, potassium 3.8, chloride 112, CO2 22, glucose 90, BUN 12, creatinine 0.92.  Hemoglobin A1c 5.5.  hCG negative.  CT head without contrast with multifocal cerebral hypodensities including 5 cm edematous area right parietal lobe, indeterminate with considerations including subacute ischemic, neoplasm, infectious/inflammatory process, tumefactive demyelination.  Neurology was consulted and recommended transfer to [facility].  TRH was consulted for admission and patient was transferred to [facility].  Assessment and Plan: Brain lesions  Patient presenting with headache, progressive generalized weakness over the last few weeks associated with malaise.  Currently immunosuppressed from her lupus on azathioprine and Plaquenil.  CT head without contrast on admission with multifocal cerebral hypodensities including 5 cm edematous area right parietal lobe.  MRI  brain with and without contrast with multifocal regions of cortical diffusion restriction associated with contrast-enhancement bilateral insular regions, right occipital lobe, left cerebellum.  Imaging findings concerning for inflammatory vs CNS vasculitis vs CVA vs lupus cerebritis vs tumefactive MS vs infection.  CT angiogram head/neck with no large vessel occlusion, no stenosis or other acute vascular abnormality, no evidence for vasculitis, multiple prominent cervical, axillary, mediastinal lymph nodes, few scattered pulmonary nodules.  MR T/L-spine with T2 hyperintense foci cervical spine cord C4-5, C6/7 consistent with known MS, no abnormal enhancement to suggest active demyelination, no abnormal spinal cord signal or enhancement in the thoracic spine, epidural fluid collection thoracic spine likely from recent LP.  Underwent lumbar puncture 02/24/2023 by IR with initial fluid analysis not consistent with infection; meningitis/encephalitis panel negative.  EEG with generalized and maximal right parieto-occipital slowing suggestive of cortical dysfunction likely secondary to underlying structural normality, no seizure/epileptiform discharges noted. -- Neurology and infectious disease are following -- Toxoplasma Ab elevated> IgM 22.2, IgG 81.2; per ID not concerning for active infection given appearance of brain lesions on MRI -- RPR nonreactive -- CSF toxoplasma PCR: Pending -- CSF cytology: No malignant cells -- CSF oligoclonal bands: Pending -- CSF IgG: Pending -- CSF culture no organism on Gram stain; no growth -- CSF fungal culture: Neg -- CSF JC virus DNA PCR: pos --CSF MTB -RIF NAA pending -- HIV antibody: NR -- anti-MOG testing: neg -- EBV pending --CMV serologies were pos. Per ID, difficult to destinguish if a true pos --Bartonella antibody pending,  --CK unremarkable -, aldolase pending --, LDH normal -- ESR 25, , CRP 0.6 --, C3/4 complement: Pending  - CT chest/abdomen/pelvis with  contrast reviewed with scatterred tiny pulm nodules up to 5mm  and shotty B axillary lymph nodes -Pt now s/p axillary node biopsy, pending -Neurology recs for mirtazapine for sleep/anxiety/appetite 7.5mg  at bedtime Rec for Prednisone 60mg  every day starting 1/29 cont through outpt f/u with rheum/neurology -consider PJP prophylaxis to be determined by outpatient providers if she remains on higher dose of steroids >1-2 mos -Stable at this time   Pulmonary nodules Scattered pulmonary nodules noted incidentally on CT angiogram head/neck. --axillary nodes on CT noted. _ -Pt now s/p axillary node biopsy, pending results   Lymphadenopathy CT angiogram head/neck with findings of multiple prominent cervical, axillary, mediastinal lymph nodes. -- IR was consulted for node biopsy   Hypokalemia Hypomagnesemia -within normal limits   History of multiple sclerosis, lupus Follows with neurology outpatient, Dr. Epimenio Foot -- Continue Plaquenil 200mg  PO BID, azathioprine    Essential hypertension -- Metoprolol succinate 25 mg p.o. daily -- Hydralazine 10 mg p.o. q6h PRN SBP >165   Vitamin D deficiency Vitamin D 25-hydroxy level 24.72.  Currently not taking ergocalciferol as reported at home. -- Cholecalciferol 1000 units p.o. daily   GERD -- Protonix 40 mg p.o. daily   Obesity, class III Body mass index is 36.84 kg/m.  Complicates all facets of care   Subjective: Without complaints at this time  Physical Exam: Vitals:   03/02/23 0455 03/02/23 0751 03/02/23 1133 03/02/23 1542  BP: (!) 133/99 (!) 128/95 (!) 131/95 (!) 134/97  Pulse: 78 77 74 77  Resp: 17 16 16 16   Temp: 97.8 F (36.6 C) 97.9 F (36.6 C) (!) 97.5 F (36.4 C) 98.6 F (37 C)  TempSrc:  Oral Oral Oral  SpO2: 100%  100% 100%  Weight:      Height:       General exam: Conversant, in no acute distress Respiratory system: normal chest rise, clear, no audible wheezing Cardiovascular system: regular rhythm,  s1-s2 Gastrointestinal system: Nondistended, nontender, pos BS Central nervous system: No seizures, no tremors Extremities: No cyanosis, no joint deformities Skin: No rashes, no pallor Psychiatry: Affect normal // no auditory hallucinations   Data Reviewed:  Labs reviewed: Na 143, K 4.5, Cr 1.21, hgb 9.1  Family Communication: Pt in room, family at bedside  Disposition: Status is: Inpatient Remains inpatient appropriate because: Severity of illness  Planned Discharge Destination: Home    Author: Rickey Barbara, MD 03/02/2023 4:56 PM  For on call review [email or web address].

## 2023-03-03 ENCOUNTER — Other Ambulatory Visit (HOSPITAL_COMMUNITY): Payer: Self-pay

## 2023-03-03 DIAGNOSIS — R519 Headache, unspecified: Secondary | ICD-10-CM | POA: Diagnosis not present

## 2023-03-03 DIAGNOSIS — N179 Acute kidney failure, unspecified: Secondary | ICD-10-CM | POA: Diagnosis not present

## 2023-03-03 DIAGNOSIS — G35 Multiple sclerosis: Secondary | ICD-10-CM | POA: Diagnosis not present

## 2023-03-03 DIAGNOSIS — G939 Disorder of brain, unspecified: Secondary | ICD-10-CM | POA: Diagnosis not present

## 2023-03-03 LAB — COMPREHENSIVE METABOLIC PANEL
ALT: 7 U/L (ref 0–44)
AST: 10 U/L — ABNORMAL LOW (ref 15–41)
Albumin: 2.3 g/dL — ABNORMAL LOW (ref 3.5–5.0)
Alkaline Phosphatase: 33 U/L — ABNORMAL LOW (ref 38–126)
Anion gap: 8 (ref 5–15)
BUN: 41 mg/dL — ABNORMAL HIGH (ref 6–20)
CO2: 19 mmol/L — ABNORMAL LOW (ref 22–32)
Calcium: 7.7 mg/dL — ABNORMAL LOW (ref 8.9–10.3)
Chloride: 113 mmol/L — ABNORMAL HIGH (ref 98–111)
Creatinine, Ser: 1.23 mg/dL — ABNORMAL HIGH (ref 0.44–1.00)
GFR, Estimated: 58 mL/min — ABNORMAL LOW (ref >60)
Glucose, Bld: 119 mg/dL — ABNORMAL HIGH (ref 70–99)
Potassium: 4.3 mmol/L (ref 3.5–5.1)
Sodium: 140 mmol/L (ref 135–145)
Total Bilirubin: 0.4 mg/dL (ref 0.0–1.2)
Total Protein: 5.6 g/dL — ABNORMAL LOW (ref 6.5–8.1)

## 2023-03-03 LAB — CULTURE, BLOOD (ROUTINE X 2)
Culture: NO GROWTH
Culture: NO GROWTH

## 2023-03-03 LAB — SURGICAL PATHOLOGY

## 2023-03-03 LAB — GLUCOSE, CAPILLARY
Glucose-Capillary: 108 mg/dL — ABNORMAL HIGH (ref 70–99)
Glucose-Capillary: 110 mg/dL — ABNORMAL HIGH (ref 70–99)

## 2023-03-03 LAB — CBC
HCT: 30 % — ABNORMAL LOW (ref 36.0–46.0)
Hemoglobin: 9.2 g/dL — ABNORMAL LOW (ref 12.0–15.0)
MCH: 27.1 pg (ref 26.0–34.0)
MCHC: 30.7 g/dL (ref 30.0–36.0)
MCV: 88.5 fL (ref 80.0–100.0)
Platelets: 248 10*3/uL (ref 150–400)
RBC: 3.39 MIL/uL — ABNORMAL LOW (ref 3.87–5.11)
RDW: 14 % (ref 11.5–15.5)
WBC: 5.6 10*3/uL (ref 4.0–10.5)
nRBC: 0.7 % — ABNORMAL HIGH (ref 0.0–0.2)

## 2023-03-03 MED ORDER — METOPROLOL SUCCINATE ER 50 MG PO TB24
50.0000 mg | ORAL_TABLET | Freq: Every day | ORAL | 0 refills | Status: AC
  Filled 2023-03-03: qty 30, 30d supply, fill #0

## 2023-03-03 MED ORDER — SULFAMETHOXAZOLE-TRIMETHOPRIM 800-160 MG PO TABS
1.0000 | ORAL_TABLET | ORAL | 0 refills | Status: DC
  Filled 2023-03-03: qty 12, 28d supply, fill #0

## 2023-03-03 MED ORDER — PANTOPRAZOLE SODIUM 40 MG PO TBEC
40.0000 mg | DELAYED_RELEASE_TABLET | Freq: Every day | ORAL | 0 refills | Status: DC
  Filled 2023-03-03: qty 30, 30d supply, fill #0

## 2023-03-03 MED ORDER — MIRTAZAPINE 15 MG PO TBDP
7.5000 mg | ORAL_TABLET | Freq: Every evening | ORAL | 0 refills | Status: DC | PRN
  Filled 2023-03-03: qty 15, 30d supply, fill #0

## 2023-03-03 MED ORDER — PREDNISONE 20 MG PO TABS
60.0000 mg | ORAL_TABLET | Freq: Every day | ORAL | 0 refills | Status: AC
  Filled 2023-03-03: qty 90, 30d supply, fill #0

## 2023-03-03 NOTE — Progress Notes (Signed)
Reviewed AVS, patient expressed understanding of medications, MD follow up reviewed.  Removed IV, Site clean, dry and intact.  Picked up medications from TOC pharm, Pt transported to Discharge lounge .

## 2023-03-03 NOTE — TOC Transition Note (Signed)
Transition of Care [facility]) - Discharge Note   Patient Details  Name: Emily Hicks MRN: [id number] Date of Birth: 03-Feb-1986  Transition of Care [facility]) CM/SW Contact:  Kermit Balo, RN Phone Number: 03/03/2023, 3:06 PM   Clinical Narrative:     Pt is discharging home with outpatient therapy through [facility]. Information on the AVS. Pt will call to schedule the first appointment. Pt has transportation home.  Final next level of care: OP Rehab Barriers to Discharge: No Barriers Identified   Patient Goals and CMS Choice     Choice offered to / list presented to : Patient      Discharge Placement                       Discharge Plan and Services Additional resources added to the After Visit Summary for     Discharge Planning Services: CM Consult                                 Social Drivers of Health (SDOH) Interventions SDOH Screenings   Food Insecurity: No Food Insecurity (02/23/2023)  Housing: Low Risk  (02/23/2023)  Transportation Needs: No Transportation Needs (02/23/2023)  Utilities: Not At Risk (02/23/2023)  Depression (PHQ2-9): Low Risk  (08/15/2021)  Tobacco Use: Low Risk  (02/23/2023)     Readmission Risk Interventions     No data to display

## 2023-03-03 NOTE — Progress Notes (Signed)
[facility]  Date of Admission:  02/23/2023      Total days of antibiotics 0         ASSESSMENT: Emily Hicks is a 38 y.o. female with h/o SLE, MS with:   Headaches -  Brain Lesion with R>>L Shift - She has improved on empiric steroids and feels ready for discharge. Reviewed again of extensive work up from ID and non-ID causes. She has not had anything return positive that would indicate direct evidence of infection. She does have some reactive antibiotics that are hard to place regarding clinical significance without tissue sampling (ie: CMV, Toxo, etc). Given she is improving on steroids without any features of infection, would agree with discharge to care with community team and MS specialist for ongoing follow up.   Prophylaxis PJP -  Will start empirically on bactrim 1 DS tab 3x weekly given high dose steroids with indeterminate duration.   No need for ID follow up in community.     PLAN: Bactrim 1 DS tab M/W/F for duration of steroids  Principal Problem:   Multiple sclerosis (HCC) Active Problems:   Common migraine   Normocytic anemia   SLE (systemic lupus erythematosus related syndrome) (HCC)   Essential hypertension    azaTHIOprine  150 mg Oral Daily   calcium-vitamin D  1 tablet Oral Q breakfast   cholecalciferol  1,000 Units Oral Daily   enoxaparin (LOVENOX) injection  40 mg Subcutaneous Q24H   hydroxychloroquine  200 mg Oral BID   insulin aspart  0-9 Units Subcutaneous TID WC   ketoconazole  1 Application Topical Weekly   metoprolol succinate  50 mg Oral QHS   pantoprazole  40 mg Oral Daily   [START ON 03/04/2023] predniSONE  60 mg Oral Q breakfast   sodium chloride flush  3 mL Intravenous Q12H   sulfamethoxazole-trimethoprim  1 tablet Oral Once per day on Monday Wednesday Friday    SUBJECTIVE: Feels better. Feels ready to transition home.  Mom joins [place] at the bedside and thankful   Lymph node biopsy negative for  malignancy on pathology.    Review of Systems: ROS   No Known Allergies  OBJECTIVE: Vitals:   03/03/23 0341 03/03/23 0602 03/03/23 0727 03/03/23 1121  BP: (!) 148/112  (!) 137/99 (!) 145/105  Pulse: 68  78 76  Resp: 18  17 17   Temp: 97.8 F (36.6 C)  97.9 F (36.6 C) 97.6 F (36.4 C)  TempSrc: Oral  Oral Oral  SpO2: 100%  99% 100%  Weight:  87.8 kg    Height:       Body mass index is 36.58 kg/m.  Physical Exam Constitutional:      Appearance: Normal appearance. She is not ill-appearing.  HENT:     Mouth/Throat:     Mouth: Mucous membranes are moist.     Pharynx: Oropharynx is clear.  Eyes:     General: No scleral icterus. Cardiovascular:     Rate and Rhythm: Normal rate and regular rhythm.  Pulmonary:     Effort: Pulmonary effort is normal.  Musculoskeletal:     Cervical back: Normal range of motion. No rigidity.  Neurological:     Mental Status: She is oriented to person, place, and time.  Psychiatric:        Mood and Affect: Mood normal.        Thought Content: Thought content normal.     Lab Results  Lab Results  Component Value Date   WBC 5.6 03/03/2023   HGB 9.2 (L) 03/03/2023   HCT 30.0 (L) 03/03/2023   MCV 88.5 03/03/2023   PLT 248 03/03/2023    Lab Results  Component Value Date   CREATININE 1.23 (H) 03/03/2023   BUN 41 (H) 03/03/2023   NA 140 03/03/2023   K 4.3 03/03/2023   CL 113 (H) 03/03/2023   CO2 19 (L) 03/03/2023    Lab Results  Component Value Date   ALT 7 03/03/2023   AST 10 (L) 03/03/2023   ALKPHOS 33 (L) 03/03/2023   BILITOT 0.4 03/03/2023     Microbiology: Recent Results (from the past 240 hours)  CSF culture w Gram Stain     Status: None   Collection Time: 02/24/23 12:08 PM   Specimen: PATH Cytology CSF; Cerebrospinal Fluid  Result Value Ref Range Status   Specimen Description CSF  Final   Special Requests NONE  Final   Gram Stain   Final    WBC PRESENT, PREDOMINANTLY MONONUCLEAR NO ORGANISMS SEEN CYTOSPIN  SMEAR    Culture   Final    NO GROWTH 3 DAYS Performed at [facility], [address], [place], [place] [address]    Report Status 02/27/2023 FINAL  Final  Fungus Culture With Stain     Status: None (Preliminary result)   Collection Time: 02/24/23 12:08 PM   Specimen: PATH Cytology CSF; Cerebrospinal Fluid  Result Value Ref Range Status   Fungus Stain Final report  Final    Comment: (NOTE) Performed At: [facility] [address] [place], [place] [id number] Jolene Schimke MD MW:[id number]    Fungus (Mycology) Culture PENDING  Incomplete   Fungal Source CSF  Final    Comment: Performed at [facility], [address] [place], [place] [address]  Culture, Fungus without Smear     Status: None (Preliminary result)   Collection Time: 02/24/23 12:08 PM   Specimen: PATH Cytology CSF; Cerebrospinal Fluid  Result Value Ref Range Status   Specimen Description CSF  Final   Special Requests NONE  Final   Culture   Final    NO FUNGUS ISOLATED AFTER 7 DAYS Performed at [facility], [address], [place], [place] [address]    Report Status PENDING  Incomplete  MT-RIF NAA W Cult, Non-Sputum     Status: None (Preliminary result)   Collection Time: 02/24/23 12:08 PM   Specimen: PATH Cytology CSF; Cerebrospinal Fluid  Result Value Ref Range Status   Source CSF  Final    Comment: Performed at [facility], [address], [place], [place] [address]   Specimen Source PENDING  Incomplete   M tuberculosis complex PENDING  Incomplete   Rifampin PENDING  Incomplete   AFB Specimen Processing Direct Inoculation  Final    Comment: (NOTE) Performed At: [facility] [address] [place], [place] [id number] Jolene Schimke MD PP:[id number]    Acid Fast Culture PENDING  Incomplete  Anaerobic culture w Gram Stain     Status: None   Collection Time: 02/24/23 12:08 PM   Specimen: PATH Cytology CSF; Cerebrospinal Fluid  Result Value Ref Range Status    Specimen Description CSF  Final   Special Requests NONE  Final   Culture   Final    NO ANAEROBES ISOLATED Performed at [facility], [address] [place], [place] [address]    Report Status 03/01/2023 FINAL  Final  Fungus Culture Result     Status: None   Collection Time: 02/24/23 12:08 PM  Result Value Ref Range Status   Result 1 Comment  Final    Comment: (NOTE) KOH/Calcofluor preparation:  no fungus observed. Performed At: [facility] [address] [place], [place] [id number] Jolene Schimke MD NW:[id number]   Culture, blood (Routine X 2) w Reflex to ID Panel     Status: None   Collection Time: 02/26/23  6:47 AM   Specimen: BLOOD  Result Value Ref Range Status   Specimen Description BLOOD BLOOD RIGHT HAND  Final   Special Requests   Final    BOTTLES DRAWN AEROBIC AND ANAEROBIC Blood Culture results may not be optimal due to an inadequate volume of blood received in culture bottles   Culture   Final    NO GROWTH 5 DAYS Performed at [facility], [address] [place], [place] [address]    Report Status 03/03/2023 FINAL  Final  Culture, blood (Routine X 2) w Reflex to ID Panel     Status: None   Collection Time: 02/26/23  6:52 AM   Specimen: BLOOD RIGHT ARM  Result Value Ref Range Status   Specimen Description BLOOD RIGHT ARM  Final   Special Requests   Final    BOTTLES DRAWN AEROBIC AND ANAEROBIC Blood Culture results may not be optimal due to an inadequate volume of blood received in culture bottles   Culture   Final    NO GROWTH 5 DAYS Performed at [facility], [address], [place], [place] [address]    Report Status 03/03/2023 FINAL  Final    Rexene Alberts, MSN, NP-C [facility]  [email or web address] Pager: [phone] Office: [phone] [facility] Main Line: [phone] *Secure Chat Communication Welcome

## 2023-03-03 NOTE — Discharge Summary (Signed)
Physician Discharge Summary   Patient: Emily Hicks MRN: [id number] DOB: [birth year]  Admit date:     02/23/2023  Discharge date: 03/03/23  Discharge Physician: Rickey Barbara   PCP: Patient, No Pcp Per   Recommendations at discharge:    Follow up with PCP in 1-2 weeks Follow up with Neurology as scheduled Follow up with Rheumatology as scheduled  Discharge Diagnoses: Principal Problem:   Multiple sclerosis (HCC) Active Problems:   SLE (systemic lupus erythematosus related syndrome) (HCC)   Normocytic anemia   Common migraine   Essential hypertension  Resolved Problems:   * No resolved hospital problems. *  Hospital Course: 38 y.o. female with past medical history significant for multiple sclerosis, lupus, GERD, cervical intraepithelial neoplasia, cholelithiasis who presented to [facility] ED on 1/28 with 2-week history of progressive generalized weakness, malaise, headache.  Also endorsed nasal congestion, nausea, nosebleeds.    Currently on Plaquenil and azathioprine for her lupus. Followed by neurology outpatient, Dr. Epimenio Foot but currently not on any additional disease modifying therapy for her MS currently.  Sister also endorses that she has been having a hard time "gripping" items.  She has been taking Excedrin without much relief regarding her headaches.  Denies any urinary symptoms, no visual changes, no fever, no chills, no rigors, no muscle aches.   In the ED, temperature 97.9 F, HR 107, RR 18, BP 175/119, SpO2 99% on room air.  WBC 5.1, hemoglobin 10.0, platelet count 287.  Sodium 142, potassium 3.8, chloride 112, CO2 22, glucose 90, BUN 12, creatinine 0.92.  Hemoglobin A1c 5.5.  hCG negative.  CT head without contrast with multifocal cerebral hypodensities including 5 cm edematous area right parietal lobe, indeterminate with considerations including subacute ischemic, neoplasm, infectious/inflammatory process, tumefactive demyelination.  Neurology was consulted and  recommended transfer to [facility].  TRH was consulted for admission and patient was transferred to [facility].    Assessment and Plan: Brain lesions  Patient presenting with headache, progressive generalized weakness over the last few weeks associated with malaise.  Currently immunosuppressed from her lupus on azathioprine and Plaquenil.  CT head without contrast on admission with multifocal cerebral hypodensities including 5 cm edematous area right parietal lobe.  MRI brain with and without contrast with multifocal regions of cortical diffusion restriction associated with contrast-enhancement bilateral insular regions, right occipital lobe, left cerebellum.  Imaging findings concerning for inflammatory vs CNS vasculitis vs CVA vs lupus cerebritis vs tumefactive MS vs infection.  CT angiogram head/neck with no large vessel occlusion, no stenosis or other acute vascular abnormality, no evidence for vasculitis, multiple prominent cervical, axillary, mediastinal lymph nodes, few scattered pulmonary nodules.  MR T/L-spine with T2 hyperintense foci cervical spine cord C4-5, C6/7 consistent with known MS, no abnormal enhancement to suggest active demyelination, no abnormal spinal cord signal or enhancement in the thoracic spine, epidural fluid collection thoracic spine likely from recent LP.  Underwent lumbar puncture 02/24/2023 by IR with initial fluid analysis not consistent with infection; meningitis/encephalitis panel negative.  EEG with generalized and maximal right parieto-occipital slowing suggestive of cortical dysfunction likely secondary to underlying structural normality, no seizure/epileptiform discharges noted. -Multiple infectious seriologies were found to be negative -Pt now s/p axillary node biopsy, findings were negative for malignancy -Neurology recs for mirtazapine for sleep/anxiety/appetite 7.5mg  at bedtime Rec for Prednisone 60mg  every day starting 1/29 cont through outpt f/u with  rheum/neurology -consider PJP prophylaxis to be determined by outpatient providers if she remains on higher dose of steroids >  1-2 mos -Pt to continue bactrim for PCP porphylaxis while on steoids   Pulmonary nodules Scattered pulmonary nodules noted incidentally on CT angiogram head/neck. --axillary nodes on CT noted. _ -Pt now s/p axillary node biopsy, results neg for malignancy   Lymphadenopathy CT angiogram head/neck with findings of multiple prominent cervical, axillary, mediastinal lymph nodes.   Hypokalemia Hypomagnesemia -within normal limits   History of multiple sclerosis, lupus Follows with neurology outpatient, Dr. Epimenio Foot -- Continue Plaquenil 200mg  PO BID, azathioprine    Essential hypertension -- Metoprolol succinate 25 mg p.o. daily -- Hydralazine 10 mg p.o. q6h PRN SBP >165   Vitamin D deficiency Vitamin D 25-hydroxy level 24.72.  Currently not taking ergocalciferol as reported at home. -- Cholecalciferol 1000 units p.o. daily   GERD -- Protonix 40 mg p.o. daily   Obesity, class III Body mass index is 36.84 kg/m.  Complicates all facets of care    Consultants: Neurology, ID, Case had been discussed with Neuro-oncologist Procedures performed: LP  Disposition: Home Diet recommendation:  Regular diet DISCHARGE MEDICATION: Allergies as of 03/03/2023   No Known Allergies      Medication List     TAKE these medications    aspirin-acetaminophen-caffeine 250-250-65 MG tablet Commonly known as: EXCEDRIN MIGRAINE Take 2 tablets by mouth every 6 (six) hours as needed for headache or migraine.   azathioprine 100 MG tablet Commonly known as: IMURAN Take 150 mg by mouth daily.   clobetasol ointment 0.05 % Commonly known as: TEMOVATE Apply 1 application  topically daily as needed (rash).   gabapentin 300 MG capsule Commonly known as: NEURONTIN Take 1 capsule (300 mg total) by mouth 3 (three) times daily. What changed:  when to take this reasons to  take this   hydroxychloroquine 200 MG tablet Commonly known as: PLAQUENIL Take 200 mg by mouth 2 (two) times daily.   ketoconazole 2 % shampoo Commonly known as: NIZORAL Apply 1 Application topically once a week.   metoprolol succinate 50 MG 24 hr tablet Commonly known as: TOPROL-XL Take 1 tablet (50 mg total) by mouth at bedtime. Take with or immediately following a meal. What changed:  medication strength how much to take additional instructions   mirtazapine 15 MG disintegrating tablet Commonly known as: REMERON SOL-TAB Take 0.5 tablets (7.5 mg total) by mouth at bedtime as needed (sleep if patient prefers over Pearson).   pantoprazole 40 MG tablet Commonly known as: PROTONIX Take 1 tablet (40 mg total) by mouth daily.   predniSONE 20 MG tablet Commonly known as: DELTASONE Take 3 tablets (60 mg total) by mouth daily with breakfast. Start taking on: March 04, 2023   sulfamethoxazole-trimethoprim 800-160 MG tablet Commonly known as: BACTRIM DS Take 1 tablet by mouth 3 (three) times a week. Start taking on: March 04, 2023   Tylenol 325 MG Caps Generic drug: Acetaminophen Take 650 mg by mouth every 6 (six) hours as needed (for headaches or pain).   Vitamin D (Ergocalciferol) 1.25 MG (50000 UNIT) Caps capsule Commonly known as: DRISDOL Take 1 capsule (50,000 Units total) by mouth every 7 (seven) days.        Follow-up Information     [facility] Follow up on 03/24/2023.   Why: Your appointment is at 2:00 pm. Please arrive early and bring insurance card, picture ID and current medications. Contact information: [address] [place], [place] [address] Phone: [phone]        [facility]. Schedule an appointment as  soon as possible for a visit.   Specialty: Rehabilitation Contact information: [address] [place] [address] [phone]        Epimenio Foot, Pearletha Furl, MD Follow up.    Specialty: Neurology Contact information: [address] [place] [address] [phone]         Follow up with your Rheumatologist. Schedule an appointment as soon as possible for a visit.   Why: Hospital follow up               Discharge Exam: Filed Weights   02/26/23 0421 03/01/23 0500 03/03/23 0602  Weight: 87.7 kg 87.8 kg 87.8 kg   General exam: Awake, laying in bed, in nad Respiratory system: Normal respiratory effort, no wheezing Cardiovascular system: regular rate, s1, s2 Gastrointestinal system: Soft, nondistended, positive BS Central nervous system: CN2-12 grossly intact, strength intact Extremities: Perfused, no clubbing Skin: Normal skin turgor, no notable skin lesions seen Psychiatry: Mood normal // no visual hallucinations   Condition at discharge: fair  The results of significant diagnostics from this hospitalization (including imaging, microbiology, ancillary and laboratory) are listed below for reference.   Imaging Studies: [place] CORE BIOPSY (LYMPH NODES) Result Date: 02/27/2023 INDICATION: Mildly prominent bilateral axillary lymph nodes. Please perform ultrasound-guided biopsy for tissue diagnostic purposes. EXAM: ULTRASOUND-GUIDED RIGHT AXILLARY LYMPH NODE BIOPSY COMPARISON:  CT the chest, abdomen and pelvis-02/25/2023 MEDICATIONS: None ANESTHESIA/SEDATION: Moderate (conscious) sedation was employed during this procedure. A total of Versed 1.5 mg and Fentanyl 75 mcg was administered intravenously. Moderate Sedation Time: 10 minutes. The patient's level of consciousness and vital signs were monitored continuously by radiology nursing throughout the procedure under my direct supervision. COMPLICATIONS: None immediate. TECHNIQUE: Informed written consent was obtained from the patient after a discussion of the risks, benefits and alternatives to treatment. Questions regarding the procedure were encouraged and answered. Initial ultrasound scanning demonstrated  1.4 x 1.3 cm right axillary lymph node (image 6), correlating with the lymph node seen on preceding chest CT image 7, series 3. An ultrasound image was saved for documentation purposes. The procedure was planned. A timeout was performed prior to the initiation of the procedure. The operative was prepped and draped in the usual sterile fashion, and a sterile drape was applied covering the operative field. A timeout was performed prior to the initiation of the procedure. Local anesthesia was provided with 1% lidocaine with epinephrine. Under direct ultrasound guidance, an 18 gauge core needle device was utilized to obtain to obtain 5 core needle biopsies of the indeterminate right axillary lymph node. The samples were placed in saline and submitted to pathology. The needle was removed and superficial hemostasis was achieved with manual compression. Post procedure scan was negative for significant hematoma. A dressing was applied. The patient tolerated the procedure well without immediate postprocedural complication. IMPRESSION: Technically successful ultrasound guided biopsy of indeterminate right axillary lymph node. Electronically Signed   By: Simonne Come M.D.   On: 02/27/2023 16:57   MR BRAIN W WO CONTRAST Result Date: 02/26/2023 CLINICAL DATA:  Initial evaluation for brain/CNS neoplasm, worsening nausea and vomiting. EXAM: MRI HEAD WITHOUT AND WITH CONTRAST TECHNIQUE: Multiplanar, multiecho pulse sequences of the brain and surrounding structures were obtained without and with intravenous contrast. CONTRAST:  7mL GADAVIST GADOBUTROL 1 MMOL/ML IV SOLN COMPARISON:  Prior MRI from 02/23/2023 as well as additional exams. FINDINGS: Brain: Again seen are multifocal areas of scattered T2/FLAIR signal abnormality involving the cortical and subcortical aspects of the bilateral cerebral and cerebellar hemispheres.  Overall number and distribution of these lesions is not significantly changed from prior. However, the size  and degree of associated FLAIR signal abnormality appears somewhat progressed and worsened. For example, the dominant lesion at the right occipital lobe now measures 5.0 x 3.4 x 5.2 cm, previously 4.8 x 3.4 x 4.3 cm on 02/23/2023. The additional lesions are also slightly more prominent and slightly increased in size, with particular note made of increased prominence of a lesion involving the central superior cerebellar vermis (series 11, image 19). Increased mass effect by the right occipital lesion on the posterior aspect of the right lateral ventricle since prior (series 11, image 29). Trace right-to-left shift is now seen at the septum pellucidum, new from prior. Associated petechial blood products about several of these lesions is similar. Irregular heterogeneous postcontrast enhancement about these lesions is similar to perhaps slightly more prominent as well. No new lesions identified. The remainder of the brain is otherwise stable in appearance. No other acute or subacute infarct. Underlying mild nonspecific cerebral white matter disease noted, stable. No other abnormal enhancement. Vascular: Major intracranial vascular flow voids are maintained. Skull and upper cervical spine: Craniocervical junction within normal limits. Decreased T1 signal intensity within the visualized bone marrow, nonspecific, but most commonly related to anemia, smoking or obesity. No scalp soft tissue abnormality. Sinuses/Orbits: Globes orbital soft tissues within normal limits. Left sphenoid sinus retention cyst, with scattered mucosal thickening about the ethmoidal air cells and maxillary sinuses. No significant mastoid effusion. Other: None. IMPRESSION: 1. Multifocal areas of T2/FLAIR signal abnormality involving the cortical and subcortical aspects of the bilateral cerebral and cerebellar hemispheres, stable in number and distribution from prior, but overall slightly more prominent with increased size and progressive T2/FLAIR  signal abnormality. Again, differential considerations are broad, and described on previous exams. 2. Increased mass effect by the dominant right occipital lesion on the posterior aspect of the right lateral ventricle, with new trace right-to-left shift at the septum pellucidum. Electronically Signed   By: Rise Mu M.D.   On: 02/26/2023 21:03   CT CHEST ABDOMEN PELVIS W CONTRAST Result Date: 02/25/2023 CLINICAL DATA:  Immunosuppression on azathioprine, metastatic disease of unknown primary, brain metastases EXAM: CT CHEST, ABDOMEN, AND PELVIS WITH CONTRAST TECHNIQUE: Multidetector CT imaging of the chest, abdomen and pelvis was performed following the standard protocol during bolus administration of intravenous contrast. RADIATION DOSE REDUCTION: This exam was performed according to the departmental dose-optimization program which includes automated exposure control, adjustment of the mA and/or kV according to patient size and/or use of iterative reconstruction technique. CONTRAST:  75mL OMNIPAQUE IOHEXOL 350 MG/ML SOLN COMPARISON:  None Available. FINDINGS: CT CHEST FINDINGS Cardiovascular: No significant vascular findings. Normal heart size. No pericardial effusion. Mediastinum/Nodes: There is shotty bilateral axillary adenopathy with the index lymph node measuring up to 13 mm in short axis diameter within the left axilla best seen on axial image # 10/3. No frankly pathologic thoracic adenopathy, however, is identified. Visualized thyroid is unremarkable. Esophagus is unremarkable. Small hiatal hernia. Lungs/Pleura: Small bilateral pleural effusions with associated bibasilar atelectasis. Scattered tiny pulmonary nodules are seen randomly distributed throughout the lungs bilaterally measuring up to 5 mm within the superior segment of the left lower lobe at axial image # 45/5, nonspecific. These may reflect the sequela of remote infection or inflammation though pulmonary metastatic disease is not  excluded. No pneumothorax. No central obstructing lesion. Musculoskeletal: No acute bone abnormality. No lytic or blastic bone lesion. CT ABDOMEN PELVIS FINDINGS Hepatobiliary: Moderate hepatic  steatosis. No enhancing intrahepatic mass. Status post cholecystectomy. No intra or extrahepatic biliary ductal dilation peer Pancreas: Unremarkable Spleen: Unremarkable Adrenals/Urinary Tract: Adrenal glands are unremarkable. Kidneys are normal, without renal calculi, focal lesion, or hydronephrosis. Bladder is unremarkable. Stomach/Bowel: Stomach is within normal limits. Appendix appears normal. No evidence of bowel wall thickening, distention, or inflammatory changes. Vascular/Lymphatic: Note is made of a retroaortic left renal vein. The abdominal vasculature is otherwise unremarkable. There is shotty bilateral external iliac lymphadenopathy with the index lymph node measuring up to 12 mm in short axis diameter at axial image # 103/3. No frankly pathologic adenopathy seen within the abdomen and pelvis. Reproductive: Uterus and bilateral adnexa are unremarkable. Other: No abdominal wall hernia Musculoskeletal: No lytic or blastic bone lesion. No acute bone abnormality. T11 hemivertebra noted. IMPRESSION: 1. Small bilateral pleural effusions with associated bibasilar atelectasis. 2. Scattered tiny pulmonary nodules measuring up to 5 mm, nonspecific. While pulmonary metastatic disease is not strictly excluded, these are more commonly the result of remote infection or inflammation in a patient of this age. 3. Shotty bilateral axillary and external iliac lymphadenopathy, nonspecific. No frankly pathologic adenopathy seen within the chest, abdomen, or pelvis. 4. Moderate hepatic steatosis. 5. Small hiatal hernia. Electronically Signed   By: Helyn Numbers M.D.   On: 02/25/2023 19:57   CT HEAD WO CONTRAST ( ) Result Date: 02/25/2023 CLINICAL DATA:  Brain/CNS neoplasm, monitor. Worsening headache, vomiting. EXAM: CT HEAD  WITHOUT CONTRAST TECHNIQUE: Contiguous axial images were obtained from the base of the skull through the vertex without intravenous contrast. RADIATION DOSE REDUCTION: This exam was performed according to the departmental dose-optimization program which includes automated exposure control, adjustment of the mA and/or kV according to patient size and/or use of iterative reconstruction technique. COMPARISON:  Non-contrast head CT and CT angiogram head/neck 02/25/2023. Brain MRI 02/23/2023. Head CT 02/23/2023. FINDINGS: Brain: No age-advanced or lobar predominant parenchymal atrophy. Previously demonstrated foci of abnormal hypodensity within the bilateral cerebral and cerebellar hemispheres have not appreciably changed from the head CT performed earlier today at 12:47 a.m. As before, the largest lesion is located within the right parietal and occipital lobes and there is associated mass effect at this site, as well as subtle petechial blood products. Unchanged effacement of the right lateral ventricle posteriorly. No midline shift. No extra-axial fluid collection. Vascular: No hyperdense vessel. Skull: No calvarial fracture or aggressive osseous lesion. Sinuses/Orbits: No mass or acute finding within the imaged orbits. Mild mucosal thickening within the right maxillary sinus. Moderate polypoid mucosal thickening within the left maxillary sinus. 9 mm left sphenoid sinus mucous retention cyst. Minimal mucosal thickening within the right sphenoid and left ethmoid sinuses. IMPRESSION: 1. Stable non-contrast CT appearance of the brain as compared to the examination performed earlier today at 12:47 a.m. Multiple foci of abnormal hypodensity within the bilateral cerebral and cerebellar hemispheres have not significantly changed. Mass effect and subtle petechial blood products associated with a dominant lesion within the right parietal and right occipital lobes. Findings are nonspecific with broad differential considerations  including sequela of vasculitis, an infectious/inflammatory process, neoplasm/lymphoma, tumefactive demyelination/other autoimmune process and/or subacute ischemia. 2. Paranasal sinus disease as described. Electronically Signed   By: Jackey Loge D.O.   On: 02/25/2023 18:17   CT ANGIO HEAD NECK W WO CM Result Date: 02/25/2023 CLINICAL DATA:  Initial evaluation for vasculitis. EXAM: CT ANGIOGRAPHY HEAD AND NECK WITH AND WITHOUT CONTRAST TECHNIQUE: Multidetector CT imaging of the head and neck was performed using the standard protocol during  bolus administration of intravenous contrast. Multiplanar CT image reconstructions and MIPs were obtained to evaluate the vascular anatomy. Carotid stenosis measurements (when applicable) are obtained utilizing NASCET criteria, using the distal internal carotid diameter as the denominator. RADIATION DOSE REDUCTION: This exam was performed according to the departmental dose-optimization program which includes automated exposure control, adjustment of the mA and/or kV according to patient size and/or use of iterative reconstruction technique. CONTRAST:  75mL OMNIPAQUE IOHEXOL 350 MG/ML SOLN COMPARISON:  Prior CT and MRI from 02/23/2023 FINDINGS: CT HEAD FINDINGS Brain: Previously identified hypodense areas involving the right occipital lobe, posterior left insular region, right frontotemporal region, and right greater than left cerebellum noted. Associated petechial blood products about the dominant lesion at the right occipital lobe. Overall, appearance is relatively stable from prior exams. No appreciable new lesions. No other acute large vessel territory infarct. No other acute intracranial hemorrhage. No midline shift or hydrocephalus. No extra-axial fluid collection. Vascular: No abnormal hyperdense vessel. Skull: Scalp soft tissues and calvarium demonstrate no new finding. Sinuses/Orbits: Globes and orbital soft tissues within normal limits. Mild mucosal thickening noted  about the maxillary sinuses bilaterally. Left sphenoid sinus retention cyst. No mastoid effusion. Other: None. Review of the MIP images confirms the above findings CTA NECK FINDINGS Aortic arch: Standard branching. Imaged portion shows no evidence of aneurysm or dissection. No significant stenosis of the major arch vessel origins. Right carotid system: No evidence of dissection, stenosis (50% or greater), or occlusion. No beading or irregularity. Left carotid system: No evidence of dissection, stenosis (50% or greater), or occlusion. No beading or irregularity. Vertebral arteries: Proximal left vertebral artery not well visualized due to adjacent venous contamination. Visualized vertebral arteries patent without dissection or stenosis. No beading or irregularity. Skeleton: No discrete or worrisome osseous lesions. Other neck: Multiple prominent and hyperdense but not technically enlarged lymph nodes seen within the neck. No other acute finding. Upper chest: Prominent and hyperdense bilateral axillary lymph nodes, measuring up to 1.5 cm bilaterally. Prominent pre-vascular nodes measure up to 9 mm, partially visualized. Few scatter pulmonary nodule seen within the visualized lungs, largest of which measures 6 mm within the left upper lobe (series 10, image 153). Review of the MIP images confirms the above findings CTA HEAD FINDINGS Anterior circulation: Both internal carotid arteries are widely patent to the siphons without stenosis or other abnormality. A1 segments patent bilaterally. Normal anterior communicating artery complex. Both ACAs widely patent without stenosis. No M1 stenosis or occlusion. Distal MCA branches perfused and symmetric. No beading or irregularity. Posterior circulation: Both V4 segments widely patent without stenosis. Both PICA patent. Basilar widely patent without stenosis. Superior cerebellar and posterior cerebral arteries patent bilaterally. No beading or irregularity. Venous sinuses:  Patent allowing for timing the contrast bolus. Anatomic variants: None significant.  No aneurysm. Review of the MIP images confirms the above findings IMPRESSION: CT HEAD: 1. Multifocal hypodensities involving both cerebral hemispheres and cerebellum, relatively stable as compared to prior CT and MRI. Differential considerations as previously described. 2. No other new acute intracranial abnormality. CTA HEAD AND NECK: 1. Normal CTA of the head and neck. No large vessel occlusion, hemodynamically significant stenosis, or other acute vascular abnormality. No evidence for vasculitis. 2. Multiple prominent cervical, axillary, and mediastinal lymph nodes as above. Findings are nonspecific, and could possibly related to patient history of SLE. Reactive adenopathy due to an infectious or other inflammatory process could also be considered. 3. Few scattered pulmonary nodules measuring up to 6 mm within the visualized  lungs. Findings are indeterminate, and could also be either infectious or inflammatory in nature. Per Fleischner Society Guidelines, recommend a non-contrast Chest CT at 3-6 months, then consider another non-contrast Chest CT at 18-24 months. If patient is low risk for malignancy, non-contrast Chest CT at 18-24 months is optional. These guidelines do not apply to immunocompromised patients and patients with cancer. Follow up in patients with significant comorbidities as clinically warranted. For lung cancer screening, adhere to Lung-RADS guidelines. Reference: Radiology. 2017; 284(1):228-43. Electronically Signed   By: Rise Mu M.D.   On: 02/25/2023 06:24   MR CERVICAL SPINE W WO CONTRAST Result Date: 02/24/2023 CLINICAL DATA:  Multiple sclerosis EXAM: MRI CERVICAL AND THORACIC SPINE WITHOUT AND WITH CONTRAST TECHNIQUE: Multiplanar and multiecho pulse sequences of the cervical spine, to include the craniocervical junction and cervicothoracic junction, and the thoracic spine, were obtained  without and with intravenous contrast. CONTRAST:  10mL GADAVIST GADOBUTROL 1 MMOL/ML IV SOLN COMPARISON:  08/05/2022 MRI thoracic spine, 05/16/2020 MRI cervical spine FINDINGS: MRI CERVICAL SPINE FINDINGS Alignment: No listhesis. Vertebrae: No acute fracture, evidence of discitis, or suspicious osseous lesion. No abnormal enhancement. Cord: Normal morphology. T2 hyperintense foci in the cervical spinal cord at the left dorsolateral aspect at C4-C5 (series 4, image 23) and at the right lateral aspect at C6-C7 (series 4, image 29). Other previously suspected T2 hyperintense foci are not appreciated on this exam and may have been artifactual. Otherwise normal signal. No abnormal enhancement. Posterior Fossa, vertebral arteries, paraspinal tissues: No acute finding. Disc levels: C2-C3: No significant disc bulge. No spinal canal stenosis or neuroforaminal narrowing. C3-C4: No significant disc bulge. No spinal canal stenosis or neural foraminal narrowing. C4-C5: No significant disc bulge. No spinal canal stenosis or neuroforaminal narrowing. C5-C6: No significant disc bulge. No spinal canal stenosis or neuroforaminal narrowing. C6-C7: No significant disc bulge. No spinal canal stenosis or neuroforaminal narrowing. C7-T1: No significant disc bulge. No spinal canal stenosis or neuroforaminal narrowing. MRI THORACIC SPINE FINDINGS Alignment:  No listhesis. Vertebrae: No acute fracture, evidence of discitis, or suspicious osseous lesion. Redemonstrated hemivertebra at T11. Cord: Evaluation is somewhat limited by respiratory motion. Within this limitation, no abnormal spinal cord signal or enhancement is seen. Normal spinal cord morphology. In the epidural space, there is an epidural collection, which causes moderate thecal sac narrowing and anterior displacement of the thecal sac, which is new from the prior exam. This fluid tends to follow CSF on the provided sequences; for example, see series 22, image 33 and series 26,  image 30). In addition, there is mild to moderate epidural fat. No evidence of abscess or phlegmon. Paraspinal and other soft tissues: No acute finding. Disc levels: No osseous spinal canal stenosis. There is moderate thecal sac narrowing from approximately T3-T4 through T11-T12, favored to be secondary to an epidural fluid collection, which may be the sequela of the recent lumbar puncture. No high-grade neural foraminal narrowing. IMPRESSION: 1. T2 hyperintense foci in the cervical spinal cord at C4-C5 and C6-C7, consistent with the patient's history of multiple sclerosis. No abnormal enhancement to suggest active demyelination. 2. No abnormal spinal cord signal or enhancement in the thoracic spine. 3. Epidural fluid collection in the thoracic spine, which causes moderate thecal sac narrowing and anterior displacement of the thecal sac, new from the prior exam. This may be the sequela of the recent lumbar puncture. Correlate with physical exam. Electronically Signed   By: Wiliam Ke M.D.   On: 02/24/2023 19:48   MR THORACIC SPINE  W WO CONTRAST Result Date: 02/24/2023 CLINICAL DATA:  Multiple sclerosis EXAM: MRI CERVICAL AND THORACIC SPINE WITHOUT AND WITH CONTRAST TECHNIQUE: Multiplanar and multiecho pulse sequences of the cervical spine, to include the craniocervical junction and cervicothoracic junction, and the thoracic spine, were obtained without and with intravenous contrast. CONTRAST:  10mL GADAVIST GADOBUTROL 1 MMOL/ML IV SOLN COMPARISON:  08/05/2022 MRI thoracic spine, 05/16/2020 MRI cervical spine FINDINGS: MRI CERVICAL SPINE FINDINGS Alignment: No listhesis. Vertebrae: No acute fracture, evidence of discitis, or suspicious osseous lesion. No abnormal enhancement. Cord: Normal morphology. T2 hyperintense foci in the cervical spinal cord at the left dorsolateral aspect at C4-C5 (series 4, image 23) and at the right lateral aspect at C6-C7 (series 4, image 29). Other previously suspected T2  hyperintense foci are not appreciated on this exam and may have been artifactual. Otherwise normal signal. No abnormal enhancement. Posterior Fossa, vertebral arteries, paraspinal tissues: No acute finding. Disc levels: C2-C3: No significant disc bulge. No spinal canal stenosis or neuroforaminal narrowing. C3-C4: No significant disc bulge. No spinal canal stenosis or neural foraminal narrowing. C4-C5: No significant disc bulge. No spinal canal stenosis or neuroforaminal narrowing. C5-C6: No significant disc bulge. No spinal canal stenosis or neuroforaminal narrowing. C6-C7: No significant disc bulge. No spinal canal stenosis or neuroforaminal narrowing. C7-T1: No significant disc bulge. No spinal canal stenosis or neuroforaminal narrowing. MRI THORACIC SPINE FINDINGS Alignment:  No listhesis. Vertebrae: No acute fracture, evidence of discitis, or suspicious osseous lesion. Redemonstrated hemivertebra at T11. Cord: Evaluation is somewhat limited by respiratory motion. Within this limitation, no abnormal spinal cord signal or enhancement is seen. Normal spinal cord morphology. In the epidural space, there is an epidural collection, which causes moderate thecal sac narrowing and anterior displacement of the thecal sac, which is new from the prior exam. This fluid tends to follow CSF on the provided sequences; for example, see series 22, image 33 and series 26, image 30). In addition, there is mild to moderate epidural fat. No evidence of abscess or phlegmon. Paraspinal and other soft tissues: No acute finding. Disc levels: No osseous spinal canal stenosis. There is moderate thecal sac narrowing from approximately T3-T4 through T11-T12, favored to be secondary to an epidural fluid collection, which may be the sequela of the recent lumbar puncture. No high-grade neural foraminal narrowing. IMPRESSION: 1. T2 hyperintense foci in the cervical spinal cord at C4-C5 and C6-C7, consistent with the patient's history of multiple  sclerosis. No abnormal enhancement to suggest active demyelination. 2. No abnormal spinal cord signal or enhancement in the thoracic spine. 3. Epidural fluid collection in the thoracic spine, which causes moderate thecal sac narrowing and anterior displacement of the thecal sac, new from the prior exam. This may be the sequela of the recent lumbar puncture. Correlate with physical exam. Electronically Signed   By: Wiliam Ke M.D.   On: 02/24/2023 19:48   EEG adult Result Date: 02/24/2023 Charlsie Quest, MD     02/24/2023  4:55 PM Patient Name: Emily Hicks MRN: [id number] Epilepsy Attending: Charlsie Quest Referring Physician/Provider: Gordy Councilman, MD Date: 02/24/2023 Duration: 22.36 mins Patient history: 38yo F with MS, intermittent visual disturbance on left. EEG to evaluate for seizure Level of alertness: Awake AEDs during EEG study: None Technical aspects: This EEG study was done with scalp electrodes positioned according to the 10-20 International system of electrode placement. Electrical activity was reviewed with band pass filter of 1-70Hz , sensitivity of 7 uV/mm, display speed of 45mm/sec with a 60Hz   notched filter applied as appropriate. EEG data were recorded continuously and digitally stored.  Video monitoring was available and reviewed as appropriate. Description: EEG showed continuous generalized and maximal right parieto-occipital sharply contoured 3 to 6 Hz theta-delta slowing. Hyperventilation and photic stimulation were not performed.   ABNORMALITY - Continuous slow, generalized and maximal right parieto-occipital IMPRESSION: This study is suggestive of cortical dysfunction arising from right parieto-occipital likely secondary to underlying structural abnormality. Additionally there is moderate diffuse encephalopathy. No seizures or definite epileptiform discharges were seen throughout the recording. Emily Hicks   DG FL GUIDED LUMBAR PUNCTURE Result Date: 02/24/2023 CLINICAL  DATA:  Patient with history of lupus, multiple sclerosis who presented to the ED with complaints of headache, blurred vision and weakness for 2 weeks. MRI significant for multiple subacute lesions concerning CNS vasculitis, cerebritis, tumefactive MS or infection. Request for small volume LP. EXAM: LUMBAR PUNCTURE UNDER FLUOROSCOPY PROCEDURE: An appropriate skin entry site was determined fluoroscopically. Operator donned sterile gloves and mask. Skin site was marked, then prepped with Betadine, draped in usual sterile fashion, and infiltrated locally with 1% lidocaine. A 20 gauge spinal needle advanced into the thecal sac at L4-L5. Clear colorless CSF spontaneously returned, with opening pressure of 25 cm water. 6.5 ml CSF were collected and divided among 4 sterile vials for the requested laboratory studies. The needle was then removed. The patient tolerated the procedure well and there were no complications. FLUOROSCOPY: Radiation Exposure Index (as provided by the fluoroscopic device): 3.1 mGy Kerma IMPRESSION: Technically successful lumbar puncture under fluoroscopy. This exam was performed by Lynnette Caffey, PA-C, and was supervised and interpreted by Marliss Coots, MD. Electronically Signed   By: Marliss Coots M.D.   On: 02/24/2023 12:11   MR Brain W and Wo Contrast Result Date: 02/23/2023 CLINICAL DATA:  Demyelinating disease EXAM: MRI HEAD WITHOUT AND WITH CONTRAST TECHNIQUE: Multiplanar, multiecho pulse sequences of the brain and surrounding structures were obtained without and with intravenous contrast. CONTRAST:  10mL GADAVIST GADOBUTROL 1 MMOL/ML IV SOLN COMPARISON:  Same-day head, brain MR 08/05/2022 FINDINGS: Brain: There are multifocal regions of predominantly cortical diffusion restriction, involving the bilateral insular regions, right occipital lobe, in the left cerebellum. There is contrast enhancement associated with these regions of diffusion restriction, which is predominantly cortical.  There is disproportionate degree of T2/FLAIR hyperintense signal abnormality surrounding the sites of diffusion restriction or contrast enhancement. Vascular: Normal flow voids. Skull and upper cervical spine: Normal marrow signal. Sinuses/Orbits: No middle ear or mastoid effusion. Paranasal sinuses are notable for mucosal thickening in the bilateral maxillary sinuses. Orbits are unremarkable. Other: None. IMPRESSION: Multifocal regions of predominantly cortical diffusion restriction with associated contrast enhancement, involving the bilateral insular regions, right occipital lobe, and left cerebellum. Findings are nonspecific, but with predominantly cortical involvement, CNS vasculitis is a differential consideration. Given patient's history, an additional differential consideration is demyelinating disease. Recommend further evaluation with a CTA head and neck angiogram. Electronically Signed   By: Lorenza Cambridge M.D.   On: 02/23/2023 15:46   CT Head Wo Contrast Result Date: 02/23/2023 CLINICAL DATA:  Headache. Blurred vision, nausea, and vomiting. History of lupus and multiple sclerosis. EXAM: CT HEAD WITHOUT CONTRAST TECHNIQUE: Contiguous axial images were obtained from the base of the skull through the vertex without intravenous contrast. RADIATION DOSE REDUCTION: This exam was performed according to the departmental dose-optimization program which includes automated exposure control, adjustment of the mA and/or kV according to patient size and/or use of iterative reconstruction technique. COMPARISON:  Head MRI 08/05/2022 FINDINGS: Brain: There is a 5 cm region of confluent heterogeneous hypodensity/edema in the posteromedial right parietal lobe. There is localized sulcal effacement and partial effacement of the right lateral ventricle. Smaller areas of abnormal hypodensity are present in the right frontal operculum and superior left temporal lobe posterior to the sylvian fissure. No acute intracranial  hemorrhage, midline shift, hydrocephalus, or extra-axial fluid collection is evident. Vascular: No hyperdense vessel. Skull: No acute fracture or suspicious osseous lesion. Sinuses/Orbits: Mucous retention cyst in the left sphenoid sinus. Clear mastoid air cells. Unremarkable orbits. Other: None. IMPRESSION: Multifocal cerebral hypodensities, including a 5 cm edematous area in the right parietal lobe. These are indeterminate with considerations including subacute ischemia, neoplasm, an infectious/inflammatory process, and tumefactive demyelination. A brain MRI without and with contrast is recommended for further evaluation. Electronically Signed   By: Sebastian Ache M.D.   On: 02/23/2023 11:32    Microbiology: Results for orders placed or performed during the hospital encounter of 02/23/23  CSF culture w Gram Stain     Status: None   Collection Time: 02/24/23 12:08 PM   Specimen: PATH Cytology CSF; Cerebrospinal Fluid  Result Value Ref Range Status   Specimen Description CSF  Final   Special Requests NONE  Final   Gram Stain   Final    WBC PRESENT, PREDOMINANTLY MONONUCLEAR NO ORGANISMS SEEN CYTOSPIN SMEAR    Culture   Final    NO GROWTH 3 DAYS Performed at [facility], [address], [place], [place] [address]    Report Status 02/27/2023 FINAL  Final  Fungus Culture With Stain     Status: None (Preliminary result)   Collection Time: 02/24/23 12:08 PM   Specimen: PATH Cytology CSF; Cerebrospinal Fluid  Result Value Ref Range Status   Fungus Stain Final report  Final    Comment: (NOTE) Performed At: [facility] [address] [place], [place] [id number] Jolene Schimke MD XB:[id number]    Fungus (Mycology) Culture PENDING  Incomplete   Fungal Source CSF  Final    Comment: Performed at [facility], [address], [place], [place] [address]  Culture, Fungus without Smear     Status: None (Preliminary result)   Collection Time: 02/24/23 12:08 PM   Specimen: PATH  Cytology CSF; Cerebrospinal Fluid  Result Value Ref Range Status   Specimen Description CSF  Final   Special Requests NONE  Final   Culture   Final    NO FUNGUS ISOLATED AFTER 7 DAYS Performed at [facility], [address] [place], [place] [address]    Report Status PENDING  Incomplete  MT-RIF NAA W Cult, Non-Sputum     Status: None (Preliminary result)   Collection Time: 02/24/23 12:08 PM   Specimen: PATH Cytology CSF; Cerebrospinal Fluid  Result Value Ref Range Status   Source CSF  Final    Comment: Performed at [facility], [address] [place], [place] [address]   Specimen Source Comment  Final    Comment: Cerebrospinal fluid (CSF)   M tuberculosis complex NOT DETECTED NOT DETECTED Final    Comment: (NOTE) This test was developed and its performance characteristics determined by [facility]. It has not been cleared or approved by the Food and Drug Administration.    Rifampin Not applicable NOT DETECTED Final    Comment: (NOTE) This test was developed and its performance characteristics determined by [facility]. It has not been cleared or approved by the Food and  Drug Administration.    AFB Specimen Processing Direct Inoculation  Final    Comment: (NOTE) Performed At: [facility] [address] [place], [place] [id number] Jolene Schimke MD WU:[id number]    Acid Fast Culture PENDING  Incomplete  Anaerobic culture w Gram Stain     Status: None   Collection Time: 02/24/23 12:08 PM   Specimen: PATH Cytology CSF; Cerebrospinal Fluid  Result Value Ref Range Status   Specimen Description CSF  Final   Special Requests NONE  Final   Culture   Final    NO ANAEROBES ISOLATED Performed at [facility], [address] [place], [place] [address]    Report Status 03/01/2023 FINAL  Final  Fungus Culture Result     Status: None   Collection Time: 02/24/23 12:08 PM  Result Value Ref Range Status   Result 1 Comment  Final    Comment:  (NOTE) KOH/Calcofluor preparation:  no fungus observed. Performed At: [facility] [address] [place], [place] [id number] Jolene Schimke MD [id number]   Culture, blood (Routine X 2) w Reflex to ID Panel     Status: None   Collection Time: 02/26/23  6:47 AM   Specimen: BLOOD  Result Value Ref Range Status   Specimen Description BLOOD BLOOD RIGHT HAND  Final   Special Requests   Final    BOTTLES DRAWN AEROBIC AND ANAEROBIC Blood Culture results may not be optimal due to an inadequate volume of blood received in culture bottles   Culture   Final    NO GROWTH 5 DAYS Performed at [facility], [address] [place], [place] [address]    Report Status 03/03/2023 FINAL  Final  Culture, blood (Routine X 2) w Reflex to ID Panel     Status: None   Collection Time: 02/26/23  6:52 AM   Specimen: BLOOD RIGHT ARM  Result Value Ref Range Status   Specimen Description BLOOD RIGHT ARM  Final   Special Requests   Final    BOTTLES DRAWN AEROBIC AND ANAEROBIC Blood Culture results may not be optimal due to an inadequate volume of blood received in culture bottles   Culture   Final    NO GROWTH 5 DAYS Performed at [facility], [address] [place], [place] [address]    Report Status 03/03/2023 FINAL  Final    Labs: CBC: Recent Labs  Lab 02/27/23 0622 02/28/23 0650 03/01/23 0639 03/02/23 0644 03/03/23 0522  WBC 4.6 1.6* 3.4* 5.0 5.6  HGB 9.5* 10.0* 9.5* 9.1* 9.2*  HCT 29.6* 31.0* 30.0* 29.0* 30.0*  MCV 85.8 85.9 86.5 87.6 88.5  PLT 263 300 304 266 248   Basic Metabolic Panel: Recent Labs  Lab 02/25/23 0549 02/26/23 0647 02/27/23 0622 02/28/23 0650 03/01/23 0639 03/02/23 0644 03/03/23 0522  NA 141 138 142 142 142 143 140  K 3.8 3.6 3.7 4.7 4.5 4.5 4.3  CL 114* 111 113* 111 116* 115* 113*  CO2 20* 20* 21* 20* 20* 20* 19*  GLUCOSE 101* 113* 89 125* 123* 118* 119*  BUN 8 9 9  22* 34* 37* 41*  CREATININE 0.82 0.89 1.03* 1.22* 1.60* 1.21* 1.23*   CALCIUM 8.2* 7.9* 8.0* 8.2* 7.8* 7.7* 7.7*  MG 1.9 1.8  --   --   --   --   --    Liver Function Tests: Recent Labs  Lab 02/27/23 0622 02/28/23 0650 03/01/23 0639 03/02/23 0644 03/03/23 0522  AST 13* 11* 12*  10* 10*  ALT 7 6 6 6 7   ALKPHOS 41 48 43 34* 33*  BILITOT 0.6 0.5 0.5 0.5 0.4  PROT 5.8* 6.3* 5.9* 5.7* 5.6*  ALBUMIN 2.4* 2.5* 2.5* 2.4* 2.3*   CBG: Recent Labs  Lab 03/02/23 0630 03/02/23 1131 03/02/23 2110 03/03/23 0610 03/03/23 1120  GLUCAP 114* 97 135* 108* 110*    Discharge time spent: less than 30 minutes.  Signed: Rickey Barbara, MD [facility] 03/03/2023

## 2023-03-05 LAB — QUANTIFERON-TB GOLD PLUS (RQFGPL)
QuantiFERON Mitogen Value: 0.24 [IU]/mL
QuantiFERON Nil Value: 0.35 [IU]/mL
QuantiFERON TB1 Ag Value: 0.22 [IU]/mL
QuantiFERON TB2 Ag Value: 0.29 [IU]/mL

## 2023-03-05 LAB — QUANTIFERON-TB GOLD PLUS: QuantiFERON-TB Gold Plus: UNDETERMINED — AB

## 2023-03-05 LAB — OLIGOCLONAL BANDS, CSF + SERM

## 2023-03-07 ENCOUNTER — Encounter: Payer: Self-pay | Admitting: Neurology

## 2023-03-10 LAB — MISC LABCORP TEST (SEND OUT): Labcorp test code: 9985

## 2023-03-16 ENCOUNTER — Encounter: Payer: Self-pay | Admitting: Occupational Therapy

## 2023-03-16 ENCOUNTER — Ambulatory Visit: Payer: 59 | Admitting: Occupational Therapy

## 2023-03-16 ENCOUNTER — Encounter: Payer: Self-pay | Admitting: Physical Therapy

## 2023-03-16 ENCOUNTER — Ambulatory Visit: Payer: 59 | Attending: Internal Medicine | Admitting: Physical Therapy

## 2023-03-16 DIAGNOSIS — G939 Disorder of brain, unspecified: Secondary | ICD-10-CM | POA: Diagnosis not present

## 2023-03-16 DIAGNOSIS — G35 Multiple sclerosis: Secondary | ICD-10-CM | POA: Insufficient documentation

## 2023-03-16 DIAGNOSIS — M6281 Muscle weakness (generalized): Secondary | ICD-10-CM | POA: Diagnosis present

## 2023-03-16 DIAGNOSIS — R29818 Other symptoms and signs involving the nervous system: Secondary | ICD-10-CM

## 2023-03-16 DIAGNOSIS — R2689 Other abnormalities of gait and mobility: Secondary | ICD-10-CM | POA: Diagnosis present

## 2023-03-16 DIAGNOSIS — R2681 Unsteadiness on feet: Secondary | ICD-10-CM | POA: Diagnosis present

## 2023-03-16 DIAGNOSIS — R278 Other lack of coordination: Secondary | ICD-10-CM | POA: Insufficient documentation

## 2023-03-16 DIAGNOSIS — R208 Other disturbances of skin sensation: Secondary | ICD-10-CM

## 2023-03-16 NOTE — Therapy (Signed)
 OUTPATIENT PHYSICAL THERAPY NEURO EVALUATION   Patient Name: Emily Hicks MRN: [id number] DOB:1985-07-18, 38 y.o., female Today's Date: 03/16/2023   PCP: No PCP Per Pt REFERRING PROVIDER: Oral Billings, MD  END OF SESSION:  PT End of Session - 03/16/23 1859     Visit Number 1    Number of Visits 9    Date for PT Re-Evaluation 05/15/23    Authorization Type UHC    Authorization - Visit Number 1    Authorization - Number of Visits 20    PT Start Time 1050    PT Stop Time 1148    PT Time Calculation (min) 58 min    Equipment Utilized During Treatment Gait belt    Activity Tolerance Patient tolerated treatment well    Behavior During Therapy WFL for tasks assessed/performed             Past Medical History:  Diagnosis Date   Cervical intraepithelial neoplasia III    Gallstones    GERD (gastroesophageal reflux disease)    only prn OTC occasionally   History of blood transfusion 2020   Lupus dx 2020   Multiple sclerosis (HCC) dx 2020   Numbness and tingling of left leg    Past Surgical History:  Procedure Laterality Date   CERVICAL CONIZATION W/BX N/A 01/16/2020   Procedure: CERVICAL CONE BIOPSY, ENDOCERVICAL CURETTAGE;  Surgeon: Mallory Seaman, MD;  Location: [facility];  Service: Gynecology;  Laterality: N/A;  request 7:30am OR start in [place] Gyn block requests 45 minutes   CHOLECYSTECTOMY N/A 12/28/2013   Procedure: LAPAROSCOPIC CHOLECYSTECTOMY;  Surgeon: Boyce Byes, MD;  Location: [facility];  Service: General;  Laterality: N/A;   MUSCLE BIOPSY Right 11/06/2022   Procedure: RIGHT THIGH MUSCLE BIOPSY;  Surgeon: Melvenia Stabs, MD;  Location: [facility];  Service: General;  Laterality: Right;   Patient Active Problem List   Diagnosis Date Noted   Rhabdomyolysis 11/05/2022   AKI (acute kidney injury) (HCC) 11/04/2022   Recurrent idiopathic pericarditis 08/04/2022   Complicated UTI (urinary tract infection) 06/16/2022   Chest pain  06/13/2021   Sepsis (HCC) 06/13/2021   Rash and nonspecific skin eruption 05/21/2020   CIN III (cervical intraepithelial neoplasia grade III) with severe dysplasia 02/09/2020   HSIL (high grade squamous intraepithelial lesion) on Pap smear of cervix 11/03/2019   Cervical cancer screening 09/30/2019   Essential hypertension 09/19/2018   SIRS (systemic inflammatory response syndrome) (HCC) 08/16/2018   SLE (systemic lupus erythematosus related syndrome) (HCC) 08/16/2018   Pancytopenia (HCC)    Anxiety and depression    Elevated liver enzymes 08/02/2018   Normocytic anemia    Tachycardia 07/16/2018   Multiple sclerosis (HCC) 03/31/2018   Common migraine 08/26/2017   Chondromalacia, patella, left 06/24/2017   Healthcare maintenance 12/20/2015   Contraceptive management 10/11/2014   Hidradenitis suppurativa of left axilla 06/07/2013   Dysplasia of cervix, low grade (CIN 1) 07/03/2011   Obesity, Class I, BMI 30.0-34.9 (see actual BMI) 05/14/2011    ONSET DATE: 02-23-23  REFERRING DIAG:  Diagnosis  G93.9 (ICD-10-CM) - Brain lesion  G35 (ICD-10-CM) - Multiple sclerosis (HCC)    THERAPY DIAG:  Muscle weakness (generalized)  Other abnormalities of gait and mobility  Other symptoms and signs involving the nervous system  Unsteadiness on feet  Rationale for Evaluation and Treatment: Rehabilitation  SUBJECTIVE:  SUBJECTIVE STATEMENT:   Pt is a 38 yr old lady diagnosed with MS and Lupus in 2020; pt was most recently hospitalized at [facility] 02-23-23 - 03-03-23  Pt reports she has been rollator since discharge from hospital on 03-03-23  - doesn't usually use in the home; pt has tingling on Lt side of body - has had since 2020; pt reports balance has not been good since October, when she was hospitalized in  Oct. 2024;  pt reports her low back feels very tight.  Says Lt side is more impaired than her Rt side due to MS  Pt accompanied by: self  PERTINENT HISTORY: Diagnosed with MS and Lupus in 2020:  per chart note  "38 y.o. female with past medical history significant for multiple sclerosis, lupus, GERD, cervical intraepithelial neoplasia, cholelithiasis who presented to [facility] ED Pt  on 1/28 with 2-week history of progressive generalized weakness, malaise, headache. Also endorsed nasal congestion, nausea, nosebleeds. Currently on Plaquenil  and azathioprine  for her lupus. Followed by neurology outpatient, Dr. Godwin Lat but currently not on any additional disease modifying therapy for her MS currently. Sister also endorses that she has been having a hard time "gripping" items. She has been taking Excedrin without much relief regarding her headaches. Denies any urinary symptoms, no visual changes, no fever, no chills, no rigors, no muscle aches."  Brain lesion, migraines, rhabdomyolysis, tachycardia  PAIN: Pt reports no true pain -  Are you having pain?  "Uncomfortable in my lower body area"  PRECAUTIONS: Fall  RED FLAGS: None   WEIGHT BEARING RESTRICTIONS: No  FALLS: Has patient fallen in last 6 months? Yes. Number of falls 1- inside her home - occurred late September  LIVING ENVIRONMENT: Lives with: lives with their family Lives in: House/apartment Stairs: Yes: External: 2 steps; on left going up Has following equipment at home: Walker - 4 wheeled, shower chair, and bed side commode  PLOF: Independent - pt has returned to work (accounting) - is currently remotely from home but plans to return to office when able: is not driving currently   PATIENT GOALS: wants to improve balance and increase LE strength  OBJECTIVE:  Note: Objective measures were completed at Evaluation unless otherwise noted.  DIAGNOSTIC FINDINGS: MRI brain with and without contrast with multifocal regions of  cortical diffusion restriction associated with contrast-enhancement bilateral insular regions, right occipital lobe, left cerebellum.  Imaging findings concerning for inflammatory vs CNS vasculitis vs CVA vs lupus cerebritis vs tumefactive MS vs infection.  CT angiogram head/neck with no large vessel occlusion, no stenosis or other acute vascular abnormality, no evidence for vasculitis, multiple prominent cervical, axillary, mediastinal lymph nodes, few scattered pulmonary nodules.  MR T/L-spine with T2 hyperintense foci cervical spine cord C4-5, C6/7 consistent with known MS, no abnormal enhancement to suggest active demyelination, no abnormal spinal cord signal or enhancement in the thoracic spine, epidural fluid collection thoracic spine likely from recent LP.  Underwent lumbar puncture 02/24/2023 by IR with initial fluid analysis not consistent with infection; meningitis/encephalitis panel negative.  EEG with generalized and maximal right parieto-occipital slowing suggestive of cortical dysfunction likely secondary to underlying structural normality, no seizure/epileptiform discharges noted.  COGNITION: Overall cognitive status: Within functional limits for tasks assessed   SENSATION: Pt reports numbness & tingling in LE's - reports this was occurring prior to recent hospitalization  COORDINATION: WFL's    POSTURE: rounded shoulders and forward head  LOWER EXTREMITY ROM:   WFL's bil. LE's    LOWER EXTREMITY MMT:  MMT Right Eval Left Eval  Hip flexion 4 3+  Hip extension 3- 2+  Hip abduction    Hip adduction    Hip internal rotation    Hip external rotation    Knee flexion 3+ 3  Knee extension 4 4  Ankle dorsiflexion 4 4  Ankle plantarflexion    Ankle inversion    Ankle eversion    (Blank rows = not tested)  BED MOBILITY:  Modified independent  TRANSFERS: Assistive device utilized: None  Sit to stand: Modified independence Stand to sit: Modified  independence   STAIRS:  TBA   GAIT: Gait pattern: step through pattern Distance walked: 33' Assistive device utilized: Environmental consultant - 4 wheeled Level of assistance: Modified independence Comments: no device for TUG  FUNCTIONAL TESTS:  5 times sit to stand: 38.09 secs without UE support from mat Timed up and go (TUG): 29.69 secs without device 10 meter walk test: 18.72 secs = 1.75 ft/sec with rollator Pt stood for 1" unsupported with SBA                                                                                                                             TREATMENT DATE: 03-16-23  TherEx:  HEP - for bil. LE strengthening  Access Code: [email or web address] URL: [email or web address] Date: 03/16/2023 Prepared by: Johnnette Nakayama  Exercises - Supine Bridge  - 1 x daily - 7 x weekly - 1 sets - 10 reps - 5 sec hold - Clamshell  - 1 x daily - 7 x weekly - 1 sets - 10 reps - 3-5 sec hold - Sit to Stand  - 1 x daily - 7 x weekly - 1 sets - 10 reps - Standing Hip Extension with Counter Support  - 1 x daily - 7 x weekly - 3 sets - 10 reps - Seated Hamstring Curl with Anchored Resistance  - 1 x daily - 7 x weekly - 1 sets - 10 reps - Hooklying Single Knee to Chest  - 1 x daily - 7 x weekly - 1 sets - 10 reps - Single Knee to Chest Stretch  - 1 x daily - 7 x weekly - 1 sets - 2 reps - 15 sec hold - Seated Knee Extension with Resistance  - 1 x daily - 7 x weekly - 1 sets - 10 reps - 5 sec hold hold  PATIENT EDUCATION: Education details: HEP for bil. LE strengthening Person educated: Patient Education method: Explanation, Demonstration, and Handouts Education comprehension: verbalized understanding and returned demonstration  HOME EXERCISE PROGRAM: Medbridge [email or web address]  GOALS: Goals reviewed with patient? Yes  SHORT TERM GOALS: Target date: 04-17-23   Pt will increase Berg balance test score by at least 5 points to demo improved balance and reduced fall risk. Baseline: Goal status:  INITIAL  2.  Improve TUG score to </= 24 secs without use of device to demo improved mobility and reduced fall risk. Baseline: 29.69 secs without  device Goal status: INITIAL  3.  Improve 5x sit to stand score to </= 30 secs without UE support from high/low mat table. Baseline: 38.09 secs without UE support Goal status: INITIAL  4.  Increase gait velocity to >/= 2.2 ft/sec with rollator for increased gait efficiency. Baseline: 18.72 secs = 1.75 ft/sec with rollator Goal status: INITIAL  5.  Independent in HEP for bil. LE strengthening. Baseline:  Goal status: INITIAL  6.  Pt will ambulate 115' without device with SBA on flat, even surface.  Baseline:  Goal status: INITIAL   LONG TERM GOALS: Target date: 05-15-23  Pt will increase Berg balance test score by at least 10 points to demo improved balance and reduced fall risk. Baseline:  Goal status: INITIAL  2.   Improve TUG score to </= 19 secs without use of device to demo improved mobility and reduced fall risk. Baseline: 29.69 secs without device Goal status: INITIAL  3.   Improve 5x sit to stand score to </= 30 secs without UE support from high/low mat table. Baseline: 38.09 secs without UE support Goal status: INITIAL  4.   Increase gait velocity to >/= 2.6 ft/sec with rollator for increased gait efficiency. Baseline: 18.72 secs = 1.75 ft/sec with rollator Goal status: INITIAL  5.  Pt will ambulate 350' without device with SBA on flat, even surface.  Baseline:  Goal status: INITIAL  6.  Independent in updated HEP for bil. LE strengthening. Baseline:  Goal status: INITIAL  ASSESSMENT:  CLINICAL IMPRESSION: Patient is a 38 y.o. female who was seen today for physical therapy evaluation and treatment for LE weakness, gait and balance impairments due to MS/brain lesion.  PMH also includes Lupus and rhabdomyolysis with recent hospitalization at [facility] from 02-23-23 - 03-03-23.  Pt is currently using a rollator for assistance  with community ambulation but reports she usually does not use it in her home.  Pt presents with bil. LE weakness with > proximal weakness than distal weakness and LLE is slightly weaker than RLE.  Pt is at fall risk per TUG score of 29.69 secs (performed without use of rollator) and gait velocity = 1.75 ft/sec with use of rollator.  Pt will benefit from PT to address LE weakness, gait and balance deficits.   OBJECTIVE IMPAIRMENTS: Abnormal gait, decreased balance, decreased endurance, decreased strength, and impaired tone.   ACTIVITY LIMITATIONS: carrying, lifting, bending, standing, squatting, stairs, transfers, and locomotion level  PARTICIPATION LIMITATIONS: meal prep, cleaning, laundry, driving, shopping, community activity, and occupation  PERSONAL FACTORS: Time since onset of injury/illness/exacerbation and 1-2 comorbidities: MS and Lupus  are also affecting patient's functional outcome.   REHAB POTENTIAL: Good  CLINICAL DECISION MAKING: Evolving/moderate complexity  EVALUATION COMPLEXITY: Moderate  PLAN:  PT FREQUENCY: 1-2x/week  PT DURATION: 8 weeks  PLANNED INTERVENTIONS: 97110-Therapeutic exercises, 97530- Therapeutic activity, [id number]- Neuromuscular re-education, [phone]- Self Care, [id number]- Gait training, [phone]- Aquatic Therapy, Patient/Family education, Balance training, and Stair training  PLAN FOR NEXT SESSION: check HEP issued on 03-16-23; do Berg    Ky Moskowitz Suzanne, PT 03/16/2023, 7:18 PM

## 2023-03-16 NOTE — Therapy (Signed)
 OUTPATIENT OCCUPATIONAL THERAPY NEURO EVALUATION  Patient Name: Emily Hicks MRN: [id number] DOB:08-Jan-1986, 38 y.o., female Today's Date: 03/16/2023  PCP: None yet.  (Dr. Godwin Lat neurologist) REFERRING PROVIDER: Oral Billings, MD  END OF SESSION:  OT End of Session - 03/16/23 1211     Visit Number 1    Number of Visits 9    Date for OT Re-Evaluation 05/14/23    Authorization Type UHC, VL: 20 each discipline, no auth required    OT Start Time 1000    OT Stop Time 1045    OT Time Calculation (min) 45 min    Activity Tolerance Patient tolerated treatment well    Behavior During Therapy WFL for tasks assessed/performed;Flat affect             Past Medical History:  Diagnosis Date   Cervical intraepithelial neoplasia III    Gallstones    GERD (gastroesophageal reflux disease)    only prn OTC occasionally   History of blood transfusion 2020   Lupus dx 2020   Multiple sclerosis (HCC) dx 2020   Numbness and tingling of left leg    Past Surgical History:  Procedure Laterality Date   CERVICAL CONIZATION W/BX N/A 01/16/2020   Procedure: CERVICAL CONE BIOPSY, ENDOCERVICAL CURETTAGE;  Surgeon: Mallory Seaman, MD;  Location: [facility];  Service: Gynecology;  Laterality: N/A;  request 7:30am OR start in [place] Gyn block requests 45 minutes   CHOLECYSTECTOMY N/A 12/28/2013   Procedure: LAPAROSCOPIC CHOLECYSTECTOMY;  Surgeon: Boyce Byes, MD;  Location: [facility];  Service: General;  Laterality: N/A;   MUSCLE BIOPSY Right 11/06/2022   Procedure: RIGHT THIGH MUSCLE BIOPSY;  Surgeon: Melvenia Stabs, MD;  Location: [facility];  Service: General;  Laterality: Right;   Patient Active Problem List   Diagnosis Date Noted   Rhabdomyolysis 11/05/2022   AKI (acute kidney injury) (HCC) 11/04/2022   Recurrent idiopathic pericarditis 08/04/2022   Complicated UTI (urinary tract infection) 06/16/2022   Chest pain 06/13/2021   Sepsis (HCC) 06/13/2021   Rash and  nonspecific skin eruption 05/21/2020   CIN III (cervical intraepithelial neoplasia grade III) with severe dysplasia 02/09/2020   HSIL (high grade squamous intraepithelial lesion) on Pap smear of cervix 11/03/2019   Cervical cancer screening 09/30/2019   Essential hypertension 09/19/2018   SIRS (systemic inflammatory response syndrome) (HCC) 08/16/2018   SLE (systemic lupus erythematosus related syndrome) (HCC) 08/16/2018   Pancytopenia (HCC)    Anxiety and depression    Elevated liver enzymes 08/02/2018   Normocytic anemia    Tachycardia 07/16/2018   Multiple sclerosis (HCC) 03/31/2018   Common migraine 08/26/2017   Chondromalacia, patella, left 06/24/2017   Healthcare maintenance 12/20/2015   Contraceptive management 10/11/2014   Hidradenitis suppurativa of left axilla 06/07/2013   Dysplasia of cervix, low grade (CIN 1) 07/03/2011   Obesity, Class I, BMI 30.0-34.9 (see actual BMI) 05/14/2011    ONSET DATE: 03/02/2023 (referral date)   REFERRING DIAG: G93.9 (ICD-10-CM) - Brain lesion G35 (ICD-10-CM) - Multiple sclerosis (HCC)  THERAPY DIAG:  Muscle weakness (generalized)  Other lack of coordination  Unsteadiness on feet  Other disturbances of skin sensation  Other symptoms and signs involving the nervous system  Rationale for Evaluation and Treatment: Rehabilitation  SUBJECTIVE:   SUBJECTIVE STATEMENT: I have had recent exacerbations in symptoms Pt accompanied by: self  PERTINENT HISTORY: Hospital ED on 1/20 with 2-week history of progressive generalized weakness, malaise, headache. Transferred to [facility] 1/20 for neurological work up. MRI reveals  Multifocal regions of predominantly cortical diffusion restriction with associated contrast enhancement, involving the bilateral insular regions, right occipital lobe, and left cerebellum. EEG suggestive cortical dysfunction arising from right parieto-occipital likely secondary to underlying structural abnormality. 1/21 underwent LP   PMH: multiple sclerosis (diagnosed 2020), lupus (diagnosed 2020), GERD, cervical intraepithelial neoplasia, cholelithiasis   Imaging findings concerning for inflammatory vs CNS vasculitis vs CVA vs lupus cerebritis vs tumefactive MS vs infection  PRECAUTIONS: Fall  WEIGHT BEARING RESTRICTIONS: No  PAIN:  Are you having pain?  Pt reports she is uncomfortable in lower back   FALLS: Has patient fallen in last 6 months? Yes. 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  HAND DOMINANCE: Right  ADLs: Transfers/ambulation related to ADLs: using rollator currently but only needs with exacerbations Eating: mod I  Grooming: mod I (fatigues with styling hair)  UB Dressing: slower but mod I  LB Dressing: slower but mod I  Toileting: mod I - difficulty w/ lower toilets Bathing: mod I with shower chair Tub Shower transfers: mod I  Equipment: Shower seat with back and 1 small grab bar  Pt back to work full time and reports typing is doing fine  IADLs: Shopping: when pt is doing well, pt does. Husband has been doing since last October Light housekeeping: pt does light cleaning normally but hasn't since October Meal Prep: light cooking but not since October Community mobility: pt currently not driving since January hospitalization Medication management: mod I w/ pillbox (pt loads)  Financial management: mod I  Handwriting: 100% legible and Mild micrographia (normal for her)  MOBILITY STATUS:  using rollator currently since last exacerbation   ACTIVITY TOLERANCE: Activity tolerance: fatigues  easily (worse since October)   UPPER EXTREMITY ROM:  BUE AROM WFL's (but more difficulty in high flexion)  UPPER EXTREMITY MMT:   RUE MMT 4/5, LUE MMT 3+/5 grossly   HAND FUNCTION: Grip strength: Right: 28  lbs; Left: 20 lbs  COORDINATION: 9 Hole Peg test: Right: 30.79 sec; Left: 32.55 sec (? Mild apraxia)  SENSATION: Pins and needles sensation entire Lt side  EDEMA: none in Ue's but some in both feet/ankles  MUSCLE TONE: not tested  COGNITION: Overall cognitive status:  slower processing   VISION: Subjective report: Pt reports vision deficits has resolved - pt initially had visual deficits including Lt visual field loss and sensitivity to light Baseline vision: Wears glasses all the time Visual history:  none  VISION ASSESSMENT: Not tested   PERCEPTION: Not tested  PRAXIS: Not tested  OBSERVATIONS: Pt has not returned to IADLS since October exacerbation. However, pt has returned to work w/ no problems reported.  Overall fatigue and muscle weakness  TREATMENT DATE: 03/16/23  Pt issued energy conservation techniques and reviewed   Also discussed avoiding hot tubs, extreme hot or cold, sweating/raise in core temperatures (especially in summer) and avoid extreme fatigue - stopping before this with activities/exercises  Reviewed O.T. POC    PATIENT EDUCATION: Education details: energy conservation techniques, general considerations with MS, OT POC Person educated: Patient Education method: Explanation and Handouts Education comprehension: verbalized understanding  HOME EXERCISE PROGRAM: N/A   GOALS: Goals reviewed with patient? Yes  SHORT TERM GOALS: Target date: 04/13/23  Independent with HEP for UE strength, grip strength, and high level coordination Baseline: Goal status: INITIAL  2.  Pt to verbalize understanding with energy  conservation techniques Baseline:  Goal status: IN PROGRESS  3.  Pt to verbalize understanding with possible A/E recommendations and task modifications prn to increase ease, safety, and independence with IADLS Baseline:  Goal status: INITIAL  4.  Pt to perform environmental scanning with simple physical task with 90% accuracy in prep for potential return to driving Baseline:  Goal status: INITIAL   LONG TERM GOALS: Target date: 05/14/23:   Pt to return to light cooking at distant supervision level Baseline:  Goal status: INITIAL  2.  Pt to return to light cleaning tasks at home at mod I level Baseline:  Goal status: INITIAL  3.  Pt to improve grip strength bilaterally by 5 lbs or more to open tight containers/jars w/ greater ease Baseline: Rt = 28 lbs, Lt = 20 lbs Goal status: INITIAL   ASSESSMENT:  CLINICAL IMPRESSION: Patient is a 38 y.o. female who was seen today for occupational therapy evaluation for brain lesion/MS. Hx includes Lupus, complicated recent medical history (see above). Patient currently presents below baseline level of functioning demonstrating functional deficits and impairments as noted below. Pt would benefit from skilled OT services in the outpatient setting to work on impairments as noted below to help pt return to PLOF as able.   Aaron Aas   PERFORMANCE DEFICITS: in functional skills including ADLs, IADLs, coordination, sensation, strength, Fine motor control, mobility, balance, endurance, decreased knowledge of use of DME, and UE functional use, cognitive skills including problem solving and safety awareness, and psychosocial skills including coping strategies.   IMPAIRMENTS: are limiting patient from ADLs, IADLs, and work.   CO-MORBIDITIES: has co-morbidities such as lupus  that affects occupational performance. Patient will benefit from skilled OT to address above impairments and improve overall function.  MODIFICATION OR ASSISTANCE TO COMPLETE EVALUATION: No  modification of tasks or assist necessary to complete an evaluation.  OT OCCUPATIONAL PROFILE AND HISTORY: Detailed assessment: Review of records and additional review of physical, cognitive, psychosocial history related to current functional performance.  CLINICAL DECISION MAKING: Moderate - several treatment options, min-mod task modification necessary  REHAB POTENTIAL: Good  EVALUATION COMPLEXITY: Moderate    PLAN:  OT FREQUENCY: 1-2x/week  OT DURATION: 8 weeks  PLANNED INTERVENTIONS: 97535 self care/ADL training, 16109 therapeutic exercise, 97530 therapeutic activity, 97112 neuromuscular re-education, 97140 manual therapy, 97039 fluidotherapy, passive range of motion, functional mobility training, visual/perceptual remediation/compensation, energy conservation, coping strategies training, patient/family education, and DME and/or AE instructions  RECOMMENDED OTHER SERVICES: none at this time  CONSULTED AND AGREED WITH PLAN OF CARE: Patient  PLAN FOR NEXT SESSION: HEP for high level coordination and bilateral grip strength   Velinda Getting, OT 03/16/2023, 12:12 PM

## 2023-03-16 NOTE — Patient Instructions (Signed)

## 2023-03-17 LAB — CULTURE, FUNGUS WITHOUT SMEAR

## 2023-03-24 ENCOUNTER — Encounter: Payer: Self-pay | Admitting: Occupational Therapy

## 2023-03-24 ENCOUNTER — Encounter: Payer: Self-pay | Admitting: Physical Therapy

## 2023-03-24 ENCOUNTER — Ambulatory Visit: Payer: 59 | Admitting: Physical Therapy

## 2023-03-24 ENCOUNTER — Ambulatory Visit: Payer: 59 | Admitting: Occupational Therapy

## 2023-03-24 DIAGNOSIS — R2681 Unsteadiness on feet: Secondary | ICD-10-CM

## 2023-03-24 DIAGNOSIS — R208 Other disturbances of skin sensation: Secondary | ICD-10-CM

## 2023-03-24 DIAGNOSIS — R278 Other lack of coordination: Secondary | ICD-10-CM

## 2023-03-24 DIAGNOSIS — M6281 Muscle weakness (generalized): Secondary | ICD-10-CM | POA: Diagnosis not present

## 2023-03-24 DIAGNOSIS — R2689 Other abnormalities of gait and mobility: Secondary | ICD-10-CM

## 2023-03-24 NOTE — Therapy (Signed)
 OUTPATIENT PHYSICAL THERAPY NEURO TREATMENT NOTE   Patient Name: Emily Hicks MRN: [id number] DOB:01/29/1986, 38 y.o., female Today's Date: 03/24/2023   PCP: No PCP Per Pt REFERRING PROVIDER: Jerald Kief, MD  END OF SESSION:  PT End of Session - 03/24/23 0916     Visit Number 2    Number of Visits 9    Date for PT Re-Evaluation 05/15/23    Authorization Type [employer]    Authorization - Visit Number 2    Authorization - Number of Visits 20    PT Start Time 0803    PT Stop Time 0845    PT Time Calculation (min) 42 min    Equipment Utilized During Treatment Gait belt    Activity Tolerance Patient tolerated treatment well    Behavior During Therapy WFL for tasks assessed/performed              Past Medical History:  Diagnosis Date   Cervical intraepithelial neoplasia III    Gallstones    GERD (gastroesophageal reflux disease)    only prn OTC occasionally   History of blood transfusion 2020   Lupus dx 2020   Multiple sclerosis (HCC) dx 2020   Numbness and tingling of left leg    Past Surgical History:  Procedure Laterality Date   CERVICAL CONIZATION W/BX N/A 01/16/2020   Procedure: CERVICAL CONE BIOPSY, ENDOCERVICAL CURETTAGE;  Surgeon: Theresia Majors, MD;  Location: [facility] [place];  Service: Gynecology;  Laterality: N/A;  request 7:30am OR start in [place] Gyn block requests 45 minutes   CHOLECYSTECTOMY N/A 12/28/2013   Procedure: LAPAROSCOPIC CHOLECYSTECTOMY;  Surgeon: Claud Kelp, MD;  Location: [facility];  Service: General;  Laterality: N/A;   MUSCLE BIOPSY Right 11/06/2022   Procedure: RIGHT THIGH MUSCLE BIOPSY;  Surgeon: Andria Meuse, MD;  Location: [facility];  Service: General;  Laterality: Right;   Patient Active Problem List   Diagnosis Date Noted   Rhabdomyolysis 11/05/2022   AKI (acute kidney injury) (HCC) 11/04/2022   Recurrent idiopathic pericarditis 08/04/2022   Complicated UTI (urinary tract infection) 06/16/2022   Chest pain  06/13/2021   Sepsis (HCC) 06/13/2021   Rash and nonspecific skin eruption 05/21/2020   CIN III (cervical intraepithelial neoplasia grade III) with severe dysplasia 02/09/2020   HSIL (high grade squamous intraepithelial lesion) on Pap smear of cervix 11/03/2019   Cervical cancer screening 09/30/2019   Essential hypertension 09/19/2018   SIRS (systemic inflammatory response syndrome) (HCC) 08/16/2018   SLE (systemic lupus erythematosus related syndrome) (HCC) 08/16/2018   Pancytopenia (HCC)    Anxiety and depression    Elevated liver enzymes 08/02/2018   Normocytic anemia    Tachycardia 07/16/2018   Multiple sclerosis (HCC) 03/31/2018   Common migraine 08/26/2017   Chondromalacia, patella, left 06/24/2017   Healthcare maintenance 12/20/2015   Contraceptive management 10/11/2014   Hidradenitis suppurativa of left axilla 06/07/2013   Dysplasia of cervix, low grade (CIN 1) 07/03/2011   Obesity, Class I, BMI 30.0-34.9 (see actual BMI) 05/14/2011    ONSET DATE: 02-23-23  REFERRING DIAG:  Diagnosis  G93.9 (ICD-10-CM) - Brain lesion  G35 (ICD-10-CM) - Multiple sclerosis (HCC)    THERAPY DIAG:  Muscle weakness (generalized)  Unsteadiness on feet  Other abnormalities of gait and mobility  Rationale for Evaluation and Treatment: Rehabilitation  SUBJECTIVE:  SUBJECTIVE STATEMENT:   Pt reports her left knee is sore today and feels tight; says if felt pretty good yesterday and she did a lot of walking in her home without use of rollator.  Has been doing the exercises in her HEP and reports no questions or problems with them  Pt reports she has been rollator since discharge from hospital on 03-03-23  - doesn't usually use in the home; pt has tingling on Lt side of body - has had since 2020; pt reports  balance has not been good since October, when she was hospitalized in Oct. 2024;  pt reports her low back feels very tight.  Says Lt side is more impaired than her Rt side due to MS  Pt accompanied by: self  PERTINENT HISTORY: Diagnosed with MS and Lupus in 2020:  per chart note  "38 y.o. female with past medical history significant for multiple sclerosis, lupus, GERD, cervical intraepithelial neoplasia, cholelithiasis who presented to [facility] ED Pt  on 1/28 with 2-week history of progressive generalized weakness, malaise, headache. Also endorsed nasal congestion, nausea, nosebleeds. Currently on Plaquenil and azathioprine for her lupus. Followed by neurology outpatient, Dr. Epimenio Foot but currently not on any additional disease modifying therapy for her MS currently. Sister also endorses that she has been having a hard time "gripping" items. She has been taking Excedrin without much relief regarding her headaches. Denies any urinary symptoms, no visual changes, no fever, no chills, no rigors, no muscle aches."  Brain lesion, migraines, rhabdomyolysis, tachycardia  PAIN: Pt reports no true pain - pt reports Lt knee is sore and tight today, but reports no true pain (03-24-23) Are you having pain?  "Uncomfortable in my lower body area"  PRECAUTIONS: Fall  RED FLAGS: None   WEIGHT BEARING RESTRICTIONS: No  FALLS: Has patient fallen in last 6 months? Yes. Number of falls 1- inside her home - occurred late September  LIVING ENVIRONMENT: Lives with: lives with their family Lives in: House/apartment Stairs: Yes: External: 2 steps; on left going up Has following equipment at home: Walker - 4 wheeled, shower chair, and bed side commode  PLOF: Independent - pt has returned to work (accounting) - is currently remotely from home but plans to return to office when able: is not driving currently   PATIENT GOALS: wants to improve balance and increase LE strength  OBJECTIVE:  Note: Objective  measures were completed at Evaluation unless otherwise noted.  DIAGNOSTIC FINDINGS: MRI brain with and without contrast with multifocal regions of cortical diffusion restriction associated with contrast-enhancement bilateral insular regions, right occipital lobe, left cerebellum.  Imaging findings concerning for inflammatory vs CNS vasculitis vs CVA vs lupus cerebritis vs tumefactive MS vs infection.  CT angiogram head/neck with no large vessel occlusion, no stenosis or other acute vascular abnormality, no evidence for vasculitis, multiple prominent cervical, axillary, mediastinal lymph nodes, few scattered pulmonary nodules.  MR T/L-spine with T2 hyperintense foci cervical spine cord C4-5, C6/7 consistent with known MS, no abnormal enhancement to suggest active demyelination, no abnormal spinal cord signal or enhancement in the thoracic spine, epidural fluid collection thoracic spine likely from recent LP.  Underwent lumbar puncture 02/24/2023 by IR with initial fluid analysis not consistent with infection; meningitis/encephalitis panel negative.  EEG with generalized and maximal right parieto-occipital slowing suggestive of cortical dysfunction likely secondary to underlying structural normality, no seizure/epileptiform discharges noted.  COGNITION: Overall cognitive status: Within functional limits for tasks assessed   SENSATION: Pt reports numbness & tingling  in LE's - reports this was occurring prior to recent hospitalization  COORDINATION: WFL's    POSTURE: rounded shoulders and forward head  LOWER EXTREMITY ROM:   WFL's bil. LE's    LOWER EXTREMITY MMT:    MMT Right Eval Left Eval  Hip flexion 4 3+  Hip extension 3- 2+  Hip abduction    Hip adduction    Hip internal rotation    Hip external rotation    Knee flexion 3+ 3  Knee extension 4 4  Ankle dorsiflexion 4 4  Ankle plantarflexion    Ankle inversion    Ankle eversion    (Blank rows = not tested)  BED MOBILITY:   Modified independent  TRANSFERS: Assistive device utilized: None  Sit to stand: Modified independence Stand to sit: Modified independence   STAIRS:  TBA   GAIT: Gait pattern: step through pattern Distance walked: 10' Assistive device utilized: Environmental consultant - 4 wheeled Level of assistance: Modified independence Comments: no device for TUG  FUNCTIONAL TESTS:  5 times sit to stand: 38.09 secs without UE support from mat Timed up and go (TUG): 29.69 secs without device 10 meter walk test: 18.72 secs = 1.75 ft/sec with rollator Pt stood for 1" unsupported with SBA                                                                                                                             TREATMENT DATE: 03-24-23   NeuroRe-ed:    [facility] PT Assessment - 03/24/23 0001       Standardized Balance Assessment   Standardized Balance Assessment Berg Balance Test      Berg Balance Test   Sit to Stand Able to stand without using hands and stabilize independently    Standing Unsupported Able to stand 2 minutes with supervision    Sitting with Back Unsupported but Feet Supported on Floor or Stool Able to sit safely and securely 2 minutes    Stand to Sit Sits safely with minimal use of hands    Transfers Able to transfer safely, minor use of hands    Standing Unsupported with Eyes Closed Able to stand 10 seconds with supervision    Standing Unsupported with Feet Together Able to place feet together independently and stand for 1 minute with supervision    From Standing, Reach Forward with Outstretched Arm Can reach forward >12 cm safely (5")    From Standing Position, Pick up Object from Floor Able to pick up shoe, needs supervision   5-6   From Standing Position, Turn to Look Behind Over each Shoulder Turn sideways only but maintains balance    Turn 360 Degrees Needs close supervision or verbal cueing   R= 9.07  L = 8.4   Standing Unsupported, Alternately Place Feet on Step/Stool Able to complete  >2 steps/needs minimal assist    Standing Unsupported, One Foot in Front Able to take small step independently and hold 30 seconds  Standing on One Leg Tries to lift leg/unable to hold 3 seconds but remains standing independently    Total Score 38    Berg comment: Lt knee is sore/tight today impacting balance             Performed tap ups to 1st step - 5 reps RLE with UE support to minimize Lt knee pain; with LLE with reduced UE support 5 reps      TherEx:  Pt performed hip and core strengthening exercises;  bridging with LE extension 5 reps each leg, bridging with marching 5 reps each leg and bridging with hip abduction/adduction 5 reps  SLR LLE 10 reps (MMT grade 4-/5);  pt made circles CW 3 reps with LLE and then leg fatigued - unable to do CCW direction of circle with LLE due to difficulty and due to fatigue  Lt hip abduction in Rt sidelying position 10 reps  Lt hamstring stretching in seated position 30 sec hold x 1 rep  Lt gastroc stretching - with heels off step 15 sec hold x 1 rep  Updated HEP; to include SLR, hip abdct., hamstring and heel cord stretching (added these exs. To same Medbridge HEP)  HEP - for bil. LE strengthening  Access Code: [email or web address] URL: [email or web address] Date: 03/16/2023 Prepared by: Maebelle Munroe  Exercises - Supine Bridge  - 1 x daily - 7 x weekly - 1 sets - 10 reps - 5 sec hold - Clamshell  - 1 x daily - 7 x weekly - 1 sets - 10 reps - 3-5 sec hold - Sit to Stand  - 1 x daily - 7 x weekly - 1 sets - 10 reps - Standing Hip Extension with Counter Support  - 1 x daily - 7 x weekly - 3 sets - 10 reps - Seated Hamstring Curl with Anchored Resistance  - 1 x daily - 7 x weekly - 1 sets - 10 reps - Hooklying Single Knee to Chest  - 1 x daily - 7 x weekly - 1 sets - 10 reps - Single Knee to Chest Stretch  - 1 x daily - 7 x weekly - 1 sets - 2 reps - 15 sec hold - Seated Knee Extension with Resistance  - 1 x daily - 7 x weekly - 1  sets - 10 reps - 5 sec hold hold  PATIENT EDUCATION: Education details: HEP for bil. LE strengthening Person educated: Patient Education method: Explanation, Demonstration, and Handouts Education comprehension: verbalized understanding and returned demonstration  HOME EXERCISE PROGRAM: Medbridge [email or web address]  GOALS: Goals reviewed with patient? Yes  SHORT TERM GOALS: Target date: 04-17-23   Pt will increase Berg balance test score by at least 5 points to demo improved balance and reduced fall risk. Baseline: Goal status: INITIAL  2.  Improve TUG score to </= 24 secs without use of device to demo improved mobility and reduced fall risk. Baseline: 29.69 secs without device Goal status: INITIAL  3.  Improve 5x sit to stand score to </= 30 secs without UE support from high/low mat table. Baseline: 38.09 secs without UE support Goal status: INITIAL  4.  Increase gait velocity to >/= 2.2 ft/sec with rollator for increased gait efficiency. Baseline: 18.72 secs = 1.75 ft/sec with rollator Goal status: INITIAL  5.  Independent in HEP for bil. LE strengthening. Baseline:  Goal status: INITIAL  6.  Pt will ambulate 115' without device with SBA on flat, even surface.  Baseline:  Goal status: INITIAL  LONG TERM GOALS: Target date: 05-15-23  Pt will increase Berg balance test score by at least 10 points to demo improved balance and reduced fall risk. Baseline:  Goal status: INITIAL  2.   Improve TUG score to </= 19 secs without use of device to demo improved mobility and reduced fall risk. Baseline: 29.69 secs without device Goal status: INITIAL  3.   Improve 5x sit to stand score to </= 30 secs without UE support from high/low mat table. Baseline: 38.09 secs without UE support Goal status: INITIAL  4.   Increase gait velocity to >/= 2.6 ft/sec with rollator for increased gait efficiency. Baseline: 18.72 secs = 1.75 ft/sec with rollator Goal status: INITIAL  5.  Pt will  ambulate 350' without device with SBA on flat, even surface.  Baseline:  Goal status: INITIAL  6.  Independent in updated HEP for bil. LE strengthening. Baseline:  Goal status: INITIAL  ASSESSMENT:  CLINICAL IMPRESSION: PT session focused on assessment of balance with pt scoring 38/56 on Berg test, indicative of fall risk as score < 45/56.  Pt did report increased soreness and tightness in Lt knee today compared to that in initial eval last week, with pt unable to tolerate LLE SLS.  Remainder of session focused on LLE strengthening and stretching with pt demonstrating increased strength in LLE in today's session with pt able to perform SLR and able to perform circles with LLE in CW direction for 3 reps prior to fatigue.  Pt is progressing well.  Cont with POC.   OBJECTIVE IMPAIRMENTS: Abnormal gait, decreased balance, decreased endurance, decreased strength, and impaired tone.   ACTIVITY LIMITATIONS: carrying, lifting, bending, standing, squatting, stairs, transfers, and locomotion level  PARTICIPATION LIMITATIONS: meal prep, cleaning, laundry, driving, shopping, community activity, and occupation  PERSONAL FACTORS: Time since onset of injury/illness/exacerbation and 1-2 comorbidities: MS and Lupus  are also affecting patient's functional outcome.   REHAB POTENTIAL: Good  CLINICAL DECISION MAKING: Evolving/moderate complexity  EVALUATION COMPLEXITY: Moderate  PLAN:  PT FREQUENCY: 1-2x/week  PT DURATION: 8 weeks  PLANNED INTERVENTIONS: 97110-Therapeutic exercises, 97530- Therapeutic activity, [id number]- Neuromuscular re-education, [phone]- Self Care, [id number]- Gait training, [phone]- Aquatic Therapy, Patient/Family education, Balance training, and Stair training  PLAN FOR NEXT SESSION: check updated exercises in HEP - cont with gait and balance training    Aveya Beal, Donavan Burnet, PT 03/24/2023, 2:20 PM

## 2023-03-24 NOTE — Therapy (Signed)
 OUTPATIENT OCCUPATIONAL THERAPY NEURO TREATMENT  Patient Name: Emily Hicks MRN: [id number] DOB:1985/08/08, 38 y.o., female Today's Date: 03/24/2023  PCP: None yet.  (Dr. Epimenio Foot neurologist) REFERRING PROVIDER: Jerald Kief, MD  END OF SESSION:  OT End of Session - 03/24/23 0851     Visit Number 2    Number of Visits 9    Date for OT Re-Evaluation 05/14/23    Authorization Type [employer], VL: 20 each discipline, no auth required    OT Start Time 0848    OT Stop Time 0930    OT Time Calculation (min) 42 min    Activity Tolerance Patient tolerated treatment well    Behavior During Therapy WFL for tasks assessed/performed;Flat affect             Past Medical History:  Diagnosis Date   Cervical intraepithelial neoplasia III    Gallstones    GERD (gastroesophageal reflux disease)    only prn OTC occasionally   History of blood transfusion 2020   Lupus dx 2020   Multiple sclerosis (HCC) dx 2020   Numbness and tingling of left leg    Past Surgical History:  Procedure Laterality Date   CERVICAL CONIZATION W/BX N/A 01/16/2020   Procedure: CERVICAL CONE BIOPSY, ENDOCERVICAL CURETTAGE;  Surgeon: Theresia Majors, MD;  Location: [facility];  Service: Gynecology;  Laterality: N/A;  request 7:30am OR start in [place] Gyn block requests 45 minutes   CHOLECYSTECTOMY N/A 12/28/2013   Procedure: LAPAROSCOPIC CHOLECYSTECTOMY;  Surgeon: Claud Kelp, MD;  Location: [facility];  Service: General;  Laterality: N/A;   MUSCLE BIOPSY Right 11/06/2022   Procedure: RIGHT THIGH MUSCLE BIOPSY;  Surgeon: Andria Meuse, MD;  Location: [facility];  Service: General;  Laterality: Right;   Patient Active Problem List   Diagnosis Date Noted   Rhabdomyolysis 11/05/2022   AKI (acute kidney injury) (HCC) 11/04/2022   Recurrent idiopathic pericarditis 08/04/2022   Complicated UTI (urinary tract infection) 06/16/2022   Chest pain 06/13/2021   Sepsis (HCC) 06/13/2021   Rash and  nonspecific skin eruption 05/21/2020   CIN III (cervical intraepithelial neoplasia grade III) with severe dysplasia 02/09/2020   HSIL (high grade squamous intraepithelial lesion) on Pap smear of cervix 11/03/2019   Cervical cancer screening 09/30/2019   Essential hypertension 09/19/2018   SIRS (systemic inflammatory response syndrome) (HCC) 08/16/2018   SLE (systemic lupus erythematosus related syndrome) (HCC) 08/16/2018   Pancytopenia (HCC)    Anxiety and depression    Elevated liver enzymes 08/02/2018   Normocytic anemia    Tachycardia 07/16/2018   Multiple sclerosis (HCC) 03/31/2018   Common migraine 08/26/2017   Chondromalacia, patella, left 06/24/2017   Healthcare maintenance 12/20/2015   Contraceptive management 10/11/2014   Hidradenitis suppurativa of left axilla 06/07/2013   Dysplasia of cervix, low grade (CIN 1) 07/03/2011   Obesity, Class I, BMI 30.0-34.9 (see actual BMI) 05/14/2011    ONSET DATE: 03/02/2023 (referral date)   REFERRING DIAG: G93.9 (ICD-10-CM) - Brain lesion G35 (ICD-10-CM) - Multiple sclerosis (HCC)  THERAPY DIAG:  Muscle weakness (generalized)  Other lack of coordination  Unsteadiness on feet  Other disturbances of skin sensation  Rationale for Evaluation and Treatment: Rehabilitation  SUBJECTIVE:   SUBJECTIVE STATEMENT: No new changes Pt accompanied by: self  PERTINENT HISTORY: Hospital ED on 1/20 with 2-week history of progressive generalized weakness, malaise, headache. Transferred to [facility] 1/20 for neurological work up. MRI reveals Multifocal regions of predominantly cortical diffusion restriction with associated contrast enhancement, involving the  bilateral insular regions, right occipital lobe, and left cerebellum. EEG suggestive cortical dysfunction arising from right parieto-occipital likely secondary to underlying structural abnormality. 1/21 underwent LP  PMH: multiple sclerosis (diagnosed 2020), lupus (diagnosed 2020), GERD, cervical  intraepithelial neoplasia, cholelithiasis   Imaging findings concerning for inflammatory vs CNS vasculitis vs CVA vs lupus cerebritis vs tumefactive MS vs infection  PRECAUTIONS: Fall  WEIGHT BEARING RESTRICTIONS: No  PAIN:  Are you having pain?  Pt reports she is uncomfortable in lower back and Lt knee  FALLS: Has patient fallen in last 6 months? Yes. 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  HAND DOMINANCE: Right  ADLs: Transfers/ambulation related to ADLs: using rollator currently but only needs with exacerbations Eating: mod I  Grooming: mod I (fatigues with styling hair)  UB Dressing: slower but mod I  LB Dressing: slower but mod I  Toileting: mod I - difficulty w/ lower toilets Bathing: mod I with shower chair Tub Shower transfers: mod I  Equipment: Shower seat with back and 1 small grab bar  Pt back to work full time and reports typing is doing fine  IADLs: Shopping: when pt is doing well, pt does. Husband has been doing since last October Light housekeeping: pt does light cleaning normally but hasn't since October Meal Prep: light cooking but not since October Community mobility: pt currently not driving since January hospitalization Medication management: mod I w/ pillbox (pt loads)  Financial management: mod I  Handwriting: 100% legible and Mild micrographia (normal for her)  MOBILITY STATUS:  using rollator currently since last exacerbation   ACTIVITY TOLERANCE: Activity tolerance: fatigues easily (worse since October)   UPPER EXTREMITY ROM:  BUE AROM WFL's  (but more difficulty in high flexion)  UPPER EXTREMITY MMT:   RUE MMT 4/5, LUE MMT 3+/5 grossly   HAND FUNCTION: Grip strength: Right: 28  lbs; Left: 20 lbs  COORDINATION: 9 Hole Peg test: Right: 30.79 sec; Left: 32.55 sec (? Mild apraxia)  SENSATION: Pins and needles sensation entire Lt side  EDEMA: none in Ue's but some in both feet/ankles  MUSCLE TONE: not tested  COGNITION: Overall cognitive status:  slower processing   VISION: Subjective report: Pt reports vision deficits has resolved - pt initially had visual deficits including Lt visual field loss and sensitivity to light Baseline vision: Wears glasses all the time Visual history:  none  VISION ASSESSMENT: Not tested   PERCEPTION: Not tested  PRAXIS: Not tested  OBSERVATIONS: Pt has not returned to IADLS since October exacerbation. However, pt has returned to work w/ no problems reported.  Overall fatigue and muscle weakness  TREATMENT DATE: 03/24/23  Pt issued putty HEP and high level coordination HEP for bilateral hands - see pt instructions for details. Pt return demo of each w/ cues. Pt demo some decreased motor planning/processing with certain coordination ex's  Discussed safety considerations w/ altered sensation including: looking at hands when using them, checking temperature of water (for showers, washing dishes, etc) with part of the body that's intact, using travel mug to transport coffee, using chopper or cut resistant gloves,etc. To give handout next session   PATIENT EDUCATION: Education details: coordination and putty HEP  Person educated: Patient Education method: Programmer, multimedia, Demonstration, Verbal cues, and Handouts Education comprehension: verbalized understanding, returned demonstration, verbal cues required, and needs further education  HOME EXERCISE PROGRAM: 03/24/23:  coordination and putty HEP    GOALS: Goals reviewed with patient? Yes  SHORT TERM GOALS: Target date: 04/13/23  Independent with HEP for UE strength, grip strength, and high level coordination Baseline: Goal status: INITIAL  2.  Pt to verbalize understanding with energy conservation techniques Baseline:  Goal status: IN PROGRESS  3.  Pt to verbalize understanding with possible A/E recommendations and task modifications prn to increase ease, safety, and independence with IADLS Baseline:  Goal status: INITIAL  4.  Pt to perform environmental scanning with simple physical task with 90% accuracy in prep for potential return to driving Baseline:  Goal status: INITIAL   LONG TERM GOALS: Target date: 05/14/23:   Pt to return to light cooking at distant supervision level Baseline:  Goal status: INITIAL  2.  Pt to return to light cleaning tasks at home at mod I level Baseline:  Goal status: INITIAL  3.  Pt to improve grip strength bilaterally by 5 lbs or more to open tight containers/jars w/ greater ease Baseline: Rt = 28 lbs, Lt = 20 lbs Goal status: INITIAL   ASSESSMENT:  CLINICAL IMPRESSION: Patient seen today for occupational therapy treatment for brain lesion/MS. Hx includes Lupus, complicated recent medical history (see above). Focus today on establishing HEP and safety considerations. Patient currently presents below baseline level of functioning demonstrating functional deficits and impairments as noted below. Pt would benefit from skilled OT services in the outpatient setting to work on impairments as noted below to help pt return to PLOF as able.   Marland Kitchen   PERFORMANCE DEFICITS: in functional skills including ADLs, IADLs, coordination, sensation, strength, Fine motor control, mobility, balance, endurance, decreased knowledge of use of DME, and UE functional use, cognitive skills including problem solving and safety awareness, and psychosocial skills including coping strategies.    IMPAIRMENTS: are limiting patient from ADLs, IADLs, and work.   CO-MORBIDITIES: has co-morbidities such as lupus  that affects occupational performance. Patient will benefit from skilled OT to address above impairments and improve overall function.  MODIFICATION OR ASSISTANCE TO COMPLETE EVALUATION: No modification of tasks or assist necessary to complete an evaluation.  OT OCCUPATIONAL PROFILE AND HISTORY: Detailed assessment: Review of records and additional review of physical, cognitive, psychosocial history related to current functional performance.  CLINICAL DECISION MAKING: Moderate - several treatment options, min-mod task modification necessary  REHAB POTENTIAL: Good  EVALUATION COMPLEXITY: Moderate    PLAN:  OT FREQUENCY: 1-2x/week  OT DURATION: 8 weeks  PLANNED INTERVENTIONS: 97535 self care/ADL training, 16109 therapeutic exercise, 97530 therapeutic activity, 97112 neuromuscular re-education, 97140 manual therapy, 97039 fluidotherapy, passive range of motion, functional mobility training, visual/perceptual remediation/compensation, energy conservation, coping strategies training, patient/family education, and DME and/or AE instructions  RECOMMENDED OTHER SERVICES: none at this  time  CONSULTED AND AGREED WITH PLAN OF CARE: Patient  PLAN FOR NEXT SESSION: sensory education, practice carrying objects w/ attention to hands and balance, progress towards STG's   Sheran Lawless, OT 03/24/2023, 8:52 AM

## 2023-03-24 NOTE — Patient Instructions (Signed)
 1. Grip Strengthening (Resistive Putty)   Squeeze putty using thumb and all fingers. Repeat _20___ times. Do __2__ sessions per day.   2. Roll putty into tube on table and pinch between first two fingers and thumb x 10 reps. Do 2 sessions per day   Coordination Activities  Perform the following activities for 10 minutes 1-2 times per day with both hand(s).  Rotate ball in fingertips (clockwise and counter-clockwise). Deal cards with your thumb (Hold deck in hand and push card off top with thumb). Rotate one card in hand (clockwise and counter-clockwise). Pick up coins one at a time until you get 5 in your hand, then move coins from palm to fingertips to stack one at a time.     Copyright  [employer]. All rights reserved.

## 2023-03-25 LAB — FUNGUS CULTURE RESULT

## 2023-03-25 LAB — FUNGUS CULTURE WITH STAIN

## 2023-03-25 LAB — FUNGAL ORGANISM REFLEX

## 2023-03-30 ENCOUNTER — Ambulatory Visit: Payer: 59 | Admitting: Neurology

## 2023-03-30 ENCOUNTER — Encounter: Payer: Self-pay | Admitting: Neurology

## 2023-03-30 VITALS — BP 142/88 | HR 102 | Ht 61.0 in | Wt 189.0 lb

## 2023-03-30 DIAGNOSIS — R269 Unspecified abnormalities of gait and mobility: Secondary | ICD-10-CM

## 2023-03-30 DIAGNOSIS — I776 Arteritis, unspecified: Secondary | ICD-10-CM

## 2023-03-30 DIAGNOSIS — G35 Multiple sclerosis: Secondary | ICD-10-CM

## 2023-03-30 DIAGNOSIS — N319 Neuromuscular dysfunction of bladder, unspecified: Secondary | ICD-10-CM

## 2023-03-30 DIAGNOSIS — R29898 Other symptoms and signs involving the musculoskeletal system: Secondary | ICD-10-CM

## 2023-03-30 DIAGNOSIS — Z79899 Other long term (current) drug therapy: Secondary | ICD-10-CM

## 2023-03-30 DIAGNOSIS — M329 Systemic lupus erythematosus, unspecified: Secondary | ICD-10-CM | POA: Diagnosis not present

## 2023-03-30 DIAGNOSIS — G35D Multiple sclerosis, unspecified: Secondary | ICD-10-CM

## 2023-03-30 MED ORDER — SOLIFENACIN SUCCINATE 10 MG PO TABS: 10.0000 mg | ORAL_TABLET | Freq: Every day | ORAL | 11 refills | Status: AC

## 2023-03-30 NOTE — Progress Notes (Addendum)
 [facility]  PATIENT: Emily Hicks DOB: 07/31/1985  REFERRING DOCTOR OR PCP:  Dr. Constance Hicks (PCP) SOURCE: patient, lab reports, MRI reports, multiple MRI images personally reviewed.  _________________________________   HISTORICAL  CHIEF COMPLAINT:  Chief Complaint  Patient presents with   Follow-up    Pt in 10 alone Pt here for m/s f/u  Pt states wants to review CT head and MRI brain . Pt states received this test during her hospital stay Pt wants to discuss medications for MS     HISTORY OF PRESENT ILLNESS:  She is a 38 year old woman who was diagnosed with MS in early 2020 but has had symptoms since 2010.   She also has SLE.  Update 03/30/2023: She was hospitalized in January 2025 after presenting with a couple weeks of generalized weakness, malaise, headache and reduced coordination in the hands.  She also had some brain fog.   MRI of the brain showed multifocal regions of cortical/subcortical restricted diffusion and increased T2/flair signal.  Some of the foci had subtle enhancement.  This was concerning for CNS vasculitis (has SLE) though demyelination would also be in the differential.  She is currently on Imuran 150 mg daily, hydroxychloroquine 200 mg twice a day and prednisone 60 mg daily.    She was on a similar regimen (less prednisone)  before the hospitalization.     Not currently on a biologic but was on  .  Saphnelo , an IFN-1 receptor antagonist IV in past.      She is doing much better though she still feels balance is off.   She is doing PT now.    She has an appt to see Dr. Deanne Hicks next week.       She went to the hospital 11/06/2022.   She was felt to have very elevated CK (high of 60454) and had a muscle biopsy (just showed general inflammation).  She was placed on prednisone 50 mg po daily.  Dr Emily Hicks felt the myositis was due to her SLE     Around May 2024, she had a flare up of numbness, bladder issues and leg strength.  Gait is worse.   Her  bladder issues developed with this flare-up.   This is mostly urgency though she has considerable hesitancy, and double voids/incomplete empying.   I saw her in June 2024.  Back in July 2024, we had rechecked MRI brain and spune and she had no new MS lesions.      She sees Dr. Deanne Hicks [facility]) for her SLE.    MS Clinical course:   Around 2010, she had numbness/tingling in her left thigh and cramps in her toes.   While pregnant a little time later, she improved.   After her sone was born, the left leg leg symptoms returned.   She saw Dr. Terrace Hicks and an MRI of the brain showed a few scattered foci but was mostly nonspecific.   She had more intense numbness in the left leg but also numbness in the left face and a repeat 2016 brain MRI showed a few more lesions.    Numbness symptoms did not improve but early 2020 she had more intense numbness and an MRI of the brain and cervical spine was performed.   She had 3-4 lesions in her cervical spine and the brian showed no definite less lesions.   An LP ws then performed and showed > 5 oligoclonal bands.   MS was diagnosed.  She started Aubagio and took only one week before having a hospitalization for fever, hypernatremia, joint inflammation, thrush) and it was stopped.    First seen by Dr. Epimenio Hicks 09/08/2018.     MR cervical spine 08/05/2022 showed  This MRI of the cervical spine with and without contrast shows the following: T2 hyperintense foci noted in the brainstem and adjacent to C1-C2, C2, C4-C5 and C6-C7 as detailed above.  None of the foci enhanced.  Compared to the MRI from 05/16/2020, there do not appear to be any new lesions. No disc degenerative changes.  No spinal stenosis or nerve root compression. Normal enhancement pattern.   MRI thoracic spine 08/05/2022 showed normal spinal cord  MRI brain 08/05/2022 showed Small number of T2/FLAIR hypertense foci in the cerebral hemispheres and 1 focus in the right upper pons in a pattern consistent  with chronic demyelinating plaque associated with multiple sclerosis. None of the foci appear to be acute. Compared to previous MRIs, there were no new lesions.    MRI brain 05/16/2020 was unchanged compared to 2021  MRI cervical spine 05/16/2020 showed  no definite new lesions (one possible new lesion had been seen on a prior MRI). "There is a subtle T2 hyperintense focus posterolaterally to the right adjacent to C2. There is a focus toward the left adjacent to C4-C5 also noted on the previous MRIs. These were present, and actually more apparent, on the MRI from 05/24/2019. There is a subtle focus towards the right adjacent to C6-C7 seen more prominently on previous MRIs. There is a small focus towards the left C5-C6 seen on the sagittal images today but not noted on the4/20/2021 MRI. However, the focus does appear to be subtle on the 03/24/2018 MRI.. "    MRI brain 05/24/19 shows foci in the hemispheres and in the right cerebral peduncle consistent with MS.  No changes compared to 07/27/2018.  Normal enhancement pattern. MRI cervical spine 05/24/19 shows T2 hyperintense foci to the right adjacent to C2, to the left adjacent to C4-C5, towards the left adjacent to C5 and towards the right adjacent to C6-C7.  No change compared to the 2020 MRI. MRI brain 07/27/18: Formal report was Normal - does show a few small T2/Flair lesions, 2 periventricular and the rest deep white matter MRI cervical spine 03/24/18 shows 3-4 lesions (C4 left, C5 left and C7 right also C2 on sagittal only) c/w demyelinating plaque. MRI brain 03/25/18:  Some scattered T2/Flair foci in hemispheres 1-2 perventricular MRI lumbar 08/02/18:  Normal  11/15/2014: By the report:   6 periventricular and subcortical foci - 1 new focus in left posterior temporal white matter and 2 punctate left frontal lobe lesions compared to 2011.   Other lesions stable.  I also see a right cerebral peduncle lesion that may explain her symptoms   MS and other Lab  Studies:  CSF showed > 5 OCB and increased IgG index (1.57) 04/16/2018.   Positive ANA with increased dsDNA, ENARNP, ENA SM, SSA.   ESR = 27, Vit D low (15-17)  Other medical problems: She has SLE and is on Plaquenil (Dr. Deanne Hicks [phone])  She was hospitalized for SLE, hypernatremia, elevated LFTs and thrush 07/27/18,   Of note LFTs were normal 09/08/2018.   She did take Aubagio x 1 week before she was hospitalized.     REVIEW OF SYSTEMS: Constitutional: No fevers, chills, sweats, or change in appetite Eyes: No visual changes, double vision, eye pain Ear, nose and throat: No hearing loss,  ear pain, nasal congestion, sore throat Cardiovascular: No chest pain, palpitations Respiratory:  No shortness of breath at rest or with exertion.   No wheezes GastrointestinaI: No nausea, vomiting, diarrhea, abdominal pain, fecal incontinence Genitourinary:  No dysuria, urinary retention or frequency.  No nocturia. Musculoskeletal:  No neck pain, back pain Integumentary: No rash, pruritus, skin lesions Neurological: as above Psychiatric: No depression at this time.  No anxiety Endocrine: No palpitations, diaphoresis, change in appetite, change in weigh or increased thirst Hematologic/Lymphatic:  No anemia, purpura, petechiae. Allergic/Immunologic: No itchy/runny eyes, nasal congestion, recent allergic reactions, rashes  ALLERGIES: No Known Allergies  HOME MEDICATIONS:  Current Outpatient Medications:    aspirin-acetaminophen-caffeine (EXCEDRIN MIGRAINE) 250-250-65 MG tablet, Take 2 tablets by mouth every 6 (six) hours as needed for headache or migraine., Disp: , Rfl:    azathioprine (IMURAN) 100 MG tablet, Take 150 mg by mouth daily., Disp: , Rfl:    clobetasol ointment (TEMOVATE) 0.05 %, Apply 1 application  topically daily as needed (rash)., Disp: , Rfl:    hydroxychloroquine (PLAQUENIL) 200 MG tablet, Take 200 mg by mouth 2 (two) times daily., Disp: , Rfl:    ketoconazole (NIZORAL) 2 % shampoo,  Apply 1 Application topically once a week., Disp: , Rfl:    metoprolol succinate (TOPROL-XL) 50 MG 24 hr tablet, Take 1 tablet (50 mg total) by mouth at bedtime. Take with or immediately following a meal., Disp: 30 tablet, Rfl: 0   predniSONE (DELTASONE) 20 MG tablet, Take 3 tablets (60 mg total) by mouth daily with breakfast., Disp: 90 tablet, Rfl: 0   solifenacin (VESICARE) 10 MG tablet, Take 1 tablet (10 mg total) by mouth daily., Disp: 30 tablet, Rfl: 11   TYLENOL 325 MG CAPS, Take 650 mg by mouth every 6 (six) hours as needed (for headaches or pain)., Disp: , Rfl:    Vitamin D, Ergocalciferol, (DRISDOL) 1.25 MG (50000 UNIT) CAPS capsule, Take 1 capsule (50,000 Units total) by mouth every 7 (seven) days., Disp: 12 capsule, Rfl: 1   gabapentin (NEURONTIN) 300 MG capsule, Take 1 capsule (300 mg total) by mouth 3 (three) times daily. (Patient taking differently: Take 300 mg by mouth daily as needed (nerve pain).), Disp: 90 capsule, Rfl: 11   mirtazapine (REMERON SOL-TAB) 15 MG disintegrating tablet, Take 0.5 tablets (7.5 mg total) by mouth at bedtime as needed (sleep if patient prefers over [place])., Disp: 15 tablet, Rfl: 0   sulfamethoxazole-trimethoprim (BACTRIM DS) 800-160 MG tablet, Take 1 tablet by mouth 3 (three) times a week., Disp: 12 tablet, Rfl: 0  Current Facility-Administered Medications:    medroxyPROGESTERone (DEPO-PROVERA) injection 150 mg, 150 mg, Intramuscular, Q90 days, McDiarmid, Leighton Roach, MD, 150 mg at 03/18/21 1435  PAST MEDICAL HISTORY: Past Medical History:  Diagnosis Date   Cervical intraepithelial neoplasia III    Gallstones    GERD (gastroesophageal reflux disease)    only prn OTC occasionally   History of blood transfusion 2020   Lupus dx 2020   Multiple sclerosis (HCC) dx 2020   Numbness and tingling of left leg     PAST SURGICAL HISTORY: Past Surgical History:  Procedure Laterality Date   CERVICAL CONIZATION W/BX N/A 01/16/2020   Procedure: CERVICAL CONE  BIOPSY, ENDOCERVICAL CURETTAGE;  Surgeon: Theresia Majors, MD;  Location: [facility];  Service: Gynecology;  Laterality: N/A;  request 7:30am OR start in [place] Gyn block requests 45 minutes   CHOLECYSTECTOMY N/A 12/28/2013   Procedure: LAPAROSCOPIC CHOLECYSTECTOMY;  Surgeon: Claud Kelp, MD;  Location: [facility];  Service: General;  Laterality: N/A;   MUSCLE BIOPSY Right 11/06/2022   Procedure: RIGHT THIGH MUSCLE BIOPSY;  Surgeon: Andria Meuse, MD;  Location: [facility];  Service: General;  Laterality: Right;    FAMILY HISTORY: Family History  Problem Relation Age of Onset   Hypertension Mother    Hypertension Father    Hypertension Sister    Diabetes Maternal Grandmother    Diabetes Maternal Grandfather    Multiple sclerosis Neg Hx     SOCIAL HISTORY:  Social History   Socioeconomic History   Marital status: Married    Spouse name: Not on file   Number of children: 1   Years of education: 16   Highest education level: Bachelor's degree (e.g., BA, AB, BS)  Occupational History   Occupation: Accounting  Tobacco Use   Smoking status: Never   Smokeless tobacco: Never  Vaping Use   Vaping status: Never Used  Substance and Sexual Activity   Alcohol use: No   Drug use: No   Sexual activity: Yes    Birth control/protection: None    Comment: 1st intercourse 38 yo-Fewer than 5 partners  Other Topics Concern   Not on file  Social History Narrative   Lives at home with husband and son.   Right-handed   Drinks some Tea   Pt works    Museum/gallery exhibitions officer: Not on file  Food Insecurity: No Food Insecurity (02/23/2023)   Hunger Vital Sign    Worried About Running Out of Food in the Last Year: Never true    Ran Out of Food in the Last Year: Never true  Transportation Needs: No Transportation Needs (02/23/2023)   PRAPARE - Administrator, Civil Service (Medical): No    Lack of Transportation (Non-Medical): No   Physical Activity: Not on file  Stress: Not on file  Social Connections: Not on file  Intimate Partner Violence: Not At Risk (02/23/2023)   Humiliation, Afraid, Rape, and Kick questionnaire    Fear of Current or Ex-Partner: No    Emotionally Abused: No    Physically Abused: No    Sexually Abused: No     PHYSICAL EXAM  Vitals:   03/30/23 1140  BP: (!) 142/88  Pulse: (!) 102  Weight: 189 lb (85.7 kg)  Height: 5\' 1"  (1.549 m)      Body mass index is 35.71 kg/m.   General: The patient is well-developed and well-nourished and in no acute distress   Skin: Extremities are without rash or  edema.  Musculoskeletal: She has tenderness with palpation of the proximal leg and shoulder/chest muscles   Neurologic Exam  Mental status: The patient is alert and oriented x 3 at the time of the examination. The patient has apparent normal recent and remote memory, with an apparently normal attention span and concentration ability.   Speech is normal.  Cranial nerves: Extraocular movements are full.  Facial strength and sensation is normal..  Trapezius and sternocleidomastoid strength is normal. No dysarthria is noted.   No obvious hearing deficits are noted.  Motor:  Muscle bulk is normal.   Tone is normal. Strength is  4+/5 iliopsoas muscles but o/w 5/5 in legs  Sensory: Sensory testing shows reduced sensation to touch and vibration on right arm and leg.    Coordination: Cerebellar testing reveals good finger-nose-finger and now normal heel-to-shin bilaterally.  Gait and station: Station is normal - needs one arm to  safely rise from chair.  Can walk without the walker in room.  Wide stance and reduced stride and 4 step 180 degree turn.    Cannot tandem.  Romberg is borderline.  Reflexes: Deep tendon reflexes are symmetric and normal in arms and 3+ at knees.    No ankle clonus     DIAGNOSTIC DATA (LABS, IMAGING, TESTING) - I reviewed patient records, labs, notes, testing and  imaging myself where available.  Lab Results  Component Value Date   WBC 5.6 03/03/2023   HGB 9.2 (L) 03/03/2023   HCT 30.0 (L) 03/03/2023   MCV 88.5 03/03/2023   PLT 248 03/03/2023      Component Value Date/Time   NA 140 03/03/2023 0522   NA 146 (H) 12/11/2022 0909   K 4.3 03/03/2023 0522   CL 113 (H) 03/03/2023 0522   CO2 19 (L) 03/03/2023 0522   GLUCOSE 119 (H) 03/03/2023 0522   BUN 41 (H) 03/03/2023 0522   BUN 12 12/11/2022 0909   CREATININE 1.23 (H) 03/03/2023 0522   CREATININE 0.71 10/29/2010 0934   CALCIUM 7.7 (L) 03/03/2023 0522   PROT 5.6 (L) 03/03/2023 0522   PROT 6.0 12/11/2022 0909   ALBUMIN 2.3 (L) 03/03/2023 0522   ALBUMIN 3.4 (L) 02/24/2023 1130   ALBUMIN 4.0 04/16/2018 1225   AST 10 (L) 03/03/2023 0522   ALT 7 03/03/2023 0522   ALKPHOS 33 (L) 03/03/2023 0522   BILITOT 0.4 03/03/2023 0522   BILITOT 0.3 12/11/2022 0909   GFRNONAA 58 (L) 03/03/2023 0522   GFRAA 127 09/29/2019 1035   Lab Results  Component Value Date   CHOL 126 08/15/2021   HDL 49 08/15/2021   LDLCALC 59 08/15/2021   TRIG 93 08/15/2021   CHOLHDL 2.6 08/15/2021   Lab Results  Component Value Date   HGBA1C 5.5 02/24/2023   Lab Results  Component Value Date   VITAMINB12 309 11/05/2022   Lab Results  Component Value Date   TSH 1.466 02/24/2023       ASSESSMENT AND PLAN  1. Multiple sclerosis (HCC)   2. CNS vasculitis (HCC)   3. Gait disturbance   4. SLE (systemic lupus erythematosus related syndrome) (HCC)   5. Bladder dysfunction   6. High risk medication use   7. Weakness of both lower extremities       1.   She had a very abnormal brain MRI with multiple cortical/subcortical large foci with mild enhancement.  The appearance is worrisome for CNS vasculitis or infectious process.  The appearance would not be typical for MS. As she has improved, infection or malignancy is unlikely.  She continues on her medications for lupus including azathioprine, hydroxychloroquine and  prednisone.  With her recent hospitalization, would consider changing to an anti-CD20 agent which might have benefit for both MS and CNS vasculitis.   We will recheck an MRI in about 1 month (2 months after her last MRI) I will send a copy of this note to rheumatologist - Dr. Deanne Hicks at [facility] if changes)  2.   Stay active and exercise as tolerated. 3.   Vit D supps - 5000 units daily 4.   She will return to see me in 6 months but sooner based on a treatment decision.  Ronnell Clinger A. Emily Foot, MD, [facility] 03/30/2023, 2:20 PM Certified in Neurology, Clinical Neurophysiology, Sleep Medicine and Neuroimaging  [facility] [address], [address] [place], [place] [address] [phone]

## 2023-03-31 ENCOUNTER — Encounter: Payer: Self-pay | Admitting: Neurology

## 2023-03-31 ENCOUNTER — Ambulatory Visit: Payer: 59 | Admitting: Occupational Therapy

## 2023-03-31 ENCOUNTER — Ambulatory Visit: Payer: 59 | Admitting: Physical Therapy

## 2023-03-31 ENCOUNTER — Encounter: Payer: Self-pay | Admitting: Occupational Therapy

## 2023-03-31 DIAGNOSIS — M6281 Muscle weakness (generalized): Secondary | ICD-10-CM | POA: Diagnosis not present

## 2023-03-31 DIAGNOSIS — R278 Other lack of coordination: Secondary | ICD-10-CM

## 2023-03-31 DIAGNOSIS — R2689 Other abnormalities of gait and mobility: Secondary | ICD-10-CM

## 2023-03-31 DIAGNOSIS — R2681 Unsteadiness on feet: Secondary | ICD-10-CM

## 2023-03-31 DIAGNOSIS — R29818 Other symptoms and signs involving the nervous system: Secondary | ICD-10-CM

## 2023-03-31 DIAGNOSIS — R208 Other disturbances of skin sensation: Secondary | ICD-10-CM

## 2023-03-31 LAB — CBC WITH DIFFERENTIAL/PLATELET
Basophils Absolute: 0 10*3/uL (ref 0.0–0.2)
Basos: 0 %
EOS (ABSOLUTE): 0 10*3/uL (ref 0.0–0.4)
Eos: 0 %
Hematocrit: 36 % (ref 34.0–46.6)
Hemoglobin: 11.3 g/dL (ref 11.1–15.9)
Lymphocytes Absolute: 0.9 10*3/uL (ref 0.7–3.1)
Lymphs: 18 %
MCH: 28.8 pg (ref 26.6–33.0)
MCHC: 31.4 g/dL — ABNORMAL LOW (ref 31.5–35.7)
MCV: 92 fL (ref 79–97)
Monocytes Absolute: 0.2 10*3/uL (ref 0.1–0.9)
Monocytes: 4 %
Neutrophils Absolute: 3.8 10*3/uL (ref 1.4–7.0)
Neutrophils: 74 %
Platelets: 316 10*3/uL (ref 150–450)
RBC: 3.93 x10E6/uL (ref 3.77–5.28)
RDW: 16 % — ABNORMAL HIGH (ref 11.7–15.4)
WBC: 5.2 10*3/uL (ref 3.4–10.8)

## 2023-03-31 LAB — COMPREHENSIVE METABOLIC PANEL
ALT: 12 IU/L (ref 0–32)
AST: 12 IU/L (ref 0–40)
Albumin: 3.8 g/dL — ABNORMAL LOW (ref 3.9–4.9)
Alkaline Phosphatase: 69 IU/L (ref 44–121)
BUN/Creatinine Ratio: 33 — ABNORMAL HIGH (ref 9–23)
BUN: 23 mg/dL — ABNORMAL HIGH (ref 6–20)
Bilirubin Total: 0.5 mg/dL (ref 0.0–1.2)
CO2: 23 mmol/L (ref 20–29)
Calcium: 9.4 mg/dL (ref 8.7–10.2)
Chloride: 105 mmol/L (ref 96–106)
Creatinine, Ser: 0.7 mg/dL (ref 0.57–1.00)
Globulin, Total: 2.2 g/dL (ref 1.5–4.5)
Glucose: 68 mg/dL — ABNORMAL LOW (ref 70–99)
Potassium: 4.2 mmol/L (ref 3.5–5.2)
Sodium: 142 mmol/L (ref 134–144)
Total Protein: 6 g/dL (ref 6.0–8.5)
eGFR: 115 mL/min/{1.73_m2} (ref >59)

## 2023-03-31 LAB — IMMATURE CELLS
MYELOCYTES: 2 % — ABNORMAL HIGH (ref 0–0)
Metamyelocytes: 2 % — ABNORMAL HIGH (ref 0–0)

## 2023-03-31 NOTE — Patient Instructions (Signed)
   Safety considerations for loss of sensation:   Look at affected hand when using it!   Do NOT use affected arm for anything: sharp, hot, breakable, or too heavy  Always check temperature of water (for showering, washing dishes, etc) with UNaffected arm/extremity  Consider travel mugs w/ lids to transport hot liquids/coffee  Consider alternative options and/or adaptive equipment to make things safer (ex: hand chopper or cut resistant glove for chopping vegetables, long silicone oven mitts, oven rack pull)   Avoid cold temperatures as well (wear glove in cold temperatures, use scooper to get ice)  AVOID handling chemicals and machinery

## 2023-03-31 NOTE — Therapy (Signed)
 OUTPATIENT OCCUPATIONAL THERAPY NEURO TREATMENT  Patient Name: Emily Hicks MRN: [id number] DOB:Sep 22, 1985, 38 y.o., female Today's Date: 03/31/2023  PCP: None yet.  (Dr. Epimenio Foot neurologist) REFERRING PROVIDER: Jerald Kief, MD  END OF SESSION:  OT End of Session - 03/31/23 0848     Visit Number 3    Number of Visits 9    Date for OT Re-Evaluation 05/14/23    Authorization Type [employer], VL: 20 each discipline, no auth required    OT Start Time 0847    OT Stop Time 0930    OT Time Calculation (min) 43 min    Activity Tolerance Patient tolerated treatment well    Behavior During Therapy WFL for tasks assessed/performed;Flat affect             Past Medical History:  Diagnosis Date   Cervical intraepithelial neoplasia III    Gallstones    GERD (gastroesophageal reflux disease)    only prn OTC occasionally   History of blood transfusion 2020   Lupus dx 2020   Multiple sclerosis (HCC) dx 2020   Numbness and tingling of left leg    Past Surgical History:  Procedure Laterality Date   CERVICAL CONIZATION W/BX N/A 01/16/2020   Procedure: CERVICAL CONE BIOPSY, ENDOCERVICAL CURETTAGE;  Surgeon: Theresia Majors, MD;  Location: [facility];  Service: Gynecology;  Laterality: N/A;  request 7:30am OR start in [place] [facility]yn block requests 45 minutes   CHOLECYSTECTOMY N/A 12/28/2013   Procedure: LAPAROSCOPIC CHOLECYSTECTOMY;  Surgeon: Claud Kelp, MD;  Location: [facility];  Service: General;  Laterality: N/A;   MUSCLE BIOPSY Right 11/06/2022   Procedure: RIGHT THIGH MUSCLE BIOPSY;  Surgeon: Andria Meuse, MD;  Location: [facility];  Service: General;  Laterality: Right;   Patient Active Problem List   Diagnosis Date Noted   Rhabdomyolysis 11/05/2022   AKI (acute kidney injury) (HCC) 11/04/2022   Recurrent idiopathic pericarditis 08/04/2022   Complicated UTI (urinary tract infection) 06/16/2022   Chest pain 06/13/2021   Sepsis (HCC) 06/13/2021   Rash and  nonspecific skin eruption 05/21/2020   CIN III (cervical intraepithelial neoplasia grade III) with severe dysplasia 02/09/2020   HSIL (high grade squamous intraepithelial lesion) on Pap smear of cervix 11/03/2019   Cervical cancer screening 09/30/2019   Essential hypertension 09/19/2018   SIRS (systemic inflammatory response syndrome) (HCC) 08/16/2018   SLE (systemic lupus erythematosus related syndrome) (HCC) 08/16/2018   Pancytopenia (HCC)    Anxiety and depression    Elevated liver enzymes 08/02/2018   Normocytic anemia    Tachycardia 07/16/2018   Multiple sclerosis (HCC) 03/31/2018   Common migraine 08/26/2017   Chondromalacia, patella, left 06/24/2017   Healthcare maintenance 12/20/2015   Contraceptive management 10/11/2014   Hidradenitis suppurativa of left axilla 06/07/2013   Dysplasia of cervix, low grade (CIN 1) 07/03/2011   Obesity, Class I, BMI 30.0-34.9 (see actual BMI) 05/14/2011    ONSET DATE: 03/02/2023 (referral date)   REFERRING DIAG: G93.9 (ICD-10-CM) - Brain lesion G35 (ICD-10-CM) - Multiple sclerosis (HCC)  THERAPY DIAG:  Muscle weakness (generalized)  Other lack of coordination  Unsteadiness on feet  Other disturbances of skin sensation  Other symptoms and signs involving the nervous system  Rationale for Evaluation and Treatment: Rehabilitation  SUBJECTIVE:   SUBJECTIVE STATEMENT: No new changes. Lt knee still bothers me - 3/10 when up Pt accompanied by: self  PERTINENT HISTORY: Hospital ED on 1/20 with 2-week history of progressive generalized weakness, malaise, headache. Transferred to [facility] 1/20 for  neurological work up. MRI reveals Multifocal regions of predominantly cortical diffusion restriction with associated contrast enhancement, involving the bilateral insular regions, right occipital lobe, and left cerebellum. EEG suggestive cortical dysfunction arising from right parieto-occipital likely secondary to underlying structural abnormality. 1/21  underwent LP  PMH: multiple sclerosis (diagnosed 2020), lupus (diagnosed 2020), GERD, cervical intraepithelial neoplasia, cholelithiasis   Imaging findings concerning for inflammatory vs CNS vasculitis vs CVA vs lupus cerebritis vs tumefactive MS vs infection  PRECAUTIONS: Fall  WEIGHT BEARING RESTRICTIONS: No  PAIN:  Are you having pain?  Pt reports she is uncomfortable in lower back and Lt knee  FALLS: Has patient fallen in last 6 months? Yes. 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  HAND DOMINANCE: Right  ADLs: Transfers/ambulation related to ADLs: using rollator currently but only needs with exacerbations Eating: mod I  Grooming: mod I (fatigues with styling hair)  UB Dressing: slower but mod I  LB Dressing: slower but mod I  Toileting: mod I - difficulty w/ lower toilets Bathing: mod I with shower chair Tub Shower transfers: mod I  Equipment: Shower seat with back and 1 small grab bar  Pt back to work full time and reports typing is doing fine  IADLs: Shopping: when pt is doing well, pt does. Husband has been doing since last October Light housekeeping: pt does light cleaning normally but hasn't since October Meal Prep: light cooking but not since October Community mobility: pt currently not driving since January hospitalization Medication management: mod I w/ pillbox (pt loads)  Financial management: mod I  Handwriting: 100% legible and Mild micrographia (normal for her)  MOBILITY STATUS:  using rollator currently since last exacerbation   ACTIVITY  TOLERANCE: Activity tolerance: fatigues easily (worse since October)   UPPER EXTREMITY ROM:  BUE AROM WFL's (but more difficulty in high flexion)  UPPER EXTREMITY MMT:   RUE MMT 4/5, LUE MMT 3+/5 grossly   HAND FUNCTION: Grip strength: Right: 28  lbs; Left: 20 lbs  COORDINATION: 9 Hole Peg test: Right: 30.79 sec; Left: 32.55 sec (? Mild apraxia)  SENSATION: Pins and needles sensation entire Lt side  EDEMA: none in Ue's but some in both feet/ankles  MUSCLE TONE: not tested  COGNITION: Overall cognitive status:  slower processing   VISION: Subjective report: Pt reports vision deficits has resolved - pt initially had visual deficits including Lt visual field loss and sensitivity to light Baseline vision: Wears glasses all the time Visual history:  none  VISION ASSESSMENT: Not tested   PERCEPTION: Not tested  PRAXIS: Not tested  OBSERVATIONS: Pt has not returned to IADLS since October exacerbation. However, pt has returned to work w/ no problems reported.  Overall fatigue and muscle weakness  TREATMENT DATE: 03/31/23  Reviewed safety considerations w/ altered sensation including: looking at hands when using them, checking temperature of water (for showers, washing dishes, etc) with part of the body that's intact, using travel mug to transport coffee, using chopper or cut resistant gloves,etc. And provided handout today. Pt also issued handouts on recommended A/E: Finger guard or cut resistant gloves, silicone oven mitts, oven rack pull. Pt already has veggie chopper.   Practiced carrying cup 1/2 full of water with Lt hand, then Rt hand while ambulating (gait belt on). Progressed to holding cup in one hand and plastic plate in other hand w/ ambulation.   Pt copying small peg design Lt hand for [facility] and visual perceptual skills. Pt copying design at 100%  accuracy. Then removing pegs Rt hand by storing several in hand and bringing back to fingertips one at a time for in hand manipulation.     PATIENT EDUCATION: Education details: Safety/sensory education Person educated: Patient Education method: Programmer, multimedia, Verbal cues, and Handouts Education comprehension: verbalized understanding and verbal cues required  HOME EXERCISE PROGRAM: 03/24/23: coordination and putty HEP  03/31/23: sensory education   GOALS: Goals reviewed with patient? Yes  SHORT TERM GOALS: Target date: 04/13/23  Independent with HEP for UE strength, grip strength, and high level coordination Baseline: Goal status: IN PROGRESS  2.  Pt to verbalize understanding with energy conservation techniques Baseline:  Goal status: MET  3.  Pt to verbalize understanding with possible A/E recommendations and task modifications prn to increase ease, safety, and independence with IADLS Baseline:  Goal status: MET   4.  Pt to perform environmental scanning with simple physical task with 90% accuracy in prep for potential return to driving Baseline:  Goal status: INITIAL   LONG TERM GOALS: Target date: 05/14/23:   Pt to return to light cooking at distant supervision level Baseline:  Goal status: INITIAL  2.  Pt to return to light cleaning tasks at home at mod I level Baseline:  Goal status: INITIAL  3.  Pt to improve grip strength bilaterally by 5 lbs or more to open tight containers/jars w/ greater ease Baseline: Rt = 28 lbs, Lt = 20 lbs Goal status: INITIAL   ASSESSMENT:  CLINICAL IMPRESSION: Patient seen today for occupational therapy treatment for brain lesion/MS. Hx includes Lupus, complicated recent medical history (see above). Pt has met 2 STG's at this time and progressing towards remaining goals. Patient currently presents below baseline level of functioning demonstrating functional deficits and impairments as noted below. Pt would benefit from skilled OT  services in the outpatient setting to work on impairments as noted below to help pt return to PLOF as able.   Marland Kitchen   PERFORMANCE DEFICITS: in functional skills including ADLs, IADLs, coordination, sensation, strength, Fine motor control, mobility, balance, endurance, decreased knowledge of use of DME, and UE functional use, cognitive skills including problem solving and safety awareness, and psychosocial skills including coping strategies.   IMPAIRMENTS: are limiting patient from ADLs, IADLs, and work.   CO-MORBIDITIES: has co-morbidities such as lupus  that affects occupational performance. Patient will benefit from skilled OT to address above impairments and improve overall function.  MODIFICATION OR ASSISTANCE TO COMPLETE EVALUATION: No modification of tasks or assist necessary to complete an evaluation.  OT OCCUPATIONAL PROFILE AND HISTORY: Detailed assessment: Review of records and additional review of physical, cognitive, psychosocial history related to current functional performance.  CLINICAL DECISION MAKING: Moderate - several treatment options, min-mod task modification necessary  REHAB  POTENTIAL: Good  EVALUATION COMPLEXITY: Moderate    PLAN:  OT FREQUENCY: 1-2x/week  OT DURATION: 8 weeks  PLANNED INTERVENTIONS: 97535 self care/ADL training, 45409 therapeutic exercise, 97530 therapeutic activity, 97112 neuromuscular re-education, 97140 manual therapy, 97039 fluidotherapy, passive range of motion, functional mobility training, visual/perceptual remediation/compensation, energy conservation, coping strategies training, patient/family education, and DME and/or AE instructions  RECOMMENDED OTHER SERVICES: none at this time  CONSULTED AND AGREED WITH PLAN OF CARE: Patient  PLAN FOR NEXT SESSION: theraband HEP, gripper activity   Sheran Lawless, OT 03/31/2023, 8:49 AM

## 2023-03-31 NOTE — Therapy (Unsigned)
 OUTPATIENT PHYSICAL THERAPY NEURO TREATMENT NOTE   Patient Name: Emily Hicks MRN: [id number] DOB:March 25, 1985, 38 y.o., female Today's Date: 04/01/2023   PCP: No PCP Per Pt REFERRING PROVIDER: Jerald Kief, MD  END OF SESSION:  PT End of Session - 04/01/23 1031     Visit Number 3    Number of Visits 9    Date for PT Re-Evaluation 05/15/23    Authorization Type [employer]    Authorization - Visit Number 3    Authorization - Number of Visits 20    PT Start Time 0804    PT Stop Time 0845    PT Time Calculation (min) 41 min    Equipment Utilized During Treatment Gait belt    Activity Tolerance Patient tolerated treatment well    Behavior During Therapy WFL for tasks assessed/performed               Past Medical History:  Diagnosis Date   Cervical intraepithelial neoplasia III    Gallstones    GERD (gastroesophageal reflux disease)    only prn OTC occasionally   History of blood transfusion 2020   Lupus dx 2020   Multiple sclerosis (HCC) dx 2020   Numbness and tingling of left leg    Past Surgical History:  Procedure Laterality Date   CERVICAL CONIZATION W/BX N/A 01/16/2020   Procedure: CERVICAL CONE BIOPSY, ENDOCERVICAL CURETTAGE;  Surgeon: Theresia Majors, MD;  Location: [facility] [place];  Service: Gynecology;  Laterality: N/A;  request 7:30am OR start in [place] Gyn block requests 45 minutes   CHOLECYSTECTOMY N/A 12/28/2013   Procedure: LAPAROSCOPIC CHOLECYSTECTOMY;  Surgeon: Claud Kelp, MD;  Location: [facility];  Service: General;  Laterality: N/A;   MUSCLE BIOPSY Right 11/06/2022   Procedure: RIGHT THIGH MUSCLE BIOPSY;  Surgeon: Andria Meuse, MD;  Location: [facility];  Service: General;  Laterality: Right;   Patient Active Problem List   Diagnosis Date Noted   Rhabdomyolysis 11/05/2022   AKI (acute kidney injury) (HCC) 11/04/2022   Recurrent idiopathic pericarditis 08/04/2022   Complicated UTI (urinary tract infection) 06/16/2022   Chest  pain 06/13/2021   Sepsis (HCC) 06/13/2021   Rash and nonspecific skin eruption 05/21/2020   CIN III (cervical intraepithelial neoplasia grade III) with severe dysplasia 02/09/2020   HSIL (high grade squamous intraepithelial lesion) on Pap smear of cervix 11/03/2019   Cervical cancer screening 09/30/2019   Essential hypertension 09/19/2018   SIRS (systemic inflammatory response syndrome) (HCC) 08/16/2018   SLE (systemic lupus erythematosus related syndrome) (HCC) 08/16/2018   Pancytopenia (HCC)    Anxiety and depression    Elevated liver enzymes 08/02/2018   Normocytic anemia    Tachycardia 07/16/2018   Multiple sclerosis (HCC) 03/31/2018   Common migraine 08/26/2017   Chondromalacia, patella, left 06/24/2017   Healthcare maintenance 12/20/2015   Contraceptive management 10/11/2014   Hidradenitis suppurativa of left axilla 06/07/2013   Dysplasia of cervix, low grade (CIN 1) 07/03/2011   Obesity, Class I, BMI 30.0-34.9 (see actual BMI) 05/14/2011    ONSET DATE: 02-23-23  REFERRING DIAG:  Diagnosis  G93.9 (ICD-10-CM) - Brain lesion  G35 (ICD-10-CM) - Multiple sclerosis (HCC)    THERAPY DIAG:  Muscle weakness (generalized)  Other abnormalities of gait and mobility  Unsteadiness on feet  Rationale for Evaluation and Treatment: Rehabilitation  SUBJECTIVE:  SUBJECTIVE STATEMENT:   Pt reports her left knee is not as sore today as it was last week; states she is slowy but steadily improving and getting stronger  Pt reports she has been rollator since discharge from hospital on 03-03-23  - doesn't usually use in the home; pt has tingling on Lt side of body - has had since 2020; pt reports balance has not been good since October, when she was hospitalized in Oct. 2024;  pt reports her low back feels  very tight.  Says Lt side is more impaired than her Rt side due to MS  Pt accompanied by: self  PERTINENT HISTORY: Diagnosed with MS and Lupus in 2020:  per chart note  "38 y.o. female with past medical history significant for multiple sclerosis, lupus, GERD, cervical intraepithelial neoplasia, cholelithiasis who presented to [facility] ED Pt  on 1/28 with 2-week history of progressive generalized weakness, malaise, headache. Also endorsed nasal congestion, nausea, nosebleeds. Currently on Plaquenil and azathioprine for her lupus. Followed by neurology outpatient, Dr. Epimenio Foot but currently not on any additional disease modifying therapy for her MS currently. Sister also endorses that she has been having a hard time "gripping" items. She has been taking Excedrin without much relief regarding her headaches. Denies any urinary symptoms, no visual changes, no fever, no chills, no rigors, no muscle aches."  Brain lesion, migraines, rhabdomyolysis, tachycardia  PAIN: Pt reports no true pain - pt reports Lt knee is sore and tight today, but reports no true pain (03-31-23) 3/10  Are you having pain?  "Uncomfortable in my lower body area"  PRECAUTIONS: Fall  RED FLAGS: None   WEIGHT BEARING RESTRICTIONS: No  FALLS: Has patient fallen in last 6 months? Yes. Number of falls 1- inside her home - occurred late September  LIVING ENVIRONMENT: Lives with: lives with their family Lives in: House/apartment Stairs: Yes: External: 2 steps; on left going up Has following equipment at home: Walker - 4 wheeled, shower chair, and bed side commode  PLOF: Independent - pt has returned to work (accounting) - is currently remotely from home but plans to return to office when able: is not driving currently   PATIENT GOALS: wants to improve balance and increase LE strength  OBJECTIVE:  Note: Objective measures were completed at Evaluation unless otherwise noted.  DIAGNOSTIC FINDINGS: MRI brain with and  without contrast with multifocal regions of cortical diffusion restriction associated with contrast-enhancement bilateral insular regions, right occipital lobe, left cerebellum.  Imaging findings concerning for inflammatory vs CNS vasculitis vs CVA vs lupus cerebritis vs tumefactive MS vs infection.  CT angiogram head/neck with no large vessel occlusion, no stenosis or other acute vascular abnormality, no evidence for vasculitis, multiple prominent cervical, axillary, mediastinal lymph nodes, few scattered pulmonary nodules.  MR T/L-spine with T2 hyperintense foci cervical spine cord C4-5, C6/7 consistent with known MS, no abnormal enhancement to suggest active demyelination, no abnormal spinal cord signal or enhancement in the thoracic spine, epidural fluid collection thoracic spine likely from recent LP.  Underwent lumbar puncture 02/24/2023 by IR with initial fluid analysis not consistent with infection; meningitis/encephalitis panel negative.  EEG with generalized and maximal right parieto-occipital slowing suggestive of cortical dysfunction likely secondary to underlying structural normality, no seizure/epileptiform discharges noted.  COGNITION: Overall cognitive status: Within functional limits for tasks assessed   SENSATION: Pt reports numbness & tingling in LE's - reports this was occurring prior to recent hospitalization  COORDINATION: WFL's    POSTURE: rounded shoulders and  forward head  LOWER EXTREMITY ROM:   WFL's bil. LE's    LOWER EXTREMITY MMT:    MMT Right Eval Left Eval  Hip flexion 4 3+  Hip extension 3- 2+  Hip abduction    Hip adduction    Hip internal rotation    Hip external rotation    Knee flexion 3+ 3  Knee extension 4 4  Ankle dorsiflexion 4 4  Ankle plantarflexion    Ankle inversion    Ankle eversion    (Blank rows = not tested)  BED MOBILITY:  Modified independent  TRANSFERS: Assistive device utilized: None  Sit to stand: Modified  independence Stand to sit: Modified independence   STAIRS:  TBA   GAIT: Gait pattern: step through pattern Distance walked: 55' Assistive device utilized: Environmental consultant - 4 wheeled Level of assistance: Modified independence Comments: no device for TUG  FUNCTIONAL TESTS:  5 times sit to stand: 38.09 secs without UE support from mat Timed up and go (TUG): 29.69 secs without device 10 meter walk test: 18.72 secs = 1.75 ft/sec with rollator Pt stood for 1" unsupported with SBA                                                                                                                             TREATMENT DATE: 03-31-23   TherEx:   Sit to stand 5 reps without UE support from mat table - min. Difficulty noted with this exercise  SLR LLE and RLE 10 reps (no weight) Standing bil. Heel raise 10 reps for gastroc strengthening Lt and Rt hip abduction in sidelying positions 10 reps  Lt hamstring stretching in seated position 30 sec hold x 1 rep  Lt gastroc stretching - with heels off step 15 sec hold x 1 rep  Prone position - Lt knee flexion 10 reps with no weight with cues for controlled descent; 10 reps with 2# weight Prone position - Lt hip extension with knee flexed at 90 degrees and foot flexed - 10 reps with min assist to maintain Lt knee in flexed position  Tall kneeling position - pt performed bil. Shoulder flexion to 90 degrees for core stabilization; mini squats (small range due to Lt knee discomfort) 10 reps  Birddog pose - 3 reps on each side- with min assist for stability - attempted 5 sec hold  TherAct:  Step up exercise LLE onto 6" step 10 reps with bil. UE support on rails for quad strengthening  Performed tap ups to 1st step - 5 reps RLE with UE support to minimize Lt knee pain; with LLE with reduced UE support 5 reps  Amb. Inside // bars - 10' x 2 forwards on tiptoes for gastroc strengthening Attempted to perform unilateral heel raise on each LE but pt unable to do due  to gastroc weakness  Pt performed stepping over and back of black balance beam inside // bars with UE support prn 10 reps each leg for  closed chain strengthening and improved SLS on each leg   Gait;  pt gait trained 230' without device with CGA - pt noted to look downward most of the time  Step training - pt negotiated 4 steps x 2 reps using bil. Hand rails and step over step sequence - SBA   Medbridge HEP update:  added bil. Heel raise to HEP and plantarflexion with theraband in seated position to current Medbridge HEP --[email or web address]  Updated HEP; to include SLR, hip abdct., hamstring and heel cord stretching (added these exs. To same Medbridge HEP)  HEP - for bil. LE strengthening  Access Code: [email or web address] URL: [email or web address] Date: 03/16/2023 Prepared by: Maebelle Munroe  Exercises - Supine Bridge  - 1 x daily - 7 x weekly - 1 sets - 10 reps - 5 sec hold - Clamshell  - 1 x daily - 7 x weekly - 1 sets - 10 reps - 3-5 sec hold - Sit to Stand  - 1 x daily - 7 x weekly - 1 sets - 10 reps - Standing Hip Extension with Counter Support  - 1 x daily - 7 x weekly - 3 sets - 10 reps - Seated Hamstring Curl with Anchored Resistance  - 1 x daily - 7 x weekly - 1 sets - 10 reps - Hooklying Single Knee to Chest  - 1 x daily - 7 x weekly - 1 sets - 10 reps - Single Knee to Chest Stretch  - 1 x daily - 7 x weekly - 1 sets - 2 reps - 15 sec hold - Seated Knee Extension with Resistance  - 1 x daily - 7 x weekly - 1 sets - 10 reps - 5 sec hold hold  PATIENT EDUCATION: Education details: HEP for bil. LE strengthening Person educated: Patient Education method: Explanation, Demonstration, and Handouts Education comprehension: verbalized understanding and returned demonstration  HOME EXERCISE PROGRAM: Medbridge [email or web address]  GOALS: Goals reviewed with patient? Yes  SHORT TERM GOALS: Target date: 04-17-23   Pt will increase Berg balance test score by at least 5 points to demo improved  balance and reduced fall risk. Baseline: Goal status: INITIAL  2.  Improve TUG score to </= 24 secs without use of device to demo improved mobility and reduced fall risk. Baseline: 29.69 secs without device Goal status: INITIAL  3.  Improve 5x sit to stand score to </= 30 secs without UE support from high/low mat table. Baseline: 38.09 secs without UE support Goal status: INITIAL  4.  Increase gait velocity to >/= 2.2 ft/sec with rollator for increased gait efficiency. Baseline: 18.72 secs = 1.75 ft/sec with rollator Goal status: INITIAL  5.  Independent in HEP for bil. LE strengthening. Baseline:  Goal status: INITIAL  6.  Pt will ambulate 115' without device with SBA on flat, even surface.  Baseline:  Goal status: INITIAL   LONG TERM GOALS: Target date: 05-15-23  Pt will increase Berg balance test score by at least 10 points to demo improved balance and reduced fall risk. Baseline:  Goal status: INITIAL  2.   Improve TUG score to </= 19 secs without use of device to demo improved mobility and reduced fall risk. Baseline: 29.69 secs without device Goal status: INITIAL  3.   Improve 5x sit to stand score to </= 30 secs without UE support from high/low mat table. Baseline: 38.09 secs without UE support Goal status: INITIAL  4.   Increase gait velocity to >/= 2.6 ft/sec with rollator for  increased gait efficiency. Baseline: 18.72 secs = 1.75 ft/sec with rollator Goal status: INITIAL  5.  Pt will ambulate 350' without device with SBA on flat, even surface.  Baseline:  Goal status: INITIAL  6.  Independent in updated HEP for bil. LE strengthening. Baseline:  Goal status: INITIAL  ASSESSMENT:  CLINICAL IMPRESSION: PT session focused on gait training without device and core and bil. LE strengthening.  Pt unable to perform unilateral heel raise on either leg with RLE weaker than LLE due to gastroc weakness with increased range achieved on LLE than RLE.  Pt's Rt noted to  intermittently hyperextend with pt amb. On tiptoes inside // bars, due to hamstring and gastroc weakness.  Added plantarflexor strengthening exercises to HEP.  Pt is progressing well overall.  Cont with POC.   OBJECTIVE IMPAIRMENTS: Abnormal gait, decreased balance, decreased endurance, decreased strength, and impaired tone.   ACTIVITY LIMITATIONS: carrying, lifting, bending, standing, squatting, stairs, transfers, and locomotion level  PARTICIPATION LIMITATIONS: meal prep, cleaning, laundry, driving, shopping, community activity, and occupation  PERSONAL FACTORS: Time since onset of injury/illness/exacerbation and 1-2 comorbidities: MS and Lupus  are also affecting patient's functional outcome.   REHAB POTENTIAL: Good  CLINICAL DECISION MAKING: Evolving/moderate complexity  EVALUATION COMPLEXITY: Moderate  PLAN:  PT FREQUENCY: 1-2x/week  PT DURATION: 8 weeks  PLANNED INTERVENTIONS: 97110-Therapeutic exercises, 97530- Therapeutic activity, [id number]- Neuromuscular re-education, [id number]- Self Care, [id number]- Gait training, [phone]- Aquatic Therapy, Patient/Family education, Balance training, and Stair training  PLAN FOR NEXT SESSION: cont with gait and balance training - do knee extension control exercise with theraband   Rohith Fauth Suzanne, PT 04/01/2023, 10:33 AM

## 2023-04-01 ENCOUNTER — Encounter: Payer: Self-pay | Admitting: Physical Therapy

## 2023-04-07 ENCOUNTER — Ambulatory Visit: Payer: 59 | Attending: Internal Medicine | Admitting: Occupational Therapy

## 2023-04-07 ENCOUNTER — Encounter: Payer: Self-pay | Admitting: Occupational Therapy

## 2023-04-07 ENCOUNTER — Ambulatory Visit: Payer: 59 | Admitting: Physical Therapy

## 2023-04-07 DIAGNOSIS — M6281 Muscle weakness (generalized): Secondary | ICD-10-CM | POA: Diagnosis present

## 2023-04-07 DIAGNOSIS — R278 Other lack of coordination: Secondary | ICD-10-CM | POA: Insufficient documentation

## 2023-04-07 DIAGNOSIS — R2681 Unsteadiness on feet: Secondary | ICD-10-CM | POA: Insufficient documentation

## 2023-04-07 DIAGNOSIS — R208 Other disturbances of skin sensation: Secondary | ICD-10-CM | POA: Diagnosis present

## 2023-04-07 DIAGNOSIS — R2689 Other abnormalities of gait and mobility: Secondary | ICD-10-CM

## 2023-04-07 NOTE — Therapy (Unsigned)
 OUTPATIENT PHYSICAL THERAPY NEURO TREATMENT NOTE   Patient Name: Emily Hicks MRN: [id number] DOB:Jan 10, 1986, 38 y.o., female Today's Date: 04/08/2023   PCP: No PCP Per Pt REFERRING PROVIDER: Jerald Kief, MD  END OF SESSION:  PT End of Session - 04/08/23 1437     Visit Number 4    Number of Visits 9    Date for PT Re-Evaluation 05/15/23    Authorization Type [employer]    Authorization - Number of Visits 20    PT Start Time 0801    PT Stop Time 0845    PT Time Calculation (min) 44 min    Equipment Utilized During Treatment Gait belt    Activity Tolerance Patient tolerated treatment well    Behavior During Therapy WFL for tasks assessed/performed                Past Medical History:  Diagnosis Date   Cervical intraepithelial neoplasia III    Gallstones    GERD (gastroesophageal reflux disease)    only prn OTC occasionally   History of blood transfusion 2020   Lupus dx 2020   Multiple sclerosis (HCC) dx 2020   Numbness and tingling of left leg    Past Surgical History:  Procedure Laterality Date   CERVICAL CONIZATION W/BX N/A 01/16/2020   Procedure: CERVICAL CONE BIOPSY, ENDOCERVICAL CURETTAGE;  Surgeon: Theresia Majors, MD;  Location: [facility];  Service: Gynecology;  Laterality: N/A;  request 7:30am OR start in [place] Gyn block requests 45 minutes   CHOLECYSTECTOMY N/A 12/28/2013   Procedure: LAPAROSCOPIC CHOLECYSTECTOMY;  Surgeon: Claud Kelp, MD;  Location: [facility];  Service: General;  Laterality: N/A;   MUSCLE BIOPSY Right 11/06/2022   Procedure: RIGHT THIGH MUSCLE BIOPSY;  Surgeon: Andria Meuse, MD;  Location: [facility];  Service: General;  Laterality: Right;   Patient Active Problem List   Diagnosis Date Noted   Rhabdomyolysis 11/05/2022   AKI (acute kidney injury) (HCC) 11/04/2022   Recurrent idiopathic pericarditis 08/04/2022   Complicated UTI (urinary tract infection) 06/16/2022   Chest pain 06/13/2021   Sepsis (HCC)  06/13/2021   Rash and nonspecific skin eruption 05/21/2020   CIN III (cervical intraepithelial neoplasia grade III) with severe dysplasia 02/09/2020   HSIL (high grade squamous intraepithelial lesion) on Pap smear of cervix 11/03/2019   Cervical cancer screening 09/30/2019   Essential hypertension 09/19/2018   SIRS (systemic inflammatory response syndrome) (HCC) 08/16/2018   SLE (systemic lupus erythematosus related syndrome) (HCC) 08/16/2018   Pancytopenia (HCC)    Anxiety and depression    Elevated liver enzymes 08/02/2018   Normocytic anemia    Tachycardia 07/16/2018   Multiple sclerosis (HCC) 03/31/2018   Common migraine 08/26/2017   Chondromalacia, patella, left 06/24/2017   Healthcare maintenance 12/20/2015   Contraceptive management 10/11/2014   Hidradenitis suppurativa of left axilla 06/07/2013   Dysplasia of cervix, low grade (CIN 1) 07/03/2011   Obesity, Class I, BMI 30.0-34.9 (see actual BMI) 05/14/2011    ONSET DATE: 02-23-23  REFERRING DIAG:  Diagnosis  G93.9 (ICD-10-CM) - Brain lesion  G35 (ICD-10-CM) - Multiple sclerosis (HCC)    THERAPY DIAG:  Other abnormalities of gait and mobility  Muscle weakness (generalized)  Unsteadiness on feet  Rationale for Evaluation and Treatment: Rehabilitation  SUBJECTIVE:  SUBJECTIVE STATEMENT:   Pt reports she is doing better - is more steady with walking  Pt reports she has been rollator since discharge from hospital on 03-03-23  - doesn't usually use in the home; pt has tingling on Lt side of body - has had since 2020; pt reports balance has not been good since October, when she was hospitalized in Oct. 2024;  pt reports her low back feels very tight.  Says Lt side is more impaired than her Rt side due to MS  Pt accompanied by:  self  PERTINENT HISTORY: Diagnosed with MS and Lupus in 2020:  per chart note  "38 y.o. female with past medical history significant for multiple sclerosis, lupus, GERD, cervical intraepithelial neoplasia, cholelithiasis who presented to [facility] ED Pt  on 1/28 with 2-week history of progressive generalized weakness, malaise, headache. Also endorsed nasal congestion, nausea, nosebleeds. Currently on Plaquenil and azathioprine for her lupus. Followed by neurology outpatient, Dr. Epimenio Foot but currently not on any additional disease modifying therapy for her MS currently. Sister also endorses that she has been having a hard time "gripping" items. She has been taking Excedrin without much relief regarding her headaches. Denies any urinary symptoms, no visual changes, no fever, no chills, no rigors, no muscle aches."  Brain lesion, migraines, rhabdomyolysis, tachycardia  PAIN: Pt reports no true pain  Are you having pain?  "Uncomfortable in my lower body area"  PRECAUTIONS: Fall  RED FLAGS: None   WEIGHT BEARING RESTRICTIONS: No  FALLS: Has patient fallen in last 6 months? Yes. Number of falls 1- inside her home - occurred late September  LIVING ENVIRONMENT: Lives with: lives with their family Lives in: House/apartment Stairs: Yes: External: 2 steps; on left going up Has following equipment at home: Walker - 4 wheeled, shower chair, and bed side commode  PLOF: Independent - pt has returned to work (accounting) - is currently remotely from home but plans to return to office when able: is not driving currently   PATIENT GOALS: wants to improve balance and increase LE strength  OBJECTIVE:  Note: Objective measures were completed at Evaluation unless otherwise noted.  DIAGNOSTIC FINDINGS: MRI brain with and without contrast with multifocal regions of cortical diffusion restriction associated with contrast-enhancement bilateral insular regions, right occipital lobe, left cerebellum.   Imaging findings concerning for inflammatory vs CNS vasculitis vs CVA vs lupus cerebritis vs tumefactive MS vs infection.  CT angiogram head/neck with no large vessel occlusion, no stenosis or other acute vascular abnormality, no evidence for vasculitis, multiple prominent cervical, axillary, mediastinal lymph nodes, few scattered pulmonary nodules.  MR T/L-spine with T2 hyperintense foci cervical spine cord C4-5, C6/7 consistent with known MS, no abnormal enhancement to suggest active demyelination, no abnormal spinal cord signal or enhancement in the thoracic spine, epidural fluid collection thoracic spine likely from recent LP.  Underwent lumbar puncture 02/24/2023 by IR with initial fluid analysis not consistent with infection; meningitis/encephalitis panel negative.  EEG with generalized and maximal right parieto-occipital slowing suggestive of cortical dysfunction likely secondary to underlying structural normality, no seizure/epileptiform discharges noted.  COGNITION: Overall cognitive status: Within functional limits for tasks assessed   SENSATION: Pt reports numbness & tingling in LE's - reports this was occurring prior to recent hospitalization  COORDINATION: WFL's    POSTURE: rounded shoulders and forward head  LOWER EXTREMITY ROM:   WFL's bil. LE's    LOWER EXTREMITY MMT:    MMT Right Eval Left Eval  Hip flexion 4 3+  Hip extension 3- 2+  Hip abduction    Hip adduction    Hip internal rotation    Hip external rotation    Knee flexion 3+ 3  Knee extension 4 4  Ankle dorsiflexion 4 4  Ankle plantarflexion    Ankle inversion    Ankle eversion    (Blank rows = not tested)  BED MOBILITY:  Modified independent  TRANSFERS: Assistive device utilized: None  Sit to stand: Modified independence Stand to sit: Modified independence   STAIRS:  TBA   GAIT: Gait pattern: step through pattern Distance walked: 15' Assistive device utilized: Environmental consultant - 4 wheeled Level of  assistance: Modified independence Comments: no device for TUG  FUNCTIONAL TESTS:  5 times sit to stand: 38.09 secs without UE support from mat Timed up and go (TUG): 29.69 secs without device 10 meter walk test: 18.72 secs = 1.75 ft/sec with rollator Pt stood for 1" unsupported with SBA                                                                                                                             TREATMENT DATE: 04-07-23  TherEx:   Sit to stand 5 reps without UE support from chair  - min. Difficulty noted with this exercise  SLR LLE and RLE 10 reps each leg - 2# weight used for PRE Standing bil. Heel raise 10 reps for gastroc strengthening Lt and Rt hip abduction in sidelying positions 10 reps - 2# used on each leg for strengthening Clam shell exercise with 2# weight - RLE and LLE 10 reps each  Pt performed hip abdct. - hip flexion/extension RLE and LLE 10 reps - no weight used on either leg due to difficulty with this exercise  Lt hamstring stretching in standing position 20 sec hold x 1 rep (runner's stretch at steps)  Lt gastroc stretching - with heels off step 15 sec hold x 1 rep  Tall kneeling position - pt performed bil. Shoulder flexion to 90 degrees for core stabilization; mini squats  10 reps  Birddog pose - 3 reps on each side- with min assist for stability - attempted 5 sec hold  TherAct:  Step up exercise LLE onto 6" step 10 reps with bil. UE support on rails for quad strengthening  Performed tap ups to 1st step - 5 reps RLE without UE support with CGA:  10 reps to 2nd step without UE support:  10 reps to 3rd step with CGA   Amb. Inside // bars - 10' x 2 forwards on tiptoes for gastroc strengthening  Pt performed stepping over and back of black balance beam inside // bars with UE support prn 10 reps each leg for closed chain strengthening and improved SLS on each leg  Pt performed Lt knee extension control exercise with Ade theraband - 15 reps each  position - LLE in midstance, forward stance, and backward stance  Pt transferred floor to stand with UE  support on mat with SBA  Gait:  pt gait trained 350' (3 laps around track) without device with CGA - pt noted to look up more than in previous sessions  Step training - pt negotiated 4 steps x 2 reps using bil. Hand rails and step over step sequence on 1st rep; Lt hand rail only on 2nd rep - SBA   Medbridge HEP update:  added bil. Heel raise to HEP and plantarflexion with theraband in seated position to current Medbridge HEP --[email or web address]  Updated HEP; to include SLR, hip abdct., hamstring and heel cord stretching (added these exs. To same Medbridge HEP)  HEP - for bil. LE strengthening  Access Code: [email or web address] URL: [email or web address] Date: 03/16/2023 Prepared by: Maebelle Munroe  Exercises - Supine Bridge  - 1 x daily - 7 x weekly - 1 sets - 10 reps - 5 sec hold - Clamshell  - 1 x daily - 7 x weekly - 1 sets - 10 reps - 3-5 sec hold - Sit to Stand  - 1 x daily - 7 x weekly - 1 sets - 10 reps - Standing Hip Extension with Counter Support  - 1 x daily - 7 x weekly - 3 sets - 10 reps - Seated Hamstring Curl with Anchored Resistance  - 1 x daily - 7 x weekly - 1 sets - 10 reps - Hooklying Single Knee to Chest  - 1 x daily - 7 x weekly - 1 sets - 10 reps - Single Knee to Chest Stretch  - 1 x daily - 7 x weekly - 1 sets - 2 reps - 15 sec hold - Seated Knee Extension with Resistance  - 1 x daily - 7 x weekly - 1 sets - 10 reps - 5 sec hold hold  PATIENT EDUCATION: Education details: HEP for bil. LE strengthening Person educated: Patient Education method: Explanation, Demonstration, and Handouts Education comprehension: verbalized understanding and returned demonstration  HOME EXERCISE PROGRAM: Medbridge [email or web address]  GOALS: Goals reviewed with patient? Yes  SHORT TERM GOALS: Target date: 04-17-23   Pt will increase Berg balance test score by at least 5 points to demo  improved balance and reduced fall risk. Baseline: Goal status: INITIAL  2.  Improve TUG score to </= 24 secs without use of device to demo improved mobility and reduced fall risk. Baseline: 29.69 secs without device Goal status: INITIAL  3.  Improve 5x sit to stand score to </= 30 secs without UE support from high/low mat table. Baseline: 38.09 secs without UE support Goal status: INITIAL  4.  Increase gait velocity to >/= 2.2 ft/sec with rollator for increased gait efficiency. Baseline: 18.72 secs = 1.75 ft/sec with rollator Goal status: INITIAL  5.  Independent in HEP for bil. LE strengthening. Baseline:  Goal status: INITIAL  6.  Pt will ambulate 115' without device with SBA on flat, even surface.  Baseline:  Goal status: INITIAL   LONG TERM GOALS: Target date: 05-15-23  Pt will increase Berg balance test score by at least 10 points to demo improved balance and reduced fall risk. Baseline:  Goal status: INITIAL  2.   Improve TUG score to </= 19 secs without use of device to demo improved mobility and reduced fall risk. Baseline: 29.69 secs without device Goal status: INITIAL  3.   Improve 5x sit to stand score to </= 30 secs without UE support from high/low mat table. Baseline: 38.09 secs without UE support Goal status: INITIAL  4.  Increase gait velocity to >/= 2.6 ft/sec with rollator for increased gait efficiency. Baseline: 18.72 secs = 1.75 ft/sec with rollator Goal status: INITIAL  5.  Pt will ambulate 350' without device with SBA on flat, even surface.  Baseline:  Goal status: INITIAL  6.  Independent in updated HEP for bil. LE strengthening. Baseline:  Goal status: INITIAL  ASSESSMENT:  CLINICAL IMPRESSION: PT session focused on gait training without device and LE strengthening with knee extension control exercise with [place]gar theraband performed for 1st time in today's session;  exercise was added to improve Lt knee control and reduce knee hyperextension  in stance phase of gait.  Pt able to perform PRE's (SLR and hip abduction) with 2# weight on each leg for 1st time in today's session.  Increased balance and steadiness noted with ambulation without use of assistive device.  Pt is progressing well.  Cont with POC.   OBJECTIVE IMPAIRMENTS: Abnormal gait, decreased balance, decreased endurance, decreased strength, and impaired tone.   ACTIVITY LIMITATIONS: carrying, lifting, bending, standing, squatting, stairs, transfers, and locomotion level  PARTICIPATION LIMITATIONS: meal prep, cleaning, laundry, driving, shopping, community activity, and occupation  PERSONAL FACTORS: Time since onset of injury/illness/exacerbation and 1-2 comorbidities: MS and Lupus  are also affecting patient's functional outcome.   REHAB POTENTIAL: Good  CLINICAL DECISION MAKING: Evolving/moderate complexity  EVALUATION COMPLEXITY: Moderate  PLAN:  PT FREQUENCY: 1-2x/week  PT DURATION: 8 weeks  PLANNED INTERVENTIONS: 97110-Therapeutic exercises, 97530- Therapeutic activity, [id number]- Neuromuscular re-education, [phone]- Self Care, [id number]- Gait training, [phone]- Aquatic Therapy, Patient/Family education, Balance training, and Stair training  PLAN FOR NEXT SESSION: Check STG's:  cont with gait and balance training - how did knee extension control exercise with theraband go?   Kary Kos, PT 04/08/2023, 2:41 PM

## 2023-04-07 NOTE — Therapy (Signed)
 OUTPATIENT OCCUPATIONAL THERAPY NEURO TREATMENT  Patient Name: Emily Hicks MRN: [id number] DOB:08-13-1985, 38 y.o., female Today's Date: 04/07/2023  PCP: None yet.  (Dr. Epimenio Foot neurologist) REFERRING PROVIDER: Jerald Kief, MD  END OF SESSION:  OT End of Session - 04/07/23 0850     Visit Number 4    Number of Visits 9    Date for OT Re-Evaluation 05/14/23    Authorization Type [employer], VL: 20 each discipline, no auth required    OT Start Time 0847    OT Stop Time 0930    OT Time Calculation (min) 43 min    Activity Tolerance Patient tolerated treatment well    Behavior During Therapy [facility] for tasks assessed/performed;Flat affect             Past Medical History:  Diagnosis Date   Cervical intraepithelial neoplasia III    Gallstones    GERD (gastroesophageal reflux disease)    only prn OTC occasionally   History of blood transfusion 2020   Lupus dx 2020   Multiple sclerosis (HCC) dx 2020   Numbness and tingling of left leg    Past Surgical History:  Procedure Laterality Date   CERVICAL CONIZATION W/BX N/A 01/16/2020   Procedure: CERVICAL CONE BIOPSY, ENDOCERVICAL CURETTAGE;  Surgeon: Theresia Majors, MD;  Location: [facility] [place];  Service: Gynecology;  Laterality: N/A;  request 7:30am OR start in [place] Gyn block requests 45 minutes   CHOLECYSTECTOMY N/A 12/28/2013   Procedure: LAPAROSCOPIC CHOLECYSTECTOMY;  Surgeon: Claud Kelp, MD;  Location: [facility];  Service: General;  Laterality: N/A;   MUSCLE BIOPSY Right 11/06/2022   Procedure: RIGHT THIGH MUSCLE BIOPSY;  Surgeon: Andria Meuse, MD;  Location: [facility] OR;  Service: General;  Laterality: Right;   Patient Active Problem List   Diagnosis Date Noted   Rhabdomyolysis 11/05/2022   AKI (acute kidney injury) (HCC) 11/04/2022   Recurrent idiopathic pericarditis 08/04/2022   Complicated UTI (urinary tract infection) 06/16/2022   Chest pain 06/13/2021   Sepsis (HCC) 06/13/2021   Rash and  nonspecific skin eruption 05/21/2020   CIN III (cervical intraepithelial neoplasia grade III) with severe dysplasia 02/09/2020   HSIL (high grade squamous intraepithelial lesion) on Pap smear of cervix 11/03/2019   Cervical cancer screening 09/30/2019   Essential hypertension 09/19/2018   SIRS (systemic inflammatory response syndrome) (HCC) 08/16/2018   SLE (systemic lupus erythematosus related syndrome) (HCC) 08/16/2018   Pancytopenia (HCC)    Anxiety and depression    Elevated liver enzymes 08/02/2018   Normocytic anemia    Tachycardia 07/16/2018   Multiple sclerosis (HCC) 03/31/2018   Common migraine 08/26/2017   Chondromalacia, patella, left 06/24/2017   Healthcare maintenance 12/20/2015   Contraceptive management 10/11/2014   Hidradenitis suppurativa of left axilla 06/07/2013   Dysplasia of cervix, low grade (CIN 1) 07/03/2011   Obesity, Class I, BMI 30.0-34.9 (see actual BMI) 05/14/2011    ONSET DATE: 03/02/2023 (referral date)   REFERRING DIAG: G93.9 (ICD-10-CM) - Brain lesion G35 (ICD-10-CM) - Multiple sclerosis (HCC)  THERAPY DIAG:  Muscle weakness (generalized)  Other lack of coordination  Unsteadiness on feet  Other disturbances of skin sensation  Rationale for Evaluation and Treatment: Rehabilitation  SUBJECTIVE:   SUBJECTIVE STATEMENT: My knee is better today - no pain. I styled my hair on Saturday Pt accompanied by: self  PERTINENT HISTORY: Hospital ED on 1/20 with 2-week history of progressive generalized weakness, malaise, headache. Transferred to [facility] 1/20 for neurological work up. MRI reveals Multifocal regions  of predominantly cortical diffusion restriction with associated contrast enhancement, involving the bilateral insular regions, right occipital lobe, and left cerebellum. EEG suggestive cortical dysfunction arising from right parieto-occipital likely secondary to underlying structural abnormality. 1/21 underwent LP  PMH: multiple sclerosis (diagnosed  2020), lupus (diagnosed 2020), GERD, cervical intraepithelial neoplasia, cholelithiasis   Imaging findings concerning for inflammatory vs CNS vasculitis vs CVA vs lupus cerebritis vs tumefactive MS vs infection  PRECAUTIONS: Fall  WEIGHT BEARING RESTRICTIONS: No  PAIN:  Are you having pain?  Pt reports she is uncomfortable in lower back and Lt knee  FALLS: Has patient fallen in last 6 months? Yes. 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  HAND DOMINANCE: Right  ADLs: Transfers/ambulation related to ADLs: using rollator currently but only needs with exacerbations Eating: mod I  Grooming: mod I (fatigues with styling hair)  UB Dressing: slower but mod I  LB Dressing: slower but mod I  Toileting: mod I - difficulty w/ lower toilets Bathing: mod I with shower chair Tub Shower transfers: mod I  Equipment: Shower seat with back and 1 small grab bar  Pt back to work full time and reports typing is doing fine  IADLs: Shopping: when pt is doing well, pt does. Husband has been doing since last October Light housekeeping: pt does light cleaning normally but hasn't since October Meal Prep: light cooking but not since October Community mobility: pt currently not driving since January hospitalization Medication management: mod I w/ pillbox (pt loads)  Financial management: mod I  Handwriting: 100% legible and Mild micrographia (normal for her)  MOBILITY STATUS:  using rollator currently since last exacerbation   ACTIVITY TOLERANCE: Activity tolerance: fatigues easily (worse since  October)   UPPER EXTREMITY ROM:  BUE AROM WFL's (but more difficulty in high flexion)  UPPER EXTREMITY MMT:   RUE MMT 4/5, LUE MMT 3+/5 grossly   HAND FUNCTION: Grip strength: Right: 28  lbs; Left: 20 lbs  COORDINATION: 9 Hole Peg test: Right: 30.79 sec; Left: 32.55 sec (? Mild apraxia)  SENSATION: Pins and needles sensation entire Lt side  EDEMA: none in Ue's but some in both feet/ankles  MUSCLE TONE: not tested  COGNITION: Overall cognitive status:  slower processing   VISION: Subjective report: Pt reports vision deficits has resolved - pt initially had visual deficits including Lt visual field loss and sensitivity to light Baseline vision: Wears glasses all the time Visual history:  none  VISION ASSESSMENT: Not tested   PERCEPTION: Not tested  PRAXIS: Not tested  OBSERVATIONS: Pt has not returned to IADLS since October exacerbation. However, pt has returned to work w/ no problems reported.  Overall fatigue and muscle weakness  TREATMENT DATE: 04/07/23  Pt reports styling own hair with flat iron this past weekend. No burns and felt safe doing  Issued theraband HEP and yellow resistance band - pt demo each x 10 reps. See pt instructions for details  Gripper set at level 1 resistance to pick up blocks Lt hand for sustained grip strength/control. Gripper set at level 2 resistance to pick up blocks Rt hand for sustained grip strength    PATIENT EDUCATION: Education details: theraband HEP  Person educated: Patient Education method: Programmer, multimedia, Demonstration, Verbal cues, and Handouts Education comprehension: verbalized understanding, returned demonstration, verbal cues required, and needs further education  HOME EXERCISE PROGRAM: 03/24/23: coordination and putty HEP  03/31/23: sensory education 04/07/23: Bearl Mulberry HEP    GOALS: Goals  reviewed with patient? Yes  SHORT TERM GOALS: Target date: 04/13/23  Independent with HEP for UE strength, grip strength, and high level coordination Baseline: Goal status: IN PROGRESS  2.  Pt to verbalize understanding with energy conservation techniques Baseline:  Goal status: MET  3.  Pt to verbalize understanding with possible A/E recommendations and task modifications prn to increase ease, safety, and independence with IADLS Baseline:  Goal status: MET   4.  Pt to perform environmental scanning with simple physical task with 90% accuracy in prep for potential return to driving Baseline:  Goal status: INITIAL   LONG TERM GOALS: Target date: 05/14/23:   Pt to return to light cooking at distant supervision level Baseline:  Goal status: INITIAL  2.  Pt to return to light cleaning tasks at home at mod I level Baseline:  Goal status: INITIAL  3.  Pt to improve grip strength bilaterally by 5 lbs or more to open tight containers/jars w/ greater ease Baseline: Rt = 28 lbs, Lt = 20 lbs Goal status: INITIAL   ASSESSMENT:  CLINICAL IMPRESSION: Patient seen today for occupational therapy treatment for brain lesion/MS. Hx includes Lupus, complicated recent medical history (see above). Pt has met 2 STG's at this time and progressing towards remaining goals. Patient currently presents below baseline level of functioning demonstrating functional deficits and impairments as noted below. Pt would benefit from skilled OT services in the outpatient setting to work on impairments as noted below to help pt return to PLOF as able.   Marland Kitchen   PERFORMANCE DEFICITS: in functional skills including ADLs, IADLs, coordination, sensation, strength, Fine motor control, mobility, balance, endurance, decreased knowledge of use of DME, and UE functional use, cognitive skills including problem solving and safety awareness, and psychosocial skills including coping strategies.   IMPAIRMENTS: are limiting patient  from ADLs, IADLs, and work.   CO-MORBIDITIES: has co-morbidities such as lupus  that affects occupational performance. Patient will benefit from skilled OT to address above impairments and improve overall function.  MODIFICATION OR ASSISTANCE TO COMPLETE EVALUATION: No modification of tasks or assist necessary to complete an evaluation.  OT OCCUPATIONAL PROFILE AND HISTORY: Detailed assessment: Review of records and additional review of physical, cognitive, psychosocial history related to current functional performance.  CLINICAL DECISION MAKING: Moderate - several treatment options, min-mod task modification necessary  REHAB POTENTIAL: Good  EVALUATION COMPLEXITY: Moderate    PLAN:  OT FREQUENCY: 1-2x/week  OT DURATION: 8 weeks  PLANNED INTERVENTIONS: 97535 self care/ADL training, 16109 therapeutic exercise, 97530 therapeutic activity, 97112 neuromuscular re-education, 97140 manual therapy, 97039 fluidotherapy, passive range of motion, functional mobility training, visual/perceptual remediation/compensation, energy conservation, coping strategies training, patient/family education, and DME and/or AE instructions  RECOMMENDED OTHER SERVICES: none at this  time  CONSULTED AND AGREED WITH PLAN OF CARE: Patient  PLAN FOR NEXT SESSION: review theraband HEP, address STG #4 as able (modify prn for balance)    Sheran Lawless, OT 04/07/2023, 8:51 AM

## 2023-04-07 NOTE — Patient Instructions (Signed)
  Strengthening: Resisted Flexion   Hold tubing with _Rt, then Lt_ arm(s) at side. Pull forward and up. Move shoulder through pain-free range of motion. Repeat __10__ times per set.  Do _1-2_ sessions per day , every other day   Strengthening: Resisted Extension   Hold tubing in __BOTH___ hand(s), arms forward. Pull arms back together, keep elbows straight. Repeat _10___ times per set. Do _1-2___ sessions per day, every other day.   Resisted Horizontal Abduction: Bilateral   Sit or stand, tubing in both hands, arms out in front. Keeping arms straight, pinch shoulder blades together and stretch arms out. Repeat _10___ times per set. Do _1-2___ sessions per day, every other day.   Elbow Flexion: Resisted   With tubing held in __both____ hand(s) and other end secured under foot, curl arms up as far as possible. Repeat _10___ times per set. Do _1-2___ sessions per day, every other day.    Elbow Extension: Resisted   Sit in chair with resistive band secured at Lt OPPOSITE SHOULDER and ___RT____ elbow bent. Straighten RT elbow. Repeat _10___ times per set. Then repeat to other side. Do _1-2___ sessions per day, every other day.

## 2023-04-08 ENCOUNTER — Encounter: Payer: Self-pay | Admitting: Physical Therapy

## 2023-04-14 ENCOUNTER — Ambulatory Visit: Payer: 59 | Admitting: Occupational Therapy

## 2023-04-14 ENCOUNTER — Ambulatory Visit: Payer: 59

## 2023-04-14 DIAGNOSIS — R2681 Unsteadiness on feet: Secondary | ICD-10-CM

## 2023-04-14 DIAGNOSIS — R208 Other disturbances of skin sensation: Secondary | ICD-10-CM

## 2023-04-14 DIAGNOSIS — M6281 Muscle weakness (generalized): Secondary | ICD-10-CM | POA: Diagnosis not present

## 2023-04-14 DIAGNOSIS — R278 Other lack of coordination: Secondary | ICD-10-CM

## 2023-04-14 DIAGNOSIS — R2689 Other abnormalities of gait and mobility: Secondary | ICD-10-CM

## 2023-04-14 NOTE — Therapy (Signed)
 OUTPATIENT PHYSICAL THERAPY NEURO TREATMENT NOTE   Patient Name: Emily Hicks MRN: [id number] DOB:[birth year], 38 y.o., female Today's Date: 04/14/2023   PCP: No PCP Per Pt REFERRING PROVIDER: Jerald Kief, MD  END OF SESSION:  PT End of Session - 04/14/23 0753     Visit Number 5    Number of Visits 9    Date for PT Re-Evaluation 05/15/23    Authorization Type U[employer]    Authorization - Number of Visits 20    PT Start Time 0800    PT Stop Time 0840    PT Time Calculation (min) 40 min    Equipment Utilized During Treatment Gait belt    Activity Tolerance Patient tolerated treatment well    Behavior During Therapy WFL for tasks assessed/performed                Past Medical History:  Diagnosis Date   Cervical intraepithelial neoplasia III    Gallstones    GERD (gastroesophageal reflux disease)    only prn OTC occasionally   History of blood transfusion 2020   Lupus dx 2020   Multiple sclerosis (HCC) dx 2020   Numbness and tingling of left leg    Past Surgical History:  Procedure Laterality Date   CERVICAL CONIZATION W/BX N/A 01/16/2020   Procedure: CERVICAL CONE BIOPSY, ENDOCERVICAL CURETTAGE;  Surgeon: Theresia Majors, MD;  Location: [facility];  Service: Gynecology;  Laterality: N/A;  request 7:30am OR start in [place] Gyn block requests 45 minutes   CHOLECYSTECTOMY N/A 12/28/2013   Procedure: LAPAROSCOPIC CHOLECYSTECTOMY;  Surgeon: Claud Kelp, MD;  Location: [facility];  Service: General;  Laterality: N/A;   MUSCLE BIOPSY Right 11/06/2022   Procedure: RIGHT THIGH MUSCLE BIOPSY;  Surgeon: Andria Meuse, MD;  Location: [facility];  Service: General;  Laterality: Right;   Patient Active Problem List   Diagnosis Date Noted   Rhabdomyolysis 11/05/2022   AKI (acute kidney injury) (HCC) 11/04/2022   Recurrent idiopathic pericarditis 08/04/2022   Complicated UTI (urinary tract infection) 06/16/2022   Chest pain 06/13/2021   Sepsis (HCC)  06/13/2021   Rash and nonspecific skin eruption 05/21/2020   CIN III (cervical intraepithelial neoplasia grade III) with severe dysplasia 02/09/2020   HSIL (high grade squamous intraepithelial lesion) on Pap smear of cervix 11/03/2019   Cervical cancer screening 09/30/2019   Essential hypertension 09/19/2018   SIRS (systemic inflammatory response syndrome) (HCC) 08/16/2018   SLE (systemic lupus erythematosus related syndrome) (HCC) 08/16/2018   Pancytopenia (HCC)    Anxiety and depression    Elevated liver enzymes 08/02/2018   Normocytic anemia    Tachycardia 07/16/2018   Multiple sclerosis (HCC) 03/31/2018   Common migraine 08/26/2017   Chondromalacia, patella, left 06/24/2017   Healthcare maintenance 12/20/2015   Contraceptive management 10/11/2014   Hidradenitis suppurativa of left axilla 06/07/2013   Dysplasia of cervix, low grade (CIN 1) 07/03/2011   Obesity, Class I, BMI 30.0-34.9 (see actual BMI) 05/14/2011    ONSET DATE: 02-23-23  REFERRING DIAG:  Diagnosis  G93.9 (ICD-10-CM) - Brain lesion  G35 (ICD-10-CM) - Multiple sclerosis (HCC)    THERAPY DIAG:  Other abnormalities of gait and mobility  Muscle weakness (generalized)  Unsteadiness on feet  Other lack of coordination  Rationale for Evaluation and Treatment: Rehabilitation  SUBJECTIVE:  SUBJECTIVE STATEMENT:  Patient arrives to clinic alone using [facility]- she is using this primarily. Exercises going well. Denies falls.   Pt accompanied by: self  PERTINENT HISTORY: Diagnosed with MS and Lupus in 2020:  per chart note  "38 y.o. female with past medical history significant for multiple sclerosis, lupus, GERD, cervical intraepithelial neoplasia, cholelithiasis who presented to [facility] ED Pt  on 1/28 with 2-week history  of progressive generalized weakness, malaise, headache. Also endorsed nasal congestion, nausea, nosebleeds. Currently on Plaquenil and azathioprine for her lupus. Followed by neurology outpatient, Dr. Epimenio Foot but currently not on any additional disease modifying therapy for her MS currently. Sister also endorses that she has been having a hard time "gripping" items. She has been taking Excedrin without much relief regarding her headaches. Denies any urinary symptoms, no visual changes, no fever, no chills, no rigors, no muscle aches."  Brain lesion, migraines, rhabdomyolysis, tachycardia  PAIN: Pt reports no true pain  Are you having pain?  "Uncomfortable in my lower body area"  PRECAUTIONS: Fall  RED FLAGS: None   WEIGHT BEARING RESTRICTIONS: No  FALLS: Has patient fallen in last 6 months? Yes. Number of falls 1- inside her home - occurred late September  LIVING ENVIRONMENT: Lives with: lives with their family Lives in: House/apartment Stairs: Yes: External: 2 steps; on left going up Has following equipment at home: Walker - 4 wheeled, shower chair, and bed side commode  PLOF: Independent - pt has returned to work (accounting) - is currently remotely from home but plans to return to office when able: is not driving currently   PATIENT GOALS: wants to improve balance and increase LE strength  OBJECTIVE:  Note: Objective measures were completed at Evaluation unless otherwise noted.  DIAGNOSTIC FINDINGS: MRI brain with and without contrast with multifocal regions of cortical diffusion restriction associated with contrast-enhancement bilateral insular regions, right occipital lobe, left cerebellum.  Imaging findings concerning for inflammatory vs CNS vasculitis vs CVA vs lupus cerebritis vs tumefactive MS vs infection.  CT angiogram head/neck with no large vessel occlusion, no stenosis or other acute vascular abnormality, no evidence for vasculitis, multiple prominent cervical, axillary,  mediastinal lymph nodes, few scattered pulmonary nodules.  MR T/L-spine with T2 hyperintense foci cervical spine cord C4-5, C6/7 consistent with known MS, no abnormal enhancement to suggest active demyelination, no abnormal spinal cord signal or enhancement in the thoracic spine, epidural fluid collection thoracic spine likely from recent LP.  Underwent lumbar puncture 02/24/2023 by IR with initial fluid analysis not consistent with infection; meningitis/encephalitis panel negative.  EEG with generalized and maximal right parieto-occipital slowing suggestive of cortical dysfunction likely secondary to underlying structural normality, no seizure/epileptiform discharges noted.  COGNITION: Overall cognitive status: Within functional limits for tasks assessed   SENSATION: Pt reports numbness & tingling in LE's - reports this was occurring prior to recent hospitalization  COORDINATION: WFL's    POSTURE: rounded shoulders and forward head  LOWER EXTREMITY ROM:   WFL's bil. LE's    LOWER EXTREMITY MMT:    MMT Right Eval Left Eval  Hip flexion 4 3+  Hip extension 3- 2+  Hip abduction    Hip adduction    Hip internal rotation    Hip external rotation    Knee flexion 3+ 3  Knee extension 4 4  Ankle dorsiflexion 4 4  Ankle plantarflexion    Ankle inversion    Ankle eversion    (Blank rows = not tested)  BED MOBILITY:  Modified  independent  TRANSFERS: Assistive device utilized: None  Sit to stand: Modified independence Stand to sit: Modified independence   STAIRS:  TBA   GAIT: Gait pattern: step through pattern Distance walked: 90' Assistive device utilized: Environmental consultant - 4 wheeled Level of assistance: Modified independence Comments: no device for TUG  FUNCTIONAL TESTS:  5 times sit to stand: 38.09 secs without UE support from mat Timed up and go (TUG): 29.69 secs without device 10 meter walk test: 18.72 secs = 1.75 ft/sec with rollator Pt stood for 1" unsupported with SBA                                                                                                                              TREATMENT   [facility] PT Assessment - 04/14/23 0001       Standardized Balance Assessment   Standardized Balance Assessment Timed Up and Go Test    Five times sit to stand comments  18.1   no UE   10 Meter Walk 1.32ft/s or .64m/s   no AD     Berg Balance Test   Sit to Stand Able to stand without using hands and stabilize independently    Standing Unsupported Able to stand safely 2 minutes    Sitting with Back Unsupported but Feet Supported on Floor or Stool Able to sit safely and securely 2 minutes    Stand to Sit Sits safely with minimal use of hands    Transfers Able to transfer safely, minor use of hands    Standing Unsupported with Eyes Closed Able to stand 10 seconds with supervision    Standing Unsupported with Feet Together Able to place feet together independently and stand for 1 minute with supervision    From Standing, Reach Forward with Outstretched Arm Can reach forward >12 cm safely (5")    From Standing Position, Pick up Object from Floor Able to pick up shoe safely and easily    From Standing Position, Turn to Look Behind Over each Shoulder Turn sideways only but maintains balance    Turn 360 Degrees Able to turn 360 degrees safely but slowly    Standing Unsupported, Alternately Place Feet on Step/Stool Able to complete 4 steps without aid or supervision    Standing Unsupported, One Foot in Front Able to plae foot ahead of the other independently and hold 30 seconds    Standing on One Leg Able to lift leg independently and hold equal to or more than 3 seconds    Total Score 44      Timed Up and Go Test   Normal TUG (seconds) 21.25   no AD           Therex:  -prone HSC with 3# ankle weights -prone hip extension body weight  -seated march over kettlebell body weight -scifit hills level 1 x5 mins    PATIENT EDUCATION: Education details: goal  outcomes, continue HEP Person educated: Patient Education method: Explanation,  Demonstration, and Handouts Education comprehension: verbalized understanding and returned demonstration  HOME EXERCISE PROGRAM: Access Code: [email or web address] URL: [email or web address] Date: 03/16/2023 Prepared by: Maebelle Munroe  Exercises - Supine Bridge  - 1 x daily - 7 x weekly - 1 sets - 10 reps - 5 sec hold - Clamshell  - 1 x daily - 7 x weekly - 1 sets - 10 reps - 3-5 sec hold - Sit to Stand  - 1 x daily - 7 x weekly - 1 sets - 10 reps - Standing Hip Extension with Counter Support  - 1 x daily - 7 x weekly - 3 sets - 10 reps - Seated Hamstring Curl with Anchored Resistance  - 1 x daily - 7 x weekly - 1 sets - 10 reps - Hooklying Single Knee to Chest  - 1 x daily - 7 x weekly - 1 sets - 10 reps - Single Knee to Chest Stretch  - 1 x daily - 7 x weekly - 1 sets - 2 reps - 15 sec hold - Seated Knee Extension with Resistance  - 1 x daily - 7 x weekly - 1 sets - 10 reps - 5 sec hold hold  GOALS: Goals reviewed with patient? Yes  SHORT TERM GOALS: Target date: 04-17-23   Pt will increase Berg balance test score by at least 5 points to demo improved balance and reduced fall risk. Baseline: 44/56 Goal status: MET  2.  Improve TUG score to </= 24 secs without use of device to demo improved mobility and reduced fall risk. Baseline: 29.69 secs without device; 21.25 no AD Goal status: MET  3.  Improve 5x sit to stand score to </= 30 secs without UE support from high/low mat table. Baseline: 38.09 secs without UE support; 18.1s no UE Goal status: MET  4.  Increase gait velocity to >/= 2.2 ft/sec with rollator for increased gait efficiency. Baseline: 18.72 secs = 1.75 ft/sec with rollator; 1.41ft/s or .75m/s no AD Goal status: IN PROGRESS  5.  Independent in HEP for bil. LE strengthening. Baseline: independent  Goal status: MET  6.  Pt will ambulate 115' without device with SBA on flat, even  surface.  Baseline: 115' + clinic distances no AD- distant supervision Goal status: MET   LONG TERM GOALS: Target date: 05-15-23  Pt will increase Berg balance test score by at least 10 points to demo improved balance and reduced fall risk. Baseline:  Goal status: INITIAL  2.   Improve TUG score to </= 19 secs without use of device to demo improved mobility and reduced fall risk. Baseline: 29.69 secs without device Goal status: INITIAL  3.   Improve 5x sit to stand score to </= 30 secs without UE support from high/low mat table. Baseline: 38.09 secs without UE support Goal status: INITIAL  4.   Increase gait velocity to >/= 2.6 ft/sec with rollator for increased gait efficiency. Baseline: 18.72 secs = 1.75 ft/sec with rollator Goal status: INITIAL  5.  Pt will ambulate 350' without device with SBA on flat, even surface.  Baseline:  Goal status: INITIAL  6.  Independent in updated HEP for bil. LE strengthening. Baseline:  Goal status: INITIAL  ASSESSMENT:  CLINICAL IMPRESSION: Patient seen for skilled PT session with emphasis on STG assessment and LE strengthening. She met 5/6 STG. Patient demonstrated increased fall risk noted by score of 44/56 on the [facility] Scale.  <45/56 = fall risk, <42/56 = predictive of recurrent falls, <40/56 =  100% fall risk  >41 = independent, 21-40 = assistive device, 0-20 = wheelchair level  MDC 6.9 (4 pts 45-56, 5 pts 35-44, 7 pts 25-34) (ANPTA Core Set of Outcome Measures for Adults with Neurologic Conditions, 2018). Patient completed the Timed Up and Go test (TUG) in 21.25 seconds.  Geriatrics: need for further assessment of fall risk: >= 12 sec; Recurrent falls: > 15 sec; Vestibular Disorders fall risk: > 15 sec; Parkinson's Disease fall risk: > 16 sec ([email or web address], 2023). 10 Meter Walk Test: Patient instructed to walk 10 meters (32.8 ft) as quickly and as safely as possible at their normal speed x2 and at a fast speed x2. Time measured from  2 meter mark to 8 meter mark to accommodate ramp-up and ramp-down.  Normal speed: .39m/s Cut off scores: <0.4 m/s = household Ambulator, 0.4-0.8 m/s = limited community Ambulator, >0.8 m/s = community Ambulator, >1.2 m/s = crossing a street, <1.0 = increased fall risk MCID 0.05 m/s (small), 0.13 m/s (moderate), 0.06 m/s (significant)  (ANPTA Core Set of Outcome Measures for Adults with Neurologic Conditions, 2018). Five times Sit to Stand Test (FTSS) Method: Use a straight back chair with a solid seat that is 17-18" high. Ask participant to sit on the chair with arms folded across their chest.   Instructions: "Stand up and sit down as quickly as possible 5 times, keeping your arms folded across your chest."   Measurement: Stop timing when the participant touches the chair in sitting the 5th time.  TIME: 18.1 sec  Cut off scores indicative of increased fall risk: >12 sec CVA, >16 sec PD, >13 sec vestibular (ANPTA Core Set of Outcome Measures for Adults with Neurologic Conditions, 2018). Patient continues to demonstrate intermittent L knee instability with tendency toward hyperextension during gait. Continue POC.   OBJECTIVE IMPAIRMENTS: Abnormal gait, decreased balance, decreased endurance, decreased strength, and impaired tone.   ACTIVITY LIMITATIONS: carrying, lifting, bending, standing, squatting, stairs, transfers, and locomotion level  PARTICIPATION LIMITATIONS: meal prep, cleaning, laundry, driving, shopping, community activity, and occupation  PERSONAL FACTORS: Time since onset of injury/illness/exacerbation and 1-2 comorbidities: MS and Lupus  are also affecting patient's functional outcome.   REHAB POTENTIAL: Good  CLINICAL DECISION MAKING: Evolving/moderate complexity  EVALUATION COMPLEXITY: Moderate  PLAN:  PT FREQUENCY: 1-2x/week  PT DURATION: 8 weeks  PLANNED INTERVENTIONS: 97110-Therapeutic exercises, 97530- Therapeutic activity, [id number]- Neuromuscular re-education,  [phone]- Self Care, [id number]- Gait training, [phone]- Aquatic Therapy, Patient/Family education, Balance training, and Stair training  PLAN FOR NEXT SESSION: Check STG's:  cont with gait and balance training - how did knee extension control exercise with theraband go?   Westley Foots, PT Westley Foots, PT, DPT, CBIS  04/14/2023, 8:47 AM

## 2023-04-14 NOTE — Therapy (Signed)
 OUTPATIENT OCCUPATIONAL THERAPY NEURO TREATMENT  Patient Name: Emily Hicks MRN: [id number] DOB:1985/08/13, 38 y.o., female Today's Date: 04/14/2023  PCP: None yet.  (Dr. Epimenio Foot neurologist) REFERRING PROVIDER: Jerald Kief, MD  END OF SESSION:  OT End of Session - 04/14/23 0845     Visit Number 5    Number of Visits 9    Date for OT Re-Evaluation 05/14/23    Authorization Type UHC, VL: 20 each discipline, no auth required    OT Start Time 0845    OT Stop Time 0930    OT Time Calculation (min) 45 min    Activity Tolerance Patient tolerated treatment well    Behavior During Therapy WFL for tasks assessed/performed;Flat affect             Past Medical History:  Diagnosis Date   Cervical intraepithelial neoplasia III    Gallstones    GERD (gastroesophageal reflux disease)    only prn OTC occasionally   History of blood transfusion 2020   Lupus dx 2020   Multiple sclerosis (HCC) dx 2020   Numbness and tingling of left leg    Past Surgical History:  Procedure Laterality Date   CERVICAL CONIZATION W/BX N/A 01/16/2020   Procedure: CERVICAL CONE BIOPSY, ENDOCERVICAL CURETTAGE;  Surgeon: Theresia Majors, MD;  Location: [facility] [place];  Service: Gynecology;  Laterality: N/A;  request 7:30am OR start in [place] Gyn block requests 45 minutes   CHOLECYSTECTOMY N/A 12/28/2013   Procedure: LAPAROSCOPIC CHOLECYSTECTOMY;  Surgeon: Claud Kelp, MD;  Location: [facility];  Service: General;  Laterality: N/A;   MUSCLE BIOPSY Right 11/06/2022   Procedure: RIGHT THIGH MUSCLE BIOPSY;  Surgeon: Andria Meuse, MD;  Location: [facility];  Service: General;  Laterality: Right;   Patient Active Problem List   Diagnosis Date Noted   Rhabdomyolysis 11/05/2022   AKI (acute kidney injury) (HCC) 11/04/2022   Recurrent idiopathic pericarditis 08/04/2022   Complicated UTI (urinary tract infection) 06/16/2022   Chest pain 06/13/2021   Sepsis (HCC) 06/13/2021   Rash and  nonspecific skin eruption 05/21/2020   CIN III (cervical intraepithelial neoplasia grade III) with severe dysplasia 02/09/2020   HSIL (high grade squamous intraepithelial lesion) on Pap smear of cervix 11/03/2019   Cervical cancer screening 09/30/2019   Essential hypertension 09/19/2018   SIRS (systemic inflammatory response syndrome) (HCC) 08/16/2018   SLE (systemic lupus erythematosus related syndrome) (HCC) 08/16/2018   Pancytopenia (HCC)    Anxiety and depression    Elevated liver enzymes 08/02/2018   Normocytic anemia    Tachycardia 07/16/2018   Multiple sclerosis (HCC) 03/31/2018   Common migraine 08/26/2017   Chondromalacia, patella, left 06/24/2017   Healthcare maintenance 12/20/2015   Contraceptive management 10/11/2014   Hidradenitis suppurativa of left axilla 06/07/2013   Dysplasia of cervix, low grade (CIN 1) 07/03/2011   Obesity, Class I, BMI 30.0-34.9 (see actual BMI) 05/14/2011    ONSET DATE: 03/02/2023 (referral date)   REFERRING DIAG: G93.9 (ICD-10-CM) - Brain lesion G35 (ICD-10-CM) - Multiple sclerosis (HCC)  THERAPY DIAG:  Muscle weakness (generalized)  Unsteadiness on feet  Other lack of coordination  Other disturbances of skin sensation  Rationale for Evaluation and Treatment: Rehabilitation  SUBJECTIVE:   SUBJECTIVE STATEMENT: My knee is better today - no pain. I styled my hair on Saturday Pt accompanied by: self  PERTINENT HISTORY: Hospital ED on 1/20 with 2-week history of progressive generalized weakness, malaise, headache. Transferred to [facility] 1/20 for neurological work up. MRI reveals Multifocal regions  of predominantly cortical diffusion restriction with associated contrast enhancement, involving the bilateral insular regions, right occipital lobe, and left cerebellum. EEG suggestive cortical dysfunction arising from right parieto-occipital likely secondary to underlying structural abnormality. 1/21 underwent LP  PMH: multiple sclerosis (diagnosed  2020), lupus (diagnosed 2020), GERD, cervical intraepithelial neoplasia, cholelithiasis   Imaging findings concerning for inflammatory vs CNS vasculitis vs CVA vs lupus cerebritis vs tumefactive MS vs infection  PRECAUTIONS: Fall  WEIGHT BEARING RESTRICTIONS: No  PAIN:  Are you having pain?  Pt reports she is uncomfortable in lower back and Lt knee  FALLS: Has patient fallen in last 6 months? Yes. 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  HAND DOMINANCE: Right  ADLs: Transfers/ambulation related to ADLs: using rollator currently but only needs with exacerbations Eating: mod I  Grooming: mod I (fatigues with styling hair)  UB Dressing: slower but mod I  LB Dressing: slower but mod I  Toileting: mod I - difficulty w/ lower toilets Bathing: mod I with shower chair Tub Shower transfers: mod I  Equipment: Shower seat with back and 1 small grab bar  Pt back to work full time and reports typing is doing fine  IADLs: Shopping: when pt is doing well, pt does. Husband has been doing since last October Light housekeeping: pt does light cleaning normally but hasn't since October Meal Prep: light cooking but not since October Community mobility: pt currently not driving since January hospitalization Medication management: mod I w/ pillbox (pt loads)  Financial management: mod I  Handwriting: 100% legible and Mild micrographia (normal for her)  MOBILITY STATUS:  using rollator currently since last exacerbation   ACTIVITY TOLERANCE: Activity tolerance: fatigues easily (worse since  October)   UPPER EXTREMITY ROM:  BUE AROM WFL's (but more difficulty in high flexion)  UPPER EXTREMITY MMT:   RUE MMT 4/5, LUE MMT 3+/5 grossly   HAND FUNCTION: Grip strength: Right: 28  lbs; Left: 20 lbs  COORDINATION: 9 Hole Peg test: Right: 30.79 sec; Left: 32.55 sec (? Mild apraxia)  SENSATION: Pins and needles sensation entire Lt side  EDEMA: none in Ue's but some in both feet/ankles  MUSCLE TONE: not tested  COGNITION: Overall cognitive status:  slower processing   VISION: Subjective report: Pt reports vision deficits has resolved - pt initially had visual deficits including Lt visual field loss and sensitivity to light Baseline vision: Wears glasses all the time Visual history:  none  VISION ASSESSMENT: Not tested   PERCEPTION: Not tested  PRAXIS: Not tested  OBSERVATIONS: Pt has not returned to IADLS since October exacerbation. However, pt has returned to work w/ no problems reported.  Overall fatigue and muscle weakness  TREATMENT DATE: 04/14/23  Reviewed theraband HEP x 10 reps each. Pt still requires cues for proper hand placement. Pt shown alternative ways to do first 2 exercises seated (vs standing)   UBE x 6 mintues, level 2 for UE strength/endurance   Pt performing environmental scanning while performing simple physical task (translating stress balls in Lt hand) during ambulation with cane in Rt hand - pt found 10/13 items (77% accuracy) on first pass in fairly quiet gym. Pt found remaining 3 items with cues to find 1/3 remaining items.     PATIENT EDUCATION: Education details: theraband HEP  Person educated: Patient Education method: Programmer, multimedia, Facilities manager, Verbal cues, and Handouts Education comprehension: verbalized understanding, returned demonstration, verbal cues required, and needs further education  HOME EXERCISE  PROGRAM: 03/24/23: coordination and putty HEP  03/31/23: sensory education 04/07/23: Bearl [place]berry HEP    GOALS: Goals reviewed with patient? Yes  SHORT TERM GOALS: Target date: 04/13/23  Independent with HEP for UE strength, grip strength, and high level coordination Baseline: Goal status: MET (may still need occasional cues for theraband)  2.  Pt to verbalize understanding with energy conservation techniques Baseline:  Goal status: MET  3.  Pt to verbalize understanding with possible A/E recommendations and task modifications prn to increase ease, safety, and independence with IADLS Baseline:  Goal status: MET   4.  Pt to perform environmental scanning with simple physical task with 90% accuracy in prep for potential return to driving Baseline:  Goal status: NOT MET (77% accuracy)   LONG TERM GOALS: Target date: 05/14/23:   Pt to return to light cooking at distant supervision level Baseline:  Goal status: INITIAL  2.  Pt to return to light cleaning tasks at home at mod I level Baseline:  Goal status: INITIAL  3.  Pt to improve grip strength bilaterally by 5 lbs or more to open tight containers/jars w/ greater ease Baseline: Rt = 28 lbs, Lt = 20 lbs Goal status: INITIAL   ASSESSMENT:  CLINICAL IMPRESSION: Patient seen today for occupational therapy treatment for brain lesion/MS. Hx includes Lupus, complicated recent medical history (see above). Pt has met 3 STG's at this time and progressing towards remaining goals. Pt with motor planning deficits. Patient currently presents below baseline level of functioning demonstrating functional deficits and impairments as noted below. Pt would benefit from skilled OT services in the outpatient setting to work on impairments as noted below to help pt return to PLOF as able.   Marland Kitchen   PERFORMANCE DEFICITS: in functional skills including ADLs, IADLs, coordination, sensation, strength, Fine motor control, mobility, balance, endurance, decreased  knowledge of use of DME, and UE functional use, cognitive skills including problem solving and safety awareness, and psychosocial skills including coping strategies.   IMPAIRMENTS: are limiting patient from ADLs, IADLs, and work.   CO-MORBIDITIES: has co-morbidities such as lupus  that affects occupational performance. Patient will benefit from skilled OT to address above impairments and improve overall function.  MODIFICATION OR ASSISTANCE TO COMPLETE EVALUATION: No modification of tasks or assist necessary to complete an evaluation.  OT OCCUPATIONAL PROFILE AND HISTORY: Detailed assessment: Review of records and additional review of physical, cognitive, psychosocial history related to current functional performance.  CLINICAL DECISION MAKING: Moderate - several treatment options, min-mod task modification necessary  REHAB POTENTIAL: Good  EVALUATION COMPLEXITY: Moderate    PLAN:  OT FREQUENCY: 1-2x/week  OT DURATION: 8 weeks  PLANNED INTERVENTIONS: 97535 self care/ADL training, 69629 therapeutic exercise, 97530 therapeutic activity, 97112 neuromuscular re-education,  97140 manual therapy, 97039 fluidotherapy, passive range of motion, functional mobility training, visual/perceptual remediation/compensation, energy conservation, coping strategies training, patient/family education, and DME and/or AE instructions  RECOMMENDED OTHER SERVICES: none at this time  CONSULTED AND AGREED WITH PLAN OF CARE: Patient  PLAN FOR NEXT SESSION: continue environmental scanning, progress towards LTG's   Sheran Lawless, OT 04/14/2023, 8:45 AM

## 2023-04-21 ENCOUNTER — Ambulatory Visit: Payer: 59 | Admitting: Occupational Therapy

## 2023-04-21 ENCOUNTER — Ambulatory Visit: Payer: 59 | Admitting: Physical Therapy

## 2023-04-21 DIAGNOSIS — R2681 Unsteadiness on feet: Secondary | ICD-10-CM

## 2023-04-21 DIAGNOSIS — M6281 Muscle weakness (generalized): Secondary | ICD-10-CM | POA: Diagnosis not present

## 2023-04-21 DIAGNOSIS — R2689 Other abnormalities of gait and mobility: Secondary | ICD-10-CM

## 2023-04-21 DIAGNOSIS — R208 Other disturbances of skin sensation: Secondary | ICD-10-CM

## 2023-04-21 DIAGNOSIS — R278 Other lack of coordination: Secondary | ICD-10-CM

## 2023-04-21 NOTE — Therapy (Signed)
 OUTPATIENT PHYSICAL THERAPY NEURO TREATMENT NOTE   Patient Name: Emily Hicks MRN: [id number] DOB:Mar 20, 1985, 38 y.o., female Today's Date: 04/21/2023   PCP: No PCP Per Pt REFERRING PROVIDER: Jerald Kief, MD  END OF SESSION:  PT End of Session - 04/21/23 0803     Visit Number 6    Number of Visits 9    Date for PT Re-Evaluation 05/15/23    Authorization Type UHC    Authorization - Number of Visits 20    PT Start Time 0803   pt in restroom   PT Stop Time 0845    PT Time Calculation (min) 42 min    Equipment Utilized During Treatment Gait belt    Activity Tolerance Patient tolerated treatment well    Behavior During Therapy WFL for tasks assessed/performed                 Past Medical History:  Diagnosis Date   Cervical intraepithelial neoplasia III    Gallstones    GERD (gastroesophageal reflux disease)    only prn OTC occasionally   History of blood transfusion 2020   Lupus dx 2020   Multiple sclerosis (HCC) dx 2020   Numbness and tingling of left leg    Past Surgical History:  Procedure Laterality Date   CERVICAL CONIZATION W/BX N/A 01/16/2020   Procedure: CERVICAL CONE BIOPSY, ENDOCERVICAL CURETTAGE;  Surgeon: Theresia Majors, MD;  Location: [facility] [place];  Service: Gynecology;  Laterality: N/A;  request 7:30am OR start in [place] Gyn block requests 45 minutes   CHOLECYSTECTOMY N/A 12/28/2013   Procedure: LAPAROSCOPIC CHOLECYSTECTOMY;  Surgeon: Claud Kelp, MD;  Location: [facility];  Service: General;  Laterality: N/A;   MUSCLE BIOPSY Right 11/06/2022   Procedure: RIGHT THIGH MUSCLE BIOPSY;  Surgeon: Andria Meuse, MD;  Location: [facility];  Service: General;  Laterality: Right;   Patient Active Problem List   Diagnosis Date Noted   Rhabdomyolysis 11/05/2022   AKI (acute kidney injury) (HCC) 11/04/2022   Recurrent idiopathic pericarditis 08/04/2022   Complicated UTI (urinary tract infection) 06/16/2022   Chest pain 06/13/2021    Sepsis (HCC) 06/13/2021   Rash and nonspecific skin eruption 05/21/2020   CIN III (cervical intraepithelial neoplasia grade III) with severe dysplasia 02/09/2020   HSIL (high grade squamous intraepithelial lesion) on Pap smear of cervix 11/03/2019   Cervical cancer screening 09/30/2019   Essential hypertension 09/19/2018   SIRS (systemic inflammatory response syndrome) (HCC) 08/16/2018   SLE (systemic lupus erythematosus related syndrome) (HCC) 08/16/2018   Pancytopenia (HCC)    Anxiety and depression    Elevated liver enzymes 08/02/2018   Normocytic anemia    Tachycardia 07/16/2018   Multiple sclerosis (HCC) 03/31/2018   Common migraine 08/26/2017   Chondromalacia, patella, left 06/24/2017   Healthcare maintenance 12/20/2015   Contraceptive management 10/11/2014   Hidradenitis suppurativa of left axilla 06/07/2013   Dysplasia of cervix, low grade (CIN 1) 07/03/2011   Obesity, Class I, BMI 30.0-34.9 (see actual BMI) 05/14/2011    ONSET DATE: 02-23-23  REFERRING DIAG:  Diagnosis  G93.9 (ICD-10-CM) - Brain lesion  G35 (ICD-10-CM) - Multiple sclerosis (HCC)    THERAPY DIAG:  Other abnormalities of gait and mobility  Muscle weakness (generalized)  Unsteadiness on feet  Rationale for Evaluation and Treatment: Rehabilitation  SUBJECTIVE:  SUBJECTIVE STATEMENT:   Pt denies any acute changes or falls since last visit, reports no pain today.  Pt accompanied by: self  PERTINENT HISTORY: Diagnosed with MS and Lupus in 2020:  per chart note  "38 y.o. female with past medical history significant for multiple sclerosis, lupus, GERD, cervical intraepithelial neoplasia, cholelithiasis who presented to [facility] ED Pt  on 1/28 with 2-week history of progressive generalized weakness, malaise,  headache. Also endorsed nasal congestion, nausea, nosebleeds. Currently on Plaquenil and azathioprine for her lupus. Followed by neurology outpatient, Dr. Epimenio Foot but currently not on any additional disease modifying therapy for her MS currently. Sister also endorses that she has been having a hard time "gripping" items. She has been taking Excedrin without much relief regarding her headaches. Denies any urinary symptoms, no visual changes, no fever, no chills, no rigors, no muscle aches."  Brain lesion, migraines, rhabdomyolysis, tachycardia  PAIN: Pt reports no true pain  Are you having pain?  "Uncomfortable in my lower body area"  PRECAUTIONS: Fall  RED FLAGS: None   WEIGHT BEARING RESTRICTIONS: No  FALLS: Has patient fallen in last 6 months? Yes. Number of falls 1- inside her home - occurred late September  LIVING ENVIRONMENT: Lives with: lives with their family Lives in: House/apartment Stairs: Yes: External: 2 steps; on left going up Has following equipment at home: Walker - 4 wheeled, shower chair, and bed side commode  PLOF: Independent - pt has returned to work (accounting) - is currently remotely from home but plans to return to office when able: is not driving currently   PATIENT GOALS: wants to improve balance and increase LE strength  OBJECTIVE:  Note: Objective measures were completed at Evaluation unless otherwise noted.  DIAGNOSTIC FINDINGS: MRI brain with and without contrast with multifocal regions of cortical diffusion restriction associated with contrast-enhancement bilateral insular regions, right occipital lobe, left cerebellum.  Imaging findings concerning for inflammatory vs CNS vasculitis vs CVA vs lupus cerebritis vs tumefactive MS vs infection.  CT angiogram head/neck with no large vessel occlusion, no stenosis or other acute vascular abnormality, no evidence for vasculitis, multiple prominent cervical, axillary, mediastinal lymph nodes, few scattered pulmonary  nodules.  MR T/L-spine with T2 hyperintense foci cervical spine cord C4-5, C6/7 consistent with known MS, no abnormal enhancement to suggest active demyelination, no abnormal spinal cord signal or enhancement in the thoracic spine, epidural fluid collection thoracic spine likely from recent LP.  Underwent lumbar puncture 02/24/2023 by IR with initial fluid analysis not consistent with infection; meningitis/encephalitis panel negative.  EEG with generalized and maximal right parieto-occipital slowing suggestive of cortical dysfunction likely secondary to underlying structural normality, no seizure/epileptiform discharges noted.  COGNITION: Overall cognitive status: Within functional limits for tasks assessed   SENSATION: Pt reports numbness & tingling in LE's - reports this was occurring prior to recent hospitalization  COORDINATION: WFL's    POSTURE: rounded shoulders and forward head  LOWER EXTREMITY ROM:   WFL's bil. LE's    LOWER EXTREMITY MMT:    MMT Right Eval Left Eval  Hip flexion 4 3+  Hip extension 3- 2+  Hip abduction    Hip adduction    Hip internal rotation    Hip external rotation    Knee flexion 3+ 3  Knee extension 4 4  Ankle dorsiflexion 4 4  Ankle plantarflexion    Ankle inversion    Ankle eversion    (Blank rows = not tested)  BED MOBILITY:  Modified independent  TRANSFERS:  Assistive device utilized: None  Sit to stand: Modified independence Stand to sit: Modified independence   STAIRS:  TBA   GAIT: Gait pattern: step through pattern Distance walked: 61' Assistive device utilized: Environmental consultant - 4 wheeled Level of assistance: Modified independence Comments: no device for TUG  FUNCTIONAL TESTS:  5 times sit to stand: 38.09 secs without UE support from mat Timed up and go (TUG): 29.69 secs without device 10 meter walk test: 18.72 secs = 1.75 ft/sec with rollator Pt stood for 1" unsupported with SBA                                                                                                                              TREATMENT   NMR To work on functional LE strengthening: Spanish squats 2 x 10 reps with orange resistance band V/c to keep weight in midline vs increased weight shift to the R Resisted 6" step taps with orange resistance band 2 x 10 reps B In // bars with BUE support while wearing 4# ankle weights: Alt L/R 1" foam beam forwards/backwards step overs 2 x 10 reps B L/R lateral 1" foam beam step overs 2 x 10 reps B Gait x 115 ft with 4# ankle weights followed by gait x 115 ft no weights Gait with alt L/R gumdrop taps, difficulty with SLS on LLE due to quad fatigue   PATIENT EDUCATION: Education details: continue HEP Person educated: Patient Education method: Medical illustrator Education comprehension: verbalized understanding and returned demonstration  HOME EXERCISE PROGRAM: Access Code: [email or web address] URL: [email or web address] Date: 03/16/2023 Prepared by: Maebelle Munroe  Exercises - Supine Bridge  - 1 x daily - 7 x weekly - 1 sets - 10 reps - 5 sec hold - Clamshell  - 1 x daily - 7 x weekly - 1 sets - 10 reps - 3-5 sec hold - Sit to Stand  - 1 x daily - 7 x weekly - 1 sets - 10 reps - Standing Hip Extension with Counter Support  - 1 x daily - 7 x weekly - 3 sets - 10 reps - Seated Hamstring Curl with Anchored Resistance  - 1 x daily - 7 x weekly - 1 sets - 10 reps - Hooklying Single Knee to Chest  - 1 x daily - 7 x weekly - 1 sets - 10 reps - Single Knee to Chest Stretch  - 1 x daily - 7 x weekly - 1 sets - 2 reps - 15 sec hold - Seated Knee Extension with Resistance  - 1 x daily - 7 x weekly - 1 sets - 10 reps - 5 sec hold hold  GOALS: Goals reviewed with patient? Yes  SHORT TERM GOALS: Target date: 04-17-23   Pt will increase Berg balance test score by at least 5 points to demo improved balance and reduced fall risk. Baseline: 44/56 Goal status: MET  2.  Improve TUG score to </=  24 secs without  use of device to demo improved mobility and reduced fall risk. Baseline: 29.69 secs without device; 21.25 no AD Goal status: MET  3.  Improve 5x sit to stand score to </= 30 secs without UE support from high/low mat table. Baseline: 38.09 secs without UE support; 18.1s no UE Goal status: MET  4.  Increase gait velocity to >/= 2.2 ft/sec with rollator for increased gait efficiency. Baseline: 18.72 secs = 1.75 ft/sec with rollator; 1.23ft/s or .73m/s no AD Goal status: IN PROGRESS  5.  Independent in HEP for bil. LE strengthening. Baseline: independent  Goal status: MET  6.  Pt will ambulate 115' without device with SBA on flat, even surface.  Baseline: 115' + clinic distances no AD- distant supervision Goal status: MET   LONG TERM GOALS: Target date: 05-15-23  Pt will increase Berg balance test score by at least 10 points to demo improved balance and reduced fall risk. Baseline:  Goal status: INITIAL  2.   Improve TUG score to </= 19 secs without use of device to demo improved mobility and reduced fall risk. Baseline: 29.69 secs without device Goal status: INITIAL  3.   Improve 5x sit to stand score to </= 30 secs without UE support from high/low mat table. Baseline: 38.09 secs without UE support Goal status: INITIAL  4.   Increase gait velocity to >/= 2.6 ft/sec with rollator for increased gait efficiency. Baseline: 18.72 secs = 1.75 ft/sec with rollator Goal status: INITIAL  5.  Pt will ambulate 350' without device with SBA on flat, even surface.  Baseline:  Goal status: INITIAL  6.  Independent in updated HEP for bil. LE strengthening. Baseline:  Goal status: INITIAL  ASSESSMENT:  CLINICAL IMPRESSION: Emphasis of skilled PT session on working on functional LE strengthening in order to address L>R LE weakness and gait impairments. Pt exhibits decreased balance and stability in SLS on her LLE with onset of fatigue. Pt continues to benefit from skilled  PT services to work on increased balance in order to decrease her fall risk and increase her safety and independence with functional mobility. Continue POC.   OBJECTIVE IMPAIRMENTS: Abnormal gait, decreased balance, decreased endurance, decreased strength, and impaired tone.   ACTIVITY LIMITATIONS: carrying, lifting, bending, standing, squatting, stairs, transfers, and locomotion level  PARTICIPATION LIMITATIONS: meal prep, cleaning, laundry, driving, shopping, community activity, and occupation  PERSONAL FACTORS: Time since onset of injury/illness/exacerbation and 1-2 comorbidities: MS and Lupus  are also affecting patient's functional outcome.   REHAB POTENTIAL: Good  CLINICAL DECISION MAKING: Evolving/moderate complexity  EVALUATION COMPLEXITY: Moderate  PLAN:  PT FREQUENCY: 1-2x/week  PT DURATION: 8 weeks  PLANNED INTERVENTIONS: 97110-Therapeutic exercises, 97530- Therapeutic activity, [id number]- Neuromuscular re-education, [phone]- Self Care, 69629- Gait training, [phone]- Aquatic Therapy, Patient/Family education, Balance training, and Stair training  PLAN FOR NEXT SESSION: cont with gait and balance training   Peter Congo, PT Peter Congo, PT, DPT, CSRS   04/21/2023, 8:46 AM

## 2023-04-21 NOTE — Therapy (Signed)
 OUTPATIENT OCCUPATIONAL THERAPY NEURO TREATMENT  Patient Name: Emily Hicks MRN: [id number] DOB:[birth year], 38 y.o., female Today's Date: 04/21/2023  PCP: None yet.  (Dr. Epimenio Foot neurologist) REFERRING PROVIDER: Jerald Kief, MD  END OF SESSION:  OT End of Session - 04/21/23 0848     Visit Number 6    Number of Visits 9    Date for OT Re-Evaluation 05/14/23    Authorization Type [employer], VL: 20 each discipline, no auth required    OT Start Time 0847    OT Stop Time 0930    OT Time Calculation (min) 43 min    Activity Tolerance Patient tolerated treatment well    Behavior During Therapy WFL for tasks assessed/performed;Flat affect             Past Medical History:  Diagnosis Date   Cervical intraepithelial neoplasia III    Gallstones    GERD (gastroesophageal reflux disease)    only prn OTC occasionally   History of blood transfusion 2020   Lupus dx 2020   Multiple sclerosis (HCC) dx 2020   Numbness and tingling of left leg    Past Surgical History:  Procedure Laterality Date   CERVICAL CONIZATION W/BX N/A 01/16/2020   Procedure: CERVICAL CONE BIOPSY, ENDOCERVICAL CURETTAGE;  Surgeon: Theresia Majors, MD;  Location: [facility];  Service: Gynecology;  Laterality: N/A;  request 7:30am OR start in [place] Gyn block requests 45 minutes   CHOLECYSTECTOMY N/A 12/28/2013   Procedure: LAPAROSCOPIC CHOLECYSTECTOMY;  Surgeon: Claud Kelp, MD;  Location: [facility];  Service: General;  Laterality: N/A;   MUSCLE BIOPSY Right 11/06/2022   Procedure: RIGHT THIGH MUSCLE BIOPSY;  Surgeon: Andria Meuse, MD;  Location: [facility];  Service: General;  Laterality: Right;   Patient Active Problem List   Diagnosis Date Noted   Rhabdomyolysis 11/05/2022   AKI (acute kidney injury) (HCC) 11/04/2022   Recurrent idiopathic pericarditis 08/04/2022   Complicated UTI (urinary tract infection) 06/16/2022   Chest pain 06/13/2021   Sepsis (HCC) 06/13/2021   Rash and  nonspecific skin eruption 05/21/2020   CIN III (cervical intraepithelial neoplasia grade III) with severe dysplasia 02/09/2020   HSIL (high grade squamous intraepithelial lesion) on Pap smear of cervix 11/03/2019   Cervical cancer screening 09/30/2019   Essential hypertension 09/19/2018   SIRS (systemic inflammatory response syndrome) (HCC) 08/16/2018   SLE (systemic lupus erythematosus related syndrome) (HCC) 08/16/2018   Pancytopenia (HCC)    Anxiety and depression    Elevated liver enzymes 08/02/2018   Normocytic anemia    Tachycardia 07/16/2018   Multiple sclerosis (HCC) 03/31/2018   Common migraine 08/26/2017   Chondromalacia, patella, left 06/24/2017   Healthcare maintenance 12/20/2015   Contraceptive management 10/11/2014   Hidradenitis suppurativa of left axilla 06/07/2013   Dysplasia of cervix, low grade (CIN 1) 07/03/2011   Obesity, Class I, BMI 30.0-34.9 (see actual BMI) 05/14/2011    ONSET DATE: 03/02/2023 (referral date)   REFERRING DIAG: G93.9 (ICD-10-CM) - Brain lesion G35 (ICD-10-CM) - Multiple sclerosis (HCC)  THERAPY DIAG:  Muscle weakness (generalized)  Unsteadiness on feet  Other lack of coordination  Other disturbances of skin sensation  Rationale for Evaluation and Treatment: Rehabilitation  SUBJECTIVE:   SUBJECTIVE STATEMENT: My knee is a little stiff but better. Still able to style hair Pt accompanied by: self  PERTINENT HISTORY: Hospital ED on 1/20 with 2-week history of progressive generalized weakness, malaise, headache. Transferred to [facility] 1/20 for neurological work up. MRI reveals Multifocal regions of  predominantly cortical diffusion restriction with associated contrast enhancement, involving the bilateral insular regions, right occipital lobe, and left cerebellum. EEG suggestive cortical dysfunction arising from right parieto-occipital likely secondary to underlying structural abnormality. 1/21 underwent LP  PMH: multiple sclerosis (diagnosed  2020), lupus (diagnosed 2020), GERD, cervical intraepithelial neoplasia, cholelithiasis   Imaging findings concerning for inflammatory vs CNS vasculitis vs CVA vs lupus cerebritis vs tumefactive MS vs infection  PRECAUTIONS: Fall  WEIGHT BEARING RESTRICTIONS: No  PAIN:  Are you having pain?  Pt reports she is uncomfortable in lower back and Lt knee  FALLS: Has patient fallen in last 6 months? Yes. 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  HAND DOMINANCE: Right  ADLs: Transfers/ambulation related to ADLs: using rollator currently but only needs with exacerbations Eating: mod I  Grooming: mod I (fatigues with styling hair)  UB Dressing: slower but mod I  LB Dressing: slower but mod I  Toileting: mod I - difficulty w/ lower toilets Bathing: mod I with shower chair Tub Shower transfers: mod I  Equipment: Shower seat with back and 1 small grab bar  Pt back to work full time and reports typing is doing fine  IADLs: Shopping: when pt is doing well, pt does. Husband has been doing since last October Light housekeeping: pt does light cleaning normally but hasn't since October Meal Prep: light cooking but not since October Community mobility: pt currently not driving since January hospitalization Medication management: mod I w/ pillbox (pt loads)  Financial management: mod I  Handwriting: 100% legible and Mild micrographia (normal for her)  MOBILITY STATUS:  using rollator currently since last exacerbation   ACTIVITY TOLERANCE: Activity tolerance: fatigues easily (worse since  October)   UPPER EXTREMITY ROM:  BUE AROM WFL's (but more difficulty in high flexion)  UPPER EXTREMITY MMT:   RUE MMT 4/5, LUE MMT 3+/5 grossly   HAND FUNCTION: Grip strength: Right: 28  lbs; Left: 20 lbs  COORDINATION: 9 Hole Peg test: Right: 30.79 sec; Left: 32.55 sec (? Mild apraxia)  SENSATION: Pins and needles sensation entire Lt side  EDEMA: none in Ue's but some in both feet/ankles  MUSCLE TONE: not tested  COGNITION: Overall cognitive status:  slower processing   VISION: Subjective report: Pt reports vision deficits has resolved - pt initially had visual deficits including Lt visual field loss and sensitivity to light Baseline vision: Wears glasses all the time Visual history:  none  VISION ASSESSMENT: Not tested   PERCEPTION: Not tested  PRAXIS: Not tested  OBSERVATIONS: Pt has not returned to IADLS since October exacerbation. However, pt has returned to work w/ no problems reported.  Overall fatigue and muscle weakness  TREATMENT DATE: 04/21/23  Assessed progress towards LTG's (see below) - pt with significant improvements in grip strength.   Discussed safety and fall prevention with kitchen tasks including cooking tasks and sweeping. Therapist demo safety with getting things in/out of refrigerator, out of low cabinets, etc. And then had pt return demo.  Pt not quite ready for sweeping d/t balance deficits but did show pt ways to prevent falls including: not sweeping too far away (getting closer) and ways to pick up dirt/mess w/o bending over including: sitting to sweep up dirt or using stand up dustpan.   UBE x 3 mintues, level 3 for UE strength/endurance   Pt performing environmental scanning down quiet hallway while performing simple physical task (translating stress balls in Lt hand) during ambulation with cane in Rt hand - pt found  all items (100% accuracy) on first pass      PATIENT EDUCATION: Education details: theraband HEP  Person educated: Patient Education method: Explanation, Demonstration, Verbal cues, and Handouts Education comprehension: verbalized understanding, returned demonstration, verbal cues required, and needs further education  HOME EXERCISE PROGRAM: 03/24/23: coordination and putty HEP  03/31/23: sensory education 04/07/23: Bearl [place]berry HEP    GOALS: Goals reviewed with patient? Yes  SHORT TERM GOALS: Target date: 04/13/23  Independent with HEP for UE strength, grip strength, and high level coordination Baseline: Goal status: MET (may still need occasional cues for theraband)  2.  Pt to verbalize understanding with energy conservation techniques Baseline:  Goal status: MET  3.  Pt to verbalize understanding with possible A/E recommendations and task modifications prn to increase ease, safety, and independence with IADLS Baseline:  Goal status: MET   4.  Pt to perform environmental scanning with simple physical task with 90% accuracy in prep for potential return to driving Baseline:  Goal status: MET 04/21/23 (100% accuracy)   LONG TERM GOALS: Target date: 05/14/23:   Pt to return to light cooking at distant supervision level Baseline:  Goal status: MET  2.  Pt to return to light cleaning tasks at home at mod I level Baseline:  Goal status: IN PROGRESS (washing dishes, laundry)  3.  Pt to improve grip strength bilaterally by 5 lbs or more to open tight containers/jars w/ greater ease Baseline: Rt = 28 lbs, Lt = 20 lbs Goal status: MET (04/21/23: Rt = 52.2 lbs, 49.3 lbs)    ASSESSMENT:  CLINICAL IMPRESSION: Patient seen today for occupational therapy treatment for brain lesion/MS. Hx includes Lupus, complicated recent medical history (see above). Pt has met all STG's and 2 LTG's at this time and progressing towards remaining goal. Pt with motor planning deficits. Patient currently  presents below baseline level of functioning demonstrating functional deficits and impairments as noted below. Pt would benefit from skilled OT services in the outpatient setting to work on impairments as noted below to help pt return to PLOF as able.   Marland Kitchen   PERFORMANCE DEFICITS: in functional skills including ADLs, IADLs, coordination, sensation, strength, Fine motor control, mobility, balance, endurance, decreased knowledge of use of DME, and UE functional use, cognitive skills including problem solving and safety awareness, and psychosocial skills including coping strategies.   IMPAIRMENTS: are limiting patient from ADLs, IADLs, and work.   CO-MORBIDITIES: has co-morbidities such as lupus  that affects occupational performance. Patient will benefit from skilled OT to address above impairments and improve overall function.  MODIFICATION OR ASSISTANCE TO COMPLETE EVALUATION: No modification of tasks or assist necessary to complete an evaluation.  OT OCCUPATIONAL  PROFILE AND HISTORY: Detailed assessment: Review of records and additional review of physical, cognitive, psychosocial history related to current functional performance.  CLINICAL DECISION MAKING: Moderate - several treatment options, min-mod task modification necessary  REHAB POTENTIAL: Good  EVALUATION COMPLEXITY: Moderate    PLAN:  OT FREQUENCY: 1-2x/week  OT DURATION: 8 weeks  PLANNED INTERVENTIONS: 97535 self care/ADL training, 21308 therapeutic exercise, 97530 therapeutic activity, 97112 neuromuscular re-education, 97140 manual therapy, 97039 fluidotherapy, passive range of motion, functional mobility training, visual/perceptual remediation/compensation, energy conservation, coping strategies training, patient/family education, and DME and/or AE instructions  RECOMMENDED OTHER SERVICES: none at this time  CONSULTED AND AGREED WITH PLAN OF CARE: Patient  PLAN FOR NEXT SESSION: dynamic standing balance, work towards IADLS  (LTG #2), continue environmental scanning in busier environment   Sheran Lawless, OT 04/21/2023, 8:49 AM

## 2023-04-28 ENCOUNTER — Ambulatory Visit: Payer: 59 | Admitting: Occupational Therapy

## 2023-04-28 ENCOUNTER — Ambulatory Visit: Payer: 59 | Admitting: Physical Therapy

## 2023-04-28 ENCOUNTER — Encounter: Payer: Self-pay | Admitting: Occupational Therapy

## 2023-04-28 VITALS — BP 174/126

## 2023-04-28 DIAGNOSIS — R208 Other disturbances of skin sensation: Secondary | ICD-10-CM

## 2023-04-28 DIAGNOSIS — R2681 Unsteadiness on feet: Secondary | ICD-10-CM

## 2023-04-28 DIAGNOSIS — R2689 Other abnormalities of gait and mobility: Secondary | ICD-10-CM

## 2023-04-28 DIAGNOSIS — M6281 Muscle weakness (generalized): Secondary | ICD-10-CM

## 2023-04-28 DIAGNOSIS — R278 Other lack of coordination: Secondary | ICD-10-CM

## 2023-04-28 NOTE — Therapy (Unsigned)
 OUTPATIENT PHYSICAL THERAPY NEURO TREATMENT NOTE   Patient Name: Emily Hicks MRN: [id number] DOB:Jun 07, 1985, 38 y.o., female Today's Date: 04/29/2023   PCP: No PCP Per Pt REFERRING PROVIDER: Jerald Kief, MD  END OF SESSION:  PT End of Session - 04/29/23 1103     Visit Number 7    Number of Visits 9    Date for PT Re-Evaluation 05/15/23    Authorization Type [employer]    Authorization - Visit Number 7    Authorization - Number of Visits 20    PT Start Time 0801    PT Stop Time 0845    PT Time Calculation (min) 44 min    Equipment Utilized During Treatment Gait belt    Activity Tolerance Patient tolerated treatment well    Behavior During Therapy WFL for tasks assessed/performed                  Past Medical History:  Diagnosis Date   Cervical intraepithelial neoplasia III    Gallstones    GERD (gastroesophageal reflux disease)    only prn OTC occasionally   History of blood transfusion 2020   Lupus dx 2020   Multiple sclerosis (HCC) dx 2020   Numbness and tingling of left leg    Past Surgical History:  Procedure Laterality Date   CERVICAL CONIZATION W/BX N/A 01/16/2020   Procedure: CERVICAL CONE BIOPSY, ENDOCERVICAL CURETTAGE;  Surgeon: Theresia Majors, MD;  Location: [facility];  Service: Gynecology;  Laterality: N/A;  request 7:30am OR start in [place] Gyn block requests 45 minutes   CHOLECYSTECTOMY N/A 12/28/2013   Procedure: LAPAROSCOPIC CHOLECYSTECTOMY;  Surgeon: Claud Kelp, MD;  Location: [facility];  Service: General;  Laterality: N/A;   MUSCLE BIOPSY Right 11/06/2022   Procedure: RIGHT THIGH MUSCLE BIOPSY;  Surgeon: Andria Meuse, MD;  Location: [facility];  Service: General;  Laterality: Right;   Patient Active Problem List   Diagnosis Date Noted   Rhabdomyolysis 11/05/2022   AKI (acute kidney injury) (HCC) 11/04/2022   Recurrent idiopathic pericarditis 08/04/2022   Complicated UTI (urinary tract infection) 06/16/2022    Chest pain 06/13/2021   Sepsis (HCC) 06/13/2021   Rash and nonspecific skin eruption 05/21/2020   CIN III (cervical intraepithelial neoplasia grade III) with severe dysplasia 02/09/2020   HSIL (high grade squamous intraepithelial lesion) on Pap smear of cervix 11/03/2019   Cervical cancer screening 09/30/2019   Essential hypertension 09/19/2018   SIRS (systemic inflammatory response syndrome) (HCC) 08/16/2018   SLE (systemic lupus erythematosus related syndrome) (HCC) 08/16/2018   Pancytopenia (HCC)    Anxiety and depression    Elevated liver enzymes 08/02/2018   Normocytic anemia    Tachycardia 07/16/2018   Multiple sclerosis (HCC) 03/31/2018   Common migraine 08/26/2017   Chondromalacia, patella, left 06/24/2017   Healthcare maintenance 12/20/2015   Contraceptive management 10/11/2014   Hidradenitis suppurativa of left axilla 06/07/2013   Dysplasia of cervix, low grade (CIN 1) 07/03/2011   Obesity, Class I, BMI 30.0-34.9 (see actual BMI) 05/14/2011    ONSET DATE: 02-23-23  REFERRING DIAG:  Diagnosis  G93.9 (ICD-10-CM) - Brain lesion  G35 (ICD-10-CM) - Multiple sclerosis (HCC)    THERAPY DIAG:  Other abnormalities of gait and mobility  Muscle weakness (generalized)  Unsteadiness on feet  Rationale for Evaluation and Treatment: Rehabilitation  SUBJECTIVE:  SUBJECTIVE STATEMENT:   Pt reports she is sore today - went to a concert Sunday night and had to do a lot of walking and also had to go up a lot of steps - feeling the muscle soreness today more than yesterday.  Pt reports walking more in her home without use of cane  Pt accompanied by: self  PERTINENT HISTORY: Diagnosed with MS and Lupus in 2020:  per chart note  "38 y.o. female with past medical history significant for multiple  sclerosis, lupus, GERD, cervical intraepithelial neoplasia, cholelithiasis who presented to [facility] ED Pt  on 1/28 with 2-week history of progressive generalized weakness, malaise, headache. Also endorsed nasal congestion, nausea, nosebleeds. Currently on Plaquenil and azathioprine for her lupus. Followed by neurology outpatient, Dr. Epimenio Foot but currently not on any additional disease modifying therapy for her MS currently. Sister also endorses that she has been having a hard time "gripping" items. She has been taking Excedrin without much relief regarding her headaches. Denies any urinary symptoms, no visual changes, no fever, no chills, no rigors, no muscle aches."  Brain lesion, migraines, rhabdomyolysis, tachycardia  PAIN: Pt reports no true pain  Are you having pain?  "Uncomfortable in my lower body area"  PRECAUTIONS: Fall  RED FLAGS: None   WEIGHT BEARING RESTRICTIONS: No  FALLS: Has patient fallen in last 6 months? Yes. Number of falls 1- inside her home - occurred late September  LIVING ENVIRONMENT: Lives with: lives with their family Lives in: House/apartment Stairs: Yes: External: 2 steps; on left going up Has following equipment at home: Walker - 4 wheeled, shower chair, and bed side commode  PLOF: Independent - pt has returned to work (accounting) - is currently remotely from home but plans to return to office when able: is not driving currently   PATIENT GOALS: wants to improve balance and increase LE strength  OBJECTIVE:  Note: Objective measures were completed at Evaluation unless otherwise noted.  DIAGNOSTIC FINDINGS: MRI brain with and without contrast with multifocal regions of cortical diffusion restriction associated with contrast-enhancement bilateral insular regions, right occipital lobe, left cerebellum.  Imaging findings concerning for inflammatory vs CNS vasculitis vs CVA vs lupus cerebritis vs tumefactive MS vs infection.  CT angiogram head/neck with  no large vessel occlusion, no stenosis or other acute vascular abnormality, no evidence for vasculitis, multiple prominent cervical, axillary, mediastinal lymph nodes, few scattered pulmonary nodules.  MR T/L-spine with T2 hyperintense foci cervical spine cord C4-5, C6/7 consistent with known MS, no abnormal enhancement to suggest active demyelination, no abnormal spinal cord signal or enhancement in the thoracic spine, epidural fluid collection thoracic spine likely from recent LP.  Underwent lumbar puncture 02/24/2023 by IR with initial fluid analysis not consistent with infection; meningitis/encephalitis panel negative.  EEG with generalized and maximal right parieto-occipital slowing suggestive of cortical dysfunction likely secondary to underlying structural normality, no seizure/epileptiform discharges noted.  COGNITION: Overall cognitive status: Within functional limits for tasks assessed   SENSATION: Pt reports numbness & tingling in LE's - reports this was occurring prior to recent hospitalization  COORDINATION: WFL's    POSTURE: rounded shoulders and forward head  LOWER EXTREMITY ROM:   WFL's bil. LE's    LOWER EXTREMITY MMT:    MMT Right Eval Left Eval  Hip flexion 4 3+  Hip extension 3- 2+  Hip abduction    Hip adduction    Hip internal rotation    Hip external rotation    Knee flexion 3+ 3  Knee extension 4 4  Ankle dorsiflexion 4 4  Ankle plantarflexion    Ankle inversion    Ankle eversion    (Blank rows = not tested)  BED MOBILITY:  Modified independent  TRANSFERS: Assistive device utilized: None  Sit to stand: Modified independence Stand to sit: Modified independence   STAIRS:  TBA   GAIT: Gait pattern: step through pattern Distance walked: 32' Assistive device utilized: Environmental consultant - 4 wheeled Level of assistance: Modified independence Comments: no device for TUG  FUNCTIONAL TESTS:  5 times sit to stand: 38.09 secs without UE support from mat Timed  up and go (TUG): 29.69 secs without device 10 meter walk test: 18.72 secs = 1.75 ft/sec with rollator Pt stood for 1" unsupported with SBA                                                                                                                             TREATMENT  - 04-28-23  TherAct: Functional strengthening exercises: Step up exercise LLE onto 6" step 10 reps with bil. UE support on rails for quad strengthening  Squats x 10 reps without UE support  Performed tap ups to 1st step - 5 reps RLE without UE support with CGA:  10 reps to 2nd step without UE support  Amb. Inside // bars - 10' x 2 forwards with knees flexed on tiptoes for gastroc  & quad strengthening; pt performed sideways amb. With knees flexed on tiptoes inside // bars 10' x 4 reps - no UE support used; backwards amb. On tip toes with knees flexed without UE support 10' x 2 reps - cues to keep feet separated as pt has narrow BOS with this activity    Gait:  pt gait trained 350' (3 laps around track) without device with CGA   Step training - pt negotiated 4 steps x 3 reps using Rt hand rail on 1st rep with step over step sequence; 2nd rep step by step sequence without use of hand rail;  3rd rep attempted step over step sequence without use of hand rail but was needed more with descension than with ascension due to SLS deficits   Curb negotiation - no device - 3 reps with CGA Ramp negotiation - no device with SBA - 1 rep  Pt amb. 35' tossing and catching ball for improved balance with multi-tasking with gait - 2nd rep - pt tossed/caught ball on Rt/Lt sides for increased balance with turning and with greater head turns   NeuroRe-ed: Birddog exercise on mat - lifting contralateral UE/LE 3 reps each side attempted 5 sec hold for improved core strength and stabilization  Marching inside // bars without UE support 10 rep each leg with SBA  SLS activity - touching 3 cones with each LE for improved SLS on each leg - CGA for  safety and assist with balance recovery  PATIENT EDUCATION: Education details: continue HEP Person educated: Patient Education method: Explanation and Demonstration  Education comprehension: verbalized understanding and returned demonstration  HOME EXERCISE PROGRAM: Access Code: [email or web address] URL: [email or web address] Date: 03/16/2023 Prepared by: Maebelle Munroe  Exercises - Supine Bridge  - 1 x daily - 7 x weekly - 1 sets - 10 reps - 5 sec hold - Clamshell  - 1 x daily - 7 x weekly - 1 sets - 10 reps - 3-5 sec hold - Sit to Stand  - 1 x daily - 7 x weekly - 1 sets - 10 reps - Standing Hip Extension with Counter Support  - 1 x daily - 7 x weekly - 3 sets - 10 reps - Seated Hamstring Curl with Anchored Resistance  - 1 x daily - 7 x weekly - 1 sets - 10 reps - Hooklying Single Knee to Chest  - 1 x daily - 7 x weekly - 1 sets - 10 reps - Single Knee to Chest Stretch  - 1 x daily - 7 x weekly - 1 sets - 2 reps - 15 sec hold - Seated Knee Extension with Resistance  - 1 x daily - 7 x weekly - 1 sets - 10 reps - 5 sec hold hold  GOALS: Goals reviewed with patient? Yes  SHORT TERM GOALS: Target date: 04-17-23   Pt will increase Berg balance test score by at least 5 points to demo improved balance and reduced fall risk. Baseline: 44/56 Goal status: MET  2.  Improve TUG score to </= 24 secs without use of device to demo improved mobility and reduced fall risk. Baseline: 29.69 secs without device; 21.25 no AD Goal status: MET  3.  Improve 5x sit to stand score to </= 30 secs without UE support from high/low mat table. Baseline: 38.09 secs without UE support; 18.1s no UE Goal status: MET  4.  Increase gait velocity to >/= 2.2 ft/sec with rollator for increased gait efficiency. Baseline: 18.72 secs = 1.75 ft/sec with rollator; 1.53ft/s or .26m/s no AD Goal status: IN PROGRESS  5.  Independent in HEP for bil. LE strengthening. Baseline: independent  Goal status: MET  6.  Pt  will ambulate 115' without device with SBA on flat, even surface.  Baseline: 115' + clinic distances no AD- distant supervision Goal status: MET   LONG TERM GOALS: Target date: 05-15-23  Pt will increase Berg balance test score by at least 10 points to demo improved balance and reduced fall risk. Baseline:  Goal status: INITIAL  2.   Improve TUG score to </= 19 secs without use of device to demo improved mobility and reduced fall risk. Baseline: 29.69 secs without device Goal status: INITIAL  3.   Improve 5x sit to stand score to </= 30 secs without UE support from high/low mat table. Baseline: 38.09 secs without UE support Goal status: INITIAL  4.   Increase gait velocity to >/= 2.6 ft/sec with rollator for increased gait efficiency. Baseline: 18.72 secs = 1.75 ft/sec with rollator Goal status: INITIAL  5.  Pt will ambulate 350' without device with SBA on flat, even surface.  Baseline:  Goal status: INITIAL  6.  Independent in updated HEP for bil. LE strengthening. Baseline:  Goal status: INITIAL  ASSESSMENT:  CLINICAL IMPRESSION: PT session focused on LE strengthening and balance training to improve SLS on each leg.  Pt's LLE remains slightly weaker than RLE but pt demonstrates improved gait pattern with less gait deviations exhibited and also demonstrates increased gait velocity without use of SBQC.  Improved activity tolerance noted  in today's session with less rest breaks needed.  Pt is progressing well towards LTG's.  Continue POC.   OBJECTIVE IMPAIRMENTS: Abnormal gait, decreased balance, decreased endurance, decreased strength, and impaired tone.   ACTIVITY LIMITATIONS: carrying, lifting, bending, standing, squatting, stairs, transfers, and locomotion level  PARTICIPATION LIMITATIONS: meal prep, cleaning, laundry, driving, shopping, community activity, and occupation  PERSONAL FACTORS: Time since onset of injury/illness/exacerbation and 1-2 comorbidities: MS and  Lupus  are also affecting patient's functional outcome.   REHAB POTENTIAL: Good  CLINICAL DECISION MAKING: Evolving/moderate complexity  EVALUATION COMPLEXITY: Moderate  PLAN:  PT FREQUENCY: 1-2x/week  PT DURATION: 8 weeks  PLANNED INTERVENTIONS: 97110-Therapeutic exercises, 97530- Therapeutic activity, [id number]- Neuromuscular re-education, [phone]- Self Care, 23557- Gait training, [phone]- Aquatic Therapy, Patient/Family education, Balance training, and Stair training  PLAN FOR NEXT SESSION: cont with gait and balance training   Adriene Padula, Donavan Burnet, PT Peter Congo, PT, DPT, CSRS   04/29/2023, 11:06 AM

## 2023-04-28 NOTE — Therapy (Signed)
 OUTPATIENT OCCUPATIONAL THERAPY NEURO TREATMENT  Patient Name: Emily Hicks MRN: [id number] DOB:December 11, 1985, 38 y.o., female Today's Date: 04/28/2023  PCP: None yet.  (Dr. Epimenio Foot neurologist) REFERRING PROVIDER: Jerald Kief, MD  END OF SESSION:  OT End of Session - 04/28/23 0850     Visit Number 7    Number of Visits 9    Date for OT Re-Evaluation 05/14/23    Authorization Type [employer], VL: 20 each discipline, no auth required    OT Start Time 0848    OT Stop Time 1000    OT Time Calculation (min) 72 min    Activity Tolerance Patient tolerated treatment well    Behavior During Therapy WFL for tasks assessed/performed;Flat affect             Past Medical History:  Diagnosis Date   Cervical intraepithelial neoplasia III    Gallstones    GERD (gastroesophageal reflux disease)    only prn OTC occasionally   History of blood transfusion 2020   Lupus dx 2020   Multiple sclerosis (HCC) dx 2020   Numbness and tingling of left leg    Past Surgical History:  Procedure Laterality Date   CERVICAL CONIZATION W/BX N/A 01/16/2020   Procedure: CERVICAL CONE BIOPSY, ENDOCERVICAL CURETTAGE;  Surgeon: Theresia Majors, MD;  Location: [facility];  Service: Gynecology;  Laterality: N/A;  request 7:30am OR start in [place] Gyn block requests 45 minutes   CHOLECYSTECTOMY N/A 12/28/2013   Procedure: LAPAROSCOPIC CHOLECYSTECTOMY;  Surgeon: Claud Kelp, MD;  Location: [facility];  Service: General;  Laterality: N/A;   MUSCLE BIOPSY Right 11/06/2022   Procedure: RIGHT THIGH MUSCLE BIOPSY;  Surgeon: Andria Meuse, MD;  Location: [facility];  Service: General;  Laterality: Right;   Patient Active Problem List   Diagnosis Date Noted   Rhabdomyolysis 11/05/2022   AKI (acute kidney injury) (HCC) 11/04/2022   Recurrent idiopathic pericarditis 08/04/2022   Complicated UTI (urinary tract infection) 06/16/2022   Chest pain 06/13/2021   Sepsis (HCC) 06/13/2021   Rash and  nonspecific skin eruption 05/21/2020   CIN III (cervical intraepithelial neoplasia grade III) with severe dysplasia 02/09/2020   HSIL (high grade squamous intraepithelial lesion) on Pap smear of cervix 11/03/2019   Cervical cancer screening 09/30/2019   Essential hypertension 09/19/2018   SIRS (systemic inflammatory response syndrome) (HCC) 08/16/2018   SLE (systemic lupus erythematosus related syndrome) (HCC) 08/16/2018   Pancytopenia (HCC)    Anxiety and depression    Elevated liver enzymes 08/02/2018   Normocytic anemia    Tachycardia 07/16/2018   Multiple sclerosis (HCC) 03/31/2018   Common migraine 08/26/2017   Chondromalacia, patella, left 06/24/2017   Healthcare maintenance 12/20/2015   Contraceptive management 10/11/2014   Hidradenitis suppurativa of left axilla 06/07/2013   Dysplasia of cervix, low grade (CIN 1) 07/03/2011   Obesity, Class I, BMI 30.0-34.9 (see actual BMI) 05/14/2011    ONSET DATE: 03/02/2023 (referral date)   REFERRING DIAG: G93.9 (ICD-10-CM) - Brain lesion G35 (ICD-10-CM) - Multiple sclerosis (HCC)  THERAPY DIAG:  Unsteadiness on feet  Muscle weakness (generalized)  Other lack of coordination  Other disturbances of skin sensation  Rationale for Evaluation and Treatment: Rehabilitation  SUBJECTIVE:   SUBJECTIVE STATEMENT: No pain and no falls. My knee is behaving today Pt accompanied by: self  PERTINENT HISTORY: Hospital ED on 1/20 with 2-week history of progressive generalized weakness, malaise, headache. Transferred to [facility] 1/20 for neurological work up. MRI reveals Multifocal regions of predominantly cortical diffusion  restriction with associated contrast enhancement, involving the bilateral insular regions, right occipital lobe, and left cerebellum. EEG suggestive cortical dysfunction arising from right parieto-occipital likely secondary to underlying structural abnormality. 1/21 underwent LP  PMH: multiple sclerosis (diagnosed 2020), lupus  (diagnosed 2020), GERD, cervical intraepithelial neoplasia, cholelithiasis   Imaging findings concerning for inflammatory vs CNS vasculitis vs CVA vs lupus cerebritis vs tumefactive MS vs infection  PRECAUTIONS: Fall  WEIGHT BEARING RESTRICTIONS: No  PAIN:  Are you having pain?  Pt reports she is uncomfortable in lower back and Lt knee  FALLS: Has patient fallen in last 6 months? Yes. 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  HAND DOMINANCE: Right  ADLs: Transfers/ambulation related to ADLs: using rollator currently but only needs with exacerbations Eating: mod I  Grooming: mod I (fatigues with styling hair)  UB Dressing: slower but mod I  LB Dressing: slower but mod I  Toileting: mod I - difficulty w/ lower toilets Bathing: mod I with shower chair Tub Shower transfers: mod I  Equipment: Shower seat with back and 1 small grab bar  Pt back to work full time and reports typing is doing fine  IADLs: Shopping: when pt is doing well, pt does. Husband has been doing since last October Light housekeeping: pt does light cleaning normally but hasn't since October Meal Prep: light cooking but not since October Community mobility: pt currently not driving since January hospitalization Medication management: mod I w/ pillbox (pt loads)  Financial management: mod I  Handwriting: 100% legible and Mild micrographia (normal for her)  MOBILITY STATUS:  using rollator currently since last exacerbation   ACTIVITY TOLERANCE: Activity tolerance: fatigues easily (worse since  October)   UPPER EXTREMITY ROM:  BUE AROM WFL's (but more difficulty in high flexion)  UPPER EXTREMITY MMT:   RUE MMT 4/5, LUE MMT 3+/5 grossly   HAND FUNCTION: Grip strength: Right: 28  lbs; Left: 20 lbs  COORDINATION: 9 Hole Peg test: Right: 30.79 sec; Left: 32.55 sec (? Mild apraxia)  SENSATION: Pins and needles sensation entire Lt side  EDEMA: none in Ue's but some in both feet/ankles  MUSCLE TONE: not tested  COGNITION: Overall cognitive status:  slower processing   VISION: Subjective report: Pt reports vision deficits has resolved - pt initially had visual deficits including Lt visual field loss and sensitivity to light Baseline vision: Wears glasses all the time Visual history:  none  VISION ASSESSMENT: Not tested   PERCEPTION: Not tested  PRAXIS: Not tested  OBSERVATIONS: Pt has not returned to IADLS since October exacerbation. However, pt has returned to work w/ no problems reported.  Overall fatigue and muscle weakness  TREATMENT DATE: 04/28/23  Simulated making bed using sheet and 2 pillow cases with mat - using mat for balance prn.  Ambulating with cane Rt hand while carrying small laundry basket under Lt arm.   UBE x 5 mintues, level 3 for UE strength/endurance   Pt performing environmental scanning in busier gym while performing simple physical task (rotating ball or tossing ball Lt hand) during ambulation with cane in Rt hand - pt found 15/16 items on first pass (94% accuracy) on first pass and found remaining item on 2nd pass  Pt requested BP be taken at end of session reporting it was higher than normal this morning.  BP = 182/128 at end of session.  BP after 2 minutes w/ slightly larger cuff 174/126 Pt did have headache this morning but subsided, denies symptoms currently.  Called PCP and after several attempts - able to get  her an appointment at 10:30 am today. Pt was escorted to car and she is going directly to MD appointment.  Prior to leaving, reviewed s/s of CVA - pt denied any of these symptoms and alert, answering questions appropriately.    PATIENT EDUCATION: Education details: theraband HEP  Person educated: Patient Education method: Programmer, multimedia, Facilities manager, Verbal cues, and Handouts Education comprehension: verbalized understanding, returned demonstration, verbal cues required, and needs further education  HOME EXERCISE PROGRAM: 03/24/23: coordination and putty HEP  03/31/23: sensory education 04/07/23: Bearl Mulberry HEP    GOALS: Goals reviewed with patient? Yes  SHORT TERM GOALS: Target date: 04/13/23  Independent with HEP for UE strength, grip strength, and high level coordination Baseline: Goal status: MET (may still need occasional cues for theraband)  2.  Pt to verbalize understanding with energy conservation techniques Baseline:  Goal status: MET  3.  Pt to verbalize understanding with possible A/E recommendations and task modifications prn to increase ease, safety, and independence with IADLS Baseline:  Goal status: MET   4.  Pt to perform environmental scanning with simple physical task with 90% accuracy in prep for potential return to driving Baseline:  Goal status: MET 04/21/23 (100% accuracy)   LONG TERM GOALS: Target date: 05/14/23:   Pt to return to light cooking at distant supervision level Baseline:  Goal status: MET  2.  Pt to return to light cleaning tasks at home at mod I level Baseline:  Goal status: IN PROGRESS (washing dishes, laundry)  3.  Pt to improve grip strength bilaterally by 5 lbs or more to open tight containers/jars w/ greater ease Baseline: Rt = 28 lbs, Lt = 20 lbs Goal status: MET (04/21/23: Rt = 52.2 lbs, 49.3 lbs)    ASSESSMENT:  CLINICAL IMPRESSION: Patient seen today for occupational therapy treatment for brain lesion/MS. Hx includes Lupus,  complicated recent medical history (see above). Pt has met all STG's and 2 LTG's at this time and progressing towards remaining goal. BP elevated this session however unknown/unreported until end of session. Pt with motor planning deficits. Patient currently presents below baseline level of functioning demonstrating functional deficits and impairments as noted below. Pt would benefit from skilled OT services in the outpatient setting to work on impairments as noted below to help pt return to PLOF as able.   Marland Kitchen   PERFORMANCE DEFICITS: in functional skills including ADLs, IADLs, coordination, sensation, strength, Fine motor control, mobility, balance, endurance, decreased knowledge of use of DME, and UE functional use, cognitive skills including problem solving and safety awareness, and psychosocial skills including coping strategies.   IMPAIRMENTS: are limiting patient  from ADLs, IADLs, and work.   CO-MORBIDITIES: has co-morbidities such as lupus  that affects occupational performance. Patient will benefit from skilled OT to address above impairments and improve overall function.  MODIFICATION OR ASSISTANCE TO COMPLETE EVALUATION: No modification of tasks or assist necessary to complete an evaluation.  OT OCCUPATIONAL PROFILE AND HISTORY: Detailed assessment: Review of records and additional review of physical, cognitive, psychosocial history related to current functional performance.  CLINICAL DECISION MAKING: Moderate - several treatment options, min-mod task modification necessary  REHAB POTENTIAL: Good  EVALUATION COMPLEXITY: Moderate    PLAN:  OT FREQUENCY: 1-2x/week  OT DURATION: 8 weeks  PLANNED INTERVENTIONS: 97535 self care/ADL training, 29562 therapeutic exercise, 97530 therapeutic activity, 97112 neuromuscular re-education, 97140 manual therapy, 97039 fluidotherapy, passive range of motion, functional mobility training, visual/perceptual remediation/compensation, energy  conservation, coping strategies training, patient/family education, and DME and/or AE instructions  RECOMMENDED OTHER SERVICES: none at this time  CONSULTED AND AGREED WITH PLAN OF CARE: Patient  PLAN FOR NEXT SESSION: MONITOR BP!, continue progress towards LTG #2. Anticipate d/c next session   Sheran Lawless, OT 04/28/2023, 12:20 PM

## 2023-04-29 ENCOUNTER — Encounter: Payer: Self-pay | Admitting: Physical Therapy

## 2023-05-04 ENCOUNTER — Telehealth: Payer: Self-pay | Admitting: Neurology

## 2023-05-04 MED ORDER — ALPRAZOLAM 0.5 MG PO TABS: ORAL_TABLET | ORAL | 0 refills | Status: DC

## 2023-05-04 NOTE — Addendum Note (Signed)
 Addended by: Asa Lente on: 05/04/2023 03:36 PM   Modules accepted: Orders

## 2023-05-04 NOTE — Telephone Encounter (Signed)
 She is coming to [facility] on April 8 for her MRI and asking we send her in some Xanax to take before please.

## 2023-05-04 NOTE — Addendum Note (Signed)
 Addended by: Arther Abbott on: 05/04/2023 01:59 PM   Modules accepted: Orders

## 2023-05-05 ENCOUNTER — Ambulatory Visit: Payer: 59 | Admitting: Occupational Therapy

## 2023-05-05 ENCOUNTER — Ambulatory Visit: Payer: 59 | Admitting: Physical Therapy

## 2023-05-12 ENCOUNTER — Ambulatory Visit (INDEPENDENT_AMBULATORY_CARE_PROVIDER_SITE_OTHER)

## 2023-05-12 ENCOUNTER — Ambulatory Visit: Payer: 59 | Attending: Internal Medicine | Admitting: Occupational Therapy

## 2023-05-12 ENCOUNTER — Encounter: Payer: Self-pay | Admitting: Occupational Therapy

## 2023-05-12 ENCOUNTER — Encounter: Payer: Self-pay | Admitting: Neurology

## 2023-05-12 ENCOUNTER — Ambulatory Visit: Payer: 59 | Admitting: Physical Therapy

## 2023-05-12 VITALS — BP 136/98 | HR 98

## 2023-05-12 DIAGNOSIS — M6281 Muscle weakness (generalized): Secondary | ICD-10-CM

## 2023-05-12 DIAGNOSIS — R2681 Unsteadiness on feet: Secondary | ICD-10-CM | POA: Diagnosis present

## 2023-05-12 DIAGNOSIS — I776 Arteritis, unspecified: Secondary | ICD-10-CM | POA: Diagnosis not present

## 2023-05-12 DIAGNOSIS — G35 Multiple sclerosis: Secondary | ICD-10-CM | POA: Diagnosis not present

## 2023-05-12 DIAGNOSIS — R208 Other disturbances of skin sensation: Secondary | ICD-10-CM | POA: Diagnosis present

## 2023-05-12 DIAGNOSIS — R278 Other lack of coordination: Secondary | ICD-10-CM | POA: Diagnosis present

## 2023-05-12 DIAGNOSIS — R2689 Other abnormalities of gait and mobility: Secondary | ICD-10-CM | POA: Diagnosis present

## 2023-05-12 MED ORDER — GADOBENATE DIMEGLUMINE 529 MG/ML IV SOLN
15.0000 mL | Freq: Once | INTRAVENOUS | Status: AC | PRN
Start: 2023-05-12 — End: 2023-05-12
  Administered 2023-05-12: 15 mL via INTRAVENOUS

## 2023-05-12 NOTE — Therapy (Signed)
 OUTPATIENT PHYSICAL THERAPY NEURO TREATMENT NOTE - DISCHARGE NOTE   Patient Name: DEONDREA AGUADO MRN: [id number] DOB:03-03-1985, 38 y.o., female Today's Date: 05/12/2023   PCP: No PCP Per Pt REFERRING PROVIDER: Jerald Kief, MD  PHYSICAL THERAPY DISCHARGE SUMMARY  Visits from Start of Care: 8  Current functional level related to goals / functional outcomes: Mod I   Remaining deficits: Mild balance impairments   Education / Equipment: Handout for final HEP   Patient agrees to discharge. Patient goals were partially met. Patient is being discharged due to meeting the stated rehab goals.   END OF SESSION:  PT End of Session - 05/12/23 0802     Visit Number 8    Number of Visits 9    Date for PT Re-Evaluation 05/15/23    Authorization Type [employer]    Authorization - Number of Visits 20    PT Start Time 0800    PT Stop Time 0844    PT Time Calculation (min) 44 min    Equipment Utilized During Treatment Gait belt    Activity Tolerance Patient tolerated treatment well    Behavior During Therapy WFL for tasks assessed/performed                   Past Medical History:  Diagnosis Date   Cervical intraepithelial neoplasia III    Gallstones    GERD (gastroesophageal reflux disease)    only prn OTC occasionally   History of blood transfusion 2020   Lupus dx 2020   Multiple sclerosis (HCC) dx 2020   Numbness and tingling of left leg    Past Surgical History:  Procedure Laterality Date   CERVICAL CONIZATION W/BX N/A 01/16/2020   Procedure: CERVICAL CONE BIOPSY, ENDOCERVICAL CURETTAGE;  Surgeon: Theresia Majors, MD;  Location: [facility];  Service: Gynecology;  Laterality: N/A;  request 7:30am OR start in [place] Gyn block requests 45 minutes   CHOLECYSTECTOMY N/A 12/28/2013   Procedure: LAPAROSCOPIC CHOLECYSTECTOMY;  Surgeon: Claud Kelp, MD;  Location: [facility];  Service: General;  Laterality: N/A;   MUSCLE BIOPSY Right 11/06/2022    Procedure: RIGHT THIGH MUSCLE BIOPSY;  Surgeon: Andria Meuse, MD;  Location: [facility];  Service: General;  Laterality: Right;   Patient Active Problem List   Diagnosis Date Noted   Rhabdomyolysis 11/05/2022   AKI (acute kidney injury) (HCC) 11/04/2022   Recurrent idiopathic pericarditis 08/04/2022   Complicated UTI (urinary tract infection) 06/16/2022   Chest pain 06/13/2021   Sepsis (HCC) 06/13/2021   Rash and nonspecific skin eruption 05/21/2020   CIN III (cervical intraepithelial neoplasia grade III) with severe dysplasia 02/09/2020   HSIL (high grade squamous intraepithelial lesion) on Pap smear of cervix 11/03/2019   Cervical cancer screening 09/30/2019   Essential hypertension 09/19/2018   SIRS (systemic inflammatory response syndrome) (HCC) 08/16/2018   SLE (systemic lupus erythematosus related syndrome) (HCC) 08/16/2018   Pancytopenia (HCC)    Anxiety and depression    Elevated liver enzymes 08/02/2018   Normocytic anemia    Tachycardia 07/16/2018   Multiple sclerosis (HCC) 03/31/2018   Common migraine 08/26/2017   Chondromalacia, patella, left 06/24/2017   Healthcare maintenance 12/20/2015   Contraceptive management 10/11/2014   Hidradenitis suppurativa of left axilla 06/07/2013   Dysplasia of cervix, low grade (CIN 1) 07/03/2011   Obesity, Class I, BMI 30.0-34.9 (see actual BMI) 05/14/2011    ONSET DATE: 02-23-23  REFERRING DIAG:  Diagnosis  G93.9 (ICD-10-CM) - Brain lesion  G35 (ICD-10-CM) -  Multiple sclerosis (HCC)    THERAPY DIAG:  Unsteadiness on feet  Muscle weakness (generalized)  Other abnormalities of gait and mobility  Rationale for Evaluation and Treatment: Rehabilitation  SUBJECTIVE:                                                                                                                                                                                             SUBJECTIVE STATEMENT: Pt reports that she is still experiencing elevated  BP, she is working with her PCP to get it better controlled and started taking losartan. Pt reports that she sees her PCP again next week on 4/16 for a follow-up. Pt reports that she did take her BP this morning and it was 146/111 so she was worried about not being able to participate in therapy but this was her last scheduled appointment.  Pt reports some difficulty performing bridges from HEP and is asking about balance exercises she can do at home.   Pt accompanied by: self  PERTINENT HISTORY: Diagnosed with MS and Lupus in 2020:  per chart note  "38 y.o. female with past medical history significant for multiple sclerosis, lupus, GERD, cervical intraepithelial neoplasia, cholelithiasis who presented to [facility] ED Pt  on 1/28 with 2-week history of progressive generalized weakness, malaise, headache. Also endorsed nasal congestion, nausea, nosebleeds. Currently on Plaquenil and azathioprine for her lupus. Followed by neurology outpatient, Dr. Epimenio Foot but currently not on any additional disease modifying therapy for her MS currently. Sister also endorses that she has been having a hard time "gripping" items. She has been taking Excedrin without much relief regarding her headaches. Denies any urinary symptoms, no visual changes, no fever, no chills, no rigors, no muscle aches."  Brain lesion, migraines, rhabdomyolysis, tachycardia  PAIN: Pt reports no true pain  Are you having pain?  "Uncomfortable in my lower body area"  PRECAUTIONS: Fall  RED FLAGS: None   WEIGHT BEARING RESTRICTIONS: No  FALLS: Has patient fallen in last 6 months? Yes. Number of falls 1- inside her home - occurred late September  LIVING ENVIRONMENT: Lives with: lives with their family Lives in: House/apartment Stairs: Yes: External: 2 steps; on left going up Has following equipment at home: Walker - 4 wheeled, shower chair, and bed side commode  PLOF: Independent - pt has returned to work (accounting) - is  currently remotely from home but plans to return to office when able: is not driving currently   PATIENT GOALS: wants to improve balance and increase LE strength  OBJECTIVE:  Note: Objective measures were completed at Evaluation unless otherwise noted.  DIAGNOSTIC FINDINGS: MRI brain  with and without contrast with multifocal regions of cortical diffusion restriction associated with contrast-enhancement bilateral insular regions, right occipital lobe, left cerebellum.  Imaging findings concerning for inflammatory vs CNS vasculitis vs CVA vs lupus cerebritis vs tumefactive MS vs infection.  CT angiogram head/neck with no large vessel occlusion, no stenosis or other acute vascular abnormality, no evidence for vasculitis, multiple prominent cervical, axillary, mediastinal lymph nodes, few scattered pulmonary nodules.  MR T/L-spine with T2 hyperintense foci cervical spine cord C4-5, C6/7 consistent with known MS, no abnormal enhancement to suggest active demyelination, no abnormal spinal cord signal or enhancement in the thoracic spine, epidural fluid collection thoracic spine likely from recent LP.  Underwent lumbar puncture 02/24/2023 by IR with initial fluid analysis not consistent with infection; meningitis/encephalitis panel negative.  EEG with generalized and maximal right parieto-occipital slowing suggestive of cortical dysfunction likely secondary to underlying structural normality, no seizure/epileptiform discharges noted.  COGNITION: Overall cognitive status: Within functional limits for tasks assessed   SENSATION: Pt reports numbness & tingling in LE's - reports this was occurring prior to recent hospitalization  COORDINATION: WFL's    POSTURE: rounded shoulders and forward head  LOWER EXTREMITY ROM:   WFL's bil. LE's    LOWER EXTREMITY MMT:    MMT Right Eval Left Eval  Hip flexion 4 3+  Hip extension 3- 2+  Hip abduction    Hip adduction    Hip internal rotation    Hip  external rotation    Knee flexion 3+ 3  Knee extension 4 4  Ankle dorsiflexion 4 4  Ankle plantarflexion    Ankle inversion    Ankle eversion    (Blank rows = not tested)  BED MOBILITY:  Modified independent  TRANSFERS: Assistive device utilized: None  Sit to stand: Modified independence Stand to sit: Modified independence   STAIRS:  TBA   GAIT: Gait pattern: step through pattern Distance walked: 58' Assistive device utilized: Environmental consultant - 4 wheeled Level of assistance: Modified independence Comments: no device for TUG  FUNCTIONAL TESTS:  5 times sit to stand: 38.09 secs without UE support from mat Timed up and go (TUG): 29.69 secs without device 10 meter walk test: 18.72 secs = 1.75 ft/sec with rollator Pt stood for 1" unsupported with SBA                                                                                                                             TREATMENT:   Self-Care/Home Management: Vitals:   05/12/23 0806  BP: (!) 136/98  Pulse: 98  Seated BP assessed in LUE prior to intervention. BP elevated but within same parameters for participation in PT session.  TherAct: For LTG assessment:  [facility] PT Assessment - 05/12/23 0809       Ambulation/Gait   Gait velocity 32.8 ft over 13.2 sec = 2.48 ft/sec      Standardized Balance Assessment   Standardized Balance Assessment Timed Up and Go Test;Berg Balance  Test;Five Times Sit to Stand    Five times sit to stand comments  15.47 sec   no UE     Berg Balance Test   Sit to Stand Able to stand without using hands and stabilize independently    Standing Unsupported Able to stand safely 2 minutes    Sitting with Back Unsupported but Feet Supported on Floor or Stool Able to sit safely and securely 2 minutes    Stand to Sit Sits safely with minimal use of hands    Transfers Able to transfer safely, minor use of hands    Standing Unsupported with Eyes Closed Able to stand 10 seconds with supervision    Standing  Unsupported with Feet Together Able to place feet together independently and stand for 1 minute with supervision    From Standing, Reach Forward with Outstretched Arm Can reach confidently >25 cm (10")    From Standing Position, Pick up Object from Floor Able to pick up shoe safely and easily    From Standing Position, Turn to Look Behind Over each Shoulder Looks behind from both sides and weight shifts well    Turn 360 Degrees Able to turn 360 degrees safely in 4 seconds or less    Standing Unsupported, Alternately Place Feet on Step/Stool Able to stand independently and safely and complete 8 steps in 20 seconds    Standing Unsupported, One Foot in Front Able to plae foot ahead of the other independently and hold 30 seconds    Standing on One Leg Able to lift leg independently and hold equal to or more than 3 seconds    Total Score 51    Berg comment: 51/56, moderate fall risk      Timed Up and Go Test   TUG Normal TUG    Normal TUG (seconds) 12.75   no AD           To add to HEP to work on static balance in corner with chair in front of patient: EC with narrow BOS 3 x 30 sec no UE SLS with fingertip support 3 x 30 sec each B Added to HEP, see bolded below  Gait Gait pattern: decreased step length- Right, decreased step length- Left, decreased hip/knee flexion- Right, and decreased hip/knee flexion- Left Distance walked: 560 ft Assistive device utilized: None Level of assistance: Modified independence Comments: decreased speed with onset of fatigue   PATIENT EDUCATION: Education details: continue HEP, results of OM and functional implications, PT POC with plan to d/c this visit, continue to monitor BP and follow-up with PCP for management Person educated: Patient Education method: Explanation, Demonstration, and Handouts Education comprehension: verbalized understanding and returned demonstration  HOME EXERCISE PROGRAM: Access Code: [email or web address] URL:  [email or web address] Date: 03/16/2023 Prepared by: Maebelle Munroe  Exercises - Supine Bridge  - 1 x daily - 7 x weekly - 1 sets - 10 reps - 5 sec hold - Clamshell  - 1 x daily - 7 x weekly - 1 sets - 10 reps - 3-5 sec hold - Sit to Stand  - 1 x daily - 7 x weekly - 1 sets - 10 reps - Standing Hip Extension with Counter Support  - 1 x daily - 7 x weekly - 3 sets - 10 reps - Seated Hamstring Curl with Anchored Resistance  - 1 x daily - 7 x weekly - 1 sets - 10 reps - Hooklying Single Knee to Chest  - 1 x daily - 7 x weekly -  1 sets - 10 reps - Single Knee to Chest Stretch  - 1 x daily - 7 x weekly - 1 sets - 2 reps - 15 sec hold - Seated Knee Extension with Resistance  - 1 x daily - 7 x weekly - 1 sets - 10 reps - 5 sec hold hold - Romberg Stance with Eyes Closed  - 1 x daily - 7 x weekly - 1 sets - 3-5 reps - 30 sec hold - Standing Single Leg Stance with Counter Support  - 1 x daily - 7 x weekly - 1 sets - 3-5 reps - 30 sec hold  GOALS: Goals reviewed with patient? Yes  SHORT TERM GOALS: Target date: 04-17-23   Pt will increase Berg balance test score by at least 5 points to demo improved balance and reduced fall risk. Baseline: 44/56 Goal status: MET  2.  Improve TUG score to </= 24 secs without use of device to demo improved mobility and reduced fall risk. Baseline: 29.69 secs without device; 21.25 no AD Goal status: MET  3.  Improve 5x sit to stand score to </= 30 secs without UE support from high/low mat table. Baseline: 38.09 secs without UE support; 18.1s no UE Goal status: MET  4.  Increase gait velocity to >/= 2.2 ft/sec with rollator for increased gait efficiency. Baseline: 18.72 secs = 1.75 ft/sec with rollator; 1.64ft/s or .40m/s no AD Goal status: IN PROGRESS  5.  Independent in HEP for bil. LE strengthening. Baseline: independent  Goal status: MET  6.  Pt will ambulate 115' without device with SBA on flat, even surface.  Baseline: 115' + clinic  distances no AD- distant supervision Goal status: MET   LONG TERM GOALS: Target date: 05-15-23  Pt will increase Berg balance test score by at least 10 points to demo improved balance and reduced fall risk. Baseline: 51/56 (4/8) Goal status: MET  2.   Improve TUG score to </= 19 secs without use of device to demo improved mobility and reduced fall risk. Baseline: 29.69 secs without device, 12.75 sec no AD (4/8) Goal status: MET  3.   Improve 5x sit to stand score to </= 30 secs without UE support from high/low mat table. Baseline: 38.09 secs without UE support, 15.47 sec no UE (4/8) Goal status: MET  4.   Increase gait velocity to >/= 2.6 ft/sec with rollator for increased gait efficiency. Baseline: 18.72 secs = 1.75 ft/sec with rollator, 2.48 ft/sec no AD (4/8) Goal status: NOT MET  5.  Pt will ambulate 350' without device with SBA on flat, even surface.  Baseline: 560 ft no AD mod I (4/8) Goal status: MET  6.  Independent in updated HEP for bil. LE strengthening. Baseline:  Goal status: MET  ASSESSMENT:  CLINICAL IMPRESSION: Emphasis of skilled PT session on assessing LTG in preparation for d/c from OPPT services this date as well as continuing to assess BP and add balance exercises to HEP. Pt has met 5/6 LTG due to being in dependent with her final HEP, improving her Berg score to 51/56, improving her TUG score to 12.75 sec, improving her 5xSTS score to 15.47 sec, and being able to ambulate 560 ft with no AD and mod I level in doors. She also improved her gait speed from initial evaluation from 1.75 ft/sec to 2.48 ft/sec but did not quite meet the goal of 2.6 ft/sec. Overall she demonstrates great improvement in her function and exhibits a decreased fall risk. She  does continue to have hypertension but is working closely with her PCP on managing this. Pt to continue independently with her HEP and d/c from OPPT services at this time.    OBJECTIVE IMPAIRMENTS: Abnormal gait,  decreased balance, decreased endurance, decreased strength, and impaired tone.   ACTIVITY LIMITATIONS: carrying, lifting, bending, standing, squatting, stairs, transfers, and locomotion level  PARTICIPATION LIMITATIONS: meal prep, cleaning, laundry, driving, shopping, community activity, and occupation  PERSONAL FACTORS: Time since onset of injury/illness/exacerbation and 1-2 comorbidities: MS and Lupus  are also affecting patient's functional outcome.   REHAB POTENTIAL: Good  CLINICAL DECISION MAKING: Evolving/moderate complexity  EVALUATION COMPLEXITY: Moderate  PLAN: discharge from PT   Peter Congo, PT Peter Congo, PT, DPT, CSRS   05/12/2023, 10:30 AM

## 2023-05-12 NOTE — Therapy (Signed)
 OUTPATIENT OCCUPATIONAL THERAPY NEURO TREATMENT/DISCHARGE  Patient Name: Emily Hicks MRN: [id number] DOB:08-14-1985, 38 y.o., female Today's Date: 05/12/2023  PCP: None yet.  (Dr. Epimenio Foot neurologist) REFERRING PROVIDER: Jerald Kief, MD   OCCUPATIONAL THERAPY DISCHARGE SUMMARY  Visits from Start of Care: 8  Current functional level related to goals / functional outcomes: SEE BELOW - pt has met all goals   Remaining deficits: Balance   Education / Equipment: HEP's, safety and A/E recommendations   Patient agrees to discharge. Patient goals were met. Patient is being discharged due to meeting the stated rehab goals.     END OF SESSION:  OT End of Session - 05/12/23 0850     Visit Number 8    Number of Visits 9    Date for OT Re-Evaluation 05/14/23    Authorization Type [employer], VL: 20 each discipline, no auth required    OT Start Time 0848    OT Stop Time 0906    OT Time Calculation (min) 18 min    Activity Tolerance Patient tolerated treatment well    Behavior During Therapy WFL for tasks assessed/performed;Flat affect             Past Medical History:  Diagnosis Date   Cervical intraepithelial neoplasia III    Gallstones    GERD (gastroesophageal reflux disease)    only prn OTC occasionally   History of blood transfusion 2020   Lupus dx 2020   Multiple sclerosis (HCC) dx 2020   Numbness and tingling of left leg    Past Surgical History:  Procedure Laterality Date   CERVICAL CONIZATION W/BX N/A 01/16/2020   Procedure: CERVICAL CONE BIOPSY, ENDOCERVICAL CURETTAGE;  Surgeon: Theresia Majors, MD;  Location: [facility];  Service: Gynecology;  Laterality: N/A;  request 7:30am OR start in [place] Gyn block requests 45 minutes   CHOLECYSTECTOMY N/A 12/28/2013   Procedure: LAPAROSCOPIC CHOLECYSTECTOMY;  Surgeon: Claud Kelp, MD;  Location: [facility];  Service: General;  Laterality: N/A;   MUSCLE BIOPSY Right 11/06/2022   Procedure: RIGHT  THIGH MUSCLE BIOPSY;  Surgeon: Andria Meuse, MD;  Location: [facility];  Service: General;  Laterality: Right;   Patient Active Problem List   Diagnosis Date Noted   Rhabdomyolysis 11/05/2022   AKI (acute kidney injury) (HCC) 11/04/2022   Recurrent idiopathic pericarditis 08/04/2022   Complicated UTI (urinary tract infection) 06/16/2022   Chest pain 06/13/2021   Sepsis (HCC) 06/13/2021   Rash and nonspecific skin eruption 05/21/2020   CIN III (cervical intraepithelial neoplasia grade III) with severe dysplasia 02/09/2020   HSIL (high grade squamous intraepithelial lesion) on Pap smear of cervix 11/03/2019   Cervical cancer screening 09/30/2019   Essential hypertension 09/19/2018   SIRS (systemic inflammatory response syndrome) (HCC) 08/16/2018   SLE (systemic lupus erythematosus related syndrome) (HCC) 08/16/2018   Pancytopenia (HCC)    Anxiety and depression    Elevated liver enzymes 08/02/2018   Normocytic anemia    Tachycardia 07/16/2018   Multiple sclerosis (HCC) 03/31/2018   Common migraine 08/26/2017   Chondromalacia, patella, left 06/24/2017   Healthcare maintenance 12/20/2015   Contraceptive management 10/11/2014   Hidradenitis suppurativa of left axilla 06/07/2013   Dysplasia of cervix, low grade (CIN 1) 07/03/2011   Obesity, Class I, BMI 30.0-34.9 (see actual BMI) 05/14/2011    ONSET DATE: 03/02/2023 (referral date)   REFERRING DIAG: G93.9 (ICD-10-CM) - Brain lesion G35 (ICD-10-CM) - Multiple sclerosis (HCC)  THERAPY DIAG:  Unsteadiness on feet  Muscle weakness (generalized)  Other lack of coordination  Other disturbances of skin sensation  Rationale for Evaluation and Treatment: Rehabilitation  SUBJECTIVE:   SUBJECTIVE STATEMENT: No pain and no falls.  Pt accompanied by: self  PERTINENT HISTORY: Hospital ED on 1/20 with 2-week history of progressive generalized weakness, malaise, headache. Transferred to [facility] 1/20 for neurological work up. MRI reveals  Multifocal regions of predominantly cortical diffusion restriction with associated contrast enhancement, involving the bilateral insular regions, right occipital lobe, and left cerebellum. EEG suggestive cortical dysfunction arising from right parieto-occipital likely secondary to underlying structural abnormality. 1/21 underwent LP  PMH: multiple sclerosis (diagnosed 2020), lupus (diagnosed 2020), GERD, cervical intraepithelial neoplasia, cholelithiasis   Imaging findings concerning for inflammatory vs CNS vasculitis vs CVA vs lupus cerebritis vs tumefactive MS vs infection  PRECAUTIONS: Fall  WEIGHT BEARING RESTRICTIONS: No  PAIN:  Are you having pain?  Pt reports she is uncomfortable in lower back and Lt knee  FALLS: Has patient fallen in last 6 months? Yes. 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  HAND DOMINANCE: Right  ADLs: Transfers/ambulation related to ADLs: using rollator currently but only needs with exacerbations Eating: mod I  Grooming: mod I (fatigues with styling hair)  UB Dressing: slower but mod I  LB Dressing: slower but mod I  Toileting: mod I - difficulty w/ lower toilets Bathing: mod I with shower chair Tub Shower transfers: mod I  Equipment: Shower seat with back and 1 small grab bar  Pt back to work full time and reports typing is doing fine  IADLs: Shopping: when pt is doing well, pt does. Husband has been doing since last October Light housekeeping: pt does light cleaning normally but hasn't since October Meal Prep: light cooking but not since  October Community mobility: pt currently not driving since January hospitalization Medication management: mod I w/ pillbox (pt loads)  Financial management: mod I  Handwriting: 100% legible and Mild micrographia (normal for her)  MOBILITY STATUS:  using rollator currently since last exacerbation   ACTIVITY TOLERANCE: Activity tolerance: fatigues easily (worse since October)   UPPER EXTREMITY ROM:  BUE AROM WFL's (but more difficulty in high flexion)  UPPER EXTREMITY MMT:   RUE MMT 4/5, LUE MMT 3+/5 grossly   HAND FUNCTION: Grip strength: Right: 28  lbs; Left: 20 lbs  COORDINATION: 9 Hole Peg test: Right: 30.79 sec; Left: 32.55 sec (? Mild apraxia) 05/12/23: Rt = 21 sec, Lt = 24 sec.   SENSATION: Pins and needles sensation entire Lt side  EDEMA: none in Ue's but some in both feet/ankles  MUSCLE TONE: not tested  COGNITION: Overall cognitive status:  slower processing   VISION: Subjective report: Pt reports vision deficits has resolved - pt initially had visual deficits including Lt visual field loss and sensitivity to light Baseline vision: Wears glasses all the time Visual history:  none  VISION ASSESSMENT: Not tested   PERCEPTION: Not tested  PRAXIS: Not tested  OBSERVATIONS: Pt has not returned to IADLS since October exacerbation. However, pt has returned to work w/ no problems reported.  Overall fatigue and muscle weakness  TREATMENT DATE: 05/12/23  BP = 136/98  Assessed remaining goal but did not work on today secondary to diastolic number still elevated. Pt encouraged to continue to monitor BP and discuss with MD if medications need further adjustments, however overall BP has improved.   Pt reports she is doing all light IADLS at home and returned to cooking.   Pt shown cup holder to place on cane and provided handout  PATIENT  EDUCATION: See above  HOME EXERCISE PROGRAM: 03/24/23: coordination and putty HEP  03/31/23: sensory education 04/07/23: Bearl [place] HEP    GOALS: Goals reviewed with patient? Yes  SHORT TERM GOALS: Target date: 04/13/23  Independent with HEP for UE strength, grip strength, and high level coordination Baseline: Goal status: MET (may still need occasional cues for theraband)  2.  Pt to verbalize understanding with energy conservation techniques Baseline:  Goal status: MET  3.  Pt to verbalize understanding with possible A/E recommendations and task modifications prn to increase ease, safety, and independence with IADLS Baseline:  Goal status: MET   4.  Pt to perform environmental scanning with simple physical task with 90% accuracy in prep for potential return to driving Baseline:  Goal status: MET 04/21/23 (100% accuracy)   LONG TERM GOALS: Target date: 05/14/23:   Pt to return to light cooking at distant supervision level Baseline:  Goal status: MET  2.  Pt to return to light cleaning tasks at home at mod I level Baseline:  Goal status: MET (washing dishes, laundry)  3.  Pt to improve grip strength bilaterally by 5 lbs or more to open tight containers/jars w/ greater ease Baseline: Rt = 28 lbs, Lt = 20 lbs Goal status: MET (04/21/23: Rt = 52.2 lbs, 49.3 lbs)    ASSESSMENT:  CLINICAL IMPRESSION: Patient has met all STG's and LTGs at this time. Pt has no further concerns  PERFORMANCE DEFICITS: in functional skills including ADLs, IADLs, coordination, sensation, strength, Fine motor control, mobility, balance, endurance, decreased knowledge of use of DME, and UE functional use, cognitive skills including problem solving and safety awareness, and psychosocial skills including coping strategies.   IMPAIRMENTS: are limiting patient from ADLs, IADLs, and work.   CO-MORBIDITIES: has co-morbidities such as lupus  that affects occupational performance. Patient will benefit from  skilled OT to address above impairments and improve overall function.  MODIFICATION OR ASSISTANCE TO COMPLETE EVALUATION: No modification of tasks or assist necessary to complete an evaluation.  OT OCCUPATIONAL PROFILE AND HISTORY: Detailed assessment: Review of records and additional review of physical, cognitive, psychosocial history related to current functional performance.  CLINICAL DECISION MAKING: Moderate - several treatment options, min-mod task modification necessary  REHAB POTENTIAL: Good  EVALUATION COMPLEXITY: Moderate    PLAN:  OT FREQUENCY: 1-2x/week  OT DURATION: 8 weeks  PLANNED INTERVENTIONS: 97535 self care/ADL training, 40981 therapeutic exercise, 97530 therapeutic activity, 97112 neuromuscular re-education, 97140 manual therapy, 97039 fluidotherapy, passive range of motion, functional mobility training, visual/perceptual remediation/compensation, energy conservation, coping strategies training, patient/family education, and DME and/or AE instructions  RECOMMENDED OTHER SERVICES: none at this time  CONSULTED AND AGREED WITH PLAN OF CARE: Patient  PLAN  D/C O.T.   Sheran Lawless, OT 05/12/2023, 9:10 AM

## 2023-05-25 LAB — MTB-RIF NAA W AFB CULT, NON-SPUTUM
Acid Fast Culture: NEGATIVE
M tuberculosis complex: NOT DETECTED

## 2023-06-23 ENCOUNTER — Ambulatory Visit: Payer: 59 | Admitting: Neurology

## 2023-06-23 ENCOUNTER — Encounter: Payer: Self-pay | Admitting: Neurology

## 2023-06-23 VITALS — BP 153/91 | HR 100 | Ht 61.0 in | Wt 210.8 lb

## 2023-06-23 DIAGNOSIS — G35A Relapsing-remitting multiple sclerosis: Secondary | ICD-10-CM

## 2023-06-23 DIAGNOSIS — G35 Multiple sclerosis: Secondary | ICD-10-CM | POA: Diagnosis not present

## 2023-06-23 DIAGNOSIS — Z79899 Other long term (current) drug therapy: Secondary | ICD-10-CM

## 2023-06-23 DIAGNOSIS — R269 Unspecified abnormalities of gait and mobility: Secondary | ICD-10-CM | POA: Diagnosis not present

## 2023-06-23 DIAGNOSIS — I776 Arteritis, unspecified: Secondary | ICD-10-CM

## 2023-06-23 DIAGNOSIS — M329 Systemic lupus erythematosus, unspecified: Secondary | ICD-10-CM

## 2023-06-23 DIAGNOSIS — G35D Multiple sclerosis, unspecified: Secondary | ICD-10-CM

## 2023-06-23 NOTE — Progress Notes (Addendum)
 [facility]  PATIENT: Emily Hicks DOB: 02/05/1985  REFERRING DOCTOR OR PCP:  Dr. Chandra (PCP) SOURCE: patient, lab reports, MRI reports, multiple MRI images personally reviewed.  _________________________________   HISTORICAL  CHIEF COMPLAINT:  Chief Complaint  Patient presents with   Follow-up    Pt in room 11.alone. Here for MS follow up. Patient reports doing well, no falls, last eye exam: 04/2023. No concerns.     HISTORY OF PRESENT ILLNESS:  She is a 38 year old woman who was diagnosed with relapsing remitting MS in early 2020 but has had symptoms since 2010.   She also has SLE.  Update 06/23/2023: She was hospitalized in January 2025 after presenting with a couple weeks of generalized weakness, malaise, severe headache and reduced coordination in the hands and gait.  She also had some brain fog and developed left visual field cut.   MRI of the brain showed multifocal regions of cortical/subcortical restricted diffusion and increased T2/flair signal.  Some of the foci had subtle enhancement.  This was concerning for CNS vasculitis (has SLE) though demyelination would also be in the differential.   MRI April 2025 is markedly improved -essentially the same as before this recent event  She is currently on Imuran  150 mg daily, hydroxychloroquine  200 mg twice a day and prednisone  recently tapered offy.    She was on a similar regimen  before the hospitalization.     Not currently on a biologic but was on Saphnelo , an IFN-1 receptor antagonist IV in past.        She continues to do much better compared to the January event but she is not at baseline.  Balance is still off.       Around May 2024, she had a flare up of numbness, bladder issues and leg strength.  Gait is worse.   Her bladder issues developed with this flare-up.   This is mostly urgency though she has considerable hesitancy, and double voids/incomplete empying.   I saw her in June 2024.  Back in July  2024, we had rechecked MRI brain and spine and she had no new MS lesions.      She sees Dr. Curt [facility]) for her SLE.    MS Clinical course:   Around 2010, she had numbness/tingling in her left thigh and cramps in her toes.   While pregnant a little time later, she improved.   After her sone was born, the left leg leg symptoms returned.   She saw Dr. Onita and an MRI of the brain showed a few scattered foci but was mostly nonspecific.   She had more intense numbness in the left leg but also numbness in the left face and a repeat 2016 brain MRI showed a few more lesions.    Numbness symptoms did not improve but early 2020 she had more intense numbness and an MRI of the brain and cervical spine was performed.   She had 3-4 lesions in her cervical spine and the brian showed no definite less lesions.   An LP ws then performed and showed > 5 oligoclonal bands.   MS was diagnosed.    She started Aubagio  and took only one week before having a hospitalization for fever, hypernatremia, joint inflammation, thrush) and it was stopped.    First seen by Dr. Vear 09/08/2018.    SLE/CNS Vasculitis: In January 2025 she was admitted due to gait disturbance, visual field cut and weakness and found to have  multiple large lesions in the cerebral hemispheres and cerebellum not present a few months earlier on MRI.  She was treated with high-dose steroids.  Symptoms improved and the MRI appearance vastly improved.  This episode occurred while she was on Imuran  and Plaquenil  and low-dose steroids.   MR cervical spine 08/05/2022 showed  This MRI of the cervical spine with and without contrast shows the following: Subtle T2 hyperintense foci are noted anteriorly adjacent to C1-C2, anteriorly adjacent to C2, towards the left adjacent to's C4-C5 and towards the right adjacent to C6-C7. None of the foci enhance. Compared to the previous MRI from 2022, there do not appear to be any new lesions  No disc  degenerative changes.  No spinal stenosis or nerve root compression. Normal enhancement pattern.   MRI thoracic spine 08/05/2022 showed normal spinal cord  MRI brain 08/05/2022 showed Small number of T2/FLAIR hypertense foci in the cerebral hemispheres and 1 focus in the right upper pons in a pattern consistent with chronic demyelinating plaque associated with multiple sclerosis. None of the foci appear to be acute. Compared to previous MRIs, there were no new lesions.    MRI brain 05/16/2020 was unchanged compared to 2021  MRI cervical spine 05/16/2020 showed  no definite new lesions (one possible new lesion had been seen on a prior MRI). There is a subtle T2 hyperintense focus posterolaterally to the right adjacent to C2. There is a focus toward the left adjacent to C4-C5 also noted on the previous MRIs. These were present, and actually more apparent, on the MRI from 05/24/2019. There is a subtle focus towards the right adjacent to C6-C7 seen more prominently on previous MRIs. There is a small focus towards the left C5-C6 seen on the sagittal images today but not noted on the4/20/2021 MRI. However, the focus does appear to be subtle on the 03/24/2018 MRI.SABRA     MRI brain 05/24/19 shows foci in the hemispheres and in the right cerebral peduncle consistent with MS.  No changes compared to 07/27/2018.  Normal enhancement pattern. MRI cervical spine 05/24/19 shows T2 hyperintense foci to the right adjacent to C2, to the left adjacent to C4-C5, towards the left adjacent to C5 and towards the right adjacent to C6-C7.  No change compared to the 2020 MRI. MRI brain 07/27/18: Formal report was Normal - does show a few small T2/Flair lesions, 2 periventricular and the rest deep white matter MRI cervical spine 03/24/18 shows 3-4 lesions (C4 left, C5 left and C7 right also C2 on sagittal only) c/w demyelinating plaque. MRI brain 03/25/18:  Some scattered T2/Flair foci in hemispheres 1-2 perventricular MRI lumbar 08/02/18:   Normal  11/15/2014: By the report:   6 periventricular and subcortical foci - 1 new focus in left posterior temporal white matter and 2 punctate left frontal lobe lesions compared to 2011.   Other lesions stable.  I also see a right cerebral peduncle lesion that may explain her symptoms   MS and other Lab Studies:  CSF showed > 5 OCB and increased IgG index (1.57) 04/16/2018.   Positive ANA with increased dsDNA, ENARNP, ENA SM, SSA.   ESR = 27, Vit D low (15-17)  Other medical problems: She has SLE and is on Plaquenil  (Dr. Curt [phone])  She was hospitalized for SLE, hypernatremia, elevated LFTs and thrush 07/27/18,   Of note LFTs were normal 09/08/2018.   She did take Aubagio  x 1 week before she was hospitalized.     REVIEW OF SYSTEMS: Constitutional:  No fevers, chills, sweats, or change in appetite Eyes: No visual changes, double vision, eye pain Ear, nose and throat: No hearing loss, ear pain, nasal congestion, sore throat Cardiovascular: No chest pain, palpitations Respiratory:  No shortness of breath at rest or with exertion.   No wheezes GastrointestinaI: No nausea, vomiting, diarrhea, abdominal pain, fecal incontinence Genitourinary:  No dysuria, urinary retention or frequency.  No nocturia. Musculoskeletal:  No neck pain, back pain Integumentary: No rash, pruritus, skin lesions Neurological: as above Psychiatric: No depression at this time.  No anxiety Endocrine: No palpitations, diaphoresis, change in appetite, change in weigh or increased thirst Hematologic/Lymphatic:  No anemia, purpura, petechiae. Allergic/Immunologic: No itchy/runny eyes, nasal congestion, recent allergic reactions, rashes  ALLERGIES: No Known Allergies  HOME MEDICATIONS:  Current Outpatient Medications:    aspirin-acetaminophen -caffeine (EXCEDRIN MIGRAINE) 250-250-65 MG tablet, Take 2 tablets by mouth every 6 (six) hours as needed for headache or migraine., Disp: , Rfl:    azathioprine  (IMURAN ) 100  MG tablet, Take 150 mg by mouth daily., Disp: , Rfl:    clobetasol  ointment (TEMOVATE ) 0.05 %, Apply 1 application  topically daily as needed (rash)., Disp: , Rfl:    hydroxychloroquine  (PLAQUENIL ) 200 MG tablet, Take 200 mg by mouth 2 (two) times daily., Disp: , Rfl:    ketoconazole  (NIZORAL ) 2 % shampoo, Apply 1 Application topically once a week., Disp: , Rfl:    solifenacin  (VESICARE ) 10 MG tablet, Take 1 tablet (10 mg total) by mouth daily., Disp: 30 tablet, Rfl: 11   TYLENOL  325 MG CAPS, Take 650 mg by mouth every 6 (six) hours as needed (for headaches or pain)., Disp: , Rfl:    amLODipine  (NORVASC ) 10 MG tablet, Take 10 mg by mouth daily., Disp: , Rfl:    losartan  (COZAAR ) 25 MG tablet, Take 25 mg by mouth daily., Disp: , Rfl:    metoprolol  succinate (TOPROL -XL) 50 MG 24 hr tablet, Take 1 tablet (50 mg total) by mouth at bedtime. Take with or immediately following a meal., Disp: 30 tablet, Rfl: 0   mupirocin  ointment (BACTROBAN ) 2 %, Apply 1 Application topically 2 (two) times daily., Disp: 22 g, Rfl: 0   Ublituximab-xiiy (BRIUMVI IV), Inject into the vein., Disp: , Rfl:   Current Facility-Administered Medications:    medroxyPROGESTERone  (DEPO-PROVERA ) injection 150 mg, 150 mg, Intramuscular, Q90 days, McDiarmid, Krystal BIRCH, MD, 150 mg at 03/18/21 1435  PAST MEDICAL HISTORY: Past Medical History:  Diagnosis Date   Cervical intraepithelial neoplasia III    Gallstones    GERD (gastroesophageal reflux disease)    only prn OTC occasionally   History of blood transfusion 2020   Lupus dx 2020   Multiple sclerosis dx 2020   Numbness and tingling of left leg     PAST SURGICAL HISTORY: Past Surgical History:  Procedure Laterality Date   CERVICAL CONIZATION W/BX N/A 01/16/2020   Procedure: CERVICAL CONE BIOPSY, ENDOCERVICAL CURETTAGE;  Surgeon: Arnaldo Purchase, MD;  Location: [facility];  Service: Gynecology;  Laterality: N/A;  request 7:30am OR start in [place] [facility]  block requests 45 minutes   CHOLECYSTECTOMY N/A 12/28/2013   Procedure: LAPAROSCOPIC CHOLECYSTECTOMY;  Surgeon: Elon Pacini, MD;  Location: [facility];  Service: General;  Laterality: N/A;   MUSCLE BIOPSY Right 11/06/2022   Procedure: RIGHT THIGH MUSCLE BIOPSY;  Surgeon: Teresa Lonni HERO, MD;  Location: [facility];  Service: General;  Laterality: Right;    FAMILY HISTORY: Family History  Problem Relation Age of Onset   Hypertension Mother  Hypertension Father    Hypertension Sister    Diabetes Maternal Grandmother    Diabetes Maternal Grandfather    Multiple sclerosis Cousin    Lupus Cousin    Lupus Cousin     SOCIAL HISTORY:  Social History   Socioeconomic History   Marital status: Married    Spouse name: Not on file   Number of children: 1   Years of education: 16   Highest education level: Bachelor's degree (e.g., BA, AB, BS)  Occupational History   Occupation: Accounting  Tobacco Use   Smoking status: Never   Smokeless tobacco: Never  Vaping Use   Vaping status: Never Used  Substance and Sexual Activity   Alcohol use: No   Drug use: No   Sexual activity: Yes    Partners: Male    Birth control/protection: None    Comment: 1st intercourse 38 yo-Fewer than 5 partners  Other Topics Concern   Not on file  Social History Narrative   Lives at home with husband and son.   Right-handed   Drinks some Tea   Pt works    Museum/gallery Exhibitions Officer: Not on file  Food Insecurity: No Food Insecurity (02/23/2023)   Hunger Vital Sign    Worried About Running Out of Food in the Last Year: Never true    Ran Out of Food in the Last Year: Never true  Transportation Needs: No Transportation Needs (02/23/2023)   PRAPARE - Administrator, Civil Service (Medical): No    Lack of Transportation (Non-Medical): No  Physical Activity: Not on file  Stress: Not on file  Social Connections: Not on file  Intimate Partner Violence: Not At Risk  (02/23/2023)   Humiliation, Afraid, Rape, and Kick questionnaire    Fear of Current or Ex-Partner: No    Emotionally Abused: No    Physically Abused: No    Sexually Abused: No     PHYSICAL EXAM  Vitals:   06/23/23 1539  BP: (!) 153/91  Pulse: 100  Weight: 210 lb 12.8 oz (95.6 kg)  Height: 5' 1 (1.549 m)      Body mass index is 39.83 kg/m.   General: The patient is well-developed and well-nourished and in no acute distress   Skin: Extremities are without rash or  edema.  Musculoskeletal: She has tenderness with palpation of the proximal leg and shoulder/chest muscles   Neurologic Exam  Mental status: The patient is alert and oriented x 3 at the time of the examination. The patient has apparent normal recent and remote memory, with an apparently normal attention span and concentration ability.   Speech is normal.  Cranial nerves: Extraocular movements are full.  Facial strength and sensation is normal..  Trapezius and sternocleidomastoid strength is normal. No dysarthria is noted.   No obvious hearing deficits are noted.  Motor:  Muscle bulk is normal.   Tone is normal. Strength is  4+/5 iliopsoas muscles but o/w 5/5 in legs  Sensory: Sensory testing shows reduced sensation to touch and vibration on right arm and leg.    Coordination: Cerebellar testing reveals good finger-nose-finger and now normal heel-to-shin bilaterally.  Gait and station: The station is normal.  Gait is improved and she is walking without a cane.  She is still mildly ataxic with a 4 step 180 degree turn.    Tandem gait was poor.  Romberg is borderline.  Reflexes: Deep tendon reflexes are symmetric and normal in arms and  3+ at knees.   No ankle clonus     DIAGNOSTIC DATA (LABS, IMAGING, TESTING) - I reviewed patient records, labs, notes, testing and imaging myself where available.  Lab Results  Component Value Date   WBC 8.1 06/23/2023   HGB 12.4 06/23/2023   HCT 38.6 06/23/2023   MCV 91  06/23/2023   PLT 302 06/23/2023      Component Value Date/Time   NA 142 03/30/2023 1316   K 4.2 03/30/2023 1316   CL 105 03/30/2023 1316   CO2 23 03/30/2023 1316   GLUCOSE 68 (L) 03/30/2023 1316   GLUCOSE 119 (H) 03/03/2023 0522   BUN 23 (H) 03/30/2023 1316   CREATININE 0.70 03/30/2023 1316   CREATININE 0.71 10/29/2010 0934   CALCIUM  9.4 06/24/2023 0000   PROT 6.5 06/23/2023 1706   ALBUMIN  3.9 06/23/2023 1706   ALBUMIN  4.0 04/16/2018 1225   AST 12 06/23/2023 1706   ALT 10 06/23/2023 1706   ALKPHOS 76 06/23/2023 1706   BILITOT 0.3 06/23/2023 1706   GFRNONAA 58 (L) 03/03/2023 0522   GFRAA 127 09/29/2019 1035   Lab Results  Component Value Date   CHOL 126 08/15/2021   HDL 49 08/15/2021   LDLCALC 59 08/15/2021   TRIG 93 08/15/2021   CHOLHDL 2.6 08/15/2021   Lab Results  Component Value Date   HGBA1C 5.5 02/24/2023   Lab Results  Component Value Date   VITAMINB12 309 11/05/2022   Lab Results  Component Value Date   TSH 1.466 02/24/2023       ASSESSMENT AND PLAN  1. Multiple sclerosis, relapsing-remitting   2. Multiple sclerosis   3. High risk medication use   4. Gait disturbance   5. CNS vasculitis   6. SLE (systemic lupus erythematosus related syndrome) (HCC)       1.   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  She had a very abnormal brain MRI with multiple cortical/subcortical large foci with mild enhancement.  The appearance was most worrisome for CNS vasculitis, related to her SLE.  The appearance would not be typical for MS. As she has improved, infection or malignancy is unlikely.  She continues on her medications for lupus including azathioprine , hydroxychloroquine  and prednisone .  The follow-up MRI was markedly improved, similar to the appearance of May 2024.  I am concerned that the episode occurred while she was on Imuran  and Plaquenil .  Therefore, I would like to change her to an anti-CD20 for her MS.  I  discussed this with her rheumatologist and we are in agreement.  We will try to reduce the Imuran  dose once she is initiated on Briumvi or Ocrevus.  Lab work will be checked for chronic infections (Quantiferon TB and HIV were - January 2025 so do not have to be checked again, varicella IgG was positive in the past)  2.   Stay active and exercise as tolerated. 3.   Vit D supps - 5000 units daily 4.   She will return to see me in 6 months but sooner based on a treatment decision.  45-minute office visit with the majority of the time spent face-to-face for history and physical, discussion/counseling and decision-making.  I discussed her case by phone with rheumatology (Dr. Curt).  Additional time with record review and documentation.   Akashdeep Chuba A. Vear, MD, [facility] 12/28/2023, 5:27 PM Certified in Neurology, Clinical Neurophysiology, Sleep Medicine and Neuroimaging  [facility] [address]  [place], [place] [address] [phone]

## 2023-06-24 ENCOUNTER — Ambulatory Visit: Payer: Self-pay | Admitting: Neurology

## 2023-06-24 LAB — CBC WITH DIFFERENTIAL/PLATELET
Basophils Absolute: 0 10*3/uL (ref 0.0–0.2)
Basos: 0 %
EOS (ABSOLUTE): 0 10*3/uL (ref 0.0–0.4)
Eos: 0 %
Hematocrit: 38.6 % (ref 34.0–46.6)
Hemoglobin: 12.4 g/dL (ref 11.1–15.9)
Immature Grans (Abs): 0.1 10*3/uL (ref 0.0–0.1)
Immature Granulocytes: 2 %
Lymphocytes Absolute: 0.6 10*3/uL — ABNORMAL LOW (ref 0.7–3.1)
Lymphs: 7 %
MCH: 29.2 pg (ref 26.6–33.0)
MCHC: 32.1 g/dL (ref 31.5–35.7)
MCV: 91 fL (ref 79–97)
Monocytes Absolute: 0.4 10*3/uL (ref 0.1–0.9)
Monocytes: 5 %
Neutrophils Absolute: 7 10*3/uL (ref 1.4–7.0)
Neutrophils: 86 %
Platelets: 302 10*3/uL (ref 150–450)
RBC: 4.24 x10E6/uL (ref 3.77–5.28)
RDW: 13.3 % (ref 11.7–15.4)
WBC: 8.1 10*3/uL (ref 3.4–10.8)

## 2023-06-24 LAB — HEPATITIS B SURFACE ANTIGEN: Hepatitis B Surface Ag: NEGATIVE

## 2023-06-24 LAB — IGG, IGA, IGM
IgA/Immunoglobulin A, Serum: 366 mg/dL — ABNORMAL HIGH (ref 87–352)
IgG (Immunoglobin G), Serum: 1016 mg/dL (ref 586–1602)
IgM (Immunoglobulin M), Srm: 106 mg/dL (ref 26–217)

## 2023-06-24 LAB — HEPATIC FUNCTION PANEL
ALT: 10 IU/L (ref 0–32)
AST: 12 IU/L (ref 0–40)
Albumin: 3.9 g/dL (ref 3.9–4.9)
Alkaline Phosphatase: 76 IU/L (ref 44–121)
Bilirubin Total: 0.3 mg/dL (ref 0.0–1.2)
Bilirubin, Direct: 0.14 mg/dL (ref 0.00–0.40)
Total Protein: 6.5 g/dL (ref 6.0–8.5)

## 2023-06-24 LAB — HEPATITIS B CORE ANTIBODY, TOTAL: Hep B Core Total Ab: NEGATIVE

## 2023-06-25 LAB — LAB REPORT - SCANNED
Albumin, Urine POC: 221.6
Calcium: 9.4
Creatinine, POC: 131.3 mg/dL
EGFR: 103
Microalb Creat Ratio: 169
PTH: 58

## 2023-07-07 ENCOUNTER — Telehealth: Payer: Self-pay | Admitting: *Deleted

## 2023-07-07 NOTE — Telephone Encounter (Signed)
 Emily Hicks

## 2023-08-04 NOTE — Telephone Encounter (Signed)
 I've checked with [facility] in [place] regarding the status of this patient but have not heard back yet.

## 2023-08-05 NOTE — Telephone Encounter (Signed)
 Silvano reports that patient's dates are 08/11/2023 & 08/25/2023 for Briumvi infusions.

## 2023-08-20 ENCOUNTER — Ambulatory Visit: Payer: Self-pay | Admitting: Nurse Practitioner

## 2023-09-06 ENCOUNTER — Other Ambulatory Visit: Payer: Self-pay | Admitting: Internal Medicine

## 2023-10-12 ENCOUNTER — Ambulatory Visit: Payer: PRIVATE HEALTH INSURANCE | Admitting: Nurse Practitioner

## 2023-10-12 NOTE — Progress Notes (Deleted)
 CITLALI GAUTNEY 02/12/1985 [id number]   History:  38 y.o. G1P0001 presents for annual exam. Stopped Depo fall 2023. LMP in April 2024. Not sexually active due to health but will plan to restart contraception later. 01/16/2020 cold knife conization and endocervical curettage for severe cervical dysplasia/CIN 3/carcinoma in situ on biopsy. All margins negative for high-grade dysplasia, low-grade dysplasia present at ectocervical margin in all 4 quadrants, no evidence of invasive carcinoma. Pap 08/08/2020 normal cytology positive HR HPV, 12/2020 normal neg HPV. H/O Lupus, MS.   Gynecologic History No LMP recorded.   Contraception/Family planning: coitus interruptus Sexually active: No  Health Maintenance Last Pap: 08/06/2022. Results were: Normal neg HPV, 1-year repeat Last mammogram: Not indicated Last colonoscopy: Not indicated Last Dexa: Not indicated      08/15/2021    2:02 PM  Depression screen PHQ 2/9  Decreased Interest 0  Down, Depressed, Hopeless 0  PHQ - 2 Score 0  Altered sleeping 0  Tired, decreased energy 0  Change in appetite 0  Feeling bad or failure about yourself  0  Trouble concentrating 0  Moving slowly or fidgety/restless 0  Suicidal thoughts 0  PHQ-9 Score 0     Past medical history, past surgical history, family history and social history were all reviewed and documented in the EPIC chart. Married. Works at [employer] 21 yo son.   ROS:  A ROS was performed and pertinent positives and negatives are included.  Exam:  There were no vitals filed for this visit.   There is no height or weight on file to calculate BMI.  General appearance:  Normal Thyroid :  Symmetrical, normal in size, without palpable masses or nodularity. Respiratory  Auscultation:  Clear without wheezing or rhonchi Cardiovascular  Auscultation:  Regular rate, without rubs, murmurs or gallops  Edema/varicosities:  Not grossly evident Abdominal  Soft,nontender, without  masses, guarding or rebound.  Liver/spleen:  No organomegaly noted  Hernia:  None appreciated  Skin  Inspection:  Grossly normal Breasts: Examined lying and sitting.   Right: Without masses, retractions, nipple discharge or axillary adenopathy.   Left: Without masses, retractions, nipple discharge or axillary adenopathy. Pelvic: External genitalia:  no lesions              Urethra:  normal appearing urethra with no masses, tenderness or lesions              Bartholins and Skenes: normal                 Vagina: normal appearing vagina with normal color and discharge, no lesions              Cervix: no lesions Bimanual Exam:  Uterus:  no masses or tenderness              Adnexa: no mass, fullness, tenderness              Rectovaginal: Deferred              Anus:  normal, no lesions   Assessment/Plan:  38 y.o. G1P0001 for annual exam.   Well female exam with routine gynecological exam - Education provided on SBEs, importance of preventative screenings, current guidelines, high calcium  diet, regular exercise, and multivitamin daily. Labs done elsewhere.    Well female exam with routine gynecological exam  Depression screening  Cervical cancer screening See HPI for pap history. If normal can return to 3-year interval per guidelines.    No follow-ups on file.  Annabella DELENA Shutter DNP, 9:20 AM 10/12/2023

## 2023-12-22 NOTE — Progress Notes (Unsigned)
 Emily Hicks [birth year] [id number]   History:  38 y.o. G1P0001 presents for annual exam. Stopped Depo fall 2023. LMP in April 2024. Not sexually active due to health but will plan to restart contraception later. 01/16/2020 cold knife conization and endocervical curettage for severe cervical dysplasia/CIN 3/carcinoma in situ on biopsy. All margins negative for high-grade dysplasia, low-grade dysplasia present at ectocervical margin in all 4 quadrants, no evidence of invasive carcinoma. Pap 08/08/2020 normal cytology positive HR HPV, 12/2020 normal neg HPV. History of Lupus, MS.    Gynecologic History No LMP recorded.   Contraception/Family planning: coitus interruptus Sexually active: No  Health Maintenance Last Pap: 08/06/2022. Results were: Normal neg HPV, 1-year repeat Last mammogram: Not indicated Last colonoscopy: Not indicated Last Dexa: Not indicated      08/15/2021    2:02 PM  Depression screen PHQ 2/9  Decreased Interest 0  Down, Depressed, Hopeless 0  PHQ - 2 Score 0  Altered sleeping 0  Tired, decreased energy 0  Change in appetite 0  Feeling bad or failure about yourself  0  Trouble concentrating 0  Moving slowly or fidgety/restless 0  Suicidal thoughts 0  PHQ-9 Score 0      Data saved with a previous flowsheet row definition     Past medical history, past surgical history, family history and social history were all reviewed and documented in the EPIC chart. Married. Works at [employer] 30 yo son.   ROS:  A ROS was performed and pertinent positives and negatives are included.  Exam:  There were no vitals filed for this visit.   There is no height or weight on file to calculate BMI.  General appearance:  Normal Thyroid :  Symmetrical, normal in size, without palpable masses or nodularity. Respiratory  Auscultation:  Clear without wheezing or rhonchi Cardiovascular  Auscultation:  Regular rate, without rubs, murmurs or  gallops  Edema/varicosities:  Not grossly evident Abdominal  Soft,nontender, without masses, guarding or rebound.  Liver/spleen:  No organomegaly noted  Hernia:  None appreciated  Skin  Inspection:  Grossly normal Breasts: Examined lying and sitting.   Right: Without masses, retractions, nipple discharge or axillary adenopathy.   Left: Without masses, retractions, nipple discharge or axillary adenopathy. Pelvic: External genitalia:  no lesions              Urethra:  normal appearing urethra with no masses, tenderness or lesions              Bartholins and Skenes: normal                 Vagina: normal appearing vagina with normal color and discharge, no lesions              Cervix: no lesions Bimanual Exam:  Uterus:  no masses or tenderness              Adnexa: no mass, fullness, tenderness              Rectovaginal: Deferred              Anus:  normal, no lesions   Assessment/Plan:  37 y.o. G1P0001 for annual exam.   Well female exam with routine gynecological exam - Education provided on SBEs, importance of preventative screenings, current guidelines, high calcium  diet, regular exercise, and multivitamin daily. Labs done elsewhere.    Screening for cervical cancer - Plan: Cytology - PAP( [place]). See HPI for pap history.   No follow-ups on  file.     Annabella DELENA Shutter DNP, 12:50 PM 12/22/2023

## 2023-12-23 ENCOUNTER — Ambulatory Visit (INDEPENDENT_AMBULATORY_CARE_PROVIDER_SITE_OTHER): Payer: PRIVATE HEALTH INSURANCE | Admitting: Nurse Practitioner

## 2023-12-23 ENCOUNTER — Encounter: Payer: Self-pay | Admitting: Nurse Practitioner

## 2023-12-23 ENCOUNTER — Other Ambulatory Visit (HOSPITAL_COMMUNITY)
Admission: RE | Admit: 2023-12-23 | Discharge: 2023-12-23 | Disposition: A | Discharge status: Home / Self Care | Source: Ambulatory Visit | Attending: Nurse Practitioner | Admitting: Nurse Practitioner

## 2023-12-23 VITALS — BP 112/78 | HR 70 | Resp 16 | Ht 61.75 in | Wt 209.0 lb

## 2023-12-23 DIAGNOSIS — Z01419 Encounter for gynecological examination (general) (routine) without abnormal findings: Secondary | ICD-10-CM

## 2023-12-23 DIAGNOSIS — N764 Abscess of vulva: Secondary | ICD-10-CM | POA: Diagnosis not present

## 2023-12-23 DIAGNOSIS — Z124 Encounter for screening for malignant neoplasm of cervix: Secondary | ICD-10-CM | POA: Insufficient documentation

## 2023-12-23 DIAGNOSIS — Z1331 Encounter for screening for depression: Secondary | ICD-10-CM

## 2023-12-23 MED ORDER — MUPIROCIN 2 % EX OINT
1.0000 | TOPICAL_OINTMENT | Freq: Two times a day (BID) | CUTANEOUS | 0 refills | Status: AC
Start: 2023-12-23 — End: ?

## 2023-12-25 ENCOUNTER — Ambulatory Visit: Payer: Self-pay | Admitting: Nurse Practitioner

## 2023-12-25 LAB — CYTOLOGY - PAP
Comment: NEGATIVE
Diagnosis: NEGATIVE
High risk HPV: NEGATIVE

## 2023-12-28 ENCOUNTER — Encounter: Payer: Self-pay | Admitting: Neurology

## 2024-02-03 ENCOUNTER — Encounter: Payer: Self-pay | Admitting: Nurse Practitioner

## 2024-02-03 ENCOUNTER — Other Ambulatory Visit: Payer: Self-pay | Admitting: Nurse Practitioner

## 2024-02-03 DIAGNOSIS — N764 Abscess of vulva: Secondary | ICD-10-CM

## 2024-02-03 MED ORDER — DOXYCYCLINE MONOHYDRATE 100 MG PO CAPS: 100.0000 mg | ORAL_CAPSULE | Freq: Two times a day (BID) | ORAL | 0 refills | Status: AC
